# Patient Record
Sex: Female | Born: 1950 | Race: Black or African American | Hispanic: No | Marital: Single | Smoking: Former smoker
Health system: Southern US, Community
[De-identification: ages and names within clinical notes are randomized; demographics above are authoritative.]

## PROBLEM LIST (undated history)

## (undated) DIAGNOSIS — K219 Gastro-esophageal reflux disease without esophagitis: Secondary | ICD-10-CM

## (undated) DIAGNOSIS — H2511 Age-related nuclear cataract, right eye: Secondary | ICD-10-CM

## (undated) DIAGNOSIS — I10 Essential (primary) hypertension: Secondary | ICD-10-CM

## (undated) DIAGNOSIS — Z720 Tobacco use: Secondary | ICD-10-CM

## (undated) DIAGNOSIS — E7800 Pure hypercholesterolemia, unspecified: Secondary | ICD-10-CM

## (undated) DIAGNOSIS — G629 Polyneuropathy, unspecified: Secondary | ICD-10-CM

## (undated) DIAGNOSIS — N189 Chronic kidney disease, unspecified: Secondary | ICD-10-CM

## (undated) DIAGNOSIS — K5790 Diverticulosis of intestine, part unspecified, without perforation or abscess without bleeding: Secondary | ICD-10-CM

## (undated) DIAGNOSIS — I96 Gangrene, not elsewhere classified: Secondary | ICD-10-CM

## (undated) DIAGNOSIS — I82409 Acute embolism and thrombosis of unspecified deep veins of unspecified lower extremity: Secondary | ICD-10-CM

## (undated) DIAGNOSIS — Z959 Presence of cardiac and vascular implant and graft, unspecified: Secondary | ICD-10-CM

## (undated) DIAGNOSIS — C189 Malignant neoplasm of colon, unspecified: Secondary | ICD-10-CM

## (undated) DIAGNOSIS — C20 Malignant neoplasm of rectum: Secondary | ICD-10-CM

## (undated) DIAGNOSIS — I70229 Atherosclerosis of native arteries of extremities with rest pain, unspecified extremity: Secondary | ICD-10-CM

## (undated) DIAGNOSIS — D649 Anemia, unspecified: Secondary | ICD-10-CM

## (undated) DIAGNOSIS — E119 Type 2 diabetes mellitus without complications: Secondary | ICD-10-CM

## (undated) DIAGNOSIS — E039 Hypothyroidism, unspecified: Secondary | ICD-10-CM

## (undated) DIAGNOSIS — T7840XA Allergy, unspecified, initial encounter: Secondary | ICD-10-CM

## (undated) DIAGNOSIS — I739 Peripheral vascular disease, unspecified: Secondary | ICD-10-CM

## (undated) DIAGNOSIS — I998 Other disorder of circulatory system: Secondary | ICD-10-CM

## (undated) DIAGNOSIS — M1990 Unspecified osteoarthritis, unspecified site: Secondary | ICD-10-CM

## (undated) DIAGNOSIS — I509 Heart failure, unspecified: Secondary | ICD-10-CM

## (undated) DIAGNOSIS — D689 Coagulation defect, unspecified: Secondary | ICD-10-CM

## (undated) HISTORY — DX: Tobacco use: Z72.0

## (undated) HISTORY — DX: Allergy, unspecified, initial encounter: T78.40XA

## (undated) HISTORY — PX: HERNIA REPAIR: SHX51

## (undated) HISTORY — DX: Other disorder of circulatory system: I99.8

## (undated) HISTORY — DX: Atherosclerosis of native arteries of extremities with rest pain, unspecified extremity: I70.229

## (undated) HISTORY — PX: TOE SURGERY: SHX1073

## (undated) HISTORY — PX: COLONOSCOPY: SHX174

## (undated) HISTORY — DX: Malignant neoplasm of colon, unspecified: C18.9

## (undated) HISTORY — DX: Essential (primary) hypertension: I10

## (undated) HISTORY — PX: EYE SURGERY: SHX253

## (undated) HISTORY — DX: Coagulation defect, unspecified: D68.9

---

## 1997-04-30 ENCOUNTER — Other Ambulatory Visit: Admission: RE | Admit: 1997-04-30 | Discharge: 1997-04-30 | Payer: Self-pay | Admitting: Internal Medicine

## 1997-07-22 ENCOUNTER — Inpatient Hospital Stay (HOSPITAL_COMMUNITY): Admission: RE | Admit: 1997-07-22 | Discharge: 1997-07-24 | Payer: Self-pay

## 1998-01-10 HISTORY — PX: UMBILICAL HERNIA REPAIR: SHX196

## 1998-01-10 HISTORY — PX: ABDOMINAL HYSTERECTOMY: SHX81

## 1998-08-13 ENCOUNTER — Other Ambulatory Visit: Admission: RE | Admit: 1998-08-13 | Discharge: 1998-08-13 | Payer: Self-pay | Admitting: Obstetrics and Gynecology

## 1999-08-20 ENCOUNTER — Other Ambulatory Visit: Admission: RE | Admit: 1999-08-20 | Discharge: 1999-08-20 | Payer: Self-pay | Admitting: Obstetrics and Gynecology

## 2000-09-04 ENCOUNTER — Other Ambulatory Visit: Admission: RE | Admit: 2000-09-04 | Discharge: 2000-09-04 | Payer: Self-pay | Admitting: Obstetrics and Gynecology

## 2001-06-05 ENCOUNTER — Encounter: Admission: RE | Admit: 2001-06-05 | Discharge: 2001-09-03 | Payer: Self-pay | Admitting: Internal Medicine

## 2005-08-18 ENCOUNTER — Encounter: Admission: RE | Admit: 2005-08-18 | Discharge: 2005-08-18 | Payer: Self-pay | Admitting: Internal Medicine

## 2008-02-27 ENCOUNTER — Encounter: Admission: RE | Admit: 2008-02-27 | Discharge: 2008-02-27 | Payer: Self-pay | Admitting: Internal Medicine

## 2009-04-02 ENCOUNTER — Encounter: Admission: RE | Admit: 2009-04-02 | Discharge: 2009-04-02 | Payer: Self-pay | Admitting: Internal Medicine

## 2009-06-09 ENCOUNTER — Ambulatory Visit: Payer: Self-pay | Admitting: Vascular Surgery

## 2009-06-23 ENCOUNTER — Ambulatory Visit (HOSPITAL_COMMUNITY): Admission: RE | Admit: 2009-06-23 | Discharge: 2009-06-23 | Payer: Self-pay | Admitting: Surgery

## 2010-03-03 ENCOUNTER — Other Ambulatory Visit: Payer: Self-pay | Admitting: Internal Medicine

## 2010-03-03 DIAGNOSIS — Z1231 Encounter for screening mammogram for malignant neoplasm of breast: Secondary | ICD-10-CM

## 2010-03-29 LAB — POCT I-STAT, CHEM 8
BUN: 19 mg/dL (ref 6–23)
Calcium, Ion: 1.19 mmol/L (ref 1.12–1.32)
Chloride: 97 mEq/L (ref 96–112)
Creatinine, Ser: 0.8 mg/dL (ref 0.4–1.2)
Glucose, Bld: 507 mg/dL — ABNORMAL HIGH (ref 70–99)
HCT: 44 % (ref 36.0–46.0)
Hemoglobin: 15 g/dL (ref 12.0–15.0)
Potassium: 4.3 mEq/L (ref 3.5–5.1)
Sodium: 133 mEq/L — ABNORMAL LOW (ref 135–145)
TCO2: 30 mmol/L (ref 0–100)

## 2010-03-29 LAB — GLUCOSE, CAPILLARY: Glucose-Capillary: 401 mg/dL — ABNORMAL HIGH (ref 70–99)

## 2010-04-06 ENCOUNTER — Ambulatory Visit
Admission: RE | Admit: 2010-04-06 | Discharge: 2010-04-06 | Disposition: A | Discharge status: Home / Self Care | Payer: PRIVATE HEALTH INSURANCE | Source: Ambulatory Visit | Attending: Internal Medicine | Admitting: Internal Medicine

## 2010-04-06 DIAGNOSIS — Z1231 Encounter for screening mammogram for malignant neoplasm of breast: Secondary | ICD-10-CM

## 2010-05-25 NOTE — Consult Note (Signed)
NEW PATIENT CONSULTATION   Nicole, Sellers  DOB:  [birth year]                                       06/09/2009  [id number]   Nicole Sellers is a 60 year old female referred for lower extremity  occlusive disease.  She complains of bilateral calf claudication  symptoms after walking about one-quarter the patient one half block.  She also complains of discomfort in her feet which occurs frequently  have rest and is worse during the night.  She has no numbness in her  feet.  She describes her feet as an aching discomfort.  It seems to be  unassociated with claudication symptoms in her calf which limits her  ambulation.  She has no history of gangrene, infection or nonhealing  ulcers.   Chronic medical problems:  1. Type 2 diabetes mellitus.  2. Hypertension.  3. Hyperlipidemia.  4. Hypothyroidism.  5. Negative for coronary artery disease, COPD or stroke.   FAMILY HISTORY:  Positive for stroke in mother, bilateral lower  extremity amputations from lower extremity occlusive disease in mother,  diabetes in a brother and an aunt and coronary artery disease in her  father.   SOCIAL HISTORY:  She is single, has 1 child, works as a Quarry manager at [employer].  She has not smoked since 2006 but did smoke a pack a day for of  30+ years.  Does not use alcohol.   REVIEW OF SYSTEMS:  Positive for occasional dyspnea on exertion,  palpitations, chronic bronchitis, productive cough, occasional wheezing.  Does have dysphagia, chronic constipation and has had dizziness,  headaches but no as hemiparesis or TIAs.  Does have arthritis, joint  pain, sore throat and occasional change in her vision.  All other  systems in review of systems are negative.   PHYSICAL EXAMINATION:  Blood pressure 153/98, heart rate 93,  respirations 14.  Generally, this well-developed, well-nourished female in no apparent  distress, alert and oriented x3.  HEENT:  Exam is normal.  EOMs intact.  LUNGS:  Clear to auscultation.  No wheezing.  CARDIOVASCULAR:  Regular rhythm.  No murmurs.  Carotid pulses 3+ with no  audible bruits.  ABDOMEN:  Soft, nontender with no masses.  MUSCULOSKELETAL:  Exam reveals no major deformities.  NEUROLOGIC:  Exam is normal.  SKIN:  Free of rashes.  Lower extremity exam reveals 3+ femoral, 3+ dorsalis pedis pulse on the  right, 3+ femoral and 2+ dorsalis pedis pulse on the left.  She has some  varicosities along the greater saphenous system of the left leg and the  anterior thigh.  No hyperpigmentation or ulceration is noted.   Today I reviewed all of the medical records provided by Dr. Willey Blade from an evaluation performed in March of this year.  I also  reviewed a vascular lab study performed by [facility] April 12 of  this year.  This revealed ABI of 0.85 on the right, 0.65 on the left  secondary to what appears to be a superficial femoral stenosis  bilaterally.   I discussed with Nicole Sellers the fact that her foot pain and nocturnal  discomfort in the feet is not associated with her vascular disease.  Her  calf claudication is associated with her vascular disease and I think  would be amenable with PTA and stenting of her superficial femoral  arteries.  She is interested in pursuing this and we have scheduled her  for an angiogram with Dr. Trula Slade on June 14 for possible PTA and  stenting of both superficial femoral arteries in a staged fashion if  necessary.     Nicole Sellers, M.D.  Electronically Signed   JDL/MEDQ  D:  06/09/2009  T:  06/10/2009  Job:  [id number]

## 2011-04-05 ENCOUNTER — Other Ambulatory Visit: Payer: Self-pay | Admitting: Internal Medicine

## 2011-04-05 DIAGNOSIS — Z1231 Encounter for screening mammogram for malignant neoplasm of breast: Secondary | ICD-10-CM

## 2011-04-19 ENCOUNTER — Ambulatory Visit
Admission: RE | Admit: 2011-04-19 | Discharge: 2011-04-19 | Disposition: A | Discharge status: Home / Self Care | Payer: PRIVATE HEALTH INSURANCE | Source: Ambulatory Visit | Attending: Internal Medicine | Admitting: Internal Medicine

## 2011-04-19 DIAGNOSIS — Z1231 Encounter for screening mammogram for malignant neoplasm of breast: Secondary | ICD-10-CM

## 2012-10-09 ENCOUNTER — Other Ambulatory Visit: Payer: Self-pay | Admitting: Otolaryngology

## 2012-10-09 DIAGNOSIS — K219 Gastro-esophageal reflux disease without esophagitis: Secondary | ICD-10-CM

## 2012-10-09 DIAGNOSIS — R1310 Dysphagia, unspecified: Secondary | ICD-10-CM

## 2012-10-15 ENCOUNTER — Ambulatory Visit
Admission: RE | Admit: 2012-10-15 | Discharge: 2012-10-15 | Disposition: A | Discharge status: Home / Self Care | Payer: PRIVATE HEALTH INSURANCE | Source: Ambulatory Visit | Attending: Otolaryngology | Admitting: Otolaryngology

## 2012-10-15 ENCOUNTER — Ambulatory Visit
Admission: RE | Admit: 2012-10-15 | Discharge: 2012-10-15 | Disposition: A | Discharge status: Home / Self Care | Payer: PRIVATE HEALTH INSURANCE | Source: Ambulatory Visit | Attending: Internal Medicine | Admitting: Internal Medicine

## 2012-10-15 ENCOUNTER — Other Ambulatory Visit: Payer: Self-pay | Admitting: Internal Medicine

## 2012-10-15 DIAGNOSIS — R1310 Dysphagia, unspecified: Secondary | ICD-10-CM

## 2012-10-15 DIAGNOSIS — R05 Cough: Secondary | ICD-10-CM

## 2012-10-15 DIAGNOSIS — K219 Gastro-esophageal reflux disease without esophagitis: Secondary | ICD-10-CM

## 2012-10-15 DIAGNOSIS — R059 Cough, unspecified: Secondary | ICD-10-CM

## 2012-10-22 ENCOUNTER — Other Ambulatory Visit: Payer: Self-pay | Admitting: Internal Medicine

## 2012-10-22 ENCOUNTER — Other Ambulatory Visit: Payer: Self-pay

## 2012-10-22 DIAGNOSIS — Z1231 Encounter for screening mammogram for malignant neoplasm of breast: Secondary | ICD-10-CM

## 2012-11-09 ENCOUNTER — Encounter: Payer: Self-pay | Admitting: Gastroenterology

## 2012-11-12 ENCOUNTER — Ambulatory Visit
Admission: RE | Admit: 2012-11-12 | Discharge: 2012-11-12 | Disposition: A | Discharge status: Home / Self Care | Payer: PRIVATE HEALTH INSURANCE | Source: Ambulatory Visit | Attending: Internal Medicine | Admitting: Internal Medicine

## 2012-11-12 DIAGNOSIS — Z1231 Encounter for screening mammogram for malignant neoplasm of breast: Secondary | ICD-10-CM

## 2012-11-23 ENCOUNTER — Ambulatory Visit: Payer: PRIVATE HEALTH INSURANCE | Admitting: Gastroenterology

## 2012-12-13 ENCOUNTER — Encounter: Payer: Self-pay | Admitting: Cardiovascular Disease

## 2012-12-13 ENCOUNTER — Ambulatory Visit (INDEPENDENT_AMBULATORY_CARE_PROVIDER_SITE_OTHER): Payer: PRIVATE HEALTH INSURANCE | Admitting: Cardiovascular Disease

## 2012-12-13 ENCOUNTER — Ambulatory Visit (HOSPITAL_COMMUNITY)
Admission: RE | Admit: 2012-12-13 | Discharge: 2012-12-13 | Disposition: A | Discharge status: Home / Self Care | Payer: PRIVATE HEALTH INSURANCE | Source: Ambulatory Visit | Attending: Cardiovascular Disease | Admitting: Cardiovascular Disease

## 2012-12-13 VITALS — BP 161/88 | HR 89 | Ht 66.5 in | Wt 167.0 lb

## 2012-12-13 DIAGNOSIS — Z79899 Other long term (current) drug therapy: Secondary | ICD-10-CM

## 2012-12-13 DIAGNOSIS — Z01818 Encounter for other preprocedural examination: Secondary | ICD-10-CM

## 2012-12-13 DIAGNOSIS — E119 Type 2 diabetes mellitus without complications: Secondary | ICD-10-CM | POA: Insufficient documentation

## 2012-12-13 DIAGNOSIS — I739 Peripheral vascular disease, unspecified: Secondary | ICD-10-CM

## 2012-12-13 DIAGNOSIS — Z720 Tobacco use: Secondary | ICD-10-CM | POA: Insufficient documentation

## 2012-12-13 DIAGNOSIS — R5381 Other malaise: Secondary | ICD-10-CM

## 2012-12-13 DIAGNOSIS — R0989 Other specified symptoms and signs involving the circulatory and respiratory systems: Secondary | ICD-10-CM

## 2012-12-13 DIAGNOSIS — I998 Other disorder of circulatory system: Secondary | ICD-10-CM

## 2012-12-13 DIAGNOSIS — L97509 Non-pressure chronic ulcer of other part of unspecified foot with unspecified severity: Secondary | ICD-10-CM | POA: Insufficient documentation

## 2012-12-13 DIAGNOSIS — I70229 Atherosclerosis of native arteries of extremities with rest pain, unspecified extremity: Secondary | ICD-10-CM

## 2012-12-13 DIAGNOSIS — I70269 Atherosclerosis of native arteries of extremities with gangrene, unspecified extremity: Secondary | ICD-10-CM | POA: Insufficient documentation

## 2012-12-13 DIAGNOSIS — D689 Coagulation defect, unspecified: Secondary | ICD-10-CM

## 2012-12-13 DIAGNOSIS — I10 Essential (primary) hypertension: Secondary | ICD-10-CM

## 2012-12-13 DIAGNOSIS — R5383 Other fatigue: Secondary | ICD-10-CM

## 2012-12-13 DIAGNOSIS — I70219 Atherosclerosis of native arteries of extremities with intermittent claudication, unspecified extremity: Secondary | ICD-10-CM

## 2012-12-13 NOTE — Assessment & Plan Note (Signed)
Controlled on current medications 

## 2012-12-13 NOTE — Progress Notes (Signed)
Arteria Duplexl Lower Ext. Completed. Oda Cogan, BS, RDMS, RVT

## 2012-12-13 NOTE — Assessment & Plan Note (Signed)
Aspartate has had claudication for the last 2 or 3 years. Over the last 2-3 weeks she developed a gangrenous right second toe. She saw her podiatrist who referred her here for further evaluation. Lower extremity arterial Dopplers revealed a right ABI of 0.7 and a left of 0.46. She has high-grade right SFA disease with occluded right anterior tibial artery. Her left SFA is occluded. Based on this we'll proceed with angiography and potential endovascular therapy.

## 2012-12-13 NOTE — Progress Notes (Signed)
12/13/2012 Nicole Sellers   [birth year]  [id number]  Primary Physician Salena Saner., MD Primary Cardiologist: Nicole Harp MD Renae Gloss   HPI:  Nicole Sellers is a 62 year old mildly overweight towards African American female mother of one child and works as a Quarry manager. She was referred by Dr.Ajlouny from [facility] podiatry for evaluation of critical limb ischemia.her risk factors include a 25-50-pack-year history of tobacco abuse smoking one half pack per day. She has history of non-insulin-requiring diabetes untreated hypertension. She's never had a heart attack or stroke. She complains of dyspnea but denies chest pain. She's had claudication of less than 3 years which is lifestyle limiting and a gangrenous right second toe of the last 2-3 weeks. Dopplers her office suggested high-grade right SFA disease with an occluded anterior tibial as was the occluded left SFA. Pencil B. To proceed with angiography and potential intervention.   Current Outpatient Prescriptions  Medication Sig Dispense Refill  . aspirin 81 MG tablet Take 81 mg by mouth daily.      . cholecalciferol (VITAMIN D) 400 UNITS TABS tablet Take 400 Units by mouth daily.      . Glucosamine-Chondroitin (OSTEO BI-FLEX REGULAR STRENGTH PO) Take by mouth.      . levothyroxine (SYNTHROID, LEVOTHROID) 100 MCG tablet Take 100 mcg by mouth daily before breakfast. UNSURE of DOSE      . metFORMIN (GLUCOPHAGE) 500 MG tablet Take 1,000 mg by mouth 2 (two) times daily with a meal.      . Multiple Vitamin (MULTIVITAMIN) capsule Take 1 capsule by mouth daily.      Marland Kitchen triamterene-hydrochlorothiazide (DYAZIDE) 37.5-25 MG per capsule Take 1 capsule by mouth daily.       No current facility-administered medications for this visit.    No Known Allergies  History   Social History  . Marital Status: Single    Spouse Name: N/A    Number of Children: N/A  . Years of Education: N/A   Occupational History  . Not on file.     Social History Main Topics  . Smoking status: Current Every Day Smoker -- 0.50 packs/day  . Smokeless tobacco: Not on file  . Alcohol Use: Not on file  . Drug Use: Not on file  . Sexual Activity: Not on file   Other Topics Concern  . Not on file   Social History Narrative  . No narrative on file     Review of Systems: General: negative for chills, fever, night sweats or weight changes.  Cardiovascular: negative for chest pain, dyspnea on exertion, edema, orthopnea, palpitations, paroxysmal nocturnal dyspnea or shortness of breath Dermatological: negative for rash Respiratory: negative for cough or wheezing Urologic: negative for hematuria Abdominal: negative for nausea, vomiting, diarrhea, bright red blood per rectum, melena, or hematemesis Neurologic: negative for visual changes, syncope, or dizziness All other systems reviewed and are otherwise negative except as noted above.    Blood pressure 161/88, pulse 89, height 5' 6.5" (1.689 m), weight 167 lb (75.751 kg).  General appearance: alert and no distress Neck: no adenopathy, no JVD, supple, symmetrical, trachea midline, thyroid not enlarged, symmetric, no tenderness/mass/nodules and left carotid bruit Lungs: clear to auscultation bilaterally Heart: regular rate and rhythm, S1, S2 normal, no murmur, click, rub or gallop Extremities: extremities normal, atraumatic, no cyanosis or edema and 2+ femorals with bruits bilaterally. Absent pedal pulses Pulses: 2+ and symmetric absent pedal pulses  EKG not performed today  ASSESSMENT AND PLAN:   Critical lower  limb ischemia Aspartate has had claudication for the last 2 or 3 years. Over the last 2-3 weeks she developed a gangrenous right second toe. She saw her podiatrist who referred her here for further evaluation. Lower extremity arterial Dopplers revealed a right ABI of 0.7 and a left of 0.46. She has high-grade right SFA disease with occluded right anterior tibial artery. Her  left SFA is occluded. Based on this we'll proceed with angiography and potential endovascular therapy.  Essential hypertension Controlled on current medications      Nicole Harp MD [facility], [facility] 12/13/2012 4:36 PM

## 2012-12-13 NOTE — Patient Instructions (Signed)
Dr. Gwenlyn Found has ordered a peripheral angiogram to be done at [facility].  This procedure is going to look at the bloodflow in your lower extremities.  If Dr. Gwenlyn Found is able to open up the arteries, you will have to spend one night in the hospital.  If he is not able to open the arteries, you will be able to go home that same day.    After the procedure, you will not be allowed to drive for 3 days or push, pull, or lift anything greater than 10 lbs for one week.    You will be required to have bloodwork prior to your procedure.  Our scheduler will advise you on when these items need to be done.  Left Groin  Dr Gwenlyn Found has ordered carotid dopplers to be done.

## 2012-12-14 LAB — CBC
HCT: 36.3 % (ref 36.0–46.0)
Hemoglobin: 12.7 g/dL (ref 12.0–15.0)
MCH: 31 pg (ref 26.0–34.0)
MCHC: 35 g/dL (ref 30.0–36.0)
MCV: 88.5 fL (ref 78.0–100.0)
Platelets: 408 10*3/uL — ABNORMAL HIGH (ref 150–400)
RBC: 4.1 MIL/uL (ref 3.87–5.11)
RDW: 12.9 % (ref 11.5–15.5)
WBC: 5.5 10*3/uL (ref 4.0–10.5)

## 2012-12-14 LAB — BASIC METABOLIC PANEL
BUN: 14 mg/dL (ref 6–23)
CO2: 30 mEq/L (ref 19–32)
Calcium: 10.2 mg/dL (ref 8.4–10.5)
Chloride: 97 mEq/L (ref 96–112)
Creat: 1.08 mg/dL (ref 0.50–1.10)
Glucose, Bld: 151 mg/dL — ABNORMAL HIGH (ref 70–99)
Potassium: 4.3 mEq/L (ref 3.5–5.3)
Sodium: 137 mEq/L (ref 135–145)

## 2012-12-15 LAB — TSH: TSH: 1.018 u[IU]/mL (ref 0.350–4.500)

## 2012-12-15 LAB — APTT: aPTT: 31 seconds (ref 24–37)

## 2012-12-15 LAB — PROTIME-INR
INR: 0.89 (ref <1.50)
Prothrombin Time: 12.1 seconds (ref 11.6–15.2)

## 2012-12-17 ENCOUNTER — Encounter (HOSPITAL_COMMUNITY): Payer: Self-pay | Admitting: Pharmacy Technician

## 2012-12-19 ENCOUNTER — Ambulatory Visit (HOSPITAL_COMMUNITY)
Admission: RE | Admit: 2012-12-19 | Discharge: 2012-12-19 | Disposition: A | Discharge status: Home / Self Care | Payer: PRIVATE HEALTH INSURANCE | Source: Ambulatory Visit | Attending: Cardiovascular Disease | Admitting: Cardiovascular Disease

## 2012-12-19 ENCOUNTER — Encounter: Payer: Self-pay | Admitting: *Deleted

## 2012-12-19 DIAGNOSIS — I739 Peripheral vascular disease, unspecified: Secondary | ICD-10-CM

## 2012-12-19 DIAGNOSIS — R0989 Other specified symptoms and signs involving the circulatory and respiratory systems: Secondary | ICD-10-CM | POA: Insufficient documentation

## 2012-12-19 NOTE — Progress Notes (Signed)
Carotid Duplex Completed. °Brianna L Mazza,RVT °

## 2012-12-20 ENCOUNTER — Encounter (HOSPITAL_COMMUNITY)
Admission: RE | Disposition: A | Discharge status: Home / Self Care | Payer: Self-pay | Source: Ambulatory Visit | Attending: Cardiovascular Disease

## 2012-12-20 ENCOUNTER — Ambulatory Visit (HOSPITAL_COMMUNITY)
Admission: RE | Admit: 2012-12-20 | Discharge: 2012-12-20 | Disposition: A | Discharge status: Home / Self Care | Payer: PRIVATE HEALTH INSURANCE | Source: Ambulatory Visit | Attending: Cardiovascular Disease | Admitting: Cardiovascular Disease

## 2012-12-20 DIAGNOSIS — F17200 Nicotine dependence, unspecified, uncomplicated: Secondary | ICD-10-CM | POA: Insufficient documentation

## 2012-12-20 DIAGNOSIS — E119 Type 2 diabetes mellitus without complications: Secondary | ICD-10-CM | POA: Insufficient documentation

## 2012-12-20 DIAGNOSIS — I998 Other disorder of circulatory system: Secondary | ICD-10-CM

## 2012-12-20 DIAGNOSIS — I10 Essential (primary) hypertension: Secondary | ICD-10-CM | POA: Insufficient documentation

## 2012-12-20 DIAGNOSIS — I70219 Atherosclerosis of native arteries of extremities with intermittent claudication, unspecified extremity: Secondary | ICD-10-CM

## 2012-12-20 DIAGNOSIS — I70269 Atherosclerosis of native arteries of extremities with gangrene, unspecified extremity: Secondary | ICD-10-CM | POA: Insufficient documentation

## 2012-12-20 DIAGNOSIS — Z7982 Long term (current) use of aspirin: Secondary | ICD-10-CM | POA: Insufficient documentation

## 2012-12-20 DIAGNOSIS — Z79899 Other long term (current) drug therapy: Secondary | ICD-10-CM | POA: Insufficient documentation

## 2012-12-20 DIAGNOSIS — Z01818 Encounter for other preprocedural examination: Secondary | ICD-10-CM

## 2012-12-20 DIAGNOSIS — I70229 Atherosclerosis of native arteries of extremities with rest pain, unspecified extremity: Secondary | ICD-10-CM

## 2012-12-20 HISTORY — PX: LOWER EXTREMITY ANGIOGRAM: SHX5508

## 2012-12-20 HISTORY — PX: ABDOMINAL AORTAGRAM: SHX5454

## 2012-12-20 LAB — GLUCOSE, CAPILLARY
Glucose-Capillary: 153 mg/dL — ABNORMAL HIGH (ref 70–99)
Glucose-Capillary: 120 mg/dL — ABNORMAL HIGH (ref 70–99)

## 2012-12-20 SURGERY — ANGIOGRAM, LOWER EXTREMITY
Anesthesia: LOCAL

## 2012-12-20 MED ORDER — FENTANYL CITRATE 0.05 MG/ML IJ SOLN
INTRAMUSCULAR | Status: AC
  Filled 2012-12-20: qty 2

## 2012-12-20 MED ORDER — LIDOCAINE HCL (PF) 1 % IJ SOLN
INTRAMUSCULAR | Status: AC
  Filled 2012-12-20: qty 30

## 2012-12-20 MED ORDER — DIAZEPAM 5 MG PO TABS
5.0000 mg | ORAL_TABLET | ORAL | Status: AC
  Administered 2012-12-20: 5 mg via ORAL

## 2012-12-20 MED ORDER — DIAZEPAM 5 MG PO TABS
ORAL_TABLET | ORAL | Status: AC
  Filled 2012-12-20: qty 1

## 2012-12-20 MED ORDER — ACETAMINOPHEN 325 MG PO TABS: 650.0000 mg | ORAL_TABLET | ORAL | Status: DC | PRN

## 2012-12-20 MED ORDER — MIDAZOLAM HCL 2 MG/2ML IJ SOLN
INTRAMUSCULAR | Status: AC
  Filled 2012-12-20: qty 2

## 2012-12-20 MED ORDER — ONDANSETRON HCL 4 MG/2ML IJ SOLN: 4.0000 mg | Freq: Four times a day (QID) | INTRAMUSCULAR | Status: DC | PRN

## 2012-12-20 MED ORDER — SODIUM CHLORIDE 0.9 % IV SOLN
INTRAVENOUS | Status: DC
  Administered 2012-12-20: 1000 mL via INTRAVENOUS

## 2012-12-20 MED ORDER — SODIUM CHLORIDE 0.9 % IV SOLN: INTRAVENOUS | Status: DC

## 2012-12-20 MED ORDER — SODIUM CHLORIDE 0.9 % IJ SOLN: 3.0000 mL | INTRAMUSCULAR | Status: DC | PRN

## 2012-12-20 MED ORDER — MORPHINE SULFATE 2 MG/ML IJ SOLN: 1.0000 mg | INTRAMUSCULAR | Status: DC | PRN

## 2012-12-20 MED ORDER — ASPIRIN 81 MG PO CHEW
81.0000 mg | CHEWABLE_TABLET | ORAL | Status: AC
  Administered 2012-12-20: 81 mg via ORAL

## 2012-12-20 MED ORDER — HEPARIN (PORCINE) IN NACL 2-0.9 UNIT/ML-% IJ SOLN
INTRAMUSCULAR | Status: AC
Start: 2012-12-20 — End: 2012-12-20
  Filled 2012-12-20: qty 1000

## 2012-12-20 MED ORDER — HYDRALAZINE HCL 20 MG/ML IJ SOLN
INTRAMUSCULAR | Status: AC
  Administered 2012-12-20: 10 mg via INTRAVENOUS
  Filled 2012-12-20: qty 1

## 2012-12-20 MED ORDER — HYDRALAZINE HCL 20 MG/ML IJ SOLN
10.0000 mg | Freq: Once | INTRAMUSCULAR | Status: AC
  Administered 2012-12-20: 10 mg via INTRAVENOUS

## 2012-12-20 MED ORDER — ASPIRIN 81 MG PO CHEW
CHEWABLE_TABLET | ORAL | Status: AC
  Filled 2012-12-20: qty 1

## 2012-12-20 NOTE — H&P (View-Only) (Signed)
12/13/2012 Carrington Clamp   05/06/1950  [id number]  Primary Physician Salena Saner., MD Primary Cardiologist: Lorretta Harp MD Renae Gloss   HPI:  Ms. 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 She has history of non-insulin-requiring diabetes untreated hypertension. She's never had a heart attack or stroke. She complains of dyspnea but denies chest pain. She's had claudication of less than 3 years which is lifestyle limiting and a gangrenous right second toe of the last 2-3 weeks. Dopplers her office suggested high-grade right SFA disease with an occluded anterior tibial as was the occluded left SFA. Pencil B. To proceed with angiography and potential intervention.   Current Outpatient Prescriptions  Medication Sig Dispense Refill  . aspirin 81 MG tablet Take 81 mg by mouth daily.      . cholecalciferol (VITAMIN D) 400 UNITS TABS tablet Take 400 Units by mouth daily.      . Glucosamine-Chondroitin (OSTEO BI-FLEX REGULAR STRENGTH PO) Take by mouth.      . levothyroxine (SYNTHROID, LEVOTHROID) 100 MCG tablet Take 100 mcg by mouth daily before breakfast. UNSURE of DOSE      . metFORMIN (GLUCOPHAGE) 500 MG tablet Take 1,000 mg by mouth 2 (two) times daily with a meal.      . Multiple Vitamin (MULTIVITAMIN) capsule Take 1 capsule by mouth daily.      Marland Kitchen triamterene-hydrochlorothiazide (DYAZIDE) 37.5-25 MG per capsule Take 1 capsule by mouth daily.       No current facility-administered medications for this visit.    No Known Allergies  History   Social History  . Marital Status: Single    Spouse Name: N/A    Number of Children: N/A  . Years of Education: N/A   Occupational History  . Not on file.     Social History Main Topics  . Smoking status: Current Every Day Smoker -- 0.50 packs/day  . Smokeless tobacco: Not on file  . Alcohol Use: Not on file  . Drug Use: Not on file  . Sexual Activity: Not on file   Other Topics Concern  . Not on file   Social History Narrative  . No narrative on file     Review of Systems: General: negative for chills, fever, night sweats or weight changes.  Cardiovascular: negative for chest pain, dyspnea on exertion, edema, orthopnea, palpitations, paroxysmal nocturnal dyspnea or shortness of breath Dermatological: negative for rash Respiratory: negative for cough or wheezing Urologic: negative for hematuria Abdominal: negative for nausea, vomiting, diarrhea, bright red blood per rectum, melena, or hematemesis Neurologic: negative for visual changes, syncope, or dizziness All other systems reviewed and are otherwise negative except as noted above.    Blood pressure 161/88, pulse 89, height 5' 6.5" (1.689 m), weight 167 lb (75.751 kg).  General appearance: alert and no distress Neck: no adenopathy, no JVD, supple, symmetrical, trachea midline, thyroid not enlarged, symmetric, no tenderness/mass/nodules and left carotid bruit Lungs: clear to auscultation bilaterally Heart: regular rate and rhythm, S1, S2 normal, no murmur, click, rub or gallop Extremities: extremities normal, atraumatic, no cyanosis or edema and 2+ femorals with bruits bilaterally. Absent pedal pulses Pulses: 2+ and symmetric absent pedal pulses  EKG not performed today  ASSESSMENT AND PLAN:   Critical lower  limb ischemia Aspartate has had claudication for the last 2 or 3 years. Over the last 2-3 weeks she developed a gangrenous right second toe. She saw her podiatrist who referred her here for further evaluation. Lower extremity arterial Dopplers revealed a right ABI of 0.7 and a left of 0.46. She has high-grade right SFA disease with occluded right anterior tibial artery. Her  left SFA is occluded. Based on this we'll proceed with angiography and potential endovascular therapy.  Essential hypertension Controlled on current medications      Lorretta Harp MD [facility], [facility] 12/13/2012 4:36 PM

## 2012-12-20 NOTE — Op Note (Signed)
Nicole Sellers is a 62 y.o. female    [id number] LOCATION:  FACILITY: [facility]  PHYSICIAN: Quay Burow, M.D. [birth year]   DATE OF PROCEDURE:  12/20/2012  DATE OF DISCHARGE:     PV Angiogram/Intervention    History obtained from chart review.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 She has history of non-insulin-requiring diabetes untreated hypertension. She's never had a heart attack or stroke. She complains of dyspnea but denies chest pain. She's had claudication of less than 3 years which is lifestyle limiting and a gangrenous right second toe of the last 2-3 weeks. Dopplers her office suggested high-grade right SFA disease with an occluded anterior tibial as was the occluded left SFA. The plan was to proceed with angiography and potential intervention for  critical limb ischemia    PROCEDURE DESCRIPTION:   The patient was brought to the second floor [facility] cath lab in the postabsorptive state. She was premedicated with Valium 5 mg by mouth, IV Versed and fentanyl. Her left groinwas prepped and shaved in usual sterile fashion. Xylocaine 1% was used for local anesthesia. A 5 French sheath was inserted into the left common femoral artery using standard Seldinger technique.a 5 French pigtail catheter was used for distal abdominal aortography, bilateral iliac angiography, and bifemoral runoff. A 5 French cross over  catheter and endhole catheter were used to selectively angiogram the tibial bifurcation and the pedal vessels. Visipaque was used for the entirety of the case. Retrograde aortic pressure was monitored during the case. A total of 180 cc of contrast was administered to the patient.   HEMODYNAMICS:    AO SYSTOLIC/AO DIASTOLIC: [id number]   Angiographic Data:   1: Abdominal aortogram-fluoroscopically calcified without evidence of atherosclerosis or aneurysmal dilatation  2: Left lower extremity-occluded left SFA with reconstitution in the adductor canal by profunda femoris collaterals. There was two-vessel runoff with occluded anterior tibial, small peroneal with prominent posterior tibialis.  3: Right lower extremity-80% segmental proximal right SFA, 90% segmental calcified mid right SFA with essentially one-vessel runoff via the posterior tibial. The dorsal pedal arch crossfield by this vessel on anterior lateral foot films  IMPRESSION:critical limb ischemia with high-grade calcified segmental mid right SFA stenosis and one-vessel runoff. She did have 180 cc of contrast. The sheath was removed and pressure was held on the groin to achieve hemostasis in the lab. She left the lab in stable condition and will be hydrated for 4 hours, and discharged home. Plans will be to readmit her on Tuesday for staged diamondback atherectomy, PTA and stenting of the mid right SFA with distal protection because of her limited runoff.    Lorretta Harp MD, [facility] 12/20/2012 10:01 AM

## 2012-12-20 NOTE — Progress Notes (Signed)
Received pt from Laverle Hobby, RN. Assessment documented.

## 2012-12-20 NOTE — Interval H&P Note (Signed)
History and Physical Interval Note:  12/20/2012 9:05 AM  Carrington Clamp  has presented today for surgery, with the diagnosis of Ckaudication  The various methods of treatment have been discussed with the patient and family. After consideration of risks, benefits and other options for treatment, the patient has consented to  Procedure(s): LOWER EXTREMITY ANGIOGRAM (N/A) as a surgical intervention .  The patient's history has been reviewed, patient examined, no change in status, stable for surgery.  I have reviewed the patient's chart and labs.  Questions were answered to the patient's satisfaction.     Lorretta Harp

## 2012-12-24 ENCOUNTER — Encounter: Payer: Self-pay | Admitting: *Deleted

## 2012-12-24 ENCOUNTER — Other Ambulatory Visit: Payer: Self-pay | Admitting: *Deleted

## 2012-12-24 DIAGNOSIS — Z01818 Encounter for other preprocedural examination: Secondary | ICD-10-CM

## 2012-12-25 ENCOUNTER — Encounter (HOSPITAL_COMMUNITY): Payer: Self-pay | Admitting: General Practice

## 2012-12-25 ENCOUNTER — Encounter (HOSPITAL_COMMUNITY)
Admission: RE | Disposition: A | Discharge status: Home / Self Care | Payer: Self-pay | Source: Ambulatory Visit | Attending: Cardiovascular Disease

## 2012-12-25 ENCOUNTER — Ambulatory Visit (HOSPITAL_COMMUNITY)
Admission: RE | Admit: 2012-12-25 | Discharge: 2012-12-26 | Disposition: A | Discharge status: Home / Self Care | Payer: PRIVATE HEALTH INSURANCE | Source: Ambulatory Visit | Attending: Cardiovascular Disease | Admitting: Cardiovascular Disease

## 2012-12-25 DIAGNOSIS — I96 Gangrene, not elsewhere classified: Secondary | ICD-10-CM

## 2012-12-25 DIAGNOSIS — I70229 Atherosclerosis of native arteries of extremities with rest pain, unspecified extremity: Secondary | ICD-10-CM | POA: Diagnosis present

## 2012-12-25 DIAGNOSIS — I70269 Atherosclerosis of native arteries of extremities with gangrene, unspecified extremity: Secondary | ICD-10-CM | POA: Insufficient documentation

## 2012-12-25 DIAGNOSIS — Z720 Tobacco use: Secondary | ICD-10-CM | POA: Diagnosis present

## 2012-12-25 DIAGNOSIS — I998 Other disorder of circulatory system: Secondary | ICD-10-CM | POA: Diagnosis present

## 2012-12-25 DIAGNOSIS — Z87891 Personal history of nicotine dependence: Secondary | ICD-10-CM | POA: Insufficient documentation

## 2012-12-25 DIAGNOSIS — I739 Peripheral vascular disease, unspecified: Secondary | ICD-10-CM | POA: Insufficient documentation

## 2012-12-25 DIAGNOSIS — I10 Essential (primary) hypertension: Secondary | ICD-10-CM

## 2012-12-25 DIAGNOSIS — I70219 Atherosclerosis of native arteries of extremities with intermittent claudication, unspecified extremity: Secondary | ICD-10-CM

## 2012-12-25 DIAGNOSIS — Z959 Presence of cardiac and vascular implant and graft, unspecified: Secondary | ICD-10-CM

## 2012-12-25 DIAGNOSIS — Z01818 Encounter for other preprocedural examination: Secondary | ICD-10-CM

## 2012-12-25 DIAGNOSIS — E119 Type 2 diabetes mellitus without complications: Secondary | ICD-10-CM | POA: Insufficient documentation

## 2012-12-25 HISTORY — DX: Peripheral vascular disease, unspecified: I73.9

## 2012-12-25 HISTORY — DX: Type 2 diabetes mellitus without complications: E11.9

## 2012-12-25 HISTORY — DX: Unspecified osteoarthritis, unspecified site: M19.90

## 2012-12-25 HISTORY — PX: ATHERECTOMY: SHX5502

## 2012-12-25 HISTORY — DX: Presence of cardiac and vascular implant and graft, unspecified: Z95.9

## 2012-12-25 HISTORY — DX: Pure hypercholesterolemia, unspecified: E78.00

## 2012-12-25 HISTORY — DX: Gangrene, not elsewhere classified: I96

## 2012-12-25 HISTORY — PX: ANGIOPLASTY / STENTING FEMORAL: SUR30

## 2012-12-25 HISTORY — DX: Hypothyroidism, unspecified: E03.9

## 2012-12-25 HISTORY — DX: Gastro-esophageal reflux disease without esophagitis: K21.9

## 2012-12-25 HISTORY — PX: LOWER EXTREMITY ANGIOGRAM: SHX5508

## 2012-12-25 LAB — GLUCOSE, CAPILLARY
Glucose-Capillary: 234 mg/dL — ABNORMAL HIGH (ref 70–99)
Glucose-Capillary: 130 mg/dL — ABNORMAL HIGH (ref 70–99)
Glucose-Capillary: 185 mg/dL — ABNORMAL HIGH (ref 70–99)

## 2012-12-25 LAB — POCT ACTIVATED CLOTTING TIME
Activated Clotting Time: 221 seconds
Activated Clotting Time: 227 seconds
Activated Clotting Time: 260 seconds
Activated Clotting Time: 232 seconds
Activated Clotting Time: 238 seconds
Activated Clotting Time: 177 seconds

## 2012-12-25 LAB — BASIC METABOLIC PANEL
BUN: 13 mg/dL (ref 6–23)
CO2: 26 mEq/L (ref 19–32)
Calcium: 9.8 mg/dL (ref 8.4–10.5)
Chloride: 101 mEq/L (ref 96–112)
Creatinine, Ser: 0.78 mg/dL (ref 0.50–1.10)
GFR calc Af Amer: >90 mL/min (ref >90)
GFR calc non Af Amer: 87 mL/min — ABNORMAL LOW (ref >90)
Glucose, Bld: 229 mg/dL — ABNORMAL HIGH (ref 70–99)
Potassium: 4 mEq/L (ref 3.5–5.1)
Sodium: 137 mEq/L (ref 135–145)

## 2012-12-25 LAB — CBC
HCT: 34.4 % — ABNORMAL LOW (ref 36.0–46.0)
Hemoglobin: 12.1 g/dL (ref 12.0–15.0)
MCH: 30.9 pg (ref 26.0–34.0)
MCHC: 35.2 g/dL (ref 30.0–36.0)
MCV: 87.8 fL (ref 78.0–100.0)
Platelets: 458 10*3/uL — ABNORMAL HIGH (ref 150–400)
RBC: 3.92 MIL/uL (ref 3.87–5.11)
RDW: 12.5 % (ref 11.5–15.5)
WBC: 5.4 10*3/uL (ref 4.0–10.5)

## 2012-12-25 SURGERY — ANGIOGRAM, LOWER EXTREMITY
Anesthesia: LOCAL | Laterality: Right

## 2012-12-25 MED ORDER — CLOPIDOGREL BISULFATE 75 MG PO TABS
ORAL_TABLET | ORAL | Status: AC
  Filled 2012-12-25: qty 1

## 2012-12-25 MED ORDER — ASPIRIN 81 MG PO TABS: 81.0000 mg | ORAL_TABLET | Freq: Every day | ORAL | Status: DC

## 2012-12-25 MED ORDER — HEPARIN SODIUM (PORCINE) 1000 UNIT/ML IJ SOLN
INTRAMUSCULAR | Status: AC
  Filled 2012-12-25: qty 1

## 2012-12-25 MED ORDER — SODIUM CHLORIDE 0.9 % IV SOLN
INTRAVENOUS | Status: AC
  Administered 2012-12-25: 15:00:00 via INTRAVENOUS

## 2012-12-25 MED ORDER — LIDOCAINE HCL (PF) 1 % IJ SOLN
INTRAMUSCULAR | Status: AC
  Filled 2012-12-25: qty 30

## 2012-12-25 MED ORDER — ACETAMINOPHEN 325 MG PO TABS
650.0000 mg | ORAL_TABLET | ORAL | Status: DC | PRN
  Administered 2012-12-26: 07:00:00 650 mg via ORAL
  Filled 2012-12-25: qty 2

## 2012-12-25 MED ORDER — TRIAMTERENE-HCTZ 37.5-25 MG PO TABS
1.0000 | ORAL_TABLET | Freq: Every day | ORAL | Status: DC
  Administered 2012-12-25: 15:00:00 1 via ORAL
  Filled 2012-12-25 (×2): qty 1

## 2012-12-25 MED ORDER — SODIUM CHLORIDE 0.9 % IJ SOLN: 3.0000 mL | INTRAMUSCULAR | Status: DC | PRN

## 2012-12-25 MED ORDER — VERAPAMIL HCL 2.5 MG/ML IV SOLN
INTRAVENOUS | Status: AC
  Filled 2012-12-25: qty 2

## 2012-12-25 MED ORDER — HEPARIN (PORCINE) IN NACL 2-0.9 UNIT/ML-% IJ SOLN
INTRAMUSCULAR | Status: AC
  Filled 2012-12-25: qty 500

## 2012-12-25 MED ORDER — CLOPIDOGREL BISULFATE 300 MG PO TABS
ORAL_TABLET | ORAL | Status: AC
  Filled 2012-12-25: qty 1

## 2012-12-25 MED ORDER — ASPIRIN 81 MG PO CHEW
81.0000 mg | CHEWABLE_TABLET | ORAL | Status: AC
  Administered 2012-12-25: 81 mg via ORAL
  Filled 2012-12-25: qty 1

## 2012-12-25 MED ORDER — ASPIRIN EC 325 MG PO TBEC
325.0000 mg | DELAYED_RELEASE_TABLET | Freq: Every day | ORAL | Status: DC
  Administered 2012-12-26: 09:00:00 325 mg via ORAL
  Filled 2012-12-25 (×2): qty 1

## 2012-12-25 MED ORDER — LEVOTHYROXINE SODIUM 50 MCG PO TABS
50.0000 ug | ORAL_TABLET | Freq: Every day | ORAL | Status: DC
  Administered 2012-12-26: 07:00:00 50 ug via ORAL
  Filled 2012-12-25 (×2): qty 1

## 2012-12-25 MED ORDER — CLOPIDOGREL BISULFATE 75 MG PO TABS
75.0000 mg | ORAL_TABLET | Freq: Every day | ORAL | Status: DC
  Administered 2012-12-26: 75 mg via ORAL

## 2012-12-25 MED ORDER — HYDRALAZINE HCL 20 MG/ML IJ SOLN
10.0000 mg | INTRAMUSCULAR | Status: DC
  Administered 2012-12-25 (×2): 10 mg via INTRAVENOUS
  Filled 2012-12-25 (×3): qty 1

## 2012-12-25 MED ORDER — SULFAMETHOXAZOLE-TMP DS 800-160 MG PO TABS
1.0000 | ORAL_TABLET | Freq: Two times a day (BID) | ORAL | Status: DC
  Administered 2012-12-25 (×2): 1 via ORAL
  Filled 2012-12-25 (×4): qty 1

## 2012-12-25 MED ORDER — FAMOTIDINE IN NACL 20-0.9 MG/50ML-% IV SOLN
INTRAVENOUS | Status: AC
  Filled 2012-12-25: qty 50

## 2012-12-25 MED ORDER — MIDAZOLAM HCL 2 MG/2ML IJ SOLN
INTRAMUSCULAR | Status: AC
  Filled 2012-12-25: qty 2

## 2012-12-25 MED ORDER — DIAZEPAM 5 MG PO TABS
5.0000 mg | ORAL_TABLET | ORAL | Status: AC
  Administered 2012-12-25: 5 mg via ORAL
  Filled 2012-12-25: qty 1

## 2012-12-25 MED ORDER — HYDRALAZINE HCL 20 MG/ML IJ SOLN
INTRAMUSCULAR | Status: AC
  Filled 2012-12-25: qty 1

## 2012-12-25 MED ORDER — CLOPIDOGREL BISULFATE 75 MG PO TABS
ORAL_TABLET | ORAL | Status: AC
  Filled 2012-12-25: qty 4

## 2012-12-25 MED ORDER — HYDRALAZINE HCL 20 MG/ML IJ SOLN
10.0000 mg | Freq: Once | INTRAMUSCULAR | Status: AC
  Administered 2012-12-25: 16:00:00 10 mg via INTRAVENOUS

## 2012-12-25 MED ORDER — NITROGLYCERIN IN D5W 200-5 MCG/ML-% IV SOLN
INTRAVENOUS | Status: AC
  Filled 2012-12-25: qty 250

## 2012-12-25 MED ORDER — SODIUM CHLORIDE 0.9 % IV SOLN
INTRAVENOUS | Status: DC
  Administered 2012-12-25: 08:00:00 via INTRAVENOUS

## 2012-12-25 MED ORDER — ONDANSETRON HCL 4 MG/2ML IJ SOLN: 4.0000 mg | Freq: Four times a day (QID) | INTRAMUSCULAR | Status: DC | PRN

## 2012-12-25 MED ORDER — CLOPIDOGREL BISULFATE 75 MG PO TABS
300.0000 mg | ORAL_TABLET | Freq: Once | ORAL | Status: AC
  Administered 2012-12-25: 300 mg via ORAL

## 2012-12-25 MED ORDER — TRIAMTERENE-HCTZ 37.5-25 MG PO CAPS
1.0000 | ORAL_CAPSULE | Freq: Every day | ORAL | Status: DC
  Filled 2012-12-25: qty 1

## 2012-12-25 MED ORDER — FENTANYL CITRATE 0.05 MG/ML IJ SOLN
INTRAMUSCULAR | Status: AC
  Filled 2012-12-25: qty 2

## 2012-12-25 MED ORDER — HEPARIN (PORCINE) IN NACL 2-0.9 UNIT/ML-% IJ SOLN
INTRAMUSCULAR | Status: AC
  Filled 2012-12-25: qty 1000

## 2012-12-25 MED ORDER — MORPHINE SULFATE 2 MG/ML IJ SOLN
1.0000 mg | INTRAMUSCULAR | Status: DC | PRN
  Administered 2012-12-25: 1 mg via INTRAVENOUS
  Filled 2012-12-25: qty 1

## 2012-12-25 NOTE — Interval H&P Note (Signed)
History and Physical Interval Note:  12/25/2012 11:10 AM  Nicole Sellers  has presented today for surgery, with the diagnosis of claudication  The various methods of treatment have been discussed with the patient and family. After consideration of risks, benefits and other options for treatment, the patient has consented to  Procedure(s): LOWER EXTREMITY ANGIOGRAM (N/A) as a surgical intervention .  The patient's history has been reviewed, patient examined, no change in status, stable for surgery.  I have reviewed the patient's chart and labs.  Questions were answered to the patient's satisfaction.     Lorretta Harp

## 2012-12-25 NOTE — Progress Notes (Signed)
Site area: left groin  Site Prior to Removal:  Level 0  Pressure Applied For 45 MINUTES    Minutes Beginning at 1741  Manual:   yes  Patient Status During Pull:  AAO X 4  Post Pull Groin Site:  Level 0  Post Pull Instructions Given:  yes  Post Pull Pulses Present:  yes  Dressing Applied:  yes  Comments:  Tolerated procedure well,extended pressure hold for hemostasis

## 2012-12-25 NOTE — CV Procedure (Signed)
Nicole Sellers is a 62 y.o. female    [id number] LOCATION:  FACILITY: [facility]  PHYSICIAN: Quay Burow, M.D. 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 She has history of non-insulin-requiring diabetes untreated hypertension. She's never had a heart attack or stroke. She complains of dyspnea but denies chest pain. She's had claudication of less than 3 years which is lifestyle limiting and a gangrenous right second toe of the last 2-3 weeks. Dopplers her office suggested high-grade right SFA disease with an occluded anterior tibial as was the occluded left SFA.she underwent angiography several weeks ago revealing high-grade segmentally occluded and calcified proximal and mid right SFA stenosis with one vessel runoff via the peroneal. She presents now for staged diamondback orbital rotational atherectomy, PTA plus or minus stenting with IDEV stents.  PROCEDURE DESCRIPTION:   The patient was brought to the second floor [facility] cath lab in the postabsorptive state. She was premedicated with Valium 5 mg by mouth, IV Versed and fentanyl. Her left groinwas prepped and shaved in usual sterile fashion. Xylocaine 1% was used for local anesthesia. A 6 French sheath was inserted into the left common femoral artery using standard Seldinger technique. Contralateral access was obtained with a 5 [place] crossover catheter and 035 Versed core wire. Following this a 6 [place] by 55 cm long Ansell sheath was then advanced over the iliac medication placed in the right external iliac artery.the patient  received a total of 11,500 units of heparin with an ACT of 232. A total of 163 cc he contrast is administered the patient   HEMODYNAMICS:    AO SYSTOLIC/AO DIASTOLIC: [id number]   Angiographic Data:   The right SFA lesions were crossed with a 014/300 centimeter long Rigali wire through 018  Quick cross endhole catheter. The Rigali a wire was then exchanged for the O14 Viper wire. A 1.5 millimeter solid burr  Was used to atherectomized the proximal and mid right SFA calcified segments. A NAV 6 distal protection filter was placed prior to this in the above annealed knee popliteal artery. Following this angioplastywas performed with a 5 mm x 80 mm long chocolate balloon in both atherectomized segments resulting in significant linear/flow-limiting dissections. Following this the vessels were prepared with a 6 mm x 100 mm long balloon in both areas and I DEV stenting was performed using 5.5 mm x 100 mm long stent distally and 80 mm long proximally. A 6 x 80 absolute was used to stent the very proximal right SFA/ostium and the entire segment was postdilated with a 5 mm x 150 mm long noncompliant balloon. Completion angiography was then completed after the filter was which retrieved.  IMPRESSION:successful diamondback orbital rotational arthrectomy, PTA using chocolate  balloon and stenting using I DEV stent of long segment calcified high-grade proximal and mid right SFA stenosis with one-vessel runoff in the setting of critical limb ischemia. The sheath was then withdrawn across the bifurcation and exchanged for a short 6 [place] sheath. This was secured. The patient left the Cath Lab in stable condition. She was given 300 mg of by mouth Plavix prior to leaving  the lab. She'll be hydrated overnight. The sheath will be removed once the ACT falls below 170 and pressure will be held. She will be discharged home in the morning.    Lorretta Harp MD, [facility] 12/25/2012 1:39 PM

## 2012-12-25 NOTE — H&P (View-Only) (Signed)
12/13/2012 Carrington Clamp   [birth year]  [id number]  Primary Physician Salena Saner., MD Primary Cardiologist: Lorretta Harp MD Renae Gloss   HPI:  Ms. Osteen is a 62 year old mildly overweight towards African American female mother of one child and works as a [employer]ar[employer] manager. She was referred by Dr.Ajlouny from [facility] podiatry for evaluation of critical limb ischemia.her risk factors include a 25-50-pack-year history of tobacco abuse smoking one half pack per day. She has history of non-insulin-requiring diabetes untreated hypertension. She's never had a heart attack or stroke. She complains of dyspnea but denies chest pain. She's had claudication of less than 3 years which is lifestyle limiting and a gangrenous right second toe of the last 2-3 weeks. Dopplers her office suggested high-grade right SFA disease with an occluded anterior tibial as was the occluded left SFA. Pencil B. To proceed with angiography and potential intervention.   Current Outpatient Prescriptions  Medication Sig Dispense Refill  . aspirin 81 MG tablet Take 81 mg by mouth daily.      . cholecalciferol (VITAMIN D) 400 UNITS TABS tablet Take 400 Units by mouth daily.      . Glucosamine-Chondroitin (OSTEO BI-FLEX REGULAR STRENGTH PO) Take by mouth.      . levothyroxine (SYNTHROID, LEVOTHROID) 100 MCG tablet Take 100 mcg by mouth daily before breakfast. UNSURE of DOSE      . metFORMIN (GLUCOPHAGE) 500 MG tablet Take 1,000 mg by mouth 2 (two) times daily with a meal.      . Multiple Vitamin (MULTIVITAMIN) capsule Take 1 capsule by mouth daily.      Marland Kitchen triamterene-hydrochlorothiazide (DYAZIDE) 37.5-25 MG per capsule Take 1 capsule by mouth daily.       No current facility-administered medications for this visit.    No Known Allergies  History   Social History  . Marital Status: Single    Spouse Name: N/A    Number of Children: N/A  . Years of Education: N/A   Occupational History  . Not on file.     Social History Main Topics  . Smoking status: Current Every Day Smoker -- 0.50 packs/day  . Smokeless tobacco: Not on file  . Alcohol Use: Not on file  . Drug Use: Not on file  . Sexual Activity: Not on file   Other Topics Concern  . Not on file   Social History Narrative  . No narrative on file     Review of Systems: General: negative for chills, fever, night sweats or weight changes.  Cardiovascular: negative for chest pain, dyspnea on exertion, edema, orthopnea, palpitations, paroxysmal nocturnal dyspnea or shortness of breath Dermatological: negative for rash Respiratory: negative for cough or wheezing Urologic: negative for hematuria Abdominal: negative for nausea, vomiting, diarrhea, bright red blood per rectum, melena, or hematemesis Neurologic: negative for visual changes, syncope, or dizziness All other systems reviewed and are otherwise negative except as noted above.    Blood pressure 161/88, pulse 89, height 5' 6.5" (1.689 m), weight 167 lb (75.751 kg).  General appearance: alert and no distress Neck: no adenopathy, no JVD, supple, symmetrical, trachea midline, thyroid not enlarged, symmetric, no tenderness/mass/nodules and left carotid bruit Lungs: clear to auscultation bilaterally Heart: regular rate and rhythm, S1, S2 normal, no murmur, click, rub or gallop Extremities: extremities normal, atraumatic, no cyanosis or edema and 2+ femorals with bruits bilaterally. Absent pedal pulses Pulses: 2+ and symmetric absent pedal pulses  EKG not performed today  ASSESSMENT AND PLAN:   Critical lower  limb ischemia Aspartate has had claudication for the last 2 or 3 years. Over the last 2-3 weeks she developed a gangrenous right second toe. She saw her podiatrist who referred her here for further evaluation. Lower extremity arterial Dopplers revealed a right ABI of 0.7 and a left of 0.46. She has high-grade right SFA disease with occluded right anterior tibial artery. Her  left SFA is occluded. Based on this we'll proceed with angiography and potential endovascular therapy.  Essential hypertension Controlled on current medications      Lorretta Harp MD [facility], [facility] 12/13/2012 4:36 PM

## 2012-12-26 ENCOUNTER — Telehealth: Payer: Self-pay | Admitting: *Deleted

## 2012-12-26 ENCOUNTER — Other Ambulatory Visit (HOSPITAL_COMMUNITY): Payer: Self-pay | Admitting: *Deleted

## 2012-12-26 ENCOUNTER — Encounter (HOSPITAL_COMMUNITY): Payer: Self-pay | Admitting: *Deleted

## 2012-12-26 ENCOUNTER — Other Ambulatory Visit: Payer: Self-pay | Admitting: Cardiology

## 2012-12-26 DIAGNOSIS — I999 Unspecified disorder of circulatory system: Secondary | ICD-10-CM

## 2012-12-26 DIAGNOSIS — I739 Peripheral vascular disease, unspecified: Secondary | ICD-10-CM

## 2012-12-26 DIAGNOSIS — Z9889 Other specified postprocedural states: Secondary | ICD-10-CM

## 2012-12-26 DIAGNOSIS — I998 Other disorder of circulatory system: Secondary | ICD-10-CM

## 2012-12-26 DIAGNOSIS — I10 Essential (primary) hypertension: Secondary | ICD-10-CM

## 2012-12-26 DIAGNOSIS — Z01818 Encounter for other preprocedural examination: Secondary | ICD-10-CM

## 2012-12-26 DIAGNOSIS — I70229 Atherosclerosis of native arteries of extremities with rest pain, unspecified extremity: Secondary | ICD-10-CM

## 2012-12-26 DIAGNOSIS — F17200 Nicotine dependence, unspecified, uncomplicated: Secondary | ICD-10-CM

## 2012-12-26 DIAGNOSIS — I724 Aneurysm of artery of lower extremity: Secondary | ICD-10-CM

## 2012-12-26 DIAGNOSIS — I96 Gangrene, not elsewhere classified: Secondary | ICD-10-CM

## 2012-12-26 LAB — BASIC METABOLIC PANEL
BUN: 11 mg/dL (ref 6–23)
CO2: 25 mEq/L (ref 19–32)
Calcium: 9.3 mg/dL (ref 8.4–10.5)
Chloride: 101 mEq/L (ref 96–112)
Creatinine, Ser: 0.85 mg/dL (ref 0.50–1.10)
GFR calc Af Amer: 82 mL/min — ABNORMAL LOW (ref >90)
GFR calc non Af Amer: 71 mL/min — ABNORMAL LOW (ref >90)
Glucose, Bld: 190 mg/dL — ABNORMAL HIGH (ref 70–99)
Potassium: 3.9 mEq/L (ref 3.5–5.1)
Sodium: 136 mEq/L (ref 135–145)

## 2012-12-26 LAB — CBC
HCT: 30.5 % — ABNORMAL LOW (ref 36.0–46.0)
Hemoglobin: 10.4 g/dL — ABNORMAL LOW (ref 12.0–15.0)
MCH: 30.3 pg (ref 26.0–34.0)
MCHC: 34.1 g/dL (ref 30.0–36.0)
MCV: 88.9 fL (ref 78.0–100.0)
Platelets: 407 10*3/uL — ABNORMAL HIGH (ref 150–400)
RBC: 3.43 MIL/uL — ABNORMAL LOW (ref 3.87–5.11)
RDW: 12.6 % (ref 11.5–15.5)
WBC: 7.1 10*3/uL (ref 4.0–10.5)

## 2012-12-26 LAB — GLUCOSE, CAPILLARY: Glucose-Capillary: 237 mg/dL — ABNORMAL HIGH (ref 70–99)

## 2012-12-26 MED ORDER — CLOPIDOGREL BISULFATE 75 MG PO TABS: 75.0000 mg | ORAL_TABLET | Freq: Every day | ORAL | Status: DC

## 2012-12-26 NOTE — Discharge Summary (Signed)
Physician Discharge Summary  Patient ID: ADAM SCHEPPS MRN: [id number] DOB/AGE: 03/28/1950 62 y.o.  Admit date: 12/25/2012 Discharge date: 12/26/2012  Admission Diagnoses: Critical Lower Limb Ischemia  Discharge Diagnoses:  Principal Problem:   Critical lower limb ischemia Active Problems:   Essential hypertension   Diabetes   Tobacco abuse   PAD (peripheral artery disease) Rt ABI 0.7, Lt ABI 0.46   S/P arterial stent, 12/25/13, successful diamondback orbital rotational arthrectomy, PTA using chocolate  balloon and stenting using I DEV stent of long segment calcified high-grade proximal and mid r   Gangrene of toe, Rt second toe   Discharged Condition: stable  Hospital Course: The patient is a 62 y/o AAF, who was referred by Dr. Fritzi Mandes from [facility] for evaluation of critical limb ischemia. Her risk factors include 25-50 pack-year history of tobacco abuse and a history of non-insulin dependent DM and untreated HTN. She has had claudication for 3 years, which is lifestyle limiting. She has had a gangrenous right second toe for 2-3 weeks.  Dopplers in the office suggested high-grade right SFA disease with an occluded anterior tibial, as well as an occluded left SFA. She underwent angiography several weeks ago revealing high-grade segmentally occluded and calcified proximal and mid right SFA stenosis with one vessel runoff via the peroneal.  She presented back to [facility] on 12/25/12 to undergo staged diamondback orbital rotational atherectomy. The procedure was performed by Dr. Gwenlyn Found via the left femoral artery. She underwent successful diamondback orbital rotational arthrectomy, PTA using chocolate balloon and stenting using IDEV stent of long segment calcified high-grade proximal and mid right SFA stenosis with one-vessel runoff. She tolerated the procedure well. She was placed on Plavix and left the cath lab in stable condition. She was kept overnight for observation and  hydration. On day 1 post-cath, she was noted to have a moderate size hemartoma in the left groin with a bruit. She underwent a vascular ultrasound, which demonstrated no evidence of a left lower extremity pseudoaneurysm or AV fistula. She had no significant pain and was ambulating w/o difficulty. The patient was last seen and examined by Dr. Gwenlyn Found, who determined she was stable for discharge home. She will have f/u LEAs in the office and will f/u with Dr. Gwenlyn Found in 1 month. She was instructed to notify our office if she develops any significant left groin pain or swelling.   Consults: None  Significant Diagnostic Studies:   PV procedure 12/25/12 HEMODYNAMICS:  AO SYSTOLIC/AO DIASTOLIC: [id number]  Angiographic Data:  The right SFA lesions were crossed with a 014/300 centimeter long Rigali wire through 018 Quick cross endhole catheter. The Rigali a wire was then exchanged for the O14 Viper wire. A 1.5 millimeter solid burr Was used to atherectomized the proximal and mid right SFA calcified segments. A NAV 6 distal protection filter was placed prior to this in the above annealed knee popliteal artery. Following this angioplastywas performed with a 5 mm x 80 mm long chocolate balloon in both atherectomized segments resulting in significant linear/flow-limiting dissections. Following this the vessels were prepared with a 6 mm x 100 mm long balloon in both areas and I DEV stenting was performed using 5.5 mm x 100 mm long stent distally and 80 mm long proximally. A 6 x 80 absolute was used to stent the very proximal right SFA/ostium and the entire segment was postdilated with a 5 mm x 150 mm long noncompliant balloon. Completion angiography was then completed after the filter was which retrieved.  Treatments: See Hospital Course  Discharge Exam: Blood pressure 159/56, pulse 88, temperature 98.4 F (36.9 C), temperature source Oral, resp. rate 18, height 5' 6.5" (1.689 m), weight 164 lb 3.9 oz (74.5 kg), SpO2  96.00%.   Disposition: 01-Home or Self Care  Discharge Orders   Future Appointments Provider Department Dept Phone   01/25/2013 11:00 AM Lorretta Harp, MD [facility] [phone]   Future Orders Complete By Expires   Diet - low sodium heart healthy  As directed    Discharge instructions  As directed    Comments:     Wait until Tomorrow to restart Metformin Call our office if you develop any severe pain near the groin.   Driving Restrictions  As directed    Comments:     No driving for 3 days   Increase activity slowly  As directed    Lifting restrictions  As directed    Comments:     No lifting anything heavier than 1/2 gallon of milk for 3 day       Medication List         aspirin 81 MG tablet  Take 81 mg by mouth daily.     CALCIUM PO  Take 1 tablet by mouth daily.     cholecalciferol 400 UNITS Tabs tablet  Commonly known as:  VITAMIN D  Take 400 Units by mouth daily.     clopidogrel 75 MG tablet  Commonly known as:  PLAVIX  Take 1 tablet (75 mg total) by mouth daily with breakfast.     levothyroxine 50 MCG tablet  Commonly known as:  SYNTHROID, LEVOTHROID  Take 50 mcg by mouth daily before breakfast.     metFORMIN 500 MG tablet  Commonly known as:  GLUCOPHAGE  Take 1,000 mg by mouth 2 (two) times daily with a meal.     multivitamin capsule  Take 1 capsule by mouth daily.     OSTEO BI-FLEX REGULAR STRENGTH PO  Take 1 tablet by mouth 2 (two) times daily.     sulfamethoxazole-trimethoprim 800-160 MG per tablet  Commonly known as:  BACTRIM DS  Take 1 tablet by mouth 2 (two) times daily. Starting 12/13/12 for 10 days     triamterene-hydrochlorothiazide 37.5-25 MG per capsule  Commonly known as:  DYAZIDE  Take 1 capsule by mouth daily.     Vitamin B Complex Tabs  Take 1 tablet by mouth daily.           Follow-up Information   Follow up with Lorretta Harp, MD On 01/25/2013. (11:00 am)    Specialty:  Cardiology   Contact  information:   [place] [address] [phone]      TIME SPENT ON DISCHARGE, INCLUDING PHYSICIAN TIME: > 30 MINUTES  Signed: Consuelo Pandy, PA-C 12/26/2012, 10:50 AM

## 2012-12-26 NOTE — Progress Notes (Signed)
VASCULAR LAB PRELIMINARY  PRELIMINARY  PRELIMINARY  PRELIMINARY  Left lower extremity evaluation for a pseudoaneurysm completed.    Preliminary report:  No evidence of a left lower extremity pseudoaneurysm or A - V fistula.  Sharmane Dame, RVS 12/26/2012, 10:33 AM

## 2012-12-26 NOTE — Telephone Encounter (Signed)
2 separate forms filled out FMLA and insurance forms and sent to [employer] via fax

## 2012-12-26 NOTE — Progress Notes (Signed)
Subjective: No complaints  Objective: Vital signs in last 24 hours: Temp:  [97.7 F (36.5 C)-99.2 F (37.3 C)] 98.8 F (37.1 C) (12/17 0640) Pulse Rate:  [64-97] 97 (12/17 0640) Resp:  [16-18] 18 (12/17 0640) BP: (108-193)/(47-103) 167/71 mmHg (12/17 0640) SpO2:  [96 %-100 %] 96 % (12/17 0640) Weight:  [164 lb 3.9 oz (74.5 kg)] 164 lb 3.9 oz (74.5 kg) (12/17 0640) Last BM Date: 12/24/12  Intake/Output from previous day: 12/16 0701 - 12/17 0700 In: 73 [P.O.:480; I.V.:300] Out: 1400 [Urine:1400] Intake/Output this shift:    Medications Current Facility-Administered Medications  Medication Dose Route Frequency Provider Last Rate Last Dose  . acetaminophen (TYLENOL) tablet 650 mg  650 mg Oral Q4H PRN Lorretta Harp, MD   650 mg at 12/26/12 [phone]  . aspirin EC tablet 325 mg  325 mg Oral Daily Lorretta Harp, MD      . clopidogrel (PLAVIX) tablet 75 mg  75 mg Oral Q breakfast Lorretta Harp, MD      . hydrALAZINE (APRESOLINE) injection 10 mg  10 mg Intravenous UD Lorretta Harp, MD   10 mg at 12/25/12 2128  . levothyroxine (SYNTHROID, LEVOTHROID) tablet 50 mcg  50 mcg Oral QAC breakfast Lorretta Harp, MD   50 mcg at 12/26/12 [phone]  . morphine 2 MG/ML injection 1 mg  1 mg Intravenous Q1H PRN Lorretta Harp, MD   1 mg at 12/25/12 1728  . ondansetron (ZOFRAN) injection 4 mg  4 mg Intravenous Q6H PRN Lorretta Harp, MD      . sulfamethoxazole-trimethoprim (BACTRIM DS) 800-160 MG per tablet 1 tablet  1 tablet Oral BID Lorretta Harp, MD   1 tablet at 12/25/12 2128  . triamterene-hydrochlorothiazide (MAXZIDE-25) 37.5-25 MG per tablet 1 tablet  1 tablet Oral Daily Lorretta Harp, MD   1 tablet at 12/25/12 1529    PE: General appearance: alert, cooperative and no distress Lungs: clear to auscultation bilaterally Heart: regular rate and rhythm, S1, S2 normal, no murmur, click, rub or gallop Extremities: No LEE.   Pulses: 2+ radials.   0 DPs  Feet are warm. Skin:  Warmand dry.  Left groin:  Moderate hematoma and bruit.   Neurologic: Grossly normal  Lab Results:   Recent Labs  12/25/12 0800 12/26/12 0540  WBC 5.4 7.1  HGB 12.1 10.4*  HCT 34.4* 30.5*  PLT 458* 407*   BMET  Recent Labs  12/25/12 0800 12/26/12 0540  NA 137 136  K 4.0 3.9  CL 101 101  CO2 26 25  GLUCOSE 229* 190*  BUN 13 11  CREATININE 0.78 0.85  CALCIUM 9.8 9.3    Assessment/Plan   Principal Problem:   Critical lower limb ischemia Active Problems:   Essential hypertension   Diabetes   Tobacco abuse   PAD (peripheral artery disease) Rt ABI 0.7, Lt ABI 0.46   S/P arterial stent, 12/25/13, successful diamondback orbital rotational arthrectomy, PTA using chocolate  balloon and stenting using I DEV stent of long segment calcified high-grade proximal and mid r   Gangrene of toe, Rt second toe  Plan:  SP successful diamondback orbital rotational arthrectomy, PTA using chocolate balloon and stenting using I DEV stent of long segment calcified high-grade proximal and mid right SFA stenosis.  She has a moderate size hematoma in the left groin with a bruit.  Will have vascular check for a pseudoaneurysm.  If negative, DC home.  OP LEA dopplers.  LOS: 1 day    HAGER, BRYAN 12/26/2012 8:08 AM   Agree with note written by Luisa Dago PAC  S/P RSFA DB atherectomy, PTA and IDEV stenting for CLI. She has a 2+ Right PT pulse this AM. Her second toe appears necrotic and I suspect will auto amputate. I've touched base with Dr. Fritzi Mandes, her Podiatrist, who will arrange close OP F/U. She has a bruit in left groin so will get a duplex to R/O PA. If OK can D/C home on DAPT, OP LEA then ROV with me.    Lorretta Harp 12/26/2012 9:42 AM

## 2012-12-26 NOTE — Progress Notes (Signed)
Patient ambulating in hall with cane, tolerating well. States that walking is better after procedure, she notices a difference.No complaints with ambulation.

## 2013-01-16 ENCOUNTER — Ambulatory Visit (HOSPITAL_COMMUNITY)
Admission: RE | Admit: 2013-01-16 | Discharge: 2013-01-16 | Disposition: A | Discharge status: Home / Self Care | Payer: PRIVATE HEALTH INSURANCE | Source: Ambulatory Visit | Attending: Internal Medicine | Admitting: Internal Medicine

## 2013-01-16 DIAGNOSIS — I999 Unspecified disorder of circulatory system: Secondary | ICD-10-CM

## 2013-01-16 DIAGNOSIS — I70229 Atherosclerosis of native arteries of extremities with rest pain, unspecified extremity: Secondary | ICD-10-CM

## 2013-01-16 DIAGNOSIS — I739 Peripheral vascular disease, unspecified: Secondary | ICD-10-CM

## 2013-01-16 DIAGNOSIS — I70209 Unspecified atherosclerosis of native arteries of extremities, unspecified extremity: Secondary | ICD-10-CM | POA: Insufficient documentation

## 2013-01-16 DIAGNOSIS — I998 Other disorder of circulatory system: Secondary | ICD-10-CM

## 2013-01-16 DIAGNOSIS — I70219 Atherosclerosis of native arteries of extremities with intermittent claudication, unspecified extremity: Secondary | ICD-10-CM

## 2013-01-16 NOTE — Progress Notes (Signed)
Right Lower Ext. Arterial Duplex Completed. Greogory Cornette, BS, RDMS, RVT  

## 2013-01-25 ENCOUNTER — Ambulatory Visit (INDEPENDENT_AMBULATORY_CARE_PROVIDER_SITE_OTHER): Payer: PRIVATE HEALTH INSURANCE | Admitting: Cardiovascular Disease

## 2013-01-25 ENCOUNTER — Encounter: Payer: Self-pay | Admitting: Cardiovascular Disease

## 2013-01-25 VITALS — BP 171/89 | HR 86 | Ht 66.5 in | Wt 151.0 lb

## 2013-01-25 DIAGNOSIS — Z959 Presence of cardiac and vascular implant and graft, unspecified: Secondary | ICD-10-CM

## 2013-01-25 DIAGNOSIS — E785 Hyperlipidemia, unspecified: Secondary | ICD-10-CM

## 2013-01-25 DIAGNOSIS — Z79899 Other long term (current) drug therapy: Secondary | ICD-10-CM

## 2013-01-25 DIAGNOSIS — Z9889 Other specified postprocedural states: Secondary | ICD-10-CM

## 2013-01-25 DIAGNOSIS — I10 Essential (primary) hypertension: Secondary | ICD-10-CM

## 2013-01-25 MED ORDER — AMLODIPINE BESYLATE 5 MG PO TABS: 5.0000 mg | ORAL_TABLET | Freq: Every day | ORAL | Status: DC

## 2013-01-25 NOTE — Assessment & Plan Note (Signed)
Her blood pressure is elevated today. She is on a diuretic. I am going to add amlodipine 5 mg a day for hypertension.

## 2013-01-25 NOTE — Progress Notes (Signed)
01/25/2013 Carrington Clamp   1950-10-17  [id number]  Primary Physician Salena Saner., MD Primary Cardiologist: Lorretta Harp MD Renae Gloss   HPI:  [name] is a 63 year old mildly overweight towards African American female mother of one child and works as a [employer] manager. She was referred by Dr.Ajlouny from [facility] podiatry for evaluation of critical limb ischemia.her risk factors include a 25-50-pack-year history of tobacco abuse smoking one half pack per day. She has history of non-insulin-requiring diabetes untreated hypertension. She's never had a heart attack or stroke. She complains of dyspnea but denies chest pain. She's had claudication of less than 3 years which is lifestyle limiting and a gangrenous right second toe of the last 2-3 weeks. Dopplers her office suggested high-grade right SFA disease with an occluded anterior tibial as was the occluded left SFA.she underwent angiography several weeks ago revealing high-grade segmentally occluded and calcified proximal and mid right SFA stenosis with one vessel runoff via the peroneal. She underwent staged diamondback with rotational atherectomy, PTA and stenting of a long segment calcified subtotally occluded right SFA for critical limb ischemia (gangrenous right second toe). She will vessel runoff via the posterior tibial. Her Dopplers revealed increase in her ABIs from 0.77  To .93 post intervention. Resting pain has resolved. Her gangrenous toes beginning to demarcate I will probably auto amputate. We have discussed once again the importance of smoking cessation.  Current Outpatient Prescriptions  Medication Sig Dispense Refill  . aspirin 81 MG tablet Take 81 mg by mouth daily.      . B Complex Vitamins (VITAMIN B COMPLEX) TABS Take 1 tablet by mouth daily.      Marland Kitchen CALCIUM PO Take 1 tablet by mouth daily.      . cholecalciferol (VITAMIN D) 400 UNITS TABS tablet Take 400 Units by mouth daily.      . clopidogrel (PLAVIX)  75 MG tablet Take 1 tablet (75 mg total) by mouth daily with breakfast.  30 tablet  11  . Glucosamine-Chondroitin (OSTEO BI-FLEX REGULAR STRENGTH PO) Take 1 tablet by mouth 2 (two) times daily.       Marland Kitchen levothyroxine (SYNTHROID, LEVOTHROID) 50 MCG tablet Take 50 mcg by mouth daily before breakfast.      . metFORMIN (GLUCOPHAGE) 500 MG tablet Take 1,000 mg by mouth 2 (two) times daily with a meal.      . Multiple Vitamin (MULTIVITAMIN) capsule Take 1 capsule by mouth daily.      Marland Kitchen triamterene-hydrochlorothiazide (DYAZIDE) 37.5-25 MG per capsule Take 1 capsule by mouth daily.       No current facility-administered medications for this visit.    Allergies  Allergen Reactions  . Lisinopril     Bruising     History   Social History  . Marital Status: Single    Spouse Name: N/A    Number of Children: N/A  . Years of Education: N/A   Occupational History  . Not on file.   Social History Main Topics  . Smoking status: Current Every Day Smoker -- 0.50 packs/day for 48 years    Types: Cigarettes  . Smokeless tobacco: Never Used  . Alcohol Use: Yes     Comment: 12/25/2012 "drink a beer maybe once/month"  . Drug Use: No  . Sexual Activity: Not Currently   Other Topics Concern  . Not on file   Social History Narrative  . No narrative on file     Review of Systems: General: negative for chills, fever,  night sweats or weight changes.  Cardiovascular: negative for chest pain, dyspnea on exertion, edema, orthopnea, palpitations, paroxysmal nocturnal dyspnea or shortness of breath Dermatological: negative for rash Respiratory: negative for cough or wheezing Urologic: negative for hematuria Abdominal: negative for nausea, vomiting, diarrhea, bright red blood per rectum, melena, or hematemesis Neurologic: negative for visual changes, syncope, or dizziness All other systems reviewed and are otherwise negative except as noted above.    Blood pressure 171/89, pulse 86, height 5' 6.5"  (1.689 m), weight 151 lb (68.493 kg).  General appearance: alert and no distress Neck: no adenopathy, no JVD, supple, symmetrical, trachea midline, thyroid not enlarged, symmetric, no tenderness/mass/nodules and soft bilateral carotid bruits Lungs: clear to auscultation bilaterally Heart: regular rate and rhythm, S1, S2 normal, no murmur, click, rub or gallop Extremities: extremities normal, atraumatic, no cyanosis or edema and her right foot is warm. She is a 2+ right posterior tibial pulse. Her right second toe is gangrenous and will probably autoamputated  EKG not performed today  ASSESSMENT AND PLAN:   S/P arterial stent, 12/25/13, successful diamondback orbital rotational arthrectomy, PTA using chocolate  balloon and stenting using I DEV stent of long segment calcified high-grade proximal and mid r Ms. Park he underwent diamondback orbital rotational atherectomy, PTA and stenting using Supera IDEV self-expanding stents by myself 12/25/12 after a diagnostic intercampus and lipase performed revealed subtotally occluded calcified right SFA with one vessel runoff via the posterior tibial and an occluded left SFA. She did have a gangrenous right second toe which continues to demarcate. Her resting pain has resolved. Her Dopplers show marked improvement with an increase in her right ABI from .77 to .93. She has no symptoms on left side.  Essential hypertension Her blood pressure is elevated today. She is on a diuretic. I am going to add amlodipine 5 mg a day for hypertension.      Lorretta Harp MD FACP,FACC,FAHA, [facility] 01/25/2013 1:09 PM

## 2013-01-25 NOTE — Patient Instructions (Addendum)
Your physician wants you to follow-up in: 3 months with Dr Gwenlyn Found. You will receive a reminder letter in the mail two months in advance. If you don't receive a letter, please call our office to schedule the follow-up appointment.   We will do an ultrasound of your leg arteries in 6 months.  Dr Gwenlyn Found wants you to start a medication by the name of amlodipine 5 mg daily and to have blood work done to check your cholesterol.

## 2013-01-25 NOTE — Assessment & Plan Note (Signed)
Nicole Sellers he underwent diamondback orbital rotational atherectomy, PTA and stenting using Supera IDEV self-expanding stents by myself 12/25/12 after a diagnostic intercampus and lipase performed revealed subtotally occluded calcified right SFA with one vessel runoff via the posterior tibial and an occluded left SFA. She did have a gangrenous right second toe which continues to demarcate. Her resting pain has resolved. Her Dopplers show marked improvement with an increase in her right ABI from .77 to .93. She has no symptoms on left side.

## 2013-03-15 ENCOUNTER — Encounter (INDEPENDENT_AMBULATORY_CARE_PROVIDER_SITE_OTHER): Payer: PRIVATE HEALTH INSURANCE | Admitting: Ophthalmology

## 2013-03-15 DIAGNOSIS — E11311 Type 2 diabetes mellitus with unspecified diabetic retinopathy with macular edema: Secondary | ICD-10-CM

## 2013-03-15 DIAGNOSIS — E11359 Type 2 diabetes mellitus with proliferative diabetic retinopathy without macular edema: Secondary | ICD-10-CM

## 2013-03-15 DIAGNOSIS — E1139 Type 2 diabetes mellitus with other diabetic ophthalmic complication: Secondary | ICD-10-CM

## 2013-03-15 DIAGNOSIS — E11319 Type 2 diabetes mellitus with unspecified diabetic retinopathy without macular edema: Secondary | ICD-10-CM

## 2013-03-15 DIAGNOSIS — E1165 Type 2 diabetes mellitus with hyperglycemia: Secondary | ICD-10-CM

## 2013-03-15 DIAGNOSIS — I10 Essential (primary) hypertension: Secondary | ICD-10-CM

## 2013-03-15 DIAGNOSIS — H43819 Vitreous degeneration, unspecified eye: Secondary | ICD-10-CM

## 2013-03-15 DIAGNOSIS — H35039 Hypertensive retinopathy, unspecified eye: Secondary | ICD-10-CM

## 2013-04-03 ENCOUNTER — Ambulatory Visit (INDEPENDENT_AMBULATORY_CARE_PROVIDER_SITE_OTHER): Payer: PRIVATE HEALTH INSURANCE | Admitting: Ophthalmology

## 2013-04-11 ENCOUNTER — Ambulatory Visit (INDEPENDENT_AMBULATORY_CARE_PROVIDER_SITE_OTHER): Payer: PRIVATE HEALTH INSURANCE | Admitting: Ophthalmology

## 2013-04-11 DIAGNOSIS — H3581 Retinal edema: Secondary | ICD-10-CM

## 2013-04-11 DIAGNOSIS — E1139 Type 2 diabetes mellitus with other diabetic ophthalmic complication: Secondary | ICD-10-CM

## 2013-04-11 DIAGNOSIS — H2510 Age-related nuclear cataract, unspecified eye: Secondary | ICD-10-CM

## 2013-04-11 DIAGNOSIS — H35039 Hypertensive retinopathy, unspecified eye: Secondary | ICD-10-CM

## 2013-04-11 DIAGNOSIS — E1165 Type 2 diabetes mellitus with hyperglycemia: Secondary | ICD-10-CM

## 2013-04-11 DIAGNOSIS — H43819 Vitreous degeneration, unspecified eye: Secondary | ICD-10-CM

## 2013-04-11 DIAGNOSIS — E11359 Type 2 diabetes mellitus with proliferative diabetic retinopathy without macular edema: Secondary | ICD-10-CM

## 2013-04-11 DIAGNOSIS — I10 Essential (primary) hypertension: Secondary | ICD-10-CM

## 2013-05-07 ENCOUNTER — Other Ambulatory Visit (INDEPENDENT_AMBULATORY_CARE_PROVIDER_SITE_OTHER): Payer: PRIVATE HEALTH INSURANCE | Admitting: Ophthalmology

## 2013-07-18 ENCOUNTER — Telehealth (HOSPITAL_COMMUNITY): Payer: Self-pay | Admitting: *Deleted

## 2013-08-07 ENCOUNTER — Other Ambulatory Visit: Payer: Self-pay | Admitting: Family

## 2013-08-07 ENCOUNTER — Ambulatory Visit
Admission: RE | Admit: 2013-08-07 | Discharge: 2013-08-07 | Disposition: A | Discharge status: Home / Self Care | Payer: PRIVATE HEALTH INSURANCE | Source: Ambulatory Visit | Attending: Family | Admitting: Family

## 2013-08-07 DIAGNOSIS — R0989 Other specified symptoms and signs involving the circulatory and respiratory systems: Secondary | ICD-10-CM

## 2013-08-07 DIAGNOSIS — Z87891 Personal history of nicotine dependence: Secondary | ICD-10-CM

## 2013-08-07 DIAGNOSIS — R05 Cough: Secondary | ICD-10-CM

## 2013-08-07 DIAGNOSIS — R059 Cough, unspecified: Secondary | ICD-10-CM

## 2013-10-16 ENCOUNTER — Other Ambulatory Visit: Payer: Self-pay | Admitting: Cardiovascular Disease

## 2013-10-16 NOTE — Telephone Encounter (Signed)
Refill for amlodipine refused. Refilled #[id number] with 3 refills in Jan 2015 >> takes only once daily

## 2013-11-05 ENCOUNTER — Encounter (HOSPITAL_COMMUNITY): Payer: Self-pay | Admitting: Emergency Medicine

## 2013-11-05 ENCOUNTER — Emergency Department (HOSPITAL_COMMUNITY): Payer: PRIVATE HEALTH INSURANCE

## 2013-11-05 ENCOUNTER — Emergency Department (HOSPITAL_COMMUNITY)
Admission: EM | Admit: 2013-11-05 | Discharge: 2013-11-05 | Disposition: A | Discharge status: Home / Self Care | Payer: PRIVATE HEALTH INSURANCE | Attending: Emergency Medicine | Admitting: Emergency Medicine

## 2013-11-05 DIAGNOSIS — E039 Hypothyroidism, unspecified: Secondary | ICD-10-CM | POA: Insufficient documentation

## 2013-11-05 DIAGNOSIS — Z7982 Long term (current) use of aspirin: Secondary | ICD-10-CM | POA: Diagnosis not present

## 2013-11-05 DIAGNOSIS — S0003XA Contusion of scalp, initial encounter: Secondary | ICD-10-CM | POA: Diagnosis not present

## 2013-11-05 DIAGNOSIS — Z720 Tobacco use: Secondary | ICD-10-CM | POA: Insufficient documentation

## 2013-11-05 DIAGNOSIS — M1990 Unspecified osteoarthritis, unspecified site: Secondary | ICD-10-CM | POA: Diagnosis not present

## 2013-11-05 DIAGNOSIS — Z79899 Other long term (current) drug therapy: Secondary | ICD-10-CM | POA: Insufficient documentation

## 2013-11-05 DIAGNOSIS — E119 Type 2 diabetes mellitus without complications: Secondary | ICD-10-CM | POA: Insufficient documentation

## 2013-11-05 DIAGNOSIS — I10 Essential (primary) hypertension: Secondary | ICD-10-CM | POA: Diagnosis not present

## 2013-11-05 DIAGNOSIS — Z7902 Long term (current) use of antithrombotics/antiplatelets: Secondary | ICD-10-CM | POA: Diagnosis not present

## 2013-11-05 DIAGNOSIS — Z8719 Personal history of other diseases of the digestive system: Secondary | ICD-10-CM | POA: Insufficient documentation

## 2013-11-05 DIAGNOSIS — S0990XA Unspecified injury of head, initial encounter: Secondary | ICD-10-CM

## 2013-11-05 NOTE — ED Provider Notes (Signed)
CSN: [id number]     Arrival date & time 11/05/13  0751 History   First MD Initiated Contact with Patient 11/05/13 0813     Chief Complaint  Patient presents with  . Fall     (Consider location/radiation/quality/duration/timing/severity/associated sxs/prior Treatment) HPI Comments: Patient is a 63 year old female with history of hypertension, diabetes, and peripheral artery disease. She presents with complaints of head injury sustained during a fall. 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 She was working this morning to assist the client stand when she lost her balance, fell backward, and struck her head on the nightstand. She denies loss of consciousness but reports headache and significant swelling to her occiput. The swelling has improved somewhat with ice. She denies nausea and vomiting. She denies neck pain. She denies other injury with the fall.  Patient is a 63 y.o. female presenting with fall. The history is provided by the patient.  Fall This is a new problem. The current episode started 1 to 2 hours ago. The problem occurs constantly. The problem has not changed since onset.Associated symptoms include headaches. Nothing aggravates the symptoms. Nothing relieves the symptoms. She has tried nothing for the symptoms. The treatment provided no relief.    Past Medical History  Diagnosis Date  . Hypertension   . Tobacco abuse   . Critical lower limb ischemia   . PAD (peripheral artery disease)   . High cholesterol   . Hypothyroidism   . Type II diabetes mellitus   . GERD (gastroesophageal reflux disease)   . Arthritis     "joints sometimes" (12/25/2012)  . S/P arterial stent, 12/25/13, successful diamondback orbital rotational arthrectomy, PTA using chocolate  balloon and stenting using I DEV stent of long segment calcified high-grade proximal and mid r 12/25/2012  . Gangrene of toe, Rt second toe 12/25/2012   Past Surgical History  Procedure Laterality Date  . Angioplasty /  stenting femoral Right 12/25/2012  . Hernia repair    . Umbilical hernia repair  2000  . Abdominal hysterectomy  2000   No family history on file. History  Substance Use Topics  . Smoking status: Current Every Day Smoker -- 0.50 packs/day for 48 years    Types: Cigarettes  . Smokeless tobacco: Never Used  . Alcohol Use: Yes     Comment: 12/25/2012 "drink a beer maybe once/month"   OB History   Grav Para Term Preterm Abortions TAB SAB Ect Mult Living                 Review of Systems  Neurological: Positive for headaches.  All other systems reviewed and are negative.     Allergies  Lisinopril  Home Medications   Prior to Admission medications   Medication Sig Start Date End Date Taking? Authorizing Provider  amLODipine (NORVASC) 5 MG tablet Take 1 tablet (5 mg total) by mouth daily. 01/25/13   Lorretta Harp, MD  aspirin 81 MG tablet Take 81 mg by mouth daily.    Historical Provider, MD  B Complex Vitamins (VITAMIN B COMPLEX) TABS Take 1 tablet by mouth daily.    Historical Provider, MD  CALCIUM PO Take 1 tablet by mouth daily.    Historical Provider, MD  cholecalciferol (VITAMIN D) 400 UNITS TABS tablet Take 400 Units by mouth daily.    Historical Provider, MD  clopidogrel (PLAVIX) 75 MG tablet Take 1 tablet (75 mg total) by mouth daily with breakfast. 12/26/12   Brittainy Erie Noe, PA-C  Glucosamine-Chondroitin (OSTEO  BI-FLEX REGULAR STRENGTH PO) Take 1 tablet by mouth 2 (two) times daily.     Historical Provider, MD  levothyroxine (SYNTHROID, LEVOTHROID) 50 MCG tablet Take 50 mcg by mouth daily before breakfast.    Historical Provider, MD  metFORMIN (GLUCOPHAGE) 500 MG tablet Take 1,000 mg by mouth 2 (two) times daily with a meal.    Historical Provider, MD  Multiple Vitamin (MULTIVITAMIN) capsule Take 1 capsule by mouth daily.    Historical Provider, MD  triamterene-hydrochlorothiazide (DYAZIDE) 37.5-25 MG per capsule Take 1 capsule by mouth daily.    Historical  Provider, MD   BP 165/86  Pulse 81  Temp(Src) 98.2 F (36.8 C)  Resp 18  SpO2 100% Physical Exam  Nursing note and vitals reviewed. Constitutional: She is oriented to person, place, and time. She appears well-developed and well-nourished. No distress.  HENT:  Head: Normocephalic.  There is a hematoma palpable to the occipital scalp.  Eyes: EOM are normal. Pupils are equal, round, and reactive to light.  Neck: Normal range of motion. Neck supple.  Cardiovascular: Normal rate and regular rhythm.  Exam reveals no gallop and no friction rub.   No murmur heard. Pulmonary/Chest: Effort normal and breath sounds normal. No respiratory distress. She has no wheezes.  Abdominal: Soft. Bowel sounds are normal. She exhibits no distension. There is no tenderness.  Musculoskeletal: Normal range of motion.  Neurological: She is alert and oriented to person, place, and time. No cranial nerve deficit. She exhibits normal muscle tone. Coordination normal.  Skin: Skin is warm and dry. She is not diaphoretic.    ED Course  Procedures (including critical care time) Labs Review Labs Reviewed - No data to display  Imaging Review No results found.   EKG Interpretation None      MDM   Final diagnoses:  Closed head injury    Patient presents after a fall at work. She fell backward and struck her head on a patient's nightstand. She denies loss of consciousness but does complain of headache and a hematoma to the back of her head. Neurologic exam is nonfocal and head CT is negative for intracranial injury. I feel as though she is appropriate for discharge. To return as needed for any problems.    Veryl Speak, MD 11/05/13 [phone]

## 2013-11-05 NOTE — ED Notes (Signed)
Patient transported to CT 

## 2013-11-05 NOTE — ED Notes (Signed)
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 Pt hit her head against a table leg. Pt presents with occipital hematoma. Pt used ice-pack to reduce the hematoma. Pt denies loss of consciousness or taking anticoagulant.

## 2013-11-05 NOTE — Discharge Instructions (Signed)
Ibuprofen 600 mg every 6 hours as needed for pain.  Return to the emergency department for worsening headache, vomiting, or other new and concerning symptoms.   Head Injury You have received a head injury. It does not appear serious at this time. Headaches and vomiting are common following head injury. It should be easy to awaken from sleeping. Sometimes it is necessary for you to stay in the emergency department for a while for observation. Sometimes admission to the hospital may be needed. After injuries such as yours, most problems occur within the first 24 hours, but side effects may occur up to 7-10 days after the injury. It is important for you to carefully monitor your condition and contact your health care provider or seek immediate medical care if there is a change in your condition. WHAT ARE THE TYPES OF HEAD INJURIES? Head injuries can be as minor as a bump. Some head injuries can be more severe. More severe head injuries include:  A jarring injury to the brain (concussion).  A bruise of the brain (contusion). This mean there is bleeding in the brain that can cause swelling.  A cracked skull (skull fracture).  Bleeding in the brain that collects, clots, and forms a bump (hematoma). WHAT CAUSES A HEAD INJURY? A serious head injury is most likely to happen to someone who is in a car wreck and is not wearing a seat belt. Other causes of major head injuries include bicycle or motorcycle accidents, sports injuries, and falls. HOW ARE HEAD INJURIES DIAGNOSED? A complete history of the event leading to the injury and your current symptoms will be helpful in diagnosing head injuries. Many times, pictures of the brain, such as CT or MRI are needed to see the extent of the injury. Often, an overnight hospital stay is necessary for observation.  WHEN SHOULD I SEEK IMMEDIATE MEDICAL CARE?  You should get help right away if:  You have confusion or drowsiness.  You feel sick to your stomach  (nauseous) or have continued, forceful vomiting.  You have dizziness or unsteadiness that is getting worse.  You have severe, continued headaches not relieved by medicine. Only take over-the-counter or prescription medicines for pain, fever, or discomfort as directed by your health care provider.  You do not have normal function of the arms or legs or are unable to walk.  You notice changes in the black spots in the center of the colored part of your eye (pupil).  You have a clear or bloody fluid coming from your nose or ears.  You have a loss of vision. During the next 24 hours after the injury, you must stay with someone who can watch you for the warning signs. This person should contact local emergency services (911 in the [place] if you have seizures, you become unconscious, or you are unable to wake up. HOW CAN I PREVENT A HEAD INJURY IN THE FUTURE? The most important factor for preventing major head injuries is avoiding motor vehicle accidents. To minimize the potential for damage to your head, it is crucial to wear seat belts while riding in motor vehicles. Wearing helmets while bike riding and playing collision sports (like football) is also helpful. Also, avoiding dangerous activities around the house will further help reduce your risk of head injury.  WHEN CAN I RETURN TO NORMAL ACTIVITIES AND ATHLETICS? You should be reevaluated by your health care provider before returning to these activities. If you have any of the following symptoms, you should not return  to activities or contact sports until 1 week after the symptoms have stopped:  Persistent headache.  Dizziness or vertigo.  Poor attention and concentration.  Confusion.  Memory problems.  Nausea or vomiting.  Fatigue or tire easily.  Irritability.  Intolerant of bright lights or loud noises.  Anxiety or depression.  Disturbed sleep. MAKE SURE YOU:   Understand these instructions.  Will watch your  condition.  Will get help right away if you are not doing well or get worse. Document Released: 12/27/2004 Document Revised: 01/01/2013 Document Reviewed: 09/03/2012 [facility] Patient Information 2015 [place], [place]. This information is not intended to replace advice given to you by your health care provider. Make sure you discuss any questions you have with your health care provider.

## 2013-11-05 NOTE — ED Notes (Addendum)
Pt's skin is warm and dry, respirations even and unlabored. No signs of distress.

## 2013-12-19 ENCOUNTER — Encounter (HOSPITAL_COMMUNITY): Payer: Self-pay | Admitting: Cardiovascular Disease

## 2014-06-27 DIAGNOSIS — I10 Essential (primary) hypertension: Secondary | ICD-10-CM | POA: Diagnosis not present

## 2014-09-23 DIAGNOSIS — Z7982 Long term (current) use of aspirin: Secondary | ICD-10-CM | POA: Diagnosis not present

## 2014-09-23 DIAGNOSIS — I10 Essential (primary) hypertension: Secondary | ICD-10-CM | POA: Diagnosis not present

## 2014-09-23 DIAGNOSIS — E1165 Type 2 diabetes mellitus with hyperglycemia: Secondary | ICD-10-CM | POA: Diagnosis not present

## 2014-09-23 DIAGNOSIS — Z9119 Patient's noncompliance with other medical treatment and regimen: Secondary | ICD-10-CM | POA: Diagnosis not present

## 2014-11-27 ENCOUNTER — Ambulatory Visit (INDEPENDENT_AMBULATORY_CARE_PROVIDER_SITE_OTHER): Payer: PRIVATE HEALTH INSURANCE | Admitting: Family Medicine

## 2014-11-27 ENCOUNTER — Ambulatory Visit (INDEPENDENT_AMBULATORY_CARE_PROVIDER_SITE_OTHER): Payer: PRIVATE HEALTH INSURANCE

## 2014-11-27 VITALS — BP 162/80 | HR 89 | Temp 98.5°F | Resp 18 | Ht 68.0 in | Wt 167.0 lb

## 2014-11-27 DIAGNOSIS — R5383 Other fatigue: Secondary | ICD-10-CM | POA: Diagnosis not present

## 2014-11-27 DIAGNOSIS — N898 Other specified noninflammatory disorders of vagina: Secondary | ICD-10-CM | POA: Diagnosis not present

## 2014-11-27 DIAGNOSIS — J309 Allergic rhinitis, unspecified: Secondary | ICD-10-CM | POA: Diagnosis not present

## 2014-11-27 DIAGNOSIS — R81 Glycosuria: Secondary | ICD-10-CM

## 2014-11-27 DIAGNOSIS — K219 Gastro-esophageal reflux disease without esophagitis: Secondary | ICD-10-CM | POA: Diagnosis not present

## 2014-11-27 DIAGNOSIS — R05 Cough: Secondary | ICD-10-CM

## 2014-11-27 DIAGNOSIS — R0989 Other specified symptoms and signs involving the circulatory and respiratory systems: Secondary | ICD-10-CM | POA: Diagnosis not present

## 2014-11-27 DIAGNOSIS — R739 Hyperglycemia, unspecified: Secondary | ICD-10-CM

## 2014-11-27 DIAGNOSIS — R0602 Shortness of breath: Secondary | ICD-10-CM

## 2014-11-27 DIAGNOSIS — R300 Dysuria: Secondary | ICD-10-CM | POA: Diagnosis not present

## 2014-11-27 DIAGNOSIS — R002 Palpitations: Secondary | ICD-10-CM

## 2014-11-27 DIAGNOSIS — R059 Cough, unspecified: Secondary | ICD-10-CM

## 2014-11-27 DIAGNOSIS — Z87891 Personal history of nicotine dependence: Secondary | ICD-10-CM | POA: Diagnosis not present

## 2014-11-27 DIAGNOSIS — R600 Localized edema: Secondary | ICD-10-CM

## 2014-11-27 LAB — BASIC METABOLIC PANEL
BUN: 13 mg/dL (ref 7–25)
CO2: 33 mmol/L — ABNORMAL HIGH (ref 20–31)
Calcium: 9.7 mg/dL (ref 8.6–10.4)
Chloride: 98 mmol/L (ref 98–110)
Creat: 0.8 mg/dL (ref 0.50–0.99)
Glucose, Bld: 267 mg/dL — ABNORMAL HIGH (ref 65–99)
Potassium: 4.6 mmol/L (ref 3.5–5.3)
Sodium: 137 mmol/L (ref 135–146)

## 2014-11-27 LAB — POCT CBC
Granulocyte percent: 45.2 %G (ref 37–80)
HCT, POC: 35.6 % — AB (ref 37.7–47.9)
Hemoglobin: 12.2 g/dL (ref 12.2–16.2)
Lymph, poc: 2.6 (ref 0.6–3.4)
MCH, POC: 30.1 pg (ref 27–31.2)
MCHC: 34.4 g/dL (ref 31.8–35.4)
MCV: 87.6 fL (ref 80–97)
MID (cbc): 0.3 (ref 0–0.9)
MPV: 6.5 fL (ref 0–99.8)
POC Granulocyte: 2.4 (ref 2–6.9)
POC LYMPH PERCENT: 49.2 %L (ref 10–50)
POC MID %: 5.6 %M (ref 0–12)
Platelet Count, POC: 402 10*3/uL (ref 142–424)
RBC: 4.07 M/uL (ref 4.04–5.48)
RDW, POC: 12.7 %
WBC: 5.3 10*3/uL (ref 4.6–10.2)

## 2014-11-27 LAB — POCT URINALYSIS DIP (MANUAL ENTRY)
Bilirubin, UA: NEGATIVE
Glucose, UA: 250 — AB
Ketones, POC UA: NEGATIVE
Leukocytes, UA: NEGATIVE
Nitrite, UA: NEGATIVE
Spec Grav, UA: 1.01
Urobilinogen, UA: 0.2
pH, UA: 5.5

## 2014-11-27 LAB — POCT WET + KOH PREP
Trich by wet prep: ABSENT
Yeast by KOH: ABSENT
Yeast by wet prep: ABSENT

## 2014-11-27 LAB — TSH: TSH: 2.244 u[IU]/mL (ref 0.350–4.500)

## 2014-11-27 LAB — POC MICROSCOPIC URINALYSIS (UMFC): Mucus: ABSENT

## 2014-11-27 LAB — GLUCOSE, POCT (MANUAL RESULT ENTRY): POC Glucose: 251 mg/dl — AB (ref 70–99)

## 2014-11-27 LAB — BRAIN NATRIURETIC PEPTIDE: Brain Natriuretic Peptide: 14.6 pg/mL (ref 0.0–100.0)

## 2014-11-27 MED ORDER — OMEPRAZOLE 20 MG PO CPDR: 20.0000 mg | DELAYED_RELEASE_CAPSULE | Freq: Every day | ORAL | Status: DC

## 2014-11-27 NOTE — Progress Notes (Addendum)
Subjective:  This chart was scribed for Merri Ray, MD by Leandra Kern, Medical Scribe. This patient was seen in Room 10 and the patient's care was started at 10:20 AM.   Patient ID: Nicole Sellers, female    DOB: [birth year], 64 y.o.   MRN: [id number]  Chief Complaint  Patient presents with  . Cough    x1 mth now, not coughing up any mucous   . chest congestion  . Sore Throat  . Shortness of Breath  . Headache  . Fatigue    HPI HPI Comments: Nicole Sellers is a 64 y.o. female with a history of HTN, DM, Peripheral atrial disease, and smoking with a pack a day for 24 years; who presents to [facility] complaining of dry cough with associated chest congestion, onset one month ago.   Cough: Pt reports symptoms of rhinorrhea, sore throat, post nasal drip, fatigue, headache, shortness of breath, since onset of symptoms, with exacerbation such as walking. Pt was previously prescribed albuterol inhaler in 07/2013 for her shortness of breath by Barbaraann Share and she indicates finding minimal relief with it. She does have a previous history of allergies for which she takes Zyrtec one pill per day, and Nasacort every day. She denies fevers, hx of asthma, COPD, or any cardiac conditions.   Back pain:  Pt notes that she is experiencing back pain.   Heart burn: Pt also reports having symptoms of heart burn for which she takes zantec for.   Ankle swelling: Pt also reports swelling in the ankles, onset 2 weeks ago. She also notes that the area is painful to walk on. Pt indicates that she also presents with discomfort of her calves, however she states that it is not new and it is similar to her previous claudication. Pt denies prolonged car rides, recent travels, or a hx of blood clots.   Dysuria: Pt also notes symptoms of dysuria, onset 2 weeks ago. She reports associated symptoms of vaginal discharge, frequency, and vaginal itchiness. Pt is concerned of a possible  UTI or a yeast infection.  Palpitations: Pt reports having fluttering of her heart over the past month, however she denies chest pain. Pt has a family history of CHF (father).   Pt follows up with Loleta Chance at [facility], last visit in October, however she does not have an assigned PCP.    Pt works as a Quarry manager at a nursing home.   Patient Active Problem List   Diagnosis Date Noted  . PAD (peripheral artery disease) Rt ABI 0.7, Lt ABI 0.46 12/25/2012  . S/P arterial stent, 12/25/13, successful diamondback orbital rotational arthrectomy, PTA using chocolate  balloon and stenting using I DEV stent of long segment calcified high-grade proximal and mid r 12/25/2012  . Gangrene of toe, Rt second toe 12/25/2012  . Essential hypertension 12/13/2012  . Diabetes ([place]) 12/13/2012  . Critical lower limb ischemia 12/13/2012  . Tobacco abuse 12/13/2012   Past Medical History  Diagnosis Date  . Hypertension   . Tobacco abuse   . Critical lower limb ischemia   . PAD (peripheral artery disease) ([place])   . High cholesterol   . Hypothyroidism   . Type II diabetes mellitus ([place])   . GERD (gastroesophageal reflux disease)   . Arthritis     "joints sometimes" (12/25/2012)  . S/P arterial stent, 12/25/13, successful diamondback orbital rotational arthrectomy, PTA using chocolate  balloon and stenting using I DEV stent of  long segment calcified high-grade proximal and mid r 12/25/2012  . Gangrene of toe, Rt second toe 12/25/2012  . Allergy   . Clotting disorder [facility])    Past Surgical History  Procedure Laterality Date  . Angioplasty / stenting femoral Right 12/25/2012  . Hernia repair    . Umbilical hernia repair  2000  . Abdominal hysterectomy  2000  . Lower extremity angiogram N/A 12/20/2012    Procedure: LOWER EXTREMITY ANGIOGRAM;  Surgeon: Lorretta Harp, MD;  Location: [facility] CATH LAB;  Service: Cardiovascular;  Laterality: N/A;  . Abdominal aortagram  12/20/2012     Procedure: ABDOMINAL AORTAGRAM;  Surgeon: Lorretta Harp, MD;  Location: [facility] CATH LAB;  Service: Cardiovascular;;  . Lower extremity angiogram N/A 12/25/2012    Procedure: LOWER EXTREMITY ANGIOGRAM;  Surgeon: Lorretta Harp, MD;  Location: [facility] CATH LAB;  Service: Cardiovascular;  Laterality: N/A;  . Atherectomy Right 12/25/2012    Procedure: ATHERECTOMY;  Surgeon: Lorretta Harp, MD;  Location: [facility] CATH LAB;  Service: Cardiovascular;  Laterality: Right;  right SFA   Allergies  Allergen Reactions  . Lisinopril     Bruising   . Plavix [Clopidogrel Bisulfate]    Prior to Admission medications   Medication Sig Start Date End Date Taking? Authorizing Provider  albuterol (PROVENTIL HFA;VENTOLIN HFA) 108 (90 BASE) MCG/ACT inhaler Inhale 2 puffs into the lungs every 6 (six) hours as needed for wheezing or shortness of breath.   Yes Historical Provider, MD  aspirin 81 MG tablet Take 81 mg by mouth daily.   Yes Historical Provider, MD  B Complex Vitamins (VITAMIN B COMPLEX) TABS Take 1 tablet by mouth daily.   Yes Historical Provider, MD  CALCIUM PO Take 1 tablet by mouth daily.   Yes Historical Provider, MD  cetirizine (ZYRTEC) 10 MG tablet Take 10 mg by mouth daily.   Yes Historical Provider, MD  cholecalciferol (VITAMIN D) 400 UNITS TABS tablet Take 400 Units by mouth daily.   Yes Historical Provider, MD  gabapentin (NEURONTIN) 300 MG capsule Take 300 mg by mouth at bedtime.   Yes Historical Provider, MD  Glucosamine-Chondroitin (OSTEO BI-FLEX REGULAR STRENGTH PO) Take 1 tablet by mouth 2 (two) times daily.    Yes Historical Provider, MD  levothyroxine (SYNTHROID, LEVOTHROID) 50 MCG tablet Take 50 mcg by mouth daily before breakfast.   Yes Historical Provider, MD  metFORMIN (GLUCOPHAGE) 500 MG tablet Take 1,000 mg by mouth 2 (two) times daily with a meal.   Yes Historical Provider, MD  Multiple Vitamin (MULTIVITAMIN) capsule Take 1 capsule by mouth daily.   Yes Historical Provider, MD    Tetrahydrozoline HCl (VISINE OP) Place 2 drops into both eyes daily as needed (dry eyes).   Yes Historical Provider, MD  triamterene-hydrochlorothiazide (DYAZIDE) 37.5-25 MG per capsule Take 1 capsule by mouth daily.   Yes Historical Provider, MD   Social History   Social History  . Marital Status: Single    Spouse Name: N/A  . Number of Children: N/A  . Years of Education: N/A   Occupational History  . Not on file.   Social History Main Topics  . Smoking status: Former Smoker -- 0.50 packs/day for 48 years    Types: Cigarettes  . Smokeless tobacco: Never Used  . Alcohol Use: No     Comment: 12/25/2012 "drink a beer maybe once/month"  . Drug Use: No  . Sexual Activity: Not Currently   Other Topics Concern  . Not on file  Social History Narrative    Review of Systems  Constitutional: Positive for fatigue. Negative for fever.  HENT: Positive for congestion, postnasal drip, rhinorrhea and sore throat.   Respiratory: Positive for cough and shortness of breath.   Cardiovascular: Positive for palpitations. Negative for chest pain.  Genitourinary: Positive for dysuria, frequency and vaginal discharge.  Musculoskeletal: Positive for myalgias, back pain, joint swelling and arthralgias.  Allergic/Immunologic: Positive for environmental allergies.  Neurological: Positive for headaches.       Objective:   Physical Exam  Constitutional: She is oriented to person, place, and time. She appears well-developed and well-nourished. No distress.  HENT:  Head: Normocephalic and atraumatic.  Mouth/Throat: Mucous membranes are normal. No oropharyngeal exudate.  Eyes: EOM are normal. Pupils are equal, round, and reactive to light.  Neck: Neck supple.  Cardiovascular: Normal rate.   Pulmonary/Chest: Effort normal. No stridor. No respiratory distress. She has wheezes (Faint inspiratory wheeze ).  Musculoskeletal: She exhibits edema (sligth non pitting edema with the left greater than the  right ankle).  Calf circumference  is equal at 35 cm BL, measured at 15 cm below the patella. No calf swelling.  Neurological: She is alert and oriented to person, place, and time. No cranial nerve deficit.  Skin: Skin is warm and dry.  Psychiatric: She has a normal mood and affect. Her behavior is normal.  Nursing note and vitals reviewed.   Filed Vitals:   11/27/14 0951  BP: 162/80  Pulse: 89  Temp: 98.5 F (36.9 C)  TempSrc: Oral  Resp: 18  Height: 5\' 8"  (1.727 m)  Weight: 167 lb (75.751 kg)  SpO2: 98%   EKG reading: sinus rhythm, rate 78. No apparent acute findings.   [facility] (PRIMARY) x-ray report read by Dr. Merri Ray, MD: Chest- questionable increased markings vs. Scaring right lower lobe. Increased bronchitic markings right vs left. No apparent effusion.    Results for orders placed or performed in visit on 11/27/14  POCT CBC  Result Value Ref Range   WBC 5.3 4.6 - 10.2 K/uL   Lymph, poc 2.6 0.6 - 3.4   POC LYMPH PERCENT 49.2 10 - 50 %L   MID (cbc) 0.3 0 - 0.9   POC MID % 5.6 0 - 12 %M   POC Granulocyte 2.4 2 - 6.9   Granulocyte percent 45.2 37 - 80 %G   RBC 4.07 4.04 - 5.48 M/uL   Hemoglobin 12.2 12.2 - 16.2 g/dL   HCT, POC 35.6 (A) 37.7 - 47.9 %   MCV 87.6 80 - 97 fL   MCH, POC 30.1 27 - 31.2 pg   MCHC 34.4 31.8 - 35.4 g/dL   RDW, POC 12.7 %   Platelet Count, POC 402 142 - 424 K/uL   MPV 6.5 0 - 99.8 fL  POCT urinalysis dipstick  Result Value Ref Range   Color, UA yellow yellow   Clarity, UA clear clear   Glucose, UA =250 (A) negative   Bilirubin, UA negative negative   Ketones, POC UA negative negative   Spec Grav, UA 1.010    Blood, UA trace-lysed (A) negative   pH, UA 5.5    Protein Ur, POC trace (A) negative   Urobilinogen, UA 0.2    Nitrite, UA Negative Negative   Leukocytes, UA Negative Negative  POCT Microscopic Urinalysis ([facility])  Result Value Ref Range   WBC,UR,HPF,POC Few (A) None WBC/hpf   RBC,UR,HPF,POC None None RBC/hpf   Bacteria  None None, Too numerous to  count   Mucus Absent Absent   Epithelial Cells, UR Per Microscopy Few (A) None, Too numerous to count cells/hpf  POCT Wet + KOH Prep  Result Value Ref Range   Yeast by KOH Absent Present, Absent   Yeast by wet prep Absent Present, Absent   WBC by wet prep Few None, Few, Too numerous to count   Clue Cells Wet Prep HPF POC None None, Too numerous to count   Trich by wet prep Absent Present, Absent   Bacteria Wet Prep HPF POC None None, Few, Too numerous to count   Epithelial Cells By [employer] ([facility]) Few None, Few, Too numerous to count   RBC,UR,HPF,POC None None RBC/hpf  POCT glucose (manual entry)  Result Value Ref Range   POC Glucose 251 (A) 70 - 99 mg/dl      Assessment & Plan:   Nicole Sellers is a 64 y.o. female Cough, Shortness of breath, Chest congestion -  Plan: DG Chest 2 View, Brain natriuretic peptide, POCT CBC, Ambulatory referral to Cardiology, CANCELED: Ambulatory referral to Cardiology Plan: DG Chest 2 View  -  Possible viral illness, but with reported symptoms of dyspnea and lower extremity edema,  Chest x-ray obtained. No apparent congestive heart failure. We'll check a BNP with her dyspnea on exertion as above. Referred her to cardiology for this as well as her heart palpitations,  And history of PAD, but no concerning findings on EKG in office.   -plan on follow-up with her primary care provider  In 1 day to follow-up on this along with multiple concerns addressed in office today   History of tobacco abuse  - No apparent concerning findings on chest x-ray, O2 sat within normal limits.  Edema of lower extremity, unspecified laterality - Plan: Basic metabolic panel, TSH, Ambulatory referral to Cardiology  - TSH, BMP, refer to cardiology for further evaluation, follow up  With PCP to determine further evaluation.  Allergic rhinitis, unspecified allergic rhinitis type  - Continue Zyrtec and Nasacort. Allergen avoidance.  Dysuria - Plan:  POCT urinalysis dipstick, POCT Microscopic Urinalysis ([facility])  - no apparent UTI  On in office testing. Will check urine culture.  Vaginal discharge - Plan: POCT Wet + KOH Prep  -  No concerning findings on wet prep. May have a component of atrophic/dystrophic vaginitis with estrogen deficiency. Can discuss topical treatments with her  Primary care provider at upcoming appointment  Gastroesophageal reflux disease, esophagitis presence not specified - Plan: omeprazole (PRILOSEC) 20 MG capsule  - change to omeprazole once per day, but list of foods to avoid was provided.  Palpitations - Plan: EKG 12-Lead, TSH, Ambulatory referral to Cardiology, CANCELED: Ambulatory referral to Cardiology  - As above. No concerning findings on EKG in office, TSH pending refer to cardiology with underlying PAD.   Other fatigue Glycosuria  Hyperglycemia - Plan: CANCELED: POCT glycosylated hemoglobin (Hb A1C)  - Hyperglycemic in office, this may be part of her fatigue with decreased control of diabetes.  Advised to bring record of her home blood sugar readings to her upcoming appointment with primary care provider to help decide on change in medications if needed.   Meds ordered this encounter  Medications  . cetirizine (ZYRTEC) 10 MG tablet    Sig: Take 10 mg by mouth daily.  Marland Kitchen albuterol (PROVENTIL HFA;VENTOLIN HFA) 108 (90 BASE) MCG/ACT inhaler    Sig: Inhale 2 puffs into the lungs every 6 (six) hours as needed for wheezing or shortness of breath.  Marland Kitchen  omeprazole (PRILOSEC) 20 MG capsule    Sig: Take 1 capsule (20 mg total) by mouth daily.    Dispense:  30 capsule    Refill:  1   Patient Instructions  We have arranged an appointment for you tomorrow at 10:30 with your primary care practice to follow up on some of the concerns today.   For your cough, and your chest x-ray did not indicate any pneumonia or any acute process. You can use albuterol up to every 6 hours as needed, but if you're having to use this  medicine more than twice per day, follow back up here or the emergency room within the next 3-4 days. I also think allergies and heartburn may be contributing to this. Continue your allergy pills and nasal spray every day, additionally I sent in omeprazole 1 pill once per day for heartburn, but avoid the foods as listed below that are known to make heartburn worse.  If your shortness of breath is worsening, or otherwise feeling worse, return here or go to the emergency room.  For the heart palpitations, shortness of breath, fatigue, and swelling in ankles, I am checking some other blood tests but I would also like you to follow-up with the cardiologist. I did put a referral in for this, but if chest pain, heart palpitations, or any worsening of your symptoms call 911 or go to emergency room.   I do not see any obvious sign of urinary tract infection or genital infection causing the irritation. I will check a urine culture, but suspect this will be normal. Follow-up to discuss this with your primary provider tomorrow further. 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   Your blood sugar was elevated here today. Follow-up with your primary provider tomorrow, check your blood sugars at home 2-3 times prior to that visit tomorrow so they can see your home readings. No change in your medications otherwise for right now.  Return to the clinic or go to the nearest emergency room if any of your symptoms worsen or new symptoms occur.  Shortness of Breath Shortness of breath means you have trouble breathing. It could also mean that you have a medical problem. You should get immediate medical care for shortness of breath. CAUSES   Not enough oxygen in the air such as with high altitudes or a smoke-filled room.  Certain lung diseases, infections, or problems.  Heart disease or conditions, such as  angina or heart failure.  Low red blood cells (anemia).  Poor physical fitness, which can cause shortness of breath when you exercise.  Chest or back injuries or stiffness.  Being overweight.  Smoking.  Anxiety, which can make you feel like you are not getting enough air. DIAGNOSIS  Serious medical problems can often be found during your physical exam. Tests may also be done to determine why you are having shortness of breath. Tests may include:  Chest X-rays.  Lung function tests.  Blood tests.  An electrocardiogram (ECG).  An ambulatory electrocardiogram. An ambulatory ECG records your heartbeat patterns over a 24-hour period.  Exercise testing.  A transthoracic echocardiogram (TTE). During echocardiography, sound waves are used to evaluate how blood flows through your heart.  A transesophageal echocardiogram (TEE).  Imaging scans. Your health care provider may not be able to find a cause for your shortness of breath after your exam. In this case, it is  important to have a follow-up exam with your health care provider as directed.  TREATMENT  Treatment for shortness of breath depends on the cause of your symptoms and can vary greatly. HOME CARE INSTRUCTIONS   Do not smoke. Smoking is a common cause of shortness of breath. If you smoke, ask for help to quit.  Avoid being around chemicals or things that may bother your breathing, such as paint fumes and dust.  Rest as needed. Slowly resume your usual activities.  If medicines were prescribed, take them as directed for the full length of time directed. This includes oxygen and any inhaled medicines.  Keep all follow-up appointments as directed by your health care provider. SEEK MEDICAL CARE IF:   Your condition does not improve in the time expected.  You have a hard time doing your normal activities even with rest.  You have any new symptoms. SEEK IMMEDIATE MEDICAL CARE IF:   Your shortness of breath gets  worse.  You feel light-headed, faint, or develop a cough not controlled with medicines.  You start coughing up blood.  You have pain with breathing.  You have chest pain or pain in your arms, shoulders, or abdomen.  You have a fever.  You are unable to walk up stairs or exercise the way you normally do. MAKE SURE YOU:  Understand these instructions.  Will watch your condition.  Will get help right away if you are not doing well or get worse.   This information is not intended to replace advice given to you by your health care provider. Make sure you discuss any questions you have with your health care provider.   Document Released: 09/21/2000 Document Revised: 01/01/2013 Document Reviewed: 03/14/2011 Elsevier Interactive Patient Education 2016 [place] Fatigue is feeling tired all of the time, a lack of energy, or a lack of motivation. Occasional or mild fatigue is often a normal response to activity or life in general. However, long-lasting (chronic) or extreme fatigue may indicate an underlying medical condition. HOME CARE INSTRUCTIONS  Watch your fatigue for any changes. The following actions may help to lessen any discomfort you are feeling:  Talk to your health care provider about how much sleep you need each night. Try to get the required amount every night.  Take medicines only as directed by your health care provider.  Eat a healthy and nutritious diet. Ask your health care provider if you need help changing your diet.  Drink enough fluid to keep your urine clear or pale yellow.  Practice ways of relaxing, such as yoga, meditation, massage therapy, or acupuncture.  Exercise regularly.   Change situations that cause you stress. Try to keep your work and personal routine reasonable.  Do not abuse illegal drugs.  Limit alcohol intake to no more than 1 drink per day for nonpregnant women and 2 drinks per day for men. One drink equals 12 ounces of  beer, 5 ounces of wine, or 1 ounces of hard liquor.  Take a multivitamin, if directed by your health care provider. SEEK MEDICAL CARE IF:   Your fatigue does not get better.  You have a fever.   You have unintentional weight loss or gain.  You have headaches.   You have difficulty:   Falling asleep.  Sleeping throughout the night.  You feel angry, guilty, anxious, or sad.   You are unable to have a bowel movement (constipation).   You skin is dry.   Your legs or another part  of your body is swollen.  SEEK IMMEDIATE MEDICAL CARE IF:   You feel confused.   Your vision is blurry.  You feel faint or pass out.   You have a severe headache.   You have severe abdominal, pelvic, or back pain.   You have chest pain, shortness of breath, or an irregular or fast heartbeat.   You are unable to urinate or you urinate less than normal.   You develop abnormal bleeding, such as bleeding from the rectum, vagina, nose, lungs, or nipples.  You vomit blood.   You have thoughts about harming yourself or committing suicide.   You are worried that you might harm someone else.    This information is not intended to replace advice given to you by your health care provider. Make sure you discuss any questions you have with your health care provider.   Document Released: 10/24/2006 Document Revised: 01/17/2014 Document Reviewed: 04/30/2013 Elsevier Interactive Patient Education [employer].        By signing my name below, I, Rawaa Al Rifaie, attest that this documentation has been prepared under the direction and in the presence of Merri Ray, MD.  Royann Shivers Rifaie, Medical Scribe. 11/27/2014.  10:41 AM.  I personally performed the services described in this documentation, which was scribed in my presence. The recorded information has been reviewed and considered, and addended by me as needed.

## 2014-11-27 NOTE — Patient Instructions (Addendum)
We have arranged an appointment for you tomorrow at 10:30 with your primary care practice to follow up on some of the concerns today.   For your cough, and your chest x-ray did not indicate any pneumonia or any acute process. You can use albuterol up to every 6 hours as needed, but if you're having to use this medicine more than twice per day, follow back up here or the emergency room within the next 3-4 days. I also think allergies and heartburn may be contributing to this. Continue your allergy pills and nasal spray every day, additionally I sent in omeprazole 1 pill once per day for heartburn, but avoid the foods as listed below that are known to make heartburn worse.  If your shortness of breath is worsening, or otherwise feeling worse, return here or go to the emergency room.  For the heart palpitations, shortness of breath, fatigue, and swelling in ankles, I am checking some other blood tests but I would also like you to follow-up with the cardiologist. I did put a referral in for this, but if chest pain, heart palpitations, or any worsening of your symptoms call 911 or go to emergency room.   I do not see any obvious sign of urinary tract infection or genital infection causing the irritation. I will check a urine culture, but suspect this will be normal. Follow-up to discuss this with your primary provider tomorrow further. 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   Your blood sugar was elevated here today. Follow-up with your primary provider tomorrow, check your blood sugars at home 2-3 times prior to that visit tomorrow so they can see your home readings. No change in your medications otherwise for right now.  Return to the clinic or go to the nearest emergency room if any of your symptoms worsen or new symptoms occur.  Shortness of Breath Shortness of breath means you have  trouble breathing. It could also mean that you have a medical problem. You should get immediate medical care for shortness of breath. CAUSES   Not enough oxygen in the air such as with high altitudes or a smoke-filled room.  Certain lung diseases, infections, or problems.  Heart disease or conditions, such as angina or heart failure.  Low red blood cells (anemia).  Poor physical fitness, which can cause shortness of breath when you exercise.  Chest or back injuries or stiffness.  Being overweight.  Smoking.  Anxiety, which can make you feel like you are not getting enough air. DIAGNOSIS  Serious medical problems can often be found during your physical exam. Tests may also be done to determine why you are having shortness of breath. Tests may include:  Chest X-rays.  Lung function tests.  Blood tests.  An electrocardiogram (ECG).  An ambulatory electrocardiogram. An ambulatory ECG records your heartbeat patterns over a 24-hour period.  Exercise testing.  A transthoracic echocardiogram (TTE). During echocardiography, sound waves are used to evaluate how blood flows through your heart.  A transesophageal echocardiogram (TEE).  Imaging scans. Your health care provider may not be able to find a cause for your shortness of breath after your exam. In this case, it is important to have a follow-up exam with your health care provider as directed.  TREATMENT  Treatment for shortness of breath depends on the cause of your symptoms and can vary greatly. HOME CARE INSTRUCTIONS  Do not smoke. Smoking is a common cause of shortness of breath. If you smoke, ask for help to quit.  Avoid being around chemicals or things that may bother your breathing, such as paint fumes and dust.  Rest as needed. Slowly resume your usual activities.  If medicines were prescribed, take them as directed for the full length of time directed. This includes oxygen and any inhaled medicines.  Keep all  follow-up appointments as directed by your health care provider. SEEK MEDICAL CARE IF:   Your condition does not improve in the time expected.  You have a hard time doing your normal activities even with rest.  You have any new symptoms. SEEK IMMEDIATE MEDICAL CARE IF:   Your shortness of breath gets worse.  You feel light-headed, faint, or develop a cough not controlled with medicines.  You start coughing up blood.  You have pain with breathing.  You have chest pain or pain in your arms, shoulders, or abdomen.  You have a fever.  You are unable to walk up stairs or exercise the way you normally do. MAKE SURE YOU:  Understand these instructions.  Will watch your condition.  Will get help right away if you are not doing well or get worse.   This information is not intended to replace advice given to you by your health care provider. Make sure you discuss any questions you have with your health care provider.   Document Released: 09/21/2000 Document Revised: 01/01/2013 Document Reviewed: 03/14/2011 Elsevier Interactive Patient Education 2016 [place] Fatigue is feeling tired all of the time, a lack of energy, or a lack of motivation. Occasional or mild fatigue is often a normal response to activity or life in general. However, long-lasting (chronic) or extreme fatigue may indicate an underlying medical condition. HOME CARE INSTRUCTIONS  Watch your fatigue for any changes. The following actions may help to lessen any discomfort you are feeling:  Talk to your health care provider about how much sleep you need each night. Try to get the required amount every night.  Take medicines only as directed by your health care provider.  Eat a healthy and nutritious diet. Ask your health care provider if you need help changing your diet.  Drink enough fluid to keep your urine clear or pale yellow.  Practice ways of relaxing, such as yoga, meditation, massage therapy,  or acupuncture.  Exercise regularly.   Change situations that cause you stress. Try to keep your work and personal routine reasonable.  Do not abuse illegal drugs.  Limit alcohol intake to no more than 1 drink per day for nonpregnant women and 2 drinks per day for men. One drink equals 12 ounces of beer, 5 ounces of wine, or 1 ounces of hard liquor.  Take a multivitamin, if directed by your health care provider. SEEK MEDICAL CARE IF:   Your fatigue does not get better.  You have a fever.   You have unintentional weight loss or gain.  You have headaches.   You have difficulty:   Falling asleep.  Sleeping throughout the night.  You feel angry, guilty, anxious, or sad.   You are unable to have a bowel movement (constipation).   You skin is dry.   Your legs or another part of your body is swollen.  SEEK IMMEDIATE MEDICAL CARE IF:   You feel confused.   Your vision is blurry.  You feel faint or pass out.   You have a severe headache.  You have severe abdominal, pelvic, or back pain.   You have chest pain, shortness of breath, or an irregular or fast heartbeat.   You are unable to urinate or you urinate less than normal.   You develop abnormal bleeding, such as bleeding from the rectum, vagina, nose, lungs, or nipples.  You vomit blood.   You have thoughts about harming yourself or committing suicide.   You are worried that you might harm someone else.    This information is not intended to replace advice given to you by your health care provider. Make sure you discuss any questions you have with your health care provider.   Document Released: 10/24/2006 Document Revised: 01/17/2014 Document Reviewed: 04/30/2013 Elsevier Interactive Patient Education [employer].

## 2014-11-28 ENCOUNTER — Telehealth: Payer: Self-pay | Admitting: Interventional Cardiology

## 2014-11-28 NOTE — Telephone Encounter (Signed)
error 

## 2014-12-01 ENCOUNTER — Ambulatory Visit: Payer: PRIVATE HEALTH INSURANCE | Admitting: Cardiovascular Disease

## 2014-12-18 ENCOUNTER — Encounter: Payer: Self-pay | Admitting: Family Medicine

## 2015-02-03 DIAGNOSIS — I10 Essential (primary) hypertension: Secondary | ICD-10-CM | POA: Diagnosis not present

## 2015-02-03 DIAGNOSIS — E1165 Type 2 diabetes mellitus with hyperglycemia: Secondary | ICD-10-CM | POA: Diagnosis not present

## 2015-02-03 DIAGNOSIS — R1310 Dysphagia, unspecified: Secondary | ICD-10-CM | POA: Diagnosis not present

## 2015-02-04 DIAGNOSIS — Z89429 Acquired absence of other toe(s), unspecified side: Secondary | ICD-10-CM | POA: Diagnosis not present

## 2015-02-04 DIAGNOSIS — L02611 Cutaneous abscess of right foot: Secondary | ICD-10-CM | POA: Diagnosis not present

## 2015-02-04 DIAGNOSIS — L03031 Cellulitis of right toe: Secondary | ICD-10-CM | POA: Diagnosis not present

## 2015-02-04 DIAGNOSIS — E1321 Other specified diabetes mellitus with diabetic nephropathy: Secondary | ICD-10-CM | POA: Diagnosis not present

## 2015-02-04 DIAGNOSIS — M79674 Pain in right toe(s): Secondary | ICD-10-CM | POA: Diagnosis not present

## 2015-02-18 DIAGNOSIS — E1321 Other specified diabetes mellitus with diabetic nephropathy: Secondary | ICD-10-CM | POA: Diagnosis not present

## 2015-02-18 DIAGNOSIS — M216X1 Other acquired deformities of right foot: Secondary | ICD-10-CM | POA: Diagnosis not present

## 2015-02-18 DIAGNOSIS — L03031 Cellulitis of right toe: Secondary | ICD-10-CM | POA: Diagnosis not present

## 2015-02-18 DIAGNOSIS — M216X2 Other acquired deformities of left foot: Secondary | ICD-10-CM | POA: Diagnosis not present

## 2015-05-06 DIAGNOSIS — I10 Essential (primary) hypertension: Secondary | ICD-10-CM | POA: Diagnosis not present

## 2015-05-06 DIAGNOSIS — E1140 Type 2 diabetes mellitus with diabetic neuropathy, unspecified: Secondary | ICD-10-CM | POA: Diagnosis not present

## 2015-07-16 ENCOUNTER — Other Ambulatory Visit: Payer: Self-pay | Admitting: Family Medicine

## 2015-07-18 ENCOUNTER — Other Ambulatory Visit: Payer: Self-pay | Admitting: Family Medicine

## 2015-07-28 DIAGNOSIS — H524 Presbyopia: Secondary | ICD-10-CM | POA: Diagnosis not present

## 2015-07-28 DIAGNOSIS — E133413 Other specified diabetes mellitus with severe nonproliferative diabetic retinopathy with macular edema, bilateral: Secondary | ICD-10-CM | POA: Diagnosis not present

## 2015-08-06 DIAGNOSIS — E1140 Type 2 diabetes mellitus with diabetic neuropathy, unspecified: Secondary | ICD-10-CM | POA: Diagnosis not present

## 2015-08-06 DIAGNOSIS — E784 Other hyperlipidemia: Secondary | ICD-10-CM | POA: Diagnosis not present

## 2015-08-06 DIAGNOSIS — E039 Hypothyroidism, unspecified: Secondary | ICD-10-CM | POA: Diagnosis not present

## 2015-08-06 DIAGNOSIS — I10 Essential (primary) hypertension: Secondary | ICD-10-CM | POA: Diagnosis not present

## 2015-08-17 ENCOUNTER — Other Ambulatory Visit: Payer: Self-pay | Admitting: Family Medicine

## 2015-08-18 ENCOUNTER — Other Ambulatory Visit: Payer: Self-pay | Admitting: Family Medicine

## 2015-08-21 ENCOUNTER — Encounter (INDEPENDENT_AMBULATORY_CARE_PROVIDER_SITE_OTHER): Payer: Medicare Other | Admitting: Ophthalmology

## 2015-08-21 DIAGNOSIS — I10 Essential (primary) hypertension: Secondary | ICD-10-CM

## 2015-08-21 DIAGNOSIS — E11311 Type 2 diabetes mellitus with unspecified diabetic retinopathy with macular edema: Secondary | ICD-10-CM | POA: Diagnosis not present

## 2015-08-21 DIAGNOSIS — H35033 Hypertensive retinopathy, bilateral: Secondary | ICD-10-CM

## 2015-08-21 DIAGNOSIS — H43813 Vitreous degeneration, bilateral: Secondary | ICD-10-CM

## 2015-08-21 DIAGNOSIS — E113513 Type 2 diabetes mellitus with proliferative diabetic retinopathy with macular edema, bilateral: Secondary | ICD-10-CM

## 2015-09-09 ENCOUNTER — Other Ambulatory Visit: Payer: Self-pay | Admitting: Family Medicine

## 2015-09-17 ENCOUNTER — Encounter (INDEPENDENT_AMBULATORY_CARE_PROVIDER_SITE_OTHER): Payer: Medicare Other | Admitting: Ophthalmology

## 2015-09-17 DIAGNOSIS — I10 Essential (primary) hypertension: Secondary | ICD-10-CM

## 2015-09-17 DIAGNOSIS — H35033 Hypertensive retinopathy, bilateral: Secondary | ICD-10-CM | POA: Diagnosis not present

## 2015-09-17 DIAGNOSIS — E113513 Type 2 diabetes mellitus with proliferative diabetic retinopathy with macular edema, bilateral: Secondary | ICD-10-CM

## 2015-09-17 DIAGNOSIS — H2513 Age-related nuclear cataract, bilateral: Secondary | ICD-10-CM

## 2015-09-17 DIAGNOSIS — E11311 Type 2 diabetes mellitus with unspecified diabetic retinopathy with macular edema: Secondary | ICD-10-CM | POA: Diagnosis not present

## 2015-09-17 DIAGNOSIS — H43813 Vitreous degeneration, bilateral: Secondary | ICD-10-CM

## 2015-10-14 ENCOUNTER — Encounter (INDEPENDENT_AMBULATORY_CARE_PROVIDER_SITE_OTHER): Payer: Medicare Other | Admitting: Ophthalmology

## 2015-10-14 DIAGNOSIS — E11311 Type 2 diabetes mellitus with unspecified diabetic retinopathy with macular edema: Secondary | ICD-10-CM

## 2015-10-14 DIAGNOSIS — H35033 Hypertensive retinopathy, bilateral: Secondary | ICD-10-CM | POA: Diagnosis not present

## 2015-10-14 DIAGNOSIS — E113513 Type 2 diabetes mellitus with proliferative diabetic retinopathy with macular edema, bilateral: Secondary | ICD-10-CM

## 2015-10-14 DIAGNOSIS — I10 Essential (primary) hypertension: Secondary | ICD-10-CM | POA: Diagnosis not present

## 2015-10-14 DIAGNOSIS — H43813 Vitreous degeneration, bilateral: Secondary | ICD-10-CM

## 2015-11-11 ENCOUNTER — Encounter (INDEPENDENT_AMBULATORY_CARE_PROVIDER_SITE_OTHER): Payer: Medicare Other | Admitting: Ophthalmology

## 2015-11-11 DIAGNOSIS — H35033 Hypertensive retinopathy, bilateral: Secondary | ICD-10-CM | POA: Diagnosis not present

## 2015-11-11 DIAGNOSIS — H43813 Vitreous degeneration, bilateral: Secondary | ICD-10-CM

## 2015-11-11 DIAGNOSIS — E113513 Type 2 diabetes mellitus with proliferative diabetic retinopathy with macular edema, bilateral: Secondary | ICD-10-CM

## 2015-11-11 DIAGNOSIS — E11311 Type 2 diabetes mellitus with unspecified diabetic retinopathy with macular edema: Secondary | ICD-10-CM | POA: Diagnosis not present

## 2015-11-11 DIAGNOSIS — I10 Essential (primary) hypertension: Secondary | ICD-10-CM | POA: Diagnosis not present

## 2015-11-26 DIAGNOSIS — R1310 Dysphagia, unspecified: Secondary | ICD-10-CM | POA: Diagnosis not present

## 2015-11-26 DIAGNOSIS — Z0000 Encounter for general adult medical examination without abnormal findings: Secondary | ICD-10-CM | POA: Diagnosis not present

## 2015-11-26 DIAGNOSIS — E11319 Type 2 diabetes mellitus with unspecified diabetic retinopathy without macular edema: Secondary | ICD-10-CM | POA: Diagnosis not present

## 2015-11-26 DIAGNOSIS — E039 Hypothyroidism, unspecified: Secondary | ICD-10-CM | POA: Diagnosis not present

## 2015-11-26 DIAGNOSIS — Z1231 Encounter for screening mammogram for malignant neoplasm of breast: Secondary | ICD-10-CM | POA: Diagnosis not present

## 2015-11-26 DIAGNOSIS — Z1211 Encounter for screening for malignant neoplasm of colon: Secondary | ICD-10-CM | POA: Diagnosis not present

## 2015-11-30 ENCOUNTER — Other Ambulatory Visit (INDEPENDENT_AMBULATORY_CARE_PROVIDER_SITE_OTHER): Payer: Medicare Other | Admitting: Ophthalmology

## 2015-11-30 DIAGNOSIS — E113511 Type 2 diabetes mellitus with proliferative diabetic retinopathy with macular edema, right eye: Secondary | ICD-10-CM | POA: Diagnosis not present

## 2015-11-30 DIAGNOSIS — E11311 Type 2 diabetes mellitus with unspecified diabetic retinopathy with macular edema: Secondary | ICD-10-CM

## 2015-12-17 ENCOUNTER — Encounter (INDEPENDENT_AMBULATORY_CARE_PROVIDER_SITE_OTHER): Payer: Medicare Other | Admitting: Ophthalmology

## 2015-12-17 DIAGNOSIS — H43813 Vitreous degeneration, bilateral: Secondary | ICD-10-CM | POA: Diagnosis not present

## 2015-12-17 DIAGNOSIS — E113513 Type 2 diabetes mellitus with proliferative diabetic retinopathy with macular edema, bilateral: Secondary | ICD-10-CM | POA: Diagnosis not present

## 2015-12-17 DIAGNOSIS — E11311 Type 2 diabetes mellitus with unspecified diabetic retinopathy with macular edema: Secondary | ICD-10-CM | POA: Diagnosis not present

## 2015-12-17 DIAGNOSIS — I10 Essential (primary) hypertension: Secondary | ICD-10-CM | POA: Diagnosis not present

## 2015-12-17 DIAGNOSIS — H35033 Hypertensive retinopathy, bilateral: Secondary | ICD-10-CM | POA: Diagnosis not present

## 2016-01-21 ENCOUNTER — Encounter (INDEPENDENT_AMBULATORY_CARE_PROVIDER_SITE_OTHER): Payer: Medicare Other | Admitting: Ophthalmology

## 2016-02-19 ENCOUNTER — Encounter (INDEPENDENT_AMBULATORY_CARE_PROVIDER_SITE_OTHER): Payer: Medicare Other | Admitting: Ophthalmology

## 2016-03-08 DIAGNOSIS — R1310 Dysphagia, unspecified: Secondary | ICD-10-CM | POA: Diagnosis not present

## 2016-03-17 ENCOUNTER — Encounter (INDEPENDENT_AMBULATORY_CARE_PROVIDER_SITE_OTHER): Payer: Medicare Other | Admitting: Ophthalmology

## 2016-03-17 DIAGNOSIS — I10 Essential (primary) hypertension: Secondary | ICD-10-CM | POA: Diagnosis not present

## 2016-03-17 DIAGNOSIS — E113513 Type 2 diabetes mellitus with proliferative diabetic retinopathy with macular edema, bilateral: Secondary | ICD-10-CM | POA: Diagnosis not present

## 2016-03-17 DIAGNOSIS — H43813 Vitreous degeneration, bilateral: Secondary | ICD-10-CM

## 2016-03-17 DIAGNOSIS — E11311 Type 2 diabetes mellitus with unspecified diabetic retinopathy with macular edema: Secondary | ICD-10-CM | POA: Diagnosis not present

## 2016-03-17 DIAGNOSIS — H35033 Hypertensive retinopathy, bilateral: Secondary | ICD-10-CM

## 2016-03-18 DIAGNOSIS — R5383 Other fatigue: Secondary | ICD-10-CM | POA: Diagnosis not present

## 2016-03-18 DIAGNOSIS — I10 Essential (primary) hypertension: Secondary | ICD-10-CM | POA: Diagnosis not present

## 2016-03-18 DIAGNOSIS — E1140 Type 2 diabetes mellitus with diabetic neuropathy, unspecified: Secondary | ICD-10-CM | POA: Diagnosis not present

## 2016-03-18 DIAGNOSIS — E039 Hypothyroidism, unspecified: Secondary | ICD-10-CM | POA: Diagnosis not present

## 2016-03-18 DIAGNOSIS — Z1159 Encounter for screening for other viral diseases: Secondary | ICD-10-CM | POA: Diagnosis not present

## 2016-03-31 ENCOUNTER — Encounter (INDEPENDENT_AMBULATORY_CARE_PROVIDER_SITE_OTHER): Payer: Medicare Other | Admitting: Ophthalmology

## 2016-03-31 DIAGNOSIS — I10 Essential (primary) hypertension: Secondary | ICD-10-CM | POA: Diagnosis not present

## 2016-03-31 DIAGNOSIS — E113513 Type 2 diabetes mellitus with proliferative diabetic retinopathy with macular edema, bilateral: Secondary | ICD-10-CM | POA: Diagnosis not present

## 2016-03-31 DIAGNOSIS — E11311 Type 2 diabetes mellitus with unspecified diabetic retinopathy with macular edema: Secondary | ICD-10-CM

## 2016-03-31 DIAGNOSIS — H35033 Hypertensive retinopathy, bilateral: Secondary | ICD-10-CM | POA: Diagnosis not present

## 2016-03-31 DIAGNOSIS — H43813 Vitreous degeneration, bilateral: Secondary | ICD-10-CM

## 2016-05-02 ENCOUNTER — Other Ambulatory Visit (INDEPENDENT_AMBULATORY_CARE_PROVIDER_SITE_OTHER): Payer: Medicare Other | Admitting: Ophthalmology

## 2016-05-02 DIAGNOSIS — E11311 Type 2 diabetes mellitus with unspecified diabetic retinopathy with macular edema: Secondary | ICD-10-CM | POA: Diagnosis not present

## 2016-05-02 DIAGNOSIS — E113511 Type 2 diabetes mellitus with proliferative diabetic retinopathy with macular edema, right eye: Secondary | ICD-10-CM | POA: Diagnosis not present

## 2016-05-11 ENCOUNTER — Other Ambulatory Visit (INDEPENDENT_AMBULATORY_CARE_PROVIDER_SITE_OTHER): Payer: Medicare Other | Admitting: Ophthalmology

## 2016-05-11 DIAGNOSIS — E11311 Type 2 diabetes mellitus with unspecified diabetic retinopathy with macular edema: Secondary | ICD-10-CM

## 2016-05-11 DIAGNOSIS — E113512 Type 2 diabetes mellitus with proliferative diabetic retinopathy with macular edema, left eye: Secondary | ICD-10-CM | POA: Diagnosis not present

## 2016-05-31 ENCOUNTER — Other Ambulatory Visit: Payer: Self-pay | Admitting: Nurse Practitioner

## 2016-05-31 DIAGNOSIS — Z1231 Encounter for screening mammogram for malignant neoplasm of breast: Secondary | ICD-10-CM

## 2016-06-15 ENCOUNTER — Ambulatory Visit
Admission: RE | Admit: 2016-06-15 | Discharge: 2016-06-15 | Disposition: A | Discharge status: Home / Self Care | Payer: Medicare Other | Source: Ambulatory Visit | Attending: Nurse Practitioner | Admitting: Nurse Practitioner

## 2016-06-15 DIAGNOSIS — Z1231 Encounter for screening mammogram for malignant neoplasm of breast: Secondary | ICD-10-CM | POA: Diagnosis not present

## 2016-07-06 ENCOUNTER — Encounter (INDEPENDENT_AMBULATORY_CARE_PROVIDER_SITE_OTHER): Payer: Medicare Other | Admitting: Ophthalmology

## 2016-07-06 DIAGNOSIS — I10 Essential (primary) hypertension: Secondary | ICD-10-CM

## 2016-07-06 DIAGNOSIS — E113513 Type 2 diabetes mellitus with proliferative diabetic retinopathy with macular edema, bilateral: Secondary | ICD-10-CM

## 2016-07-06 DIAGNOSIS — H35033 Hypertensive retinopathy, bilateral: Secondary | ICD-10-CM

## 2016-07-06 DIAGNOSIS — H43813 Vitreous degeneration, bilateral: Secondary | ICD-10-CM | POA: Diagnosis not present

## 2016-07-06 DIAGNOSIS — E11311 Type 2 diabetes mellitus with unspecified diabetic retinopathy with macular edema: Secondary | ICD-10-CM | POA: Diagnosis not present

## 2016-07-28 DIAGNOSIS — E113513 Type 2 diabetes mellitus with proliferative diabetic retinopathy with macular edema, bilateral: Secondary | ICD-10-CM | POA: Diagnosis not present

## 2016-07-28 DIAGNOSIS — H25813 Combined forms of age-related cataract, bilateral: Secondary | ICD-10-CM | POA: Diagnosis not present

## 2016-08-10 DIAGNOSIS — E1140 Type 2 diabetes mellitus with diabetic neuropathy, unspecified: Secondary | ICD-10-CM | POA: Diagnosis not present

## 2016-08-10 DIAGNOSIS — E039 Hypothyroidism, unspecified: Secondary | ICD-10-CM | POA: Diagnosis not present

## 2016-08-10 DIAGNOSIS — I10 Essential (primary) hypertension: Secondary | ICD-10-CM | POA: Diagnosis not present

## 2016-08-10 DIAGNOSIS — E784 Other hyperlipidemia: Secondary | ICD-10-CM | POA: Diagnosis not present

## 2016-08-25 ENCOUNTER — Encounter (INDEPENDENT_AMBULATORY_CARE_PROVIDER_SITE_OTHER): Payer: Medicare Other | Admitting: Ophthalmology

## 2016-08-25 DIAGNOSIS — E113513 Type 2 diabetes mellitus with proliferative diabetic retinopathy with macular edema, bilateral: Secondary | ICD-10-CM | POA: Diagnosis not present

## 2016-08-25 DIAGNOSIS — I10 Essential (primary) hypertension: Secondary | ICD-10-CM

## 2016-08-25 DIAGNOSIS — H35033 Hypertensive retinopathy, bilateral: Secondary | ICD-10-CM | POA: Diagnosis not present

## 2016-08-25 DIAGNOSIS — H2513 Age-related nuclear cataract, bilateral: Secondary | ICD-10-CM

## 2016-08-25 DIAGNOSIS — E11311 Type 2 diabetes mellitus with unspecified diabetic retinopathy with macular edema: Secondary | ICD-10-CM

## 2016-08-25 DIAGNOSIS — H43813 Vitreous degeneration, bilateral: Secondary | ICD-10-CM | POA: Diagnosis not present

## 2016-08-29 DIAGNOSIS — M79671 Pain in right foot: Secondary | ICD-10-CM | POA: Diagnosis not present

## 2016-08-29 DIAGNOSIS — M2041 Other hammer toe(s) (acquired), right foot: Secondary | ICD-10-CM | POA: Diagnosis not present

## 2016-08-29 DIAGNOSIS — I739 Peripheral vascular disease, unspecified: Secondary | ICD-10-CM | POA: Diagnosis not present

## 2016-08-29 DIAGNOSIS — M2042 Other hammer toe(s) (acquired), left foot: Secondary | ICD-10-CM | POA: Diagnosis not present

## 2016-09-29 ENCOUNTER — Encounter (INDEPENDENT_AMBULATORY_CARE_PROVIDER_SITE_OTHER): Payer: Medicare Other | Admitting: Ophthalmology

## 2016-09-29 DIAGNOSIS — H43813 Vitreous degeneration, bilateral: Secondary | ICD-10-CM

## 2016-09-29 DIAGNOSIS — I10 Essential (primary) hypertension: Secondary | ICD-10-CM

## 2016-09-29 DIAGNOSIS — E113513 Type 2 diabetes mellitus with proliferative diabetic retinopathy with macular edema, bilateral: Secondary | ICD-10-CM | POA: Diagnosis not present

## 2016-09-29 DIAGNOSIS — H35033 Hypertensive retinopathy, bilateral: Secondary | ICD-10-CM | POA: Diagnosis not present

## 2016-09-29 DIAGNOSIS — E11311 Type 2 diabetes mellitus with unspecified diabetic retinopathy with macular edema: Secondary | ICD-10-CM

## 2016-10-04 DIAGNOSIS — M2041 Other hammer toe(s) (acquired), right foot: Secondary | ICD-10-CM | POA: Diagnosis not present

## 2016-10-04 DIAGNOSIS — I739 Peripheral vascular disease, unspecified: Secondary | ICD-10-CM | POA: Diagnosis not present

## 2016-10-24 ENCOUNTER — Encounter (INDEPENDENT_AMBULATORY_CARE_PROVIDER_SITE_OTHER): Payer: Medicare Other | Admitting: Ophthalmology

## 2017-02-15 DIAGNOSIS — I739 Peripheral vascular disease, unspecified: Secondary | ICD-10-CM | POA: Diagnosis not present

## 2017-02-15 DIAGNOSIS — M2041 Other hammer toe(s) (acquired), right foot: Secondary | ICD-10-CM | POA: Diagnosis not present

## 2017-02-22 ENCOUNTER — Encounter: Payer: Self-pay | Admitting: *Deleted

## 2017-02-22 ENCOUNTER — Telehealth: Payer: Self-pay | Admitting: *Deleted

## 2017-02-22 NOTE — Telephone Encounter (Signed)
Left message x 2 02/21/17 and called again on 02/22/17 regarding doppler appointment on 03/02/17 and appointment 03/10/17 with Dr. Gwenlyn Found.  Informed patient I would mail information to her but did request a return confirmation call.

## 2017-02-22 NOTE — Telephone Encounter (Signed)
Left message regarding appointment dates and times---will mail information to patient kf

## 2017-02-27 ENCOUNTER — Other Ambulatory Visit (HOSPITAL_COMMUNITY): Payer: Self-pay | Admitting: Podiatry

## 2017-02-27 DIAGNOSIS — I739 Peripheral vascular disease, unspecified: Secondary | ICD-10-CM

## 2017-03-02 ENCOUNTER — Ambulatory Visit (HOSPITAL_COMMUNITY): Admission: RE | Admit: 2017-03-02 | Payer: Medicare Other | Source: Ambulatory Visit

## 2017-03-10 ENCOUNTER — Encounter: Payer: Self-pay | Admitting: Cardiovascular Disease

## 2017-03-10 ENCOUNTER — Ambulatory Visit (INDEPENDENT_AMBULATORY_CARE_PROVIDER_SITE_OTHER): Payer: Medicare Other | Admitting: Cardiovascular Disease

## 2017-03-10 VITALS — BP 171/85 | HR 81 | Ht 66.0 in | Wt 167.0 lb

## 2017-03-10 DIAGNOSIS — Z720 Tobacco use: Secondary | ICD-10-CM

## 2017-03-10 DIAGNOSIS — I739 Peripheral vascular disease, unspecified: Secondary | ICD-10-CM | POA: Diagnosis not present

## 2017-03-10 DIAGNOSIS — R0989 Other specified symptoms and signs involving the circulatory and respiratory systems: Secondary | ICD-10-CM

## 2017-03-10 DIAGNOSIS — I10 Essential (primary) hypertension: Secondary | ICD-10-CM | POA: Diagnosis not present

## 2017-03-10 DIAGNOSIS — M79606 Pain in leg, unspecified: Secondary | ICD-10-CM

## 2017-03-10 NOTE — Patient Instructions (Signed)
Medication Instructions: Your physician recommends that you continue on your current medications as directed. Please refer to the Current Medication list given to you today.   Testing/Procedures: Your physician has requested that you have a lower extremity arterial duplex. During this test, ultrasound is used to evaluate arterial blood flow in the legs. Allow one hour for this exam. There are no restrictions or special instructions.  Your physician has requested that you have an ankle brachial index (ABI). During this test an ultrasound and blood pressure cuff are used to evaluate the arteries that supply the arms and legs with blood. Allow thirty minutes for this exam. There are no restrictions or special instructions.  Your physician has requested that you have a carotid duplex. This test is an ultrasound of the carotid arteries in your neck. It looks at blood flow through these arteries that supply the brain with blood. Allow one hour for this exam. There are no restrictions or special instructions.   Follow-Up: Your physician wants you to follow-up in: 1 year with Dr. Gwenlyn Found. You will receive a reminder letter in the mail two months in advance. If you don't receive a letter, please call our office to schedule the follow-up appointment.  If you need a refill on your cardiac medications before your next appointment, please call your pharmacy.

## 2017-03-10 NOTE — Assessment & Plan Note (Signed)
History of critical limb ischemia with a gangrenous right second toe which has since been amputated status post diamondback orbital rotational atherectomy PTA and stenting of a highly calcified long segmentally occluded right SFA 12/25/13. Her right ABI increased from 0.77 up to 0.93 and her symptoms resolved. The stenting was performed 12/25/12 with a Supera stent. She developed some left buttock and hip pain remained down the lateral aspect of her leg with a rest and with ambulation which sounds more like sciatica. I can palpate pedal pulses on the right. We will get lower extremity arterial Doppler studies.

## 2017-03-10 NOTE — Progress Notes (Signed)
03/10/2017 [place]   May 14, 1950  [id number]  Primary Physician Minette Brine Primary Cardiologist: Lorretta Harp MD Lupe Carney, [place]  HPI:  Nicole Sellers is a 67 y.o.  mildly overweight towards [place] American female mother of one child and works as a [employer] manager. She was referred by Dr.Ajlouny from [facility] podiatry for evaluation of critical limb ischemia.I last saw her in the office 01/25/13. Her risk factors include a 25-50-pack-year history of tobacco abuse having quit in 2016. She has history of non-insulin-requiring diabetes untreated hypertension. She's never had a heart attack or stroke. She complains of dyspnea but denies chest pain. She's had claudication of less than 3 years which is lifestyle limiting and a gangrenous right second toe of the last 2-3 weeks. Dopplers her office suggested high-grade right SFA disease with an occluded anterior tibial as was the occluded left SFA.she underwent angiography several weeks ago revealing high-grade segmentally occluded and calcified proximal and mid right SFA stenosis with one vessel runoff via the peroneal. She underwent staged diamondback with rotational atherectomy, PTA and stenting of a long segment calcified subtotally occluded right SFA for critical limb ischemia (gangrenous right second toe). She will vessel runoff via the posterior tibial. Her Dopplers revealed increase in her ABIs from 0.77  To .93 post intervention. Resting pain has resolved. Her gangrenous toe was amputated by Dr. Fritzi Mandes.  Since I saw her 3 years ago she started well until recently when she developed some right hip and buttock pain remained down the lateral aspect of her leg which sounds more like sciatica.  Current Meds  Medication Sig  . albuterol (PROVENTIL HFA;VENTOLIN HFA) 108 (90 BASE) MCG/ACT inhaler Inhale 2 puffs into the lungs every 6 (six) hours as needed for wheezing or shortness of breath.  Marland Kitchen aspirin 81 MG tablet Take 81 mg by mouth  daily.  . B Complex Vitamins (VITAMIN B COMPLEX) TABS Take 1 tablet by mouth daily.  Marland Kitchen CALCIUM PO Take 1 tablet by mouth daily.  . cetirizine (ZYRTEC) 10 MG tablet Take 10 mg by mouth daily.  . cholecalciferol (VITAMIN D) 400 UNITS TABS tablet Take 400 Units by mouth daily.  Marland Kitchen gabapentin (NEURONTIN) 300 MG capsule Take 300 mg by mouth at bedtime.  . Glucosamine-Chondroitin (OSTEO BI-FLEX REGULAR STRENGTH PO) Take 1 tablet by mouth 2 (two) times daily.   Marland Kitchen levothyroxine (SYNTHROID, LEVOTHROID) 50 MCG tablet Take 50 mcg by mouth daily before breakfast.  . metFORMIN (GLUCOPHAGE) 500 MG tablet Take 1,000 mg by mouth 2 (two) times daily with a meal.  . Multiple Vitamin (MULTIVITAMIN) capsule Take 1 capsule by mouth daily.  Marland Kitchen omeprazole (PRILOSEC) 20 MG capsule TAKE ONE CAPSULE BY MOUTH EVERY DAY  . Tetrahydrozoline HCl (VISINE OP) Place 2 drops into both eyes daily as needed (dry eyes).  . triamterene-hydrochlorothiazide (DYAZIDE) 37.5-25 MG per capsule Take 1 capsule by mouth daily.     Allergies  Allergen Reactions  . Lisinopril     Bruising   . Plavix [Clopidogrel Bisulfate]     Social History   Socioeconomic History  . Marital status: Single    Spouse name: Not on file  . Number of children: Not on file  . Years of education: Not on file  . Highest education level: Not on file  Social Needs  . Financial resource strain: Not on file  . Food insecurity - worry: Not on file  . Food insecurity - inability: Not on file  . Transportation  needs - medical: Not on file  . Transportation needs - non-medical: Not on file  Occupational History  . Not on file  Tobacco Use  . Smoking status: Former Smoker    Packs/day: 0.50    Years: 48.00    Pack years: 24.00    Types: Cigarettes  . Smokeless tobacco: Never Used  Substance and Sexual Activity  . Alcohol use: No    Alcohol/week: 0.0 oz    Comment: 12/25/2012 "drink a beer maybe once/month"  . Drug use: No  . Sexual activity: Not  Currently  Other Topics Concern  . Not on file  Social History Narrative  . Not on file     Review of Systems: General: negative for chills, fever, night sweats or weight changes.  Cardiovascular: negative for chest pain, dyspnea on exertion, edema, orthopnea, palpitations, paroxysmal nocturnal dyspnea or shortness of breath Dermatological: negative for rash Respiratory: negative for cough or wheezing Urologic: negative for hematuria Abdominal: negative for nausea, vomiting, diarrhea, bright red blood per rectum, melena, or hematemesis Neurologic: negative for visual changes, syncope, or dizziness All other systems reviewed and are otherwise negative except as noted above.    Blood pressure (!) 171/85, pulse 81, height 5\' 6"  (1.676 m), weight 167 lb (75.8 kg).  General appearance: alert and no distress Neck: no adenopathy, no JVD, supple, symmetrical, trachea midline, thyroid not enlarged, symmetric, no tenderness/mass/nodules and Soft left carotid bruit Lungs: clear to auscultation bilaterally Heart: regular rate and rhythm, S1, S2 normal, no murmur, click, rub or gallop Extremities: extremities normal, atraumatic, no cyanosis or edema Pulses: 2+ and symmetric Skin: Skin color, texture, turgor normal. No rashes or lesions Neurologic: Alert and oriented X 3, normal strength and tone. Normal symmetric reflexes. Normal coordination and gait  EKG sinus rhythm at 81 without ST or T-wave changes.I  Personally reviewed this EKG.  ASSESSMENT AND PLAN:   PAD (peripheral artery disease) Rt ABI 0.7, Lt ABI 0.46 History of critical limb ischemia with a gangrenous right second toe which has since been amputated status post diamondback orbital rotational atherectomy PTA and stenting of a highly calcified long segmentally occluded right SFA 12/25/13. Her right ABI increased from 0.77 up to 0.93 and her symptoms resolved. The stenting was performed 12/25/12 with a Supera stent. She developed some  left buttock and hip pain remained down the lateral aspect of her leg with a rest and with ambulation which sounds more like sciatica. I can palpate pedal pulses on the right. We will get lower extremity arterial Doppler studies.  Essential hypertension History of essential hypertension blood pressure measured at 171/85. She is on Dyazide. Continue current meds at current dosing.  Tobacco abuse History of discontinued tobacco use in 2016.      Lorretta Harp MD FACP,FACC,FAHA, [facility] 03/10/2017 10:20 AM

## 2017-03-10 NOTE — Assessment & Plan Note (Signed)
History of essential hypertension blood pressure measured at 171/85. She is on Dyazide. Continue current meds at current dosing.

## 2017-03-10 NOTE — Assessment & Plan Note (Signed)
History of discontinued tobacco use in 2016.

## 2017-03-28 ENCOUNTER — Ambulatory Visit (HOSPITAL_COMMUNITY)
Admission: RE | Admit: 2017-03-28 | Discharge: 2017-03-28 | Disposition: A | Discharge status: Home / Self Care | Payer: Medicare Other | Source: Ambulatory Visit | Attending: Cardiovascular Disease | Admitting: Cardiovascular Disease

## 2017-03-28 DIAGNOSIS — I70201 Unspecified atherosclerosis of native arteries of extremities, right leg: Secondary | ICD-10-CM | POA: Insufficient documentation

## 2017-03-28 DIAGNOSIS — M79606 Pain in leg, unspecified: Secondary | ICD-10-CM | POA: Diagnosis not present

## 2017-03-28 DIAGNOSIS — R0989 Other specified symptoms and signs involving the circulatory and respiratory systems: Secondary | ICD-10-CM | POA: Diagnosis not present

## 2017-03-28 DIAGNOSIS — I6523 Occlusion and stenosis of bilateral carotid arteries: Secondary | ICD-10-CM | POA: Diagnosis not present

## 2017-03-28 DIAGNOSIS — I739 Peripheral vascular disease, unspecified: Secondary | ICD-10-CM | POA: Diagnosis not present

## 2017-03-30 DIAGNOSIS — E119 Type 2 diabetes mellitus without complications: Secondary | ICD-10-CM | POA: Diagnosis not present

## 2017-03-31 ENCOUNTER — Telehealth: Payer: Self-pay | Admitting: Cardiovascular Disease

## 2017-03-31 NOTE — Telephone Encounter (Signed)
Nicole Sellers is returning a call .  Thanks

## 2017-03-31 NOTE — Telephone Encounter (Signed)
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 Repeat 12 months   Result Notes for VAS [place] LOWER EXTREMITY ARTERIAL DUPLEX   Notes recorded by Waylan Rocher, LPN on 4/44/5848 at 3:50 PM EDT Patient called. Left message for patient to call back, needs 1st avail appt. ------  Notes recorded by Lorretta Harp, MD on 03/30/2017 at 1:24 PM EDT Return office visit with me to discuss results at next available   * patient called w/results - scheduled for MD OV on 04/12/17

## 2017-04-12 ENCOUNTER — Ambulatory Visit: Payer: Medicare Other | Admitting: Cardiovascular Disease

## 2017-04-14 ENCOUNTER — Ambulatory Visit (INDEPENDENT_AMBULATORY_CARE_PROVIDER_SITE_OTHER): Payer: Medicare Other | Admitting: Cardiovascular Disease

## 2017-04-14 ENCOUNTER — Encounter: Payer: Self-pay | Admitting: Cardiovascular Disease

## 2017-04-14 VITALS — BP 84/62 | HR 78 | Ht 66.5 in | Wt 165.8 lb

## 2017-04-14 DIAGNOSIS — I6522 Occlusion and stenosis of left carotid artery: Secondary | ICD-10-CM | POA: Diagnosis not present

## 2017-04-14 DIAGNOSIS — I779 Disorder of arteries and arterioles, unspecified: Secondary | ICD-10-CM | POA: Insufficient documentation

## 2017-04-14 DIAGNOSIS — I739 Peripheral vascular disease, unspecified: Secondary | ICD-10-CM

## 2017-04-14 NOTE — Patient Instructions (Addendum)
   [facility] [address] [place] [address] Dept: [phone] Loc: [place]  04/14/2017  You are scheduled for a Peripheral Angiogram on Monday, April 29 with Dr. Quay Burow.  1. Please arrive at the [facility] (Main Entrance [facility] at [facility]: [address] [place], [place] [address] at 5:30 AM (two hours before your procedure to ensure your preparation). Free valet parking service is available.   Special note: Every effort is made to have your procedure done on time. Please understand that emergencies sometimes delay scheduled procedures.  2. Diet: Do not eat or drink anything after midnight prior to your procedure except sips of water to take medications.  3. Labs: Please come back next week for blood work.  4. Medication instructions in preparation for your procedure:  HOLD Metformin for 24 hours prior to procedure.  On the morning of your procedure, take your Aspirin and any morning medicines NOT listed above.  You may use sips of water.  5. Plan for one night stay--bring personal belongings. 6. Bring a current list of your medications and current insurance cards. 7. You MUST have a responsible person to drive you home. 8. Someone MUST be with you the first 24 hours after you arrive home or your discharge will be delayed. 9. Please wear clothes that are easy to get on and off and wear slip-on shoes.  Thank you for allowing [place] to care for you!   -- [facility]  ------------------------------------------------------------------------------------------------------------------------ Post-procedure follow-up:  1 week after 05/08/17: Your physician has requested that you have a lower extremity arterial duplex. During this test, ultrasound is used to evaluate arterial blood flow in the legs. Allow one hour for this exam.  There are no restrictions or special instructions.  Your physician has requested that you have an ankle brachial index (ABI). During this test an ultrasound and blood pressure cuff are used to evaluate the arteries that supply the arms and legs with blood. Allow thirty minutes for this exam. There are no restrictions or special instructions.  Your physician recommends that you schedule a follow-up appointment in: 2 weeks after 05/08/17 with Dr. Gwenlyn Found.

## 2017-04-14 NOTE — Assessment & Plan Note (Signed)
History of PAD and critical limb ischemia status post right SFA intervention by myself using rotational atherectomy and stenting 12/25/12. I placed 3 stents (2 IDEV, , 1 absolute Pro). She's had recurrent claudication with a decline in her right ABI .93 - .62  With high frequency signals in her right iliac and mid right SFA. Based on this and her symptoms would decide to proceed with angiography and potential endovascular therapy for lifestyle limiting claudication.

## 2017-04-14 NOTE — Progress Notes (Signed)
04/14/2017 Nicole Sellers   05-Jul-1950  [id number]  Primary Physician Minette Brine Primary Cardiologist: Lorretta Harp MD Lupe Carney, [place]  HPI:  Nicole Sellers is a 67 y.o.  mildly overweight towards [place] American female mother of one child and works as a [employer]ar[employer] manager. She was referred by Dr.Ajlouny from [facility] podiatry for evaluation of critical limb ischemia.I last saw her in the office 03/10/17. Her risk factors include a 25-50-pack-year history of tobacco abuse having quit in 2016. She has history of non-insulin-requiring diabetes untreated hypertension. She's never had a heart attack or stroke. She complains of dyspnea but denies chest pain. She's had claudication of less than 3 years which is lifestyle limiting and a gangrenous right second toe of the last 2-3 weeks. Dopplers her office suggested high-grade right SFA disease with an occluded anterior tibial as was the occluded left SFA.she underwent angiography several weeks ago revealing high-grade segmentally occluded and calcified proximal and mid right SFA stenosis with one vessel runoff via the peroneal. She underwent staged diamondback with rotational atherectomy, PTA and stenting of a long segment calcified subtotally occluded right SFA for critical limb ischemia (gangrenous right second toe). She will vessel runoff via the posterior tibial. Her Dopplers revealed increase in her ABIs from 0.77 To .93 post intervention. Resting pain has resolved. Her gangrenous toe was amputated by Dr. Fritzi Mandes.  Since I saw her 3 years ago she started well until recently when she developed some right hip and buttock pain remained down the lateral aspect of her leg which sounds more like sciatica. Recent lower actually arterial Doppler studies reveal a decline in her right ABI from near normal down to 0.62 the high-frequency signal in her right iliac as well as mid right SFA within her stented segment.     Current Meds  Medication Sig  .  albuterol (PROVENTIL HFA;VENTOLIN HFA) 108 (90 BASE) MCG/ACT inhaler Inhale 2 puffs into the lungs every 6 (six) hours as needed for wheezing or shortness of breath.  Marland Kitchen aspirin 81 MG tablet Take 81 mg by mouth daily.  . B Complex Vitamins (VITAMIN B COMPLEX) TABS Take 1 tablet by mouth daily.  Marland Kitchen CALCIUM PO Take 1 tablet by mouth daily.  . cetirizine (ZYRTEC) 10 MG tablet Take 10 mg by mouth daily.  . cholecalciferol (VITAMIN D) 400 UNITS TABS tablet Take 400 Units by mouth daily.  Marland Kitchen gabapentin (NEURONTIN) 300 MG capsule Take 300 mg by mouth at bedtime.  . Glucosamine-Chondroitin (OSTEO BI-FLEX REGULAR STRENGTH PO) Take 1 tablet by mouth 2 (two) times daily.   Marland Kitchen levothyroxine (SYNTHROID, LEVOTHROID) 50 MCG tablet Take 50 mcg by mouth daily before breakfast.  . metFORMIN (GLUCOPHAGE) 500 MG tablet Take 1,000 mg by mouth 2 (two) times daily with a meal.  . Multiple Vitamin (MULTIVITAMIN) capsule Take 1 capsule by mouth daily.  Marland Kitchen omeprazole (PRILOSEC) 20 MG capsule TAKE ONE CAPSULE BY MOUTH EVERY DAY  . Tetrahydrozoline HCl (VISINE OP) Place 2 drops into both eyes daily as needed (dry eyes).  . triamterene-hydrochlorothiazide (DYAZIDE) 37.5-25 MG per capsule Take 1 capsule by mouth daily.     Allergies  Allergen Reactions  . Lisinopril     Bruising   . Plavix [Clopidogrel Bisulfate]     Social History   Socioeconomic History  . Marital status: Single    Spouse name: Not on file  . Number of children: Not on file  . Years of education: Not on file  .  Highest education level: Not on file  Occupational History  . Not on file  Social Needs  . Financial resource strain: Not on file  . Food insecurity:    Worry: Not on file    Inability: Not on file  . Transportation needs:    Medical: Not on file    Non-medical: Not on file  Tobacco Use  . Smoking status: Former Smoker    Packs/day: 0.50    Years: 48.00    Pack years: 24.00    Types: Cigarettes  . Smokeless tobacco: Never Used    Substance and Sexual Activity  . Alcohol use: No    Alcohol/week: 0.0 oz    Comment: 12/25/2012 "drink a beer maybe once/month"  . Drug use: No  . Sexual activity: Not Currently  Lifestyle  . Physical activity:    Days per week: Not on file    Minutes per session: Not on file  . Stress: Not on file  Relationships  . Social connections:    Talks on phone: Not on file    Gets together: Not on file    Attends religious service: Not on file    Active member of club or organization: Not on file    Attends meetings of clubs or organizations: Not on file    Relationship status: Not on file  . Intimate partner violence:    Fear of current or ex partner: Not on file    Emotionally abused: Not on file    Physically abused: Not on file    Forced sexual activity: Not on file  Other Topics Concern  . Not on file  Social History Narrative  . Not on file     Review of Systems: General: negative for chills, fever, night sweats or weight changes.  Cardiovascular: negative for chest pain, dyspnea on exertion, edema, orthopnea, palpitations, paroxysmal nocturnal dyspnea or shortness of breath Dermatological: negative for rash Respiratory: negative for cough or wheezing Urologic: negative for hematuria Abdominal: negative for nausea, vomiting, diarrhea, bright red blood per rectum, melena, or hematemesis Neurologic: negative for visual changes, syncope, or dizziness All other systems reviewed and are otherwise negative except as noted above.    Blood pressure (!) 84/62, pulse 78, height 5' 6.5" (1.689 m), weight 165 lb 12.8 oz (75.2 kg), SpO2 98 %.  General appearance: alert and no distress Neck: no adenopathy, no JVD, supple, symmetrical, trachea midline, thyroid not enlarged, symmetric, no tenderness/mass/nodules and soft left carotid bruit Lungs: clear to auscultation bilaterally Heart: regular rate and rhythm, S1, S2 normal, no murmur, click, rub or gallop Extremities: extremities  normal, atraumatic, no cyanosis or edema Pulses: diminished pedal pulses Skin: Skin color, texture, turgor normal. No rashes or lesions Neurologic: Alert and oriented X 3, normal strength and tone. Normal symmetric reflexes. Normal coordination and gait  EKG not performed today  ASSESSMENT AND PLAN:   PAD (peripheral artery disease) Rt ABI 0.7, Lt ABI 0.46 History of PAD and critical limb ischemia status post right SFA intervention by myself using rotational atherectomy and stenting 12/25/12. I placed 3 stents (2 IDEV, , 1 absolute Pro). She's had recurrent claudication with a decline in her right ABI .93 - .62  With high frequency signals in her right iliac and mid right SFA. Based on this and her symptoms would decide to proceed with angiography and potential endovascular therapy for lifestyle limiting claudication.  Carotid artery disease (HCC) Left carotid bruit with recent carotid Dopplers performed that revealed moderate left ICA  stenosis. With we will repeat this on annual basis.      Lorretta Harp MD FACP,FACC,FAHA, [facility] 04/14/2017 11:52 AM

## 2017-04-14 NOTE — Assessment & Plan Note (Signed)
Left carotid bruit with recent carotid Dopplers performed that revealed moderate left ICA stenosis. With we will repeat this on annual basis.

## 2017-04-19 DIAGNOSIS — I10 Essential (primary) hypertension: Secondary | ICD-10-CM | POA: Diagnosis not present

## 2017-04-19 DIAGNOSIS — E1140 Type 2 diabetes mellitus with diabetic neuropathy, unspecified: Secondary | ICD-10-CM | POA: Diagnosis not present

## 2017-04-19 DIAGNOSIS — E559 Vitamin D deficiency, unspecified: Secondary | ICD-10-CM | POA: Diagnosis not present

## 2017-04-19 DIAGNOSIS — E039 Hypothyroidism, unspecified: Secondary | ICD-10-CM | POA: Diagnosis not present

## 2017-04-27 DIAGNOSIS — Z01818 Encounter for other preprocedural examination: Secondary | ICD-10-CM | POA: Diagnosis not present

## 2017-04-27 DIAGNOSIS — I739 Peripheral vascular disease, unspecified: Secondary | ICD-10-CM | POA: Diagnosis not present

## 2017-04-28 LAB — CBC WITH DIFFERENTIAL/PLATELET
Basophils Absolute: 0 10*3/uL (ref 0.0–0.2)
Basos: 1 %
EOS (ABSOLUTE): 0.2 10*3/uL (ref 0.0–0.4)
Eos: 4 %
Hematocrit: 34 % (ref 34.0–46.6)
Hemoglobin: 11.3 g/dL (ref 11.1–15.9)
Immature Grans (Abs): 0 10*3/uL (ref 0.0–0.1)
Immature Granulocytes: 0 %
Lymphocytes Absolute: 2.2 10*3/uL (ref 0.7–3.1)
Lymphs: 41 %
MCH: 29.9 pg (ref 26.6–33.0)
MCHC: 33.2 g/dL (ref 31.5–35.7)
MCV: 90 fL (ref 79–97)
Monocytes Absolute: 0.2 10*3/uL (ref 0.1–0.9)
Monocytes: 4 %
Neutrophils Absolute: 2.6 10*3/uL (ref 1.4–7.0)
Neutrophils: 50 %
Platelets: 418 10*3/uL — ABNORMAL HIGH (ref 150–379)
RBC: 3.78 x10E6/uL (ref 3.77–5.28)
RDW: 13.3 % (ref 12.3–15.4)
WBC: 5.3 10*3/uL (ref 3.4–10.8)

## 2017-04-28 LAB — BASIC METABOLIC PANEL
BUN/Creatinine Ratio: 17 (ref 12–28)
BUN: 19 mg/dL (ref 8–27)
CO2: 26 mmol/L (ref 20–29)
Calcium: 10.4 mg/dL — ABNORMAL HIGH (ref 8.7–10.3)
Chloride: 99 mmol/L (ref 96–106)
Creatinine, Ser: 1.15 mg/dL — ABNORMAL HIGH (ref 0.57–1.00)
GFR calc Af Amer: 56 mL/min/{1.73_m2} — ABNORMAL LOW (ref >59)
GFR calc non Af Amer: 49 mL/min/{1.73_m2} — ABNORMAL LOW (ref >59)
Glucose: 140 mg/dL — ABNORMAL HIGH (ref 65–99)
Potassium: 5.2 mmol/L (ref 3.5–5.2)
Sodium: 139 mmol/L (ref 134–144)

## 2017-04-28 LAB — APTT: aPTT: 26 s (ref 24–33)

## 2017-04-28 LAB — PROTIME-INR
INR: 1 (ref 0.8–1.2)
Prothrombin Time: 9.9 s (ref 9.1–12.0)

## 2017-05-02 ENCOUNTER — Ambulatory Visit
Admission: RE | Admit: 2017-05-02 | Discharge: 2017-05-02 | Disposition: A | Discharge status: Home / Self Care | Payer: Medicare Other | Source: Ambulatory Visit | Attending: Cardiovascular Disease | Admitting: Cardiovascular Disease

## 2017-05-02 DIAGNOSIS — R05 Cough: Secondary | ICD-10-CM | POA: Diagnosis not present

## 2017-05-02 DIAGNOSIS — I739 Peripheral vascular disease, unspecified: Secondary | ICD-10-CM

## 2017-05-04 ENCOUNTER — Other Ambulatory Visit: Payer: Self-pay | Admitting: Cardiovascular Disease

## 2017-05-04 DIAGNOSIS — I739 Peripheral vascular disease, unspecified: Secondary | ICD-10-CM

## 2017-05-08 ENCOUNTER — Ambulatory Visit (HOSPITAL_COMMUNITY)
Admission: RE | Disposition: A | Discharge status: Home / Self Care | Payer: Self-pay | Source: Ambulatory Visit | Attending: Cardiovascular Disease

## 2017-05-08 ENCOUNTER — Other Ambulatory Visit: Payer: Self-pay

## 2017-05-08 ENCOUNTER — Encounter (HOSPITAL_COMMUNITY): Payer: Self-pay | Admitting: General Practice

## 2017-05-08 ENCOUNTER — Ambulatory Visit (HOSPITAL_COMMUNITY)
Admission: RE | Admit: 2017-05-08 | Discharge: 2017-05-09 | Disposition: A | Discharge status: Home / Self Care | Payer: Medicare Other | Source: Ambulatory Visit | Attending: Cardiovascular Disease | Admitting: Cardiovascular Disease

## 2017-05-08 DIAGNOSIS — Z888 Allergy status to other drugs, medicaments and biological substances status: Secondary | ICD-10-CM | POA: Insufficient documentation

## 2017-05-08 DIAGNOSIS — Z79890 Hormone replacement therapy (postmenopausal): Secondary | ICD-10-CM | POA: Diagnosis not present

## 2017-05-08 DIAGNOSIS — I10 Essential (primary) hypertension: Secondary | ICD-10-CM | POA: Diagnosis not present

## 2017-05-08 DIAGNOSIS — Z7984 Long term (current) use of oral hypoglycemic drugs: Secondary | ICD-10-CM | POA: Diagnosis not present

## 2017-05-08 DIAGNOSIS — Z7982 Long term (current) use of aspirin: Secondary | ICD-10-CM | POA: Insufficient documentation

## 2017-05-08 DIAGNOSIS — Z95828 Presence of other vascular implants and grafts: Secondary | ICD-10-CM | POA: Insufficient documentation

## 2017-05-08 DIAGNOSIS — I739 Peripheral vascular disease, unspecified: Secondary | ICD-10-CM | POA: Diagnosis present

## 2017-05-08 DIAGNOSIS — Z89421 Acquired absence of other right toe(s): Secondary | ICD-10-CM | POA: Insufficient documentation

## 2017-05-08 DIAGNOSIS — I70211 Atherosclerosis of native arteries of extremities with intermittent claudication, right leg: Secondary | ICD-10-CM | POA: Insufficient documentation

## 2017-05-08 DIAGNOSIS — Z9889 Other specified postprocedural states: Secondary | ICD-10-CM | POA: Diagnosis not present

## 2017-05-08 DIAGNOSIS — Z87891 Personal history of nicotine dependence: Secondary | ICD-10-CM | POA: Insufficient documentation

## 2017-05-08 DIAGNOSIS — Z79899 Other long term (current) drug therapy: Secondary | ICD-10-CM | POA: Insufficient documentation

## 2017-05-08 DIAGNOSIS — Z91018 Allergy to other foods: Secondary | ICD-10-CM | POA: Diagnosis not present

## 2017-05-08 DIAGNOSIS — Z959 Presence of cardiac and vascular implant and graft, unspecified: Secondary | ICD-10-CM

## 2017-05-08 DIAGNOSIS — E119 Type 2 diabetes mellitus without complications: Secondary | ICD-10-CM | POA: Insufficient documentation

## 2017-05-08 HISTORY — PX: PERIPHERAL VASCULAR BALLOON ANGIOPLASTY: CATH118281

## 2017-05-08 HISTORY — PX: PERIPHERAL VASCULAR INTERVENTION: CATH118257

## 2017-05-08 HISTORY — PX: LOWER EXTREMITY INTERVENTION: CATH118252

## 2017-05-08 LAB — GLUCOSE, CAPILLARY
Glucose-Capillary: 112 mg/dL — ABNORMAL HIGH (ref 65–99)
Glucose-Capillary: 136 mg/dL — ABNORMAL HIGH (ref 65–99)
Glucose-Capillary: 142 mg/dL — ABNORMAL HIGH (ref 65–99)
Glucose-Capillary: 142 mg/dL — ABNORMAL HIGH (ref 65–99)

## 2017-05-08 LAB — POCT ACTIVATED CLOTTING TIME
Activated Clotting Time: 219 seconds
Activated Clotting Time: 235 seconds
Activated Clotting Time: 268 seconds
Activated Clotting Time: 175 seconds

## 2017-05-08 SURGERY — LOWER EXTREMITY INTERVENTION
Anesthesia: LOCAL | Laterality: Right

## 2017-05-08 MED ORDER — ASPIRIN EC 81 MG PO TBEC: 81.0000 mg | DELAYED_RELEASE_TABLET | Freq: Every day | ORAL | Status: DC

## 2017-05-08 MED ORDER — HEPARIN (PORCINE) IN NACL 2-0.9 UNITS/ML
INTRAMUSCULAR | Status: AC | PRN
  Administered 2017-05-08 (×2): 500 mL

## 2017-05-08 MED ORDER — LIDOCAINE HCL (PF) 1 % IJ SOLN
INTRAMUSCULAR | Status: AC
  Filled 2017-05-08: qty 30

## 2017-05-08 MED ORDER — CLOPIDOGREL BISULFATE 300 MG PO TABS
ORAL_TABLET | ORAL | Status: AC
  Filled 2017-05-08: qty 1

## 2017-05-08 MED ORDER — CLOPIDOGREL BISULFATE 300 MG PO TABS
ORAL_TABLET | ORAL | Status: DC | PRN
  Administered 2017-05-08: 300 mg via ORAL

## 2017-05-08 MED ORDER — GLIMEPIRIDE 1 MG PO TABS
1.0000 mg | ORAL_TABLET | Freq: Two times a day (BID) | ORAL | Status: DC
  Administered 2017-05-08 – 2017-05-09 (×2): 1 mg via ORAL
  Filled 2017-05-08 (×2): qty 1

## 2017-05-08 MED ORDER — ATORVASTATIN CALCIUM 80 MG PO TABS: 80.0000 mg | ORAL_TABLET | Freq: Every day | ORAL | Status: DC

## 2017-05-08 MED ORDER — PANTOPRAZOLE SODIUM 40 MG PO TBEC
40.0000 mg | DELAYED_RELEASE_TABLET | Freq: Every day | ORAL | Status: DC
  Administered 2017-05-08 – 2017-05-09 (×2): 40 mg via ORAL
  Filled 2017-05-08 (×2): qty 1

## 2017-05-08 MED ORDER — HEPARIN SODIUM (PORCINE) 1000 UNIT/ML IJ SOLN
INTRAMUSCULAR | Status: DC | PRN
  Administered 2017-05-08: 3000 [IU] via INTRAVENOUS
  Administered 2017-05-08: 8000 [IU] via INTRAVENOUS
  Administered 2017-05-08: 2500 [IU] via INTRAVENOUS

## 2017-05-08 MED ORDER — SODIUM CHLORIDE 0.9 % WEIGHT BASED INFUSION: 1.0000 mL/kg/h | INTRAVENOUS | Status: DC

## 2017-05-08 MED ORDER — SODIUM CHLORIDE 0.9 % IV SOLN
INTRAVENOUS | Status: AC
  Administered 2017-05-08: 17:00:00 via INTRAVENOUS

## 2017-05-08 MED ORDER — ALBUTEROL SULFATE (2.5 MG/3ML) 0.083% IN NEBU: 2.5000 mg | INHALATION_SOLUTION | Freq: Four times a day (QID) | RESPIRATORY_TRACT | Status: DC | PRN

## 2017-05-08 MED ORDER — MORPHINE SULFATE (PF) 2 MG/ML IV SOLN: 2.0000 mg | INTRAVENOUS | Status: DC | PRN

## 2017-05-08 MED ORDER — ASPIRIN EC 81 MG PO TBEC
81.0000 mg | DELAYED_RELEASE_TABLET | Freq: Every day | ORAL | Status: DC
  Administered 2017-05-09: 81 mg via ORAL
  Filled 2017-05-08: qty 1

## 2017-05-08 MED ORDER — ONDANSETRON HCL 4 MG/2ML IJ SOLN: 4.0000 mg | Freq: Four times a day (QID) | INTRAMUSCULAR | Status: DC | PRN

## 2017-05-08 MED ORDER — LIDOCAINE HCL (PF) 1 % IJ SOLN
INTRAMUSCULAR | Status: DC | PRN
  Administered 2017-05-08: 20 mL

## 2017-05-08 MED ORDER — LEVOTHYROXINE SODIUM 50 MCG PO TABS
50.0000 ug | ORAL_TABLET | Freq: Every day | ORAL | Status: DC
  Administered 2017-05-09: 06:00:00 50 ug via ORAL
  Filled 2017-05-08: qty 1

## 2017-05-08 MED ORDER — IOPAMIDOL (ISOVUE-370) INJECTION 76%
INTRAVENOUS | Status: AC
  Filled 2017-05-08: qty 100

## 2017-05-08 MED ORDER — FENTANYL CITRATE (PF) 100 MCG/2ML IJ SOLN
INTRAMUSCULAR | Status: AC
  Filled 2017-05-08: qty 2

## 2017-05-08 MED ORDER — ASPIRIN 81 MG PO CHEW
CHEWABLE_TABLET | ORAL | Status: AC
  Administered 2017-05-08: 81 mg via ORAL
  Filled 2017-05-08: qty 1

## 2017-05-08 MED ORDER — HYDRALAZINE HCL 20 MG/ML IJ SOLN
INTRAMUSCULAR | Status: AC
  Filled 2017-05-08: qty 1

## 2017-05-08 MED ORDER — HYDRALAZINE HCL 20 MG/ML IJ SOLN: 5.0000 mg | INTRAMUSCULAR | Status: DC | PRN

## 2017-05-08 MED ORDER — SODIUM CHLORIDE 0.9% FLUSH: 3.0000 mL | INTRAVENOUS | Status: DC | PRN

## 2017-05-08 MED ORDER — IODIXANOL 320 MG/ML IV SOLN
INTRAVENOUS | Status: DC | PRN
  Administered 2017-05-08: 200 mL via INTRAVENOUS

## 2017-05-08 MED ORDER — LABETALOL HCL 5 MG/ML IV SOLN
10.0000 mg | INTRAVENOUS | Status: DC | PRN
  Administered 2017-05-08: 13:00:00 10 mg via INTRAVENOUS
  Filled 2017-05-08: qty 4

## 2017-05-08 MED ORDER — SODIUM CHLORIDE 0.9% FLUSH
3.0000 mL | Freq: Two times a day (BID) | INTRAVENOUS | Status: DC
  Administered 2017-05-08 – 2017-05-09 (×2): 3 mL via INTRAVENOUS

## 2017-05-08 MED ORDER — MIDAZOLAM HCL 2 MG/2ML IJ SOLN
INTRAMUSCULAR | Status: AC
  Filled 2017-05-08: qty 2

## 2017-05-08 MED ORDER — ATORVASTATIN CALCIUM 10 MG PO TABS: 10.0000 mg | ORAL_TABLET | Freq: Every day | ORAL | Status: DC

## 2017-05-08 MED ORDER — TRIAMTERENE-HCTZ 75-50 MG PO TABS
1.0000 | ORAL_TABLET | Freq: Every day | ORAL | Status: DC
  Administered 2017-05-09: 1 via ORAL
  Filled 2017-05-08: qty 1

## 2017-05-08 MED ORDER — HEPARIN SODIUM (PORCINE) 1000 UNIT/ML IJ SOLN
INTRAMUSCULAR | Status: AC
  Filled 2017-05-08: qty 1

## 2017-05-08 MED ORDER — SODIUM CHLORIDE 0.9 % WEIGHT BASED INFUSION
3.0000 mL/kg/h | INTRAVENOUS | Status: DC
  Administered 2017-05-08: 3 mL/kg/h via INTRAVENOUS

## 2017-05-08 MED ORDER — ACETAMINOPHEN 325 MG PO TABS: 650.0000 mg | ORAL_TABLET | ORAL | Status: DC | PRN

## 2017-05-08 MED ORDER — HYDRALAZINE HCL 20 MG/ML IJ SOLN
INTRAMUSCULAR | Status: DC | PRN
  Administered 2017-05-08: 10 mg via INTRAVENOUS

## 2017-05-08 MED ORDER — ACETAMINOPHEN 500 MG PO TABS: 500.0000 mg | ORAL_TABLET | Freq: Every day | ORAL | Status: DC | PRN

## 2017-05-08 MED ORDER — HEPARIN (PORCINE) IN NACL 1000-0.9 UT/500ML-% IV SOLN
INTRAVENOUS | Status: AC
  Filled 2017-05-08: qty 1000

## 2017-05-08 MED ORDER — ATORVASTATIN CALCIUM 10 MG PO TABS
10.0000 mg | ORAL_TABLET | Freq: Every day | ORAL | Status: DC
  Administered 2017-05-09: 09:00:00 10 mg via ORAL
  Filled 2017-05-08: qty 1

## 2017-05-08 MED ORDER — FENTANYL CITRATE (PF) 100 MCG/2ML IJ SOLN
INTRAMUSCULAR | Status: DC | PRN
  Administered 2017-05-08: 25 ug via INTRAVENOUS

## 2017-05-08 MED ORDER — SODIUM CHLORIDE 0.9 % IV SOLN: 250.0000 mL | INTRAVENOUS | Status: DC | PRN

## 2017-05-08 MED ORDER — MIDAZOLAM HCL 2 MG/2ML IJ SOLN
INTRAMUSCULAR | Status: DC | PRN
  Administered 2017-05-08: 1 mg via INTRAVENOUS

## 2017-05-08 MED ORDER — ASPIRIN 81 MG PO CHEW
81.0000 mg | CHEWABLE_TABLET | ORAL | Status: AC
  Administered 2017-05-08: 81 mg via ORAL

## 2017-05-08 MED ORDER — CLOPIDOGREL BISULFATE 75 MG PO TABS
75.0000 mg | ORAL_TABLET | Freq: Every day | ORAL | Status: DC
  Administered 2017-05-09: 09:00:00 75 mg via ORAL
  Filled 2017-05-08: qty 1

## 2017-05-08 SURGICAL SUPPLY — 29 items
BALLN ADMIRAL INPACT 5X200 (BALLOONS) ×3
BALLN CHOCOLATE 5.0X120X120 (BALLOONS) ×3
BALLN IN.PACT DCB 5X120 (BALLOONS) ×3
BALLN MUSTANG 5.0X40 75 (BALLOONS) ×3
BALLN MUSTANG 6.0X40 75 (BALLOONS) ×3
BALLOON ADMIRAL INPACT 5X200 (BALLOONS) IMPLANT
BALLOON CHOCOLATE 5.0X120X120 (BALLOONS) IMPLANT
BALLOON MUSTANG 5.0X40 75 (BALLOONS) IMPLANT
BALLOON MUSTANG 6.0X40 75 (BALLOONS) IMPLANT
CATH ANGIO 5F PIGTAIL 65CM (CATHETERS) ×1 IMPLANT
CATH TEMPO 5F RIM 65CM (CATHETERS) ×1 IMPLANT
DCB IN.PACT 5X120 (BALLOONS) IMPLANT
DEVICE CONTINUOUS FLUSH (MISCELLANEOUS) ×1 IMPLANT
GLIDEWIRE ANGLED SS 035X260CM (WIRE) ×1 IMPLANT
KIT ENCORE 26 ADVANTAGE (KITS) ×1 IMPLANT
KIT PV (KITS) ×3 IMPLANT
SHEATH HIGHFLEX ANSEL 7FR 55CM (SHEATH) ×1 IMPLANT
SHEATH PINNACLE 7F 10CM (SHEATH) ×1 IMPLANT
STENT ABSOLUTE PRO 7X60X135 (Permanent Stent) ×1 IMPLANT
STOPCOCK MORSE 400PSI 3WAY (MISCELLANEOUS) ×2 IMPLANT
SYRINGE MEDRAD AVANTA MACH 7 (SYRINGE) ×1 IMPLANT
TAPE VIPERTRACK RADIOPAQ (MISCELLANEOUS) IMPLANT
TAPE VIPERTRACK RADIOPAQUE (MISCELLANEOUS) ×6
TRANSDUCER W/STOPCOCK (MISCELLANEOUS) ×3 IMPLANT
TRAY PV CATH (CUSTOM PROCEDURE TRAY) ×3 IMPLANT
TUBING CIL FLEX 10 FLL-RA (TUBING) ×1 IMPLANT
WIRE HITORQ VERSACORE ST 145CM (WIRE) ×1 IMPLANT
WIRE ROSEN-J .035X260CM (WIRE) ×1 IMPLANT
WIRE SPARTACORE .014X300CM (WIRE) ×1 IMPLANT

## 2017-05-08 NOTE — Progress Notes (Signed)
Called by RN significant B/P discrepancy in B/P- Rt arm is much higher than left suggesting LSCA stenosis. I told them to treat the higher B/P.   Kerin Ransom PA-C 05/08/2017 12:19 PM

## 2017-05-08 NOTE — Progress Notes (Signed)
Site area: left groin  Site Prior to Removal:  Level 0  Pressure Applied For 20 MINUTES    Minutes Beginning at 1435  Manual:   Yes.    Patient Status During Pull:  WNL   Post Pull Groin Site:  Level 0  Post Pull Instructions Given:  Yes.    Post Pull Pulses Present:  Yes.    Dressing Applied:  Yes.    Comments:  Tolerated procedure well

## 2017-05-09 DIAGNOSIS — Z959 Presence of cardiac and vascular implant and graft, unspecified: Secondary | ICD-10-CM | POA: Diagnosis not present

## 2017-05-09 DIAGNOSIS — Z7984 Long term (current) use of oral hypoglycemic drugs: Secondary | ICD-10-CM | POA: Diagnosis not present

## 2017-05-09 DIAGNOSIS — Z7982 Long term (current) use of aspirin: Secondary | ICD-10-CM | POA: Diagnosis not present

## 2017-05-09 DIAGNOSIS — E119 Type 2 diabetes mellitus without complications: Secondary | ICD-10-CM | POA: Diagnosis not present

## 2017-05-09 DIAGNOSIS — Z87891 Personal history of nicotine dependence: Secondary | ICD-10-CM | POA: Diagnosis not present

## 2017-05-09 DIAGNOSIS — Z9889 Other specified postprocedural states: Secondary | ICD-10-CM | POA: Diagnosis not present

## 2017-05-09 DIAGNOSIS — I70211 Atherosclerosis of native arteries of extremities with intermittent claudication, right leg: Secondary | ICD-10-CM | POA: Diagnosis not present

## 2017-05-09 DIAGNOSIS — Z91018 Allergy to other foods: Secondary | ICD-10-CM | POA: Diagnosis not present

## 2017-05-09 DIAGNOSIS — Z79899 Other long term (current) drug therapy: Secondary | ICD-10-CM | POA: Diagnosis not present

## 2017-05-09 DIAGNOSIS — Z79890 Hormone replacement therapy (postmenopausal): Secondary | ICD-10-CM | POA: Diagnosis not present

## 2017-05-09 DIAGNOSIS — I10 Essential (primary) hypertension: Secondary | ICD-10-CM | POA: Diagnosis not present

## 2017-05-09 DIAGNOSIS — I739 Peripheral vascular disease, unspecified: Secondary | ICD-10-CM | POA: Diagnosis not present

## 2017-05-09 DIAGNOSIS — Z888 Allergy status to other drugs, medicaments and biological substances status: Secondary | ICD-10-CM | POA: Diagnosis not present

## 2017-05-09 DIAGNOSIS — Z89421 Acquired absence of other right toe(s): Secondary | ICD-10-CM | POA: Diagnosis not present

## 2017-05-09 DIAGNOSIS — Z95828 Presence of other vascular implants and grafts: Secondary | ICD-10-CM | POA: Diagnosis not present

## 2017-05-09 LAB — CBC
HCT: 30.1 % — ABNORMAL LOW (ref 36.0–46.0)
Hemoglobin: 9.9 g/dL — ABNORMAL LOW (ref 12.0–15.0)
MCH: 29.2 pg (ref 26.0–34.0)
MCHC: 32.9 g/dL (ref 30.0–36.0)
MCV: 88.8 fL (ref 78.0–100.0)
Platelets: 388 10*3/uL (ref 150–400)
RBC: 3.39 MIL/uL — ABNORMAL LOW (ref 3.87–5.11)
RDW: 12.9 % (ref 11.5–15.5)
WBC: 6.4 10*3/uL (ref 4.0–10.5)

## 2017-05-09 LAB — BASIC METABOLIC PANEL
Anion gap: 9 (ref 5–15)
BUN: 22 mg/dL — ABNORMAL HIGH (ref 6–20)
CO2: 24 mmol/L (ref 22–32)
Calcium: 9.4 mg/dL (ref 8.9–10.3)
Chloride: 104 mmol/L (ref 101–111)
Creatinine, Ser: 0.86 mg/dL (ref 0.44–1.00)
GFR calc Af Amer: >60 mL/min (ref >60)
GFR calc non Af Amer: >60 mL/min (ref >60)
Glucose, Bld: 197 mg/dL — ABNORMAL HIGH (ref 65–99)
Potassium: 4.1 mmol/L (ref 3.5–5.1)
Sodium: 137 mmol/L (ref 135–145)

## 2017-05-09 LAB — GLUCOSE, CAPILLARY
Glucose-Capillary: 141 mg/dL — ABNORMAL HIGH (ref 65–99)
Glucose-Capillary: 216 mg/dL — ABNORMAL HIGH (ref 65–99)

## 2017-05-09 MED ORDER — ANGIOPLASTY BOOK
Freq: Once | Status: AC
  Administered 2017-05-09: 1
  Filled 2017-05-09: qty 1

## 2017-05-09 MED ORDER — CLOPIDOGREL BISULFATE 75 MG PO TABS: 75.0000 mg | ORAL_TABLET | Freq: Every day | ORAL | 1 refills | Status: DC

## 2017-05-09 MED ORDER — AMLODIPINE BESYLATE 5 MG PO TABS: 5.0000 mg | ORAL_TABLET | Freq: Every day | ORAL | 1 refills | Status: DC

## 2017-05-09 MED ORDER — AMLODIPINE BESYLATE 5 MG PO TABS
5.0000 mg | ORAL_TABLET | Freq: Every day | ORAL | Status: DC
  Administered 2017-05-09: 09:00:00 5 mg via ORAL
  Filled 2017-05-09: qty 1

## 2017-05-09 MED FILL — Heparin Sod (Porcine)-NaCl IV Soln 1000 Unit/500ML-0.9%: INTRAVENOUS | Qty: 1000 | Status: AC

## 2017-05-09 NOTE — Discharge Summary (Signed)
Discharge Summary    Patient ID: Nicole Sellers,  MRN: [id number], DOB/AGE: 69-13-1952 69 y.o.  Admit date: 05/08/2017 Discharge date: 05/09/2017  Primary Care Provider: Minette Brine Primary Cardiologist: Dr. Gwenlyn Found   Discharge Diagnoses    Active Problems:   S/P arterial stent, 12/25/13, successful diamondback orbital rotational arthrectomy, PTA using chocolate  balloon and stenting using I DEV stent of long segment calcified high-grade proximal and mid r   Claudication in peripheral vascular disease (HCC)  Allergies Allergies  Allergen Reactions  . Kiwi Extract Itching    Throat itching   . Lisinopril     Bruising   . Plavix [Clopidogrel Bisulfate]     Unknown reaction     Diagnostic Studies/Procedures    PV angiogram: 05/08/17  Final Impression: Successful chocolate balloon angioplasty of the entire right SFA stented segment for diffuse high-grade "in-stent restenosis followed by drug-eluting balloon angioplasty with self-expanding stenting of the right external iliac artery all performed for lifestyle in an claudication. The sheath will be removed once the ACT falls below 170 and pressure held. The patient will be hydrated overnight and discharged on the morning on dual antiplatelet therapy. She'll receive lower extremity arterial Doppler studies in our  [facility]  office next week and I will see her back in 2-3 weeks thereafter. She left the lab in stable condition.  Quay Burow. MD, [facility] _____________   History of Present Illness     67 y.o. AAF who was referred by Dr.Ajlouny from [facility] podiatry for evaluation of critical limb ischemia.Her risk factors include a 25-50-pack-year history of tobacco abusehaving quit in 2016.She has history of non-insulin-requiring diabetes untreated hypertension. She's never had a heart attack or stroke. She's had claudication of less than 3 years which is lifestyle limiting and a gangrenous right second toe of the last 2-3  weeks. Dopplers her office suggested high-grade right SFA disease with an occluded anterior tibial as was the occluded left SFA.she underwent angiography several weeks ago revealing high-grade segmentally occluded and calcified proximal and mid right SFA stenosis with one vessel runoff via the peroneal. She underwent staged diamondback with rotational atherectomy, PTA and stenting of a long segment calcified subtotally occluded right SFA for critical limb ischemia (gangrenous right second toe). Her Dopplers revealed increase in her ABIs from 0.77 to .93 post intervention. Her gangrenous toewas amputated by Dr. Fritzi Mandes. She recently developed some right hip and buttock pain remained down the lateral aspect of her leg which sounds more like sciatica. Recent lower actually arterial Doppler studies reveal a decline in her right ABI from near normal down to 0.62 the high-frequency signal in her right iliac as well as mid right SFA within her stented segment. Given this finding, she was referred back for outpatient PV angiogram.   Hospital Course     Underwent cath noted above with Dr. Gwenlyn Found with successful chocolate balloon angioplasty of the entire right SFA stented segment for diffuse high-grade "in-stent restenosis followed by drug-eluting balloon angioplasty with self-expanding stenting of the right external iliac artery. Plan for DAPT with ASA/plavix. No complications noted post cath. Felt well the following morning. Morning labs were stable. Ambulated in the hallway without difficulty. Blood pressures were noted to be elevated, therefore added amlodipine this admission.     General: Well developed, well nourished, female appearing in no acute distress. Head: Normocephalic, atraumatic.  Neck: Supple without bruits, JVD. Lungs:  Resp regular and unlabored, CTA. Heart: RRR, S1, S2, no S3, S4, or  murmur; no rub. Abdomen: Soft, non-tender, non-distended with normoactive bowel sounds. No hepatomegaly. No  rebound/guarding. No obvious abdominal masses. Extremities: No clubbing, cyanosis, edema. Distal pedal pulses are 2+ bilaterally. L femoral cath site stable without bruising or hematoma Neuro: Alert and oriented X 3. Moves all extremities spontaneously. Psych: Normal affect.  Nicole Sellers was seen by Dr. Angelena Form and determined stable for discharge home. Follow up in the office has been arranged. Medications are listed below.   _____________  Discharge Vitals Blood pressure (!) 176/62, pulse 87, temperature 98.5 F (36.9 C), temperature source Oral, resp. rate 20, height 5' 6.5" (1.689 m), weight 165 lb 5.5 oz (75 kg), SpO2 100 %.  Filed Weights   05/08/17 0606 05/09/17 0208  Weight: 167 lb (75.8 kg) 165 lb 5.5 oz (75 kg)    Labs & Radiologic Studies    CBC Recent Labs    05/09/17 0205  WBC 6.4  HGB 9.9*  HCT 30.1*  MCV 88.8  PLT 324   Basic Metabolic Panel Recent Labs    05/09/17 0205  NA 137  K 4.1  CL 104  CO2 24  GLUCOSE 197*  BUN 22*  CREATININE 0.86  CALCIUM 9.4   Liver Function Tests No results for input(s): AST, ALT, ALKPHOS, BILITOT, PROT, ALBUMIN in the last 72 hours. No results for input(s): LIPASE, AMYLASE in the last 72 hours. Cardiac Enzymes No results for input(s): CKTOTAL, CKMB, CKMBINDEX, TROPONINI in the last 72 hours. BNP Invalid input(s): POCBNP D-Dimer No results for input(s): DDIMER in the last 72 hours. Hemoglobin A1C No results for input(s): HGBA1C in the last 72 hours. Fasting Lipid Panel No results for input(s): CHOL, HDL, LDLCALC, TRIG, CHOLHDL, LDLDIRECT in the last 72 hours. Thyroid Function Tests No results for input(s): TSH, T4TOTAL, T3FREE, THYROIDAB in the last 72 hours.  Invalid input(s): FREET3 _____________  Dg Chest 2 View  Result Date: 05/03/2017 CLINICAL DATA:  Peripheral artery disease and claudication. Pre-op respiratory exam. Productive cough for approximately 1 year. EXAM: CHEST - 2 VIEW COMPARISON:   08/07/2013 FINDINGS: The heart size and mediastinal contours are within normal limits. Aortic atherosclerosis. Both lungs are clear. Lower thoracic spine degenerative disc disease again noted. IMPRESSION: No active cardiopulmonary disease. Electronically Signed   By: Earle Gell M.D.   On: 05/03/2017 08:14   Disposition   Pt is being discharged home today in good condition.  Follow-up Plans & Appointments    Follow-up Information    [facility] Follow up on 05/16/2017.   Specialty:  Cardiology Why:  at [place] for your follow up dopplers.  Contact information: [address] [place]       Lorretta Harp, MD Follow up on 05/23/2017.   Specialties:  Cardiology, Radiology Why:  at 10:30am for your follow up appt.  Contact information: [address] [place] [address] [phone]          Discharge Instructions    Diet - low sodium heart healthy   Complete by:  As directed    Discharge instructions   Complete by:  As directed    Groin Site Care Refer to this sheet in the next few weeks. These instructions provide you with information on caring for yourself after your procedure. Your caregiver may also give you more specific instructions. Your treatment has been planned according to current medical practices, but problems sometimes occur. Call your caregiver if you have any problems or  questions after your procedure. HOME CARE INSTRUCTIONS You may shower 24 hours after the procedure. Remove the bandage (dressing) and gently wash the site with plain soap and water. Gently pat the site dry.  Do not apply powder or lotion to the site.  Do not sit in a bathtub, swimming pool, or whirlpool for 5 to 7 days.  No bending, squatting, or lifting anything over 10 pounds (4.5 kg) as directed by your caregiver.  Inspect the site at least twice daily.  Do not drive home if you are discharged the same day of the  procedure. Have someone else drive you.  You may drive 24 hours after the procedure unless otherwise instructed by your caregiver.  What to expect: Any bruising will usually fade within 1 to 2 weeks.  Blood that collects in the tissue (hematoma) may be painful to the touch. It should usually decrease in size and tenderness within 1 to 2 weeks.  SEEK IMMEDIATE MEDICAL CARE IF: You have unusual pain at the groin site or down the affected leg.  You have redness, warmth, swelling, or pain at the groin site.  You have drainage (other than a small amount of blood on the dressing).  You have chills.  You have a fever or persistent symptoms for more than 72 hours.  You have a fever and your symptoms suddenly get worse.  Your leg becomes pale, cool, tingly, or numb.  You have heavy bleeding from the site. Hold pressure on the site. Marland Kitchen  PLEASE DO NOT MISS ANY DOSES OF [employer]!!!!! Also keep a log of you blood pressures and bring back to your follow up appt. Please call the office with any questions.   Patients taking blood thinners should generally stay away from medicines like ibuprofen, Advil, Motrin, naproxen, and Aleve due to risk of stomach bleeding. You may take Tylenol as directed or talk to your primary doctor about alternatives.  Some studies suggest Prilosec/Omeprazole interacts with Plavix. We changed your Prilosec/Omeprazole to the equivalent dose of Protonix for less chance of interaction.   Increase activity slowly   Complete by:  As directed       Discharge Medications     Medication List    STOP taking these medications   omeprazole 20 MG capsule Commonly known as:  PRILOSEC     TAKE these medications   acetaminophen 500 MG tablet Commonly known as:  TYLENOL Take 500 mg by mouth daily as needed for moderate pain or headache.   albuterol 108 (90 Base) MCG/ACT inhaler Commonly known as:  PROVENTIL HFA;VENTOLIN HFA Inhale 2 puffs into the lungs every 6 (six) hours as  needed for wheezing or shortness of breath.   amLODipine 5 MG tablet Commonly known as:  NORVASC Take 1 tablet (5 mg total) by mouth daily.   ASPERCREME EX Apply 1 application topically daily as needed (joint pain).   aspirin 81 MG tablet Take 81 mg by mouth daily.   atorvastatin 10 MG tablet Commonly known as:  LIPITOR Take 10 mg by mouth daily.   CALCIUM 600+D PO Take 1 tablet by mouth daily.   cetirizine 10 MG tablet Commonly known as:  ZYRTEC Take 10 mg by mouth daily.   cholecalciferol 1000 units tablet Commonly known as:  VITAMIN D Take 2,000 Units by mouth daily.   clopidogrel 75 MG tablet Commonly known as:  PLAVIX Take 1 tablet (75 mg total) by mouth daily with breakfast.   gabapentin 300 MG capsule Commonly known  as:  NEURONTIN Take 300 mg by mouth daily.   glimepiride 1 MG tablet Commonly known as:  AMARYL Take 1 mg by mouth 2 (two) times daily.   levothyroxine 50 MCG tablet Commonly known as:  SYNTHROID, LEVOTHROID Take 50 mcg by mouth daily before breakfast.   metFORMIN 500 MG 24 hr tablet Commonly known as:  GLUCOPHAGE-XR Take 1,000 mg by mouth 2 (two) times daily.   multivitamin capsule Take 1 capsule by mouth daily.   OSTEO BI-FLEX ADV TRIPLE ST Tabs Take 1 tablet by mouth 2 (two) times daily.   ranitidine 75 MG tablet Commonly known as:  ZANTAC Take 75 mg by mouth daily.   tetrahydrozoline-zinc 0.05-0.25 % ophthalmic solution Commonly known as:  VISINE-AC Place 1 drop into both eyes 2 (two) times daily as needed (dry eyes).   triamterene-hydrochlorothiazide 75-50 MG tablet Commonly known as:  MAXZIDE Take 1 tablet by mouth daily.   vitamin B-12 500 MCG tablet Commonly known as:  CYANOCOBALAMIN Take 500 mcg by mouth daily.        Outstanding Labs/Studies   Follow up dopplers.   Duration of Discharge Encounter   Greater than 30 minutes including physician time.  Signed, Reino Bellis NP-C 05/09/2017, 10:09 AM   I  have personally seen and examined this patient. I agree with the assessment and plan as outlined above. She is doing well this am post intervention of the right SFA and right iliac. Will continue ASA and Plavix. Discharge home today. Follow up as planned with Dr. Gwenlyn Found with non-invasive testing.   Lauree Chandler 05/09/2017 10:09 AM

## 2017-05-16 ENCOUNTER — Ambulatory Visit (HOSPITAL_COMMUNITY)
Admission: RE | Admit: 2017-05-16 | Payer: Medicare Other | Source: Ambulatory Visit | Attending: Cardiovascular Disease | Admitting: Cardiovascular Disease

## 2017-05-16 ENCOUNTER — Encounter (HOSPITAL_COMMUNITY): Payer: Medicare Other

## 2017-05-23 ENCOUNTER — Ambulatory Visit: Payer: Medicare Other | Admitting: Cardiovascular Disease

## 2017-05-24 ENCOUNTER — Ambulatory Visit (HOSPITAL_COMMUNITY)
Admission: RE | Admit: 2017-05-24 | Discharge: 2017-05-24 | Disposition: A | Discharge status: Home / Self Care | Payer: Medicare Other | Source: Ambulatory Visit | Attending: Internal Medicine | Admitting: Internal Medicine

## 2017-05-24 DIAGNOSIS — Z87891 Personal history of nicotine dependence: Secondary | ICD-10-CM | POA: Insufficient documentation

## 2017-05-24 DIAGNOSIS — I70201 Unspecified atherosclerosis of native arteries of extremities, right leg: Secondary | ICD-10-CM | POA: Diagnosis not present

## 2017-05-24 DIAGNOSIS — E1151 Type 2 diabetes mellitus with diabetic peripheral angiopathy without gangrene: Secondary | ICD-10-CM | POA: Insufficient documentation

## 2017-05-24 DIAGNOSIS — E669 Obesity, unspecified: Secondary | ICD-10-CM | POA: Diagnosis not present

## 2017-05-24 DIAGNOSIS — I739 Peripheral vascular disease, unspecified: Secondary | ICD-10-CM | POA: Diagnosis not present

## 2017-05-24 DIAGNOSIS — I10 Essential (primary) hypertension: Secondary | ICD-10-CM | POA: Diagnosis not present

## 2017-05-24 DIAGNOSIS — Z794 Long term (current) use of insulin: Secondary | ICD-10-CM | POA: Insufficient documentation

## 2017-05-24 DIAGNOSIS — R9389 Abnormal findings on diagnostic imaging of other specified body structures: Secondary | ICD-10-CM | POA: Insufficient documentation

## 2017-05-24 DIAGNOSIS — Z9889 Other specified postprocedural states: Secondary | ICD-10-CM | POA: Diagnosis not present

## 2017-06-09 ENCOUNTER — Encounter: Payer: Self-pay | Admitting: Cardiovascular Disease

## 2017-06-09 ENCOUNTER — Ambulatory Visit (INDEPENDENT_AMBULATORY_CARE_PROVIDER_SITE_OTHER): Payer: Medicare Other | Admitting: Cardiovascular Disease

## 2017-06-09 VITALS — BP 138/59 | HR 75 | Ht 66.5 in | Wt 164.8 lb

## 2017-06-09 DIAGNOSIS — I10 Essential (primary) hypertension: Secondary | ICD-10-CM

## 2017-06-09 DIAGNOSIS — I739 Peripheral vascular disease, unspecified: Secondary | ICD-10-CM

## 2017-06-09 NOTE — Assessment & Plan Note (Signed)
History of peripheral arterial disease intervention by myself as ago for critical limb ischemia using diamondback orbital rotational atherectomy and stenting.  Her ABI did increase from 0.77 up to 0.93 post intervention.  Because of recurrent hip and buttock pain with worsening Doppler studies I re-angiogram to her 05/08/2017 and stented her right external iliac artery.  Her right SFA stent was subtotally occluded and I re-re-dilated this with drug-eluting balloons.  Her follow-up Dopplers improved as did her symptoms.  We will continue to follow these on a semiannual basis.

## 2017-06-09 NOTE — Assessment & Plan Note (Signed)
History of essential hypertension her blood pressure measured at 138/59.  She is on amlodipine and Maxide .  Continue current meds at current dosing.Marland Kitchen

## 2017-06-09 NOTE — Progress Notes (Signed)
06/09/2017 Nicole Sellers   26-Sep-1950  [id number]  Primary Physician Minette Brine Primary Cardiologist: Lorretta Harp MD Lupe Carney, [place]  HPI:  Nicole Sellers is a 67 y.o.  mildly overweight towards [place] American female mother of one child and works as a [employer]ar[employer] manager. She was referred by Dr.Ajlouny from [facility] podiatry for evaluation of critical limb ischemia.I last saw her in the office  04/14/2017.Her risk factors include a 25-50-pack-year history of tobacco abusehaving quit in 2016.She has history of non-insulin-requiring diabetes untreated hypertension. She's never had a heart attack or stroke. She complains of dyspnea but denies chest pain. She's had claudication of less than 3 years which is lifestyle limiting and a gangrenous right second toe of the last 2-3 weeks. Dopplers her office suggested high-grade right SFA disease with an occluded anterior tibial as was the occluded left SFA.she underwent angiography several weeks ago revealing high-grade segmentally occluded and calcified proximal and mid right SFA stenosis with one vessel runoff via the peroneal. She underwent staged diamondback with rotational atherectomy, PTA and stenting of a long segment calcified subtotally occluded right SFA for critical limb ischemia (gangrenous right second toe). She will vessel runoff via the posterior tibial. Her Dopplers revealed increase in her ABIs from 0.77 To .93 post intervention. Resting pain has resolved. Her gangrenous toewas amputated by Dr. Fritzi Mandes. Since I saw her 3 years ago she started well until recently when she developed some right hip and buttock pain remained down the lateral aspect of her leg which sounds more like sciatica. Recent lower actually arterial Doppler studies reveal a decline in her right ABI from near normal down to 0.62 the high-frequency signal in her right iliac as well as mid right SFA within her stented segment. I performed peripheral angiography on  her 05/08/2017 revealing a 60% right external iliac artery stenosis which I stented as well as a subtotally occluded occluded long stented segment in the right SFA which I intervened on with drug-eluting balloons.  Her follow-up Dopplers improved and her symptoms did as well.  He did in addition have an occluded left SFA although she is asymptomatic on the side.      Current Meds  Medication Sig  . acetaminophen (TYLENOL) 500 MG tablet Take 500 mg by mouth daily as needed for moderate pain or headache.  . albuterol (PROVENTIL HFA;VENTOLIN HFA) 108 (90 BASE) MCG/ACT inhaler Inhale 2 puffs into the lungs every 6 (six) hours as needed for wheezing or shortness of breath.  Marland Kitchen amLODipine (NORVASC) 5 MG tablet Take 1 tablet (5 mg total) by mouth daily.  Marland Kitchen aspirin 81 MG tablet Take 81 mg by mouth daily.  Marland Kitchen atorvastatin (LIPITOR) 10 MG tablet Take 10 mg by mouth daily.  . Calcium Carbonate-Vitamin D (CALCIUM 600+D PO) Take 1 tablet by mouth daily.  . cetirizine (ZYRTEC) 10 MG tablet Take 10 mg by mouth daily.  . cholecalciferol (VITAMIN D) 1000 units tablet Take 2,000 Units by mouth daily.  . clopidogrel (PLAVIX) 75 MG tablet Take 1 tablet (75 mg total) by mouth daily with breakfast.  . gabapentin (NEURONTIN) 300 MG capsule Take 300 mg by mouth daily.   Marland Kitchen glimepiride (AMARYL) 1 MG tablet Take 1 mg by mouth 2 (two) times daily.  Marland Kitchen levothyroxine (SYNTHROID, LEVOTHROID) 50 MCG tablet Take 50 mcg by mouth daily before breakfast.  . metFORMIN (GLUCOPHAGE-XR) 500 MG 24 hr tablet Take 1,000 mg by mouth 2 (two) times daily.  . Misc Natural  Products (OSTEO BI-FLEX ADV TRIPLE ST) TABS Take 1 tablet by mouth 2 (two) times daily.  . Multiple Vitamin (MULTIVITAMIN) capsule Take 1 capsule by mouth daily.  . ranitidine (ZANTAC) 75 MG tablet Take 75 mg by mouth daily.  Marland Kitchen tetrahydrozoline-zinc (VISINE-AC) 0.05-0.25 % ophthalmic solution Place 1 drop into both eyes 2 (two) times daily as needed (dry eyes).  .  triamterene-hydrochlorothiazide (MAXZIDE) 75-50 MG tablet Take 1 tablet by mouth daily.  Loura Pardon Salicylate (ASPERCREME EX) Apply 1 application topically daily as needed (joint pain).  . vitamin B-12 (CYANOCOBALAMIN) 500 MCG tablet Take 500 mcg by mouth daily.     Allergies  Allergen Reactions  . Kiwi Extract Itching    Throat itching   . Lisinopril     Bruising   . Plavix [Clopidogrel Bisulfate]     Unknown reaction     Social History   Socioeconomic History  . Marital status: Single    Spouse name: Not on file  . Number of children: Not on file  . Years of education: Not on file  . Highest education level: Not on file  Occupational History  . Not on file  Social Needs  . Financial resource strain: Not on file  . Food insecurity:    Worry: Not on file    Inability: Not on file  . Transportation needs:    Medical: Not on file    Non-medical: Not on file  Tobacco Use  . Smoking status: Former Smoker    Packs/day: 0.50    Years: 48.00    Pack years: 24.00    Types: Cigarettes  . Smokeless tobacco: Never Used  . Tobacco comment: quit in 2016  Substance and Sexual Activity  . Alcohol use: No    Alcohol/week: 0.0 oz    Comment: 12/25/2012 "drink a beer maybe once/month"  . Drug use: No  . Sexual activity: Not Currently  Lifestyle  . Physical activity:    Days per week: Not on file    Minutes per session: Not on file  . Stress: Not on file  Relationships  . Social connections:    Talks on phone: Not on file    Gets together: Not on file    Attends religious service: Not on file    Active member of club or organization: Not on file    Attends meetings of clubs or organizations: Not on file    Relationship status: Not on file  . Intimate partner violence:    Fear of current or ex partner: Not on file    Emotionally abused: Not on file    Physically abused: Not on file    Forced sexual activity: Not on file  Other Topics Concern  . Not on file  Social  History Narrative  . Not on file     Review of Systems: General: negative for chills, fever, night sweats or weight changes.  Cardiovascular: negative for chest pain, dyspnea on exertion, edema, orthopnea, palpitations, paroxysmal nocturnal dyspnea or shortness of breath Dermatological: negative for rash Respiratory: negative for cough or wheezing Urologic: negative for hematuria Abdominal: negative for nausea, vomiting, diarrhea, bright red blood per rectum, melena, or hematemesis Neurologic: negative for visual changes, syncope, or dizziness All other systems reviewed and are otherwise negative except as noted above.    Blood pressure (!) 138/59, pulse 75, height 5' 6.5" (1.689 m), weight 164 lb 12.8 oz (74.8 kg).  General appearance: alert and no distress Neck: no adenopathy, no carotid bruit,  no JVD, supple, symmetrical, trachea midline and thyroid not enlarged, symmetric, no tenderness/mass/nodules Lungs: clear to auscultation bilaterally Heart: regular rate and rhythm, S1, S2 normal, no murmur, click, rub or gallop Extremities: extremities normal, atraumatic, no cyanosis or edema Pulses: 2+ and symmetric Skin: Skin color, texture, turgor normal. No rashes or lesions Neurologic: Alert and oriented X 3, normal strength and tone. Normal symmetric reflexes. Normal coordination and gait  EKG not performed today  ASSESSMENT AND PLAN:   PAD (peripheral artery disease) Rt ABI 0.7, Lt ABI 0.46 History of peripheral arterial disease intervention by myself as ago for critical limb ischemia using diamondback orbital rotational atherectomy and stenting.  Her ABI did increase from 0.77 up to 0.93 post intervention.  Because of recurrent hip and buttock pain with worsening Doppler studies I re-angiogram to her 05/08/2017 and stented her right external iliac artery.  Her right SFA stent was subtotally occluded and I re-re-dilated this with drug-eluting balloons.  Her follow-up Dopplers improved  as did her symptoms.  We will continue to follow these on a semiannual basis.  Essential hypertension History of essential hypertension her blood pressure measured at 138/59.  She is on amlodipine and Maxide .  Continue current meds at current dosing.Lorretta Harp MD FACP,FACC,FAHA, [facility] 06/09/2017 11:52 AM

## 2017-06-09 NOTE — Patient Instructions (Signed)
Medication Instructions: Your physician recommends that you continue on your current medications as directed. Please refer to the Current Medication list given to you today.   Testing/Procedures:  Every 6 months: Your physician has requested that you have a lower extremity arterial duplex. During this test, ultrasound is used to evaluate arterial blood flow in the legs. Allow one hour for this exam. There are no restrictions or special instructions.  Your physician has requested that you have an ankle brachial index (ABI). During this test an ultrasound and blood pressure cuff are used to evaluate the arteries that supply the arms and legs with blood. Allow thirty minutes for this exam. There are no restrictions or special instructions.  Follow-Up: We request that you follow-up in: 2 months with an extender and in 12 months with Dr Andria Rhein will receive a reminder letter in the mail two months in advance. If you don't receive a letter, please call our office to schedule the follow-up appointment.  If you need a refill on your cardiac medications before your next appointment, please call your pharmacy.

## 2017-07-11 ENCOUNTER — Other Ambulatory Visit: Payer: Self-pay | Admitting: Cardiology

## 2017-08-01 LAB — COLOGUARD

## 2017-08-10 ENCOUNTER — Ambulatory Visit (INDEPENDENT_AMBULATORY_CARE_PROVIDER_SITE_OTHER): Payer: Medicare Other | Admitting: Physician Assistant

## 2017-08-10 ENCOUNTER — Encounter: Payer: Self-pay | Admitting: Physician Assistant

## 2017-08-10 VITALS — BP 138/62 | HR 72 | Ht 66.5 in | Wt 167.8 lb

## 2017-08-10 DIAGNOSIS — I10 Essential (primary) hypertension: Secondary | ICD-10-CM

## 2017-08-10 DIAGNOSIS — K922 Gastrointestinal hemorrhage, unspecified: Secondary | ICD-10-CM

## 2017-08-10 DIAGNOSIS — I739 Peripheral vascular disease, unspecified: Secondary | ICD-10-CM

## 2017-08-10 NOTE — Patient Instructions (Addendum)
Medication Instructions:  Your physician recommends that you continue on your current medications as directed. Please refer to the Current Medication list given to you today.   Labwork: TODAY: CBC, CMET, LIPIDS, TSH  Testing/Procedures: Your physician has requested that you have an ankle brachial index (ABI) in November. During this test an ultrasound and blood pressure cuff are used to evaluate the arteries that supply the arms and legs with blood. Allow thirty minutes for this exam. There are no restrictions or special instructions.  Follow-Up: Your physician recommends that you follow-up with your Primary Care Doctor  Your physician recommends that you follow-up with Dr. Collene Mares for your GI symptoms  Your physician wants you to follow-up in: May with Dr. Gwenlyn Found. You will receive a reminder letter in the mail two months in advance. If you don't receive a letter, please call our office to schedule the follow-up appointment.     Any Other Special Instructions Will Be Listed Below (If Applicable).     If you need a refill on your cardiac medications before your next appointment, please call your pharmacy.

## 2017-08-10 NOTE — Progress Notes (Signed)
Cardiology Office Note   Date:  08/10/2017   ID:  ROSCHELLE CALANDRA, DOB May 15, 1950, MRN [id number]  PCP:  Minette Brine  Cardiologist:  Dr Gwenlyn Found, 06/09/2017  Nicole Ferries, PA-C    History of Present Illness: Nicole Sellers is a 67 y.o. female with a history of high tob use (quit 2016), DM, HTN, HLD, PAD s/p HSRA chocolate balloon I DEV stent to R-SFA 2014 2nd R great toe gangrene, R ABI 0.63 04/2017 s/p PV cath w/ R-SFA stent occluded s/p drug eluting balloon angioplasty and R-Ext iliac stent, L-SFA 100% but asymptomatic, hypothyroid  05/31 office visit, pt stable post PTA, no med changes  Nicole Sellers presents for cardiology follow up.  She has quit taking the Plavix, had blood in her stools. Says larger amounts, more that just a drop or 2. Has never been told she has hemorrhoids, never had a colonoscopy. Has done Cologuard, did that recently, but has not heard anything about the results. Her gut was generally upset and she had some abdominal pain as well. All sx resolved off the Plavix. Sees Dr Collene Mares for GI but has not seen her recently, for this.  Her legs are doing well. She is not walking much. Has been thinking about increasing it. Has Silver Sneakers so can get to a gym.   Feels tired, not much energy lately. Does not feel it is the heat.   She is having claudication symptoms. She gets a burning pain in the back of her L leg after she walks a block. Is not having pain in her R leg. No LE edema, no orthopnea or PND.   Past Medical History:  Diagnosis Date  . Allergy   . Arthritis    "joints sometimes" (12/25/2012)  . Clotting disorder ([place])   . Critical lower limb ischemia   . Gangrene of toe, Rt second toe 12/25/2012  . GERD (gastroesophageal reflux disease)   . High cholesterol   . Hypertension   . Hypothyroidism   . PAD (peripheral artery disease) ([place])   . S/P arterial stent, 12/25/13, successful diamondback orbital rotational arthrectomy, PTA using  chocolate  balloon and stenting using I DEV stent of long segment calcified high-grade proximal and mid r 12/25/2012  . Tobacco abuse   . Type II diabetes mellitus ([place])     Past Surgical History:  Procedure Laterality Date  . ABDOMINAL AORTAGRAM  12/20/2012   Procedure: ABDOMINAL AORTAGRAM;  Surgeon: Lorretta Harp, MD;  Location: [facility] CATH LAB;  Service: Cardiovascular;;  . ABDOMINAL HYSTERECTOMY  2000  . ANGIOPLASTY / STENTING FEMORAL Right 12/25/2012  . ATHERECTOMY Right 12/25/2012   Procedure: ATHERECTOMY;  Surgeon: Lorretta Harp, MD;  Location: [facility] CATH LAB;  Service: Cardiovascular;  Laterality: Right;  right SFA  . HERNIA REPAIR    . LOWER EXTREMITY ANGIOGRAM N/A 12/20/2012   Procedure: LOWER EXTREMITY ANGIOGRAM;  Surgeon: Lorretta Harp, MD;  Location: [facility] CATH LAB;  Service: Cardiovascular;  Laterality: N/A;  . LOWER EXTREMITY ANGIOGRAM N/A 12/25/2012   Procedure: LOWER EXTREMITY ANGIOGRAM;  Surgeon: Lorretta Harp, MD;  Location: [facility] CATH LAB;  Service: Cardiovascular;  Laterality: N/A;  . LOWER EXTREMITY INTERVENTION  05/08/2017  . LOWER EXTREMITY INTERVENTION Bilateral 05/08/2017   Procedure: LOWER EXTREMITY INTERVENTION;  Surgeon: Lorretta Harp, MD;  Location: [place] CV LAB;  Service: Cardiovascular;  Laterality: Bilateral;  . PERIPHERAL VASCULAR BALLOON ANGIOPLASTY Right 05/08/2017   Procedure: PERIPHERAL VASCULAR BALLOON ANGIOPLASTY;  Surgeon: Gwenlyn Found,  Pearletha Forge, MD;  Location: [place] CV LAB;  Service: Cardiovascular;  Laterality: Right;  sfa  . PERIPHERAL VASCULAR INTERVENTION Right 05/08/2017   Procedure: PERIPHERAL VASCULAR INTERVENTION;  Surgeon: Lorretta Harp, MD;  Location: [place] CV LAB;  Service: Cardiovascular;  Laterality: Right;  ext iliac  . TOE SURGERY Right    2n toe   . UMBILICAL HERNIA REPAIR  2000    Current Outpatient Medications  Medication Sig Dispense Refill  . acetaminophen (TYLENOL) 500 MG tablet Take 500 mg by mouth daily  as needed for moderate pain or headache.    . albuterol (PROVENTIL HFA;VENTOLIN HFA) 108 (90 BASE) MCG/ACT inhaler Inhale 2 puffs into the lungs every 6 (six) hours as needed for wheezing or shortness of breath.    Marland Kitchen amLODipine (NORVASC) 5 MG tablet Take 1 tablet (5 mg total) by mouth daily. 90 tablet 2  . aspirin 81 MG tablet Take 81 mg by mouth daily.    Marland Kitchen atorvastatin (LIPITOR) 10 MG tablet Take 10 mg by mouth daily.    . Calcium Carbonate-Vitamin D (CALCIUM 600+D PO) Take 1 tablet by mouth daily.    . cetirizine (ZYRTEC) 10 MG tablet Take 10 mg by mouth daily.    . cholecalciferol (VITAMIN D) 1000 units tablet Take 2,000 Units by mouth daily.    Marland Kitchen gabapentin (NEURONTIN) 300 MG capsule Take 300 mg by mouth daily.     Marland Kitchen glimepiride (AMARYL) 1 MG tablet Take 1 mg by mouth 2 (two) times daily.    Marland Kitchen levothyroxine (SYNTHROID, LEVOTHROID) 50 MCG tablet Take 50 mcg by mouth daily before breakfast.    . metFORMIN (GLUCOPHAGE-XR) 500 MG 24 hr tablet Take 1,000 mg by mouth 2 (two) times daily.    . Misc Natural Products (OSTEO BI-FLEX ADV TRIPLE ST) TABS Take 1 tablet by mouth 2 (two) times daily.    . Multiple Vitamin (MULTIVITAMIN) capsule Take 1 capsule by mouth daily.    . ranitidine (ZANTAC) 75 MG tablet Take 75 mg by mouth daily.    Marland Kitchen tetrahydrozoline-zinc (VISINE-AC) 0.05-0.25 % ophthalmic solution Place 1 drop into both eyes 2 (two) times daily as needed (dry eyes).    . triamterene-hydrochlorothiazide (MAXZIDE) 75-50 MG tablet Take 1 tablet by mouth daily.    Loura Pardon Salicylate (ASPERCREME EX) Apply 1 application topically daily as needed (joint pain).    . vitamin B-12 (CYANOCOBALAMIN) 500 MCG tablet Take 500 mcg by mouth daily.    . clopidogrel (PLAVIX) 75 MG tablet Take 1 tablet (75 mg total) by mouth daily with breakfast. (Patient not taking: Reported on 08/10/2017) 30 tablet 1   No current facility-administered medications for this visit.     Allergies:   Kiwi extract; Lisinopril;  and Plavix [clopidogrel bisulfate]    Social History:  The patient  reports that she has quit smoking. Her smoking use included cigarettes. She has a 24.00 pack-year smoking history. She has never used smokeless tobacco. She reports that she does not drink alcohol or use drugs.   Family History:  The patient's family history includes Cancer in her father; Diabetes in her brother and brother; Hypertension in her brother, brother, and mother; Mental illness in her sister; Stroke in her mother.    ROS:  Please see the history of present illness. All other systems are reviewed and negative.    PHYSICAL EXAM: VS:  BP 138/62   Pulse 72   Ht 5' 6.5" (1.689 m)   Wt 167 lb 12.8  oz (76.1 kg)   BMI 26.68 kg/m  , BMI Body mass index is 26.68 kg/m. GEN: Well nourished, well developed, female in no acute distress  HEENT: normal for age  Neck: no JVD, bilateral carotid bruits, no masses Cardiac: RRR; 2/6 murmur, no rubs, or gallops Respiratory:  clear to auscultation bilaterally, normal work of breathing GI: soft, nontender, nondistended, + BS MS: no deformity or atrophy; no edema; distal pulses are 2+ in upper extremities, decreased in lower extremities but palpable Skin: warm and dry, no rash Neuro:  Strength and sensation are intact Psych: euthymic mood, full affect   EKG:  EKG is not ordered today.   Recent Labs: 05/09/2017: BUN 22; Creatinine, Ser 0.86; Hemoglobin 9.9; Platelets 388; Potassium 4.1; Sodium 137    Lipid Panel No results found for: CHOL, TRIG, HDL, CHOLHDL, VLDL, LDLCALC, LDLDIRECT   Wt Readings from Last 3 Encounters:  08/10/17 167 lb 12.8 oz (76.1 kg)  06/09/17 164 lb 12.8 oz (74.8 kg)  05/09/17 165 lb 5.5 oz (75 kg)     Other studies Reviewed: Additional studies/ records that were reviewed today include: Office notes, hospital records and testing.  ASSESSMENT AND PLAN:  1.  PAD: She is having some claudication symptoms in her left leg.  I explained that she  has a no blockage in that leg.  She is encouraged to walk as much she can and understands that when she gets claudication symptoms, she should stop, let them resolve, and then start walking again. - She is currently off the Plavix because of lower GI bleeding, Dr. Gwenlyn Found to address if he wants to try to restart it.  2.  GI bleeding: She describes bright red blood per rectum and amounts possibly consistent with hematochezia.  She has never had a colonoscopy.  Cologuard is pending.  She states her symptoms resolved off the Plavix.  She has seen Dr. Collene Mares in the past, she is requested to call her. -Check a CBC to make sure that her blood counts are not too low.  3.  Fatigue: This could be related to anemia, electrolyte imbalances, or thyroid issues.  She has an appointment with her primary care, but it is a new physician she is not seen before.  We will go ahead and check a TSH.  4.  Unknown lipid status: We have no lipid profile in our records and she has not had one done recently.  Check today.  She is not currently on a statin.  Current medicines are reviewed at length with the patient today.  The patient has concerns regarding medicines.  The following changes have been made:  no change  Labs/ tests ordered today include:   Orders Placed This Encounter  Procedures  . Comprehensive metabolic panel  . CBC  . Lipid panel  . TSH     Disposition:   FU with Dr Gwenlyn Found in May.  Jonetta Speak, PA-C  08/10/2017 4:29 PM    [place] Phone: [phone]; Fax: [phone]  This note was written with the assistance of speech recognition software. Please excuse any transcriptional errors.

## 2017-08-11 LAB — CBC
Hematocrit: 26.6 % — ABNORMAL LOW (ref 34.0–46.6)
Hemoglobin: 8.7 g/dL — ABNORMAL LOW (ref 11.1–15.9)
MCH: 25.4 pg — ABNORMAL LOW (ref 26.6–33.0)
MCHC: 32.7 g/dL (ref 31.5–35.7)
MCV: 78 fL — ABNORMAL LOW (ref 79–97)
Platelets: 537 10*3/uL — ABNORMAL HIGH (ref 150–450)
RBC: 3.43 x10E6/uL — ABNORMAL LOW (ref 3.77–5.28)
RDW: 15.2 % (ref 12.3–15.4)
WBC: 5.6 10*3/uL (ref 3.4–10.8)

## 2017-08-11 LAB — COMPREHENSIVE METABOLIC PANEL
ALT: 12 IU/L (ref 0–32)
AST: 16 IU/L (ref 0–40)
Albumin/Globulin Ratio: 1.4 (ref 1.2–2.2)
Albumin: 4.2 g/dL (ref 3.6–4.8)
Alkaline Phosphatase: 97 IU/L (ref 39–117)
BUN/Creatinine Ratio: 13 (ref 12–28)
BUN: 13 mg/dL (ref 8–27)
Bilirubin Total: <0.2 mg/dL (ref 0.0–1.2)
CO2: 25 mmol/L (ref 20–29)
Calcium: 10.7 mg/dL — ABNORMAL HIGH (ref 8.7–10.3)
Chloride: 95 mmol/L — ABNORMAL LOW (ref 96–106)
Creatinine, Ser: 1.03 mg/dL — ABNORMAL HIGH (ref 0.57–1.00)
GFR calc Af Amer: 64 mL/min/{1.73_m2} (ref >59)
GFR calc non Af Amer: 56 mL/min/{1.73_m2} — ABNORMAL LOW (ref >59)
Globulin, Total: 2.9 g/dL (ref 1.5–4.5)
Glucose: 157 mg/dL — ABNORMAL HIGH (ref 65–99)
Potassium: 5.3 mmol/L — ABNORMAL HIGH (ref 3.5–5.2)
Sodium: 136 mmol/L (ref 134–144)
Total Protein: 7.1 g/dL (ref 6.0–8.5)

## 2017-08-11 LAB — LIPID PANEL
Chol/HDL Ratio: 2.8 ratio (ref 0.0–4.4)
Cholesterol, Total: 164 mg/dL (ref 100–199)
HDL: 59 mg/dL (ref >39)
LDL Calculated: 80 mg/dL (ref 0–99)
Triglycerides: 123 mg/dL (ref 0–149)
VLDL Cholesterol Cal: 25 mg/dL (ref 5–40)

## 2017-08-11 LAB — TSH: TSH: 2.2 u[IU]/mL (ref 0.450–4.500)

## 2017-08-15 ENCOUNTER — Telehealth: Payer: Self-pay | Admitting: Cardiovascular Disease

## 2017-08-15 NOTE — Telephone Encounter (Signed)
Follow Up: ° ° ° °Returning your call, concerning her lab results. °

## 2017-08-16 ENCOUNTER — Other Ambulatory Visit: Payer: Self-pay | Admitting: Cardiovascular Disease

## 2017-08-16 DIAGNOSIS — I739 Peripheral vascular disease, unspecified: Secondary | ICD-10-CM

## 2017-08-16 LAB — HGB A1C W/O EAG: Hgb A1c MFr Bld: 6.6 % — ABNORMAL HIGH (ref 4.8–5.6)

## 2017-08-16 LAB — SPECIMEN STATUS REPORT

## 2017-08-16 NOTE — Telephone Encounter (Signed)
Left message for patient to contact office to discuss labs  

## 2017-08-21 NOTE — Telephone Encounter (Signed)
Patient notified directly.

## 2017-08-29 ENCOUNTER — Telehealth: Payer: Self-pay | Admitting: *Deleted

## 2017-08-29 NOTE — Telephone Encounter (Signed)
    [facility] Pre-operative Risk Assessment    Request for surgical clearance:  1. What type of surgery is being performed? COLONOSCOPY   2. When is this surgery scheduled? TBD   3. What type of clearance is required (medical clearance vs. Pharmacy clearance to hold med vs. Both)? BOTH  4. Are there any medications that need to be held prior to surgery and how long?PLAVIX   5. Practice name and name of physician performing surgery? Juanita Craver MD   6. What is your office phone number [phone]   7.   What is your office fax number [phone]  8.   Anesthesia type (None, local, MAC, general) ? NOT LISTED   Nicole Sellers 08/29/2017, 7:49 AM  _________________________________________________________________   (provider comments below)

## 2017-08-29 NOTE — Telephone Encounter (Signed)
   Primary Cardiologist: Quay Burow, MD  Chart reviewed as part of pre-operative protocol coverage. Given past medical history and time since last visit, based on ACC/AHA guidelines, [place] M Mee would be at acceptable risk for the planned procedure without further cardiovascular testing.   Noted that the patient is currently off of Plavix and is not taking - this was verified by phone conversation today.   I will route this recommendation to the requesting party via Epic fax function and remove from pre-op pool.  Please call with questions.  Truitt Merle, NP 08/29/2017, 3:38 PM

## 2017-10-09 ENCOUNTER — Other Ambulatory Visit: Payer: Self-pay | Admitting: Gastroenterology

## 2017-10-09 DIAGNOSIS — R933 Abnormal findings on diagnostic imaging of other parts of digestive tract: Secondary | ICD-10-CM

## 2017-10-13 ENCOUNTER — Other Ambulatory Visit: Payer: Self-pay | Admitting: Cardiology

## 2017-10-13 NOTE — Telephone Encounter (Signed)
This is Dr. Berry's pt 

## 2017-10-20 ENCOUNTER — Other Ambulatory Visit: Payer: Self-pay | Admitting: Cardiovascular Disease

## 2017-10-20 DIAGNOSIS — I739 Peripheral vascular disease, unspecified: Secondary | ICD-10-CM

## 2017-10-20 DIAGNOSIS — Z95820 Peripheral vascular angioplasty status with implants and grafts: Secondary | ICD-10-CM

## 2017-10-25 ENCOUNTER — Ambulatory Visit (HOSPITAL_COMMUNITY)
Admission: RE | Admit: 2017-10-25 | Discharge: 2017-10-25 | Disposition: A | Discharge status: Home / Self Care | Payer: Medicare Other | Source: Ambulatory Visit | Attending: Gastroenterology | Admitting: Gastroenterology

## 2017-10-25 DIAGNOSIS — R933 Abnormal findings on diagnostic imaging of other parts of digestive tract: Secondary | ICD-10-CM | POA: Insufficient documentation

## 2017-10-25 DIAGNOSIS — R918 Other nonspecific abnormal finding of lung field: Secondary | ICD-10-CM | POA: Insufficient documentation

## 2017-10-25 LAB — POCT I-STAT CREATININE: Creatinine, Ser: 0.9 mg/dL (ref 0.44–1.00)

## 2017-10-25 MED ORDER — IOHEXOL 300 MG/ML  SOLN
100.0000 mL | Freq: Once | INTRAMUSCULAR | Status: AC | PRN
  Administered 2017-10-25: 100 mL via INTRAVENOUS

## 2017-10-25 MED ORDER — SODIUM CHLORIDE 0.9 % IJ SOLN
INTRAMUSCULAR | Status: AC
  Filled 2017-10-25: qty 50

## 2017-10-31 ENCOUNTER — Encounter: Payer: Self-pay | Admitting: Nurse Practitioner

## 2017-10-31 ENCOUNTER — Telehealth: Payer: Self-pay | Admitting: Nurse Practitioner

## 2017-10-31 NOTE — Telephone Encounter (Signed)
New referral received from Dr. Collene Mares for metastatic colon cancer. Pt has been cld and scheduled to see Wilnette Kales on 10/28 at 130pm. Pt aware to arrive 30 minutes early. Letter mailed.

## 2017-11-05 NOTE — Progress Notes (Addendum)
[place]  Telephone:([phone]) [phone] Fax:([phone]) [place] Note   Patient Care Team: Reynold Bowen, MD as PCP - General (Endocrinology) Lorretta Harp, MD as PCP - Cardiology (Cardiology) 11/06/2017  CHIEF COMPLAINTS/PURPOSE OF CONSULTATION:  Colon cancer; consult requested by Dr. Collene Mares     Adenocarcinoma of colon [facility])   10/04/2017 Procedure    Colonoscopy per Dr. Collene Mares 10/04/17 shows a large, non-obstructing horse-shoe shaped mass was found in the recto-sigmoid colon at 10 cm; the mass was partially circumferential involving one-half of the lumen circumference. The mass measured seven cm in length    10/04/2017 Initial Biopsy    Rectum, at 10-17 cm, mass biopsy -invasive adenocarcinoma, moderately differentiated  ICH stains for MLH1, MSH2, MSH6, and PMS2 are intact (normal)    10/25/2017 Imaging    CT CAP IMPRESSION: 1. Irregular eccentric mass within the sigmoid colon. There are at least 3 abnormal appearing lymph node adjacent to the sigmoid colon at the level of the mass concerning for the possibility of local nodal metastatic disease. 2. Multiple pulmonary nodules as described above. These are indeterminate in etiology. Recommend attention on follow-up.    11/06/2017 Initial Diagnosis    Adenocarcinoma of colon (HCC)     HISTORY OF PRESENTING ILLNESS:  Nicole Sellers 67 y.o. female is here because of recto-sigmoid mass. She presented to GI Dr. Collene Mares on 07/18/17 reporting 2 year h/o diarrhea, abdominal pain, gas, and bloating. Cologuard testing on 08/01/17 was positive. She reported large amount of rectal bleeding and fatigue to her cardiologist in August. She underwent screening colonoscopy on 10/04/17 which showed a large 7-8 cm horse-shoe shaped tumor in the recto-sigmoid colon. Biopsy of the rectum at 10-17 cm confirmed invasive adenocarcinoma, moderately differentiated, MMR normal by IHC. Staging CT CAP on 10/26/17 showed an irregular  eccentric mass within the sigmoid colon and at least 3 abnormal lymph nodes at the level of the mass concerning for nodal metastasis, as well as multiple small indeterminate pulmonary lung nodules.   PMH is significant for HTN, HL, DM, neuropathy, depression, CAD, s/p vascular stent x2 right leg 2014, 2019, hypothyroidism, arthritis, and umbilical hernia s/p repair. She is followed by Dr. Gwenlyn Found, cardiology. She recently stopped taking plavix with rectal bleeding. She never had previous colonoscopy. Mammogram is up to date. She is a former tobacco smoker of 50 years. Reports history of moderate alcohol use, and denies illicit drug use. Family history is positive for prostate cancer in father and brother. A second brother had "tumors on his neck and body" that requires chemotherapy. Denies colon, breast, or GYN cancer in the family. Socially, she lives alone. She is independent of ADLs but does not drive due to poor vision. Ambulates with a cane. Son lives in town with his family.   Today, she presents by herself. She has increased fatigue and decreased appetite lately, but no weight loss. She has diarrhea anywhere from 0-5 episodes per day, takes imodium occasionally but prefers to "let it run its course." She denies recent blood in stool. Has left side abdominal "soreness" but does not require pain medication. Has right back/buttock pain that radiates down her right leg, uses a cane. Neuropathy in fer feet is controlled with gabapentin. She has longstanding depression and occasionally feels hopeless, denies SI/HI. Has had a dry cough for many years, no recent fever, chills, chest pain, or dyspnea.   MEDICAL HISTORY:  Past Medical History:  Diagnosis Date  . Allergy   .  Arthritis    "joints sometimes" (12/25/2012)  . Clotting disorder ([place])   . Colon cancer ([place])   . Critical lower limb ischemia   . Gangrene of toe, Rt second toe 12/25/2012  . GERD (gastroesophageal reflux disease)   . High  cholesterol   . Hypertension   . Hypothyroidism   . PAD (peripheral artery disease) ([place])   . S/P arterial stent, 12/25/13, successful diamondback orbital rotational arthrectomy, PTA using chocolate  balloon and stenting using I DEV stent of long segment calcified high-grade proximal and mid r 12/25/2012  . Tobacco abuse   . Type II diabetes mellitus ([place])     SURGICAL HISTORY: Past Surgical History:  Procedure Laterality Date  . ABDOMINAL AORTAGRAM  12/20/2012   Procedure: ABDOMINAL AORTAGRAM;  Surgeon: Lorretta Harp, MD;  Location: [facility] CATH LAB;  Service: Cardiovascular;;  . ABDOMINAL HYSTERECTOMY  2000  . ANGIOPLASTY / STENTING FEMORAL Right 12/25/2012  . ATHERECTOMY Right 12/25/2012   Procedure: ATHERECTOMY;  Surgeon: Lorretta Harp, MD;  Location: [facility] CATH LAB;  Service: Cardiovascular;  Laterality: Right;  right SFA  . HERNIA REPAIR    . LOWER EXTREMITY ANGIOGRAM N/A 12/20/2012   Procedure: LOWER EXTREMITY ANGIOGRAM;  Surgeon: Lorretta Harp, MD;  Location: [facility] CATH LAB;  Service: Cardiovascular;  Laterality: N/A;  . LOWER EXTREMITY ANGIOGRAM N/A 12/25/2012   Procedure: LOWER EXTREMITY ANGIOGRAM;  Surgeon: Lorretta Harp, MD;  Location: [facility] CATH LAB;  Service: Cardiovascular;  Laterality: N/A;  . LOWER EXTREMITY INTERVENTION  05/08/2017  . LOWER EXTREMITY INTERVENTION Bilateral 05/08/2017   Procedure: LOWER EXTREMITY INTERVENTION;  Surgeon: Lorretta Harp, MD;  Location: [place] CV LAB;  Service: Cardiovascular;  Laterality: Bilateral;  . PERIPHERAL VASCULAR BALLOON ANGIOPLASTY Right 05/08/2017   Procedure: PERIPHERAL VASCULAR BALLOON ANGIOPLASTY;  Surgeon: Lorretta Harp, MD;  Location: [place] CV LAB;  Service: Cardiovascular;  Laterality: Right;  sfa  . PERIPHERAL VASCULAR INTERVENTION Right 05/08/2017   Procedure: PERIPHERAL VASCULAR INTERVENTION;  Surgeon: Lorretta Harp, MD;  Location: [place] CV LAB;  Service: Cardiovascular;  Laterality: Right;  ext  iliac  . TOE SURGERY Right    2n toe   . UMBILICAL HERNIA REPAIR  2000    SOCIAL HISTORY: Social History   Socioeconomic History  . Marital status: Single    Spouse name: Not on file  . Number of children: 1  . Years of education: Not on file  . Highest education level: Not on file  Occupational History  . Occupation: CNA    Comment: Retired   Scientific laboratory technician  . Financial resource strain: Not on file  . Food insecurity:    Worry: Not on file    Inability: Not on file  . Transportation needs:    Medical: Not on file    Non-medical: Not on file  Tobacco Use  . Smoking status: Former Smoker    Packs/day: 0.50    Years: 48.00    Pack years: 24.00    Types: Cigarettes  . Smokeless tobacco: Never Used  . Tobacco comment: quit in 2016  Substance and Sexual Activity  . Alcohol use: No    Alcohol/week: 0.0 standard drinks    Comment: 12/25/2012 "drink a beer maybe once/month"  . Drug use: No  . Sexual activity: Not Currently  Lifestyle  . Physical activity:    Days per week: Not on file    Minutes per session: Not on file  . Stress: Not on file  Relationships  .  Social connections:    Talks on phone: Not on file    Gets together: Not on file    Attends religious service: Not on file    Active member of club or organization: Not on file    Attends meetings of clubs or organizations: Not on file    Relationship status: Not on file  . Intimate partner violence:    Fear of current or ex partner: Not on file    Emotionally abused: Not on file    Physically abused: Not on file    Forced sexual activity: Not on file  Other Topics Concern  . Not on file  Social History Narrative  . Not on file    FAMILY HISTORY: Family History  Problem Relation Age of Onset  . Hypertension Mother   . Stroke Mother   . Cancer Father        prostate  . Mental illness Sister   . Diabetes Brother   . Hypertension Brother   . Cancer Brother 58       prostate  . Diabetes Brother   .  Hypertension Brother   . Cancer Brother        "tumors on neck and body"     ALLERGIES:  is allergic to kiwi extract; lisinopril; and plavix [clopidogrel bisulfate].  MEDICATIONS:  Current Outpatient Medications  Medication Sig Dispense Refill  . acetaminophen (TYLENOL) 500 MG tablet Take 500 mg by mouth daily as needed for moderate pain or headache.    . albuterol (PROVENTIL HFA;VENTOLIN HFA) 108 (90 BASE) MCG/ACT inhaler Inhale 2 puffs into the lungs every 6 (six) hours as needed for wheezing or shortness of breath.    Marland Kitchen amLODipine (NORVASC) 5 MG tablet Take 1 tablet (5 mg total) by mouth daily. 90 tablet 2  . aspirin 81 MG tablet Take 81 mg by mouth daily.    Marland Kitchen atorvastatin (LIPITOR) 10 MG tablet Take 10 mg by mouth daily.    . Calcium Carbonate-Vitamin D (CALCIUM 600+D PO) Take 1 tablet by mouth daily.    . cetirizine (ZYRTEC) 10 MG tablet Take 10 mg by mouth daily.    . cholecalciferol (VITAMIN D) 1000 units tablet Take 2,000 Units by mouth daily.    Marland Kitchen gabapentin (NEURONTIN) 300 MG capsule Take 300 mg by mouth daily.     Marland Kitchen glimepiride (AMARYL) 1 MG tablet Take 1 mg by mouth 2 (two) times daily.    Marland Kitchen levothyroxine (SYNTHROID, LEVOTHROID) 50 MCG tablet Take 50 mcg by mouth daily before breakfast.    . metFORMIN (GLUCOPHAGE-XR) 500 MG 24 hr tablet Take 1,000 mg by mouth 2 (two) times daily.    . Misc Natural Products (OSTEO BI-FLEX ADV TRIPLE ST) TABS Take 1 tablet by mouth 2 (two) times daily.    . Multiple Vitamin (MULTIVITAMIN) capsule Take 1 capsule by mouth daily.    . ranitidine (ZANTAC) 75 MG tablet Take 75 mg by mouth daily.    Marland Kitchen tetrahydrozoline-zinc (VISINE-AC) 0.05-0.25 % ophthalmic solution Place 1 drop into both eyes 2 (two) times daily as needed (dry eyes).    . triamterene-hydrochlorothiazide (MAXZIDE) 75-50 MG tablet Take 1 tablet by mouth daily.    Loura Pardon Salicylate (ASPERCREME EX) Apply 1 application topically daily as needed (joint pain).    . vitamin B-12  (CYANOCOBALAMIN) 500 MCG tablet Take 500 mcg by mouth daily.    . clopidogrel (PLAVIX) 75 MG tablet Take 1 tablet (75 mg total) by mouth daily with breakfast. (Patient  not taking: Reported on 11/06/2017) 90 tablet 3   No current facility-administered medications for this visit.     REVIEW OF SYSTEMS:   Constitutional: Denies fevers, chills or abnormal night sweats (+) fatigue (+) decreased appetite  Eyes: Denies blurriness of vision, double vision or watery eyes Ears, nose, mouth, throat, and face: Denies mucositis or sore throat Respiratory: Denies cough, dyspnea or wheezes Cardiovascular: Denies palpitation, chest discomfort (+) lower extremity swelling  Gastrointestinal:  Denies nausea, heartburn or change in bowel habits (+) chronic diarrhea, worsening lately (+) left abdomen "soreness (+) intermittent rectal bleeding  Skin: Denies abnormal skin rashes Lymphatics: Denies new lymphadenopathy or easy bruising Neurological:Denies new weaknesses (+) peripheral neuropathy  MSK: (+) right back/buttock pain radiates down right leg (+) cane  Behavioral/Psych: (+) depression (+) hopelessness (+) Denies SI/HI All other systems were reviewed with the patient and are negative.  PHYSICAL EXAMINATION: ECOG PERFORMANCE STATUS: 1 - Symptomatic but completely ambulatory  Vitals:   11/06/17 1340  BP: (!) 157/66  Pulse: 84  Resp: 18  Temp: 98.5 F (36.9 C)  SpO2: 100%   Filed Weights   11/06/17 1340  Weight: 169 lb 12.8 oz (77 kg)    GENERAL:alert, no distress and comfortable SKIN: no rashes or significant lesions EYES: sclera clear OROPHARYNX:no thrush or ulcers  LYMPH:  no palpable cervical, supraclavicular, axillary, or inguinal lymphadenopathy  LUNGS: clear to auscultation with normal breathing effort HEART: regular rate & rhythm, mild bilateral pitting lower extremity edema ABDOMEN:abdomen soft, non-tender and normal bowel sounds Musculoskeletal:no cyanosis of digits and no  clubbing  PSYCH: alert & oriented x 3 with fluent speech  LABORATORY DATA:  I have reviewed the data as listed CBC Latest Ref Rng & Units 11/06/2017 08/10/2017 05/09/2017  WBC 4.0 - 10.5 K/uL 5.2 5.6 6.4  Hemoglobin 12.0 - 15.0 g/dL 8.3(L) 8.7(L) 9.9(L)  Hematocrit 36.0 - 46.0 % 27.8(L) 26.6(L) 30.1(L)  Platelets 150 - 400 K/uL 485(H) 537(H) 388   CMP Latest Ref Rng & Units 11/06/2017 10/25/2017 08/10/2017  Glucose 70 - 99 mg/dL 151(H) - 157(H)  BUN 8 - 23 mg/dL 11 - 13  Creatinine 0.44 - 1.00 mg/dL 0.95 0.90 1.03(H)  Sodium 135 - 145 mmol/L 134(L) - 136  Potassium 3.5 - 5.1 mmol/L 4.1 - 5.3(H)  Chloride 98 - 111 mmol/L 97(L) - 95(L)  CO2 22 - 32 mmol/L 27 - 25  Calcium 8.9 - 10.3 mg/dL 9.8 - 10.7(H)  Total Protein 6.5 - 8.1 g/dL 7.5 - 7.1  Total Bilirubin 0.3 - 1.2 mg/dL 0.2(L) - <0.2  Alkaline Phos 38 - 126 U/L 81 - 97  AST 15 - 41 U/L 20 - 16  ALT 0 - 44 U/L 10 - 12    PATHOLOGY  11/06/17:  Rectum, at 10-17 cm, mass biopsy -invasive adenocarcinoma, moderately differentiated  ICH stains for MLH1, MSH2, MSH6, and PMS2 are intact (normal)   PROCEDURE Colonoscopy per Dr. Collene Mares 10/04/17  shows a large, non-obstructing horse-shoe shaped mass was found in the recto-sigmoid colon at 10 cm; the mass was partially circumferential involving one-half of the lumen circumference. The mass measured seven cm in length  RADIOGRAPHIC STUDIES: I have personally reviewed the radiological images as listed and agreed with the findings in the report. Ct Chest W Contrast  Result Date: 10/26/2017 CLINICAL DATA:  Patient with colonic mass found on colonoscopy 3 months prior. Abdominal distension. EXAM: CT CHEST, ABDOMEN, AND PELVIS WITH CONTRAST TECHNIQUE: Multidetector CT imaging of the chest, abdomen and  pelvis was performed following the standard protocol during bolus administration of intravenous contrast. CONTRAST:  129m OMNIPAQUE IOHEXOL 300 MG/ML  SOLN COMPARISON:  None. FINDINGS: CT CHEST  FINDINGS Cardiovascular: Normal heart size. Trace fluid superior pericardial recess. Thoracic aortic vascular calcifications. Coronary arterial vascular calcifications. Mediastinum/Nodes: No enlarged axillary, mediastinal or hilar lymphadenopathy. Normal appearance of the esophagus. Lungs/Pleura: Central airways are patent. 4 mm subpleural left lower lobe nodule (image 89; series 7). 5 mm subpleural left lower lobe nodule (image 83; series 7). 3 mm subpleural left lower lobe nodule (image 84; series 7). 5 mm subpleural left lower lobe nodule (image 35; series 7). No large area pulmonary consolidation. No pleural effusion or pneumothorax. Musculoskeletal: Thoracic spine degenerative changes. No aggressive or acute appearing osseous lesions. CT ABDOMEN PELVIS FINDINGS Hepatobiliary: Liver is normal in size and contour. No focal hepatic lesions identified. Gallbladder is unremarkable. No intrahepatic or extrahepatic biliary ductal dilatation. Pancreas: Unremarkable Spleen: Unremarkable Adrenals/Urinary Tract: Normal adrenal glands. Kidneys enhance symmetrically with contrast. Subcentimeter too small to characterize low-attenuation renal lesions. No hydronephrosis. Urinary bladder is unremarkable. Stomach/Bowel: There is a peripheral 3.8 cm mass involving the left lateral aspect of the sigmoid colon (image 96; series 2) (image 113; series 5). No evidence for upstream bowel obstruction. Descending and sigmoid colonic diverticulosis without CT evidence for acute diverticulitis. Normal appendix. No evidence for bowel obstruction. No free fluid or free intraperitoneal air. Normal morphology of the stomach. Vascular/Lymphatic: Normal caliber abdominal aorta. Peripheral calcified atherosclerotic plaque. There is a 1.0 cm left pericolonic lymph node adjacent to the sigmoid colon (image 95; series 2). Additional 0.5 cm left pericolonic lymph node (image 99; series 2). Additional 0.9 cm left pericolonic lymph node (image 96;  series 5). Reproductive: Status post hysterectomy. Other: None. Musculoskeletal: Lumbar spine degenerative changes. No aggressive or acute appearing osseous lesions. IMPRESSION: 1. Irregular eccentric mass within the sigmoid colon. There are at least 3 abnormal appearing lymph node adjacent to the sigmoid colon at the level of the mass concerning for the possibility of local nodal metastatic disease. 2. Multiple pulmonary nodules as described above. These are indeterminate in etiology. Recommend attention on follow-up. Electronically Signed   By: DLovey NewcomerM.D.   On: 10/26/2017 08:53   Ct Abdomen Pelvis W Contrast  Result Date: 10/26/2017 CLINICAL DATA:  Patient with colonic mass found on colonoscopy 3 months prior. Abdominal distension. EXAM: CT CHEST, ABDOMEN, AND PELVIS WITH CONTRAST TECHNIQUE: Multidetector CT imaging of the chest, abdomen and pelvis was performed following the standard protocol during bolus administration of intravenous contrast. CONTRAST:  1015mOMNIPAQUE IOHEXOL 300 MG/ML  SOLN COMPARISON:  None. FINDINGS: CT CHEST FINDINGS Cardiovascular: Normal heart size. Trace fluid superior pericardial recess. Thoracic aortic vascular calcifications. Coronary arterial vascular calcifications. Mediastinum/Nodes: No enlarged axillary, mediastinal or hilar lymphadenopathy. Normal appearance of the esophagus. Lungs/Pleura: Central airways are patent. 4 mm subpleural left lower lobe nodule (image 89; series 7). 5 mm subpleural left lower lobe nodule (image 83; series 7). 3 mm subpleural left lower lobe nodule (image 84; series 7). 5 mm subpleural left lower lobe nodule (image 35; series 7). No large area pulmonary consolidation. No pleural effusion or pneumothorax. Musculoskeletal: Thoracic spine degenerative changes. No aggressive or acute appearing osseous lesions. CT ABDOMEN PELVIS FINDINGS Hepatobiliary: Liver is normal in size and contour. No focal hepatic lesions identified. Gallbladder is  unremarkable. No intrahepatic or extrahepatic biliary ductal dilatation. Pancreas: Unremarkable Spleen: Unremarkable Adrenals/Urinary Tract: Normal adrenal glands. Kidneys enhance symmetrically with contrast. Subcentimeter too  small to characterize low-attenuation renal lesions. No hydronephrosis. Urinary bladder is unremarkable. Stomach/Bowel: There is a peripheral 3.8 cm mass involving the left lateral aspect of the sigmoid colon (image 96; series 2) (image 113; series 5). No evidence for upstream bowel obstruction. Descending and sigmoid colonic diverticulosis without CT evidence for acute diverticulitis. Normal appendix. No evidence for bowel obstruction. No free fluid or free intraperitoneal air. Normal morphology of the stomach. Vascular/Lymphatic: Normal caliber abdominal aorta. Peripheral calcified atherosclerotic plaque. There is a 1.0 cm left pericolonic lymph node adjacent to the sigmoid colon (image 95; series 2). Additional 0.5 cm left pericolonic lymph node (image 99; series 2). Additional 0.9 cm left pericolonic lymph node (image 96; series 5). Reproductive: Status post hysterectomy. Other: None. Musculoskeletal: Lumbar spine degenerative changes. No aggressive or acute appearing osseous lesions. IMPRESSION: 1. Irregular eccentric mass within the sigmoid colon. There are at least 3 abnormal appearing lymph node adjacent to the sigmoid colon at the level of the mass concerning for the possibility of local nodal metastatic disease. 2. Multiple pulmonary nodules as described above. These are indeterminate in etiology. Recommend attention on follow-up. Electronically Signed   By: Lovey Newcomer M.D.   On: 10/26/2017 08:53    ASSESSMENT & PLAN: 67 year old female with h/o CAD, HTN, HL, DM, and depression with positive cologuard test and rectosigmoid colon mass    1. Adenocarcinoma of the rectosigmoid colon, moderately differentiated, MMR normal  -we reviewed her medical record with the patient in  detail. She has what appears to be localized sigmoid colon cancer -Her CT scan shows a mass in the sigmoid colon, with few abnormal local lymph nodes concerning for the possibility of local nodal metastasis, and a few small indeterminate lung nodules. Dr. Burr Medico reviewed she has low suspicion the lung nodules represent metastatic disease and to monitor in the future .  -She has not seen colorectal surgeon yet, we referred her today -We will discuss her case in GI tumor board this week or next -if her tumor is felt to be in the rectum, she will require staging MRI and likely neoadjuvant chemoRT -Likely she will proceed to surgery; will see her back afterwards to review final pathology and to determine whether she will require adjuvant chemotherapy. Patient agrees with the plan.  -Labs today show persistent anemia, Hgb 8.3; CMP unremarkable; iron studies and CEA are pending -lab, f/u in 2 months, tentatively    2. Iron deficiency anemia, secondary to #1  -Hgb 8.3 today; she is symptomatic with fatigue -she reportedly was only on oral iron for 1 month then discontinued because she did not feel benefit -she will likely require IV iron; we explained benefit and potential risk including infusion reaction and anaphylaxis. She agrees to IV Feraheme if needed.  -iron studies are pending; will arrange weekly IV Feraheme x2 if low   3. Depression -she reports long history of depression, she feels hopeless at times. Denies SI/HI.  -she has a son in town and his family, otherwise she lives alone -she does not have mental health provider, not on medication -she agrees to SW referral to talk to someone and learn about community resources, I referred her today   PLAN: -Medical record reviewed -Lab today, IV feraheme weekly x2 if iron studies are low  -Referral to colorectal surgery and social work  -Discuss in tumor board  -Tentative f/u in 2 months    Orders Placed This Encounter  Procedures  . CBC  with Differential (Cancer  Center Only)    Standing Status:   Standing    Number of Occurrences:   20    Standing Expiration Date:   11/07/2018  . CMP ([place] only)    Standing Status:   Standing    Number of Occurrences:   20    Standing Expiration Date:   11/07/2018  . Iron and TIBC    Standing Status:   Standing    Number of Occurrences:   20    Standing Expiration Date:   11/07/2018  . Ferritin    Standing Status:   Standing    Number of Occurrences:   20    Standing Expiration Date:   11/07/2018  . CEA (IN HOUSE-[facility])    Standing Status:   Standing    Number of Occurrences:   20    Standing Expiration Date:   11/07/2018  . Ambulatory referral to Colorectal Surgery    Referral Priority:   Urgent    Referral Type:   Surgical    Referral Reason:   Specialty Services Required    Requested Specialty:   General Surgery    Number of Visits Requested:   1  . Ambulatory referral to Social Work    Referral Priority:   Routine    Referral Type:   Consultation    Referral Reason:   Specialty Services Required    Number of Visits Requested:   1    All questions were answered. The patient knows to call the clinic with any problems, questions or concerns.     Alla Feeling, NP 11/06/2017   Addendum  I have seen the patient, examined her. I agree with the assessment and and plan and have edited the notes.   Nicole Sellers is a 67 yo female with PMH of DM, HTN, PAD, presented with diarrhea and abdominal pain for 2 years.  I reviewed her colonoscopy findings, biopsy results, and her CT image by myself.  She has a large mass in the rectosigmoid colon, with probable regional metastatic lymph nodes.  She has multiple small lung nodules, indeterminate, probably not related to her colon cancer. Will monitor closely.  I will review her case in GI tumor board this week, and refer her to see surgeon for hemicolectomy.  We discussed the role of adjuvant chemotherapy for stage III colon cancer.  We also discussed the role of neoadjuvant chemoRT if her tumor is truly in proximal rectum, which is probably not based on her colonoscopy findings, will review in tumor board this week. I will see her back after surgery.   Truitt Merle  11/07/2017

## 2017-11-06 ENCOUNTER — Inpatient Hospital Stay: Payer: Medicare Other | Attending: Nurse Practitioner | Admitting: Nurse Practitioner

## 2017-11-06 ENCOUNTER — Inpatient Hospital Stay: Payer: Medicare Other

## 2017-11-06 ENCOUNTER — Encounter: Payer: Self-pay | Admitting: Nurse Practitioner

## 2017-11-06 ENCOUNTER — Telehealth: Payer: Self-pay | Admitting: Hematology

## 2017-11-06 VITALS — BP 157/66 | HR 84 | Temp 98.5°F | Resp 18 | Ht 66.5 in | Wt 169.8 lb

## 2017-11-06 DIAGNOSIS — I10 Essential (primary) hypertension: Secondary | ICD-10-CM | POA: Insufficient documentation

## 2017-11-06 DIAGNOSIS — E039 Hypothyroidism, unspecified: Secondary | ICD-10-CM | POA: Diagnosis not present

## 2017-11-06 DIAGNOSIS — Z8042 Family history of malignant neoplasm of prostate: Secondary | ICD-10-CM | POA: Insufficient documentation

## 2017-11-06 DIAGNOSIS — Z809 Family history of malignant neoplasm, unspecified: Secondary | ICD-10-CM | POA: Diagnosis not present

## 2017-11-06 DIAGNOSIS — C20 Malignant neoplasm of rectum: Secondary | ICD-10-CM | POA: Insufficient documentation

## 2017-11-06 DIAGNOSIS — K219 Gastro-esophageal reflux disease without esophagitis: Secondary | ICD-10-CM | POA: Diagnosis not present

## 2017-11-06 DIAGNOSIS — D509 Iron deficiency anemia, unspecified: Secondary | ICD-10-CM | POA: Diagnosis not present

## 2017-11-06 DIAGNOSIS — E1151 Type 2 diabetes mellitus with diabetic peripheral angiopathy without gangrene: Secondary | ICD-10-CM | POA: Diagnosis not present

## 2017-11-06 DIAGNOSIS — Z79899 Other long term (current) drug therapy: Secondary | ICD-10-CM | POA: Insufficient documentation

## 2017-11-06 DIAGNOSIS — Z7982 Long term (current) use of aspirin: Secondary | ICD-10-CM | POA: Insufficient documentation

## 2017-11-06 DIAGNOSIS — E7800 Pure hypercholesterolemia, unspecified: Secondary | ICD-10-CM | POA: Diagnosis not present

## 2017-11-06 DIAGNOSIS — Z888 Allergy status to other drugs, medicaments and biological substances status: Secondary | ICD-10-CM | POA: Insufficient documentation

## 2017-11-06 DIAGNOSIS — I2510 Atherosclerotic heart disease of native coronary artery without angina pectoris: Secondary | ICD-10-CM | POA: Insufficient documentation

## 2017-11-06 DIAGNOSIS — C189 Malignant neoplasm of colon, unspecified: Secondary | ICD-10-CM

## 2017-11-06 DIAGNOSIS — K5730 Diverticulosis of large intestine without perforation or abscess without bleeding: Secondary | ICD-10-CM | POA: Diagnosis not present

## 2017-11-06 DIAGNOSIS — K429 Umbilical hernia without obstruction or gangrene: Secondary | ICD-10-CM | POA: Insufficient documentation

## 2017-11-06 DIAGNOSIS — C19 Malignant neoplasm of rectosigmoid junction: Secondary | ICD-10-CM | POA: Insufficient documentation

## 2017-11-06 DIAGNOSIS — E1140 Type 2 diabetes mellitus with diabetic neuropathy, unspecified: Secondary | ICD-10-CM

## 2017-11-06 DIAGNOSIS — F329 Major depressive disorder, single episode, unspecified: Secondary | ICD-10-CM | POA: Diagnosis not present

## 2017-11-06 DIAGNOSIS — Z7984 Long term (current) use of oral hypoglycemic drugs: Secondary | ICD-10-CM | POA: Insufficient documentation

## 2017-11-06 DIAGNOSIS — R918 Other nonspecific abnormal finding of lung field: Secondary | ICD-10-CM | POA: Diagnosis not present

## 2017-11-06 LAB — CMP (CANCER CENTER ONLY)
ALT: 10 U/L (ref 0–44)
AST: 20 U/L (ref 15–41)
Albumin: 3.6 g/dL (ref 3.5–5.0)
Alkaline Phosphatase: 81 U/L (ref 38–126)
Anion gap: 10 (ref 5–15)
BUN: 11 mg/dL (ref 8–23)
CO2: 27 mmol/L (ref 22–32)
Calcium: 9.8 mg/dL (ref 8.9–10.3)
Chloride: 97 mmol/L — ABNORMAL LOW (ref 98–111)
Creatinine: 0.95 mg/dL (ref 0.44–1.00)
GFR, Est AFR Am: >60 mL/min (ref >60)
GFR, Estimated: 60 mL/min — ABNORMAL LOW (ref >60)
Glucose, Bld: 151 mg/dL — ABNORMAL HIGH (ref 70–99)
Potassium: 4.1 mmol/L (ref 3.5–5.1)
Sodium: 134 mmol/L — ABNORMAL LOW (ref 135–145)
Total Bilirubin: 0.2 mg/dL — ABNORMAL LOW (ref 0.3–1.2)
Total Protein: 7.5 g/dL (ref 6.5–8.1)

## 2017-11-06 LAB — CBC WITH DIFFERENTIAL (CANCER CENTER ONLY)
Abs Immature Granulocytes: 0.01 10*3/uL (ref 0.00–0.07)
Basophils Absolute: 0 10*3/uL (ref 0.0–0.1)
Basophils Relative: 1 %
Eosinophils Absolute: 0.2 10*3/uL (ref 0.0–0.5)
Eosinophils Relative: 5 %
HCT: 27.8 % — ABNORMAL LOW (ref 36.0–46.0)
Hemoglobin: 8.3 g/dL — ABNORMAL LOW (ref 12.0–15.0)
Immature Granulocytes: 0 %
Lymphocytes Relative: 35 %
Lymphs Abs: 1.8 10*3/uL (ref 0.7–4.0)
MCH: 23.5 pg — ABNORMAL LOW (ref 26.0–34.0)
MCHC: 29.9 g/dL — ABNORMAL LOW (ref 30.0–36.0)
MCV: 78.8 fL — ABNORMAL LOW (ref 80.0–100.0)
Monocytes Absolute: 0.4 10*3/uL (ref 0.1–1.0)
Monocytes Relative: 8 %
Neutro Abs: 2.7 10*3/uL (ref 1.7–7.7)
Neutrophils Relative %: 51 %
Platelet Count: 485 10*3/uL — ABNORMAL HIGH (ref 150–400)
RBC: 3.53 MIL/uL — ABNORMAL LOW (ref 3.87–5.11)
RDW: 15.7 % — ABNORMAL HIGH (ref 11.5–15.5)
WBC Count: 5.2 10*3/uL (ref 4.0–10.5)
nRBC: 0 % (ref 0.0–0.2)

## 2017-11-06 NOTE — Telephone Encounter (Signed)
Gave pt avs and calendar  °

## 2017-11-06 NOTE — Progress Notes (Signed)
  Oncology Nurse Navigator Documentation  Navigator Location: [facility] (11/06/17 1409) Referral date to RadOnc/MedOnc: 10/31/17 (11/06/17 1409) )Navigator Encounter Type: Initial MedOnc (11/06/17 1409)   Met with Nicole Sellers. Explained role of nurse navigator. Educational information provided on colorectal cancer  Referral made to [facility] and to social work regarding need for transportation.  Contact names and phone numbers were provided for entire [facility] team.  Teach back method was used.  Patient does not have transportation.  Will continue to follow as needed Abnormal Finding Date: 10/04/17 (11/06/17 1409) Confirmed Diagnosis Date: 10/06/17 (11/06/17 1409)               Patient Visit Type: MedOnc;Initial (11/06/17 1409) Treatment Phase: Pre-Tx/Tx Discussion (11/06/17 1409) Barriers/Navigation Needs: Transportation (11/06/17 1409)   Interventions: Referrals;Education(Referral to Social Work) (11/06/17 1409) Referrals: Social Work;Other(CCS Surgeon) (11/06/17 1409)   Education Method: Verbal;Written (11/06/17 1409)  Support Groups/Services: Friends and Family (11/06/17 1409)   Acuity: Level 3 (11/06/17 1409)     Acuity Level 3: Transportation issues;Ongoing guidance and education provided throughout treatment (11/06/17 1409)   Time Spent with Patient: 15 (11/06/17 1409)

## 2017-11-07 LAB — FERRITIN: Ferritin: 7 ng/mL — ABNORMAL LOW (ref 11–307)

## 2017-11-07 LAB — IRON AND TIBC
Iron: 20 ug/dL — ABNORMAL LOW (ref 41–142)
Saturation Ratios: 5 % — ABNORMAL LOW (ref 21–57)
TIBC: 435 ug/dL (ref 236–444)
UIBC: 415 ug/dL

## 2017-11-07 LAB — CEA (IN HOUSE-CHCC): CEA (CHCC-In House): 55.85 ng/mL — ABNORMAL HIGH (ref 0.00–5.00)

## 2017-11-08 ENCOUNTER — Other Ambulatory Visit: Payer: Self-pay | Admitting: Nurse Practitioner

## 2017-11-10 ENCOUNTER — Telehealth: Payer: Self-pay | Admitting: Emergency Medicine

## 2017-11-10 NOTE — Telephone Encounter (Addendum)
Vm left for patient to call back regarding this note.    ----- Message ----- From: Alla Feeling, NP Sent: 11/07/2017   1:38 PM EDT To: [facility]  Please let her know labs. CEA is elevated, will serve as a baseline for [place] to monitor going forward. Iron is very low, we discussed IV Feraheme and she agreed. I will send schedule message for feraheme weekly x2 starting next week, please also encourage her to restart oral iron 1-2 tabs daily if she can tolerate it. She has an appt with colorectal surgeon Dr. Marcello Moores 11/4 at 9:40, she should hear from that office to confirm the apt. Let us know if she has other questions.  Thanks, Regan Rakers

## 2017-11-14 ENCOUNTER — Ambulatory Visit (HOSPITAL_BASED_OUTPATIENT_CLINIC_OR_DEPARTMENT_OTHER)
Admission: RE | Admit: 2017-11-14 | Discharge: 2017-11-14 | Disposition: A | Discharge status: Home / Self Care | Payer: Medicare Other | Source: Ambulatory Visit | Attending: Internal Medicine | Admitting: Internal Medicine

## 2017-11-14 ENCOUNTER — Ambulatory Visit (HOSPITAL_COMMUNITY)
Admission: RE | Admit: 2017-11-14 | Discharge: 2017-11-14 | Disposition: A | Discharge status: Home / Self Care | Payer: Medicare Other | Source: Ambulatory Visit | Attending: Internal Medicine | Admitting: Internal Medicine

## 2017-11-14 ENCOUNTER — Telehealth: Payer: Self-pay | Admitting: Hematology

## 2017-11-14 ENCOUNTER — Other Ambulatory Visit: Payer: Self-pay | Admitting: General Surgery

## 2017-11-14 DIAGNOSIS — I739 Peripheral vascular disease, unspecified: Secondary | ICD-10-CM | POA: Diagnosis not present

## 2017-11-14 DIAGNOSIS — Z95820 Peripheral vascular angioplasty status with implants and grafts: Secondary | ICD-10-CM | POA: Diagnosis not present

## 2017-11-14 DIAGNOSIS — C20 Malignant neoplasm of rectum: Secondary | ICD-10-CM

## 2017-11-14 NOTE — Telephone Encounter (Signed)
Scheduled appt per 11/5 sch message - pt Is aware of appt date and time

## 2017-11-16 ENCOUNTER — Encounter: Payer: Self-pay | Admitting: Radiation Oncology

## 2017-11-16 ENCOUNTER — Telehealth: Payer: Self-pay | Admitting: Radiation Oncology

## 2017-11-16 ENCOUNTER — Ambulatory Visit
Admission: RE | Admit: 2017-11-16 | Discharge: 2017-11-16 | Disposition: A | Discharge status: Home / Self Care | Payer: Medicare Other | Source: Ambulatory Visit | Attending: General Surgery | Admitting: General Surgery

## 2017-11-16 DIAGNOSIS — C20 Malignant neoplasm of rectum: Secondary | ICD-10-CM

## 2017-11-16 MED ORDER — GADOBENATE DIMEGLUMINE 529 MG/ML IV SOLN
15.0000 mL | Freq: Once | INTRAVENOUS | Status: AC | PRN
  Administered 2017-11-16: 15 mL via INTRAVENOUS

## 2017-11-16 NOTE — Telephone Encounter (Signed)
error 

## 2017-11-17 ENCOUNTER — Inpatient Hospital Stay (HOSPITAL_BASED_OUTPATIENT_CLINIC_OR_DEPARTMENT_OTHER): Payer: Medicare Other | Admitting: Hematology

## 2017-11-17 ENCOUNTER — Inpatient Hospital Stay: Payer: Medicare Other | Attending: Nurse Practitioner

## 2017-11-17 ENCOUNTER — Encounter: Payer: Self-pay | Admitting: *Deleted

## 2017-11-17 ENCOUNTER — Telehealth: Payer: Self-pay

## 2017-11-17 VITALS — BP 112/80 | HR 74 | Temp 98.1°F | Resp 18

## 2017-11-17 DIAGNOSIS — I10 Essential (primary) hypertension: Secondary | ICD-10-CM

## 2017-11-17 DIAGNOSIS — M129 Arthropathy, unspecified: Secondary | ICD-10-CM | POA: Insufficient documentation

## 2017-11-17 DIAGNOSIS — Z79899 Other long term (current) drug therapy: Secondary | ICD-10-CM | POA: Diagnosis not present

## 2017-11-17 DIAGNOSIS — C20 Malignant neoplasm of rectum: Secondary | ICD-10-CM

## 2017-11-17 DIAGNOSIS — D630 Anemia in neoplastic disease: Secondary | ICD-10-CM | POA: Insufficient documentation

## 2017-11-17 DIAGNOSIS — F329 Major depressive disorder, single episode, unspecified: Secondary | ICD-10-CM | POA: Insufficient documentation

## 2017-11-17 DIAGNOSIS — I2510 Atherosclerotic heart disease of native coronary artery without angina pectoris: Secondary | ICD-10-CM

## 2017-11-17 DIAGNOSIS — C19 Malignant neoplasm of rectosigmoid junction: Secondary | ICD-10-CM

## 2017-11-17 DIAGNOSIS — E785 Hyperlipidemia, unspecified: Secondary | ICD-10-CM

## 2017-11-17 DIAGNOSIS — G629 Polyneuropathy, unspecified: Secondary | ICD-10-CM

## 2017-11-17 DIAGNOSIS — R599 Enlarged lymph nodes, unspecified: Secondary | ICD-10-CM | POA: Insufficient documentation

## 2017-11-17 DIAGNOSIS — K219 Gastro-esophageal reflux disease without esophagitis: Secondary | ICD-10-CM

## 2017-11-17 DIAGNOSIS — Z7984 Long term (current) use of oral hypoglycemic drugs: Secondary | ICD-10-CM | POA: Insufficient documentation

## 2017-11-17 DIAGNOSIS — D689 Coagulation defect, unspecified: Secondary | ICD-10-CM | POA: Insufficient documentation

## 2017-11-17 DIAGNOSIS — Z5111 Encounter for antineoplastic chemotherapy: Secondary | ICD-10-CM | POA: Diagnosis not present

## 2017-11-17 DIAGNOSIS — R634 Abnormal weight loss: Secondary | ICD-10-CM | POA: Insufficient documentation

## 2017-11-17 DIAGNOSIS — R630 Anorexia: Secondary | ICD-10-CM | POA: Diagnosis not present

## 2017-11-17 DIAGNOSIS — R918 Other nonspecific abnormal finding of lung field: Secondary | ICD-10-CM

## 2017-11-17 DIAGNOSIS — Z7982 Long term (current) use of aspirin: Secondary | ICD-10-CM

## 2017-11-17 DIAGNOSIS — E1140 Type 2 diabetes mellitus with diabetic neuropathy, unspecified: Secondary | ICD-10-CM | POA: Insufficient documentation

## 2017-11-17 DIAGNOSIS — Z8042 Family history of malignant neoplasm of prostate: Secondary | ICD-10-CM

## 2017-11-17 DIAGNOSIS — K5900 Constipation, unspecified: Secondary | ICD-10-CM | POA: Insufficient documentation

## 2017-11-17 DIAGNOSIS — R05 Cough: Secondary | ICD-10-CM | POA: Insufficient documentation

## 2017-11-17 DIAGNOSIS — K1230 Oral mucositis (ulcerative), unspecified: Secondary | ICD-10-CM | POA: Diagnosis not present

## 2017-11-17 DIAGNOSIS — Z87891 Personal history of nicotine dependence: Secondary | ICD-10-CM | POA: Diagnosis not present

## 2017-11-17 DIAGNOSIS — E039 Hypothyroidism, unspecified: Secondary | ICD-10-CM

## 2017-11-17 MED ORDER — SODIUM CHLORIDE 0.9 % IV SOLN
510.0000 mg | Freq: Once | INTRAVENOUS | Status: AC
  Administered 2017-11-17: 510 mg via INTRAVENOUS
  Filled 2017-11-17: qty 17

## 2017-11-17 MED ORDER — SODIUM CHLORIDE 0.9 % IV SOLN
Freq: Once | INTRAVENOUS | Status: AC
  Administered 2017-11-17: 15:00:00 via INTRAVENOUS
  Filled 2017-11-17: qty 250

## 2017-11-17 NOTE — Research (Signed)
[employer] 201801 BLOOD SAMPLE COLLECTION TO EVALUATE BIOMARKERS IN SUBJECTSWITH UNTREATED SOLID TUMORS.  Dr. Burr Medico referred patient for the above study. Met with patient in infusion room during IV iron infusion. Introduced myself and informed her about this study. Informed patient that this study involves aone time collection of bloodand demographic/medical history information to beused for research. Informed patient that participation is completely voluntary.Blood must be collected prior to starting any cancer treatment or surgery. Patients will be given a [employer] gift card for their participation. Patient stated she was interested in learning more about this study. I providedher with the hippa embedded consent form for protocol version2.0, IRB approved 02/22/17. Informed her consent has to be signed prior to drawing blood for the study. Patient denied any questions at this time and states it is ok for research nurse to call her on Wednesday next week before her next appointment. Informed patient she would be asked to come in one hour early to sign consent and have lab appointment before her Radiation Oncology appointment. Gave patient my business card and a clinical researchinformational brochure. Encouraged her to call research nurse if any questions before our next contact. Sheverbalized understanding. Thanked patient very much for her time today and willingness to consider this study.  Foye Spurling, BSN, RN Clinical Research Nurse 11/17/2017 4:01 PM

## 2017-11-17 NOTE — Progress Notes (Signed)
Nicole Sellers  Clinic Follow up Note   Patient Care Team: Reynold Bowen, MD as PCP - General (Endocrinology) Lorretta Harp, MD as PCP - Cardiology (Cardiology)   Date of Service:  11/17/2017   CHIEF COMPLAINTS:  F/u for rectal cancer  SUMMARY OF ONCOLOGIC HISTORY: Oncology History   Cancer Staging Rectal cancer [facility]) Staging form: Colon and Rectum, AJCC 8th Edition - Clinical stage from 10/04/2017: Stage IIIB (cT3, cN1, cM0) - Signed by Truitt Merle, MD on 11/18/2017       Rectal cancer ([place])   10/04/2017 Procedure    Colonoscopy per Dr. Collene Mares 10/04/17 shows a large, non-obstructing horse-shoe shaped mass was found in the recto-sigmoid colon at 10 cm; the mass was partially circumferential involving one-half of the lumen circumference. The mass measured seven cm in length    10/04/2017 Initial Biopsy    Rectum, at 10-17 cm, mass biopsy -invasive adenocarcinoma, moderately differentiated  ICH stains for MLH1, MSH2, MSH6, and PMS2 are intact (normal)    10/04/2017 Cancer Staging    Staging form: Colon and Rectum, AJCC 8th Edition - Clinical stage from 10/04/2017: Stage IIIB (cT3, cN1, cM0) - Signed by Truitt Merle, MD on 11/18/2017    10/25/2017 Imaging    CT CAP IMPRESSION: 1. Irregular eccentric mass within the sigmoid colon. There are at least 3 abnormal appearing lymph node adjacent to the sigmoid colon at the level of the mass concerning for the possibility of local nodal metastatic disease. 2. Multiple pulmonary nodules as described above. These are indeterminate in etiology. Recommend attention on follow-up.    11/06/2017 Initial Diagnosis    Adenocarcinoma of colon ([place])    11/16/2017 Imaging    Pelvic MRI 11/16/17  IMPRESSION: Rectal adenocarcinoma T stage:   T3 Rectal adenocarcinoma N stage:  N1 Distance from tumor to the anal sphincter is 6.9 cm.     HISTORY OF PRESENTING ILLNESS: 11/06/17 Nicole Sellers  67 y.o. female is here because of recto-sigmoid mass. She presented to GI Dr. Collene Mares on 07/18/17 reporting 2 year h/o diarrhea, abdominal pain, gas, and bloating. Cologuard testing on 08/01/17 was positive. She reported large amount of rectal bleeding and fatigue to her cardiologist in August. She underwent screening colonoscopy on 10/04/17 which showed a large 7-8 cm horse-shoe shaped tumor in the recto-sigmoid colon. Biopsy of the rectum at 10-17 cm confirmed invasive adenocarcinoma, moderately differentiated, MMR normal by IHC. Staging CT CAP on 10/26/17 showed an irregular eccentric mass within the sigmoid colon and at least 3 abnormal lymph nodes at the level of the mass concerning for nodal metastasis, as well as multiple small indeterminate pulmonary lung nodules.   PMH is significant for HTN, HL, DM, neuropathy, depression, CAD, s/p vascular stent x2 right leg 2014, 2019, hypothyroidism, arthritis, and umbilical hernia s/p repair. She is followed by Dr. Gwenlyn Found, cardiology. She recently stopped taking plavix with rectal bleeding. She never had previous colonoscopy. Mammogram is up to date. She is a former tobacco smoker of 50 years. Reports history of moderate alcohol use, and denies illicit drug use. Family history is positive for prostate cancer in father and brother. A second brother had "tumors on his neck and body" that requires chemotherapy. Denies colon, breast, or GYN cancer in the family. Socially, she lives alone. She is independent of ADLs but does not drive due to poor vision. Ambulates with a cane. Son lives in town with his family.   Today, she presents by  herself. She has increased fatigue and decreased appetite lately, but no weight loss. She has diarrhea anywhere from 0-5 episodes per day, takes imodium occasionally but prefers to "let it run its course." She denies recent blood in stool. Has left side abdominal "soreness" but does not require pain medication. Has right back/buttock pain that  radiates down her right leg, uses a cane. Neuropathy in fer feet is controlled with gabapentin. She has longstanding depression and occasionally feels hopeless, denies SI/HI. Has had a dry cough for many years, no recent fever, chills, chest pain, or dyspnea.   CURRENT THERAPY: pending neoadjuvant chemo and radiation   INTERVAL HISTORY:  Nicole Sellers is here for follow up of her rectal cancer cancer. She presents in the infusion room for iv iron and I saw her there. She notes she is doing well. She was seen by Dr. Marcello Moores last week.    She notes she lives alone but she has a son and 2 granddaughters who are around who can help her. She traveled here with a ride provided through her insurance. She notes she would need more transportation as they only provided a certain number a rides a year. She is able to be independent at home.   She notes her bowel movements are non-bloody and loose, 1-2 times a day, she may have occasional constipation or have hard stools.  She denies significant rectal pain or other pain, she has some mild to moderate fatigue, able to function well at home. Review of 10 systems otherwise negative.  REVIEW OF SYSTEMS:   Constitutional: Denies fevers, chills or abnormal weight loss Eyes: Denies blurriness of vision Ears, nose, mouth, throat, and face: Denies mucositis or sore throat Respiratory: Denies cough, dyspnea or wheezes Cardiovascular: Denies palpitation, chest discomfort or lower extremity swelling Gastrointestinal:  Denies nausea, heartburn or change in bowel habits Skin: Denies abnormal skin rashes Lymphatics: Denies new lymphadenopathy or easy bruising Neurological:Denies numbness, tingling or new weaknesses Behavioral/Psych: Mood is stable, no new changes  All other systems were reviewed with the patient and are negative.  MEDICAL HISTORY:  Past Medical History:  Diagnosis Date  . Allergy   . Arthritis    "joints sometimes" (12/25/2012)  . Clotting  disorder ([place])   . Colon cancer ([place])   . Critical lower limb ischemia   . Gangrene of toe, Rt second toe 12/25/2012  . GERD (gastroesophageal reflux disease)   . High cholesterol   . Hypertension   . Hypothyroidism   . PAD (peripheral artery disease) ([place])   . S/P arterial stent, 12/25/13, successful diamondback orbital rotational arthrectomy, PTA using chocolate  balloon and stenting using I DEV stent of long segment calcified high-grade proximal and mid r 12/25/2012  . Tobacco abuse   . Type II diabetes mellitus ([place])     SURGICAL HISTORY: Past Surgical History:  Procedure Laterality Date  . ABDOMINAL AORTAGRAM  12/20/2012   Procedure: ABDOMINAL AORTAGRAM;  Surgeon: Lorretta Harp, MD;  Location: [facility] CATH LAB;  Service: Cardiovascular;;  . ABDOMINAL HYSTERECTOMY  2000  . ANGIOPLASTY / STENTING FEMORAL Right 12/25/2012  . ATHERECTOMY Right 12/25/2012   Procedure: ATHERECTOMY;  Surgeon: Lorretta Harp, MD;  Location: [facility] CATH LAB;  Service: Cardiovascular;  Laterality: Right;  right SFA  . HERNIA REPAIR    . LOWER EXTREMITY ANGIOGRAM N/A 12/20/2012   Procedure: LOWER EXTREMITY ANGIOGRAM;  Surgeon: Lorretta Harp, MD;  Location: [facility] CATH LAB;  Service: Cardiovascular;  Laterality: N/A;  . LOWER  EXTREMITY ANGIOGRAM N/A 12/25/2012   Procedure: LOWER EXTREMITY ANGIOGRAM;  Surgeon: Lorretta Harp, MD;  Location: [facility] CATH LAB;  Service: Cardiovascular;  Laterality: N/A;  . LOWER EXTREMITY INTERVENTION  05/08/2017  . LOWER EXTREMITY INTERVENTION Bilateral 05/08/2017   Procedure: LOWER EXTREMITY INTERVENTION;  Surgeon: Lorretta Harp, MD;  Location: [place] CV LAB;  Service: Cardiovascular;  Laterality: Bilateral;  . PERIPHERAL VASCULAR BALLOON ANGIOPLASTY Right 05/08/2017   Procedure: PERIPHERAL VASCULAR BALLOON ANGIOPLASTY;  Surgeon: Lorretta Harp, MD;  Location: [place] CV LAB;  Service: Cardiovascular;  Laterality: Right;  sfa  . PERIPHERAL VASCULAR INTERVENTION Right  05/08/2017   Procedure: PERIPHERAL VASCULAR INTERVENTION;  Surgeon: Lorretta Harp, MD;  Location: [place] CV LAB;  Service: Cardiovascular;  Laterality: Right;  ext iliac  . TOE SURGERY Right    2n toe   . UMBILICAL HERNIA REPAIR  2000    I have reviewed the social history and family history with the patient and they are unchanged from previous note.  ALLERGIES:  is allergic to kiwi extract; lisinopril; and plavix [clopidogrel bisulfate].  MEDICATIONS:  Current Outpatient Medications  Medication Sig Dispense Refill  . acetaminophen (TYLENOL) 500 MG tablet Take 500 mg by mouth daily as needed for moderate pain or headache.    . albuterol (PROVENTIL HFA;VENTOLIN HFA) 108 (90 BASE) MCG/ACT inhaler Inhale 2 puffs into the lungs every 6 (six) hours as needed for wheezing or shortness of breath.    Marland Kitchen amLODipine (NORVASC) 5 MG tablet Take 1 tablet (5 mg total) by mouth daily. 90 tablet 2  . aspirin 81 MG tablet Take 81 mg by mouth daily.    Marland Kitchen atorvastatin (LIPITOR) 10 MG tablet TAKE 1 TABLET BY MOUTH  EVERY DAY 90 tablet 0  . Calcium Carbonate-Vitamin D (CALCIUM 600+D PO) Take 1 tablet by mouth daily.    . cetirizine (ZYRTEC) 10 MG tablet Take 10 mg by mouth daily.    . cholecalciferol (VITAMIN D) 1000 units tablet Take 2,000 Units by mouth daily.    . clopidogrel (PLAVIX) 75 MG tablet Take 1 tablet (75 mg total) by mouth daily with breakfast. (Patient not taking: Reported on 11/06/2017) 90 tablet 3  . gabapentin (NEURONTIN) 300 MG capsule Take 300 mg by mouth daily.     Marland Kitchen glimepiride (AMARYL) 1 MG tablet Take 1 mg by mouth 2 (two) times daily.    Marland Kitchen levothyroxine (SYNTHROID, LEVOTHROID) 50 MCG tablet TAKE 1 TABLET BY MOUTH  EVERY DAY 90 tablet 0  . metFORMIN (GLUCOPHAGE-XR) 500 MG 24 hr tablet Take 1,000 mg by mouth 2 (two) times daily.    . Misc Natural Products (OSTEO BI-FLEX ADV TRIPLE ST) TABS Take 1 tablet by mouth 2 (two) times daily.    . Multiple Vitamin (MULTIVITAMIN) capsule  Take 1 capsule by mouth daily.    . ranitidine (ZANTAC) 75 MG tablet Take 75 mg by mouth daily.    Marland Kitchen tetrahydrozoline-zinc (VISINE-AC) 0.05-0.25 % ophthalmic solution Place 1 drop into both eyes 2 (two) times daily as needed (dry eyes).    . triamterene-hydrochlorothiazide (MAXZIDE) 75-50 MG tablet Take 1 tablet by mouth daily.    Loura Pardon Salicylate (ASPERCREME EX) Apply 1 application topically daily as needed (joint pain).    . vitamin B-12 (CYANOCOBALAMIN) 500 MCG tablet Take 500 mcg by mouth daily.     No current facility-administered medications for this visit.    Facility-Administered Medications Ordered in Other Visits  Medication Dose Route Frequency Provider Last Rate  Last Dose  . ferumoxytol (FERAHEME) 510 mg in sodium chloride 0.9 % 100 mL IVPB  510 mg Intravenous Once Alla Feeling, NP        PHYSICAL EXAMINATION: ECOG PERFORMANCE STATUS: 1 - Symptomatic but completely ambulatory Blood pressure 102/66, heart rate 83, respiratory rate 18, temperature 36.8, pulse ox 100% on room air GENERAL:alert, no distress and comfortable SKIN: skin color, texture, turgor are normal, no rashes or significant lesions EYES: normal, Conjunctiva are pink and non-injected, sclera clear OROPHARYNX:no exudate, no erythema and lips, buccal mucosa, and tongue normal  NECK: supple, thyroid normal size, non-tender, without nodularity LYMPH:  no palpable lymphadenopathy in the cervical, axillary or inguinal LUNGS: clear to auscultation and percussion with normal breathing effort HEART: regular rate & rhythm and no murmurs and no lower extremity edema ABDOMEN:abdomen soft, non-tender and normal bowel sounds.  No organomegaly.  Rectal exam was deferred today. Musculoskeletal:no cyanosis of digits and no clubbing  NEURO: alert & oriented x 3 with fluent speech, no focal motor/sensory deficits  LABORATORY DATA:  I have reviewed the data as listed CBC Latest Ref Rng & Units 11/06/2017 08/10/2017  05/09/2017  WBC 4.0 - 10.5 K/uL 5.2 5.6 6.4  Hemoglobin 12.0 - 15.0 g/dL 8.3(L) 8.7(L) 9.9(L)  Hematocrit 36.0 - 46.0 % 27.8(L) 26.6(L) 30.1(L)  Platelets 150 - 400 K/uL 485(H) 537(H) 388     CMP Latest Ref Rng & Units 11/06/2017 10/25/2017 08/10/2017  Glucose 70 - 99 mg/dL 151(H) - 157(H)  BUN 8 - 23 mg/dL 11 - 13  Creatinine 0.44 - 1.00 mg/dL 0.95 0.90 1.03(H)  Sodium 135 - 145 mmol/L 134(L) - 136  Potassium 3.5 - 5.1 mmol/L 4.1 - 5.3(H)  Chloride 98 - 111 mmol/L 97(L) - 95(L)  CO2 22 - 32 mmol/L 27 - 25  Calcium 8.9 - 10.3 mg/dL 9.8 - 10.7(H)  Total Protein 6.5 - 8.1 g/dL 7.5 - 7.1  Total Bilirubin 0.3 - 1.2 mg/dL 0.2(L) - <0.2  Alkaline Phos 38 - 126 U/L 81 - 97  AST 15 - 41 U/L 20 - 16  ALT 0 - 44 U/L 10 - 12      RADIOGRAPHIC STUDIES: I have personally reviewed the radiological images as listed and agreed with the findings in the report. Mr Pelvis W Wo Contrast  Result Date: 11/17/2017 CLINICAL DATA:  New diagnosis of rectal cancer. Diarrhea and bloody stool for 2 months. EXAM: MRI PELVIS WITHOUT AND WITH CONTRAST TECHNIQUE: Multiplanar multisequence MR imaging of the pelvis was performed both before and after administration of intravenous contrast. Small amount of [place] gel was administered per rectum to optimize tumor evaluation. CONTRAST:  20m MULTIHANCE GADOBENATE DIMEGLUMINE 529 MG/ML IV SOLN COMPARISON:  CT 10/25/2017.  Colonoscopy report of 10/04/2017. FINDINGS: TUMOR LOCATION Location from Anal Verge: High, 10.7 cm from the anal verge on image 12/2. Shortest Distance from Tumor to Anal Sphincter: 6.9 cm, including on image 13/6. TUMOR DESCRIPTION Circumferential Extent: Centered at the 3 o'clock position, on the order of 3.6 x 2.8 cm, including on image 9/9 and image 8/5. Craniocaudal Extent:  4.7 cm on image 11/2. T - CATEGORY Extension through Muscularis Propria:  Yes = T3 Maximum extension beyond Muscularis Propria: On the order of 8-9 mm, including on image 10/9. ( advanced  T3 = >564m Extramural vascular invasion/tumor thrombus:  No  Shortest distance of any tumor/node from Mesorectal Fascia:  11 mm Invasion of Anterior Peritoneal Reflection:  No Involvement of Adjacent Organs or Pelvic Sidewall Structures:  No N - CATEGORY Mesorectal Lymph Nodes >=81m: Present. Example at 7 mm on image 11/5 Extra-mesorectal Lymphadenopathy:  Absent Other: Scattered colonic diverticula. No significant free fluid. Hysterectomy. Pelvic floor laxity. IMPRESSION: Rectal adenocarcinoma T stage:   T3 Rectal adenocarcinoma N stage:  N1 Distance from tumor to the anal sphincter is 6.9 cm. Electronically Signed   By: KAbigail MiyamotoM.D.   On: 11/17/2017 12:17     Pelvic MRI 11/16/17  IMPRESSION: Rectal adenocarcinoma T stage:   T3 Rectal adenocarcinoma N stage:  N1 Distance from tumor to the anal sphincter is 6.9 cm.  ASSESSMENT & PLAN:  67y.o. female with h/o CAD, HTN, HL, DM, and depression with positive cologuard test and rectosigmoid colon mass     1. Adenocarcinoma of the rectosigmoid colon, moderately differentiated, cT3N1M0, stage IIIB, MMR normal  -We previously reviewed her medical record with the patient in detail. She has what appears to be localized rectosigmoid colon cancer, with main tumor in proximal rectum -Her CT scan showed a few small indeterminate lung nodules. I reviewed she has low suspicion the lung nodules represent metastatic disease and will monitor in the future .  -She was seen by Dr. TMarcello Mooresand she had a Pelvic MRI from 11/16/17 which showed her cancer is mostly in the top of rectum and may involve surrounding lymph nodes but not spread elsewhere. This is stage III cancer.  -I discussed standard of care for stage III rectal cancer involves surgery, chemo and radiation.  I discussed the option of neoadjuvant concurrent chemoradiation, followed by surgery then adjuvant chemo, versus total neoadjuvant with chemotherapy FOLFOX every 2 weeks for 4 months, followed by  concurrent chemoradiation and surgery.  Given her node positive disease, I recommend total neoadjuvant and she agrees.   --Chemotherapy consent: Side effects including but does not limited to, fatigue, nausea, vomiting, diarrhea, hair loss, cold sensitivity, neuropathy, fluid retention, renal and kidney dysfunction, neutropenic fever, needed for blood transfusion, bleeding, coronary artery spasm and heart attack, were discussed with patient in great detail. She agreed to proceed. -Before starting chemo, she will take chemo education class and PAC placement by Dr. TMarcello Moores   -F/u on 11/20 for first cycle chemo -she is scheduled to see radiation oncologist Dr. MLisbeth Renshawnext week.  2. Iron deficiency anemia, secondary to #1  -she is symptomatic with fatigue -she reportedly was only on oral iron for 1 month then discontinued because she did not feel benefit -she will likely require IV iron; we explained benefit and potential risk including infusion reaction and anaphylaxis. She agrees to IV Feraheme if needed.  -She is receiving IV iron today (11/17/17) and next week.   3. Depression -she reports long history of depression, she feels hopeless at times. Denies SI/HI.  -she has a son in town and his family, otherwise she lives alone -she does not have mental health provider, not on medication -she agrees to SW referral to talk to someone and learn about community resources, she was previously referred  4. Social Support  -She lives alone in a apartment and takes care of herself.  -She can get help from her son and grandchildren if needed.  -She would need help with more transportation, I will set up SW consult  5. Constipation, hard stool -I recommend she start using stool softeners and laxatives such as Miralax to make it easier for her.    PLAN: -Staging pelvic MRI findings reviewed with patient -I recommended total neoadjuvant therapy, she will start  chemotherapy FOLFOX on 11/20 -port  placement by Dr. Marcello Moores before chemo, I sent a message to Dr. Marcello Moores -chemo class next week  -SW consult for transportation and social support -f/u on 11/20 before first cycle chemo  -She will see Dr. Lisbeth Renshaw next week    No problem-specific Assessment & Plan notes found for this encounter.   No orders of the defined types were placed in this encounter.  All questions were answered. The patient knows to call the clinic with any problems, questions or concerns. No barriers to learning was detected. I spent 30 minutes counseling the patient face to face. The total time spent in the appointment was 40 minutes and more than 50% was on counseling and review of test results     Truitt Merle, MD 11/17/2017 3:33 PM   I, Joslyn Devon, am acting as scribe for Truitt Merle, MD.   I have reviewed the above documentation for accuracy and completeness, and I agree with the above.

## 2017-11-17 NOTE — Telephone Encounter (Signed)
Spoke with [facility] requested stat read on MRI pelvis from yesterday as Dr. Burr Medico is seeing the patient this afternoon.

## 2017-11-17 NOTE — Patient Instructions (Signed)

## 2017-11-17 NOTE — Progress Notes (Signed)
Patient received first time Ferumoxytol (Feraheme) and tolerated this well. Observed for 30 minutes post infusion. Food and drinks provided. Vitals obtained pre and post infusion within normal parameters. AVS provided to patient before discharge.

## 2017-11-18 ENCOUNTER — Encounter: Payer: Self-pay | Admitting: Hematology

## 2017-11-18 MED ORDER — ONDANSETRON HCL 8 MG PO TABS: 8.0000 mg | ORAL_TABLET | Freq: Two times a day (BID) | ORAL | 1 refills | Status: DC | PRN

## 2017-11-18 MED ORDER — LIDOCAINE-PRILOCAINE 2.5-2.5 % EX CREA: TOPICAL_CREAM | CUTANEOUS | 3 refills | Status: DC

## 2017-11-18 MED ORDER — PROCHLORPERAZINE MALEATE 10 MG PO TABS: 10.0000 mg | ORAL_TABLET | Freq: Four times a day (QID) | ORAL | 1 refills | Status: DC | PRN

## 2017-11-18 NOTE — Progress Notes (Signed)
START ON PATHWAY REGIMEN - Colorectal     A cycle is every 14 days:     Oxaliplatin      Leucovorin      5-Fluorouracil      5-Fluorouracil   **Always confirm dose/schedule in your pharmacy ordering system**  Patient Characteristics: Preoperative or Nonsurgical Candidate (Clinical Staging), Rectal, cT3 - cT4, cN0 or Any cT, cN+ Therapeutic Status: Preoperative or Nonsurgical Candidate (Clinical Staging) Tumor Location: Rectal AJCC T Category: cT3 AJCC N Category: cN1 AJCC M Category: cM0 AJCC 8 Stage Grouping: IIIB Intent of Therapy: Curative Intent, Discussed with Patient

## 2017-11-19 ENCOUNTER — Ambulatory Visit: Payer: Self-pay | Admitting: General Surgery

## 2017-11-21 ENCOUNTER — Encounter (HOSPITAL_COMMUNITY): Payer: Self-pay | Admitting: *Deleted

## 2017-11-21 ENCOUNTER — Encounter: Payer: Self-pay | Admitting: General Practice

## 2017-11-21 ENCOUNTER — Other Ambulatory Visit: Payer: Self-pay

## 2017-11-21 NOTE — Progress Notes (Signed)
[facility] CSW Progress Note  Request received from MD to assess for transportation and other needs.  [employer] programmer notified, CSW will see patient during infusion on 11.20.  Edwyna Shell, LCSW Clinical Social Worker Phone:  [phone]

## 2017-11-21 NOTE — Progress Notes (Signed)
Patient was informed at time of preop call to have responsible adult to ride with her home after surgery.  Patient states she is using transportation at [phone].

## 2017-11-22 ENCOUNTER — Other Ambulatory Visit: Payer: Self-pay

## 2017-11-22 ENCOUNTER — Ambulatory Visit (HOSPITAL_COMMUNITY): Payer: Medicare Other | Admitting: Anesthesiology

## 2017-11-22 ENCOUNTER — Ambulatory Visit (HOSPITAL_COMMUNITY): Payer: Medicare Other

## 2017-11-22 ENCOUNTER — Encounter (HOSPITAL_COMMUNITY): Payer: Self-pay

## 2017-11-22 ENCOUNTER — Other Ambulatory Visit: Payer: Self-pay | Admitting: Hematology

## 2017-11-22 ENCOUNTER — Ambulatory Visit (HOSPITAL_COMMUNITY)
Admission: RE | Admit: 2017-11-22 | Discharge: 2017-11-22 | Disposition: A | Discharge status: Home / Self Care | Payer: Medicare Other | Source: Ambulatory Visit | Attending: General Surgery | Admitting: General Surgery

## 2017-11-22 ENCOUNTER — Encounter (HOSPITAL_COMMUNITY)
Admission: RE | Disposition: A | Discharge status: Home / Self Care | Payer: Self-pay | Source: Ambulatory Visit | Attending: General Surgery

## 2017-11-22 DIAGNOSIS — D630 Anemia in neoplastic disease: Secondary | ICD-10-CM | POA: Diagnosis not present

## 2017-11-22 DIAGNOSIS — Z87891 Personal history of nicotine dependence: Secondary | ICD-10-CM | POA: Diagnosis not present

## 2017-11-22 DIAGNOSIS — Z95828 Presence of other vascular implants and grafts: Secondary | ICD-10-CM

## 2017-11-22 DIAGNOSIS — E7800 Pure hypercholesterolemia, unspecified: Secondary | ICD-10-CM | POA: Insufficient documentation

## 2017-11-22 DIAGNOSIS — Z7984 Long term (current) use of oral hypoglycemic drugs: Secondary | ICD-10-CM | POA: Insufficient documentation

## 2017-11-22 DIAGNOSIS — E1151 Type 2 diabetes mellitus with diabetic peripheral angiopathy without gangrene: Secondary | ICD-10-CM | POA: Diagnosis not present

## 2017-11-22 DIAGNOSIS — C20 Malignant neoplasm of rectum: Secondary | ICD-10-CM | POA: Insufficient documentation

## 2017-11-22 DIAGNOSIS — Z79890 Hormone replacement therapy (postmenopausal): Secondary | ICD-10-CM | POA: Diagnosis not present

## 2017-11-22 DIAGNOSIS — Z79899 Other long term (current) drug therapy: Secondary | ICD-10-CM | POA: Diagnosis not present

## 2017-11-22 DIAGNOSIS — E039 Hypothyroidism, unspecified: Secondary | ICD-10-CM | POA: Insufficient documentation

## 2017-11-22 DIAGNOSIS — I10 Essential (primary) hypertension: Secondary | ICD-10-CM | POA: Insufficient documentation

## 2017-11-22 DIAGNOSIS — I2510 Atherosclerotic heart disease of native coronary artery without angina pectoris: Secondary | ICD-10-CM | POA: Insufficient documentation

## 2017-11-22 DIAGNOSIS — E119 Type 2 diabetes mellitus without complications: Secondary | ICD-10-CM | POA: Diagnosis not present

## 2017-11-22 HISTORY — PX: PORTACATH PLACEMENT: SHX2246

## 2017-11-22 HISTORY — DX: Anemia, unspecified: D64.9

## 2017-11-22 LAB — CBC
HCT: 26.8 % — ABNORMAL LOW (ref 36.0–46.0)
Hemoglobin: 7.9 g/dL — ABNORMAL LOW (ref 12.0–15.0)
MCH: 24.2 pg — ABNORMAL LOW (ref 26.0–34.0)
MCHC: 29.5 g/dL — ABNORMAL LOW (ref 30.0–36.0)
MCV: 82 fL (ref 80.0–100.0)
Platelets: 497 10*3/uL — ABNORMAL HIGH (ref 150–400)
RBC: 3.27 MIL/uL — ABNORMAL LOW (ref 3.87–5.11)
RDW: 18 % — ABNORMAL HIGH (ref 11.5–15.5)
WBC: 6.2 10*3/uL (ref 4.0–10.5)
nRBC: 0.3 % — ABNORMAL HIGH (ref 0.0–0.2)

## 2017-11-22 LAB — HEMOGLOBIN A1C
Hgb A1c MFr Bld: 6.3 % — ABNORMAL HIGH (ref 4.8–5.6)
Mean Plasma Glucose: 134.11 mg/dL

## 2017-11-22 LAB — GLUCOSE, CAPILLARY
Glucose-Capillary: 172 mg/dL — ABNORMAL HIGH (ref 70–99)
Glucose-Capillary: 145 mg/dL — ABNORMAL HIGH (ref 70–99)

## 2017-11-22 SURGERY — INSERTION, TUNNELED CENTRAL VENOUS DEVICE, WITH PORT
Anesthesia: General | Site: Chest | Laterality: Right

## 2017-11-22 MED ORDER — LIDOCAINE 2% (20 MG/ML) 5 ML SYRINGE
INTRAMUSCULAR | Status: AC
  Filled 2017-11-22: qty 5

## 2017-11-22 MED ORDER — PROPOFOL 10 MG/ML IV BOLUS
INTRAVENOUS | Status: AC
  Filled 2017-11-22: qty 20

## 2017-11-22 MED ORDER — OXYCODONE HCL 5 MG PO TABS: 5.0000 mg | ORAL_TABLET | ORAL | Status: DC | PRN

## 2017-11-22 MED ORDER — LIDOCAINE 2% (20 MG/ML) 5 ML SYRINGE
INTRAMUSCULAR | Status: DC | PRN
  Administered 2017-11-22: 60 mg via INTRAVENOUS

## 2017-11-22 MED ORDER — SODIUM CHLORIDE 0.9 % IV SOLN
INTRAVENOUS | Status: DC | PRN
  Administered 2017-11-22: 50 ug/min via INTRAVENOUS

## 2017-11-22 MED ORDER — FENTANYL CITRATE (PF) 100 MCG/2ML IJ SOLN
INTRAMUSCULAR | Status: DC | PRN
  Administered 2017-11-22 (×2): 50 ug via INTRAVENOUS

## 2017-11-22 MED ORDER — SODIUM CHLORIDE 0.9 % IV SOLN
Freq: Once | INTRAVENOUS | Status: AC
  Administered 2017-11-22: 500 mL
  Filled 2017-11-22: qty 1.2

## 2017-11-22 MED ORDER — PROPOFOL 10 MG/ML IV BOLUS
INTRAVENOUS | Status: DC | PRN
  Administered 2017-11-22: 150 mg via INTRAVENOUS

## 2017-11-22 MED ORDER — ACETAMINOPHEN 650 MG RE SUPP
650.0000 mg | RECTAL | Status: DC | PRN
  Filled 2017-11-22: qty 1

## 2017-11-22 MED ORDER — PROMETHAZINE HCL 25 MG/ML IJ SOLN: 6.2500 mg | INTRAMUSCULAR | Status: DC | PRN

## 2017-11-22 MED ORDER — SODIUM CHLORIDE 0.9% FLUSH: 3.0000 mL | INTRAVENOUS | Status: DC | PRN

## 2017-11-22 MED ORDER — 0.9 % SODIUM CHLORIDE (POUR BTL) OPTIME
TOPICAL | Status: DC | PRN
  Administered 2017-11-22: 1000 mL

## 2017-11-22 MED ORDER — FENTANYL CITRATE (PF) 100 MCG/2ML IJ SOLN
INTRAMUSCULAR | Status: AC
  Filled 2017-11-22: qty 2

## 2017-11-22 MED ORDER — HEPARIN SOD (PORK) LOCK FLUSH 100 UNIT/ML IV SOLN
INTRAVENOUS | Status: DC | PRN
  Administered 2017-11-22: 500 [IU] via INTRAVENOUS

## 2017-11-22 MED ORDER — SODIUM CHLORIDE 0.9 % IV SOLN: 250.0000 mL | INTRAVENOUS | Status: DC | PRN

## 2017-11-22 MED ORDER — ACETAMINOPHEN 500 MG PO TABS
1000.0000 mg | ORAL_TABLET | ORAL | Status: AC
  Administered 2017-11-22: 1000 mg via ORAL
  Filled 2017-11-22: qty 2

## 2017-11-22 MED ORDER — CEFAZOLIN SODIUM-DEXTROSE 2-4 GM/100ML-% IV SOLN
2.0000 g | INTRAVENOUS | Status: AC
  Administered 2017-11-22: 2 g via INTRAVENOUS
  Filled 2017-11-22: qty 100

## 2017-11-22 MED ORDER — ONDANSETRON HCL 4 MG/2ML IJ SOLN
INTRAMUSCULAR | Status: AC
  Filled 2017-11-22: qty 2

## 2017-11-22 MED ORDER — DEXAMETHASONE SODIUM PHOSPHATE 10 MG/ML IJ SOLN
INTRAMUSCULAR | Status: DC | PRN
  Administered 2017-11-22: 4 mg via INTRAVENOUS

## 2017-11-22 MED ORDER — BUPIVACAINE-EPINEPHRINE 0.25% -1:200000 IJ SOLN
INTRAMUSCULAR | Status: DC | PRN
  Administered 2017-11-22: 20 mL

## 2017-11-22 MED ORDER — ONDANSETRON HCL 4 MG/2ML IJ SOLN
INTRAMUSCULAR | Status: DC | PRN
  Administered 2017-11-22: 4 mg via INTRAVENOUS

## 2017-11-22 MED ORDER — ACETAMINOPHEN 325 MG PO TABS: 650.0000 mg | ORAL_TABLET | ORAL | Status: DC | PRN

## 2017-11-22 MED ORDER — CEFAZOLIN SODIUM-DEXTROSE 2-3 GM-%(50ML) IV SOLR
INTRAVENOUS | Status: DC | PRN
  Administered 2017-11-22: 2 g via INTRAVENOUS

## 2017-11-22 MED ORDER — LACTATED RINGERS IV SOLN
INTRAVENOUS | Status: DC
  Administered 2017-11-22 (×2): via INTRAVENOUS

## 2017-11-22 MED ORDER — HEPARIN SOD (PORK) LOCK FLUSH 100 UNIT/ML IV SOLN
INTRAVENOUS | Status: AC
  Filled 2017-11-22: qty 5

## 2017-11-22 MED ORDER — FENTANYL CITRATE (PF) 100 MCG/2ML IJ SOLN: 25.0000 ug | INTRAMUSCULAR | Status: DC | PRN

## 2017-11-22 MED ORDER — BUPIVACAINE-EPINEPHRINE (PF) 0.25% -1:200000 IJ SOLN
INTRAMUSCULAR | Status: AC
  Filled 2017-11-22: qty 30

## 2017-11-22 MED ORDER — DEXAMETHASONE SODIUM PHOSPHATE 10 MG/ML IJ SOLN
INTRAMUSCULAR | Status: AC
  Filled 2017-11-22: qty 1

## 2017-11-22 MED ORDER — SODIUM CHLORIDE 0.9% FLUSH: 3.0000 mL | Freq: Two times a day (BID) | INTRAVENOUS | Status: DC

## 2017-11-22 SURGICAL SUPPLY — 33 items
ADH SKN CLS APL DERMABOND .7 (GAUZE/BANDAGES/DRESSINGS) ×1
BAG DECANTER FOR FLEXI CONT (MISCELLANEOUS) ×2 IMPLANT
BLADE SURG SZ11 CARB STEEL (BLADE) ×2 IMPLANT
CHLORAPREP W/TINT 26ML (MISCELLANEOUS) ×2 IMPLANT
COVER WAND RF STERILE (DRAPES) ×1 IMPLANT
DECANTER SPIKE VIAL GLASS SM (MISCELLANEOUS) ×2 IMPLANT
DERMABOND ADVANCED (GAUZE/BANDAGES/DRESSINGS) ×1
DERMABOND ADVANCED .7 DNX12 (GAUZE/BANDAGES/DRESSINGS) ×1 IMPLANT
DRAPE C-ARM 42X120 X-RAY (DRAPES) ×2 IMPLANT
DRAPE LAPAROTOMY TRNSV 102X78 (DRAPE) ×2 IMPLANT
ELECT CAUTERY BLADE 6.4 (BLADE) ×1 IMPLANT
ELECT PENCIL ROCKER SW 15FT (MISCELLANEOUS) ×2 IMPLANT
ELECT REM PT RETURN 15FT ADLT (MISCELLANEOUS) ×2 IMPLANT
GAUZE 4X4 16PLY RFD (DISPOSABLE) ×2 IMPLANT
GLOVE BIO SURGEON STRL SZ 6.5 (GLOVE) ×2 IMPLANT
GLOVE BIOGEL PI IND STRL 7.0 (GLOVE) ×1 IMPLANT
GLOVE BIOGEL PI INDICATOR 7.0 (GLOVE) ×1
GOWN STRL REUS W/TWL 2XL LVL3 (GOWN DISPOSABLE) ×2 IMPLANT
GOWN STRL REUS W/TWL XL LVL3 (GOWN DISPOSABLE) ×2 IMPLANT
KIT BASIN OR (CUSTOM PROCEDURE TRAY) ×2 IMPLANT
KIT PORT POWER 8FR ISP CVUE (Port) ×1 IMPLANT
NDL HYPO 25X1 1.5 SAFETY (NEEDLE) ×1 IMPLANT
NEEDLE HYPO 25X1 1.5 SAFETY (NEEDLE) ×2 IMPLANT
PACK BASIC VI WITH GOWN DISP (CUSTOM PROCEDURE TRAY) ×2 IMPLANT
SUT PROLENE 2 0 SH DA (SUTURE) ×2 IMPLANT
SUT VIC AB 3-0 SH 27 (SUTURE) ×2
SUT VIC AB 3-0 SH 27XBRD (SUTURE) ×1 IMPLANT
SUT VIC AB 4-0 PS2 18 (SUTURE) ×2 IMPLANT
SYR 10ML LL (SYRINGE) ×2 IMPLANT
SYR BULB IRRIGATION 50ML (SYRINGE) ×1 IMPLANT
SYR CONTROL 10ML LL (SYRINGE) ×2 IMPLANT
TOWEL OR 17X26 10 PK STRL BLUE (TOWEL DISPOSABLE) ×2 IMPLANT
TOWEL OR NON WOVEN STRL DISP B (DISPOSABLE) ×2 IMPLANT

## 2017-11-22 NOTE — Anesthesia Preprocedure Evaluation (Signed)
Anesthesia Evaluation  Patient identified by MRN, date of birth, ID band Patient awake    Reviewed: Allergy & Precautions, NPO status , Patient's Chart, lab work & pertinent test results  Airway Mallampati: II  TM Distance: >3 FB Neck ROM: Full    Dental  (+) Dental Advisory Given   Pulmonary former smoker,    breath sounds clear to auscultation       Cardiovascular hypertension, Pt. on medications + Peripheral Vascular Disease   Rhythm:Regular Rate:Normal     Neuro/Psych negative neurological ROS     GI/Hepatic Neg liver ROS, GERD  ,Rectal CA   Endo/Other  diabetes, Type 2, Oral Hypoglycemic AgentsHypothyroidism   Renal/GU negative Renal ROS     Musculoskeletal   Abdominal   Peds  Hematology  (+) anemia ,   Anesthesia Other Findings   Reproductive/Obstetrics                             Lab Results  Component Value Date   WBC 5.2 11/06/2017   HGB 8.3 (L) 11/06/2017   HCT 27.8 (L) 11/06/2017   MCV 78.8 (L) 11/06/2017   PLT 485 (H) 11/06/2017   Lab Results  Component Value Date   CREATININE 0.95 11/06/2017   BUN 11 11/06/2017   NA 134 (L) 11/06/2017   K 4.1 11/06/2017   CL 97 (L) 11/06/2017   CO2 27 11/06/2017    Anesthesia Physical Anesthesia Plan  ASA: III  Anesthesia Plan: General   Post-op Pain Management:    Induction: Intravenous  PONV Risk Score and Plan: 3 and Dexamethasone, Ondansetron, Treatment may vary due to age or medical condition and Scopolamine patch - Pre-op  Airway Management Planned: LMA  Additional Equipment:   Intra-op Plan:   Post-operative Plan: Extubation in OR  Informed Consent: I have reviewed the patients History and Physical, chart, labs and discussed the procedure including the risks, benefits and alternatives for the proposed anesthesia with the patient or authorized representative who has indicated his/her understanding and  acceptance.   Dental advisory given  Plan Discussed with: CRNA  Anesthesia Plan Comments:         Anesthesia Quick Evaluation

## 2017-11-22 NOTE — Progress Notes (Signed)
11/15/17 Tumor Board: Plan: Neoadjuvant chemotherapy

## 2017-11-22 NOTE — H&P (Signed)
The patient is a 67 year old female who presents with colorectal cancer. 67 year old female with rectal bleeding who presents to the office with a newly diagnosed rectosigmoid cancer. She underwent a cold guard test in July 2019 which was positive. She underwent a colonoscopy with Dr. Collene Mares in September 2019 which showed a rectosigmoid mass. It does not appear that it was tattooed. This showed a moderately differentiated adenocarcinoma. CT scans of the chest abdomen and pelvis were performed. These showed some small lung nodules and a mass that appears to be in her proximal rectum. There is some pericolonic lymph nodes noted as well. CEA level was elevated at 55. She reports no further rectal bleeding. She is having some diarrhea. She denies any weight loss. Surgical history is significant for hysterectomy and umbilical hernia repair. Patient has coronary artery disease but has stopped her Plavix. Her cardiologist is Dr. Alvester Chou. She is a former smoker and has a peripheral artery stent in her leg.   Past Surgical History (Tanisha A. Owens Shark, [place]; 11/13/2017 9:46 AM) Hysterectomy (not due to cancer) - Complete   Diagnostic Studies History (Tanisha A. Owens Shark, [place]; 11/13/2017 9:46 AM) Colonoscopy  within last year Pap Smear  1-5 years ago  Allergies (Tanisha A. Owens Shark, [place]; 11/13/2017 9:47 AM) Lisinopril *ANTIHYPERTENSIVES*  Allergies Reconciled   Medication History (Tanisha A. Owens Shark, [place]; 11/13/2017 9:49 AM) Glimepiride (1MG  Tablet, Oral) Active. Levothyroxine Sodium (50MCG Tablet, Oral) Active. metFORMIN HCl (500MG  Tablet, Oral) Active. Triamterene-HCTZ (75-50MG  Tablet, Oral) Active. amLODIPine Besylate (5MG  Tablet, Oral) Active. Atorvastatin Calcium (10MG  Tablet, Oral) Active. Clopidogrel Bisulfate (75MG  Tablet, Oral) Active. Gabapentin (300MG  Capsule, Oral) Active. Medications Reconciled  Social History (Tanisha A. Owens Shark, [place]; 11/13/2017 9:46 AM) Caffeine use   Coffee. No drug use  Tobacco use  Former smoker.  Family History (Tanisha A. Owens Shark, RMA; 11/13/2017 9:46 AM) Cancer  Father. Hypertension  Brother, Mother.  Pregnancy / Birth History (Tanisha A. Owens Shark, RMA; 11/13/2017 9:46 AM) Age at menarche  89 years. Age of menopause  96-50 Length (months) of breastfeeding  3-6 Maternal age  76-20 Para  1  Other Problems (Tanisha A. Owens Shark, [place]; 11/13/2017 9:46 AM) Arthritis  Back Pain  Depression  Diabetes Mellitus  Gastroesophageal Reflux Disease  High blood pressure  Hypercholesterolemia  Oophorectomy  Bilateral. Rectal Cancer  Thyroid Disease  Vascular Disease     Review of Systems (Tanisha A. Brown RMA; 11/13/2017 9:46 AM) General Present- Appetite Loss and Fatigue. Not Present- Chills, Fever, Night Sweats, Weight Gain and Weight Loss. HEENT Present- Visual Disturbances and Wears glasses/contact lenses. Not Present- Earache, Hearing Loss, Hoarseness, Nose Bleed, Oral Ulcers, Ringing in the Ears, Seasonal Allergies, Sinus Pain, Sore Throat and Yellow Eyes. Respiratory Present- Chronic Cough. Not Present- Bloody sputum, Difficulty Breathing, Snoring and Wheezing. Cardiovascular Present- Leg Cramps and Swelling of Extremities. Not Present- Chest Pain, Difficulty Breathing Lying Down, Palpitations, Rapid Heart Rate and Shortness of Breath. Gastrointestinal Present- Bloating, Chronic diarrhea, Excessive gas and Gets full quickly at meals. Not Present- Abdominal Pain, Bloody Stool, Change in Bowel Habits, Constipation, Difficulty Swallowing, Hemorrhoids, Indigestion, Nausea, Rectal Pain and Vomiting. Musculoskeletal Present- Joint Pain. Not Present- Back Pain, Joint Stiffness, Muscle Pain, Muscle Weakness and Swelling of Extremities. Neurological Present- Decreased Memory, Numbness, Trouble walking and Weakness. Not Present- Fainting, Headaches, Seizures, Tingling and Tremor. Psychiatric Present- Depression. Not Present-  Anxiety, Bipolar, Change in Sleep Pattern, Fearful and Frequent crying. Hematology Present- Blood Thinners and Gland problems. Not Present- Easy Bruising, Excessive bleeding, HIV and Persistent Infections.  Vitals (Tanisha A. Brown RMA; 11/13/2017 9:47 AM) 11/13/2017 9:46 AM Weight: 170.2 lb Height: 67in Body Surface Area: 1.89 m Body Mass Index: 26.66 kg/m  Temp.: 98.48F  Pulse: 87 (Regular)        Physical Exam Leighton Ruff MD; 54/06/5033 10:02 AM) General Mental Status-Alert. General Appearance-Not in acute distress. Build & Nutrition-Well nourished. Posture-Normal posture. Gait-Normal.  Head and Neck Head-normocephalic, atraumatic with no lesions or palpable masses. Trachea-midline.  Chest and Lung Exam Chest and lung exam reveals -on auscultation, normal breath sounds, no adventitious sounds and normal vocal resonance.  Cardiovascular Cardiovascular examination reveals -normal heart sounds, regular rate and rhythm with no murmurs and no digital clubbing, cyanosis, edema, increased warmth or tenderness.  Abdomen Inspection Inspection of the abdomen reveals - No Hernias. Palpation/Percussion Palpation and Percussion of the abdomen reveal - Soft, Non Tender, No Rigidity (guarding), No hepatosplenomegaly and No Palpable abdominal masses.  Rectal Anorectal Exam External - normal sphincter . Internal - Note: Tumor palpated approximately 8 cm from anal verge.  Neurologic Neurologic evaluation reveals -alert and oriented x 3 with no impairment of recent or remote memory, normal attention span and ability to concentrate, normal sensation and normal coordination.  Musculoskeletal Normal Exam - Bilateral-Upper Extremity Strength Normal and Lower Extremity Strength Normal.    Assessment & Plan Leighton Ruff MD; 46/05/6810 9:58 AM) RECTAL CANCER (C20) Impression: 67 year old female who presents to the office for evaluation of a newly  diagnosed rectal cancer. On physical exam this can be palpated at approximately 8-10 cm from anal verge. I have recommended that she undergo chemotherapy and radiation neoadjuvant lead and we will perform proctectomy with probable diverting loop ileostomy after that.  Pelvis MRI shows T3N1 rectal cancer.  She is set to start chemo on 11/20.  She is here today for port placement.  Risks include bleeding, infection, damage to adjacent structures, device malfunction, need for additional procedures and ptx.  I believe she understands this and wished to proceed.

## 2017-11-22 NOTE — Transfer of Care (Signed)
Immediate Anesthesia Transfer of Care Note  Patient: Nicole Sellers  Procedure(s) Performed: INSERTION PORT-A-CATH (Right Chest)  Patient Location: PACU  Anesthesia Type:General  Level of Consciousness: awake, alert , oriented and patient cooperative  Airway & Oxygen Therapy: Patient Spontanous Breathing and Patient connected to face mask oxygen  Post-op Assessment: Report given to RN, Post -op Vital signs reviewed and stable and Patient moving all extremities X 4  Post vital signs: Reviewed and stable  Last Vitals:  Vitals Value Taken Time  BP 175/66 11/22/2017  4:45 PM  Temp    Pulse 80 11/22/2017  4:46 PM  Resp 17 11/22/2017  4:46 PM  SpO2 100 % 11/22/2017  4:46 PM  Vitals shown include unvalidated device data.  Last Pain:  Vitals:   11/22/17 0924  TempSrc:   PainSc: 0-No pain         Complications: No apparent anesthesia complications

## 2017-11-22 NOTE — Op Note (Signed)
11/22/2017  4:39 PM  PATIENT:  Nicole Sellers  67 y.o. female  Patient Care Team: Reynold Bowen, MD as PCP - General (Endocrinology) Lorretta Harp, MD as PCP - Cardiology (Cardiology)  PRE-OPERATIVE DIAGNOSIS:  Rectal cancer  POST-OPERATIVE DIAGNOSIS:  Rectal cancer  PROCEDURE: INSERTION PORT-A-CATH  SURGEON:  Surgeon(s): Leighton Ruff, MD  ANESTHESIA:   local and general  EBL: 33ml Total I/O In: 1000 [I.V.:1000] Out: 5 [Blood:5]  DISPOSITION OF SPECIMEN:  N/A  COUNTS:  YES  PLAN OF CARE: Discharge to home after PACU  PATIENT DISPOSITION:  PACU - hemodynamically stable.  INDICATION: Patient with need for IV chemotherapy. Port-A-Cath placement was requested.   Use of a central venous catheter for intravenous therapy was discussed. Technique of catheter placement using ultrasound and fluoroscopy guidance was discussed. Risks such as bleeding, infection, pneumothorax, catheter occlusion, reoperation, and other risks were discussed. I noted a good likelihood this will help address the problem. Questions were answered. The patient expressed understanding & wishes to proceed.   Findings: Normal-appearing anatomy.   8 [place] power port. It goes through the right internal jugular vein   Procedure: Informed consent was confirmed. Patient was brought the operating room and positioned supine. Arms were tucked. The patient underwent deep sedation. Neck and chest were clipped and prepped and draped in a sterile fashion. A surgical timeout confirmed our plan. I placed a field block of local anesthesia on the chest.  I entered into the right internal jugular vein on the second venipuncture using US guidance. Non-pulsatile blood was returned. Wire was easily passed into the inferior vena cava and confirmed by fluoroscopy. I confirmed placement of the wire in the right side of the chest.  I made an incision in the lateral infraclavicular area and made a subcutaneous pocket. I used a  dilator on the wire using Seldinger technique to dilate the tract under fluoroscopy. I placed the catheter into the sheath. I then peeled away the dilator sheath. I tunneled the power port from the puncture site to the chest pocket. I cut the catheter to appropriate length and attached it to the port using the plastic connector. The port was placed into the pocket and secured to the left anterior chest wall using 2-0 Prolene interrupted stitches x2. Catheter flushed well.  Fluoroscopy confirmed the tip in the distal SVC. Catheter aspirated and flushed well. On final fluoro reevaluation the tip seen to be in good position in the distal SVC.  I closed the wounds using 3-0 Vicryl interrupted sutures for the pocket and 4-0 Vicryl stitch was used to close the skin. Dermabond was used on the 2 incisions. CXR will be performed in PACU. Patient should go home later today. Catheter is okay to use.

## 2017-11-22 NOTE — Progress Notes (Signed)
GI Location of Tumor / Histology: Rectal Cancer  Nicole Sellers presented to the GI Dr. Collene Mares 07/2017 with complaints of 2 year history of diarrhea, abdominal pain, gas, and bloating.  She noted large amounts of rectal bleeding and fatigue in August.  CT CAP 10/25/2017: irregular eccentric mass within the sigmoid colon.  There are at least 3 abnormal appearing lymph node adjacent to the sigmoid colon at the level of the mass concerning for the possibility of local nodal metastatic disease.  Multiple pulmonary nodules.  Colonoscopy 10/04/2017: large, non-obstructing horse shoe shaped mass was found in the recto-sigmoid colon at 10 cm; the mass was partially circumferential involving on half of the lumen circumference, the mass measured 7 cm in length.  Cologuard Test 08/01/2017: positive  Biopsies of  Rectum 10/04/2017   Past/Anticipated interventions by surgeon, if any:  Dr. Marcello Moores 11/22/2017 -On physical exam this can be palpated at approximately 8-10 cm from anal verge. -I have recommended that she undergo chemotherapy and radiation neoadjuvant lead and we will perform proctectomy with probable diverting loop ileostomy after that.   -She is here today for port placement.  Past/Anticipated interventions by medical oncology, if any:  Dr. Burr Medico 11/17/2017 -Getting IV iron -She notes her bowel movements are non-bloody and loose, 1-2 times a day -She appears to have localized rectosigmoid colon cancer with the main tumor in the proximal rectum. -Her CT scan showed a few small indeterminate lung nodules.  I reviewed, she has low suspicion the lung nodules represent metastatic disease and will monitor in the future. -I discussed standard of care for stage III, chemo and radiation.   -I discussed the option of neoadjuvant concurrent chemoradiation, followed by surgery then adjuvant chemo, versus total neoadjuvant with chemotherapy FOLFOX every 2 weeks for 4 months followed by concurrent chemoradiation  and surgery.  Given her nodal positive disease. -She is scheduled to see radiation oncologist Dr. Lisbeth Renshaw next week. PLAN -Staging pelvic MRI -I recommended total neoadjuvant therapy, she will start chemotherapy FOLFOX on 11/20. -port placement by Dr. Marcello Moores before chemo.   Weight changes, if any: No  Bowel/Bladder complaints, if any: Diarrhea a couple months ago.  Nausea / Vomiting, if any: No  Pain issues, if any:  No  Any blood per rectum: No  BP 107/84 (BP Location: Left Arm, Patient Position: Sitting)   Pulse 91   Temp 98.3 F (36.8 C) (Oral)   Resp 18   Ht 5' 6.5" (1.689 m)   Wt 168 lb (76.2 kg)   SpO2 100%   BMI 26.71 kg/m    Wt Readings from Last 3 Encounters:  11/23/17 168 lb (76.2 kg)  11/22/17 167 lb 2 oz (75.8 kg)  11/06/17 169 lb 12.8 oz (77 kg)   SAFETY ISSUES:  Prior radiation? No  Pacemaker/ICD? No  Possible current pregnancy? Hysterectomy  Is the patient on methotrexate? No  Current Complaints/Details:

## 2017-11-22 NOTE — Discharge Instructions (Addendum)
    PORT-A-CATH: POST OP INSTRUCTIONS  Always review your discharge instruction sheet given to you by the facility where your surgery was performed.   1. A prescription for pain medication may be given to you upon discharge. Take your pain medication as prescribed, if needed. If narcotic pain medicine is not needed, then you make take acetaminophen (Tylenol) or ibuprofen (Advil) as needed.  2. Take your usually prescribed medications unless otherwise directed. 3. If you need a refill on your pain medication, please contact our office. All narcotic pain medicine now requires a paper prescription.  Phoned in and fax refills are no longer allowed by law.  Prescriptions will not be filled after 5 pm or on weekends.  4. You should follow a light diet for the remainder of the day after your procedure. 5. Most patients will experience some mild swelling and/or bruising in the area of the incision. It may take several days to resolve. 6. It is common to experience some constipation if taking pain medication after surgery. Increasing fluid intake and taking a stool softener (such as Colace) will usually help or prevent this problem from occurring. A mild laxative (Milk of Magnesia or Miralax) should be taken according to package directions if there are no bowel movements after 48 hours.  7. Unless discharge instructions indicate otherwise, you may remove your bandages 48 hours after surgery, and you may shower at that time. You may have steri-strips (small white skin tapes) in place directly over the incision.  These strips should be left on the skin for 7-10 days.  If your surgeon used Dermabond (skin glue) on the incision, you may shower in 24 hours.  The glue will flake off over the next 2-3 weeks.  8. If your port is left accessed at the end of surgery (needle left in port), the dressing cannot get wet and should only by changed by a healthcare professional. When the port is no longer accessed (when the  needle has been removed), follow step 7.   9. ACTIVITIES:  Limit activity involving your arms for the next 72 hours. Do no strenuous exercise or activity for 1 week. You may drive when you are no longer taking prescription pain medication, you can comfortably wear a seatbelt, and you can maneuver your car. 10.You may need to see your doctor in the office for a follow-up appointment.  Please       check with your doctor.  11.When you receive a new Port-a-Cath, you will get a product guide and        ID card.  Please keep them in case you need them.  WHEN TO CALL YOUR DOCTOR ([phone]): 1. Fever over 101.0 2. Chills 3. Continued bleeding from incision 4. Increased redness and tenderness at the site 5. Shortness of breath, difficulty breathing   The clinic staff is available to answer your questions during regular business hours. Please don't hesitate to call and ask to speak to one of the nurses or medical assistants for clinical concerns. If you have a medical emergency, go to the nearest emergency room or call 911.  A surgeon from [facility] is always on call at the hospital.     For further information, please visit [email or web address]      

## 2017-11-22 NOTE — Anesthesia Procedure Notes (Signed)
Procedure Name: LMA Insertion Date/Time: 11/22/2017 3:54 PM Performed by: Mitzie Na, CRNA Pre-anesthesia Checklist: Patient identified, Emergency Drugs available, Suction available, Patient being monitored and Timeout performed Patient Re-evaluated:Patient Re-evaluated prior to induction Oxygen Delivery Method: Circle system utilized Preoxygenation: Pre-oxygenation with 100% oxygen Induction Type: IV induction LMA: LMA inserted LMA Size: 3.0 Number of attempts: 1 Placement Confirmation: positive ETCO2 and breath sounds checked- equal and bilateral Tube secured with: Tape Dental Injury: Teeth and Oropharynx as per pre-operative assessment

## 2017-11-22 NOTE — Anesthesia Postprocedure Evaluation (Signed)
Anesthesia Post Note  Patient: Nicole Sellers  Procedure(s) Performed: INSERTION PORT-A-CATH (Right Chest)     Patient location during evaluation: PACU Anesthesia Type: General Level of consciousness: awake and alert Pain management: pain level controlled Vital Signs Assessment: post-procedure vital signs reviewed and stable Respiratory status: spontaneous breathing, nonlabored ventilation, respiratory function stable and patient connected to nasal cannula oxygen Cardiovascular status: blood pressure returned to baseline and stable Postop Assessment: no apparent nausea or vomiting Anesthetic complications: no    Last Vitals:  Vitals:   11/22/17 1730 11/22/17 1743  BP: (!) 151/64 (!) 177/73  Pulse: 76 79  Resp: 15 16  Temp: 36.9 C 36.6 C  SpO2: 95% 97%    Last Pain:  Vitals:   11/22/17 1743  TempSrc: Oral  PainSc: 0-No pain                 Barnet Glasgow

## 2017-11-23 ENCOUNTER — Telehealth: Payer: Self-pay | Admitting: Hematology

## 2017-11-23 ENCOUNTER — Ambulatory Visit
Admission: RE | Admit: 2017-11-23 | Discharge: 2017-11-23 | Disposition: A | Discharge status: Home / Self Care | Payer: Medicare Other | Source: Ambulatory Visit | Attending: Radiation Oncology | Admitting: Radiation Oncology

## 2017-11-23 ENCOUNTER — Ambulatory Visit: Payer: Medicare Other

## 2017-11-23 ENCOUNTER — Encounter (HOSPITAL_COMMUNITY): Payer: Self-pay | Admitting: General Surgery

## 2017-11-23 ENCOUNTER — Other Ambulatory Visit: Payer: Medicare Other

## 2017-11-23 ENCOUNTER — Other Ambulatory Visit: Payer: Self-pay

## 2017-11-23 ENCOUNTER — Telehealth: Payer: Self-pay | Admitting: *Deleted

## 2017-11-23 VITALS — BP 107/84 | HR 91 | Temp 98.3°F | Resp 18 | Ht 66.5 in | Wt 168.0 lb

## 2017-11-23 DIAGNOSIS — Z7984 Long term (current) use of oral hypoglycemic drugs: Secondary | ICD-10-CM | POA: Insufficient documentation

## 2017-11-23 DIAGNOSIS — C20 Malignant neoplasm of rectum: Secondary | ICD-10-CM | POA: Insufficient documentation

## 2017-11-23 DIAGNOSIS — Z7982 Long term (current) use of aspirin: Secondary | ICD-10-CM | POA: Insufficient documentation

## 2017-11-23 DIAGNOSIS — Z85038 Personal history of other malignant neoplasm of large intestine: Secondary | ICD-10-CM | POA: Diagnosis not present

## 2017-11-23 DIAGNOSIS — Z79899 Other long term (current) drug therapy: Secondary | ICD-10-CM | POA: Diagnosis not present

## 2017-11-23 DIAGNOSIS — Z87891 Personal history of nicotine dependence: Secondary | ICD-10-CM | POA: Diagnosis not present

## 2017-11-23 DIAGNOSIS — D500 Iron deficiency anemia secondary to blood loss (chronic): Secondary | ICD-10-CM | POA: Insufficient documentation

## 2017-11-23 NOTE — Progress Notes (Signed)
Radiation Oncology         [phone] ________________________________  Name: Nicole Sellers        MRN: [id number]  Date of Service: 11/23/2017 DOB: [birth year]  KX:FGHWE, Nicole Main, MD  Truitt Merle, MD     REFERRING PHYSICIAN: Truitt Merle, MD   DIAGNOSIS: The encounter diagnosis was Rectal cancer [facility]).   HISTORY OF PRESENT ILLNESS: Nicole Sellers is a 67 y.o. female seen at the request of Dr. Burr Medico for a newly diagnosed rectal cancer. The patient had been experiencing rectal bleeding and diarrhea. She had a cologuard test taht was positive and a colonoscopy on 10/04/17 revealed a horse-shoe shaped tumor about 10 cm from the anal verge. She had a biopsy that revealed an invasive, moderately differentiated adenocarcinoma. A CT C/A/P on 10/26/17 revealed an eccentric mass in the sigmoid colon and some adenopathy. She also had small pulmonary nodules that were indeterminate. Her CEA was 55.85 on 11/06/17. She has also undergone MRI of the pelvis on 11/16/17 that revealed T3N1 disease. She is planning to begin total neoadjuvant chemotherapy next Wednesday, followed by chemoRT, and surgical resection. She comes today to discuss options of treatment for her cancer.   PREVIOUS RADIATION THERAPY: No   PAST MEDICAL HISTORY:  Past Medical History:  Diagnosis Date  . Allergy   . Anemia   . Arthritis    "joints sometimes" (12/25/2012)  . Clotting disorder ([place])   . Colon cancer ([place])   . Critical lower limb ischemia   . Gangrene of toe, Rt second toe 12/25/2012  . GERD (gastroesophageal reflux disease)   . High cholesterol   . Hypertension   . Hypothyroidism   . PAD (peripheral artery disease) ([place])   . S/P arterial stent, 12/25/13, successful diamondback orbital rotational arthrectomy, PTA using chocolate  balloon and stenting using I DEV stent of long segment calcified high-grade proximal and mid r 12/25/2012  . Tobacco abuse   . Type II diabetes mellitus ([place])        PAST SURGICAL  HISTORY: Past Surgical History:  Procedure Laterality Date  . ABDOMINAL AORTAGRAM  12/20/2012   Procedure: ABDOMINAL AORTAGRAM;  Surgeon: Lorretta Harp, MD;  Location: [facility] C[facility] LAB;  Service: Cardiovascular;;  . ABDOMINAL HYSTERECTOMY  2000  . ANGIOPLASTY / STENTING FEMORAL Right 12/25/2012  . ATHERECTOMY Right 12/25/2012   Procedure: ATHERECTOMY;  Surgeon: Lorretta Harp, MD;  Location: [facility] CATH LAB;  Service: Cardiovascular;  Laterality: Right;  right SFA  . HERNIA REPAIR    . LOWER EXTREMITY ANGIOGRAM N/A 12/20/2012   Procedure: LOWER EXTREMITY ANGIOGRAM;  Surgeon: Lorretta Harp, MD;  Location: [facility] CATH LAB;  Service: Cardiovascular;  Laterality: N/A;  . LOWER EXTREMITY ANGIOGRAM N/A 12/25/2012   Procedure: LOWER EXTREMITY ANGIOGRAM;  Surgeon: Lorretta Harp, MD;  Location: [facility] CATH LAB;  Service: Cardiovascular;  Laterality: N/A;  . LOWER EXTREMITY INTERVENTION  05/08/2017  . LOWER EXTREMITY INTERVENTION Bilateral 05/08/2017   Procedure: LOWER EXTREMITY INTERVENTION;  Surgeon: Lorretta Harp, MD;  Location: [place] CV LAB;  Service: Cardiovascular;  Laterality: Bilateral;  . PERIPHERAL VASCULAR BALLOON ANGIOPLASTY Right 05/08/2017   Procedure: PERIPHERAL VASCULAR BALLOON ANGIOPLASTY;  Surgeon: Lorretta Harp, MD;  Location: [place] CV LAB;  Service: Cardiovascular;  Laterality: Right;  sfa  . PERIPHERAL VASCULAR INTERVENTION Right 05/08/2017   Procedure: PERIPHERAL VASCULAR INTERVENTION;  Surgeon: Lorretta Harp, MD;  Location: [place] CV LAB;  Service: Cardiovascular;  Laterality: Right;  ext iliac  .  PORTACATH PLACEMENT Right 11/22/2017   Procedure: INSERTION PORT-A-CATH;  Surgeon: Leighton Ruff, MD;  Location: [facility] ORS;  Service: General;  Laterality: Right;  . TOE SURGERY Right    2n toe   . UMBILICAL HERNIA REPAIR  2000     FAMILY HISTORY:  Family History  Problem Relation Age of Onset  . Hypertension Mother   . Stroke Mother   . Cancer Father         prostate  . Mental illness Sister   . Diabetes Brother   . Hypertension Brother   . Cancer Brother 52       prostate  . Diabetes Brother   . Hypertension Brother   . Cancer Brother        "tumors on neck and body"      SOCIAL HISTORY:  reports that she has quit smoking. Her smoking use included cigarettes. She has a 24.00 pack-year smoking history. She has never used smokeless tobacco. She reports that she does not drink alcohol or use drugs.   ALLERGIES: Kiwi extract; Lisinopril; and Plavix [clopidogrel bisulfate]   MEDICATIONS:  Current Outpatient Medications  Medication Sig Dispense Refill  . acetaminophen (TYLENOL) 500 MG tablet Take 500 mg by mouth 2 (two) times daily as needed for moderate pain or headache.     . albuterol (PROVENTIL HFA;VENTOLIN HFA) 108 (90 BASE) MCG/ACT inhaler Inhale 2 puffs into the lungs every 6 (six) hours as needed for wheezing or shortness of breath.    Marland Kitchen amLODipine (NORVASC) 5 MG tablet Take 1 tablet (5 mg total) by mouth daily. 90 tablet 2  . aspirin 81 MG tablet Take 81 mg by mouth daily.    Marland Kitchen atorvastatin (LIPITOR) 10 MG tablet TAKE 1 TABLET BY MOUTH  EVERY DAY 90 tablet 0  . Calcium Carbonate-Vitamin D (CALCIUM 600+D PO) Take 1 tablet by mouth daily.    . cetirizine (ZYRTEC) 10 MG tablet Take 10 mg by mouth daily.    . cholecalciferol (VITAMIN D) 1000 units tablet Take 4,000 Units by mouth daily.     . clopidogrel (PLAVIX) 75 MG tablet Take 1 tablet (75 mg total) by mouth daily with breakfast. (Patient not taking: Reported on 11/06/2017) 90 tablet 3  . FERREX 150 150 MG capsule Take 150 mg by mouth 2 (two) times daily.  11  . gabapentin (NEURONTIN) 300 MG capsule Take 300 mg by mouth daily.     Marland Kitchen glimepiride (AMARYL) 1 MG tablet Take 1 mg by mouth 2 (two) times daily.    Marland Kitchen levothyroxine (SYNTHROID, LEVOTHROID) 50 MCG tablet TAKE 1 TABLET BY MOUTH  EVERY DAY 90 tablet 0  . lidocaine-prilocaine (EMLA) cream Apply to affected area once 30 g 3  .  metFORMIN (GLUCOPHAGE-XR) 500 MG 24 hr tablet Take 1,000 mg by mouth 2 (two) times daily.    . Misc Natural Products (OSTEO BI-FLEX ADV TRIPLE ST) TABS Take 1 tablet by mouth 2 (two) times daily.    . Multiple Vitamin (MULTIVITAMIN) capsule Take 1 capsule by mouth daily.    . ondansetron (ZOFRAN) 8 MG tablet Take 1 tablet (8 mg total) by mouth 2 (two) times daily as needed for refractory nausea / vomiting. Start on day 3 after chemotherapy. 30 tablet 1  . prochlorperazine (COMPAZINE) 10 MG tablet Take 1 tablet (10 mg total) by mouth every 6 (six) hours as needed (Nausea or vomiting). 30 tablet 1  . tetrahydrozoline-zinc (VISINE-AC) 0.05-0.25 % ophthalmic solution Place 1 drop into both eyes 2 (  two) times daily as needed (dry eyes).    . Triamcinolone Acetonide (NASACORT ALLERGY 24HR NA) Place 1 spray into the nose daily as needed (allergies).    . triamterene-hydrochlorothiazide (MAXZIDE) 75-50 MG tablet Take 1 tablet by mouth daily.    Loura Pardon Salicylate (ASPERCREME EX) Apply 1 application topically daily as needed (joint pain).    . vitamin B-12 (CYANOCOBALAMIN) 500 MCG tablet Take 500 mcg by mouth daily.     No current facility-administered medications for this encounter.      REVIEW OF SYSTEMS: On review of systems, the patient reports that she is doing well overall. She does continue to have some loose stools and rectal bleeding at time. She has declined a blood transfusion and comes back tomorrow for chemo education and iron infusion. She denies any chest pain, shortness of breath, cough, fevers, chills, night sweats, unintended weight changes. She denies any bladder disturbances, and denies abdominal pain, nausea or vomiting. She denies any new musculoskeletal or joint aches or pains. A complete review of systems is obtained and is otherwise negative.     PHYSICAL EXAM:  Wt Readings from Last 3 Encounters:  11/22/17 167 lb 2 oz (75.8 kg)  11/06/17 169 lb 12.8 oz (77 kg)  08/10/17  167 lb 12.8 oz (76.1 kg)   Temp Readings from Last 3 Encounters:  11/22/17 98.1 F (36.7 C)  11/17/17 98.1 F (36.7 C) (Oral)  11/06/17 98.5 F (36.9 C) (Oral)   BP Readings from Last 3 Encounters:  11/22/17 (!) 160/79  11/17/17 112/80  11/06/17 (!) 157/66   Pulse Readings from Last 3 Encounters:  11/22/17 86  11/17/17 74  11/06/17 84      In general this is a well appearing African American female in no acute distress. She is alert and oriented x4 and appropriate throughout the examination. HEENT reveals that the patient is normocephalic, atraumatic. EOMs are intact. Cardiopulmonary assessment is negative for acute distress and she exhibits normal effort.     ECOG = 1  0 - Asymptomatic (Fully active, able to carry on all predisease activities without restriction)  1 - Symptomatic but completely ambulatory (Restricted in physically strenuous activity but ambulatory and able to carry out work of a light or sedentary nature. For example, light housework, office work)  2 - Symptomatic, <50% in bed during the day (Ambulatory and capable of all self care but unable to carry out any work activities. Up and about more than 50% of waking hours)  3 - Symptomatic, >50% in bed, but not bedbound (Capable of only limited self-care, confined to bed or chair 50% or more of waking hours)  4 - Bedbound (Completely disabled. Cannot carry on any self-care. Totally confined to bed or chair)  5 - Death   Eustace Pen MM, Creech RH, Tormey DC, et al. [phone]). "Toxicity and response criteria of the [facility]". [place] Oncol. 5 (6): 649-55    LABORATORY DATA:  Lab Results  Component Value Date   WBC 6.2 11/22/2017   HGB 7.9 (L) 11/22/2017   HCT 26.8 (L) 11/22/2017   MCV 82.0 11/22/2017   PLT 497 (H) 11/22/2017   Lab Results  Component Value Date   NA 134 (L) 11/06/2017   K 4.1 11/06/2017   CL 97 (L) 11/06/2017   CO2 27 11/06/2017   Lab Results  Component  Value Date   ALT 10 11/06/2017   AST 20 11/06/2017   ALKPHOS 81 11/06/2017   BILITOT  0.2 (L) 11/06/2017      RADIOGRAPHY: Ct Chest W Contrast  Result Date: 10/26/2017 CLINICAL DATA:  Patient with colonic mass found on colonoscopy 3 months prior. Abdominal distension. EXAM: CT CHEST, ABDOMEN, AND PELVIS WITH CONTRAST TECHNIQUE: Multidetector CT imaging of the chest, abdomen and pelvis was performed following the standard protocol during bolus administration of intravenous contrast. CONTRAST:  119mL OMNIPAQUE IOHEXOL 300 MG/ML  SOLN COMPARISON:  None. FINDINGS: CT CHEST FINDINGS Cardiovascular: Normal heart size. Trace fluid superior pericardial recess. Thoracic aortic vascular calcifications. Coronary arterial vascular calcifications. Mediastinum/Nodes: No enlarged axillary, mediastinal or hilar lymphadenopathy. Normal appearance of the esophagus. Lungs/Pleura: Central airways are patent. 4 mm subpleural left lower lobe nodule (image 89; series 7). 5 mm subpleural left lower lobe nodule (image 83; series 7). 3 mm subpleural left lower lobe nodule (image 84; series 7). 5 mm subpleural left lower lobe nodule (image 35; series 7). No large area pulmonary consolidation. No pleural effusion or pneumothorax. Musculoskeletal: Thoracic spine degenerative changes. No aggressive or acute appearing osseous lesions. CT ABDOMEN PELVIS FINDINGS Hepatobiliary: Liver is normal in size and contour. No focal hepatic lesions identified. Gallbladder is unremarkable. No intrahepatic or extrahepatic biliary ductal dilatation. Pancreas: Unremarkable Spleen: Unremarkable Adrenals/Urinary Tract: Normal adrenal glands. Kidneys enhance symmetrically with contrast. Subcentimeter too small to characterize low-attenuation renal lesions. No hydronephrosis. Urinary bladder is unremarkable. Stomach/Bowel: There is a peripheral 3.8 cm mass involving the left lateral aspect of the sigmoid colon (image 96; series 2) (image 113; series 5). No  evidence for upstream bowel obstruction. Descending and sigmoid colonic diverticulosis without CT evidence for acute diverticulitis. Normal appendix. No evidence for bowel obstruction. No free fluid or free intraperitoneal air. Normal morphology of the stomach. Vascular/Lymphatic: Normal caliber abdominal aorta. Peripheral calcified atherosclerotic plaque. There is a 1.0 cm left pericolonic lymph node adjacent to the sigmoid colon (image 95; series 2). Additional 0.5 cm left pericolonic lymph node (image 99; series 2). Additional 0.9 cm left pericolonic lymph node (image 96; series 5). Reproductive: Status post hysterectomy. Other: None. Musculoskeletal: Lumbar spine degenerative changes. No aggressive or acute appearing osseous lesions. IMPRESSION: 1. Irregular eccentric mass within the sigmoid colon. There are at least 3 abnormal appearing lymph node adjacent to the sigmoid colon at the level of the mass concerning for the possibility of local nodal metastatic disease. 2. Multiple pulmonary nodules as described above. These are indeterminate in etiology. Recommend attention on follow-up. Electronically Signed   By: Lovey Newcomer M.D.   On: 10/26/2017 08:53   Mr Pelvis W SA Contrast  Result Date: 11/17/2017 CLINICAL DATA:  New diagnosis of rectal cancer. Diarrhea and bloody stool for 2 months. EXAM: MRI PELVIS WITHOUT AND WITH CONTRAST TECHNIQUE: Multiplanar multisequence MR imaging of the pelvis was performed both before and after administration of intravenous contrast. Small amount of [place] gel was administered per rectum to optimize tumor evaluation. CONTRAST:  47mL MULTIHANCE GADOBENATE DIMEGLUMINE 529 MG/ML IV SOLN COMPARISON:  CT 10/25/2017.  Colonoscopy report of 10/04/2017. FINDINGS: TUMOR LOCATION Location from Anal Verge: High, 10.7 cm from the anal verge on image 12/2. Shortest Distance from Tumor to Anal Sphincter: 6.9 cm, including on image 13/6. TUMOR DESCRIPTION Circumferential Extent: Centered at the  3 o'clock position, on the order of 3.6 x 2.8 cm, including on image 9/9 and image 8/5. Craniocaudal Extent:  4.7 cm on image 11/2. T - CATEGORY Extension through [employer]:  Yes = T3 Maximum extension beyond [employer]: On the order of 8-9 mm,  including on image 10/9. ( advanced T3 = >1mm) Extramural vascular invasion/tumor thrombus:  No  Shortest distance of any tumor/node from Mesorectal Fascia:  11 mm Invasion of Anterior Peritoneal Reflection:  No Involvement of Adjacent Organs or Pelvic Sidewall Structures:  No N - CATEGORY Mesorectal Lymph Nodes >=74mm: Present. Example at 7 mm on image 11/5 Extra-mesorectal Lymphadenopathy:  Absent Other: Scattered colonic diverticula. No significant free fluid. Hysterectomy. Pelvic floor laxity. IMPRESSION: Rectal adenocarcinoma T stage:   T3 Rectal adenocarcinoma N stage:  N1 Distance from tumor to the anal sphincter is 6.9 cm. Electronically Signed   By: Abigail Miyamoto M.D.   On: 11/17/2017 12:17   Ct Abdomen Pelvis W Contrast  Result Date: 10/26/2017 CLINICAL DATA:  Patient with colonic mass found on colonoscopy 3 months prior. Abdominal distension. EXAM: CT CHEST, ABDOMEN, AND PELVIS WITH CONTRAST TECHNIQUE: Multidetector CT imaging of the chest, abdomen and pelvis was performed following the standard protocol during bolus administration of intravenous contrast. CONTRAST:  151mL OMNIPAQUE IOHEXOL 300 MG/ML  SOLN COMPARISON:  None. FINDINGS: CT CHEST FINDINGS Cardiovascular: Normal heart size. Trace fluid superior pericardial recess. Thoracic aortic vascular calcifications. Coronary arterial vascular calcifications. Mediastinum/Nodes: No enlarged axillary, mediastinal or hilar lymphadenopathy. Normal appearance of the esophagus. Lungs/Pleura: Central airways are patent. 4 mm subpleural left lower lobe nodule (image 89; series 7). 5 mm subpleural left lower lobe nodule (image 83; series 7). 3 mm subpleural left lower lobe nodule (image 84; series 7). 5  mm subpleural left lower lobe nodule (image 35; series 7). No large area pulmonary consolidation. No pleural effusion or pneumothorax. Musculoskeletal: Thoracic spine degenerative changes. No aggressive or acute appearing osseous lesions. CT ABDOMEN PELVIS FINDINGS Hepatobiliary: Liver is normal in size and contour. No focal hepatic lesions identified. Gallbladder is unremarkable. No intrahepatic or extrahepatic biliary ductal dilatation. Pancreas: Unremarkable Spleen: Unremarkable Adrenals/Urinary Tract: Normal adrenal glands. Kidneys enhance symmetrically with contrast. Subcentimeter too small to characterize low-attenuation renal lesions. No hydronephrosis. Urinary bladder is unremarkable. Stomach/Bowel: There is a peripheral 3.8 cm mass involving the left lateral aspect of the sigmoid colon (image 96; series 2) (image 113; series 5). No evidence for upstream bowel obstruction. Descending and sigmoid colonic diverticulosis without CT evidence for acute diverticulitis. Normal appendix. No evidence for bowel obstruction. No free fluid or free intraperitoneal air. Normal morphology of the stomach. Vascular/Lymphatic: Normal caliber abdominal aorta. Peripheral calcified atherosclerotic plaque. There is a 1.0 cm left pericolonic lymph node adjacent to the sigmoid colon (image 95; series 2). Additional 0.5 cm left pericolonic lymph node (image 99; series 2). Additional 0.9 cm left pericolonic lymph node (image 96; series 5). Reproductive: Status post hysterectomy. Other: None. Musculoskeletal: Lumbar spine degenerative changes. No aggressive or acute appearing osseous lesions. IMPRESSION: 1. Irregular eccentric mass within the sigmoid colon. There are at least 3 abnormal appearing lymph node adjacent to the sigmoid colon at the level of the mass concerning for the possibility of local nodal metastatic disease. 2. Multiple pulmonary nodules as described above. These are indeterminate in etiology. Recommend attention on  follow-up. Electronically Signed   By: Lovey Newcomer M.D.   On: 10/26/2017 08:53   Dg Chest Port 1 View  Result Date: 11/22/2017 CLINICAL DATA:  Port-A-Cath placement. EXAM: PORTABLE CHEST 1 VIEW COMPARISON:  Chest x-ray dated 05/02/2017 FINDINGS: Power port has been inserted. The tip is in the superior vena cava above the cavoatrial junction 2 cm below the carina in excellent position. No pneumothorax. Heart size and vascularity are normal.  Lungs are clear. Aortic atherosclerosis. No significant bone abnormality. IMPRESSION: Port-A-Cath appears in good position.  No pneumothorax. Aortic Atherosclerosis (ICD10-I70.0). Electronically Signed   By: Lorriane Shire M.D.   On: 11/22/2017 17:06   Dg C-arm 1-60 Min-no Report  Result Date: 11/22/2017 Fluoroscopy was utilized by the requesting physician.  No radiographic interpretation.   Vas [place] Burnard Bunting With/wo Tbi  Result Date: 11/16/2017 LOWER EXTREMITY DOPPLER STUDY Indications: Peripheral artery disease, and s/p stents. High Risk         Hypertension, hyperlipidemia, Diabetes, past history of Factors:          smoking.  Vascular Interventions: History of right superficial femoral artery stenting of                         the entire vessel in 12/17. S/p chocolate cutting                         balloon angioplasty and drug-eluting balloon angioplasty                         of the right superficial femoral artery, PTA/stent in                         the right external iliac artery 05/08/17. Comparison Study: In 5/19, right lower extremity artery duplex showed >50%                   stenosis in the right external iliac artery. 75-99% stenosis                   in the distal superficial femoral artery to proximal popliteal                   artery distal to the stent. Patient complains of bilateral                   lower extremity pain with walking. Performing Technologist: Guinevere Ferrari RVT  Examination Guidelines: A complete evaluation includes at minimum,  Doppler waveform signals and systolic blood pressure reading at the level of bilateral brachial, anterior tibial, and posterior tibial arteries, when vessel segments are accessible. Bilateral testing is considered an integral part of a complete examination. Photoelectric Plethysmograph (PPG) waveforms and toe systolic pressure readings are included as required and additional duplex testing as needed. Limited examinations for reoccurring indications may be performed as noted.  ABI Findings: +---------+------------------+-----+---------+--------+ Right    Rt Pressure (mmHg)IndexWaveform Comment  +---------+------------------+-----+---------+--------+ Brachial 157                                      +---------+------------------+-----+---------+--------+ ATA      137               0.87 biphasic          +---------+------------------+-----+---------+--------+ PTA      145               0.92 triphasic         +---------+------------------+-----+---------+--------+ PERO     117               0.75 biphasic          +---------+------------------+-----+---------+--------+ Luana Shu  0.66 Normal            +---------+------------------+-----+---------+--------+ +---------+------------------+-----+----------+-------+ Left     Lt Pressure (mmHg)IndexWaveform  Comment +---------+------------------+-----+----------+-------+ Brachial 103                                      +---------+------------------+-----+----------+-------+ ATA      80                0.51 monophasic        +---------+------------------+-----+----------+-------+ PTA      99                0.63 monophasic        +---------+------------------+-----+----------+-------+ PERO     89                0.57 monophasic        +---------+------------------+-----+----------+-------+ Great Toe60                0.38 Abnormal  damped   +---------+------------------+-----+----------+-------+ +-------+-----------+-----------+------------+------------+ ABI/TBIToday's ABIToday's TBIPrevious ABIPrevious TBI +-------+-----------+-----------+------------+------------+ Right  0.92       0.66       0.79        0.54         +-------+-----------+-----------+------------+------------+ Left   0.63       0.38       0.57        0.36         +-------+-----------+-----------+------------+------------+ Right ABIs appear increased compared to prior study on 5/19. Left ABIs appear essentially unchanged compared to prior study on 5/19. See Visceral and Arterial Duplex studies.  Summary: Right: Resting right ankle-brachial index indicates mild right lower extremity arterial disease. The right toe-brachial index is abnormal. Left: Resting left ankle-brachial index indicates moderate left lower extremity arterial disease. The left toe-brachial index is abnormal. PPG tracings appear dampened.  *See table(s) above for measurements and observations.  Suggest follow up study in 12 months. Electronically signed by Carlyle Dolly MD on 11/16/2017 at 1:26:32 PM.    Final    Vas [place] Lower Extremity Arterial Duplex  Result Date: 11/16/2017 LOWER EXTREMITY ARTERIAL DUPLEX STUDY Indications: Peripheral artery disease, and s/p stents. High Risk Factors: Hypertension, hyperlipidemia, Diabetes, past history of                    smoking.  Vascular Interventions: History of right superficial femoral artery stenting of                         the entire vessel in 12/17. S/p chocolate cutting                         balloon angioplasty and drug-eluting balloon angioplasty                         of the right superficial femoral artery, PTA/stent in                         the right external iliac artery 05/08/17. Current ABI:            0.92 on the right and 0.63 on the left. Comparison Study: In 5/19, right lower extremity artery duplex showed >50%  stenosis in the right external iliac artery. 75-99% stenosis                   in the distal superficial femoral artery to proximal popliteal                   artery distal to the stent. Patient complains of bilateral                   lower extremity pain with walking. Performing Technologist: Guinevere Ferrari RVT  Examination Guidelines: A complete evaluation includes B-mode imaging, spectral Doppler, color Doppler, and power Doppler as needed of all accessible portions of each vessel. Bilateral testing is considered an integral part of a complete examination. Limited examinations for reoccurring indications may be performed as noted.  Right Duplex Findings: +----------+--------+-----+---------------+----------+--------------------+           PSV cm/sRatioStenosis       Waveform  Comments             +----------+--------+-----+---------------+----------+--------------------+ CFA Prox  257          50-74% stenosisbiphasic  heterogeneous plaque +----------+--------+-----+---------------+----------+--------------------+ CFA Distal140                         biphasic  heterogeneous plaque +----------+--------+-----+---------------+----------+--------------------+ DFA       114                         biphasic                       +----------+--------+-----+---------------+----------+--------------------+ SFA Prox  146                         biphasic                       +----------+--------+-----+---------------+----------+--------------------+ POP Prox  90                          monophasicheterogeneous plaque +----------+--------+-----+---------------+----------+--------------------+ POP Distal62                                                         +----------+--------+-----+---------------+----------+--------------------+ A focal velocity elevation of was obtained at CFA. Findings are characteristic of 50-74% stenosis.  Right Stent(s):  +---------------+--------+--------+--------+--------------------+ SFA            PSV cm/sStenosisWaveformComments             +---------------+--------+--------+--------+--------------------+ Prox to Stent  157             biphasicheterogeneous plaque +---------------+--------+--------+--------+--------------------+ Proximal Stent 105             biphasicheterogeneous plaque +---------------+--------+--------+--------+--------------------+ Mid Stent      128             biphasicheterogeneous plaque +---------------+--------+--------+--------+--------------------+ Distal Stent   109             biphasic                     +---------------+--------+--------+--------+--------------------+ Distal to [id number]             biphasic                     +---------------+--------+--------+--------+--------------------+  Aorta: +--------+-------+----------+----------+--------+--------+-----+         AP (cm)Trans (cm)PSV (cm/s)WaveformThrombusShape +--------+-------+----------+----------+--------+--------+-----+ Proximal                 130                             +--------+-------+----------+----------+--------+--------+-----+ Mid                      129                             +--------+-------+----------+----------+--------+--------+-----+ Distal                   120                             +--------+-------+----------+----------+--------+--------+-----+  Summary: Right: 50-74% stenosis noted in the common femoral artery. Marked improvement is noted compared to previous study in the distal SFA/proximal popliteal artery. Patent right SFA stent without evidence of restenosis. See Visceral Duplex and Arterial Doppler studies.  See table(s) above for measurements and observations. Suggest follow up study in 12 months. Vascular consult recommended. Electronically signed by Carlyle Dolly MD on 11/16/2017 at 1:26:14 PM.    Final    Vas US  Aorta/ivc/iliacs  Result Date: 11/16/2017 ABDOMINAL AORTA STUDY Indications: PAD, s/p stents Risk Factors: Hypertension, hyperlipidemia, Diabetes, past history of smoking. Vascular Interventions: History of right superficial femoral artery stenting of                         the entire vessel in 12/17. S/p chocolate cutting                         balloon angioplasty and drug-eluting balloon angioplasty                         of the right superficial femoral artery, PTA/stent in                         the right external iliac artery 05/08/17. Limitations: Obesity.  Comparison Study: In 5/19, ABI's were 0.79 on the right and 0.57 on the left.                   Right lower extremity artery duplex showed >50% stenosis in                   the right external iliac artery. 75-99% stenosis in the distal                   superficial femoral artery to proximal popliteal artery distal                   to the stent. Patient complains of bilateral lower extremity                   pain with walking. Today's ABI's were 0.92 on the right and                   0.63 on the left. Performing Technologist: Guinevere Ferrari RVT  Examination Guidelines: A complete evaluation includes B-mode imaging, spectral Doppler, color Doppler, and power Doppler as needed of all accessible  portions of each vessel. Bilateral testing is considered an integral part of a complete examination. Limited examinations for reoccurring indications may be performed as noted.  Abdominal Aorta Findings: +-------------+-------+----------+----------+----------+---------+--------+ Location     AP (cm)Trans (cm)PSV (cm/s)Waveform  Thrombus Comments +-------------+-------+----------+----------+----------+---------+--------+ Proximal                      130                                   +-------------+-------+----------+----------+----------+---------+--------+ Mid                           129       biphasic  calcified          +-------------+-------+----------+----------+----------+---------+--------+ Distal                        120       monophasiccalcified         +-------------+-------+----------+----------+----------+---------+--------+ RT CIA Prox                   151       monophasiccalcified         +-------------+-------+----------+----------+----------+---------+--------+ RT CIA Mid                    265       biphasic  calcified         +-------------+-------+----------+----------+----------+---------+--------+ RT CIA Distal                 186       biphasic                    +-------------+-------+----------+----------+----------+---------+--------+ RT EIA Prox                   223       biphasic                    +-------------+-------+----------+----------+----------+---------+--------+ RT EIA Mid                    141       biphasic                    +-------------+-------+----------+----------+----------+---------+--------+ RT EIA Distal                 456       biphasic  calcified         +-------------+-------+----------+----------+----------+---------+--------+ LT CIA Prox                   271       triphasic calcified         +-------------+-------+----------+----------+----------+---------+--------+ LT CIA Mid                    228       triphasic calcified         +-------------+-------+----------+----------+----------+---------+--------+ LT CIA Distal                 171       monophasic                  +-------------+-------+----------+----------+----------+---------+--------+ LT EIA Prox                   503  monophasic                  +-------------+-------+----------+----------+----------+---------+--------+ LT EIA Distal                 218       monophasiccalcified         +-------------+-------+----------+----------+----------+---------+--------+  Visualization of the Left CIA Distal artery and  Left EIA Proximal artery was limited. Stent struts not visualized in the right external iliac artery. IVC/Iliac Findings: +--------+------+--------+--------+   IVC   PatentThrombusComments +--------+------+--------+--------+ IVC Proxpatent                 +--------+------+--------+--------+  Summary: Stenosis: +--------------------+-------------+---------------+ Location            Stenosis     Stent           +--------------------+-------------+---------------+ Right Common Iliac  >50% stenosis                +--------------------+-------------+---------------+ Left Common Iliac   >50% stenosis                +--------------------+-------------+---------------+ Right External Iliac             50-99% stenosis +--------------------+-------------+---------------+ Left External Iliac >50% stenosis                +--------------------+-------------+---------------+ See Arterial Doppler and Duplex studies.  *See table(s) above for measurements and observations. Suggest follow up study in 12 months. Vascular consult recommended.  Electronically signed by Carlyle Dolly MD on 11/16/2017 at 1:25:14 PM.    Final        IMPRESSION/PLAN: 1. Stage IIIB, [id number] adenocarcinoma of the rectum. Dr. Lisbeth Renshaw discusses the pathology findings and reviews the nature of rectal cancer. We discussed the risks, benefits, short, and long term effects of radiotherapy, and the patient is interested in proceeding. Dr. Lisbeth Renshaw discusses the delivery and logistics of radiotherapy and anticipates a course of 5 1/2 weeks of radiotherapy. We discussed meeting back for follow up in a few months following completion of her total neoadjuvant chemotherapy. She is in agreement and we will review recommendations for treatment at that time.  2. Iron deficiency and chronic blood loss anemia. The patient's hgb was 7.9 and she's been offered transfusion but declined. She's set up for infusion time tomorrow which I believe is  an iron infusion. We will follow this expectantly along with her course  In a visit lasting 45 minutes, greater than 50% of the time was spent face to face discussing her case, and coordinating the patient's care.  The above documentation reflects my direct findings during this shared patient visit. Please see the separate note by Dr. Lisbeth Renshaw on this date for the remainder of the patient's plan of care.    Carola Rhine, PAC

## 2017-11-23 NOTE — Telephone Encounter (Signed)
[employer] 201801 BLOOD SAMPLE COLLECTION TO EVALUATE BIOMARKERS IN SUBJECTSWITH UNTREATED SOLID TUMORS. Called patient to follow up on her interest in the above blood draw study.  Asked patient if she has had time to consider the study and read the consent form. Patient states she has read it and is still undecided.  She agreed to meet with research nurse again tomorrow after her chemotherapy education class to discuss further and to possibly sign consent form if she does decide to participate.  Informed patient we can draw her blood prior to chemotherapy next week if she does want to enroll.  Patient states concerned about giving blood since she is anemic.  Informed her that Dr. Burr Medico ordered lab appointment today to type and cross her for possible blood transfusion tomorrow.  Patient states she does not want a blood transfusion and does not know what it involves.  Explained to patient reason for the transfusion and how it is given. Explained to patient that the IV iron she is getting may take a while to help her anemia and that the chemotherapy can also cause anemia.  The transfusion will improve her anemia a little quicker and place in a better position to get chemotherapy next week. Patient verbalized understanding and would like to speak with Dr. Burr Medico about the transfusion before she agrees to getting one.  Informed patient this research nurse will let Dr. Burr Medico know she wants to talk with her and I look forward to seeing her tomorrow.  Thanked patient for her time today.   Informed Dr. Burr Medico of above and she agreed to try to call patient regarding the transfusion.  Foye Spurling, BSN, RN Clinical Research Nurse 11/23/2017 9:30 AM

## 2017-11-23 NOTE — Telephone Encounter (Signed)
Called regarding 11/14

## 2017-11-24 ENCOUNTER — Encounter: Payer: Self-pay | Admitting: *Deleted

## 2017-11-24 ENCOUNTER — Telehealth: Payer: Self-pay

## 2017-11-24 ENCOUNTER — Inpatient Hospital Stay: Payer: Medicare Other

## 2017-11-24 DIAGNOSIS — C20 Malignant neoplasm of rectum: Secondary | ICD-10-CM

## 2017-11-24 DIAGNOSIS — C19 Malignant neoplasm of rectosigmoid junction: Secondary | ICD-10-CM | POA: Diagnosis not present

## 2017-11-24 MED ORDER — SODIUM CHLORIDE 0.9 % IV SOLN
Freq: Once | INTRAVENOUS | Status: AC
  Administered 2017-11-24: 15:00:00 via INTRAVENOUS
  Filled 2017-11-24: qty 250

## 2017-11-24 MED ORDER — SODIUM CHLORIDE 0.9 % IV SOLN
510.0000 mg | Freq: Once | INTRAVENOUS | Status: AC
  Administered 2017-11-24: 510 mg via INTRAVENOUS
  Filled 2017-11-24: qty 17

## 2017-11-24 NOTE — Telephone Encounter (Signed)
Voicemail left for patient to return call to [facility] regarding scheduled appointment. Patient "arrived"; however, patient not able to be located in cancer center.

## 2017-11-24 NOTE — Progress Notes (Signed)
Spoke with patient briefly for 5 minutes today after her chemotherapy education class.  Discussed this study and the blood draw involved.  Patient did not accept the blood transfusion as offered by Dr. Burr Medico yesterday. Patient is aware she is anemic and she feels it is best to not donate blood for the study.  Agreed with patient this is understandable and thanked her very much for her time and willingness to consider this study.  Dr. Burr Medico notified of patient's decision. Foye Spurling, BSN, RN Clinical Research Nurse 11/24/2017 2:30 PM

## 2017-11-24 NOTE — Patient Instructions (Signed)

## 2017-11-27 ENCOUNTER — Encounter: Payer: Self-pay | Admitting: Pharmacist

## 2017-11-27 ENCOUNTER — Other Ambulatory Visit: Payer: Self-pay | Admitting: Hematology

## 2017-11-27 NOTE — Progress Notes (Signed)
[facility]  Telephone:(336) [phone] Fax:(336) [phone]  Clinic Follow up Note   Patient Care Team: Reynold Bowen, MD as PCP - General (Endocrinology) Lorretta Harp, MD as PCP - Cardiology (Cardiology) 11/28/2017  SUMMARY OF ONCOLOGIC HISTORY: Oncology History   Cancer Staging Rectal cancer [facility]) Staging form: Colon and Rectum, AJCC 8th Edition - Clinical stage from 10/04/2017: Stage IIIB (cT3, cN1, cM0) - Signed by Truitt Merle, MD on 11/18/2017       Rectal cancer ([place])   10/04/2017 Procedure    Colonoscopy per Dr. Collene Mares 10/04/17 shows a large, non-obstructing horse-shoe shaped mass was found in the recto-sigmoid colon at 10 cm; the mass was partially circumferential involving one-half of the lumen circumference. The mass measured seven cm in length    10/04/2017 Initial Biopsy    Rectum, at 10-17 cm, mass biopsy -invasive adenocarcinoma, moderately differentiated  ICH stains for MLH1, MSH2, MSH6, and PMS2 are intact (normal)    10/04/2017 Cancer Staging    Staging form: Colon and Rectum, AJCC 8th Edition - Clinical stage from 10/04/2017: Stage IIIB (cT3, cN1, cM0) - Signed by Truitt Merle, MD on 11/18/2017    10/25/2017 Imaging    CT CAP IMPRESSION: 1. Irregular eccentric mass within the sigmoid colon. There are at least 3 abnormal appearing lymph node adjacent to the sigmoid colon at the level of the mass concerning for the possibility of local nodal metastatic disease. 2. Multiple pulmonary nodules as described above. These are indeterminate in etiology. Recommend attention on follow-up.    11/06/2017 Initial Diagnosis    Adenocarcinoma of colon ([place])    11/16/2017 Imaging    Pelvic MRI 11/16/17  IMPRESSION: Rectal adenocarcinoma T stage:   T3 Rectal adenocarcinoma N stage:  N1 Distance from tumor to the anal sphincter is 6.9 cm.    11/28/2017 -  Chemotherapy    The patient had palonosetron (ALOXI) injection 0.25 mg, 0.25 mg, Intravenous,  Once, 0 of 8  cycles leucovorin 760 mg in dextrose 5 % 250 mL infusion, 400 mg/m2 = 760 mg, Intravenous,  Once, 0 of 8 cycles oxaliplatin (ELOXATIN) 160 mg in dextrose 5 % 500 mL chemo infusion, 85 mg/m2 = 160 mg, Intravenous,  Once, 0 of 8 cycles fluorouracil (ADRUCIL) chemo injection 750 mg, 400 mg/m2 = 750 mg, Intravenous,  Once, 0 of 8 cycles fluorouracil (ADRUCIL) 4,550 mg in sodium chloride 0.9 % 59 mL chemo infusion, 2,400 mg/m2 = 4,550 mg, Intravenous, 1 Day/Dose, 0 of 8 cycles  for chemotherapy treatment.    CURRENT THERAPY: PENDING total neoadjuvant chemo FOLFOX q2 weeks x4 months, starting 11/20, followed by concurrent chemoRT   INTERVAL HISTORY: Ms. Krider returns for follow up as scheduled. She was last seen by Dr. Burr Medico on 11/8. Since last visit she underwent PAC placement per Dr. Marcello Moores on 11/13 and met with Rad onc. She attended chemo education class and picked up meds from pharmacy. She received 2 doses IV Feraheme, which she tolerated well. She continues to report intermittent diarrhea. She has not had BM today but had 4 episodes yesterday. She is not maximizing imodium. Appetite is fair, she is drinking less water lately. Denies n/v. Denies rectal pain or bleeding. She has pre-existing diabetic neuropathy in her feet, with burning sensation at night; no numbness or tingling. Does not affect gait or balance. Takes gabapentin. No neuropathy in hands. She has a chronic dry cough, denies chest pain or dyspnea.    MEDICAL HISTORY:  Past Medical History:  Diagnosis  Date  . Allergy   . Anemia   . Arthritis    "joints sometimes" (12/25/2012)  . Clotting disorder ([place])   . Colon cancer ([place])   . Critical lower limb ischemia   . Gangrene of toe, Rt second toe 12/25/2012  . GERD (gastroesophageal reflux disease)   . High cholesterol   . Hypertension   . Hypothyroidism   . PAD (peripheral artery disease) ([place])   . S/P arterial stent, 12/25/13, successful diamondback orbital rotational  arthrectomy, PTA using chocolate  balloon and stenting using I DEV stent of long segment calcified high-grade proximal and mid r 12/25/2012  . Tobacco abuse   . Type II diabetes mellitus ([place])     SURGICAL HISTORY: Past Surgical History:  Procedure Laterality Date  . ABDOMINAL AORTAGRAM  12/20/2012   Procedure: ABDOMINAL AORTAGRAM;  Surgeon: Lorretta Harp, MD;  Location: [facility] CATH LAB;  Service: Cardiovascular;;  . ABDOMINAL HYSTERECTOMY  2000  . ANGIOPLASTY / STENTING FEMORAL Right 12/25/2012  . ATHERECTOMY Right 12/25/2012   Procedure: ATHERECTOMY;  Surgeon: Lorretta Harp, MD;  Location: [facility] CATH LAB;  Service: Cardiovascular;  Laterality: Right;  right SFA  . HERNIA REPAIR    . LOWER EXTREMITY ANGIOGRAM N/A 12/20/2012   Procedure: LOWER EXTREMITY ANGIOGRAM;  Surgeon: Lorretta Harp, MD;  Location: [facility] CATH LAB;  Service: Cardiovascular;  Laterality: N/A;  . LOWER EXTREMITY ANGIOGRAM N/A 12/25/2012   Procedure: LOWER EXTREMITY ANGIOGRAM;  Surgeon: Lorretta Harp, MD;  Location: [facility] CATH LAB;  Service: Cardiovascular;  Laterality: N/A;  . LOWER EXTREMITY INTERVENTION  05/08/2017  . LOWER EXTREMITY INTERVENTION Bilateral 05/08/2017   Procedure: LOWER EXTREMITY INTERVENTION;  Surgeon: Lorretta Harp, MD;  Location: [place] CV LAB;  Service: Cardiovascular;  Laterality: Bilateral;  . PERIPHERAL VASCULAR BALLOON ANGIOPLASTY Right 05/08/2017   Procedure: PERIPHERAL VASCULAR BALLOON ANGIOPLASTY;  Surgeon: Lorretta Harp, MD;  Location: [place] CV LAB;  Service: Cardiovascular;  Laterality: Right;  sfa  . PERIPHERAL VASCULAR INTERVENTION Right 05/08/2017   Procedure: PERIPHERAL VASCULAR INTERVENTION;  Surgeon: Lorretta Harp, MD;  Location: [place] CV LAB;  Service: Cardiovascular;  Laterality: Right;  ext iliac  . PORTACATH PLACEMENT Right 11/22/2017   Procedure: INSERTION PORT-A-CATH;  Surgeon: Leighton Ruff, MD;  Location: [facility] ORS;  Service: General;  Laterality: Right;   . TOE SURGERY Right    2n toe   . UMBILICAL HERNIA REPAIR  2000    I have reviewed the social history and family history with the patient and they are unchanged from previous note.  ALLERGIES:  is allergic to kiwi extract; lisinopril; and plavix [clopidogrel bisulfate].  MEDICATIONS:  Current Outpatient Medications  Medication Sig Dispense Refill  . acetaminophen (TYLENOL) 500 MG tablet Take 500 mg by mouth 2 (two) times daily as needed for moderate pain or headache.     . albuterol (PROVENTIL HFA;VENTOLIN HFA) 108 (90 BASE) MCG/ACT inhaler Inhale 2 puffs into the lungs every 6 (six) hours as needed for wheezing or shortness of breath.    Marland Kitchen amLODipine (NORVASC) 5 MG tablet Take 1 tablet (5 mg total) by mouth daily. 90 tablet 2  . aspirin 81 MG tablet Take 81 mg by mouth daily.    Marland Kitchen atorvastatin (LIPITOR) 10 MG tablet TAKE 1 TABLET BY MOUTH  EVERY DAY 90 tablet 0  . Calcium Carbonate-Vitamin D (CALCIUM 600+D PO) Take 1 tablet by mouth daily.    . cetirizine (ZYRTEC) 10 MG tablet Take 10 mg by mouth  daily.    . cholecalciferol (VITAMIN D) 1000 units tablet Take 4,000 Units by mouth daily.     . clopidogrel (PLAVIX) 75 MG tablet Take 1 tablet (75 mg total) by mouth daily with breakfast. 90 tablet 3  . FERREX 150 150 MG capsule Take 150 mg by mouth 2 (two) times daily.  11  . gabapentin (NEURONTIN) 300 MG capsule Take 300 mg by mouth daily.     Marland Kitchen glimepiride (AMARYL) 1 MG tablet Take 1 mg by mouth 2 (two) times daily.    Marland Kitchen levothyroxine (SYNTHROID, LEVOTHROID) 50 MCG tablet TAKE 1 TABLET BY MOUTH  EVERY DAY 90 tablet 0  . lidocaine-prilocaine (EMLA) cream Apply to affected area once 30 g 3  . metFORMIN (GLUCOPHAGE-XR) 500 MG 24 hr tablet Take 1,000 mg by mouth 2 (two) times daily.    . Misc Natural Products (OSTEO BI-FLEX ADV TRIPLE ST) TABS Take 1 tablet by mouth 2 (two) times daily.    . Multiple Vitamin (MULTIVITAMIN) capsule Take 1 capsule by mouth daily.    . ondansetron (ZOFRAN) 8 MG  tablet Take 1 tablet (8 mg total) by mouth 2 (two) times daily as needed for refractory nausea / vomiting. Start on day 3 after chemotherapy. 30 tablet 1  . prochlorperazine (COMPAZINE) 10 MG tablet Take 1 tablet (10 mg total) by mouth every 6 (six) hours as needed (Nausea or vomiting). 30 tablet 1  . tetrahydrozoline-zinc (VISINE-AC) 0.05-0.25 % ophthalmic solution Place 1 drop into both eyes 2 (two) times daily as needed (dry eyes).    . Triamcinolone Acetonide (NASACORT ALLERGY 24HR NA) Place 1 spray into the nose daily as needed (allergies).    . triamterene-hydrochlorothiazide (MAXZIDE) 75-50 MG tablet Take 1 tablet by mouth daily.    Loura Pardon Salicylate (ASPERCREME EX) Apply 1 application topically daily as needed (joint pain).    . vitamin B-12 (CYANOCOBALAMIN) 500 MCG tablet Take 500 mcg by mouth daily.     No current facility-administered medications for this visit.     PHYSICAL EXAMINATION: ECOG PERFORMANCE STATUS: 1 - Symptomatic but completely ambulatory  Vitals:   11/28/17 0941  BP: 96/71  Pulse: 82  Resp: 19  Temp: 97.8 F (36.6 C)  SpO2: 100%   Filed Weights   11/28/17 0941  Weight: 165 lb 8 oz (75.1 kg)    GENERAL:alert, no distress and comfortable SKIN: no rashes or significant lesions EYES: sclera clear OROPHARYNX:no thrush or ulcers LYMPH:  no palpable cervical lymphadenopathy  LUNGS: distant breath sounds with normal breathing effort HEART: regular rate & rhythm, no lower extremity edema ABDOMEN:abdomen soft, non-tender and normal bowel sounds Musculoskeletal:no cyanosis of digits and no clubbing  NEURO: alert & oriented x 3 with fluent speech, no focal motor/sensory deficits PAC healing well, no erythema or drainage    LABORATORY DATA:  I have reviewed the data as listed CBC Latest Ref Rng & Units 11/28/2017 11/22/2017 11/06/2017  WBC 4.0 - 10.5 K/uL 4.2 6.2 5.2  Hemoglobin 12.0 - 15.0 g/dL 8.3(L) 7.9(L) 8.3(L)  Hematocrit 36.0 - 46.0 % 27.2(L)  26.8(L) 27.8(L)  Platelets 150 - 400 K/uL 369 497(H) 485(H)     CMP Latest Ref Rng & Units 11/28/2017 11/06/2017 10/25/2017  Glucose 70 - 99 mg/dL 179(H) 151(H) -  BUN 8 - 23 mg/dL 10 11 -  Creatinine 0.44 - 1.00 mg/dL 1.04(H) 0.95 0.90  Sodium 135 - 145 mmol/L 142 134(L) -  Potassium 3.5 - 5.1 mmol/L 3.8 4.1 -  Chloride 98 -  111 mmol/L 106 97(L) -  CO2 22 - 32 mmol/L 24 27 -  Calcium 8.9 - 10.3 mg/dL 9.7 9.8 -  Total Protein 6.5 - 8.1 g/dL 6.7 7.5 -  Total Bilirubin 0.3 - 1.2 mg/dL 0.4 0.2(L) -  Alkaline Phos 38 - 126 U/L 84 81 -  AST 15 - 41 U/L 19 20 -  ALT 0 - 44 U/L 13 10 -      RADIOGRAPHIC STUDIES: I have personally reviewed the radiological images as listed and agreed with the findings in the report. No results found.   ASSESSMENT & PLAN: 67 y.o. female with h/o CAD, HTN, HL, DM, and depression with positive cologuard test and rectosigmoid colon mass   1. Adenocarcinoma of the rectosigmoid colon, moderately differentiated, cT3N1M0, stage IIIB, MMR normal  -Ms. Mcquinn appears stable. She is doing well, other than intermittent diarrhea. We reviewed symptom management, she will maximize imodium, she knows to call if that is not enough and will prescribe lomotil.  -She underwent PAC placement, attended chemo edu class, and picked up anti-emetics. We reviewed dosing instructions.  -we again reviewed potential chemotherapy-related toxicities, she is ready to proceed. Will begin cycle 1 neoadjuvant FOLFOX on 11/20. Her BP is mildly low today, she is asymptomatic. Hgb 8.3. Will monitor closely. Labs otherwise adequate to proceed with treatment. I recommend to add 1 liter NS over 2 hours with pump d/c on day 3. I plan to see her back in 1 week for toxicity check.  -neoadjuvant cycle 1 FOLFOX start 11/20, IVF with pump d/c -f/u in 1 week for toxicity check then with each cycle.  2. Iron deficiency anemia, secondary to #1 -s/p IV Feraheme x2 on 11/8 and 11/15; she continues  oral iron once daily   3. Depression  4. Social support -she has family and friend who can help her when needed. She lives alone, independent at home  5. Constipation -she initially had constipation and was using laxative PRN, now she has diarrhea -I reviewed imodium use, and to increase hydration in general, especially with diarrhea. I urged her to call back if imodium is not enough   PLAN: -Labs reviewed, proceed with cycle 1 neoadjuvant FOLFOX on 11/20 -IVF with pump d/c on day 3 -Lab, f/u NP in 1 week  -F/u in 2 weeks with cycle 2 -maximize imodium PRN   All questions were answered. The patient knows to call the clinic with any problems, questions or concerns. No barriers to learning was detected. I spent 20 minutes counseling the patient face to face. The total time spent in the appointment was 25 minutes and more than 50% was on counseling and review of test results     Alla Feeling, NP 11/28/17

## 2017-11-28 ENCOUNTER — Inpatient Hospital Stay (HOSPITAL_BASED_OUTPATIENT_CLINIC_OR_DEPARTMENT_OTHER): Payer: Medicare Other | Admitting: Nurse Practitioner

## 2017-11-28 ENCOUNTER — Encounter: Payer: Self-pay | Admitting: Hematology

## 2017-11-28 ENCOUNTER — Inpatient Hospital Stay: Payer: Medicare Other

## 2017-11-28 ENCOUNTER — Other Ambulatory Visit: Payer: Self-pay | Admitting: Nurse Practitioner

## 2017-11-28 ENCOUNTER — Encounter: Payer: Self-pay | Admitting: Nurse Practitioner

## 2017-11-28 ENCOUNTER — Telehealth: Payer: Self-pay | Admitting: Nurse Practitioner

## 2017-11-28 VITALS — BP 96/71 | HR 82 | Temp 97.8°F | Resp 19 | Ht 66.5 in | Wt 165.5 lb

## 2017-11-28 DIAGNOSIS — E039 Hypothyroidism, unspecified: Secondary | ICD-10-CM

## 2017-11-28 DIAGNOSIS — K219 Gastro-esophageal reflux disease without esophagitis: Secondary | ICD-10-CM

## 2017-11-28 DIAGNOSIS — E785 Hyperlipidemia, unspecified: Secondary | ICD-10-CM

## 2017-11-28 DIAGNOSIS — Z79899 Other long term (current) drug therapy: Secondary | ICD-10-CM | POA: Diagnosis not present

## 2017-11-28 DIAGNOSIS — F329 Major depressive disorder, single episode, unspecified: Secondary | ICD-10-CM

## 2017-11-28 DIAGNOSIS — C20 Malignant neoplasm of rectum: Secondary | ICD-10-CM

## 2017-11-28 DIAGNOSIS — K5900 Constipation, unspecified: Secondary | ICD-10-CM

## 2017-11-28 DIAGNOSIS — Z7982 Long term (current) use of aspirin: Secondary | ICD-10-CM

## 2017-11-28 DIAGNOSIS — D630 Anemia in neoplastic disease: Secondary | ICD-10-CM | POA: Diagnosis not present

## 2017-11-28 DIAGNOSIS — Z5111 Encounter for antineoplastic chemotherapy: Secondary | ICD-10-CM | POA: Diagnosis not present

## 2017-11-28 DIAGNOSIS — I2510 Atherosclerotic heart disease of native coronary artery without angina pectoris: Secondary | ICD-10-CM

## 2017-11-28 DIAGNOSIS — C19 Malignant neoplasm of rectosigmoid junction: Secondary | ICD-10-CM | POA: Diagnosis not present

## 2017-11-28 DIAGNOSIS — M129 Arthropathy, unspecified: Secondary | ICD-10-CM

## 2017-11-28 DIAGNOSIS — Z87891 Personal history of nicotine dependence: Secondary | ICD-10-CM

## 2017-11-28 DIAGNOSIS — G629 Polyneuropathy, unspecified: Secondary | ICD-10-CM

## 2017-11-28 DIAGNOSIS — D689 Coagulation defect, unspecified: Secondary | ICD-10-CM

## 2017-11-28 DIAGNOSIS — E1140 Type 2 diabetes mellitus with diabetic neuropathy, unspecified: Secondary | ICD-10-CM

## 2017-11-28 DIAGNOSIS — I10 Essential (primary) hypertension: Secondary | ICD-10-CM

## 2017-11-28 DIAGNOSIS — R918 Other nonspecific abnormal finding of lung field: Secondary | ICD-10-CM

## 2017-11-28 DIAGNOSIS — Z8042 Family history of malignant neoplasm of prostate: Secondary | ICD-10-CM

## 2017-11-28 DIAGNOSIS — R05 Cough: Secondary | ICD-10-CM

## 2017-11-28 DIAGNOSIS — R599 Enlarged lymph nodes, unspecified: Secondary | ICD-10-CM

## 2017-11-28 DIAGNOSIS — Z7984 Long term (current) use of oral hypoglycemic drugs: Secondary | ICD-10-CM

## 2017-11-28 LAB — CMP (CANCER CENTER ONLY)
ALT: 13 U/L (ref 0–44)
AST: 19 U/L (ref 15–41)
Albumin: 3.4 g/dL — ABNORMAL LOW (ref 3.5–5.0)
Alkaline Phosphatase: 84 U/L (ref 38–126)
Anion gap: 12 (ref 5–15)
BUN: 10 mg/dL (ref 8–23)
CO2: 24 mmol/L (ref 22–32)
Calcium: 9.7 mg/dL (ref 8.9–10.3)
Chloride: 106 mmol/L (ref 98–111)
Creatinine: 1.04 mg/dL — ABNORMAL HIGH (ref 0.44–1.00)
GFR, Est AFR Am: >60 mL/min (ref >60)
GFR, Estimated: 54 mL/min — ABNORMAL LOW (ref >60)
Glucose, Bld: 179 mg/dL — ABNORMAL HIGH (ref 70–99)
Potassium: 3.8 mmol/L (ref 3.5–5.1)
Sodium: 142 mmol/L (ref 135–145)
Total Bilirubin: 0.4 mg/dL (ref 0.3–1.2)
Total Protein: 6.7 g/dL (ref 6.5–8.1)

## 2017-11-28 LAB — CBC WITH DIFFERENTIAL (CANCER CENTER ONLY)
Abs Immature Granulocytes: 0 10*3/uL (ref 0.00–0.07)
Basophils Absolute: 0 10*3/uL (ref 0.0–0.1)
Basophils Relative: 1 %
Eosinophils Absolute: 0.3 10*3/uL (ref 0.0–0.5)
Eosinophils Relative: 6 %
HCT: 27.2 % — ABNORMAL LOW (ref 36.0–46.0)
Hemoglobin: 8.3 g/dL — ABNORMAL LOW (ref 12.0–15.0)
Immature Granulocytes: 0 %
Lymphocytes Relative: 36 %
Lymphs Abs: 1.5 10*3/uL (ref 0.7–4.0)
MCH: 25.3 pg — ABNORMAL LOW (ref 26.0–34.0)
MCHC: 30.5 g/dL (ref 30.0–36.0)
MCV: 82.9 fL (ref 80.0–100.0)
Monocytes Absolute: 0.3 10*3/uL (ref 0.1–1.0)
Monocytes Relative: 8 %
Neutro Abs: 2 10*3/uL (ref 1.7–7.7)
Neutrophils Relative %: 49 %
Platelet Count: 369 10*3/uL (ref 150–400)
RBC: 3.28 MIL/uL — ABNORMAL LOW (ref 3.87–5.11)
RDW: 22.3 % — ABNORMAL HIGH (ref 11.5–15.5)
WBC Count: 4.2 10*3/uL (ref 4.0–10.5)
nRBC: 0 % (ref 0.0–0.2)

## 2017-11-28 MED ORDER — SODIUM CHLORIDE 0.9% FLUSH
10.0000 mL | Freq: Once | INTRAVENOUS | Status: AC | PRN
  Administered 2017-11-28: 10 mL
  Filled 2017-11-28: qty 10

## 2017-11-28 MED ORDER — HEPARIN SOD (PORK) LOCK FLUSH 100 UNIT/ML IV SOLN
500.0000 [IU] | Freq: Once | INTRAVENOUS | Status: AC | PRN
  Administered 2017-11-28: 500 [IU]
  Filled 2017-11-28: qty 5

## 2017-11-28 NOTE — Progress Notes (Signed)
Met w/ pt to introduce myself as her Arboriculturist.  Unfortunately there aren't any foundations offering copay assistance for her Dx and the type of ins she has.  I offered the [place], went over what it covers, gave her an expense sheet and the income requirement.  She would like to apply so she will bring her proof of income on 11/29/17.  I also gave her my card for any questions or concerns she may have in the future.

## 2017-11-28 NOTE — Telephone Encounter (Signed)
Printed calendar and avs. °

## 2017-11-29 ENCOUNTER — Inpatient Hospital Stay: Payer: Medicare Other

## 2017-11-29 ENCOUNTER — Encounter: Payer: Self-pay | Admitting: General Practice

## 2017-11-29 ENCOUNTER — Other Ambulatory Visit: Payer: Self-pay | Admitting: *Deleted

## 2017-11-29 VITALS — BP 80/50 | HR 85 | Temp 98.4°F | Resp 18

## 2017-11-29 DIAGNOSIS — C19 Malignant neoplasm of rectosigmoid junction: Secondary | ICD-10-CM | POA: Diagnosis not present

## 2017-11-29 DIAGNOSIS — I739 Peripheral vascular disease, unspecified: Secondary | ICD-10-CM

## 2017-11-29 DIAGNOSIS — C20 Malignant neoplasm of rectum: Secondary | ICD-10-CM

## 2017-11-29 MED ORDER — DEXTROSE 5 % IV SOLN
Freq: Once | INTRAVENOUS | Status: AC
  Administered 2017-11-29: 09:00:00 via INTRAVENOUS
  Filled 2017-11-29: qty 250

## 2017-11-29 MED ORDER — SODIUM CHLORIDE 0.9 % IV SOLN
2400.0000 mg/m2 | INTRAVENOUS | Status: DC
  Administered 2017-11-29: 4550 mg via INTRAVENOUS
  Filled 2017-11-29: qty 91

## 2017-11-29 MED ORDER — PALONOSETRON HCL INJECTION 0.25 MG/5ML
INTRAVENOUS | Status: AC
  Filled 2017-11-29: qty 5

## 2017-11-29 MED ORDER — FLUOROURACIL CHEMO INJECTION 2.5 GM/50ML
400.0000 mg/m2 | Freq: Once | INTRAVENOUS | Status: AC
  Administered 2017-11-29: 750 mg via INTRAVENOUS
  Filled 2017-11-29: qty 15

## 2017-11-29 MED ORDER — DEXAMETHASONE SODIUM PHOSPHATE 10 MG/ML IJ SOLN
10.0000 mg | Freq: Once | INTRAMUSCULAR | Status: AC
  Administered 2017-11-29: 10 mg via INTRAVENOUS

## 2017-11-29 MED ORDER — LEUCOVORIN CALCIUM INJECTION 350 MG
400.0000 mg/m2 | Freq: Once | INTRAVENOUS | Status: AC
  Administered 2017-11-29: 760 mg via INTRAVENOUS
  Filled 2017-11-29: qty 38

## 2017-11-29 MED ORDER — PALONOSETRON HCL INJECTION 0.25 MG/5ML
0.2500 mg | Freq: Once | INTRAVENOUS | Status: AC
  Administered 2017-11-29: 0.25 mg via INTRAVENOUS

## 2017-11-29 MED ORDER — DEXAMETHASONE SODIUM PHOSPHATE 10 MG/ML IJ SOLN
INTRAMUSCULAR | Status: AC
  Filled 2017-11-29: qty 1

## 2017-11-29 MED ORDER — OXALIPLATIN CHEMO INJECTION 100 MG/20ML
85.0000 mg/m2 | Freq: Once | INTRAVENOUS | Status: AC
  Administered 2017-11-29: 160 mg via INTRAVENOUS
  Filled 2017-11-29: qty 32

## 2017-11-29 NOTE — Patient Instructions (Signed)
[facility] Discharge Instructions for Patients Receiving Chemotherapy  Today you received the following chemotherapy agents Oxaliplatin, Leucovorin, 5FU.    To help prevent nausea and vomiting after your treatment, we encourage you to take your nausea medication as prescribed.   If you develop nausea and vomiting that is not controlled by your nausea medication, call the clinic.   BELOW ARE SYMPTOMS THAT SHOULD BE REPORTED IMMEDIATELY:  *FEVER GREATER THAN 100.5 F  *CHILLS WITH OR WITHOUT FEVER  NAUSEA AND VOMITING THAT IS NOT CONTROLLED WITH YOUR NAUSEA MEDICATION  *UNUSUAL SHORTNESS OF BREATH  *UNUSUAL BRUISING OR BLEEDING  TENDERNESS IN MOUTH AND THROAT WITH OR WITHOUT PRESENCE OF ULCERS  *URINARY PROBLEMS  *BOWEL PROBLEMS  UNUSUAL RASH Items with * indicate a potential emergency and should be followed up as soon as possible.  Feel free to call the clinic should you have any questions or concerns. The clinic phone number is [phone].  Please show the CHEMO ALERT CARD at check-in to the Emergency Department and triage nurse.   

## 2017-11-29 NOTE — Progress Notes (Signed)
[place] Psychosocial Distress Screening Clinical Social Work  Clinical Social Work was referred by distress screening protocol.  The patient scored a 7 on the Psychosocial Distress Thermometer which indicates moderate distress. Clinical Social Worker contacted patient by phone to assess for distress and other psychosocial needs. Per patient , she is doing well during treatments, fatigue is the most common symptom.  Granddaughter and son check on patient frequently, she feels their support.  [place] Transportation program is working well.  Patient meeting w Financial Advocate to apply for [place].  No other needs at this time, patient has my contact information and was encouraged to call as needed for support/resources.    ONCBCN DISTRESS SCREENING 11/23/2017  Screening Type Initial Screening  Distress experienced in past week (1-10) 7  Practical problem type Transportation  Family Problem type Other (comment)  Emotional problem type Nervousness/Anxiety;Adjusting to illness  Spiritual/Religous concerns type Facing my mortality  Information Concerns Type Lack of info about diagnosis;Lack of info about treatment  Physical Problem type Sleep/insomnia;Loss of appetitie;Tingling hands/feet;Swollen arms/legs  Other [phone]    Clinical Social Worker follow up needed: No.  If yes, follow up plan:  Beverely Pace, [place], LCSW Clinical Social Worker Phone:  [phone]

## 2017-11-29 NOTE — Progress Notes (Signed)
vas 

## 2017-11-30 ENCOUNTER — Telehealth: Payer: Self-pay | Admitting: Hematology

## 2017-11-30 NOTE — Telephone Encounter (Signed)
Called and verified appointment with patient for Nov 22 @ 10am and 3 pm

## 2017-12-01 ENCOUNTER — Inpatient Hospital Stay: Payer: Medicare Other

## 2017-12-01 VITALS — BP 140/62 | HR 81 | Temp 98.3°F | Resp 16

## 2017-12-01 VITALS — BP 173/72 | HR 76 | Temp 98.0°F | Resp 18

## 2017-12-01 DIAGNOSIS — C20 Malignant neoplasm of rectum: Secondary | ICD-10-CM

## 2017-12-01 DIAGNOSIS — C19 Malignant neoplasm of rectosigmoid junction: Secondary | ICD-10-CM | POA: Diagnosis not present

## 2017-12-01 MED ORDER — SODIUM CHLORIDE 0.9 % IV SOLN
Freq: Once | INTRAVENOUS | Status: AC
  Administered 2017-12-01: 11:00:00 via INTRAVENOUS
  Filled 2017-12-01: qty 250

## 2017-12-01 MED ORDER — HEPARIN SOD (PORK) LOCK FLUSH 100 UNIT/ML IV SOLN
500.0000 [IU] | Freq: Once | INTRAVENOUS | Status: AC | PRN
  Administered 2017-12-01: 500 [IU]
  Filled 2017-12-01: qty 5

## 2017-12-01 MED ORDER — SODIUM CHLORIDE 0.9% FLUSH
10.0000 mL | INTRAVENOUS | Status: DC | PRN
  Administered 2017-12-01: 10 mL
  Filled 2017-12-01: qty 10

## 2017-12-01 MED ORDER — SODIUM CHLORIDE 0.9% FLUSH
10.0000 mL | Freq: Once | INTRAVENOUS | Status: AC | PRN
  Administered 2017-12-01: 10 mL
  Filled 2017-12-01: qty 10

## 2017-12-01 NOTE — Progress Notes (Signed)
Met with Nicole Sellers during pump D/C. Treatment initiated on 11/29/17. Patient states no nausea "so far". Use of anti-emetics reviewed. Patient has my contact information for questions or concerns.

## 2017-12-01 NOTE — Progress Notes (Signed)
Dr. Burr Medico notified of elevated BP 173/72. Pt took BP medication this morning. Pt ok to be d/c. Pt denies HA, SOB, dizziness, or chest pressure. VS otherwise WNL upon d/c.

## 2017-12-01 NOTE — Patient Instructions (Signed)
Dehydration, Adult Dehydration is a condition in which there is not enough fluid or water in the body. This happens when you lose more fluids than you take in. Important organs, such as the kidneys, brain, and heart, cannot function without a proper amount of fluids. Any loss of fluids from the body can lead to dehydration. Dehydration can range from mild to severe. This condition should be treated right away to prevent it from becoming severe. What are the causes? This condition may be caused by:  Vomiting.  Diarrhea.  Excessive sweating, such as from heat exposure or exercise.  Not drinking enough fluid, especially: ? When ill. ? While doing activity that requires a lot of energy.  Excessive urination.  Fever.  Infection.  Certain medicines, such as medicines that cause the body to lose excess fluid (diuretics).  Inability to access safe drinking water.  Reduced physical ability to get adequate water and food.  What increases the risk? This condition is more likely to develop in people:  Who have a poorly controlled long-term (chronic) illness, such as diabetes, heart disease, or kidney disease.  Who are age 65 or older.  Who are disabled.  Who live in a place with high altitude.  Who play endurance sports.  What are the signs or symptoms? Symptoms of mild dehydration may include:  Thirst.  Dry lips.  Slightly dry mouth.  Dry, warm skin.  Dizziness. Symptoms of moderate dehydration may include:  Very dry mouth.  Muscle cramps.  Dark urine. Urine may be the color of tea.  Decreased urine production.  Decreased tear production.  Heartbeat that is irregular or faster than normal (palpitations).  Headache.  Light-headedness, especially when you stand up from a sitting position.  Fainting (syncope). Symptoms of severe dehydration may include:  Changes in skin, such as: ? Cold and clammy skin. ? Blotchy (mottled) or pale skin. ? Skin that does  not quickly return to normal after being lightly pinched and released (poor skin turgor).  Changes in body fluids, such as: ? Extreme thirst. ? No tear production. ? Inability to sweat when body temperature is high, such as in hot weather. ? Very little urine production.  Changes in vital signs, such as: ? Weak pulse. ? Pulse that is more than 100 beats a minute when sitting still. ? Rapid breathing. ? Low blood pressure.  Other changes, such as: ? Sunken eyes. ? Cold hands and feet. ? Confusion. ? Lack of energy (lethargy). ? Difficulty waking up from sleep. ? Short-term weight loss. ? Unconsciousness. How is this diagnosed? This condition is diagnosed based on your symptoms and a physical exam. Blood and urine tests may be done to help confirm the diagnosis. How is this treated? Treatment for this condition depends on the severity. Mild or moderate dehydration can often be treated at home. Treatment should be started right away. Do not wait until dehydration becomes severe. Severe dehydration is an emergency and it needs to be treated in a hospital. Treatment for mild dehydration may include:  Drinking more fluids.  Replacing salts and minerals in your blood (electrolytes) that you may have lost. Treatment for moderate dehydration may include:  Drinking an oral rehydration solution (ORS). This is a drink that helps you replace fluids and electrolytes (rehydrate). It can be found at pharmacies and retail stores. Treatment for severe dehydration may include:  Receiving fluids through an IV tube.  Receiving an electrolyte solution through a feeding tube that is passed through your nose   and into your stomach (nasogastric tube, or NG tube).  Correcting any abnormalities in electrolytes.  Treating the underlying cause of dehydration. Follow these instructions at home:  If directed by your health care provider, drink an ORS: ? Make an ORS by following instructions on the  package. ? Start by drinking small amounts, about  cup (120 mL) every 5-10 minutes. ? Slowly increase how much you drink until you have taken the amount recommended by your health care provider.  Drink enough clear fluid to keep your urine clear or pale yellow. If you were told to drink an ORS, finish the ORS first, then start slowly drinking other clear fluids. Drink fluids such as: ? Water. Do not drink only water. Doing that can lead to having too little salt (sodium) in the body (hyponatremia). ? Ice chips. ? Fruit juice that you have added water to (diluted fruit juice). ? Low-calorie sports drinks.  Avoid: ? Alcohol. ? Drinks that contain a lot of sugar. These include high-calorie sports drinks, fruit juice that is not diluted, and soda. ? Caffeine. ? Foods that are greasy or contain a lot of fat or sugar.  Take over-the-counter and prescription medicines only as told by your health care provider.  Do not take sodium tablets. This can lead to having too much sodium in the body (hypernatremia).  Eat foods that contain a healthy balance of electrolytes, such as bananas, oranges, potatoes, tomatoes, and spinach.  Keep all follow-up visits as told by your health care provider. This is important. Contact a health care provider if:  You have abdominal pain that: ? Gets worse. ? Stays in one area (localizes).  You have a rash.  You have a stiff neck.  You are more irritable than usual.  You are sleepier or more difficult to wake up than usual.  You feel weak or dizzy.  You feel very thirsty.  You have urinated only a small amount of very dark urine over 6-8 hours. Get help right away if:  You have symptoms of severe dehydration.  You cannot drink fluids without vomiting.  Your symptoms get worse with treatment.  You have a fever.  You have a severe headache.  You have vomiting or diarrhea that: ? Gets worse. ? Does not go away.  You have blood or green matter  (bile) in your vomit.  You have blood in your stool. This may cause stool to look black and tarry.  You have not urinated in 6-8 hours.  You faint.  Your heart rate while sitting still is over 100 beats a minute.  You have trouble breathing. This information is not intended to replace advice given to you by your health care provider. Make sure you discuss any questions you have with your health care provider. Document Released: 12/27/2004 Document Revised: 07/24/2015 Document Reviewed: 02/20/2015 Elsevier Interactive Patient Education  2018 [employer]  

## 2017-12-04 NOTE — Progress Notes (Signed)
[place]  [phone] [phone]  Clinic Follow up Note   Patient Care Team: Reynold Bowen, MD as PCP - General (Endocrinology) Lorretta Harp, MD as PCP - Cardiology (Cardiology) 12/05/2017  SUMMARY OF ONCOLOGIC HISTORY: Oncology History   Cancer Staging Rectal cancer [facility]) Staging form: Colon and Rectum, AJCC 8th Edition - Clinical stage from 10/04/2017: Stage IIIB (cT3, cN1, cM0) - Signed by Truitt Merle, MD on 11/18/2017       Rectal cancer ([facility])   10/04/2017 Procedure    Colonoscopy per Dr. Collene Mares 10/04/17 shows a large, non-obstructing horse-shoe shaped mass was found in the recto-sigmoid colon at 10 cm; the mass was partially circumferential involving one-half of the lumen circumference. The mass measured seven cm in length    10/04/2017 Initial Biopsy    Rectum, at 10-17 cm, mass biopsy -invasive adenocarcinoma, moderately differentiated  ICH stains for MLH1, MSH2, MSH6, and PMS2 are intact (normal)    10/04/2017 Cancer Staging    Staging form: Colon and Rectum, AJCC 8th Edition - Clinical stage from 10/04/2017: Stage IIIB (cT3, cN1, cM0) - Signed by Truitt Merle, MD on 11/18/2017    10/25/2017 Imaging    CT CAP IMPRESSION: 1. Irregular eccentric mass within the sigmoid colon. There are at least 3 abnormal appearing lymph node adjacent to the sigmoid colon at the level of the mass concerning for the possibility of local nodal metastatic disease. 2. Multiple pulmonary nodules as described above. These are indeterminate in etiology. Recommend attention on follow-up.    11/06/2017 Initial Diagnosis    Adenocarcinoma of colon ([place])    11/16/2017 Imaging    Pelvic MRI 11/16/17  IMPRESSION: Rectal adenocarcinoma T stage:   T3 Rectal adenocarcinoma N stage:  N1 Distance from tumor to the anal sphincter is 6.9 cm.    11/28/2017 -  Chemotherapy    The patient had palonosetron (ALOXI) injection 0.25 mg, 0.25 mg, Intravenous,  Once, 1 of 8  cycles Administration: 0.25 mg (11/29/2017) leucovorin 760 mg in dextrose 5 % 250 mL infusion, 400 mg/m2 = 760 mg, Intravenous,  Once, 1 of 8 cycles Administration: 760 mg (11/29/2017) oxaliplatin (ELOXATIN) 160 mg in dextrose 5 % 500 mL chemo infusion, 85 mg/m2 = 160 mg, Intravenous,  Once, 1 of 8 cycles Administration: 160 mg (11/29/2017) fluorouracil (ADRUCIL) chemo injection 750 mg, 400 mg/m2 = 750 mg, Intravenous,  Once, 1 of 8 cycles Administration: 750 mg (11/29/2017) fluorouracil (ADRUCIL) 4,550 mg in sodium chloride 0.9 % 59 mL chemo infusion, 2,400 mg/m2 = 4,550 mg, Intravenous, 1 Day/Dose, 1 of 8 cycles Administration: 4,550 mg (11/29/2017)  for chemotherapy treatment.     CURRENT THERAPY:  total neoadjuvant chemo FOLFOX q2 weeks x4 months, starting 11/20, followed by concurrent chemoRT   INTERVAL HISTORY: Ms. Winther returns for toxicity check as scheduled. She began neoadjuvant chemo cycle 1 FOLFOX on 11/20. She received IVF on day 3 with pump d/c. She could not tell much difference after IVF. She has not felt well all week. She has increased fatigue from baseline, only able to tolerate short periods of activity then requires rest. She has situational depression. She continues to have diarrhea, 3-4 episodes per day. She takes liquid imodium which helps slow it down. She had nausea one night that went away without medication. Denies constipation or emesis. She developed a sore mouth and throat on day 4 that has limited solid and liquid intake. She is not drinking well. Pain is 10/10 with swallowing. She has  not tried anything for pain. Appetite is low, she has lost a few more pounds. She continues to avoid cold exposures, denies neuropathy. Chronic productive cough is at baseline, white phlegm. Denies fever, chills, chest pain, dyspnea, leg edema, skin rash, bleeding, or rectal pain.    MEDICAL HISTORY:  Past Medical History:  Diagnosis Date  . Allergy   . Anemia   . Arthritis     "joints sometimes" (12/25/2012)  . Clotting disorder ([place])   . Colon cancer ([place])   . Critical lower limb ischemia   . Gangrene of toe, Rt second toe 12/25/2012  . GERD (gastroesophageal reflux disease)   . High cholesterol   . Hypertension   . Hypothyroidism   . PAD (peripheral artery disease) ([place])   . S/P arterial stent, 12/25/13, successful diamondback orbital rotational arthrectomy, PTA using chocolate  balloon and stenting using I DEV stent of long segment calcified high-grade proximal and mid r 12/25/2012  . Tobacco abuse   . Type II diabetes mellitus ([place])     SURGICAL HISTORY: Past Surgical History:  Procedure Laterality Date  . ABDOMINAL AORTAGRAM  12/20/2012   Procedure: ABDOMINAL AORTAGRAM;  Surgeon: Lorretta Harp, MD;  Location: [facility] CATH LAB;  Service: Cardiovascular;;  . ABDOMINAL HYSTERECTOMY  2000  . ANGIOPLASTY / STENTING FEMORAL Right 12/25/2012  . ATHERECTOMY Right 12/25/2012   Procedure: ATHERECTOMY;  Surgeon: Lorretta Harp, MD;  Location: [facility] CATH LAB;  Service: Cardiovascular;  Laterality: Right;  right SFA  . HERNIA REPAIR    . LOWER EXTREMITY ANGIOGRAM N/A 12/20/2012   Procedure: LOWER EXTREMITY ANGIOGRAM;  Surgeon: Lorretta Harp, MD;  Location: [facility] CATH LAB;  Service: Cardiovascular;  Laterality: N/A;  . LOWER EXTREMITY ANGIOGRAM N/A 12/25/2012   Procedure: LOWER EXTREMITY ANGIOGRAM;  Surgeon: Lorretta Harp, MD;  Location: [facility] CATH LAB;  Service: Cardiovascular;  Laterality: N/A;  . LOWER EXTREMITY INTERVENTION  05/08/2017  . LOWER EXTREMITY INTERVENTION Bilateral 05/08/2017   Procedure: LOWER EXTREMITY INTERVENTION;  Surgeon: Lorretta Harp, MD;  Location: [place] CV LAB;  Service: Cardiovascular;  Laterality: Bilateral;  . PERIPHERAL VASCULAR BALLOON ANGIOPLASTY Right 05/08/2017   Procedure: PERIPHERAL VASCULAR BALLOON ANGIOPLASTY;  Surgeon: Lorretta Harp, MD;  Location: [place] CV LAB;  Service: Cardiovascular;  Laterality: Right;  sfa   . PERIPHERAL VASCULAR INTERVENTION Right 05/08/2017   Procedure: PERIPHERAL VASCULAR INTERVENTION;  Surgeon: Lorretta Harp, MD;  Location: [place] CV LAB;  Service: Cardiovascular;  Laterality: Right;  ext iliac  . PORTACATH PLACEMENT Right 11/22/2017   Procedure: INSERTION PORT-A-CATH;  Surgeon: Leighton Ruff, MD;  Location: [facility] ORS;  Service: General;  Laterality: Right;  . TOE SURGERY Right    2n toe   . UMBILICAL HERNIA REPAIR  2000    I have reviewed the social history and family history with the patient and they are unchanged from previous note.  ALLERGIES:  is allergic to kiwi extract; lisinopril; and plavix [clopidogrel bisulfate].  MEDICATIONS:  Current Outpatient Medications  Medication Sig Dispense Refill  . acetaminophen (TYLENOL) 500 MG tablet Take 500 mg by mouth 2 (two) times daily as needed for moderate pain or headache.     . albuterol (PROVENTIL HFA;VENTOLIN HFA) 108 (90 BASE) MCG/ACT inhaler Inhale 2 puffs into the lungs every 6 (six) hours as needed for wheezing or shortness of breath.    Marland Kitchen amLODipine (NORVASC) 5 MG tablet Take 1 tablet (5 mg total) by mouth daily. 90 tablet 2  . aspirin 81  MG tablet Take 81 mg by mouth daily.    Marland Kitchen atorvastatin (LIPITOR) 10 MG tablet TAKE 1 TABLET BY MOUTH  EVERY DAY 90 tablet 0  . Calcium Carbonate-Vitamin D (CALCIUM 600+D PO) Take 1 tablet by mouth daily.    . cetirizine (ZYRTEC) 10 MG tablet Take 10 mg by mouth daily.    . cholecalciferol (VITAMIN D) 1000 units tablet Take 4,000 Units by mouth daily.     . clopidogrel (PLAVIX) 75 MG tablet Take 1 tablet (75 mg total) by mouth daily with breakfast. 90 tablet 3  . FERREX 150 150 MG capsule Take 150 mg by mouth 2 (two) times daily.  11  . gabapentin (NEURONTIN) 300 MG capsule Take 300 mg by mouth daily.     Marland Kitchen glimepiride (AMARYL) 1 MG tablet Take 1 mg by mouth 2 (two) times daily.    Marland Kitchen levothyroxine (SYNTHROID, LEVOTHROID) 50 MCG tablet TAKE 1 TABLET BY MOUTH  EVERY DAY 90  tablet 0  . lidocaine-prilocaine (EMLA) cream Apply to affected area once 30 g 3  . metFORMIN (GLUCOPHAGE-XR) 500 MG 24 hr tablet Take 1,000 mg by mouth 2 (two) times daily.    . Misc Natural Products (OSTEO BI-FLEX ADV TRIPLE ST) TABS Take 1 tablet by mouth 2 (two) times daily.    . Multiple Vitamin (MULTIVITAMIN) capsule Take 1 capsule by mouth daily.    . ondansetron (ZOFRAN) 8 MG tablet Take 1 tablet (8 mg total) by mouth 2 (two) times daily as needed for refractory nausea / vomiting. Start on day 3 after chemotherapy. 30 tablet 1  . prochlorperazine (COMPAZINE) 10 MG tablet Take 1 tablet (10 mg total) by mouth every 6 (six) hours as needed (Nausea or vomiting). 30 tablet 1  . tetrahydrozoline-zinc (VISINE-AC) 0.05-0.25 % ophthalmic solution Place 1 drop into both eyes 2 (two) times daily as needed (dry eyes).    . Triamcinolone Acetonide (NASACORT ALLERGY 24HR NA) Place 1 spray into the nose daily as needed (allergies).    . triamterene-hydrochlorothiazide (MAXZIDE) 75-50 MG tablet Take 1 tablet by mouth daily.    Loura Pardon Salicylate (ASPERCREME EX) Apply 1 application topically daily as needed (joint pain).    . vitamin B-12 (CYANOCOBALAMIN) 500 MCG tablet Take 500 mcg by mouth daily.    . diphenoxylate-atropine (LOMOTIL) 2.5-0.025 MG tablet Take 1-2 tablets by mouth 4 (four) times daily as needed for diarrhea or loose stools. 40 tablet 2  . mirtazapine (REMERON) 7.5 MG tablet Take 1 tablet (7.5 mg total) by mouth at bedtime. If tolerating after 1 week, can increase to 2 tabs at night 60 tablet 1   Current Facility-Administered Medications  Medication Dose Route Frequency Provider Last Rate Last Dose  . 0.9 %  sodium chloride infusion   Intravenous Once Alla Feeling, NP 500 mL/hr at 12/05/17 1058      PHYSICAL EXAMINATION: ECOG PERFORMANCE STATUS: 2 - Symptomatic, <50% confined to bed  Vitals:   12/05/17 0945  BP: 135/69  Pulse: 88  Resp: 16  Temp: 98.6 F (37 C)  SpO2: 99%     Filed Weights   12/05/17 0945  Weight: 161 lb 9.6 oz (73.3 kg)    GENERAL:alert, no distress and comfortable SKIN: no rashes or significant lesions EYES: sclera clear OROPHARYNX: mild pharyngeal erythema, small ulceration to left lateral tongue  LYMPH:  no palpable cervical lymphadenopathy  LUNGS: distant breath sounds with normal breathing effort HEART: regular rate & rhythm, no lower extremity edema ABDOMEN:abdomen soft, non-tender  and normal bowel sounds NEURO: alert & oriented x 3 with fluent speech, no focal motor/sensory deficits PAC without erythema    LABORATORY DATA:  I have reviewed the data as listed CBC Latest Ref Rng & Units 12/05/2017 11/28/2017 11/22/2017  WBC 4.0 - 10.5 K/uL 3.8(L) 4.2 6.2  Hemoglobin 12.0 - 15.0 g/dL 9.0(L) 8.3(L) 7.9(L)  Hematocrit 36.0 - 46.0 % 28.2(L) 27.2(L) 26.8(L)  Platelets 150 - 400 K/uL 351 369 497(H)     CMP Latest Ref Rng & Units 12/05/2017 11/28/2017 11/06/2017  Glucose 70 - 99 mg/dL 229(H) 179(H) 151(H)  BUN 8 - 23 mg/dL '13 10 11  '$ Creatinine 0.44 - 1.00 mg/dL 1.15(H) 1.04(H) 0.95  Sodium 135 - 145 mmol/L 137 142 134(L)  Potassium 3.5 - 5.1 mmol/L 3.8 3.8 4.1  Chloride 98 - 111 mmol/L 102 106 97(L)  CO2 22 - 32 mmol/L '25 24 27  '$ Calcium 8.9 - 10.3 mg/dL 9.2 9.7 9.8  Total Protein 6.5 - 8.1 g/dL 6.4(L) 6.7 7.5  Total Bilirubin 0.3 - 1.2 mg/dL 0.3 0.4 0.2(L)  Alkaline Phos 38 - 126 U/L 76 84 81  AST 15 - 41 U/L 13(L) 19 20  ALT 0 - 44 U/L '8 13 10      '$ RADIOGRAPHIC STUDIES: I have personally reviewed the radiological images as listed and agreed with the findings in the report. No results found.   ASSESSMENT & PLAN: 67 y.o.female with h/o CAD, HTN, HL, DM, and depression with positive cologuard test and rectosigmoid colon mass   1. Adenocarcinoma of the rectosigmoid colon, moderately differentiated,cT3N1M0, stage IIIB,MMR normal  -Ms. Bogusz appears stable. She completed cycle 1 neoadjuvant FOLFOX. After 1 week  she remains moderately fatigued and developed grade 2 mucositis, limiting po intake. She continues to have diarrhea with increased creatinine today. Will support with IVF 1 liter over 2 hours. Will give lomotil in clinic and prescription sent to pharmacy. I prescribed magic mouthwash and encouraged her to take liquid tylenol for oral pain, to promote improved po intake.  -for her persistently low appetite and further weight loss, I recommend for her to begin appetite stimulant with mirtazapine, she agrees. Rx sent to pharmacy.  -CBC is stable.  -f/u in 1 week before cycle 2; if she does not recover well, may consider dose reduction   2. Iron deficiency anemia, secondary to #1 -s/p IV Feraheme x2 on 11/8 and 11/15; she continues oral iron once daily  -has not taken it for last few days due to throat pain -iron studies have improved after IV Feraheme, continue monitoring   3. Depression -she has situational depression about her diagnosis and treatment  -begin mirtazapine 11/26   4. Social support -she has family and friend who can help her when needed. She lives alone, independent at home  5. Constipation, now diarrhea  -she initially had constipation and was using laxative PRN, now she has diarrhea -I reviewed imodium use, and to increase hydration in general, especially with diarrhea. I urged her to call back if imodium is not enough   6. Decreased appetite, weight loss  -I recommend for her to begin appetite stimulant with mirtazapine 7.5 mg po qHS, if she tolerates well after 1 week can increase to 2 tabs at night.  -I encouraged her to try nutrition supplement such as boost/ensure   7. Mucositis  -She developed grade 2 mucositis, with pain limiting po intake. I prescribed magic mouthwash and recommend liquid tylenol for pain. If tylenol is not enough  she knows to call back.  -I encouraged her to eat better and remain hydrated this week, to prepare for next cycle   PLAN: -labs  reviewed -NS 1 liter over 2 hours today, for elevated Cr and decreased po intake secondary to mucositis  -Rx: mirtazapine, lomotil (x1 dose in clinic), and magic mouthwash  -liquid tylenol for oral pain  -f/u in 1 week with cycle 2  All questions were answered. The patient knows to call the clinic with any problems, questions or concerns. No barriers to learning was detected. I spent 20 minutes counseling the patient face to face. The total time spent in the appointment was 25 minutes and more than 50% was on counseling and review of test results     Alla Feeling, NP 12/05/17

## 2017-12-05 ENCOUNTER — Inpatient Hospital Stay: Payer: Medicare Other

## 2017-12-05 ENCOUNTER — Encounter: Payer: Self-pay | Admitting: Hematology

## 2017-12-05 ENCOUNTER — Inpatient Hospital Stay (HOSPITAL_BASED_OUTPATIENT_CLINIC_OR_DEPARTMENT_OTHER): Payer: Medicare Other | Admitting: Nurse Practitioner

## 2017-12-05 ENCOUNTER — Encounter: Payer: Self-pay | Admitting: Nurse Practitioner

## 2017-12-05 VITALS — BP 135/69 | HR 88 | Temp 98.6°F | Resp 16 | Ht 66.0 in | Wt 161.6 lb

## 2017-12-05 DIAGNOSIS — R599 Enlarged lymph nodes, unspecified: Secondary | ICD-10-CM

## 2017-12-05 DIAGNOSIS — E785 Hyperlipidemia, unspecified: Secondary | ICD-10-CM

## 2017-12-05 DIAGNOSIS — F329 Major depressive disorder, single episode, unspecified: Secondary | ICD-10-CM

## 2017-12-05 DIAGNOSIS — C19 Malignant neoplasm of rectosigmoid junction: Secondary | ICD-10-CM | POA: Diagnosis not present

## 2017-12-05 DIAGNOSIS — C20 Malignant neoplasm of rectum: Secondary | ICD-10-CM

## 2017-12-05 DIAGNOSIS — Z7982 Long term (current) use of aspirin: Secondary | ICD-10-CM

## 2017-12-05 DIAGNOSIS — M129 Arthropathy, unspecified: Secondary | ICD-10-CM

## 2017-12-05 DIAGNOSIS — Z79899 Other long term (current) drug therapy: Secondary | ICD-10-CM

## 2017-12-05 DIAGNOSIS — E1140 Type 2 diabetes mellitus with diabetic neuropathy, unspecified: Secondary | ICD-10-CM

## 2017-12-05 DIAGNOSIS — G629 Polyneuropathy, unspecified: Secondary | ICD-10-CM

## 2017-12-05 DIAGNOSIS — D689 Coagulation defect, unspecified: Secondary | ICD-10-CM

## 2017-12-05 DIAGNOSIS — K5900 Constipation, unspecified: Secondary | ICD-10-CM

## 2017-12-05 DIAGNOSIS — Z8042 Family history of malignant neoplasm of prostate: Secondary | ICD-10-CM

## 2017-12-05 DIAGNOSIS — Z7984 Long term (current) use of oral hypoglycemic drugs: Secondary | ICD-10-CM

## 2017-12-05 DIAGNOSIS — K1230 Oral mucositis (ulcerative), unspecified: Secondary | ICD-10-CM

## 2017-12-05 DIAGNOSIS — C189 Malignant neoplasm of colon, unspecified: Secondary | ICD-10-CM

## 2017-12-05 DIAGNOSIS — K219 Gastro-esophageal reflux disease without esophagitis: Secondary | ICD-10-CM

## 2017-12-05 DIAGNOSIS — Z87891 Personal history of nicotine dependence: Secondary | ICD-10-CM

## 2017-12-05 DIAGNOSIS — Z5111 Encounter for antineoplastic chemotherapy: Secondary | ICD-10-CM | POA: Diagnosis not present

## 2017-12-05 DIAGNOSIS — I2510 Atherosclerotic heart disease of native coronary artery without angina pectoris: Secondary | ICD-10-CM

## 2017-12-05 DIAGNOSIS — I10 Essential (primary) hypertension: Secondary | ICD-10-CM

## 2017-12-05 DIAGNOSIS — E039 Hypothyroidism, unspecified: Secondary | ICD-10-CM

## 2017-12-05 DIAGNOSIS — D630 Anemia in neoplastic disease: Secondary | ICD-10-CM

## 2017-12-05 DIAGNOSIS — R918 Other nonspecific abnormal finding of lung field: Secondary | ICD-10-CM

## 2017-12-05 DIAGNOSIS — R05 Cough: Secondary | ICD-10-CM

## 2017-12-05 DIAGNOSIS — R630 Anorexia: Secondary | ICD-10-CM

## 2017-12-05 DIAGNOSIS — R634 Abnormal weight loss: Secondary | ICD-10-CM

## 2017-12-05 LAB — CMP (CANCER CENTER ONLY)
ALT: 8 U/L (ref 0–44)
AST: 13 U/L — ABNORMAL LOW (ref 15–41)
Albumin: 3.4 g/dL — ABNORMAL LOW (ref 3.5–5.0)
Alkaline Phosphatase: 76 U/L (ref 38–126)
Anion gap: 10 (ref 5–15)
BUN: 13 mg/dL (ref 8–23)
CO2: 25 mmol/L (ref 22–32)
Calcium: 9.2 mg/dL (ref 8.9–10.3)
Chloride: 102 mmol/L (ref 98–111)
Creatinine: 1.15 mg/dL — ABNORMAL HIGH (ref 0.44–1.00)
GFR, Est AFR Am: 56 mL/min — ABNORMAL LOW (ref >60)
GFR, Estimated: 49 mL/min — ABNORMAL LOW (ref >60)
Glucose, Bld: 229 mg/dL — ABNORMAL HIGH (ref 70–99)
Potassium: 3.8 mmol/L (ref 3.5–5.1)
Sodium: 137 mmol/L (ref 135–145)
Total Bilirubin: 0.3 mg/dL (ref 0.3–1.2)
Total Protein: 6.4 g/dL — ABNORMAL LOW (ref 6.5–8.1)

## 2017-12-05 LAB — CBC WITH DIFFERENTIAL (CANCER CENTER ONLY)
Abs Immature Granulocytes: 0 10*3/uL (ref 0.00–0.07)
Basophils Absolute: 0 10*3/uL (ref 0.0–0.1)
Basophils Relative: 0 %
Eosinophils Absolute: 0.1 10*3/uL (ref 0.0–0.5)
Eosinophils Relative: 3 %
HCT: 28.2 % — ABNORMAL LOW (ref 36.0–46.0)
Hemoglobin: 9 g/dL — ABNORMAL LOW (ref 12.0–15.0)
Immature Granulocytes: 0 %
Lymphocytes Relative: 37 %
Lymphs Abs: 1.4 10*3/uL (ref 0.7–4.0)
MCH: 26.5 pg (ref 26.0–34.0)
MCHC: 31.9 g/dL (ref 30.0–36.0)
MCV: 82.9 fL (ref 80.0–100.0)
Monocytes Absolute: 0.2 10*3/uL (ref 0.1–1.0)
Monocytes Relative: 4 %
Neutro Abs: 2.1 10*3/uL (ref 1.7–7.7)
Neutrophils Relative %: 56 %
Platelet Count: 351 10*3/uL (ref 150–400)
RBC: 3.4 MIL/uL — ABNORMAL LOW (ref 3.87–5.11)
RDW: 20.9 % — ABNORMAL HIGH (ref 11.5–15.5)
WBC Count: 3.8 10*3/uL — ABNORMAL LOW (ref 4.0–10.5)
nRBC: 0 % (ref 0.0–0.2)

## 2017-12-05 LAB — IRON AND TIBC
Iron: 49 ug/dL (ref 41–142)
Saturation Ratios: 19 % — ABNORMAL LOW (ref 21–57)
TIBC: 260 ug/dL (ref 236–444)
UIBC: 211 ug/dL (ref 120–384)

## 2017-12-05 LAB — FERRITIN: Ferritin: 698 ng/mL — ABNORMAL HIGH (ref 11–307)

## 2017-12-05 LAB — CEA (IN HOUSE-CHCC): CEA (CHCC-In House): 59.88 ng/mL — ABNORMAL HIGH (ref 0.00–5.00)

## 2017-12-05 MED ORDER — HEPARIN SOD (PORK) LOCK FLUSH 100 UNIT/ML IV SOLN
500.0000 [IU] | Freq: Once | INTRAVENOUS | Status: AC | PRN
  Administered 2017-12-05: 500 [IU]
  Filled 2017-12-05: qty 5

## 2017-12-05 MED ORDER — DIPHENOXYLATE-ATROPINE 2.5-0.025 MG PO TABS
2.0000 | ORAL_TABLET | Freq: Once | ORAL | Status: AC
  Administered 2017-12-05: 2 via ORAL

## 2017-12-05 MED ORDER — ONDANSETRON HCL 4 MG/2ML IJ SOLN: 8.0000 mg | Freq: Once | INTRAMUSCULAR | Status: DC

## 2017-12-05 MED ORDER — DIPHENOXYLATE-ATROPINE 2.5-0.025 MG PO TABS
ORAL_TABLET | ORAL | Status: AC
  Filled 2017-12-05: qty 2

## 2017-12-05 MED ORDER — DIPHENOXYLATE-ATROPINE 2.5-0.025 MG PO TABS: 1.0000 | ORAL_TABLET | Freq: Four times a day (QID) | ORAL | 2 refills | Status: DC | PRN

## 2017-12-05 MED ORDER — MIRTAZAPINE 7.5 MG PO TABS: 7.5000 mg | ORAL_TABLET | Freq: Every day | ORAL | 1 refills | Status: DC

## 2017-12-05 MED ORDER — MAGIC MOUTHWASH W/LIDOCAINE: 5.0000 mL | Freq: Three times a day (TID) | ORAL | 0 refills | Status: DC

## 2017-12-05 MED ORDER — SODIUM CHLORIDE 0.9 % IV SOLN
Freq: Once | INTRAVENOUS | Status: AC
  Administered 2017-12-05: 11:00:00 via INTRAVENOUS
  Filled 2017-12-05: qty 250

## 2017-12-05 MED ORDER — HEPARIN SOD (PORK) LOCK FLUSH 100 UNIT/ML IV SOLN
250.0000 [IU] | Freq: Once | INTRAVENOUS | Status: DC | PRN
  Filled 2017-12-05: qty 5

## 2017-12-05 MED ORDER — SODIUM CHLORIDE 0.9 % IV SOLN: 8.0000 mg | Freq: Once | INTRAVENOUS | Status: DC

## 2017-12-05 MED ORDER — SODIUM CHLORIDE 0.9% FLUSH
10.0000 mL | Freq: Once | INTRAVENOUS | Status: AC | PRN
  Administered 2017-12-05: 10 mL
  Filled 2017-12-05: qty 10

## 2017-12-05 NOTE — Addendum Note (Signed)
Addended by: Lenox Ponds E on: 12/05/2017 01:22 PM   Modules accepted: Orders

## 2017-12-05 NOTE — Addendum Note (Signed)
Addended by: Lenox Ponds E on: 12/05/2017 02:00 PM   Modules accepted: Orders

## 2017-12-05 NOTE — Addendum Note (Signed)
Addended by: Alla Feeling on: 12/05/2017 01:49 PM   Modules accepted: Orders

## 2017-12-05 NOTE — Progress Notes (Signed)
Pt is approved for the $700 [facility] grant.  °

## 2017-12-08 ENCOUNTER — Telehealth: Payer: Self-pay | Admitting: Nurse Practitioner

## 2017-12-08 NOTE — Telephone Encounter (Signed)
Called patient and verified her appointments.  Patient stated she will get a calendar of her appointments at her next appointment.

## 2017-12-10 NOTE — Progress Notes (Signed)
[place]  [phone] [phone]  Clinic Follow up Note   Patient Care Team: Reynold Bowen, MD as PCP - General (Endocrinology) Lorretta Harp, MD as PCP - Cardiology (Cardiology) 12/12/2017  SUMMARY OF ONCOLOGIC HISTORY: Oncology History   Cancer Staging Rectal cancer [facility]) Staging form: Colon and Rectum, AJCC 8th Edition - Clinical stage from 10/04/2017: Stage IIIB (cT3, cN1, cM0) - Signed by Truitt Merle, MD on 11/18/2017       Rectal cancer ([place])   10/04/2017 Procedure    Colonoscopy per Dr. Collene Mares 10/04/17 shows a large, non-obstructing horse-shoe shaped mass was found in the recto-sigmoid colon at 10 cm; the mass was partially circumferential involving one-half of the lumen circumference. The mass measured seven cm in length    10/04/2017 Initial Biopsy    Rectum, at 10-17 cm, mass biopsy -invasive adenocarcinoma, moderately differentiated  ICH stains for MLH1, MSH2, MSH6, and PMS2 are intact (normal)    10/04/2017 Cancer Staging    Staging form: Colon and Rectum, AJCC 8th Edition - Clinical stage from 10/04/2017: Stage IIIB (cT3, cN1, cM0) - Signed by Truitt Merle, MD on 11/18/2017    10/25/2017 Imaging    CT CAP IMPRESSION: 1. Irregular eccentric mass within the sigmoid colon. There are at least 3 abnormal appearing lymph node adjacent to the sigmoid colon at the level of the mass concerning for the possibility of local nodal metastatic disease. 2. Multiple pulmonary nodules as described above. These are indeterminate in etiology. Recommend attention on follow-up.    11/06/2017 Initial Diagnosis    Adenocarcinoma of colon ([place])    11/16/2017 Imaging    Pelvic MRI 11/16/17  IMPRESSION: Rectal adenocarcinoma T stage:   T3 Rectal adenocarcinoma N stage:  N1 Distance from tumor to the anal sphincter is 6.9 cm.    11/28/2017 -  Chemotherapy    The patient had palonosetron (ALOXI) injection 0.25 mg, 0.25 mg, Intravenous,  Once, 1 of 8  cycles Administration: 0.25 mg (11/29/2017) pegfilgrastim-cbqv (UDENYCA) injection 6 mg, 6 mg, Subcutaneous, Once, 0 of 7 cycles leucovorin 760 mg in dextrose 5 % 250 mL infusion, 400 mg/m2 = 760 mg, Intravenous,  Once, 1 of 8 cycles Administration: 760 mg (11/29/2017) oxaliplatin (ELOXATIN) 160 mg in dextrose 5 % 500 mL chemo infusion, 85 mg/m2 = 160 mg, Intravenous,  Once, 1 of 8 cycles Administration: 160 mg (11/29/2017) fluorouracil (ADRUCIL) chemo injection 750 mg, 400 mg/m2 = 750 mg, Intravenous,  Once, 1 of 8 cycles Administration: 750 mg (11/29/2017) fluorouracil (ADRUCIL) 4,550 mg in sodium chloride 0.9 % 59 mL chemo infusion, 2,400 mg/m2 = 4,550 mg, Intravenous, 1 Day/Dose, 1 of 8 cycles Administration: 4,550 mg (11/29/2017)  for chemotherapy treatment.    CURRENT THERAPY: totalneoadjuvant chemo FOLFOXq2 weeks x4 months, starting 11/20, followed by concurrent chemoRT  INTERVAL HISTORY: Ms. Gerhart returns for follow up and cycle 2 neoadjuvant FOLFOX as scheduled. She completed cycle 1 on 11/20. She was seen 1 week later for toxicity check. She had developed mucositis with decreased po intake and mild dehydration. She received IVF support and I prescribed mirtazapine and magic mouthwash. She did not pick up magic mouth but made her own home remedy which improved her symptoms. She began mirtazapine. She has eaten better this past week, appetite is improved. Her fatigue improved as well, she has felt better this week. She has less diarrhea, usually once every 2 days. Takes lomotil preventatively. No blood in stool. Otherwise she denies fever, chills, cough, chest pain,  dyspnea, leg swelling, neuropathy, or pain.    MEDICAL HISTORY:  Past Medical History:  Diagnosis Date  . Allergy   . Anemia   . Arthritis    "joints sometimes" (12/25/2012)  . Clotting disorder ([place])   . Colon cancer ([place])   . Critical lower limb ischemia   . Gangrene of toe, Rt second toe 12/25/2012  . GERD  (gastroesophageal reflux disease)   . High cholesterol   . Hypertension   . Hypothyroidism   . PAD (peripheral artery disease) ([place])   . S/P arterial stent, 12/25/13, successful diamondback orbital rotational arthrectomy, PTA using chocolate  balloon and stenting using I DEV stent of long segment calcified high-grade proximal and mid r 12/25/2012  . Tobacco abuse   . Type II diabetes mellitus ([place])     SURGICAL HISTORY: Past Surgical History:  Procedure Laterality Date  . ABDOMINAL AORTAGRAM  12/20/2012   Procedure: ABDOMINAL AORTAGRAM;  Surgeon: Lorretta Harp, MD;  Location: [facility] CATH LAB;  Service: Cardiovascular;;  . ABDOMINAL HYSTERECTOMY  2000  . ANGIOPLASTY / STENTING FEMORAL Right 12/25/2012  . ATHERECTOMY Right 12/25/2012   Procedure: ATHERECTOMY;  Surgeon: Lorretta Harp, MD;  Location: [facility] CATH LAB;  Service: Cardiovascular;  Laterality: Right;  right SFA  . HERNIA REPAIR    . LOWER EXTREMITY ANGIOGRAM N/A 12/20/2012   Procedure: LOWER EXTREMITY ANGIOGRAM;  Surgeon: Lorretta Harp, MD;  Location: [facility] CATH LAB;  Service: Cardiovascular;  Laterality: N/A;  . LOWER EXTREMITY ANGIOGRAM N/A 12/25/2012   Procedure: LOWER EXTREMITY ANGIOGRAM;  Surgeon: Lorretta Harp, MD;  Location: [facility] CATH LAB;  Service: Cardiovascular;  Laterality: N/A;  . LOWER EXTREMITY INTERVENTION  05/08/2017  . LOWER EXTREMITY INTERVENTION Bilateral 05/08/2017   Procedure: LOWER EXTREMITY INTERVENTION;  Surgeon: Lorretta Harp, MD;  Location: [place] CV LAB;  Service: Cardiovascular;  Laterality: Bilateral;  . PERIPHERAL VASCULAR BALLOON ANGIOPLASTY Right 05/08/2017   Procedure: PERIPHERAL VASCULAR BALLOON ANGIOPLASTY;  Surgeon: Lorretta Harp, MD;  Location: [place] CV LAB;  Service: Cardiovascular;  Laterality: Right;  sfa  . PERIPHERAL VASCULAR INTERVENTION Right 05/08/2017   Procedure: PERIPHERAL VASCULAR INTERVENTION;  Surgeon: Lorretta Harp, MD;  Location: [place] CV LAB;   Service: Cardiovascular;  Laterality: Right;  ext iliac  . PORTACATH PLACEMENT Right 11/22/2017   Procedure: INSERTION PORT-A-CATH;  Surgeon: Leighton Ruff, MD;  Location: [facility] ORS;  Service: General;  Laterality: Right;  . TOE SURGERY Right    2n toe   . UMBILICAL HERNIA REPAIR  2000    I have reviewed the social history and family history with the patient and they are unchanged from previous note.  ALLERGIES:  is allergic to kiwi extract; lisinopril; and plavix [clopidogrel bisulfate].  MEDICATIONS:  Current Outpatient Medications  Medication Sig Dispense Refill  . acetaminophen (TYLENOL) 500 MG tablet Take 500 mg by mouth 2 (two) times daily as needed for moderate pain or headache.     . albuterol (PROVENTIL HFA;VENTOLIN HFA) 108 (90 BASE) MCG/ACT inhaler Inhale 2 puffs into the lungs every 6 (six) hours as needed for wheezing or shortness of breath.    Marland Kitchen amLODipine (NORVASC) 5 MG tablet Take 1 tablet (5 mg total) by mouth daily. 90 tablet 2  . aspirin 81 MG tablet Take 81 mg by mouth daily.    Marland Kitchen atorvastatin (LIPITOR) 10 MG tablet TAKE 1 TABLET BY MOUTH  EVERY DAY 90 tablet 0  . Calcium Carbonate-Vitamin D (CALCIUM 600+D PO) Take 1 tablet  by mouth daily.    . cetirizine (ZYRTEC) 10 MG tablet Take 10 mg by mouth daily.    . cholecalciferol (VITAMIN D) 1000 units tablet Take 4,000 Units by mouth daily.     . clopidogrel (PLAVIX) 75 MG tablet Take 1 tablet (75 mg total) by mouth daily with breakfast. 90 tablet 3  . diphenoxylate-atropine (LOMOTIL) 2.5-0.025 MG tablet Take 1-2 tablets by mouth 4 (four) times daily as needed for diarrhea or loose stools. 40 tablet 2  . FERREX 150 150 MG capsule Take 150 mg by mouth 2 (two) times daily.  11  . gabapentin (NEURONTIN) 300 MG capsule Take 300 mg by mouth daily.     Marland Kitchen glimepiride (AMARYL) 1 MG tablet Take 1 mg by mouth 2 (two) times daily.    Marland Kitchen levothyroxine (SYNTHROID, LEVOTHROID) 50 MCG tablet TAKE 1 TABLET BY MOUTH  EVERY DAY 90 tablet 0  .  lidocaine-prilocaine (EMLA) cream Apply to affected area once 30 g 3  . magic mouthwash w/lidocaine SOLN Take 5 mLs by mouth 3 (three) times daily. Swish and Spit 240 mL 0  . metFORMIN (GLUCOPHAGE-XR) 500 MG 24 hr tablet Take 1,000 mg by mouth 2 (two) times daily.    . mirtazapine (REMERON) 7.5 MG tablet Take 1 tablet (7.5 mg total) by mouth at bedtime. If tolerating after 1 week, can increase to 2 tabs at night 60 tablet 1  . Misc Natural Products (OSTEO BI-FLEX ADV TRIPLE ST) TABS Take 1 tablet by mouth 2 (two) times daily.    . Multiple Vitamin (MULTIVITAMIN) capsule Take 1 capsule by mouth daily.    . ondansetron (ZOFRAN) 8 MG tablet Take 1 tablet (8 mg total) by mouth 2 (two) times daily as needed for refractory nausea / vomiting. Start on day 3 after chemotherapy. 30 tablet 1  . prochlorperazine (COMPAZINE) 10 MG tablet Take 1 tablet (10 mg total) by mouth every 6 (six) hours as needed (Nausea or vomiting). 30 tablet 1  . tetrahydrozoline-zinc (VISINE-AC) 0.05-0.25 % ophthalmic solution Place 1 drop into both eyes 2 (two) times daily as needed (dry eyes).    . Triamcinolone Acetonide (NASACORT ALLERGY 24HR NA) Place 1 spray into the nose daily as needed (allergies).    . triamterene-hydrochlorothiazide (MAXZIDE) 75-50 MG tablet Take 1 tablet by mouth daily.    Loura Pardon Salicylate (ASPERCREME EX) Apply 1 application topically daily as needed (joint pain).    . vitamin B-12 (CYANOCOBALAMIN) 500 MCG tablet Take 500 mcg by mouth daily.     No current facility-administered medications for this visit.     PHYSICAL EXAMINATION: ECOG PERFORMANCE STATUS: 1 - Symptomatic but completely ambulatory  Vitals:   12/12/17 0956  BP: (!) 146/66  Pulse: 80  Resp: 18  Temp: 98.5 F (36.9 C)  SpO2: 100%   Filed Weights   12/12/17 0956  Weight: 161 lb 1.6 oz (73.1 kg)    GENERAL:alert, no distress and comfortable SKIN:  no rashes or significant lesions EYES:  sclera clear OROPHARYNX:no thrush  or ulcers LYMPH:  no palpable cervical or supraclavicular lymphadenopathy  LUNGS: clear to auscultation with normal breathing effort HEART: regular rate & rhythm, no lower extremity edema ABDOMEN:abdomen soft, non-tender and normal bowel sounds NEURO: alert & oriented x 3 with fluent speech, no focal motor/sensory deficits PAC without erythema    LABORATORY DATA:  I have reviewed the data as listed CBC Latest Ref Rng & Units 12/12/2017 12/05/2017 11/28/2017  WBC 4.0 - 10.5 K/uL 3.4(L) 3.8(L)  4.2  Hemoglobin 12.0 - 15.0 g/dL 9.3(L) 9.0(L) 8.3(L)  Hematocrit 36.0 - 46.0 % 29.5(L) 28.2(L) 27.2(L)  Platelets 150 - 400 K/uL 302 351 369     CMP Latest Ref Rng & Units 12/12/2017 12/05/2017 11/28/2017  Glucose 70 - 99 mg/dL 146(H) 229(H) 179(H)  BUN 8 - 23 mg/dL _0 Creatinine 0.44 - 1.00 mg/dL 1.16(H) 1.15(H) 1.04(H)  Sodium 135 - 145 mmol/L 140 137 142  Potassium 3.5 - 5.1 mmol/L 3.8 3.8 3.8  Chloride 98 - 111 mmol/L 102 102 106  CO2 22 - 32 mmol/L _1 Calcium 8.9 - 10.3 mg/dL 10.0 9.2 9.7  Total Protein 6.5 - 8.1 g/dL 6.4(L) 6.4(L) 6.7  Total Bilirubin 0.3 - 1.2 mg/dL 0.3 0.3 0.4  Alkaline Phos 38 - 126 U/L 71 76 84  AST 15 - 41 U/L 23 13(L) 19  ALT 0 - 44 U/L _2 RADIOGRAPHIC STUDIES: I have personally reviewed the radiological images as listed and agreed with the findings in the report. No results found.   ASSESSMENT & PLAN: 67 y.o.female with h/o CAD, HTN, HL, DM, and depression with positive cologuard test and rectosigmoid colon mass   1. Adenocarcinoma of the rectosigmoid colon, moderately differentiated,cT3N1M0, stage IIIB,MMR normal -Ms. Newlun appears stable. She completed cycle 1 neoadjuvant FOLFOX. At 1 week she had developed moderate fatigue, grade 2 mucositis limiting po intake, and mild dehydration due to decreased po intake and diarrhea. She was given IVF, lomotil, mirtazapine, and began home remedy mouthwash. I recommend she call  pharmacy to prepare magic mouthwash to have on hand.  -she has recovered very well today. Mucositis resolved. Appetite, fatigue, and diarrhea improved.  -labs reviewed, she developed neutropenia with ANC 1.2. We reviewed infection precautions. I recommend to add Udenyca on day 3 with pump d/c. I reviewed possible side effects. I recommend she begin claritin on day 2 and continue 5-7 days after injection for possible bone pain. She agrees. -Cr is stable, I encouraged her to remain adequately hydrated and eat well. Labs adequate for treatment. -she will return on 12/4 for cycle 2 neoadjuvant FOLFOX, then 12/6 for pump d/c and udenyca -f/u in 2 weeks with cycle 3  2. Iron deficiency anemia, secondary to #1 -s/p IV Feraheme x2on 11/8 and 11/15; she continues oral iron once daily -has not taken it for last few days due to throat pain -iron studies have improved after IV Feraheme, continue monitoring   3. Depression -she has situational depression about her diagnosis and treatment  -begin mirtazapine 11/26   4. Social support -she has family and friend who can help her when needed. She lives alone, independent at home  5. Constipation, now diarrhea  -she initially had constipation and was using laxative PRN, now she has diarrhea -I reviewed imodium use, and to increase hydration in general, especially with diarrhea. I urged her to call back if imodium is not enough   6. Decreased appetite, weight loss  -I recommend for her to begin appetite stimulant with mirtazapine 7.5 mg po qHS, if she tolerates well after 1 week can increase to 2 tabs at night.  -I encouraged her to try nutrition supplement such as boost/ensure   7. Mucositis  -She developed grade 2 mucositis, with pain limiting po intake. I prescribed magic mouthwash and recommend liquid tylenol for pain. If tylenol is not enough she knows to call back.  -I encouraged her to eat better  and remain hydrated this week, to prepare for  next cycle   PLAN: -Labs reviewed, proceed with cycle 2 neoadjuvant FOLFOX on 12/4 -Added udenyca on day 3 with pump d/c for neutropenia ANC 1.2. OK to treat -f/u in 2 weeks with cycle 3  All questions were answered. The patient knows to call the clinic with any problems, questions or concerns. No barriers to learning was detected.     Alla Feeling, NP 12/12/17

## 2017-12-12 ENCOUNTER — Telehealth: Payer: Self-pay | Admitting: Hematology

## 2017-12-12 ENCOUNTER — Inpatient Hospital Stay: Payer: Medicare Other | Attending: Nurse Practitioner

## 2017-12-12 ENCOUNTER — Encounter: Payer: Self-pay | Admitting: Nurse Practitioner

## 2017-12-12 ENCOUNTER — Inpatient Hospital Stay (HOSPITAL_BASED_OUTPATIENT_CLINIC_OR_DEPARTMENT_OTHER): Payer: Medicare Other | Admitting: Nurse Practitioner

## 2017-12-12 ENCOUNTER — Inpatient Hospital Stay: Payer: Medicare Other

## 2017-12-12 VITALS — BP 146/66 | HR 80 | Temp 98.5°F | Resp 18 | Ht 66.0 in | Wt 161.1 lb

## 2017-12-12 DIAGNOSIS — C19 Malignant neoplasm of rectosigmoid junction: Secondary | ICD-10-CM | POA: Insufficient documentation

## 2017-12-12 DIAGNOSIS — Z5111 Encounter for antineoplastic chemotherapy: Secondary | ICD-10-CM | POA: Insufficient documentation

## 2017-12-12 DIAGNOSIS — Z79899 Other long term (current) drug therapy: Secondary | ICD-10-CM

## 2017-12-12 DIAGNOSIS — I10 Essential (primary) hypertension: Secondary | ICD-10-CM | POA: Diagnosis not present

## 2017-12-12 DIAGNOSIS — M129 Arthropathy, unspecified: Secondary | ICD-10-CM

## 2017-12-12 DIAGNOSIS — R197 Diarrhea, unspecified: Secondary | ICD-10-CM | POA: Insufficient documentation

## 2017-12-12 DIAGNOSIS — Z7982 Long term (current) use of aspirin: Secondary | ICD-10-CM | POA: Diagnosis not present

## 2017-12-12 DIAGNOSIS — I739 Peripheral vascular disease, unspecified: Secondary | ICD-10-CM

## 2017-12-12 DIAGNOSIS — Z7984 Long term (current) use of oral hypoglycemic drugs: Secondary | ICD-10-CM | POA: Insufficient documentation

## 2017-12-12 DIAGNOSIS — E785 Hyperlipidemia, unspecified: Secondary | ICD-10-CM | POA: Insufficient documentation

## 2017-12-12 DIAGNOSIS — I2510 Atherosclerotic heart disease of native coronary artery without angina pectoris: Secondary | ICD-10-CM

## 2017-12-12 DIAGNOSIS — K219 Gastro-esophageal reflux disease without esophagitis: Secondary | ICD-10-CM

## 2017-12-12 DIAGNOSIS — E860 Dehydration: Secondary | ICD-10-CM | POA: Diagnosis not present

## 2017-12-12 DIAGNOSIS — Z7689 Persons encountering health services in other specified circumstances: Secondary | ICD-10-CM | POA: Diagnosis not present

## 2017-12-12 DIAGNOSIS — F329 Major depressive disorder, single episode, unspecified: Secondary | ICD-10-CM

## 2017-12-12 DIAGNOSIS — K1230 Oral mucositis (ulcerative), unspecified: Secondary | ICD-10-CM | POA: Diagnosis not present

## 2017-12-12 DIAGNOSIS — R918 Other nonspecific abnormal finding of lung field: Secondary | ICD-10-CM | POA: Insufficient documentation

## 2017-12-12 DIAGNOSIS — E119 Type 2 diabetes mellitus without complications: Secondary | ICD-10-CM | POA: Insufficient documentation

## 2017-12-12 DIAGNOSIS — F17210 Nicotine dependence, cigarettes, uncomplicated: Secondary | ICD-10-CM | POA: Insufficient documentation

## 2017-12-12 DIAGNOSIS — E7800 Pure hypercholesterolemia, unspecified: Secondary | ICD-10-CM | POA: Diagnosis not present

## 2017-12-12 DIAGNOSIS — C20 Malignant neoplasm of rectum: Secondary | ICD-10-CM

## 2017-12-12 DIAGNOSIS — R630 Anorexia: Secondary | ICD-10-CM | POA: Diagnosis not present

## 2017-12-12 DIAGNOSIS — D630 Anemia in neoplastic disease: Secondary | ICD-10-CM | POA: Diagnosis not present

## 2017-12-12 DIAGNOSIS — E039 Hypothyroidism, unspecified: Secondary | ICD-10-CM | POA: Diagnosis not present

## 2017-12-12 DIAGNOSIS — R634 Abnormal weight loss: Secondary | ICD-10-CM

## 2017-12-12 DIAGNOSIS — R200 Anesthesia of skin: Secondary | ICD-10-CM | POA: Insufficient documentation

## 2017-12-12 LAB — CMP (CANCER CENTER ONLY)
ALT: 13 U/L (ref 0–44)
AST: 23 U/L (ref 15–41)
Albumin: 3.3 g/dL — ABNORMAL LOW (ref 3.5–5.0)
Alkaline Phosphatase: 71 U/L (ref 38–126)
Anion gap: 10 (ref 5–15)
BUN: 11 mg/dL (ref 8–23)
CO2: 28 mmol/L (ref 22–32)
Calcium: 10 mg/dL (ref 8.9–10.3)
Chloride: 102 mmol/L (ref 98–111)
Creatinine: 1.16 mg/dL — ABNORMAL HIGH (ref 0.44–1.00)
GFR, Est AFR Am: 56 mL/min — ABNORMAL LOW (ref >60)
GFR, Estimated: 48 mL/min — ABNORMAL LOW (ref >60)
Glucose, Bld: 146 mg/dL — ABNORMAL HIGH (ref 70–99)
Potassium: 3.8 mmol/L (ref 3.5–5.1)
Sodium: 140 mmol/L (ref 135–145)
Total Bilirubin: 0.3 mg/dL (ref 0.3–1.2)
Total Protein: 6.4 g/dL — ABNORMAL LOW (ref 6.5–8.1)

## 2017-12-12 LAB — CBC WITH DIFFERENTIAL (CANCER CENTER ONLY)
Abs Immature Granulocytes: 0.01 10*3/uL (ref 0.00–0.07)
Basophils Absolute: 0.1 10*3/uL (ref 0.0–0.1)
Basophils Relative: 2 %
Eosinophils Absolute: 0.3 10*3/uL (ref 0.0–0.5)
Eosinophils Relative: 8 %
HCT: 29.5 % — ABNORMAL LOW (ref 36.0–46.0)
Hemoglobin: 9.3 g/dL — ABNORMAL LOW (ref 12.0–15.0)
Immature Granulocytes: 0 %
Lymphocytes Relative: 44 %
Lymphs Abs: 1.5 10*3/uL (ref 0.7–4.0)
MCH: 26.7 pg (ref 26.0–34.0)
MCHC: 31.5 g/dL (ref 30.0–36.0)
MCV: 84.8 fL (ref 80.0–100.0)
Monocytes Absolute: 0.3 10*3/uL (ref 0.1–1.0)
Monocytes Relative: 9 %
Neutro Abs: 1.2 10*3/uL — ABNORMAL LOW (ref 1.7–7.7)
Neutrophils Relative %: 37 %
Platelet Count: 302 10*3/uL (ref 150–400)
RBC: 3.48 MIL/uL — ABNORMAL LOW (ref 3.87–5.11)
RDW: 22.5 % — ABNORMAL HIGH (ref 11.5–15.5)
WBC Count: 3.4 10*3/uL — ABNORMAL LOW (ref 4.0–10.5)
nRBC: 0 % (ref 0.0–0.2)

## 2017-12-12 MED ORDER — HEPARIN SOD (PORK) LOCK FLUSH 100 UNIT/ML IV SOLN
500.0000 [IU] | Freq: Once | INTRAVENOUS | Status: AC | PRN
  Administered 2017-12-12: 500 [IU]
  Filled 2017-12-12: qty 5

## 2017-12-12 MED ORDER — SODIUM CHLORIDE 0.9% FLUSH
10.0000 mL | Freq: Once | INTRAVENOUS | Status: AC | PRN
  Administered 2017-12-12: 10 mL
  Filled 2017-12-12: qty 10

## 2017-12-12 NOTE — Telephone Encounter (Signed)
Added comment to appointment notes 12/6 for patient to have injection with pump d/c appointment - no physical injection appointment needed. No other orders per 12/3 los.

## 2017-12-13 ENCOUNTER — Inpatient Hospital Stay: Payer: Medicare Other

## 2017-12-13 ENCOUNTER — Telehealth: Payer: Self-pay

## 2017-12-13 VITALS — BP 123/60 | HR 83 | Temp 98.2°F | Resp 18

## 2017-12-13 DIAGNOSIS — C20 Malignant neoplasm of rectum: Secondary | ICD-10-CM

## 2017-12-13 DIAGNOSIS — C19 Malignant neoplasm of rectosigmoid junction: Secondary | ICD-10-CM | POA: Diagnosis not present

## 2017-12-13 MED ORDER — SODIUM CHLORIDE 0.9 % IV SOLN
2400.0000 mg/m2 | INTRAVENOUS | Status: DC
  Administered 2017-12-13: 4550 mg via INTRAVENOUS
  Filled 2017-12-13: qty 91

## 2017-12-13 MED ORDER — OXALIPLATIN CHEMO INJECTION 100 MG/20ML
85.0000 mg/m2 | Freq: Once | INTRAVENOUS | Status: AC
  Administered 2017-12-13: 160 mg via INTRAVENOUS
  Filled 2017-12-13: qty 32

## 2017-12-13 MED ORDER — DEXTROSE 5 % IV SOLN
Freq: Once | INTRAVENOUS | Status: AC
  Administered 2017-12-13: 09:00:00 via INTRAVENOUS
  Filled 2017-12-13: qty 250

## 2017-12-13 MED ORDER — DEXAMETHASONE SODIUM PHOSPHATE 10 MG/ML IJ SOLN
10.0000 mg | Freq: Once | INTRAMUSCULAR | Status: AC
  Administered 2017-12-13: 10 mg via INTRAVENOUS

## 2017-12-13 MED ORDER — LEUCOVORIN CALCIUM INJECTION 350 MG
400.0000 mg/m2 | Freq: Once | INTRAVENOUS | Status: AC
  Administered 2017-12-13: 760 mg via INTRAVENOUS
  Filled 2017-12-13: qty 38

## 2017-12-13 MED ORDER — PALONOSETRON HCL INJECTION 0.25 MG/5ML
INTRAVENOUS | Status: AC
  Filled 2017-12-13: qty 5

## 2017-12-13 MED ORDER — PALONOSETRON HCL INJECTION 0.25 MG/5ML
0.2500 mg | Freq: Once | INTRAVENOUS | Status: AC
  Administered 2017-12-13: 0.25 mg via INTRAVENOUS

## 2017-12-13 MED ORDER — DEXTROSE 5 % IV SOLN
Freq: Once | INTRAVENOUS | Status: AC
  Administered 2017-12-13: 08:00:00 via INTRAVENOUS
  Filled 2017-12-13: qty 250

## 2017-12-13 MED ORDER — DEXAMETHASONE SODIUM PHOSPHATE 10 MG/ML IJ SOLN
INTRAMUSCULAR | Status: AC
  Filled 2017-12-13: qty 1

## 2017-12-13 MED ORDER — FLUOROURACIL CHEMO INJECTION 2.5 GM/50ML
400.0000 mg/m2 | Freq: Once | INTRAVENOUS | Status: AC
  Administered 2017-12-13: 750 mg via INTRAVENOUS
  Filled 2017-12-13: qty 15

## 2017-12-13 NOTE — Telephone Encounter (Signed)
Patient had a question regarding the shot she gets when her pump is discontinued.  Explained to the patient it is called Udenyca and it is to boost her white cell count, to reduce chances of infection.  Patient recalls Cira Rue NP discusses this with her.  Her questions were answered and the patient appreciated the phone call.

## 2017-12-13 NOTE — Patient Instructions (Signed)
[place] Discharge Instructions for Patients Receiving Chemotherapy  Today you received the following chemotherapy agents Oxaliplatin, Leucovorin, Fluorouracil.   To help prevent nausea and vomiting after your treatment, we encourage you to take your nausea medication as directed.  If you develop nausea and vomiting that is not controlled by your nausea medication, call the clinic.   BELOW ARE SYMPTOMS THAT SHOULD BE REPORTED IMMEDIATELY:  *FEVER GREATER THAN 100.5 F  *CHILLS WITH OR WITHOUT FEVER  NAUSEA AND VOMITING THAT IS NOT CONTROLLED WITH YOUR NAUSEA MEDICATION  *UNUSUAL SHORTNESS OF BREATH  *UNUSUAL BRUISING OR BLEEDING  TENDERNESS IN MOUTH AND THROAT WITH OR WITHOUT PRESENCE OF ULCERS  *URINARY PROBLEMS  *BOWEL PROBLEMS  UNUSUAL RASH Items with * indicate a potential emergency and should be followed up as soon as possible.  Feel free to call the clinic should you have any questions or concerns. The clinic phone number is [phone].  Please show the [place] at check-in to the Emergency Department and triage nurse.

## 2017-12-15 ENCOUNTER — Inpatient Hospital Stay: Payer: Medicare Other

## 2017-12-15 VITALS — BP 162/80 | HR 84 | Temp 98.9°F | Resp 17

## 2017-12-15 DIAGNOSIS — C19 Malignant neoplasm of rectosigmoid junction: Secondary | ICD-10-CM | POA: Diagnosis not present

## 2017-12-15 DIAGNOSIS — C20 Malignant neoplasm of rectum: Secondary | ICD-10-CM

## 2017-12-15 MED ORDER — PEGFILGRASTIM-CBQV 6 MG/0.6ML ~~LOC~~ SOSY
6.0000 mg | PREFILLED_SYRINGE | Freq: Once | SUBCUTANEOUS | Status: AC
  Administered 2017-12-15: 6 mg via SUBCUTANEOUS

## 2017-12-15 MED ORDER — SODIUM CHLORIDE 0.9% FLUSH
10.0000 mL | INTRAVENOUS | Status: DC | PRN
  Administered 2017-12-15: 10 mL
  Filled 2017-12-15: qty 10

## 2017-12-15 MED ORDER — HEPARIN SOD (PORK) LOCK FLUSH 100 UNIT/ML IV SOLN
500.0000 [IU] | Freq: Once | INTRAVENOUS | Status: AC | PRN
  Administered 2017-12-15: 500 [IU]
  Filled 2017-12-15: qty 5

## 2017-12-15 MED ORDER — PEGFILGRASTIM-CBQV 6 MG/0.6ML ~~LOC~~ SOSY
PREFILLED_SYRINGE | SUBCUTANEOUS | Status: AC
  Filled 2017-12-15: qty 0.6

## 2017-12-15 NOTE — Patient Instructions (Signed)
Pegfilgrastim injection What is this medicine? PEGFILGRASTIM (PEG fil gra stim) is a long-acting granulocyte colony-stimulating factor that stimulates the growth of neutrophils, a type of white blood cell important in the body's fight against infection. It is used to reduce the incidence of fever and infection in patients with certain types of cancer who are receiving chemotherapy that affects the bone marrow, and to increase survival after being exposed to high doses of radiation. This medicine may be used for other purposes; ask your health care provider or pharmacist if you have questions. COMMON BRAND NAME(S): Neulasta, Udencya What should I tell my health care provider before I take this medicine? They need to know if you have any of these conditions: -kidney disease -latex allergy -ongoing radiation therapy -sickle cell disease -skin reactions to acrylic adhesives (On-Body Injector only) -an unusual or allergic reaction to pegfilgrastim, filgrastim, other medicines, foods, dyes, or preservatives -pregnant or trying to get pregnant -breast-feeding How should I use this medicine? This medicine is for injection under the skin. If you get this medicine at home, you will be taught how to prepare and give the pre-filled syringe or how to use the On-body Injector. Refer to the patient Instructions for Use for detailed instructions. Use exactly as directed. Tell your healthcare provider immediately if you suspect that the On-body Injector may not have performed as intended or if you suspect the use of the On-body Injector resulted in a missed or partial dose. It is important that you put your used needles and syringes in a special sharps container. Do not put them in a trash can. If you do not have a sharps container, call your pharmacist or healthcare provider to get one. Talk to your pediatrician regarding the use of this medicine in children. While this drug may be prescribed for selected  conditions, precautions do apply. Overdosage: If you think you have taken too much of this medicine contact a poison control center or emergency room at once. NOTE: This medicine is only for you. Do not share this medicine with others. What if I miss a dose? It is important not to miss your dose. Call your doctor or health care professional if you miss your dose. If you miss a dose due to an On-body Injector failure or leakage, a new dose should be administered as soon as possible using a single prefilled syringe for manual use. What may interact with this medicine? Interactions have not been studied. Give your health care provider a list of all the medicines, herbs, non-prescription drugs, or dietary supplements you use. Also tell them if you smoke, drink alcohol, or use illegal drugs. Some items may interact with your medicine. This list may not describe all possible interactions. Give your health care provider a list of all the medicines, herbs, non-prescription drugs, or dietary supplements you use. Also tell them if you smoke, drink alcohol, or use illegal drugs. Some items may interact with your medicine. What should I watch for while using this medicine? You may need blood work done while you are taking this medicine. If you are going to need a MRI, CT scan, or other procedure, tell your doctor that you are using this medicine (On-Body Injector only). What side effects may I notice from receiving this medicine? Side effects that you should report to your doctor or health care professional as soon as possible: -allergic reactions like skin rash, itching or hives, swelling of the face, lips, or tongue -dizziness -fever -pain, redness, or irritation at  site where injected -pinpoint red spots on the skin -red or dark-brown urine -shortness of breath or breathing problems -stomach or side pain, or pain at the shoulder -swelling -tiredness -trouble passing urine or change in the amount of  urine Side effects that usually do not require medical attention (report to your doctor or health care professional if they continue or are bothersome): -bone pain -muscle pain This list may not describe all possible side effects. Call your doctor for medical advice about side effects. You may report side effects to FDA at 1-800-FDA-1088. Where should I keep my medicine? Keep out of the reach of children. Store pre-filled syringes in a refrigerator between 2 and 8 degrees C (36 and 46 degrees F). Do not freeze. Keep in carton to protect from light. Throw away this medicine if it is left out of the refrigerator for more than 48 hours. Throw away any unused medicine after the expiration date. NOTE: This sheet is a summary. It may not cover all possible information. If you have questions about this medicine, talk to your doctor, pharmacist, or health care provider.  2018 Elsevier/Gold Standard (2015-12-24 12:58:03)  

## 2017-12-24 NOTE — Progress Notes (Signed)
[place]   [phone] [phone]   Clinic Follow up Note   Patient Care Team: Reynold Bowen, MD as PCP - General (Endocrinology) Lorretta Harp, MD as PCP - Cardiology (Cardiology) 12/25/2017  CHIEF COMPLAINT: F/u on rectal cancer   SUMMARY OF ONCOLOGIC HISTORY: Oncology History   Cancer Staging Rectal cancer [facility]) Staging form: Colon and Rectum, AJCC 8th Edition - Clinical stage from 10/04/2017: Stage IIIB (cT3, cN1, cM0) - Signed by Truitt Merle, MD on 11/18/2017       Rectal cancer ([place])   10/04/2017 Procedure    Colonoscopy per Dr. Collene Mares 10/04/17 shows a large, non-obstructing horse-shoe shaped mass was found in the recto-sigmoid colon at 10 cm; the mass was partially circumferential involving one-half of the lumen circumference. The mass measured seven cm in length    10/04/2017 Initial Biopsy    Rectum, at 10-17 cm, mass biopsy -invasive adenocarcinoma, moderately differentiated  ICH stains for MLH1, MSH2, MSH6, and PMS2 are intact (normal)    10/04/2017 Cancer Staging    Staging form: Colon and Rectum, AJCC 8th Edition - Clinical stage from 10/04/2017: Stage IIIB (cT3, cN1, cM0) - Signed by Truitt Merle, MD on 11/18/2017    10/25/2017 Imaging    CT CAP IMPRESSION: 1. Irregular eccentric mass within the sigmoid colon. There are at least 3 abnormal appearing lymph node adjacent to the sigmoid colon at the level of the mass concerning for the possibility of local nodal metastatic disease. 2. Multiple pulmonary nodules as described above. These are indeterminate in etiology. Recommend attention on follow-up.    11/06/2017 Initial Diagnosis    Adenocarcinoma of colon ([place])    11/16/2017 Imaging    Pelvic MRI 11/16/17  IMPRESSION: Rectal adenocarcinoma T stage:   T3 Rectal adenocarcinoma N stage:  N1 Distance from tumor to the anal sphincter is 6.9 cm.    11/28/2017 -  Chemotherapy    totalneoadjuvant chemo FOLFOXq2 weeks x4 months,  starting 11/20, followed by concurrent chemoRT     CURRENT THERAPY totalneoadjuvant chemo FOLFOXq2 weeks x4 months, starting 11/20, followed by concurrent chemoRT   INTERVAL HISTORY: Nicole Sellers is a 67 y.o. female who is here for follow-up. Today, she is here alone. She is doing well and she is tolerating chemo with fatigue. She tries to drink boost supplements to stay active. She also complains of mucositis that makes it difficult to eat and drink. She tries to maintain a soft diet and use magic mouth wash. She experienced diarrhea with chemo, but it is controlled with medications. She denies blood in stool.   Pertinent positives and negatives of review of systems are listed and detailed within the above HPI.  REVIEW OF SYSTEMS:   Constitutional: Denies fevers, chills or abnormal weight loss (+) fatigue  Eyes: Denies blurriness of vision Ears, nose, mouth, throat, and face: (+) mucositis  Respiratory: Denies cough, dyspnea or wheezes Cardiovascular: Denies palpitation, chest discomfort or lower extremity swelling Gastrointestinal:  Denies nausea, heartburn (+) diarrhea, controlled Skin: Denies abnormal skin rashes Lymphatics: Denies new lymphadenopathy or easy bruising Neurological:Denies numbness, tingling or new weaknesses Behavioral/Psych: Mood is stable, no new changes  All other systems were reviewed with the patient and are negative.  MEDICAL HISTORY:  Past Medical History:  Diagnosis Date  . Allergy   . Anemia   . Arthritis    "joints sometimes" (12/25/2012)  . Clotting disorder ([place])   . Colon cancer ([place])   . Critical lower limb ischemia   .  Gangrene of toe, Rt second toe 12/25/2012  . GERD (gastroesophageal reflux disease)   . High cholesterol   . Hypertension   . Hypothyroidism   . PAD (peripheral artery disease) ([place])   . S/P arterial stent, 12/25/13, successful diamondback orbital rotational arthrectomy, PTA using chocolate  balloon and stenting using  I DEV stent of long segment calcified high-grade proximal and mid r 12/25/2012  . Tobacco abuse   . Type II diabetes mellitus ([place])     SURGICAL HISTORY: Past Surgical History:  Procedure Laterality Date  . ABDOMINAL AORTAGRAM  12/20/2012   Procedure: ABDOMINAL AORTAGRAM;  Surgeon: Lorretta Harp, MD;  Location: [facility] CATH LAB;  Service: Cardiovascular;;  . ABDOMINAL HYSTERECTOMY  2000  . ANGIOPLASTY / STENTING FEMORAL Right 12/25/2012  . ATHERECTOMY Right 12/25/2012   Procedure: ATHERECTOMY;  Surgeon: Lorretta Harp, MD;  Location: [facility] CATH LAB;  Service: Cardiovascular;  Laterality: Right;  right SFA  . HERNIA REPAIR    . LOWER EXTREMITY ANGIOGRAM N/A 12/20/2012   Procedure: LOWER EXTREMITY ANGIOGRAM;  Surgeon: Lorretta Harp, MD;  Location: [facility] CATH LAB;  Service: Cardiovascular;  Laterality: N/A;  . LOWER EXTREMITY ANGIOGRAM N/A 12/25/2012   Procedure: LOWER EXTREMITY ANGIOGRAM;  Surgeon: Lorretta Harp, MD;  Location: [facility] CATH LAB;  Service: Cardiovascular;  Laterality: N/A;  . LOWER EXTREMITY INTERVENTION  05/08/2017  . LOWER EXTREMITY INTERVENTION Bilateral 05/08/2017   Procedure: LOWER EXTREMITY INTERVENTION;  Surgeon: Lorretta Harp, MD;  Location: [place] CV LAB;  Service: Cardiovascular;  Laterality: Bilateral;  . PERIPHERAL VASCULAR BALLOON ANGIOPLASTY Right 05/08/2017   Procedure: PERIPHERAL VASCULAR BALLOON ANGIOPLASTY;  Surgeon: Lorretta Harp, MD;  Location: [place] CV LAB;  Service: Cardiovascular;  Laterality: Right;  sfa  . PERIPHERAL VASCULAR INTERVENTION Right 05/08/2017   Procedure: PERIPHERAL VASCULAR INTERVENTION;  Surgeon: Lorretta Harp, MD;  Location: [place] CV LAB;  Service: Cardiovascular;  Laterality: Right;  ext iliac  . PORTACATH PLACEMENT Right 11/22/2017   Procedure: INSERTION PORT-A-CATH;  Surgeon: Leighton Ruff, MD;  Location: [facility] ORS;  Service: General;  Laterality: Right;  . TOE SURGERY Right    2n toe   . UMBILICAL HERNIA  REPAIR  2000    I have reviewed the social history and family history with the patient and they are unchanged from previous note.  ALLERGIES:  is allergic to kiwi extract; lisinopril; and plavix [clopidogrel bisulfate].  MEDICATIONS:  Current Outpatient Medications  Medication Sig Dispense Refill  . acetaminophen (TYLENOL) 500 MG tablet Take 500 mg by mouth 2 (two) times daily as needed for moderate pain or headache.     . albuterol (PROVENTIL HFA;VENTOLIN HFA) 108 (90 BASE) MCG/ACT inhaler Inhale 2 puffs into the lungs every 6 (six) hours as needed for wheezing or shortness of breath.    Marland Kitchen amLODipine (NORVASC) 5 MG tablet Take 1 tablet (5 mg total) by mouth daily. 90 tablet 2  . aspirin 81 MG tablet Take 81 mg by mouth daily.    Marland Kitchen atorvastatin (LIPITOR) 10 MG tablet TAKE 1 TABLET BY MOUTH  EVERY DAY 90 tablet 0  . Calcium Carbonate-Vitamin D (CALCIUM 600+D PO) Take 1 tablet by mouth daily.    . cetirizine (ZYRTEC) 10 MG tablet Take 10 mg by mouth daily.    . cholecalciferol (VITAMIN D) 1000 units tablet Take 4,000 Units by mouth daily.     . clopidogrel (PLAVIX) 75 MG tablet Take 1 tablet (75 mg total) by mouth daily with breakfast. 90  tablet 3  . diphenoxylate-atropine (LOMOTIL) 2.5-0.025 MG tablet Take 1-2 tablets by mouth 4 (four) times daily as needed for diarrhea or loose stools. 40 tablet 2  . FERREX 150 150 MG capsule Take 150 mg by mouth 2 (two) times daily.  11  . gabapentin (NEURONTIN) 300 MG capsule Take 300 mg by mouth daily.     Marland Kitchen glimepiride (AMARYL) 1 MG tablet Take 1 mg by mouth 2 (two) times daily.    Marland Kitchen levothyroxine (SYNTHROID, LEVOTHROID) 50 MCG tablet TAKE 1 TABLET BY MOUTH  EVERY DAY 90 tablet 0  . lidocaine-prilocaine (EMLA) cream Apply to affected area once 30 g 3  . magic mouthwash w/lidocaine SOLN Take 5 mLs by mouth 3 (three) times daily. Swish and Spit 240 mL 0  . metFORMIN (GLUCOPHAGE-XR) 500 MG 24 hr tablet Take 1,000 mg by mouth 2 (two) times daily.    .  mirtazapine (REMERON) 7.5 MG tablet Take 1 tablet (7.5 mg total) by mouth at bedtime. If tolerating after 1 week, can increase to 2 tabs at night 60 tablet 1  . Misc Natural Products (OSTEO BI-FLEX ADV TRIPLE ST) TABS Take 1 tablet by mouth 2 (two) times daily.    . Multiple Vitamin (MULTIVITAMIN) capsule Take 1 capsule by mouth daily.    . ondansetron (ZOFRAN) 8 MG tablet Take 1 tablet (8 mg total) by mouth 2 (two) times daily as needed for refractory nausea / vomiting. Start on day 3 after chemotherapy. 30 tablet 1  . prochlorperazine (COMPAZINE) 10 MG tablet Take 1 tablet (10 mg total) by mouth every 6 (six) hours as needed (Nausea or vomiting). 30 tablet 1  . tetrahydrozoline-zinc (VISINE-AC) 0.05-0.25 % ophthalmic solution Place 1 drop into both eyes 2 (two) times daily as needed (dry eyes).    . Triamcinolone Acetonide (NASACORT ALLERGY 24HR NA) Place 1 spray into the nose daily as needed (allergies).    . triamterene-hydrochlorothiazide (MAXZIDE) 75-50 MG tablet Take 1 tablet by mouth daily.    Loura Pardon Salicylate (ASPERCREME EX) Apply 1 application topically daily as needed (joint pain).    . vitamin B-12 (CYANOCOBALAMIN) 500 MCG tablet Take 500 mcg by mouth daily.     No current facility-administered medications for this visit.     PHYSICAL EXAMINATION: ECOG PERFORMANCE STATUS: 1 - Symptomatic but completely ambulatory  Vitals:   12/25/17 1339  BP: 133/66  Pulse: 87  Resp: 18  Temp: 98.4 F (36.9 C)  SpO2: 99%   Filed Weights   12/25/17 1339  Weight: 162 lb 3.2 oz (73.6 kg)    GENERAL:alert, no distress and comfortable (+) walks with cane SKIN: skin color, texture, turgor are normal, no rashes or significant lesions EYES: normal, Conjunctiva are pink and non-injected, sclera clear OROPHARYNX:no exudate, no erythema and lips, buccal mucosa, and tongue normal  NECK: supple, thyroid normal size, non-tender, without nodularity LYMPH:  no palpable lymphadenopathy in the  cervical, axillary or inguinal LUNGS: clear to auscultation and percussion with normal breathing effort HEART: regular rate & rhythm and no murmurs and no lower extremity edema ABDOMEN:abdomen soft, non-tender and normal bowel sounds Rectum: (+) palpable stool, no palpable masses Musculoskeletal:no cyanosis of digits and no clubbing  NEURO: alert & oriented x 3 with fluent speech, no focal motor/sensory deficits  LABORATORY DATA:  I have reviewed the data as listed CBC Latest Ref Rng & Units 12/25/2017 12/12/2017 12/05/2017  WBC 4.0 - 10.5 K/uL 9.2 3.4(L) 3.8(L)  Hemoglobin 12.0 - 15.0 g/dL 9.7(L) 9.3(L)  9.0(L)  Hematocrit 36.0 - 46.0 % 30.3(L) 29.5(L) 28.2(L)  Platelets 150 - 400 K/uL 187 302 351     CMP Latest Ref Rng & Units 12/25/2017 12/12/2017 12/05/2017  Glucose 70 - 99 mg/dL 184(H) 146(H) 229(H)  BUN 8 - 23 mg/dL 7(L) 11 13  Creatinine 0.44 - 1.00 mg/dL 1.04(H) 1.16(H) 1.15(H)  Sodium 135 - 145 mmol/L 144 140 137  Potassium 3.5 - 5.1 mmol/L 4.4 3.8 3.8  Chloride 98 - 111 mmol/L 104 102 102  CO2 22 - 32 mmol/L '28 28 25  '$ Calcium 8.9 - 10.3 mg/dL 9.1 10.0 9.2  Total Protein 6.5 - 8.1 g/dL 6.1(L) 6.4(L) 6.4(L)  Total Bilirubin 0.3 - 1.2 mg/dL 0.3 0.3 0.3  Alkaline Phos 38 - 126 U/L 99 71 76  AST 15 - 41 U/L 27 23 13(L)  ALT 0 - 44 U/L '16 13 8      '$ RADIOGRAPHIC STUDIES: I have personally reviewed the radiological images as listed and agreed with the findings in the report. No results found.   ASSESSMENT & PLAN:  Nicole Sellers is a 67 y.o. female with history of  1. Adenocarcinoma of the rectosigmoid colon, moderately differentiated,cT3N1M0, stage IIIB,MMR normal -Diagnosed in 09/2017. She is on neoadjuvant chemo FOLFOX with udenyca. -she is tolerating well overall with mild fatigue, diarrhea, mucositis, and dehydration after chemo, recovered well. -She still complains of mild mouth and gum pain, no mucositis on exam. She uses magic mouth wash, which helps  -Labs  reviewed, CBC showed Hg 9.7. BG 184 Cr 1.04, adequate for treatment, will proceed cycle 3 FOLFOX today -No mouth ulcers on exam today. No palpable masses in rectum.  -I will repeat CT AP in 3-4 weeks to assess response to chemo. -f/u in 2 weeks for cycle 4   2. Iron deficiency anemia, secondary to #1 -She is currently on 2 oral iron pills a day. Continue. -She previously declined IV iron. -Labs reviewed, CBC showed Hg 9.7, which has improved   3. Depression -Due to her diagnosis and treatment. -Currently on mirtazapine.  4. Diarrhea -She initially complained of constipation that has changed to diarrhea after chemo. -She is currently on imodium as needed and diarrhea is controlled   5. Decreased appetite, weight loss -Currently on Mirtazapine. I also previously encouraged her to try Ensure supplements.  Plan  -Lab reviewed, adequate for chemo, will proceed to cycle 3 FOLFOX today  -f/u in 2 weeks with labs and chemo  -Will repeat CT scan w/ contrast in 3-4 weeks to assess response to chemo  No problem-specific Assessment & Plan notes found for this encounter.   Orders Placed This Encounter  Procedures  . CT Abdomen Pelvis W Contrast    Re-evaluate after neoadjuvant chemo    Standing Status:   Future    Standing Expiration Date:   12/25/2018    Order Specific Question:   If indicated for the ordered procedure, I authorize the administration of contrast media per Radiology protocol    Answer:   Yes    Order Specific Question:   Preferred imaging location?    Answer:   [facility]    Order Specific Question:   Is Oral Contrast requested for this exam?    Answer:   Yes, Per Radiology protocol    Order Specific Question:   Radiology Contrast Protocol - do NOT remove file path    Answer:   [email or web address]   All questions were answered. The patient knows to call  the clinic with any problems, questions or concerns. No barriers to learning was  detected. I spent 20 minutes counseling the patient face to face. The total time spent in the appointment was 25 minutes and more than 50% was on counseling and review of test results  I, Noor Dweik am acting as scribe for Dr. Truitt Merle.  I have reviewed the above documentation for accuracy and completeness, and I agree with the above.     Truitt Merle, MD 12/25/2017

## 2017-12-25 ENCOUNTER — Inpatient Hospital Stay (HOSPITAL_BASED_OUTPATIENT_CLINIC_OR_DEPARTMENT_OTHER): Payer: Medicare Other | Admitting: Hematology

## 2017-12-25 ENCOUNTER — Telehealth: Payer: Self-pay

## 2017-12-25 ENCOUNTER — Inpatient Hospital Stay: Payer: Medicare Other

## 2017-12-25 ENCOUNTER — Encounter: Payer: Self-pay | Admitting: Hematology

## 2017-12-25 VITALS — BP 133/66 | HR 87 | Temp 98.4°F | Resp 18 | Ht 66.0 in | Wt 162.2 lb

## 2017-12-25 DIAGNOSIS — R634 Abnormal weight loss: Secondary | ICD-10-CM

## 2017-12-25 DIAGNOSIS — I2510 Atherosclerotic heart disease of native coronary artery without angina pectoris: Secondary | ICD-10-CM

## 2017-12-25 DIAGNOSIS — Z5111 Encounter for antineoplastic chemotherapy: Secondary | ICD-10-CM | POA: Diagnosis not present

## 2017-12-25 DIAGNOSIS — Z79899 Other long term (current) drug therapy: Secondary | ICD-10-CM

## 2017-12-25 DIAGNOSIS — Z7689 Persons encountering health services in other specified circumstances: Secondary | ICD-10-CM

## 2017-12-25 DIAGNOSIS — K1230 Oral mucositis (ulcerative), unspecified: Secondary | ICD-10-CM

## 2017-12-25 DIAGNOSIS — I10 Essential (primary) hypertension: Secondary | ICD-10-CM

## 2017-12-25 DIAGNOSIS — I739 Peripheral vascular disease, unspecified: Secondary | ICD-10-CM

## 2017-12-25 DIAGNOSIS — E7800 Pure hypercholesterolemia, unspecified: Secondary | ICD-10-CM

## 2017-12-25 DIAGNOSIS — C20 Malignant neoplasm of rectum: Secondary | ICD-10-CM

## 2017-12-25 DIAGNOSIS — E785 Hyperlipidemia, unspecified: Secondary | ICD-10-CM

## 2017-12-25 DIAGNOSIS — F329 Major depressive disorder, single episode, unspecified: Secondary | ICD-10-CM

## 2017-12-25 DIAGNOSIS — D630 Anemia in neoplastic disease: Secondary | ICD-10-CM | POA: Diagnosis not present

## 2017-12-25 DIAGNOSIS — R197 Diarrhea, unspecified: Secondary | ICD-10-CM

## 2017-12-25 DIAGNOSIS — C19 Malignant neoplasm of rectosigmoid junction: Secondary | ICD-10-CM | POA: Diagnosis not present

## 2017-12-25 DIAGNOSIS — Z7982 Long term (current) use of aspirin: Secondary | ICD-10-CM

## 2017-12-25 DIAGNOSIS — M129 Arthropathy, unspecified: Secondary | ICD-10-CM

## 2017-12-25 DIAGNOSIS — R918 Other nonspecific abnormal finding of lung field: Secondary | ICD-10-CM

## 2017-12-25 DIAGNOSIS — E039 Hypothyroidism, unspecified: Secondary | ICD-10-CM

## 2017-12-25 DIAGNOSIS — Z7984 Long term (current) use of oral hypoglycemic drugs: Secondary | ICD-10-CM

## 2017-12-25 DIAGNOSIS — E119 Type 2 diabetes mellitus without complications: Secondary | ICD-10-CM

## 2017-12-25 DIAGNOSIS — R630 Anorexia: Secondary | ICD-10-CM

## 2017-12-25 DIAGNOSIS — E860 Dehydration: Secondary | ICD-10-CM

## 2017-12-25 DIAGNOSIS — K219 Gastro-esophageal reflux disease without esophagitis: Secondary | ICD-10-CM

## 2017-12-25 DIAGNOSIS — F17210 Nicotine dependence, cigarettes, uncomplicated: Secondary | ICD-10-CM

## 2017-12-25 LAB — CBC WITH DIFFERENTIAL (CANCER CENTER ONLY)
Abs Immature Granulocytes: 0.37 10*3/uL — ABNORMAL HIGH (ref 0.00–0.07)
Basophils Absolute: 0.1 10*3/uL (ref 0.0–0.1)
Basophils Relative: 1 %
Eosinophils Absolute: 0.2 10*3/uL (ref 0.0–0.5)
Eosinophils Relative: 2 %
HCT: 30.3 % — ABNORMAL LOW (ref 36.0–46.0)
Hemoglobin: 9.7 g/dL — ABNORMAL LOW (ref 12.0–15.0)
Immature Granulocytes: 4 %
Lymphocytes Relative: 25 %
Lymphs Abs: 2.3 10*3/uL (ref 0.7–4.0)
MCH: 28 pg (ref 26.0–34.0)
MCHC: 32 g/dL (ref 30.0–36.0)
MCV: 87.3 fL (ref 80.0–100.0)
Monocytes Absolute: 0.6 10*3/uL (ref 0.1–1.0)
Monocytes Relative: 6 %
Neutro Abs: 5.7 10*3/uL (ref 1.7–7.7)
Neutrophils Relative %: 62 %
Platelet Count: 187 10*3/uL (ref 150–400)
RBC: 3.47 MIL/uL — ABNORMAL LOW (ref 3.87–5.11)
RDW: 22.8 % — ABNORMAL HIGH (ref 11.5–15.5)
WBC Count: 9.2 10*3/uL (ref 4.0–10.5)
nRBC: 0.3 % — ABNORMAL HIGH (ref 0.0–0.2)

## 2017-12-25 LAB — CMP (CANCER CENTER ONLY)
ALT: 16 U/L (ref 0–44)
AST: 27 U/L (ref 15–41)
Albumin: 3.3 g/dL — ABNORMAL LOW (ref 3.5–5.0)
Alkaline Phosphatase: 99 U/L (ref 38–126)
Anion gap: 12 (ref 5–15)
BUN: 7 mg/dL — ABNORMAL LOW (ref 8–23)
CO2: 28 mmol/L (ref 22–32)
Calcium: 9.1 mg/dL (ref 8.9–10.3)
Chloride: 104 mmol/L (ref 98–111)
Creatinine: 1.04 mg/dL — ABNORMAL HIGH (ref 0.44–1.00)
GFR, Est AFR Am: >60 mL/min (ref >60)
GFR, Estimated: 55 mL/min — ABNORMAL LOW (ref >60)
Glucose, Bld: 184 mg/dL — ABNORMAL HIGH (ref 70–99)
Potassium: 4.4 mmol/L (ref 3.5–5.1)
Sodium: 144 mmol/L (ref 135–145)
Total Bilirubin: 0.3 mg/dL (ref 0.3–1.2)
Total Protein: 6.1 g/dL — ABNORMAL LOW (ref 6.5–8.1)

## 2017-12-25 NOTE — Telephone Encounter (Signed)
Printed avs and calender of upcoming appointment. Per 12/16 los 

## 2017-12-25 NOTE — Telephone Encounter (Signed)
none

## 2017-12-27 ENCOUNTER — Ambulatory Visit: Payer: Medicare Other | Admitting: Nurse Practitioner

## 2017-12-27 ENCOUNTER — Other Ambulatory Visit: Payer: Medicare Other

## 2017-12-27 ENCOUNTER — Inpatient Hospital Stay: Payer: Medicare Other

## 2017-12-27 ENCOUNTER — Other Ambulatory Visit: Payer: Self-pay | Admitting: Nurse Practitioner

## 2017-12-27 VITALS — BP 158/65 | HR 89 | Temp 98.5°F | Resp 18

## 2017-12-27 DIAGNOSIS — C19 Malignant neoplasm of rectosigmoid junction: Secondary | ICD-10-CM | POA: Diagnosis not present

## 2017-12-27 DIAGNOSIS — C20 Malignant neoplasm of rectum: Secondary | ICD-10-CM

## 2017-12-27 MED ORDER — SODIUM CHLORIDE 0.9 % IV SOLN
2400.0000 mg/m2 | INTRAVENOUS | Status: DC
  Administered 2017-12-27: 4550 mg via INTRAVENOUS
  Filled 2017-12-27: qty 91

## 2017-12-27 MED ORDER — DEXTROSE 5 % IV SOLN
Freq: Once | INTRAVENOUS | Status: AC
  Administered 2017-12-27: 11:00:00 via INTRAVENOUS
  Filled 2017-12-27: qty 250

## 2017-12-27 MED ORDER — PALONOSETRON HCL INJECTION 0.25 MG/5ML
INTRAVENOUS | Status: AC
  Filled 2017-12-27: qty 5

## 2017-12-27 MED ORDER — OXALIPLATIN CHEMO INJECTION 100 MG/20ML
85.0000 mg/m2 | Freq: Once | INTRAVENOUS | Status: AC
  Administered 2017-12-27: 160 mg via INTRAVENOUS
  Filled 2017-12-27: qty 32

## 2017-12-27 MED ORDER — LEUCOVORIN CALCIUM INJECTION 350 MG
400.0000 mg/m2 | Freq: Once | INTRAVENOUS | Status: AC
  Administered 2017-12-27: 760 mg via INTRAVENOUS
  Filled 2017-12-27: qty 38

## 2017-12-27 MED ORDER — DEXAMETHASONE SODIUM PHOSPHATE 10 MG/ML IJ SOLN
10.0000 mg | Freq: Once | INTRAMUSCULAR | Status: AC
  Administered 2017-12-27: 10 mg via INTRAVENOUS

## 2017-12-27 MED ORDER — DEXAMETHASONE SODIUM PHOSPHATE 10 MG/ML IJ SOLN
INTRAMUSCULAR | Status: AC
  Filled 2017-12-27: qty 1

## 2017-12-27 MED ORDER — PALONOSETRON HCL INJECTION 0.25 MG/5ML
0.2500 mg | Freq: Once | INTRAVENOUS | Status: AC
  Administered 2017-12-27: 0.25 mg via INTRAVENOUS

## 2017-12-27 NOTE — Patient Instructions (Signed)
[facility] Discharge Instructions for Patients Receiving Chemotherapy  Today you received the following chemotherapy agents Oxaliplatin, Leucovorin, 5FU  To help prevent nausea and vomiting after your treatment, we encourage you to take your nausea medication as directed   If you develop nausea and vomiting that is not controlled by your nausea medication, call the clinic.   BELOW ARE SYMPTOMS THAT SHOULD BE REPORTED IMMEDIATELY:  *FEVER GREATER THAN 100.5 F  *CHILLS WITH OR WITHOUT FEVER  NAUSEA AND VOMITING THAT IS NOT CONTROLLED WITH YOUR NAUSEA MEDICATION  *UNUSUAL SHORTNESS OF BREATH  *UNUSUAL BRUISING OR BLEEDING  TENDERNESS IN MOUTH AND THROAT WITH OR WITHOUT PRESENCE OF ULCERS  *URINARY PROBLEMS  *BOWEL PROBLEMS  UNUSUAL RASH Items with * indicate a potential emergency and should be followed up as soon as possible.  Feel free to call the clinic should you have any questions or concerns. The clinic phone number is [phone].  Please show the CHEMO ALERT CARD at check-in to the Emergency Department and triage nurse.   

## 2017-12-28 ENCOUNTER — Other Ambulatory Visit: Payer: Self-pay | Admitting: Nurse Practitioner

## 2017-12-29 ENCOUNTER — Inpatient Hospital Stay: Payer: Medicare Other

## 2017-12-29 VITALS — BP 140/59 | HR 87 | Temp 98.7°F | Resp 18

## 2017-12-29 DIAGNOSIS — C20 Malignant neoplasm of rectum: Secondary | ICD-10-CM

## 2017-12-29 DIAGNOSIS — C19 Malignant neoplasm of rectosigmoid junction: Secondary | ICD-10-CM | POA: Diagnosis not present

## 2017-12-29 MED ORDER — PEGFILGRASTIM-CBQV 6 MG/0.6ML ~~LOC~~ SOSY
6.0000 mg | PREFILLED_SYRINGE | Freq: Once | SUBCUTANEOUS | Status: AC
  Administered 2017-12-29: 6 mg via SUBCUTANEOUS

## 2017-12-29 MED ORDER — PEGFILGRASTIM-CBQV 6 MG/0.6ML ~~LOC~~ SOSY
PREFILLED_SYRINGE | SUBCUTANEOUS | Status: AC
  Filled 2017-12-29: qty 0.6

## 2017-12-29 MED ORDER — HEPARIN SOD (PORK) LOCK FLUSH 100 UNIT/ML IV SOLN
500.0000 [IU] | Freq: Once | INTRAVENOUS | Status: AC | PRN
  Administered 2017-12-29: 500 [IU]
  Filled 2017-12-29: qty 5

## 2017-12-29 MED ORDER — SODIUM CHLORIDE 0.9% FLUSH
10.0000 mL | INTRAVENOUS | Status: DC | PRN
  Administered 2017-12-29: 10 mL
  Filled 2017-12-29: qty 10

## 2018-01-01 ENCOUNTER — Other Ambulatory Visit: Payer: Self-pay | Admitting: Nurse Practitioner

## 2018-01-01 DIAGNOSIS — E119 Type 2 diabetes mellitus without complications: Secondary | ICD-10-CM

## 2018-01-01 DIAGNOSIS — E039 Hypothyroidism, unspecified: Secondary | ICD-10-CM

## 2018-01-01 MED ORDER — LEVOTHYROXINE SODIUM 50 MCG PO TABS: 50.0000 ug | ORAL_TABLET | Freq: Every day | ORAL | 1 refills | Status: DC

## 2018-01-01 MED ORDER — ATORVASTATIN CALCIUM 10 MG PO TABS: 10.0000 mg | ORAL_TABLET | Freq: Every day | ORAL | 1 refills | Status: DC

## 2018-01-02 ENCOUNTER — Telehealth: Payer: Self-pay

## 2018-01-02 NOTE — Telephone Encounter (Signed)
Left pt a v/m to call the office so we can schedule her an appointment at the beginning of the year due to her not being seen in a while and requesting refills. YRL,RMA

## 2018-01-05 ENCOUNTER — Encounter: Payer: Self-pay | Admitting: Cardiovascular Disease

## 2018-01-05 ENCOUNTER — Ambulatory Visit (INDEPENDENT_AMBULATORY_CARE_PROVIDER_SITE_OTHER): Payer: Medicare Other | Admitting: Cardiovascular Disease

## 2018-01-05 VITALS — BP 122/68 | HR 87 | Ht 66.5 in | Wt 169.0 lb

## 2018-01-05 DIAGNOSIS — I739 Peripheral vascular disease, unspecified: Secondary | ICD-10-CM

## 2018-01-05 DIAGNOSIS — I6522 Occlusion and stenosis of left carotid artery: Secondary | ICD-10-CM | POA: Diagnosis not present

## 2018-01-05 DIAGNOSIS — Z720 Tobacco use: Secondary | ICD-10-CM | POA: Diagnosis not present

## 2018-01-05 DIAGNOSIS — I10 Essential (primary) hypertension: Secondary | ICD-10-CM

## 2018-01-05 NOTE — Assessment & Plan Note (Signed)
History of carotid artery disease with Doppler performed 03/28/2017 revealing moderate left ICA stenosis.  This will be followed on an annual basis by duplex ultrasound.

## 2018-01-05 NOTE — Assessment & Plan Note (Signed)
History of discontinue tobacco abuse in 2016.

## 2018-01-05 NOTE — Progress Notes (Signed)
01/05/2018 Nicole Sellers   [birth year]  [id number]  Primary Physician Reynold Bowen, MD Primary Cardiologist: Lorretta Harp MD Lupe Carney, [place]  HPI:  Nicole Sellers is a 67 y.o.  mildly overweight towards [place] American female mother of one child and works as a [employer] manager. She was referred by Dr.Ajlouny from [facility] podiatry for evaluation of critical limb ischemia.I last saw her in the office  06/09/2017.Her risk factors include a 25-50-pack-year history of tobacco abusehaving quit in 2016.She has history of non-insulin-requiring diabetes untreated hypertension. She's never had a heart attack or stroke. She complains of dyspnea but denies chest pain. She's had claudication of less than 3 years which is lifestyle limiting and a gangrenous right second toe of the last 2-3 weeks. Dopplers her office suggested high-grade right SFA disease with an occluded anterior tibial as was the occluded left SFA.she underwent angiography several weeks ago revealing high-grade segmentally occluded and calcified proximal and mid right SFA stenosis with one vessel runoff via the peroneal. She underwent staged diamondback with rotational atherectomy, PTA and stenting of a long segment calcified subtotally occluded right SFA for critical limb ischemia (gangrenous right second toe). She will vessel runoff via the posterior tibial. Her Dopplers revealed increase in her ABIs from 0.77 To .93 post intervention. Resting pain has resolved. Her gangrenous toewas amputated by Dr. Fritzi Mandes. Since I saw her 3 years ago she started well until recently when she developed some right hip and buttock pain remained down the lateral aspect of her leg which sounds more like sciatica. Recent lower actually arterial Doppler studies reveal a decline in her right ABI from near normal down to 0.62 the high-frequency signal in her right iliac as well as mid right SFA within her stented segment. I performed peripheral  angiography on her 05/08/2017 revealing a 60% right external iliac artery stenosis which I stented as well as a subtotally occluded occluded long stented segment in the right SFA which I intervened on with drug-eluting balloons.  Her follow-up Dopplers improved and her symptoms did as well.  He did in addition have an occluded left SFA although she is asymptomatic on the side.  Since I saw her 6 months ago she is remained stable.  Her most recent lower extremity Dopplers performed 11/14/2017 revealed a right ABI 0.19 with a patent right iliac stent and SFA stent.  She denies claudication on that side.  She is otherwise stable and asymptomatic except for some swelling in her left ankle.   Current Meds  Medication Sig  . acetaminophen (TYLENOL) 500 MG tablet Take 500 mg by mouth 2 (two) times daily as needed for moderate pain or headache.   . albuterol (PROVENTIL HFA;VENTOLIN HFA) 108 (90 BASE) MCG/ACT inhaler Inhale 2 puffs into the lungs every 6 (six) hours as needed for wheezing or shortness of breath.  Marland Kitchen amLODipine (NORVASC) 5 MG tablet Take 1 tablet (5 mg total) by mouth daily.  Marland Kitchen aspirin 81 MG tablet Take 81 mg by mouth daily.  Marland Kitchen atorvastatin (LIPITOR) 10 MG tablet Take 1 tablet (10 mg total) by mouth daily.  . Calcium Carbonate-Vitamin D (CALCIUM 600+D PO) Take 1 tablet by mouth daily.  . cetirizine (ZYRTEC) 10 MG tablet Take 10 mg by mouth daily.  . cholecalciferol (VITAMIN D) 1000 units tablet Take 4,000 Units by mouth daily.   . clopidogrel (PLAVIX) 75 MG tablet Take 1 tablet (75 mg total) by mouth daily with breakfast.  . diphenoxylate-atropine (LOMOTIL) 2.5-0.025  MG tablet Take 1-2 tablets by mouth 4 (four) times daily as needed for diarrhea or loose stools.  Marland Kitchen FERREX 150 150 MG capsule Take 150 mg by mouth 2 (two) times daily.  Marland Kitchen gabapentin (NEURONTIN) 300 MG capsule Take 300 mg by mouth daily.   Marland Kitchen glimepiride (AMARYL) 1 MG tablet Take 1 mg by mouth 2 (two) times daily.  Marland Kitchen levothyroxine  (SYNTHROID, LEVOTHROID) 50 MCG tablet Take 1 tablet (50 mcg total) by mouth daily.  Marland Kitchen lidocaine-prilocaine (EMLA) cream Apply to affected area once  . magic mouthwash w/lidocaine SOLN Take 5 mLs by mouth 3 (three) times daily. Swish and Spit  . metFORMIN (GLUCOPHAGE-XR) 500 MG 24 hr tablet TAKE 2 TABLETS BY MOUTH  TWICE A DAY  . mirtazapine (REMERON) 7.5 MG tablet Take 1 tablet (7.5 mg total) by mouth at bedtime. If tolerating after 1 week, can increase to 2 tabs at night  . Misc Natural Products (OSTEO BI-FLEX ADV TRIPLE ST) TABS Take 1 tablet by mouth 2 (two) times daily.  . Multiple Vitamin (MULTIVITAMIN) capsule Take 1 capsule by mouth daily.  . ondansetron (ZOFRAN) 8 MG tablet Take 1 tablet (8 mg total) by mouth 2 (two) times daily as needed for refractory nausea / vomiting. Start on day 3 after chemotherapy.  . prochlorperazine (COMPAZINE) 10 MG tablet Take 1 tablet (10 mg total) by mouth every 6 (six) hours as needed (Nausea or vomiting).  Marland Kitchen tetrahydrozoline-zinc (VISINE-AC) 0.05-0.25 % ophthalmic solution Place 1 drop into both eyes 2 (two) times daily as needed (dry eyes).  . Triamcinolone Acetonide (NASACORT ALLERGY 24HR NA) Place 1 spray into the nose daily as needed (allergies).  . triamterene-hydrochlorothiazide (MAXZIDE) 75-50 MG tablet Take 1 tablet by mouth daily.  Loura Pardon Salicylate (ASPERCREME EX) Apply 1 application topically daily as needed (joint pain).  . vitamin B-12 (CYANOCOBALAMIN) 500 MCG tablet Take 500 mcg by mouth daily.     Allergies  Allergen Reactions  . Kiwi Extract Itching    Throat itching   . Lisinopril     Bruising   . Plavix [Clopidogrel Bisulfate]     Upset stomach, bloody stool, bruising     Social History   Socioeconomic History  . Marital status: Single    Spouse name: Not on file  . Number of children: 1  . Years of education: Not on file  . Highest education level: Not on file  Occupational History  . Occupation: CNA    Comment:  Retired   Scientific laboratory technician  . Financial resource strain: Not on file  . Food insecurity:    Worry: Not on file    Inability: Not on file  . Transportation needs:    Medical: Yes    Non-medical: Yes  Tobacco Use  . Smoking status: Former Smoker    Packs/day: 0.50    Years: 48.00    Pack years: 24.00    Types: Cigarettes  . Smokeless tobacco: Never Used  . Tobacco comment: quit in 2016  Substance and Sexual Activity  . Alcohol use: No    Alcohol/week: 0.0 standard drinks  . Drug use: No  . Sexual activity: Not Currently  Lifestyle  . Physical activity:    Days per week: Not on file    Minutes per session: Not on file  . Stress: Not on file  Relationships  . Social connections:    Talks on phone: Not on file    Gets together: Not on file    Attends religious  service: Not on file    Active member of club or organization: Not on file    Attends meetings of clubs or organizations: Not on file    Relationship status: Not on file  . Intimate partner violence:    Fear of current or ex partner: Not on file    Emotionally abused: Not on file    Physically abused: Not on file    Forced sexual activity: Not on file  Other Topics Concern  . Not on file  Social History Narrative  . Not on file     Review of Systems: General: negative for chills, fever, night sweats or weight changes.  Cardiovascular: negative for chest pain, dyspnea on exertion, edema, orthopnea, palpitations, paroxysmal nocturnal dyspnea or shortness of breath Dermatological: negative for rash Respiratory: negative for cough or wheezing Urologic: negative for hematuria Abdominal: negative for nausea, vomiting, diarrhea, bright red blood per rectum, melena, or hematemesis Neurologic: negative for visual changes, syncope, or dizziness All other systems reviewed and are otherwise negative except as noted above.    Blood pressure 122/68, pulse 87, height 5' 6.5" (1.689 m), weight 169 lb (76.7 kg).  General  appearance: alert and no distress Neck: no adenopathy, no JVD, supple, symmetrical, trachea midline, thyroid not enlarged, symmetric, no tenderness/mass/nodules and Soft left carotid bruit Lungs: clear to auscultation bilaterally Heart: regular rate and rhythm, S1, S2 normal, no murmur, click, rub or gallop Extremities: extremities normal, atraumatic, no cyanosis or edema Pulses: Diminished pedal pulses bilaterally Skin: Skin color, texture, turgor normal. No rashes or lesions Neurologic: Alert and oriented X 3, normal strength and tone. Normal symmetric reflexes. Normal coordination and gait  EKG sinus rhythm 87 with poor R wave progression and nonspecific ST and T wave changes.  I personally reviewed this EKG.  ASSESSMENT AND PLAN:   PAD (peripheral artery disease) Rt ABI 0.7, Lt ABI 0.46 History of peripheral arterial disease status post diamondback orbital rotational atherectomy, PTA and stenting of a long highly calcified subtotally occluded right SFA in the setting of critical limb ischemia with one-vessel runoff via the posterior tibial artery.  Her pain resolved and her gangrenous toe was successfully amputated by Dr. Fritzi Mandes.  Recurrent right hip pain and abnormal Doppler study I re-angiogram to her 05/08/2017 revealing a 60% right external iliac artery stenosis which I stented as well as diffuse in-stent restenosis with the previously placed right SFA stent which I re-intervened on as well with excellent result.  Her Dopplers and ABIs improved.  Her symptoms resolved and these has remained durable with Dopplers performed 11/16/2017 revealing a right ABI 0.92 with a patent iliac stent and SFA stent as well.  Essential hypertension History of essential hypertension her blood pressure measured today 122/68.  She is on Norvasc Maxide.  Continue current meds at current dosing.  Tobacco abuse History of discontinue tobacco abuse in 2016.  Carotid artery disease (HCC) History of carotid  artery disease with Doppler performed 03/28/2017 revealing moderate left ICA stenosis.  This will be followed on an annual basis by duplex ultrasound.      Lorretta Harp MD FACP,FACC,FAHA, [facility] 01/05/2018 10:41 AM

## 2018-01-05 NOTE — Assessment & Plan Note (Signed)
History of essential hypertension her blood pressure measured today 122/68.  She is on Norvasc Maxide.  Continue current meds at current dosing.

## 2018-01-05 NOTE — Patient Instructions (Signed)
Medication Instructions:  Your physician recommends that you continue on your current medications as directed. Please refer to the Current Medication list given to you today.  If you need a refill on your cardiac medications before your next appointment, please call your pharmacy.   Lab work: none If you have labs (blood work) drawn today and your tests are completely normal, you will receive your results only by: Marland Kitchen MyChart Message (if you have MyChart) OR . A paper copy in the mail If you have any lab test that is abnormal or we need to change your treatment, we will call you to review the results.  Testing/Procedures: Your physician has requested that you have a carotid duplex. This test is an ultrasound of the carotid arteries in your neck. It looks at blood flow through these arteries that supply the brain with blood. Allow one hour for this exam. There are no restrictions or special instructions.  Schedule for March 2020  Your physician has requested that you have a lower or upper extremity arterial duplex. This test is an ultrasound of the arteries in the legs or arms. It looks at arterial blood flow in the legs and arms. Allow one hour for Lower and Upper Arterial scans. There are no restrictions or special instructions  SCHEDULE November 2020 Your physician has requested that you have an ankle brachial index (ABI). During this test an ultrasound and blood pressure cuff are used to evaluate the arteries that supply the arms and legs with blood. Allow thirty minutes for this exam. There are no restrictions or special instructions.  SCHEDULE November 2020   Follow-Up: At [facility], you and your health needs are our priority.  As part of our continuing mission to provide you with exceptional heart care, we have created designated Provider Care Teams.  These Care Teams include your primary Cardiologist (physician) and Advanced Practice Providers (APPs -  Physician Assistants and  Nurse Practitioners) who all work together to provide you with the care you need, when you need it. You will need a follow up appointment in 12 months.  Please call our office 2 months in advance to schedule this appointment.  You may see Quay Burow, MD or one of the following Advanced Practice Providers on your designated Care Team:   Kerin Ransom, PA-C Roby Lofts, [place] . Sande Rives, PA-C

## 2018-01-05 NOTE — Assessment & Plan Note (Signed)
History of peripheral arterial disease status post diamondback orbital rotational atherectomy, PTA and stenting of a long highly calcified subtotally occluded right SFA in the setting of critical limb ischemia with one-vessel runoff via the posterior tibial artery.  Her pain resolved and her gangrenous toe was successfully amputated by Dr. Fritzi Mandes.  Recurrent right hip pain and abnormal Doppler study I re-angiogram to her 05/08/2017 revealing a 60% right external iliac artery stenosis which I stented as well as diffuse in-stent restenosis with the previously placed right SFA stent which I re-intervened on as well with excellent result.  Her Dopplers and ABIs improved.  Her symptoms resolved and these has remained durable with Dopplers performed 11/16/2017 revealing a right ABI 0.92 with a patent iliac stent and SFA stent as well.

## 2018-01-05 NOTE — Progress Notes (Signed)
[place]   Telephone:([phone]) [phone] Fax:(336) [phone]   Clinic Follow up Note   Patient Care Team: Reynold Bowen, MD as PCP - General (Endocrinology) Lorretta Harp, MD as PCP - Cardiology (Cardiology)  Date of Service:  01/08/2018  CHIEF COMPLAINT: F/u of Rectal cancer  SUMMARY OF ONCOLOGIC HISTORY: Oncology History   Cancer Staging Rectal cancer [facility]) Staging form: Colon and Rectum, AJCC 8th Edition - Clinical stage from 10/04/2017: Stage IIIB (cT3, cN1, cM0) - Signed by Truitt Merle, MD on 11/18/2017       Rectal cancer ([place])   10/04/2017 Procedure    Colonoscopy per Dr. Collene Mares 10/04/17 shows a large, non-obstructing horse-shoe shaped mass was found in the recto-sigmoid colon at 10 cm; the mass was partially circumferential involving one-half of the lumen circumference. The mass measured seven cm in length    10/04/2017 Initial Biopsy    Rectum, at 10-17 cm, mass biopsy -invasive adenocarcinoma, moderately differentiated  ICH stains for MLH1, MSH2, MSH6, and PMS2 are intact (normal)    10/04/2017 Cancer Staging    Staging form: Colon and Rectum, AJCC 8th Edition - Clinical stage from 10/04/2017: Stage IIIB (cT3, cN1, cM0) - Signed by Truitt Merle, MD on 11/18/2017    10/25/2017 Imaging    CT CAP IMPRESSION: 1. Irregular eccentric mass within the sigmoid colon. There are at least 3 abnormal appearing lymph node adjacent to the sigmoid colon at the level of the mass concerning for the possibility of local nodal metastatic disease. 2. Multiple pulmonary nodules as described above. These are indeterminate in etiology. Recommend attention on follow-up.    11/06/2017 Initial Diagnosis    Adenocarcinoma of colon ([place])    11/16/2017 Imaging    Pelvic MRI 11/16/17  IMPRESSION: Rectal adenocarcinoma T stage:   T3 Rectal adenocarcinoma N stage:  N1 Distance from tumor to the anal sphincter is 6.9 cm.    11/28/2017 -  Chemotherapy    totalneoadjuvant chemo FOLFOXq2  weeks x4 months, starting 11/20, followed by concurrent chemoRT      CURRENT THERAPY:  neoadjuvant chemo FOLFOXq2 weeks x4 months, starting 11/20, followed by concurrent chemoRT  INTERVAL HISTORY:  Nicole Sellers is here for a follow up and treatment. She presents to the clinic today by herself. She notes last cycle chemo was not as bad. She had cold sensitivity that would last for at least 7 days and resolves mostly but has mild numbness of her fingers.    REVIEW OF SYSTEMS:   Constitutional: Denies fevers, chills or abnormal weight loss Eyes: Denies blurriness of vision Ears, nose, mouth, throat, and face: Denies mucositis or sore throat Respiratory: Denies cough, dyspnea or wheezes Cardiovascular: Denies palpitation, chest discomfort or lower extremity swelling Gastrointestinal:  Denies nausea, heartburn or change in bowel habits Skin: Denies abnormal skin rashes Lymphatics: Denies new lymphadenopathy or easy bruising Neurological:Denies tingling or new weaknesses (+) mild numbness in fingers  Behavioral/Psych: Mood is stable, no new changes  All other systems were reviewed with the patient and are negative.  MEDICAL HISTORY:  Past Medical History:  Diagnosis Date  . Allergy   . Anemia   . Arthritis    "joints sometimes" (12/25/2012)  . Clotting disorder ([place])   . Colon cancer ([place])   . Critical lower limb ischemia   . Gangrene of toe, Rt second toe 12/25/2012  . GERD (gastroesophageal reflux disease)   . High cholesterol   . Hypertension   . Hypothyroidism   . PAD (peripheral  artery disease) ([place])   . S/P arterial stent, 12/25/13, successful diamondback orbital rotational arthrectomy, PTA using chocolate  balloon and stenting using I DEV stent of long segment calcified high-grade proximal and mid r 12/25/2012  . Tobacco abuse   . Type II diabetes mellitus ([place])     SURGICAL HISTORY: Past Surgical History:  Procedure Laterality Date  . ABDOMINAL AORTAGRAM   12/20/2012   Procedure: ABDOMINAL AORTAGRAM;  Surgeon: Lorretta Harp, MD;  Location[facility] CATH LAB;  Service: Cardiovascular;;  . ABDOMINAL HYSTERECTOMY  2000  . ANGIOPLASTY / STENTING FEMORAL Right 12/25/2012  . ATHERECTOMY Right 12/25/2012   Procedure: ATHERECTOMY;  Surgeon: Lorretta Harp, MD;  Location: [facility] CATH LAB;  Service: Cardiovascular;  Laterality: Right;  right SFA  . HERNIA REPAIR    . LOWER EXTREMITY ANGIOGRAM N/A 12/20/2012   Procedure: LOWER EXTREMITY ANGIOGRAM;  Surgeon: Lorretta Harp, MD;  Location: [facility] CATH LAB;  Service: Cardiovascular;  Laterality: N/A;  . LOWER EXTREMITY ANGIOGRAM N/A 12/25/2012   Procedure: LOWER EXTREMITY ANGIOGRAM;  Surgeon: Lorretta Harp, MD;  Location: [facility] CATH LAB;  Service: Cardiovascular;  Laterality: N/A;  . LOWER EXTREMITY INTERVENTION  05/08/2017  . LOWER EXTREMITY INTERVENTION Bilateral 05/08/2017   Procedure: LOWER EXTREMITY INTERVENTION;  Surgeon: Lorretta Harp, MD;  Location: [place] CV LAB;  Service: Cardiovascular;  Laterality: Bilateral;  . PERIPHERAL VASCULAR BALLOON ANGIOPLASTY Right 05/08/2017   Procedure: PERIPHERAL VASCULAR BALLOON ANGIOPLASTY;  Surgeon: Lorretta Harp, MD;  Location: [place] CV LAB;  Service: Cardiovascular;  Laterality: Right;  sfa  . PERIPHERAL VASCULAR INTERVENTION Right 05/08/2017   Procedure: PERIPHERAL VASCULAR INTERVENTION;  Surgeon: Lorretta Harp, MD;  Location: [place] CV LAB;  Service: Cardiovascular;  Laterality: Right;  ext iliac  . PORTACATH PLACEMENT Right 11/22/2017   Procedure: INSERTION PORT-A-CATH;  Surgeon: Leighton Ruff, MD;  Location: [facility] ORS;  Service: General;  Laterality: Right;  . TOE SURGERY Right    2n toe   . UMBILICAL HERNIA REPAIR  2000    I have reviewed the social history and family history with the patient and they are unchanged from previous note.  ALLERGIES:  is allergic to kiwi extract; lisinopril; and plavix [clopidogrel bisulfate].  MEDICATIONS:    Current Outpatient Medications  Medication Sig Dispense Refill  . acetaminophen (TYLENOL) 500 MG tablet Take 500 mg by mouth 2 (two) times daily as needed for moderate pain or headache.     . albuterol (PROVENTIL HFA;VENTOLIN HFA) 108 (90 BASE) MCG/ACT inhaler Inhale 2 puffs into the lungs every 6 (six) hours as needed for wheezing or shortness of breath.    Marland Kitchen amLODipine (NORVASC) 5 MG tablet Take 1 tablet (5 mg total) by mouth daily. 90 tablet 2  . aspirin 81 MG tablet Take 81 mg by mouth daily.    Marland Kitchen atorvastatin (LIPITOR) 10 MG tablet Take 1 tablet (10 mg total) by mouth daily. 90 tablet 1  . Calcium Carbonate-Vitamin D (CALCIUM 600+D PO) Take 1 tablet by mouth daily.    . cetirizine (ZYRTEC) 10 MG tablet Take 10 mg by mouth daily.    . cholecalciferol (VITAMIN D) 1000 units tablet Take 4,000 Units by mouth daily.     . clopidogrel (PLAVIX) 75 MG tablet Take 1 tablet (75 mg total) by mouth daily with breakfast. 90 tablet 3  . diphenoxylate-atropine (LOMOTIL) 2.5-0.025 MG tablet Take 1-2 tablets by mouth 4 (four) times daily as needed for diarrhea or loose stools. 40 tablet 2  .  FERREX 150 150 MG capsule Take 150 mg by mouth 2 (two) times daily.  11  . gabapentin (NEURONTIN) 300 MG capsule Take 300 mg by mouth daily.     Marland Kitchen glimepiride (AMARYL) 1 MG tablet Take 1 mg by mouth 2 (two) times daily.    Marland Kitchen levothyroxine (SYNTHROID, LEVOTHROID) 50 MCG tablet Take 1 tablet (50 mcg total) by mouth daily. 90 tablet 1  . lidocaine-prilocaine (EMLA) cream Apply to affected area once 30 g 3  . magic mouthwash w/lidocaine SOLN Take 5 mLs by mouth 3 (three) times daily. Swish and Spit 240 mL 0  . metFORMIN (GLUCOPHAGE-XR) 500 MG 24 hr tablet TAKE 2 TABLETS BY MOUTH  TWICE A DAY 60 tablet 0  . mirtazapine (REMERON) 7.5 MG tablet Take 1 tablet (7.5 mg total) by mouth at bedtime. If tolerating after 1 week, can increase to 2 tabs at night 60 tablet 1  . Misc Natural Products (OSTEO BI-FLEX ADV TRIPLE ST) TABS  Take 1 tablet by mouth 2 (two) times daily.    . Multiple Vitamin (MULTIVITAMIN) capsule Take 1 capsule by mouth daily.    . ondansetron (ZOFRAN) 8 MG tablet Take 1 tablet (8 mg total) by mouth 2 (two) times daily as needed for refractory nausea / vomiting. Start on day 3 after chemotherapy. 30 tablet 1  . prochlorperazine (COMPAZINE) 10 MG tablet Take 1 tablet (10 mg total) by mouth every 6 (six) hours as needed (Nausea or vomiting). 30 tablet 1  . tetrahydrozoline-zinc (VISINE-AC) 0.05-0.25 % ophthalmic solution Place 1 drop into both eyes 2 (two) times daily as needed (dry eyes).    . Triamcinolone Acetonide (NASACORT ALLERGY 24HR NA) Place 1 spray into the nose daily as needed (allergies).    . triamterene-hydrochlorothiazide (MAXZIDE) 75-50 MG tablet Take 1 tablet by mouth daily.    Loura Pardon Salicylate (ASPERCREME EX) Apply 1 application topically daily as needed (joint pain).    . vitamin B-12 (CYANOCOBALAMIN) 500 MCG tablet Take 500 mcg by mouth daily.     No current facility-administered medications for this visit.    Facility-Administered Medications Ordered in Other Visits  Medication Dose Route Frequency Provider Last Rate Last Dose  . heparin lock flush 100 unit/mL  500 Units Intracatheter Once PRN Cira Rue K, NP      . sodium chloride flush (NS) 0.9 % injection 10 mL  10 mL Intracatheter Once PRN Alla Feeling, NP        PHYSICAL EXAMINATION: ECOG PERFORMANCE STATUS: 1 - Symptomatic but completely ambulatory  Vitals:   01/08/18 1509  BP: (!) 151/77  Pulse: 85  Resp: 16  Temp: 98.3 F (36.8 C)  SpO2: 100%   Filed Weights   01/08/18 1509  Weight: 165 lb 8 oz (75.1 kg)    GENERAL:alert, no distress and comfortable SKIN: skin color, texture, turgor are normal, no rashes or significant lesions EYES: normal, Conjunctiva are pink and non-injected, sclera clear OROPHARYNX:no exudate, no erythema and lips, buccal mucosa, and tongue normal  NECK: supple, thyroid  normal size, non-tender, without nodularity LYMPH:  no palpable lymphadenopathy in the cervical, axillary or inguinal LUNGS: clear to auscultation and percussion with normal breathing effort HEART: regular rate & rhythm and no murmurs (+) Mild lower extremity edema, bilateral ABDOMEN:abdomen soft, non-tender and normal bowel sounds Musculoskeletal:no cyanosis of digits and no clubbing  NEURO: alert & oriented x 3 with fluent speech, no focal motor/sensory deficits  LABORATORY DATA:  I have reviewed the data as  listed CBC Latest Ref Rng & Units 01/08/2018 12/25/2017 12/12/2017  WBC 4.0 - 10.5 K/uL 10.4 9.2 3.4(L)  Hemoglobin 12.0 - 15.0 g/dL 9.6(L) 9.7(L) 9.3(L)  Hematocrit 36.0 - 46.0 % 29.6(L) 30.3(L) 29.5(L)  Platelets 150 - 400 K/uL 197 187 302     CMP Latest Ref Rng & Units 12/25/2017 12/12/2017 12/05/2017  Glucose 70 - 99 mg/dL 184(H) 146(H) 229(H)  BUN 8 - 23 mg/dL 7(L) 11 13  Creatinine 0.44 - 1.00 mg/dL 1.04(H) 1.16(H) 1.15(H)  Sodium 135 - 145 mmol/L 144 140 137  Potassium 3.5 - 5.1 mmol/L 4.4 3.8 3.8  Chloride 98 - 111 mmol/L 104 102 102  CO2 22 - 32 mmol/L _0 Calcium 8.9 - 10.3 mg/dL 9.1 10.0 9.2  Total Protein 6.5 - 8.1 g/dL 6.1(L) 6.4(L) 6.4(L)  Total Bilirubin 0.3 - 1.2 mg/dL 0.3 0.3 0.3  Alkaline Phos 38 - 126 U/L 99 71 76  AST 15 - 41 U/L 27 23 13(L)  ALT 0 - 44 U/L _1 RADIOGRAPHIC STUDIES: I have personally reviewed the radiological images as listed and agreed with the findings in the report. No results found.   ASSESSMENT & PLAN:  GIRL SCHISSLER is a 67 y.o. female with   1. Adenocarcinoma of the rectosigmoid colon, moderately differentiated,cT3N1M0, stage IIIB,MMR normal -Diagnosed in 09/2017. She is on neoadjuvant chemo FOLFOX with udenyca, plan for 4 months followed by chemoRT and surgery  -she is tolerating well overall with mild fatigue, controlled diarrhea, low appetite and mild numbness in fingers from cold sensitivity. Recovered  well. -Labs reviewed, hg at 9.6, otherwise CBC WNL. CMP, CEA and iron panel are still pending.Will proceed with treatment tomorrow.  -I encouraged her to elevate her feet for b/l LE mild edema.  -Plan for CT AP on 01/17/17  -f/u in 2 weeks  2. Iron deficiency anemia, secondary to #1 -She is currently on 2 oral iron pills a day. Continue. -She previously declined IV iron. -Labs reviewed, CBC showed Hg stable at 9.7 (01/08/18)  3. Depression -Due to her diagnosis and treatment. -Currently on mirtazapine. -Stable   4. Diarrhea -She initially complained of constipation that has changed to diarrhea after chemo. -She is currently on imodium as needed and diarrhea is controlled   5. Decreased appetite, weight loss -Currently on Mirtazapine. I again encouraged her to try Ensure supplements. -Weight currently stable but appetite remains low   Plan  -Lab reviewed, adequate for chemo, will proceed with cycle 4 FOLFOX tomorrow  -CT CAP on 1/16 to evaluate response to chemo  -Lab, flush, f/u and FOLFOX in 2 weeks     No problem-specific Assessment & Plan notes found for this encounter.   No orders of the defined types were placed in this encounter.  All questions were answered. The patient knows to call the clinic with any problems, questions or concerns. No barriers to learning was detected. I spent 20 minutes counseling the patient face to face. The total time spent in the appointment was 25 minutes and more than 50% was on counseling and review of test results     Truitt Merle, MD 01/08/2018   I, Joslyn Devon, am acting as scribe for Truitt Merle, MD.   I have reviewed the above documentation for accuracy and completeness, and I agree with the above.

## 2018-01-08 ENCOUNTER — Inpatient Hospital Stay (HOSPITAL_BASED_OUTPATIENT_CLINIC_OR_DEPARTMENT_OTHER): Payer: Medicare Other | Admitting: Hematology

## 2018-01-08 ENCOUNTER — Encounter: Payer: Self-pay | Admitting: Hematology

## 2018-01-08 ENCOUNTER — Inpatient Hospital Stay: Payer: Medicare Other

## 2018-01-08 VITALS — BP 151/77 | HR 85 | Temp 98.3°F | Resp 16 | Ht 66.0 in | Wt 165.5 lb

## 2018-01-08 DIAGNOSIS — I2510 Atherosclerotic heart disease of native coronary artery without angina pectoris: Secondary | ICD-10-CM

## 2018-01-08 DIAGNOSIS — D630 Anemia in neoplastic disease: Secondary | ICD-10-CM | POA: Diagnosis not present

## 2018-01-08 DIAGNOSIS — Z7689 Persons encountering health services in other specified circumstances: Secondary | ICD-10-CM | POA: Diagnosis not present

## 2018-01-08 DIAGNOSIS — E860 Dehydration: Secondary | ICD-10-CM

## 2018-01-08 DIAGNOSIS — R200 Anesthesia of skin: Secondary | ICD-10-CM

## 2018-01-08 DIAGNOSIS — F329 Major depressive disorder, single episode, unspecified: Secondary | ICD-10-CM

## 2018-01-08 DIAGNOSIS — E119 Type 2 diabetes mellitus without complications: Secondary | ICD-10-CM

## 2018-01-08 DIAGNOSIS — F17210 Nicotine dependence, cigarettes, uncomplicated: Secondary | ICD-10-CM

## 2018-01-08 DIAGNOSIS — Z7984 Long term (current) use of oral hypoglycemic drugs: Secondary | ICD-10-CM

## 2018-01-08 DIAGNOSIS — K1230 Oral mucositis (ulcerative), unspecified: Secondary | ICD-10-CM

## 2018-01-08 DIAGNOSIS — M129 Arthropathy, unspecified: Secondary | ICD-10-CM

## 2018-01-08 DIAGNOSIS — I739 Peripheral vascular disease, unspecified: Secondary | ICD-10-CM

## 2018-01-08 DIAGNOSIS — C20 Malignant neoplasm of rectum: Secondary | ICD-10-CM | POA: Diagnosis not present

## 2018-01-08 DIAGNOSIS — Z7982 Long term (current) use of aspirin: Secondary | ICD-10-CM

## 2018-01-08 DIAGNOSIS — Z79899 Other long term (current) drug therapy: Secondary | ICD-10-CM

## 2018-01-08 DIAGNOSIS — C189 Malignant neoplasm of colon, unspecified: Secondary | ICD-10-CM

## 2018-01-08 DIAGNOSIS — R197 Diarrhea, unspecified: Secondary | ICD-10-CM

## 2018-01-08 DIAGNOSIS — Z5111 Encounter for antineoplastic chemotherapy: Secondary | ICD-10-CM | POA: Diagnosis not present

## 2018-01-08 DIAGNOSIS — R918 Other nonspecific abnormal finding of lung field: Secondary | ICD-10-CM

## 2018-01-08 DIAGNOSIS — I10 Essential (primary) hypertension: Secondary | ICD-10-CM

## 2018-01-08 DIAGNOSIS — C19 Malignant neoplasm of rectosigmoid junction: Secondary | ICD-10-CM | POA: Diagnosis not present

## 2018-01-08 DIAGNOSIS — K219 Gastro-esophageal reflux disease without esophagitis: Secondary | ICD-10-CM

## 2018-01-08 DIAGNOSIS — R634 Abnormal weight loss: Secondary | ICD-10-CM

## 2018-01-08 DIAGNOSIS — E7800 Pure hypercholesterolemia, unspecified: Secondary | ICD-10-CM

## 2018-01-08 DIAGNOSIS — E785 Hyperlipidemia, unspecified: Secondary | ICD-10-CM

## 2018-01-08 DIAGNOSIS — E039 Hypothyroidism, unspecified: Secondary | ICD-10-CM

## 2018-01-08 DIAGNOSIS — R630 Anorexia: Secondary | ICD-10-CM

## 2018-01-08 LAB — CBC WITH DIFFERENTIAL (CANCER CENTER ONLY)
Abs Immature Granulocytes: 0.13 10*3/uL — ABNORMAL HIGH (ref 0.00–0.07)
Basophils Absolute: 0.1 10*3/uL (ref 0.0–0.1)
Basophils Relative: 1 %
Eosinophils Absolute: 0.2 10*3/uL (ref 0.0–0.5)
Eosinophils Relative: 1 %
HCT: 29.6 % — ABNORMAL LOW (ref 36.0–46.0)
Hemoglobin: 9.6 g/dL — ABNORMAL LOW (ref 12.0–15.0)
Immature Granulocytes: 1 %
Lymphocytes Relative: 19 %
Lymphs Abs: 2 10*3/uL (ref 0.7–4.0)
MCH: 28.9 pg (ref 26.0–34.0)
MCHC: 32.4 g/dL (ref 30.0–36.0)
MCV: 89.2 fL (ref 80.0–100.0)
Monocytes Absolute: 0.8 10*3/uL (ref 0.1–1.0)
Monocytes Relative: 7 %
Neutro Abs: 7.3 10*3/uL (ref 1.7–7.7)
Neutrophils Relative %: 71 %
Platelet Count: 197 10*3/uL (ref 150–400)
RBC: 3.32 MIL/uL — ABNORMAL LOW (ref 3.87–5.11)
RDW: 22 % — ABNORMAL HIGH (ref 11.5–15.5)
WBC Count: 10.4 10*3/uL (ref 4.0–10.5)
nRBC: 0 % (ref 0.0–0.2)

## 2018-01-08 LAB — CMP (CANCER CENTER ONLY)
ALT: 17 U/L (ref 0–44)
AST: 23 U/L (ref 15–41)
Albumin: 3 g/dL — ABNORMAL LOW (ref 3.5–5.0)
Alkaline Phosphatase: 130 U/L — ABNORMAL HIGH (ref 38–126)
Anion gap: 10 (ref 5–15)
BUN: 8 mg/dL (ref 8–23)
CO2: 28 mmol/L (ref 22–32)
Calcium: 9 mg/dL (ref 8.9–10.3)
Chloride: 102 mmol/L (ref 98–111)
Creatinine: 0.98 mg/dL (ref 0.44–1.00)
GFR, Est AFR Am: >60 mL/min (ref >60)
GFR, Estimated: 59 mL/min — ABNORMAL LOW (ref >60)
Glucose, Bld: 175 mg/dL — ABNORMAL HIGH (ref 70–99)
Potassium: 3.9 mmol/L (ref 3.5–5.1)
Sodium: 140 mmol/L (ref 135–145)
Total Bilirubin: 0.3 mg/dL (ref 0.3–1.2)
Total Protein: 6.3 g/dL — ABNORMAL LOW (ref 6.5–8.1)

## 2018-01-08 MED ORDER — SODIUM CHLORIDE 0.9% FLUSH
10.0000 mL | Freq: Once | INTRAVENOUS | Status: AC | PRN
  Filled 2018-01-08: qty 10

## 2018-01-08 MED ORDER — HEPARIN SOD (PORK) LOCK FLUSH 100 UNIT/ML IV SOLN
500.0000 [IU] | Freq: Once | INTRAVENOUS | Status: AC | PRN
  Filled 2018-01-08: qty 5

## 2018-01-09 ENCOUNTER — Inpatient Hospital Stay: Payer: Medicare Other

## 2018-01-09 ENCOUNTER — Encounter: Payer: Self-pay | Admitting: Hematology

## 2018-01-09 ENCOUNTER — Telehealth: Payer: Self-pay | Admitting: Hematology

## 2018-01-09 VITALS — BP 159/64 | HR 86 | Temp 98.7°F | Resp 18 | Wt 165.5 lb

## 2018-01-09 DIAGNOSIS — C20 Malignant neoplasm of rectum: Secondary | ICD-10-CM

## 2018-01-09 DIAGNOSIS — C19 Malignant neoplasm of rectosigmoid junction: Secondary | ICD-10-CM | POA: Diagnosis not present

## 2018-01-09 LAB — FERRITIN: Ferritin: 425 ng/mL — ABNORMAL HIGH (ref 11–307)

## 2018-01-09 LAB — IRON AND TIBC
Iron: 82 ug/dL (ref 41–142)
Saturation Ratios: 35 % (ref 21–57)
TIBC: 238 ug/dL (ref 236–444)
UIBC: 155 ug/dL (ref 120–384)

## 2018-01-09 LAB — CEA (IN HOUSE-CHCC): CEA (CHCC-In House): 19 ng/mL — ABNORMAL HIGH (ref 0.00–5.00)

## 2018-01-09 MED ORDER — SODIUM CHLORIDE 0.9 % IV SOLN
2400.0000 mg/m2 | INTRAVENOUS | Status: DC
  Administered 2018-01-09: 4550 mg via INTRAVENOUS
  Filled 2018-01-09: qty 91

## 2018-01-09 MED ORDER — SODIUM CHLORIDE 0.9% FLUSH
10.0000 mL | INTRAVENOUS | Status: DC | PRN
  Filled 2018-01-09: qty 10

## 2018-01-09 MED ORDER — HEPARIN SOD (PORK) LOCK FLUSH 100 UNIT/ML IV SOLN
500.0000 [IU] | Freq: Once | INTRAVENOUS | Status: DC | PRN
  Filled 2018-01-09: qty 5

## 2018-01-09 MED ORDER — PALONOSETRON HCL INJECTION 0.25 MG/5ML
0.2500 mg | Freq: Once | INTRAVENOUS | Status: AC
  Administered 2018-01-09: 0.25 mg via INTRAVENOUS

## 2018-01-09 MED ORDER — DEXTROSE 5 % IV SOLN
Freq: Once | INTRAVENOUS | Status: AC
  Administered 2018-01-09: 08:00:00 via INTRAVENOUS
  Filled 2018-01-09: qty 250

## 2018-01-09 MED ORDER — DEXAMETHASONE SODIUM PHOSPHATE 10 MG/ML IJ SOLN
10.0000 mg | Freq: Once | INTRAMUSCULAR | Status: AC
  Administered 2018-01-09: 10 mg via INTRAVENOUS

## 2018-01-09 MED ORDER — DEXAMETHASONE SODIUM PHOSPHATE 10 MG/ML IJ SOLN
INTRAMUSCULAR | Status: AC
  Filled 2018-01-09: qty 1

## 2018-01-09 MED ORDER — PALONOSETRON HCL INJECTION 0.25 MG/5ML
INTRAVENOUS | Status: AC
  Filled 2018-01-09: qty 5

## 2018-01-09 MED ORDER — OXALIPLATIN CHEMO INJECTION 100 MG/20ML
85.0000 mg/m2 | Freq: Once | INTRAVENOUS | Status: AC
  Administered 2018-01-09: 160 mg via INTRAVENOUS
  Filled 2018-01-09: qty 32

## 2018-01-09 MED ORDER — LEUCOVORIN CALCIUM INJECTION 350 MG
400.0000 mg/m2 | Freq: Once | INTRAVENOUS | Status: AC
  Administered 2018-01-09: 760 mg via INTRAVENOUS
  Filled 2018-01-09: qty 38

## 2018-01-09 NOTE — Patient Instructions (Signed)
[facility] Discharge Instructions for Patients Receiving Chemotherapy  Today you received the following chemotherapy agents Oxaliplatin (ELOXATIN), Leucovorin & Flourouracil (ADRUCIL).  To help prevent nausea and vomiting after your treatment, we encourage you to take your nausea medication as prescribed.   If you develop nausea and vomiting that is not controlled by your nausea medication, call the clinic.   BELOW ARE SYMPTOMS THAT SHOULD BE REPORTED IMMEDIATELY:  *FEVER GREATER THAN 100.5 F  *CHILLS WITH OR WITHOUT FEVER  NAUSEA AND VOMITING THAT IS NOT CONTROLLED WITH YOUR NAUSEA MEDICATION  *UNUSUAL SHORTNESS OF BREATH  *UNUSUAL BRUISING OR BLEEDING  TENDERNESS IN MOUTH AND THROAT WITH OR WITHOUT PRESENCE OF ULCERS  *URINARY PROBLEMS  *BOWEL PROBLEMS  UNUSUAL RASH Items with * indicate a potential emergency and should be followed up as soon as possible.  Feel free to call the clinic should you have any questions or concerns. The clinic phone number is [phone].  Please show the CHEMO ALERT CARD at check-in to the Emergency Department and triage nurse.   

## 2018-01-09 NOTE — Telephone Encounter (Signed)
No los per 12/30. °

## 2018-01-11 ENCOUNTER — Inpatient Hospital Stay: Payer: Medicare Other | Attending: Nurse Practitioner

## 2018-01-11 VITALS — BP 148/68 | HR 82 | Temp 98.7°F | Resp 18

## 2018-01-11 DIAGNOSIS — K5900 Constipation, unspecified: Secondary | ICD-10-CM | POA: Insufficient documentation

## 2018-01-11 DIAGNOSIS — I10 Essential (primary) hypertension: Secondary | ICD-10-CM | POA: Insufficient documentation

## 2018-01-11 DIAGNOSIS — Z5111 Encounter for antineoplastic chemotherapy: Secondary | ICD-10-CM | POA: Insufficient documentation

## 2018-01-11 DIAGNOSIS — Z7689 Persons encountering health services in other specified circumstances: Secondary | ICD-10-CM | POA: Insufficient documentation

## 2018-01-11 DIAGNOSIS — R200 Anesthesia of skin: Secondary | ICD-10-CM | POA: Insufficient documentation

## 2018-01-11 DIAGNOSIS — R634 Abnormal weight loss: Secondary | ICD-10-CM | POA: Insufficient documentation

## 2018-01-11 DIAGNOSIS — R630 Anorexia: Secondary | ICD-10-CM | POA: Diagnosis not present

## 2018-01-11 DIAGNOSIS — K5730 Diverticulosis of large intestine without perforation or abscess without bleeding: Secondary | ICD-10-CM | POA: Diagnosis not present

## 2018-01-11 DIAGNOSIS — F329 Major depressive disorder, single episode, unspecified: Secondary | ICD-10-CM | POA: Diagnosis not present

## 2018-01-11 DIAGNOSIS — D689 Coagulation defect, unspecified: Secondary | ICD-10-CM | POA: Diagnosis not present

## 2018-01-11 DIAGNOSIS — Z79899 Other long term (current) drug therapy: Secondary | ICD-10-CM | POA: Diagnosis not present

## 2018-01-11 DIAGNOSIS — R918 Other nonspecific abnormal finding of lung field: Secondary | ICD-10-CM | POA: Insufficient documentation

## 2018-01-11 DIAGNOSIS — Z7984 Long term (current) use of oral hypoglycemic drugs: Secondary | ICD-10-CM | POA: Diagnosis not present

## 2018-01-11 DIAGNOSIS — D509 Iron deficiency anemia, unspecified: Secondary | ICD-10-CM | POA: Insufficient documentation

## 2018-01-11 DIAGNOSIS — E039 Hypothyroidism, unspecified: Secondary | ICD-10-CM | POA: Insufficient documentation

## 2018-01-11 DIAGNOSIS — R197 Diarrhea, unspecified: Secondary | ICD-10-CM | POA: Diagnosis not present

## 2018-01-11 DIAGNOSIS — C19 Malignant neoplasm of rectosigmoid junction: Secondary | ICD-10-CM | POA: Insufficient documentation

## 2018-01-11 DIAGNOSIS — E1151 Type 2 diabetes mellitus with diabetic peripheral angiopathy without gangrene: Secondary | ICD-10-CM | POA: Insufficient documentation

## 2018-01-11 DIAGNOSIS — R110 Nausea: Secondary | ICD-10-CM | POA: Insufficient documentation

## 2018-01-11 DIAGNOSIS — E7800 Pure hypercholesterolemia, unspecified: Secondary | ICD-10-CM | POA: Diagnosis not present

## 2018-01-11 DIAGNOSIS — Z7982 Long term (current) use of aspirin: Secondary | ICD-10-CM | POA: Diagnosis not present

## 2018-01-11 DIAGNOSIS — M129 Arthropathy, unspecified: Secondary | ICD-10-CM | POA: Insufficient documentation

## 2018-01-11 DIAGNOSIS — C20 Malignant neoplasm of rectum: Secondary | ICD-10-CM

## 2018-01-11 DIAGNOSIS — R5383 Other fatigue: Secondary | ICD-10-CM | POA: Insufficient documentation

## 2018-01-11 DIAGNOSIS — K219 Gastro-esophageal reflux disease without esophagitis: Secondary | ICD-10-CM | POA: Diagnosis not present

## 2018-01-11 MED ORDER — HEPARIN SOD (PORK) LOCK FLUSH 100 UNIT/ML IV SOLN
500.0000 [IU] | Freq: Once | INTRAVENOUS | Status: AC | PRN
  Administered 2018-01-11: 500 [IU]
  Filled 2018-01-11: qty 5

## 2018-01-11 MED ORDER — SODIUM CHLORIDE 0.9% FLUSH
10.0000 mL | INTRAVENOUS | Status: DC | PRN
  Administered 2018-01-11: 10 mL
  Filled 2018-01-11: qty 10

## 2018-01-11 MED ORDER — PEGFILGRASTIM-CBQV 6 MG/0.6ML ~~LOC~~ SOSY
6.0000 mg | PREFILLED_SYRINGE | Freq: Once | SUBCUTANEOUS | Status: AC
  Administered 2018-01-11: 6 mg via SUBCUTANEOUS

## 2018-01-11 MED ORDER — PEGFILGRASTIM-CBQV 6 MG/0.6ML ~~LOC~~ SOSY
PREFILLED_SYRINGE | SUBCUTANEOUS | Status: AC
  Filled 2018-01-11: qty 0.6

## 2018-01-11 NOTE — Patient Instructions (Signed)
Pegfilgrastim injection What is this medicine? PEGFILGRASTIM (PEG fil gra stim) is a long-acting granulocyte colony-stimulating factor that stimulates the growth of neutrophils, a type of white blood cell important in the body's fight against infection. It is used to reduce the incidence of fever and infection in patients with certain types of cancer who are receiving chemotherapy that affects the bone marrow, and to increase survival after being exposed to high doses of radiation. This medicine may be used for other purposes; ask your health care provider or pharmacist if you have questions. COMMON BRAND NAME(S): Neulasta, Udencya What should I tell my health care provider before I take this medicine? They need to know if you have any of these conditions: -kidney disease -latex allergy -ongoing radiation therapy -sickle cell disease -skin reactions to acrylic adhesives (On-Body Injector only) -an unusual or allergic reaction to pegfilgrastim, filgrastim, other medicines, foods, dyes, or preservatives -pregnant or trying to get pregnant -breast-feeding How should I use this medicine? This medicine is for injection under the skin. If you get this medicine at home, you will be taught how to prepare and give the pre-filled syringe or how to use the On-body Injector. Refer to the patient Instructions for Use for detailed instructions. Use exactly as directed. Tell your healthcare provider immediately if you suspect that the On-body Injector may not have performed as intended or if you suspect the use of the On-body Injector resulted in a missed or partial dose. It is important that you put your used needles and syringes in a special sharps container. Do not put them in a trash can. If you do not have a sharps container, call your pharmacist or healthcare provider to get one. Talk to your pediatrician regarding the use of this medicine in children. While this drug may be prescribed for selected  conditions, precautions do apply. Overdosage: If you think you have taken too much of this medicine contact a poison control center or emergency room at once. NOTE: This medicine is only for you. Do not share this medicine with others. What if I miss a dose? It is important not to miss your dose. Call your doctor or health care professional if you miss your dose. If you miss a dose due to an On-body Injector failure or leakage, a new dose should be administered as soon as possible using a single prefilled syringe for manual use. What may interact with this medicine? Interactions have not been studied. Give your health care provider a list of all the medicines, herbs, non-prescription drugs, or dietary supplements you use. Also tell them if you smoke, drink alcohol, or use illegal drugs. Some items may interact with your medicine. This list may not describe all possible interactions. Give your health care provider a list of all the medicines, herbs, non-prescription drugs, or dietary supplements you use. Also tell them if you smoke, drink alcohol, or use illegal drugs. Some items may interact with your medicine. What should I watch for while using this medicine? You may need blood work done while you are taking this medicine. If you are going to need a MRI, CT scan, or other procedure, tell your doctor that you are using this medicine (On-Body Injector only). What side effects may I notice from receiving this medicine? Side effects that you should report to your doctor or health care professional as soon as possible: -allergic reactions like skin rash, itching or hives, swelling of the face, lips, or tongue -dizziness -fever -pain, redness, or irritation at  site where injected -pinpoint red spots on the skin -red or dark-brown urine -shortness of breath or breathing problems -stomach or side pain, or pain at the shoulder -swelling -tiredness -trouble passing urine or change in the amount of  urine Side effects that usually do not require medical attention (report to your doctor or health care professional if they continue or are bothersome): -bone pain -muscle pain This list may not describe all possible side effects. Call your doctor for medical advice about side effects. You may report side effects to FDA at 1-800-FDA-1088. Where should I keep my medicine? Keep out of the reach of children. Store pre-filled syringes in a refrigerator between 2 and 8 degrees C (36 and 46 degrees F). Do not freeze. Keep in carton to protect from light. Throw away this medicine if it is left out of the refrigerator for more than 48 hours. Throw away any unused medicine after the expiration date. NOTE: This sheet is a summary. It may not cover all possible information. If you have questions about this medicine, talk to your doctor, pharmacist, or health care provider.  2018 Elsevier/Gold Standard (2015-12-24 12:58:03)  

## 2018-01-17 ENCOUNTER — Ambulatory Visit (HOSPITAL_COMMUNITY)
Admission: RE | Admit: 2018-01-17 | Discharge: 2018-01-17 | Disposition: A | Discharge status: Home / Self Care | Payer: Medicare Other | Source: Ambulatory Visit | Attending: Hematology | Admitting: Hematology

## 2018-01-17 DIAGNOSIS — C20 Malignant neoplasm of rectum: Secondary | ICD-10-CM | POA: Insufficient documentation

## 2018-01-17 MED ORDER — SODIUM CHLORIDE (PF) 0.9 % IJ SOLN
INTRAMUSCULAR | Status: AC
  Filled 2018-01-17: qty 50

## 2018-01-17 MED ORDER — IOHEXOL 300 MG/ML  SOLN
100.0000 mL | Freq: Once | INTRAMUSCULAR | Status: AC | PRN
  Administered 2018-01-17: 100 mL via INTRAVENOUS

## 2018-01-22 NOTE — Progress Notes (Signed)
[place]   Telephone:([phone]) [phone] Fax:([phone]) [phone]   Clinic Follow up Note   Patient Care Team: Reynold Bowen, MD as PCP - General (Endocrinology) Lorretta Harp, MD as PCP - Cardiology (Cardiology)  Date of Service:  01/24/2018  CHIEF COMPLAINT: F/u of Rectal cancer  SUMMARY OF ONCOLOGIC HISTORY: Oncology History   Cancer Staging Rectal cancer [facility]) Staging form: Colon and Rectum, AJCC 8th Edition - Clinical stage from 10/04/2017: Stage IIIB (cT3, cN1, cM0) - Signed by Truitt Merle, MD on 11/18/2017       Rectal cancer ([place])   10/04/2017 Procedure    Colonoscopy per Dr. Collene Mares 10/04/17 shows a large, non-obstructing horse-shoe shaped mass was found in the recto-sigmoid colon at 10 cm; the mass was partially circumferential involving one-half of the lumen circumference. The mass measured seven cm in length    10/04/2017 Initial Biopsy    Rectum, at 10-17 cm, mass biopsy -invasive adenocarcinoma, moderately differentiated  ICH stains for MLH1, MSH2, MSH6, and PMS2 are intact (normal)    10/04/2017 Cancer Staging    Staging form: Colon and Rectum, AJCC 8th Edition - Clinical stage from 10/04/2017: Stage IIIB (cT3, cN1, cM0) - Signed by Truitt Merle, MD on 11/18/2017    10/25/2017 Imaging    CT CAP IMPRESSION: 1. Irregular eccentric mass within the sigmoid colon. There are at least 3 abnormal appearing lymph node adjacent to the sigmoid colon at the level of the mass concerning for the possibility of local nodal metastatic disease. 2. Multiple pulmonary nodules as described above. These are indeterminate in etiology. Recommend attention on follow-up.    11/06/2017 Initial Diagnosis    Adenocarcinoma of colon ([place])    11/16/2017 Imaging    Pelvic MRI 11/16/17  IMPRESSION: Rectal adenocarcinoma T stage:   T3 Rectal adenocarcinoma N stage:  N1 Distance from tumor to the anal sphincter is 6.9 cm.    11/28/2017 -  Chemotherapy    totalneoadjuvant chemo FOLFOXq2  weeks x4 months, starting 11/20, followed by concurrent chemoRT    01/17/2018 Imaging    CT AP W Contrast 01/17/18 IMPRESSION: 1. Previously seen soft tissue mass involving the left lateral wall the upper rectum is no longer visualized. 2. Decreased size of left perirectal lymph nodes since prior exam. 3. No other sites of metastatic disease within the abdomen or pelvis. 4. Colonic diverticulosis, without radiographic evidence of diverticulitis. 5. Stable small adjacent paraumbilical hernias containing only fat.      CURRENT THERAPY:  neoadjuvant chemo FOLFOXq2 weeks x4 months, starting 11/20, followed by concurrent chemoRT  INTERVAL HISTORY:  Nicole Sellers is here for a follow up and treatment. She presents to the clinic today by herself. She notes after last cycle chemo she felt tired but still able to function and do activities. She notes she is up most of the time. She lost 8 pounds since last month and she notes having lower appetite. She has not been drinking any supplements.       REVIEW OF SYSTEMS:   Constitutional: Denies fevers, chills (+) Low appetite, weight loss (+) fatigue  Eyes: Denies blurriness of vision Ears, nose, mouth, throat, and face: Denies mucositis or sore throat Respiratory: Denies cough, dyspnea or wheezes Cardiovascular: Denies palpitation, chest discomfort or lower extremity swelling Gastrointestinal:  Denies nausea, heartburn or change in bowel habits Skin: Denies abnormal skin rashes Lymphatics: Denies new lymphadenopathy or easy bruising Neurological:Denies numbness, tingling or new weaknesses Behavioral/Psych: Mood is stable, no new changes  All  other systems were reviewed with the patient and are negative.  MEDICAL HISTORY:  Past Medical History:  Diagnosis Date  . Allergy   . Anemia   . Arthritis    "joints sometimes" (12/25/2012)  . Clotting disorder ([place])   . Colon cancer ([place])   . Critical lower limb ischemia   . Gangrene of  toe, Rt second toe 12/25/2012  . GERD (gastroesophageal reflux disease)   . High cholesterol   . Hypertension   . Hypothyroidism   . PAD (peripheral artery disease) ([place])   . S/P arterial stent, 12/25/13, successful diamondback orbital rotational arthrectomy, PTA using chocolate  balloon and stenting using I DEV stent of long segment calcified high-grade proximal and mid r 12/25/2012  . Tobacco abuse   . Type II diabetes mellitus ([place])     SURGICAL HISTORY: Past Surgical History:  Procedure Laterality Date  . ABDOMINAL AORTAGRAM  12/20/2012   Procedure: ABDOMINAL AORTAGRAM;  Surgeon: Lorretta Harp, MD;  Location: [facility] CATH LAB;  Service: Cardiovascular;;  . ABDOMINAL HYSTERECTOMY  2000  . ANGIOPLASTY / STENTING FEMORAL Right 12/25/2012  . ATHERECTOMY Right 12/25/2012   Procedure: ATHERECTOMY;  Surgeon: Lorretta Harp, MD;  Location: [facility] CATH LAB;  Service: Cardiovascular;  Laterality: Right;  right SFA  . HERNIA REPAIR    . LOWER EXTREMITY ANGIOGRAM N/A 12/20/2012   Procedure: LOWER EXTREMITY ANGIOGRAM;  Surgeon: Lorretta Harp, MD;  Location: [facility] CATH LAB;  Service: Cardiovascular;  Laterality: N/A;  . LOWER EXTREMITY ANGIOGRAM N/A 12/25/2012   Procedure: LOWER EXTREMITY ANGIOGRAM;  Surgeon: Lorretta Harp, MD;  Location: [facility] CATH LAB;  Service: Cardiovascular;  Laterality: N/A;  . LOWER EXTREMITY INTERVENTION  05/08/2017  . LOWER EXTREMITY INTERVENTION Bilateral 05/08/2017   Procedure: LOWER EXTREMITY INTERVENTION;  Surgeon: Lorretta Harp, MD;  Location: [place] CV LAB;  Service: Cardiovascular;  Laterality: Bilateral;  . PERIPHERAL VASCULAR BALLOON ANGIOPLASTY Right 05/08/2017   Procedure: PERIPHERAL VASCULAR BALLOON ANGIOPLASTY;  Surgeon: Lorretta Harp, MD;  Location: [place] CV LAB;  Service: Cardiovascular;  Laterality: Right;  sfa  . PERIPHERAL VASCULAR INTERVENTION Right 05/08/2017   Procedure: PERIPHERAL VASCULAR INTERVENTION;  Surgeon: Lorretta Harp, MD;   Location: [place] CV LAB;  Service: Cardiovascular;  Laterality: Right;  ext iliac  . PORTACATH PLACEMENT Right 11/22/2017   Procedure: INSERTION PORT-A-CATH;  Surgeon: Leighton Ruff, MD;  Location: [facility] ORS;  Service: General;  Laterality: Right;  . TOE SURGERY Right    2n toe   . UMBILICAL HERNIA REPAIR  2000    I have reviewed the social history and family history with the patient and they are unchanged from previous note.  ALLERGIES:  is allergic to kiwi extract; lisinopril; and plavix [clopidogrel bisulfate].  MEDICATIONS:  Current Outpatient Medications  Medication Sig Dispense Refill  . acetaminophen (TYLENOL) 500 MG tablet Take 500 mg by mouth 2 (two) times daily as needed for moderate pain or headache.     . albuterol (PROVENTIL HFA;VENTOLIN HFA) 108 (90 BASE) MCG/ACT inhaler Inhale 2 puffs into the lungs every 6 (six) hours as needed for wheezing or shortness of breath.    Marland Kitchen amLODipine (NORVASC) 5 MG tablet Take 1 tablet (5 mg total) by mouth daily. 90 tablet 2  . aspirin 81 MG tablet Take 81 mg by mouth daily.    Marland Kitchen atorvastatin (LIPITOR) 10 MG tablet Take 1 tablet (10 mg total) by mouth daily. 90 tablet 1  . Calcium Carbonate-Vitamin D (CALCIUM 600+D PO)  Take 1 tablet by mouth daily.    . cetirizine (ZYRTEC) 10 MG tablet Take 10 mg by mouth daily.    . cholecalciferol (VITAMIN D) 1000 units tablet Take 4,000 Units by mouth daily.     . clopidogrel (PLAVIX) 75 MG tablet Take 1 tablet (75 mg total) by mouth daily with breakfast. 90 tablet 3  . diphenoxylate-atropine (LOMOTIL) 2.5-0.025 MG tablet Take 1-2 tablets by mouth 4 (four) times daily as needed for diarrhea or loose stools. 40 tablet 2  . FERREX 150 150 MG capsule Take 150 mg by mouth 2 (two) times daily.  11  . gabapentin (NEURONTIN) 300 MG capsule Take 300 mg by mouth daily.     Marland Kitchen glimepiride (AMARYL) 1 MG tablet Take 1 mg by mouth 2 (two) times daily.    Marland Kitchen levothyroxine (SYNTHROID, LEVOTHROID) 50 MCG tablet Take 1  tablet (50 mcg total) by mouth daily. 90 tablet 1  . lidocaine-prilocaine (EMLA) cream Apply to affected area once 30 g 3  . magic mouthwash w/lidocaine SOLN Take 5 mLs by mouth 3 (three) times daily. Swish and Spit 240 mL 0  . metFORMIN (GLUCOPHAGE-XR) 500 MG 24 hr tablet TAKE 2 TABLETS BY MOUTH  TWICE A DAY 60 tablet 0  . mirtazapine (REMERON) 7.5 MG tablet Take 1 tablet (7.5 mg total) by mouth at bedtime. If tolerating after 1 week, can increase to 2 tabs at night 60 tablet 1  . Misc Natural Products (OSTEO BI-FLEX ADV TRIPLE ST) TABS Take 1 tablet by mouth 2 (two) times daily.    . Multiple Vitamin (MULTIVITAMIN) capsule Take 1 capsule by mouth daily.    . ondansetron (ZOFRAN) 8 MG tablet Take 1 tablet (8 mg total) by mouth 2 (two) times daily as needed for refractory nausea / vomiting. Start on day 3 after chemotherapy. 30 tablet 1  . prochlorperazine (COMPAZINE) 10 MG tablet Take 1 tablet (10 mg total) by mouth every 6 (six) hours as needed (Nausea or vomiting). 30 tablet 1  . tetrahydrozoline-zinc (VISINE-AC) 0.05-0.25 % ophthalmic solution Place 1 drop into both eyes 2 (two) times daily as needed (dry eyes).    . Triamcinolone Acetonide (NASACORT ALLERGY 24HR NA) Place 1 spray into the nose daily as needed (allergies).    . triamterene-hydrochlorothiazide (MAXZIDE) 75-50 MG tablet Take 1 tablet by mouth daily.    Loura Pardon Salicylate (ASPERCREME EX) Apply 1 application topically daily as needed (joint pain).    . vitamin B-12 (CYANOCOBALAMIN) 500 MCG tablet Take 500 mcg by mouth daily.     No current facility-administered medications for this visit.    Facility-Administered Medications Ordered in Other Visits  Medication Dose Route Frequency Provider Last Rate Last Dose  . heparin lock flush 100 unit/mL  500 Units Intracatheter Once PRN Cira Rue K, NP      . sodium chloride flush (NS) 0.9 % injection 10 mL  10 mL Intracatheter Once PRN Alla Feeling, NP        PHYSICAL  EXAMINATION: ECOG PERFORMANCE STATUS: 1 - Symptomatic but completely ambulatory  Vitals:   01/24/18 0845  BP: (!) 147/71  Pulse: 88  Resp: 18  Temp: 98.1 F (36.7 C)  SpO2: 100%   Filed Weights   01/24/18 0845  Weight: 161 lb 6.4 oz (73.2 kg)    GENERAL:alert, no distress and comfortable SKIN: skin color, texture, turgor are normal, no rashes or significant lesions EYES: normal, Conjunctiva are pink and non-injected, sclera clear OROPHARYNX:no exudate, no erythema  and lips, buccal mucosa, and tongue normal  NECK: supple, thyroid normal size, non-tender, without nodularity LYMPH:  no palpable lymphadenopathy in the cervical, axillary or inguinal LUNGS: clear to auscultation and percussion with normal breathing effort HEART: regular rate & rhythm and no murmurs and no lower extremity edema ABDOMEN:abdomen soft, non-tender and normal bowel sounds Musculoskeletal:no cyanosis of digits and no clubbing  NEURO: alert & oriented x 3 with fluent speech, no focal motor/sensory deficits  LABORATORY DATA:  I have reviewed the data as listed CBC Latest Ref Rng & Units 01/24/2018 01/08/2018 12/25/2017  WBC 4.0 - 10.5 K/uL 10.2 10.4 9.2  Hemoglobin 12.0 - 15.0 g/dL 10.3(L) 9.6(L) 9.7(L)  Hematocrit 36.0 - 46.0 % 31.0(L) 29.6(L) 30.3(L)  Platelets 150 - 400 K/uL 268 197 187     CMP Latest Ref Rng & Units 01/24/2018 01/08/2018 12/25/2017  Glucose 70 - 99 mg/dL 174(H) 175(H) 184(H)  BUN 8 - 23 mg/dL 12 8 7(L)  Creatinine 0.44 - 1.00 mg/dL 1.18(H) 0.98 1.04(H)  Sodium 135 - 145 mmol/L 141 140 144  Potassium 3.5 - 5.1 mmol/L 3.5 3.9 4.4  Chloride 98 - 111 mmol/L 102 102 104  CO2 22 - 32 mmol/L '27 28 28  '$ Calcium 8.9 - 10.3 mg/dL 9.3 9.0 9.1  Total Protein 6.5 - 8.1 g/dL 6.5 6.3(L) 6.1(L)  Total Bilirubin 0.3 - 1.2 mg/dL 0.3 0.3 0.3  Alkaline Phos 38 - 126 U/L 141(H) 130(H) 99  AST 15 - 41 U/L '26 23 27  '$ ALT 0 - 44 U/L '17 17 16      '$ RADIOGRAPHIC STUDIES: I have personally reviewed  the radiological images as listed and agreed with the findings in the report. No results found.   ASSESSMENT & PLAN:  Nicole Sellers is a 68 y.o. female with    1. Adenocarcinoma of the rectosigmoid colon, moderately differentiated,cT3N1M0, stage IIIB,MMR normal -Diagnosed in 09/2017.She is on neoadjuvantchemo FOLFOXwithudenyca, plan for 4 months followed by chemoRT and surgery  -she is toleratingwell overallwith mildfatigue, controlled diarrhea, low appetite and mild numbness in fingers from cold sensitivity. Recovered well. -We discussed her restaging CT AP on 01/17/18 which shows tumor in rectal area has reduced, so do the regional lymph nodes. Overall good response to treatment.  No other new lesions.  I have reviewed the image personally, and discussed with patient.  She was quite pleased. Will continue with treatment plan.  -Labs reviewed, Hg improved to 10.3, CBC otherwise stable. CMP, iron panel and CEA are still pending. Her CEA has been trending down. Overall adequate to proceed with FOLFOX today -I encouraged her to work on maintaining her weight and staying active.  -f/u in 2 weeks  2. Iron deficiency anemia, secondary to #1 -She is currently on2oral ironpills a day. Continue. -She previously declined IV iron. -Labs reviewed,CBC showed Hg improved to 10.3 (01/24/2018)   3. Depression -Due to her diagnosis and treatment. -Currently on mirtazapine. -Stable   4. Diarrhea -She initially complained of constipation that has changed to diarrheaafter chemo. -She is currently on imodium as neededand diarrhea is controlled  5. Decreased appetite, weight loss -Currently on Mirtazapine. -She has lost 8 pounds since last month due to lower appetite.  -I again encouraged her to try Ensure supplements in addition to eat more.  -I will refer her to dietician   Plan -CT scan reviewed, she has had a good response to neoadjuvant chemo  -lab reviewed, adequate cycle 5  FOLFOX today and continue every 2 weeks -  Dietician referral due to her weight loss -Follow-up in 2 weeks    No problem-specific Assessment & Plan notes found for this encounter.   Orders Placed This Encounter  Procedures  . Amb Referral to Nutrition and Diabetic E    Referral Priority:   Routine    Referral Type:   Consultation    Referral Reason:   Specialty Services Required    Number of Visits Requested:   1   All questions were answered. The patient knows to call the clinic with any problems, questions or concerns. No barriers to learning was detected. I spent 25 minutes counseling the patient face to face. The total time spent in the appointment was 30 minutes and more than 50% was on counseling and review of test results     Truitt Merle, MD 01/24/2018   I, Joslyn Devon, am acting as scribe for Truitt Merle, MD.   I have reviewed the above documentation for accuracy and completeness, and I agree with the above.

## 2018-01-24 ENCOUNTER — Inpatient Hospital Stay: Payer: Medicare Other

## 2018-01-24 ENCOUNTER — Telehealth: Payer: Self-pay | Admitting: Hematology

## 2018-01-24 ENCOUNTER — Inpatient Hospital Stay (HOSPITAL_BASED_OUTPATIENT_CLINIC_OR_DEPARTMENT_OTHER): Payer: Medicare Other | Admitting: Hematology

## 2018-01-24 ENCOUNTER — Encounter: Payer: Self-pay | Admitting: Hematology

## 2018-01-24 VITALS — BP 147/71 | HR 88 | Temp 98.1°F | Resp 18 | Ht 66.0 in | Wt 161.4 lb

## 2018-01-24 DIAGNOSIS — K219 Gastro-esophageal reflux disease without esophagitis: Secondary | ICD-10-CM

## 2018-01-24 DIAGNOSIS — R5383 Other fatigue: Secondary | ICD-10-CM

## 2018-01-24 DIAGNOSIS — K5730 Diverticulosis of large intestine without perforation or abscess without bleeding: Secondary | ICD-10-CM

## 2018-01-24 DIAGNOSIS — D689 Coagulation defect, unspecified: Secondary | ICD-10-CM

## 2018-01-24 DIAGNOSIS — Z7984 Long term (current) use of oral hypoglycemic drugs: Secondary | ICD-10-CM

## 2018-01-24 DIAGNOSIS — R918 Other nonspecific abnormal finding of lung field: Secondary | ICD-10-CM | POA: Diagnosis not present

## 2018-01-24 DIAGNOSIS — Z5111 Encounter for antineoplastic chemotherapy: Secondary | ICD-10-CM

## 2018-01-24 DIAGNOSIS — D509 Iron deficiency anemia, unspecified: Secondary | ICD-10-CM

## 2018-01-24 DIAGNOSIS — Z79899 Other long term (current) drug therapy: Secondary | ICD-10-CM

## 2018-01-24 DIAGNOSIS — Z7689 Persons encountering health services in other specified circumstances: Secondary | ICD-10-CM

## 2018-01-24 DIAGNOSIS — E039 Hypothyroidism, unspecified: Secondary | ICD-10-CM

## 2018-01-24 DIAGNOSIS — E1151 Type 2 diabetes mellitus with diabetic peripheral angiopathy without gangrene: Secondary | ICD-10-CM

## 2018-01-24 DIAGNOSIS — R200 Anesthesia of skin: Secondary | ICD-10-CM

## 2018-01-24 DIAGNOSIS — C19 Malignant neoplasm of rectosigmoid junction: Secondary | ICD-10-CM | POA: Diagnosis not present

## 2018-01-24 DIAGNOSIS — M129 Arthropathy, unspecified: Secondary | ICD-10-CM

## 2018-01-24 DIAGNOSIS — Z7982 Long term (current) use of aspirin: Secondary | ICD-10-CM

## 2018-01-24 DIAGNOSIS — C20 Malignant neoplasm of rectum: Secondary | ICD-10-CM

## 2018-01-24 DIAGNOSIS — I739 Peripheral vascular disease, unspecified: Secondary | ICD-10-CM

## 2018-01-24 DIAGNOSIS — R634 Abnormal weight loss: Secondary | ICD-10-CM

## 2018-01-24 DIAGNOSIS — K5900 Constipation, unspecified: Secondary | ICD-10-CM

## 2018-01-24 DIAGNOSIS — C189 Malignant neoplasm of colon, unspecified: Secondary | ICD-10-CM

## 2018-01-24 DIAGNOSIS — R630 Anorexia: Secondary | ICD-10-CM

## 2018-01-24 DIAGNOSIS — F329 Major depressive disorder, single episode, unspecified: Secondary | ICD-10-CM

## 2018-01-24 DIAGNOSIS — E119 Type 2 diabetes mellitus without complications: Secondary | ICD-10-CM

## 2018-01-24 DIAGNOSIS — I10 Essential (primary) hypertension: Secondary | ICD-10-CM

## 2018-01-24 DIAGNOSIS — E7800 Pure hypercholesterolemia, unspecified: Secondary | ICD-10-CM

## 2018-01-24 LAB — CMP (CANCER CENTER ONLY)
ALT: 17 U/L (ref 0–44)
AST: 26 U/L (ref 15–41)
Albumin: 3.2 g/dL — ABNORMAL LOW (ref 3.5–5.0)
Alkaline Phosphatase: 141 U/L — ABNORMAL HIGH (ref 38–126)
Anion gap: 12 (ref 5–15)
BUN: 12 mg/dL (ref 8–23)
CO2: 27 mmol/L (ref 22–32)
Calcium: 9.3 mg/dL (ref 8.9–10.3)
Chloride: 102 mmol/L (ref 98–111)
Creatinine: 1.18 mg/dL — ABNORMAL HIGH (ref 0.44–1.00)
GFR, Est AFR Am: 54 mL/min — ABNORMAL LOW (ref >60)
GFR, Estimated: 47 mL/min — ABNORMAL LOW (ref >60)
Glucose, Bld: 174 mg/dL — ABNORMAL HIGH (ref 70–99)
Potassium: 3.5 mmol/L (ref 3.5–5.1)
Sodium: 141 mmol/L (ref 135–145)
Total Bilirubin: 0.3 mg/dL (ref 0.3–1.2)
Total Protein: 6.5 g/dL (ref 6.5–8.1)

## 2018-01-24 LAB — CBC WITH DIFFERENTIAL (CANCER CENTER ONLY)
Abs Immature Granulocytes: 0.05 10*3/uL (ref 0.00–0.07)
Basophils Absolute: 0.1 10*3/uL (ref 0.0–0.1)
Basophils Relative: 1 %
Eosinophils Absolute: 0.4 10*3/uL (ref 0.0–0.5)
Eosinophils Relative: 3 %
HCT: 31 % — ABNORMAL LOW (ref 36.0–46.0)
Hemoglobin: 10.3 g/dL — ABNORMAL LOW (ref 12.0–15.0)
Immature Granulocytes: 1 %
Lymphocytes Relative: 20 %
Lymphs Abs: 2.1 10*3/uL (ref 0.7–4.0)
MCH: 30 pg (ref 26.0–34.0)
MCHC: 33.2 g/dL (ref 30.0–36.0)
MCV: 90.4 fL (ref 80.0–100.0)
Monocytes Absolute: 0.8 10*3/uL (ref 0.1–1.0)
Monocytes Relative: 7 %
Neutro Abs: 7 10*3/uL (ref 1.7–7.7)
Neutrophils Relative %: 68 %
Platelet Count: 268 10*3/uL (ref 150–400)
RBC: 3.43 MIL/uL — ABNORMAL LOW (ref 3.87–5.11)
RDW: 19.5 % — ABNORMAL HIGH (ref 11.5–15.5)
WBC Count: 10.2 10*3/uL (ref 4.0–10.5)
nRBC: 0 % (ref 0.0–0.2)

## 2018-01-24 LAB — IRON AND TIBC
Iron: 75 ug/dL (ref 41–142)
Saturation Ratios: 31 % (ref 21–57)
TIBC: 239 ug/dL (ref 236–444)
UIBC: 164 ug/dL (ref 120–384)

## 2018-01-24 LAB — FERRITIN: Ferritin: 591 ng/mL — ABNORMAL HIGH (ref 11–307)

## 2018-01-24 LAB — CEA (IN HOUSE-CHCC): CEA (CHCC-In House): 12.49 ng/mL — ABNORMAL HIGH (ref 0.00–5.00)

## 2018-01-24 MED ORDER — SODIUM CHLORIDE 0.9 % IV SOLN
2400.0000 mg/m2 | INTRAVENOUS | Status: DC
  Administered 2018-01-24: 4550 mg via INTRAVENOUS
  Filled 2018-01-24: qty 91

## 2018-01-24 MED ORDER — LEUCOVORIN CALCIUM INJECTION 350 MG
400.0000 mg/m2 | Freq: Once | INTRAVENOUS | Status: AC
  Administered 2018-01-24: 760 mg via INTRAVENOUS
  Filled 2018-01-24: qty 38

## 2018-01-24 MED ORDER — DEXAMETHASONE SODIUM PHOSPHATE 10 MG/ML IJ SOLN
10.0000 mg | Freq: Once | INTRAMUSCULAR | Status: AC
  Administered 2018-01-24: 10 mg via INTRAVENOUS

## 2018-01-24 MED ORDER — OXALIPLATIN CHEMO INJECTION 100 MG/20ML
85.0000 mg/m2 | Freq: Once | INTRAVENOUS | Status: AC
  Administered 2018-01-24: 160 mg via INTRAVENOUS
  Filled 2018-01-24: qty 32

## 2018-01-24 MED ORDER — PALONOSETRON HCL INJECTION 0.25 MG/5ML
INTRAVENOUS | Status: AC
  Filled 2018-01-24: qty 5

## 2018-01-24 MED ORDER — DEXTROSE 5 % IV SOLN
Freq: Once | INTRAVENOUS | Status: AC
  Administered 2018-01-24: 10:00:00 via INTRAVENOUS
  Filled 2018-01-24: qty 250

## 2018-01-24 MED ORDER — SODIUM CHLORIDE 0.9% FLUSH
10.0000 mL | INTRAVENOUS | Status: DC | PRN
  Filled 2018-01-24: qty 10

## 2018-01-24 MED ORDER — DEXAMETHASONE SODIUM PHOSPHATE 10 MG/ML IJ SOLN
INTRAMUSCULAR | Status: AC
  Filled 2018-01-24: qty 1

## 2018-01-24 MED ORDER — PALONOSETRON HCL INJECTION 0.25 MG/5ML
0.2500 mg | Freq: Once | INTRAVENOUS | Status: AC
  Administered 2018-01-24: 0.25 mg via INTRAVENOUS

## 2018-01-24 MED ORDER — HEPARIN SOD (PORK) LOCK FLUSH 100 UNIT/ML IV SOLN
500.0000 [IU] | Freq: Once | INTRAVENOUS | Status: DC | PRN
  Filled 2018-01-24: qty 5

## 2018-01-24 NOTE — Telephone Encounter (Signed)
Printed calendar and avs.  Scheduled appt with nutrition per 1/15 los.

## 2018-01-24 NOTE — Patient Instructions (Signed)
[facility] Discharge Instructions for Patients Receiving Chemotherapy  Today you received the following chemotherapy agents: Oxaliplatin, leucovorin, 5FU   To help prevent nausea and vomiting after your treatment, we encourage you to take your nausea medication as directed.    If you develop nausea and vomiting that is not controlled by your nausea medication, call the clinic.   BELOW ARE SYMPTOMS THAT SHOULD BE REPORTED IMMEDIATELY:  *FEVER GREATER THAN 100.5 F  *CHILLS WITH OR WITHOUT FEVER  NAUSEA AND VOMITING THAT IS NOT CONTROLLED WITH YOUR NAUSEA MEDICATION  *UNUSUAL SHORTNESS OF BREATH  *UNUSUAL BRUISING OR BLEEDING  TENDERNESS IN MOUTH AND THROAT WITH OR WITHOUT PRESENCE OF ULCERS  *URINARY PROBLEMS  *BOWEL PROBLEMS  UNUSUAL RASH Items with * indicate a potential emergency and should be followed up as soon as possible.  Feel free to call the clinic should you have any questions or concerns. The clinic phone number is [phone].  Please show the CHEMO ALERT CARD at check-in to the Emergency Department and triage nurse.   

## 2018-01-26 ENCOUNTER — Inpatient Hospital Stay: Payer: Medicare Other | Admitting: Nutrition

## 2018-01-26 ENCOUNTER — Inpatient Hospital Stay: Payer: Medicare Other

## 2018-01-26 VITALS — BP 142/72 | HR 88 | Temp 98.2°F | Resp 18

## 2018-01-26 DIAGNOSIS — C20 Malignant neoplasm of rectum: Secondary | ICD-10-CM

## 2018-01-26 DIAGNOSIS — C19 Malignant neoplasm of rectosigmoid junction: Secondary | ICD-10-CM | POA: Diagnosis not present

## 2018-01-26 MED ORDER — HEPARIN SOD (PORK) LOCK FLUSH 100 UNIT/ML IV SOLN
500.0000 [IU] | Freq: Once | INTRAVENOUS | Status: AC | PRN
  Administered 2018-01-26: 500 [IU]
  Filled 2018-01-26: qty 5

## 2018-01-26 MED ORDER — SODIUM CHLORIDE 0.9% FLUSH
10.0000 mL | INTRAVENOUS | Status: DC | PRN
  Administered 2018-01-26: 10 mL
  Filled 2018-01-26: qty 10

## 2018-01-26 MED ORDER — PEGFILGRASTIM-CBQV 6 MG/0.6ML ~~LOC~~ SOSY
6.0000 mg | PREFILLED_SYRINGE | Freq: Once | SUBCUTANEOUS | Status: AC
  Administered 2018-01-26: 6 mg via SUBCUTANEOUS

## 2018-01-26 MED ORDER — PEGFILGRASTIM-CBQV 6 MG/0.6ML ~~LOC~~ SOSY
PREFILLED_SYRINGE | SUBCUTANEOUS | Status: AC
  Filled 2018-01-26: qty 0.6

## 2018-01-26 NOTE — Progress Notes (Signed)
68 year old female diagnosed with rectal cancer.  She is a patient of Dr. Burr Medico.  Past medical history includes diabetes type 2, tobacco, PAD, hypothyroidism, hypertension, hypercholesterolemia, GERD, and gangrene toe.  Medications include Lomotil, Amaryl, Synthroid, Magic mouthwash, Glucophage XR, Remeron, multivitamin, Zofran, Compazine, iron, and vitamin B12.  Labs include glucose 174, creatinine 1.18, and albumin 3.2.  Height: 66 inches. Weight: 161.4 pounds. Usual body weight: 165 pounds.  Patient weighed 169 pounds January 05, 2018 but weighed 161 pounds December 05, 2017. BMI: 26.05.  Patient reports she has a poor appetite and no taste for food. Reports she does not feel like she has lost weight. She has occasionally tried oral nutrition supplements, specifically boost.  She reports she does not like these. Reports she does have diarrhea which is improved when taking Lomotil.  Nutrition diagnosis:  Food and nutrition related knowledge deficit related to rectal cancer and associated treatments as evidenced by no prior need for nutrition related information.  Intervention: Educated patient on strategies for improving appetite and provided fact sheet. Recommended patient consume small, frequent meals and snacks consisting of high-calorie high-protein foods for weight maintenance.  Provided fact sheet. Educated patient on strategies for improving diarrhea.  Provided fact sheet. Provided samples of Ensure Enlive and coupons. Questions were answered. Teach back method used.  Contact information given.  Monitoring, evaluation, goals: Patient will tolerate adequate calories and protein to promote weight stabilization.  Food and nutrition related knowledge deficit resolved.  No follow-up needed.  **Disclaimer: This note was dictated with voice recognition software. Similar sounding words can inadvertently be transcribed and this note may contain transcription errors which may not have  been corrected upon publication of note.**

## 2018-02-05 NOTE — Progress Notes (Signed)
[place]   [phone] [phone]   Clinic Follow up Note   Patient Care Team: Reynold Bowen, MD as PCP - General (Endocrinology) Lorretta Harp, MD as PCP - Cardiology (Cardiology)  Date of Service:  02/07/2018  CHIEF COMPLAINT: F/u of Rectal cancer  SUMMARY OF ONCOLOGIC HISTORY: Oncology History   Cancer Staging Rectal cancer [facility]) Staging form: Colon and Rectum, AJCC 8th Edition - Clinical stage from 10/04/2017: Stage IIIB (cT3, cN1, cM0) - Signed by Truitt Merle, MD on 11/18/2017       Rectal cancer ([place])   10/04/2017 Procedure    Colonoscopy per Dr. Collene Mares 10/04/17 shows a large, non-obstructing horse-shoe shaped mass was found in the recto-sigmoid colon at 10 cm; the mass was partially circumferential involving one-half of the lumen circumference. The mass measured seven cm in length    10/04/2017 Initial Biopsy    Rectum, at 10-17 cm, mass biopsy -invasive adenocarcinoma, moderately differentiated  ICH stains for MLH1, MSH2, MSH6, and PMS2 are intact (normal)    10/04/2017 Cancer Staging    Staging form: Colon and Rectum, AJCC 8th Edition - Clinical stage from 10/04/2017: Stage IIIB (cT3, cN1, cM0) - Signed by Truitt Merle, MD on 11/18/2017    10/25/2017 Imaging    CT CAP IMPRESSION: 1. Irregular eccentric mass within the sigmoid colon. There are at least 3 abnormal appearing lymph node adjacent to the sigmoid colon at the level of the mass concerning for the possibility of local nodal metastatic disease. 2. Multiple pulmonary nodules as described above. These are indeterminate in etiology. Recommend attention on follow-up.    11/06/2017 Initial Diagnosis    Adenocarcinoma of colon ([place])    11/16/2017 Imaging    Pelvic MRI 11/16/17  IMPRESSION: Rectal adenocarcinoma T stage:   T3 Rectal adenocarcinoma N stage:  N1 Distance from tumor to the anal sphincter is 6.9 cm.    11/29/2017 -  Chemotherapy    totalneoadjuvant chemo FOLFOXq2  weeks x4 months, starting 11/20, followed by concurrent chemoRT    01/17/2018 Imaging    CT AP W Contrast 01/17/18 IMPRESSION: 1. Previously seen soft tissue mass involving the left lateral wall the upper rectum is no longer visualized. 2. Decreased size of left perirectal lymph nodes since prior exam. 3. No other sites of metastatic disease within the abdomen or pelvis. 4. Colonic diverticulosis, without radiographic evidence of diverticulitis. 5. Stable small adjacent paraumbilical hernias containing only fat.      CURRENT THERAPY:  neoadjuvant chemo FOLFOXq2 weeks x4 months, starting 11/20, followed by concurrent chemoRT  INTERVAL HISTORY:  Nicole Sellers is here for a follow up of treatment. She presents to the clinic today by herself. She notes she been doing moderately well. She has experienced nausea with last cycle. She has lost 8 pounds over the last month. She notes some days she does not have an appetite. She drinks ensure when she starts feeling weak but does not want to drink them otherwise.  She notes skin darkened from chemo and hopes this recovers. She denies cough, abdominal issues or fever. She notes her bowel movements have improved with chemo.      REVIEW OF SYSTEMS:   Constitutional: Denies fevers, chills (+) 8 pound weight loss (+) fatigue  Eyes: Denies blurriness of vision Ears, nose, mouth, throat, and face: Denies mucositis or sore throat Respiratory: Denies cough, dyspnea or wheezes Cardiovascular: Denies palpitation, chest discomfort or lower extremity swelling Gastrointestinal:  Denies nausea, heartburn or change in  bowel habits Skin: Denies abnormal skin rashes (+) skin darkening  Lymphatics: Denies new lymphadenopathy or easy bruising Neurological:Denies numbness, tingling or new weaknesses Behavioral/Psych: Mood is stable, no new changes  All other systems were reviewed with the patient and are negative.  MEDICAL HISTORY:  Past Medical History:   Diagnosis Date  . Allergy   . Anemia   . Arthritis    "joints sometimes" (12/25/2012)  . Clotting disorder ([place])   . Colon cancer ([place])   . Critical lower limb ischemia   . Gangrene of toe, Rt second toe 12/25/2012  . GERD (gastroesophageal reflux disease)   . High cholesterol   . Hypertension   . Hypothyroidism   . PAD (peripheral artery disease) ([place])   . S/P arterial stent, 12/25/13, successful diamondback orbital rotational arthrectomy, PTA using chocolate  balloon and stenting using I DEV stent of long segment calcified high-grade proximal and mid r 12/25/2012  . Tobacco abuse   . Type II diabetes mellitus ([place])     SURGICAL HISTORY: Past Surgical History:  Procedure Laterality Date  . ABDOMINAL AORTAGRAM  12/20/2012   Procedure: ABDOMINAL AORTAGRAM;  Surgeon: Lorretta Harp, MD;  Location: [facility] CATH LAB;  Service: Cardiovascular;;  . ABDOMINAL HYSTERECTOMY  2000  . ANGIOPLASTY / STENTING FEMORAL Right 12/25/2012  . ATHERECTOMY Right 12/25/2012   Procedure: ATHERECTOMY;  Surgeon: Lorretta Harp, MD;  Location: [facility] CATH LAB;  Service: Cardiovascular;  Laterality: Right;  right SFA  . HERNIA REPAIR    . LOWER EXTREMITY ANGIOGRAM N/A 12/20/2012   Procedure: LOWER EXTREMITY ANGIOGRAM;  Surgeon: Lorretta Harp, MD;  Location: [facility] CATH LAB;  Service: Cardiovascular;  Laterality: N/A;  . LOWER EXTREMITY ANGIOGRAM N/A 12/25/2012   Procedure: LOWER EXTREMITY ANGIOGRAM;  Surgeon: Lorretta Harp, MD;  Location: [facility] CATH LAB;  Service: Cardiovascular;  Laterality: N/A;  . LOWER EXTREMITY INTERVENTION  05/08/2017  . LOWER EXTREMITY INTERVENTION Bilateral 05/08/2017   Procedure: LOWER EXTREMITY INTERVENTION;  Surgeon: Lorretta Harp, MD;  Location: [place] CV LAB;  Service: Cardiovascular;  Laterality: Bilateral;  . PERIPHERAL VASCULAR BALLOON ANGIOPLASTY Right 05/08/2017   Procedure: PERIPHERAL VASCULAR BALLOON ANGIOPLASTY;  Surgeon: Lorretta Harp, MD;  Location: [place] CV LAB;  Service: Cardiovascular;  Laterality: Right;  sfa  . PERIPHERAL VASCULAR INTERVENTION Right 05/08/2017   Procedure: PERIPHERAL VASCULAR INTERVENTION;  Surgeon: Lorretta Harp, MD;  Location: [place] CV LAB;  Service: Cardiovascular;  Laterality: Right;  ext iliac  . PORTACATH PLACEMENT Right 11/22/2017   Procedure: INSERTION PORT-A-CATH;  Surgeon: Leighton Ruff, MD;  Location: [facility] ORS;  Service: General;  Laterality: Right;  . TOE SURGERY Right    2n toe   . UMBILICAL HERNIA REPAIR  2000    I have reviewed the social history and family history with the patient and they are unchanged from previous note.  ALLERGIES:  is allergic to kiwi extract; lisinopril; and plavix [clopidogrel bisulfate].  MEDICATIONS:  Current Outpatient Medications  Medication Sig Dispense Refill  . acetaminophen (TYLENOL) 500 MG tablet Take 500 mg by mouth 2 (two) times daily as needed for moderate pain or headache.     . albuterol (PROVENTIL HFA;VENTOLIN HFA) 108 (90 BASE) MCG/ACT inhaler Inhale 2 puffs into the lungs every 6 (six) hours as needed for wheezing or shortness of breath.    Marland Kitchen amLODipine (NORVASC) 5 MG tablet Take 1 tablet (5 mg total) by mouth daily. 90 tablet 2  . aspirin 81 MG tablet Take 81  mg by mouth daily.    Marland Kitchen atorvastatin (LIPITOR) 10 MG tablet Take 1 tablet (10 mg total) by mouth daily. 90 tablet 1  . Calcium Carbonate-Vitamin D (CALCIUM 600+D PO) Take 1 tablet by mouth daily.    . cetirizine (ZYRTEC) 10 MG tablet Take 10 mg by mouth daily.    . cholecalciferol (VITAMIN D) 1000 units tablet Take 4,000 Units by mouth daily.     . clopidogrel (PLAVIX) 75 MG tablet Take 1 tablet (75 mg total) by mouth daily with breakfast. 90 tablet 3  . diphenoxylate-atropine (LOMOTIL) 2.5-0.025 MG tablet Take 1-2 tablets by mouth 4 (four) times daily as needed for diarrhea or loose stools. 40 tablet 2  . FERREX 150 150 MG capsule Take 150 mg by mouth 2 (two) times daily.  11  . gabapentin  (NEURONTIN) 300 MG capsule Take 300 mg by mouth daily.     Marland Kitchen glimepiride (AMARYL) 1 MG tablet Take 1 mg by mouth 2 (two) times daily.    Marland Kitchen levothyroxine (SYNTHROID, LEVOTHROID) 50 MCG tablet Take 1 tablet (50 mcg total) by mouth daily. 90 tablet 1  . lidocaine-prilocaine (EMLA) cream Apply to affected area once 30 g 3  . magic mouthwash w/lidocaine SOLN Take 5 mLs by mouth 3 (three) times daily. Swish and Spit 240 mL 0  . metFORMIN (GLUCOPHAGE-XR) 500 MG 24 hr tablet TAKE 2 TABLETS BY MOUTH  TWICE A DAY 60 tablet 0  . mirtazapine (REMERON) 7.5 MG tablet Take 1 tablet (7.5 mg total) by mouth at bedtime. If tolerating after 1 week, can increase to 2 tabs at night 60 tablet 1  . Misc Natural Products (OSTEO BI-FLEX ADV TRIPLE ST) TABS Take 1 tablet by mouth 2 (two) times daily.    . Multiple Vitamin (MULTIVITAMIN) capsule Take 1 capsule by mouth daily.    . ondansetron (ZOFRAN) 8 MG tablet Take 1 tablet (8 mg total) by mouth 2 (two) times daily as needed for refractory nausea / vomiting. Start on day 3 after chemotherapy. 30 tablet 1  . prochlorperazine (COMPAZINE) 10 MG tablet Take 1 tablet (10 mg total) by mouth every 6 (six) hours as needed (Nausea or vomiting). 30 tablet 1  . tetrahydrozoline-zinc (VISINE-AC) 0.05-0.25 % ophthalmic solution Place 1 drop into both eyes 2 (two) times daily as needed (dry eyes).    . Triamcinolone Acetonide (NASACORT ALLERGY 24HR NA) Place 1 spray into the nose daily as needed (allergies).    . triamterene-hydrochlorothiazide (MAXZIDE) 75-50 MG tablet Take 1 tablet by mouth daily.    Loura Pardon Salicylate (ASPERCREME EX) Apply 1 application topically daily as needed (joint pain).    . vitamin B-12 (CYANOCOBALAMIN) 500 MCG tablet Take 500 mcg by mouth daily.     No current facility-administered medications for this visit.    Facility-Administered Medications Ordered in Other Visits  Medication Dose Route Frequency Provider Last Rate Last Dose  . fluorouracil  (ADRUCIL) 4,550 mg in sodium chloride 0.9 % 59 mL chemo infusion  2,400 mg/m2 (Treatment Plan Recorded) Intravenous 1 day or 1 dose Truitt Merle, MD      . heparin lock flush 100 unit/mL  500 Units Intracatheter Once PRN Alla Feeling, NP      . leucovorin 760 mg in dextrose 5 % 250 mL infusion  400 mg/m2 (Treatment Plan Recorded) Intravenous Once Truitt Merle, MD      . oxaliplatin (ELOXATIN) 160 mg in dextrose 5 % 500 mL chemo infusion  85 mg/m2 (Treatment Plan  Recorded) Intravenous Once Truitt Merle, MD      . sodium chloride flush (NS) 0.9 % injection 10 mL  10 mL Intracatheter Once PRN Alla Feeling, NP        PHYSICAL EXAMINATION: ECOG PERFORMANCE STATUS: 1 - Symptomatic but completely ambulatory  Vitals:   02/07/18 0835  BP: (!) 146/72  Pulse: 84  Resp: 18  Temp: 98.4 F (36.9 C)  SpO2: 100%   Filed Weights   02/07/18 0835  Weight: 157 lb 12.8 oz (71.6 kg)    GENERAL:alert, no distress and comfortable SKIN: skin color, texture, turgor are normal, no rashes or significant lesions EYES: normal, Conjunctiva are pink and non-injected, sclera clear OROPHARYNX:no exudate, no erythema and lips, buccal mucosa, and tongue normal  NECK: supple, thyroid normal size, non-tender, without nodularity LYMPH:  no palpable lymphadenopathy in the cervical, axillary or inguinal LUNGS: clear to auscultation and percussion with normal breathing effort HEART: regular rate & rhythm and no murmurs and no lower extremity edema ABDOMEN:abdomen soft, non-tender and normal bowel sounds Musculoskeletal:no cyanosis of digits and no clubbing  NEURO: alert & oriented x 3 with fluent speech, no focal motor/sensory deficits  LABORATORY DATA:  I have reviewed the data as listed CBC Latest Ref Rng & Units 02/07/2018 01/24/2018 01/08/2018  WBC 4.0 - 10.5 K/uL 13.2(H) 10.2 10.4  Hemoglobin 12.0 - 15.0 g/dL 9.6(L) 10.3(L) 9.6(L)  Hematocrit 36.0 - 46.0 % 28.7(L) 31.0(L) 29.6(L)  Platelets 150 - 400 K/uL 219 268  197     CMP Latest Ref Rng & Units 02/07/2018 01/24/2018 01/08/2018  Glucose 70 - 99 mg/dL 160(H) 174(H) 175(H)  BUN 8 - 23 mg/dL _0 Creatinine 0.44 - 1.00 mg/dL 1.19(H) 1.18(H) 0.98  Sodium 135 - 145 mmol/L 140 141 140  Potassium 3.5 - 5.1 mmol/L 3.6 3.5 3.9  Chloride 98 - 111 mmol/L 104 102 102  CO2 22 - 32 mmol/L _1 Calcium 8.9 - 10.3 mg/dL 8.7(L) 9.3 9.0  Total Protein 6.5 - 8.1 g/dL 6.3(L) 6.5 6.3(L)  Total Bilirubin 0.3 - 1.2 mg/dL 0.3 0.3 0.3  Alkaline Phos 38 - 126 U/L 138(H) 141(H) 130(H)  AST 15 - 41 U/L 32 26 23  ALT 0 - 44 U/L _2 RADIOGRAPHIC STUDIES: I have personally reviewed the radiological images as listed and agreed with the findings in the report. No results found.   ASSESSMENT & PLAN:  Nicole Sellers is a 68 y.o. female with   1. Adenocarcinoma of the rectosigmoid colon, moderately differentiated,cT3N1M0, stage IIIB,MMR normal -Diagnosed in 09/2017.She is on neoadjuvantchemo FOLFOXwithudenyca, plan for 4 months followed by chemoRT and surgery -she is toleratingwell overallwith mildfatigue,controlleddiarrhea,low appetite and mild numbness in fingers from cold sensitivity. Recovers well. -Labs reviewed, WBC at 13.2, Hg at 9.6, ANC at 9.3, CMP is still pending. Overall adequate to proceed with FOLFOX today. -Will proceed with radiation after the next 2 cycles chemo  -F/u in 2 weeks.   2. Iron deficiency anemia, secondary to #1 -She is currently on2oral ironpills a day. Will continue. -She previously declined IV iron. -Labs reviewed,CBC showed Hgimproved to 9.6 today (02/07/2018), overall stable   3. Depression -Due to her diagnosis and treatment. -Currently on mirtazapine. -Her mood has been stable.   4. Diarrhea -She initially complained of constipation that has changed to diarrheaafter chemo. -She is currently on imodium as neededand diarrhea is controlled -Her bowel movements have improved with  treatment and  now and stable.   5. Decreased appetite, weight loss -Currently on Mirtazapine. -She has lost 8 pounds since last month due to lower appetite.  -Continue to follow up with dietician Pamala Hurry -Iagainencouraged her to drink more Glucerna supplements in addition to eat more.    Plan -lab reviewed, adequate cycle 6 FOLFOXtoday  -lab, flush, f/u and treatment in 2 weeks     No problem-specific Assessment & Plan notes found for this encounter.   No orders of the defined types were placed in this encounter.  All questions were answered. The patient knows to call the clinic with any problems, questions or concerns. No barriers to learning was detected. I spent 20 minutes counseling the patient face to face. The total time spent in the appointment was 25 minutes and more than 50% was on counseling and review of test results     Truitt Merle, MD 02/07/2018   I, Joslyn Devon, am acting as scribe for Truitt Merle, MD.   I have reviewed the above documentation for accuracy and completeness, and I agree with the above.

## 2018-02-07 ENCOUNTER — Inpatient Hospital Stay: Payer: Medicare Other

## 2018-02-07 ENCOUNTER — Inpatient Hospital Stay (HOSPITAL_BASED_OUTPATIENT_CLINIC_OR_DEPARTMENT_OTHER): Payer: Medicare Other | Admitting: Hematology

## 2018-02-07 ENCOUNTER — Encounter: Payer: Self-pay | Admitting: Hematology

## 2018-02-07 VITALS — BP 146/72 | HR 84 | Temp 98.4°F | Resp 18 | Ht 66.0 in | Wt 157.8 lb

## 2018-02-07 DIAGNOSIS — D509 Iron deficiency anemia, unspecified: Secondary | ICD-10-CM

## 2018-02-07 DIAGNOSIS — K219 Gastro-esophageal reflux disease without esophagitis: Secondary | ICD-10-CM

## 2018-02-07 DIAGNOSIS — R634 Abnormal weight loss: Secondary | ICD-10-CM

## 2018-02-07 DIAGNOSIS — C20 Malignant neoplasm of rectum: Secondary | ICD-10-CM

## 2018-02-07 DIAGNOSIS — M129 Arthropathy, unspecified: Secondary | ICD-10-CM

## 2018-02-07 DIAGNOSIS — Z79899 Other long term (current) drug therapy: Secondary | ICD-10-CM

## 2018-02-07 DIAGNOSIS — Z7984 Long term (current) use of oral hypoglycemic drugs: Secondary | ICD-10-CM

## 2018-02-07 DIAGNOSIS — Z5111 Encounter for antineoplastic chemotherapy: Secondary | ICD-10-CM | POA: Diagnosis not present

## 2018-02-07 DIAGNOSIS — R5383 Other fatigue: Secondary | ICD-10-CM

## 2018-02-07 DIAGNOSIS — Z7689 Persons encountering health services in other specified circumstances: Secondary | ICD-10-CM

## 2018-02-07 DIAGNOSIS — C19 Malignant neoplasm of rectosigmoid junction: Secondary | ICD-10-CM

## 2018-02-07 DIAGNOSIS — R630 Anorexia: Secondary | ICD-10-CM

## 2018-02-07 DIAGNOSIS — D689 Coagulation defect, unspecified: Secondary | ICD-10-CM

## 2018-02-07 DIAGNOSIS — Z7982 Long term (current) use of aspirin: Secondary | ICD-10-CM

## 2018-02-07 DIAGNOSIS — E1151 Type 2 diabetes mellitus with diabetic peripheral angiopathy without gangrene: Secondary | ICD-10-CM

## 2018-02-07 DIAGNOSIS — Z95828 Presence of other vascular implants and grafts: Secondary | ICD-10-CM

## 2018-02-07 DIAGNOSIS — E7800 Pure hypercholesterolemia, unspecified: Secondary | ICD-10-CM

## 2018-02-07 DIAGNOSIS — R197 Diarrhea, unspecified: Secondary | ICD-10-CM

## 2018-02-07 DIAGNOSIS — E119 Type 2 diabetes mellitus without complications: Secondary | ICD-10-CM

## 2018-02-07 DIAGNOSIS — R200 Anesthesia of skin: Secondary | ICD-10-CM

## 2018-02-07 DIAGNOSIS — K5730 Diverticulosis of large intestine without perforation or abscess without bleeding: Secondary | ICD-10-CM

## 2018-02-07 DIAGNOSIS — K5900 Constipation, unspecified: Secondary | ICD-10-CM

## 2018-02-07 DIAGNOSIS — E039 Hypothyroidism, unspecified: Secondary | ICD-10-CM

## 2018-02-07 DIAGNOSIS — R918 Other nonspecific abnormal finding of lung field: Secondary | ICD-10-CM

## 2018-02-07 DIAGNOSIS — I10 Essential (primary) hypertension: Secondary | ICD-10-CM

## 2018-02-07 DIAGNOSIS — R110 Nausea: Secondary | ICD-10-CM

## 2018-02-07 DIAGNOSIS — F329 Major depressive disorder, single episode, unspecified: Secondary | ICD-10-CM

## 2018-02-07 LAB — CBC WITH DIFFERENTIAL (CANCER CENTER ONLY)
Abs Immature Granulocytes: 0.17 10*3/uL — ABNORMAL HIGH (ref 0.00–0.07)
Basophils Absolute: 0.1 10*3/uL (ref 0.0–0.1)
Basophils Relative: 1 %
Eosinophils Absolute: 0.3 10*3/uL (ref 0.0–0.5)
Eosinophils Relative: 2 %
HCT: 28.7 % — ABNORMAL LOW (ref 36.0–46.0)
Hemoglobin: 9.6 g/dL — ABNORMAL LOW (ref 12.0–15.0)
Immature Granulocytes: 1 %
Lymphocytes Relative: 19 %
Lymphs Abs: 2.5 10*3/uL (ref 0.7–4.0)
MCH: 31 pg (ref 26.0–34.0)
MCHC: 33.4 g/dL (ref 30.0–36.0)
MCV: 92.6 fL (ref 80.0–100.0)
Monocytes Absolute: 0.9 10*3/uL (ref 0.1–1.0)
Monocytes Relative: 7 %
Neutro Abs: 9.3 10*3/uL — ABNORMAL HIGH (ref 1.7–7.7)
Neutrophils Relative %: 70 %
Platelet Count: 219 10*3/uL (ref 150–400)
RBC: 3.1 MIL/uL — ABNORMAL LOW (ref 3.87–5.11)
RDW: 18 % — ABNORMAL HIGH (ref 11.5–15.5)
WBC Count: 13.2 10*3/uL — ABNORMAL HIGH (ref 4.0–10.5)
nRBC: 0 % (ref 0.0–0.2)

## 2018-02-07 LAB — CMP (CANCER CENTER ONLY)
ALT: 20 U/L (ref 0–44)
AST: 32 U/L (ref 15–41)
Albumin: 3.2 g/dL — ABNORMAL LOW (ref 3.5–5.0)
Alkaline Phosphatase: 138 U/L — ABNORMAL HIGH (ref 38–126)
Anion gap: 11 (ref 5–15)
BUN: 11 mg/dL (ref 8–23)
CO2: 25 mmol/L (ref 22–32)
Calcium: 8.7 mg/dL — ABNORMAL LOW (ref 8.9–10.3)
Chloride: 104 mmol/L (ref 98–111)
Creatinine: 1.19 mg/dL — ABNORMAL HIGH (ref 0.44–1.00)
GFR, Est AFR Am: 54 mL/min — ABNORMAL LOW (ref >60)
GFR, Estimated: 47 mL/min — ABNORMAL LOW (ref >60)
Glucose, Bld: 160 mg/dL — ABNORMAL HIGH (ref 70–99)
Potassium: 3.6 mmol/L (ref 3.5–5.1)
Sodium: 140 mmol/L (ref 135–145)
Total Bilirubin: 0.3 mg/dL (ref 0.3–1.2)
Total Protein: 6.3 g/dL — ABNORMAL LOW (ref 6.5–8.1)

## 2018-02-07 MED ORDER — DEXTROSE 5 % IV SOLN
Freq: Once | INTRAVENOUS | Status: AC
  Administered 2018-02-07: 10:00:00 via INTRAVENOUS
  Filled 2018-02-07: qty 250

## 2018-02-07 MED ORDER — DEXAMETHASONE SODIUM PHOSPHATE 10 MG/ML IJ SOLN
INTRAMUSCULAR | Status: AC
  Filled 2018-02-07: qty 1

## 2018-02-07 MED ORDER — PALONOSETRON HCL INJECTION 0.25 MG/5ML
0.2500 mg | Freq: Once | INTRAVENOUS | Status: AC
  Administered 2018-02-07: 0.25 mg via INTRAVENOUS

## 2018-02-07 MED ORDER — PALONOSETRON HCL INJECTION 0.25 MG/5ML
INTRAVENOUS | Status: AC
  Filled 2018-02-07: qty 5

## 2018-02-07 MED ORDER — LEUCOVORIN CALCIUM INJECTION 350 MG
400.0000 mg/m2 | Freq: Once | INTRAVENOUS | Status: AC
  Administered 2018-02-07: 760 mg via INTRAVENOUS
  Filled 2018-02-07: qty 38

## 2018-02-07 MED ORDER — SODIUM CHLORIDE 0.9 % IV SOLN
2400.0000 mg/m2 | INTRAVENOUS | Status: DC
  Administered 2018-02-07: 4550 mg via INTRAVENOUS
  Filled 2018-02-07: qty 91

## 2018-02-07 MED ORDER — OXALIPLATIN CHEMO INJECTION 100 MG/20ML
85.0000 mg/m2 | Freq: Once | INTRAVENOUS | Status: AC
  Administered 2018-02-07: 160 mg via INTRAVENOUS
  Filled 2018-02-07: qty 32

## 2018-02-07 MED ORDER — DEXAMETHASONE SODIUM PHOSPHATE 10 MG/ML IJ SOLN
10.0000 mg | Freq: Once | INTRAMUSCULAR | Status: AC
  Administered 2018-02-07: 10 mg via INTRAVENOUS

## 2018-02-07 MED ORDER — SODIUM CHLORIDE 0.9% FLUSH
10.0000 mL | INTRAVENOUS | Status: DC | PRN
  Administered 2018-02-07: 10 mL via INTRAVENOUS
  Filled 2018-02-07: qty 10

## 2018-02-07 NOTE — Patient Instructions (Signed)
[facility] Discharge Instructions for Patients Receiving Chemotherapy  Today you received the following chemotherapy agents Oxaliplatin, Leucovorin, 5FU  To help prevent nausea and vomiting after your treatment, we encourage you to take your nausea medication as directed   If you develop nausea and vomiting that is not controlled by your nausea medication, call the clinic.   BELOW ARE SYMPTOMS THAT SHOULD BE REPORTED IMMEDIATELY:  *FEVER GREATER THAN 100.5 F  *CHILLS WITH OR WITHOUT FEVER  NAUSEA AND VOMITING THAT IS NOT CONTROLLED WITH YOUR NAUSEA MEDICATION  *UNUSUAL SHORTNESS OF BREATH  *UNUSUAL BRUISING OR BLEEDING  TENDERNESS IN MOUTH AND THROAT WITH OR WITHOUT PRESENCE OF ULCERS  *URINARY PROBLEMS  *BOWEL PROBLEMS  UNUSUAL RASH Items with * indicate a potential emergency and should be followed up as soon as possible.  Feel free to call the clinic should you have any questions or concerns. The clinic phone number is [phone].  Please show the CHEMO ALERT CARD at check-in to the Emergency Department and triage nurse.   

## 2018-02-08 ENCOUNTER — Telehealth: Payer: Self-pay | Admitting: Hematology

## 2018-02-08 NOTE — Telephone Encounter (Signed)
No los per 01/29. °

## 2018-02-09 ENCOUNTER — Inpatient Hospital Stay: Payer: Medicare Other

## 2018-02-09 VITALS — BP 158/62 | HR 88 | Temp 98.9°F | Resp 16

## 2018-02-09 DIAGNOSIS — C20 Malignant neoplasm of rectum: Secondary | ICD-10-CM

## 2018-02-09 DIAGNOSIS — C19 Malignant neoplasm of rectosigmoid junction: Secondary | ICD-10-CM | POA: Diagnosis not present

## 2018-02-09 MED ORDER — PEGFILGRASTIM-CBQV 6 MG/0.6ML ~~LOC~~ SOSY
PREFILLED_SYRINGE | SUBCUTANEOUS | Status: AC
  Filled 2018-02-09: qty 0.6

## 2018-02-09 MED ORDER — HEPARIN SOD (PORK) LOCK FLUSH 100 UNIT/ML IV SOLN
500.0000 [IU] | Freq: Once | INTRAVENOUS | Status: AC | PRN
  Administered 2018-02-09: 500 [IU]
  Filled 2018-02-09: qty 5

## 2018-02-09 MED ORDER — SODIUM CHLORIDE 0.9% FLUSH
10.0000 mL | INTRAVENOUS | Status: DC | PRN
  Administered 2018-02-09: 10 mL
  Filled 2018-02-09: qty 10

## 2018-02-09 MED ORDER — PEGFILGRASTIM-CBQV 6 MG/0.6ML ~~LOC~~ SOSY
6.0000 mg | PREFILLED_SYRINGE | Freq: Once | SUBCUTANEOUS | Status: AC
  Administered 2018-02-09: 6 mg via SUBCUTANEOUS

## 2018-02-09 NOTE — Patient Instructions (Signed)
Pegfilgrastim injection  What is this medicine?  PEGFILGRASTIM (PEG fil gra stim) is a long-acting granulocyte colony-stimulating factor that stimulates the growth of neutrophils, a type of white blood cell important in the body's fight against infection. It is used to reduce the incidence of fever and infection in patients with certain types of cancer who are receiving chemotherapy that affects the bone marrow, and to increase survival after being exposed to high doses of radiation.  This medicine may be used for other purposes; ask your health care provider or pharmacist if you have questions.  COMMON BRAND NAME(S): Fulphila, Neulasta, UDENYCA  What should I tell my health care provider before I take this medicine?  They need to know if you have any of these conditions:  -kidney disease  -latex allergy  -ongoing radiation therapy  -sickle cell disease  -skin reactions to acrylic adhesives (On-Body Injector only)  -an unusual or allergic reaction to pegfilgrastim, filgrastim, other medicines, foods, dyes, or preservatives  -pregnant or trying to get pregnant  -breast-feeding  How should I use this medicine?  This medicine is for injection under the skin. If you get this medicine at home, you will be taught how to prepare and give the pre-filled syringe or how to use the On-body Injector. Refer to the patient Instructions for Use for detailed instructions. Use exactly as directed. Tell your healthcare provider immediately if you suspect that the On-body Injector may not have performed as intended or if you suspect the use of the On-body Injector resulted in a missed or partial dose.  It is important that you put your used needles and syringes in a special sharps container. Do not put them in a trash can. If you do not have a sharps container, call your pharmacist or healthcare provider to get one.  Talk to your pediatrician regarding the use of this medicine in children. While this drug may be prescribed for  selected conditions, precautions do apply.  Overdosage: If you think you have taken too much of this medicine contact a poison control center or emergency room at once.  NOTE: This medicine is only for you. Do not share this medicine with others.  What if I miss a dose?  It is important not to miss your dose. Call your doctor or health care professional if you miss your dose. If you miss a dose due to an On-body Injector failure or leakage, a new dose should be administered as soon as possible using a single prefilled syringe for manual use.  What may interact with this medicine?  Interactions have not been studied.  Give your health care provider a list of all the medicines, herbs, non-prescription drugs, or dietary supplements you use. Also tell them if you smoke, drink alcohol, or use illegal drugs. Some items may interact with your medicine.  This list may not describe all possible interactions. Give your health care provider a list of all the medicines, herbs, non-prescription drugs, or dietary supplements you use. Also tell them if you smoke, drink alcohol, or use illegal drugs. Some items may interact with your medicine.  What should I watch for while using this medicine?  You may need blood work done while you are taking this medicine.  If you are going to need a MRI, CT scan, or other procedure, tell your doctor that you are using this medicine (On-Body Injector only).  What side effects may I notice from receiving this medicine?  Side effects that you should report to   your doctor or health care professional as soon as possible:  -allergic reactions like skin rash, itching or hives, swelling of the face, lips, or tongue  -back pain  -dizziness  -fever  -pain, redness, or irritation at site where injected  -pinpoint red spots on the skin  -red or dark-brown urine  -shortness of breath or breathing problems  -stomach or side pain, or pain at the shoulder  -swelling  -tiredness  -trouble passing urine or  change in the amount of urine  Side effects that usually do not require medical attention (report to your doctor or health care professional if they continue or are bothersome):  -bone pain  -muscle pain  This list may not describe all possible side effects. Call your doctor for medical advice about side effects. You may report side effects to FDA at 1-800-FDA-1088.  Where should I keep my medicine?  Keep out of the reach of children.  If you are using this medicine at home, you will be instructed on how to store it. Throw away any unused medicine after the expiration date on the label.  NOTE: This sheet is a summary. It may not cover all possible information. If you have questions about this medicine, talk to your doctor, pharmacist, or health care provider.   2019 Elsevier/Gold Standard (2017-04-03 16:57:08)

## 2018-02-11 ENCOUNTER — Other Ambulatory Visit: Payer: Self-pay | Admitting: Nurse Practitioner

## 2018-02-11 DIAGNOSIS — C20 Malignant neoplasm of rectum: Secondary | ICD-10-CM

## 2018-02-12 ENCOUNTER — Other Ambulatory Visit: Payer: Self-pay | Admitting: Hematology

## 2018-02-12 DIAGNOSIS — C20 Malignant neoplasm of rectum: Secondary | ICD-10-CM

## 2018-02-12 MED ORDER — MIRTAZAPINE 7.5 MG PO TABS: 7.5000 mg | ORAL_TABLET | Freq: Every day | ORAL | 0 refills | Status: DC

## 2018-02-19 NOTE — Progress Notes (Signed)
[place]   [phone] [phone]) [phone]   Clinic Follow up Note   Patient Care Team: Reynold Bowen, MD as PCP - General (Endocrinology) Lorretta Harp, MD as PCP - Cardiology (Cardiology)  Date of Service:  02/21/2018  CHIEF COMPLAINT:  F/u of Rectal cancer  SUMMARY OF ONCOLOGIC HISTORY: Oncology History   Cancer Staging Rectal cancer [facility]) Staging form: Colon and Rectum, AJCC 8th Edition - Clinical stage from 10/04/2017: Stage IIIB (cT3, cN1, cM0) - Signed by Truitt Merle, MD on 11/18/2017       Rectal cancer ([place])   10/04/2017 Procedure    Colonoscopy per Dr. Collene Mares 10/04/17 shows a large, non-obstructing horse-shoe shaped mass was found in the recto-sigmoid colon at 10 cm; the mass was partially circumferential involving one-half of the lumen circumference. The mass measured seven cm in length    10/04/2017 Initial Biopsy    Rectum, at 10-17 cm, mass biopsy -invasive adenocarcinoma, moderately differentiated  ICH stains for MLH1, MSH2, MSH6, and PMS2 are intact (normal)    10/04/2017 Cancer Staging    Staging form: Colon and Rectum, AJCC 8th Edition - Clinical stage from 10/04/2017: Stage IIIB (cT3, cN1, cM0) - Signed by Truitt Merle, MD on 11/18/2017    10/25/2017 Imaging    CT CAP IMPRESSION: 1. Irregular eccentric mass within the sigmoid colon. There are at least 3 abnormal appearing lymph node adjacent to the sigmoid colon at the level of the mass concerning for the possibility of local nodal metastatic disease. 2. Multiple pulmonary nodules as described above. These are indeterminate in etiology. Recommend attention on follow-up.    11/06/2017 Initial Diagnosis    Adenocarcinoma of colon ([place])    11/16/2017 Imaging    Pelvic MRI 11/16/17  IMPRESSION: Rectal adenocarcinoma T stage:   T3 Rectal adenocarcinoma N stage:  N1 Distance from tumor to the anal sphincter is 6.9 cm.    11/29/2017 -  Chemotherapy    totalneoadjuvant chemo FOLFOXq2  weeks x4 months, starting 11/20, followed by concurrent chemoRT    01/17/2018 Imaging    CT AP W Contrast 01/17/18 IMPRESSION: 1. Previously seen soft tissue mass involving the left lateral wall the upper rectum is no longer visualized. 2. Decreased size of left perirectal lymph nodes since prior exam. 3. No other sites of metastatic disease within the abdomen or pelvis. 4. Colonic diverticulosis, without radiographic evidence of diverticulitis. 5. Stable small adjacent paraumbilical hernias containing only fat.      CURRENT THERAPY:  neoadjuvant chemo FOLFOXq2 weeks x4 months, starting 11/20, followed by concurrent chemoRT   INTERVAL HISTORY:  Nicole Sellers is here for a follow up of treatment. She presents to the clinic today by herself. She notes her last infusion caused her to feel worse than previous infusions. She notes her throat has felt sore it hurts to swallow at a 10/10. This has improved over the past 5 days and then started to eat and drink. She was not eating and drinking for about 1 week. She did not try to call the clinic about this. She did try 2 ensure that whole week. She was able to keep some water down. She tried to use magic mouthwash and Listerine and warm salt water and cough drops and none of this gave her relief. She has been able to maintain weight. She is not interested in pain medication.  She notes having cold sensitivity and numbness on tips of fingers, no other neuropathy.     REVIEW OF  SYSTEMS:   Constitutional: Denies fevers, chills or abnormal weight loss Eyes: Denies blurriness of vision Ears, nose, mouth, throat, and face: Denies mucositis or sore throat Respiratory: Denies cough, dyspnea or wheezes Cardiovascular: Denies palpitation, chest discomfort or lower extremity swelling Gastrointestinal:  Denies nausea, heartburn or change in bowel habits Skin: Denies abnormal skin rashes Lymphatics: Denies new lymphadenopathy or easy  bruising Neurological:Denies new weaknesses (+) cold sensitivity (+0 numbness on tips of fingers  Behavioral/Psych: Mood is stable, no new changes  All other systems were reviewed with the patient and are negative.  MEDICAL HISTORY:  Past Medical History:  Diagnosis Date  . Allergy   . Anemia   . Arthritis    "joints sometimes" (12/25/2012)  . Clotting disorder ([place])   . Colon cancer ([place])   . Critical lower limb ischemia   . Gangrene of toe, Rt second toe 12/25/2012  . GERD (gastroesophageal reflux disease)   . High cholesterol   . Hypertension   . Hypothyroidism   . PAD (peripheral artery disease) ([place])   . S/P arterial stent, 12/25/13, successful diamondback orbital rotational arthrectomy, PTA using chocolate  balloon and stenting using I DEV stent of long segment calcified high-grade proximal and mid r 12/25/2012  . Tobacco abuse   . Type II diabetes mellitus ([place])     SURGICAL HISTORY: Past Surgical History:  Procedure Laterality Date  . ABDOMINAL AORTAGRAM  12/20/2012   Procedure: ABDOMINAL AORTAGRAM;  Surgeon: Lorretta Harp, MD;  Location: [facility] CATH LAB;  Service: Cardiovascular;;  . ABDOMINAL HYSTERECTOMY  2000  . ANGIOPLASTY / STENTING FEMORAL Right 12/25/2012  . ATHERECTOMY Right 12/25/2012   Procedure: ATHERECTOMY;  Surgeon: Lorretta Harp, MD;  Location: [facility] CATH LAB;  Service: Cardiovascular;  Laterality: Right;  right SFA  . HERNIA REPAIR    . LOWER EXTREMITY ANGIOGRAM N/A 12/20/2012   Procedure: LOWER EXTREMITY ANGIOGRAM;  Surgeon: Lorretta Harp, MD;  Location: [facility] CATH LAB;  Service: Cardiovascular;  Laterality: N/A;  . LOWER EXTREMITY ANGIOGRAM N/A 12/25/2012   Procedure: LOWER EXTREMITY ANGIOGRAM;  Surgeon: Lorretta Harp, MD;  Location: [facility] CATH LAB;  Service: Cardiovascular;  Laterality: N/A;  . LOWER EXTREMITY INTERVENTION  05/08/2017  . LOWER EXTREMITY INTERVENTION Bilateral 05/08/2017   Procedure: LOWER EXTREMITY INTERVENTION;  Surgeon: Lorretta Harp, MD;  Location: [facility] CV LAB;  Service: Cardiovascular;  Laterality: Bilateral;  . PERIPHERAL VASCULAR BALLOON ANGIOPLASTY Right 05/08/2017   Procedure: PERIPHERAL VASCULAR BALLOON ANGIOPLASTY;  Surgeon: Lorretta Harp, MD;  Location: [place] CV LAB;  Service: Cardiovascular;  Laterality: Right;  sfa  . PERIPHERAL VASCULAR INTERVENTION Right 05/08/2017   Procedure: PERIPHERAL VASCULAR INTERVENTION;  Surgeon: Lorretta Harp, MD;  Location: [place] CV LAB;  Service: Cardiovascular;  Laterality: Right;  ext iliac  . PORTACATH PLACEMENT Right 11/22/2017   Procedure: INSERTION PORT-A-CATH;  Surgeon: Leighton Ruff, MD;  Location: [facility] ORS;  Service: General;  Laterality: Right;  . TOE SURGERY Right    2n toe   . UMBILICAL HERNIA REPAIR  2000    I have reviewed the social history and family history with the patient and they are unchanged from previous note.  ALLERGIES:  is allergic to kiwi extract; lisinopril; and plavix [clopidogrel bisulfate].  MEDICATIONS:  Current Outpatient Medications  Medication Sig Dispense Refill  . acetaminophen (TYLENOL) 500 MG tablet Take 500 mg by mouth 2 (two) times daily as needed for moderate pain or headache.     . albuterol (PROVENTIL HFA;VENTOLIN  HFA) 108 (90 BASE) MCG/ACT inhaler Inhale 2 puffs into the lungs every 6 (six) hours as needed for wheezing or shortness of breath.    Marland Kitchen amLODipine (NORVASC) 5 MG tablet Take 1 tablet (5 mg total) by mouth daily. 90 tablet 2  . aspirin 81 MG tablet Take 81 mg by mouth daily.    Marland Kitchen atorvastatin (LIPITOR) 10 MG tablet Take 1 tablet (10 mg total) by mouth daily. 90 tablet 1  . Calcium Carbonate-Vitamin D (CALCIUM 600+D PO) Take 1 tablet by mouth daily.    . cetirizine (ZYRTEC) 10 MG tablet Take 10 mg by mouth daily.    . cholecalciferol (VITAMIN D) 1000 units tablet Take 4,000 Units by mouth daily.     . clopidogrel (PLAVIX) 75 MG tablet Take 1 tablet (75 mg total) by mouth daily with breakfast.  90 tablet 3  . diphenoxylate-atropine (LOMOTIL) 2.5-0.025 MG tablet Take 1-2 tablets by mouth 4 (four) times daily as needed for diarrhea or loose stools. 40 tablet 2  . FERREX 150 150 MG capsule Take 150 mg by mouth 2 (two) times daily.  11  . gabapentin (NEURONTIN) 300 MG capsule Take 300 mg by mouth daily.     Marland Kitchen glimepiride (AMARYL) 1 MG tablet Take 1 mg by mouth 2 (two) times daily.    Marland Kitchen levothyroxine (SYNTHROID, LEVOTHROID) 50 MCG tablet Take 1 tablet (50 mcg total) by mouth daily. 90 tablet 1  . lidocaine-prilocaine (EMLA) cream Apply to affected area once 30 g 3  . magic mouthwash w/lidocaine SOLN Take 5 mLs by mouth 3 (three) times daily. Swish and Spit 240 mL 0  . metFORMIN (GLUCOPHAGE-XR) 500 MG 24 hr tablet TAKE 2 TABLETS BY MOUTH  TWICE A DAY 60 tablet 0  . mirtazapine (REMERON) 7.5 MG tablet Take 1 tablet (7.5 mg total) by mouth at bedtime. If tolerating after 1 week, can increase to 2 tabs at night 30 tablet 0  . Misc Natural Products (OSTEO BI-FLEX ADV TRIPLE ST) TABS Take 1 tablet by mouth 2 (two) times daily.    . Multiple Vitamin (MULTIVITAMIN) capsule Take 1 capsule by mouth daily.    . ondansetron (ZOFRAN) 8 MG tablet Take 1 tablet (8 mg total) by mouth 2 (two) times daily as needed for refractory nausea / vomiting. Start on day 3 after chemotherapy. 30 tablet 1  . prochlorperazine (COMPAZINE) 10 MG tablet Take 1 tablet (10 mg total) by mouth every 6 (six) hours as needed (Nausea or vomiting). 30 tablet 1  . tetrahydrozoline-zinc (VISINE-AC) 0.05-0.25 % ophthalmic solution Place 1 drop into both eyes 2 (two) times daily as needed (dry eyes).    . Triamcinolone Acetonide (NASACORT ALLERGY 24HR NA) Place 1 spray into the nose daily as needed (allergies).    . triamterene-hydrochlorothiazide (MAXZIDE) 75-50 MG tablet Take 1 tablet by mouth daily.    Loura Pardon Salicylate (ASPERCREME EX) Apply 1 application topically daily as needed (joint pain).    . vitamin B-12 (CYANOCOBALAMIN)  500 MCG tablet Take 500 mcg by mouth daily.     No current facility-administered medications for this visit.    Facility-Administered Medications Ordered in Other Visits  Medication Dose Route Frequency Provider Last Rate Last Dose  . fluorouracil (ADRUCIL) 4,200 mg in sodium chloride 0.9 % 66 mL chemo infusion  2,200 mg/m2 (Treatment Plan Recorded) Intravenous 1 day or 1 dose Truitt Merle, MD   4,200 mg at 02/21/18 1359  . heparin lock flush 100 unit/mL  500 Units Intracatheter  Once PRN Alla Feeling, NP      . heparin lock flush 100 unit/mL  500 Units Intracatheter Once PRN Truitt Merle, MD      . sodium chloride flush (NS) 0.9 % injection 10 mL  10 mL Intracatheter Once PRN Alla Feeling, NP      . sodium chloride flush (NS) 0.9 % injection 10 mL  10 mL Intracatheter PRN Truitt Merle, MD        PHYSICAL EXAMINATION: ECOG PERFORMANCE STATUS: 2 - Symptomatic, <50% confined to bed  Vitals:   02/21/18 0902  BP: (!) 156/65  Pulse: 87  Resp: 18  Temp: 97.9 F (36.6 C)  SpO2: 100%   Filed Weights   02/21/18 0902  Weight: 159 lb 9.6 oz (72.4 kg)    GENERAL:alert, no distress and comfortable SKIN: skin color, texture, turgor are normal, no rashes or significant lesions EYES: normal, Conjunctiva are pink and non-injected, sclera clear OROPHARYNX:no exudate, no erythema and lips, buccal mucosa, and tongue normal  NECK: supple, thyroid normal size, non-tender, without nodularity LYMPH:  no palpable lymphadenopathy in the cervical, axillary or inguinal LUNGS: clear to auscultation and percussion with normal breathing effort HEART: regular rate & rhythm and no murmurs and no lower extremity edema ABDOMEN:abdomen soft, non-tender and normal bowel sounds Musculoskeletal:no cyanosis of digits and no clubbing  NEURO: alert & oriented x 3 with fluent speech, no focal motor/sensory deficits  LABORATORY DATA:  I have reviewed the data as listed CBC Latest Ref Rng & Units 02/21/2018 02/07/2018  01/24/2018  WBC 4.0 - 10.5 K/uL 12.4(H) 13.2(H) 10.2  Hemoglobin 12.0 - 15.0 g/dL 9.5(L) 9.6(L) 10.3(L)  Hematocrit 36.0 - 46.0 % 29.6(L) 28.7(L) 31.0(L)  Platelets 150 - 400 K/uL 177 219 268     CMP Latest Ref Rng & Units 02/21/2018 02/07/2018 01/24/2018  Glucose 70 - 99 mg/dL 117(H) 160(H) 174(H)  BUN 8 - 23 mg/dL _0 Creatinine 0.44 - 1.00 mg/dL 1.20(H) 1.19(H) 1.18(H)  Sodium 135 - 145 mmol/L 141 140 141  Potassium 3.5 - 5.1 mmol/L 3.7 3.6 3.5  Chloride 98 - 111 mmol/L 105 104 102  CO2 22 - 32 mmol/L _1 Calcium 8.9 - 10.3 mg/dL 9.4 8.7(L) 9.3  Total Protein 6.5 - 8.1 g/dL 6.4(L) 6.3(L) 6.5  Total Bilirubin 0.3 - 1.2 mg/dL 0.2(L) 0.3 0.3  Alkaline Phos 38 - 126 U/L 148(H) 138(H) 141(H)  AST 15 - 41 U/L 36 32 26  ALT 0 - 44 U/L _2 RADIOGRAPHIC STUDIES: I have personally reviewed the radiological images as listed and agreed with the findings in the report. No results found.   ASSESSMENT & PLAN:  Nicole Sellers is a 68 y.o. female with   1. Adenocarcinoma of the rectosigmoid colon, moderately differentiated,cT3N1M0, stage IIIB,MMR normal -Diagnosed in 09/2017.She is on neoadjuvantchemo FOLFOXwithudenyca, plan for 4 months followed by chemoRT and surgery -She have been tolerating chemo well until last cycle, with mildfatigue,controlleddiarrhea,low appetite and mild numbness in fingers from cold sensitivity, numbness at tips of fingers.  She unfortunately developed a severe sore throat after last cycle chemotherapy, likely from mucositis.  She did not respond to Magic mouthwash. it has resolved now.  -I will reduce chemo dose due to her progressing neuropathy and sore throat. -Given she has not been eating or drinking as much I will hold Maxzide for now. If she again developed severe mucositis after this cycle chemo, I strongly  encouraged her to contact clinic for IV Fluids and for pain medication. She understands.  -Labs reviewed, WBC at 12.4, Hg  at 9.5, ANC at 8.2. CMP showed mild stable elevated creatinine, CEA and iron panel are still pending. Will reduce dose due to mucositis and mild neuropathy.  -Will proceed with radiation after the next 2 cycles chemo  -F/u in 2 weeks.   2. Iron deficiency anemia, secondary to #1 -She is currently on2oral ironpills a day. Will continue. -She previously declined IV iron. -Hg at 9.5 today (02/21/18), overall stable.  3. Depression -Due to her diagnosis and treatment. -Currently on mirtazapine. -Her mood has been stable.   4. Diarrhea -She initially complained of constipation that has changed to diarrheaafter chemo. -She is currently on imodium as neededand diarrhea is controlled -Her bowel movements have improved with treatment and now and stable.   5. Decreased appetite, weight loss, Mucositis -Currently on Mirtazapine. -Continue to follow up with dietician Pamala Hurry -Iagainencouraged her to drink more Glucerna supplementsin addition to eat more.  -With recent sore throat secondary to treatment, she did not eat or drink for a week, she has been able to start eating over the past 5 days. Weight has been maintained.  -I will reduce chemo dose due to her severe mucositis.  I encouraged her to continue use Magic mouthwash after chemo -I offered pain medication and she declined. She has Tylenol she will continue to use.    Plan -lab reviewed, adequate cycle7 FOLFOXtoday, will reduce chemo dose due to severe mucositis  -lab, flush, f/u and treatment in 2 weeks for last cycle FOLFOX    No problem-specific Assessment & Plan notes found for this encounter.   No orders of the defined types were placed in this encounter.  All questions were answered. The patient knows to call the clinic with any problems, questions or concerns. No barriers to learning was detected. I spent 20 minutes counseling the patient face to face. The total time spent in the appointment was 25 minutes  and more than 50% was on counseling and review of test results     Truitt Merle, MD 02/21/2018   I, Joslyn Devon, am acting as scribe for Truitt Merle, MD.   I have reviewed the above documentation for accuracy and completeness, and I agree with the above.

## 2018-02-21 ENCOUNTER — Inpatient Hospital Stay (HOSPITAL_BASED_OUTPATIENT_CLINIC_OR_DEPARTMENT_OTHER): Payer: Medicare Other | Admitting: Hematology

## 2018-02-21 ENCOUNTER — Inpatient Hospital Stay: Payer: Medicare Other

## 2018-02-21 ENCOUNTER — Inpatient Hospital Stay: Payer: Medicare Other | Attending: Nurse Practitioner

## 2018-02-21 ENCOUNTER — Encounter: Payer: Self-pay | Admitting: Hematology

## 2018-02-21 ENCOUNTER — Telehealth: Payer: Self-pay | Admitting: Hematology

## 2018-02-21 VITALS — BP 156/65 | HR 87 | Temp 97.9°F | Resp 18 | Ht 66.0 in | Wt 159.6 lb

## 2018-02-21 DIAGNOSIS — K219 Gastro-esophageal reflux disease without esophagitis: Secondary | ICD-10-CM | POA: Insufficient documentation

## 2018-02-21 DIAGNOSIS — Z5111 Encounter for antineoplastic chemotherapy: Secondary | ICD-10-CM

## 2018-02-21 DIAGNOSIS — C20 Malignant neoplasm of rectum: Secondary | ICD-10-CM

## 2018-02-21 DIAGNOSIS — D509 Iron deficiency anemia, unspecified: Secondary | ICD-10-CM | POA: Insufficient documentation

## 2018-02-21 DIAGNOSIS — C189 Malignant neoplasm of colon, unspecified: Secondary | ICD-10-CM

## 2018-02-21 DIAGNOSIS — Z7689 Persons encountering health services in other specified circumstances: Secondary | ICD-10-CM

## 2018-02-21 DIAGNOSIS — Z79899 Other long term (current) drug therapy: Secondary | ICD-10-CM | POA: Insufficient documentation

## 2018-02-21 DIAGNOSIS — F17210 Nicotine dependence, cigarettes, uncomplicated: Secondary | ICD-10-CM

## 2018-02-21 DIAGNOSIS — R634 Abnormal weight loss: Secondary | ICD-10-CM | POA: Insufficient documentation

## 2018-02-21 DIAGNOSIS — I10 Essential (primary) hypertension: Secondary | ICD-10-CM

## 2018-02-21 DIAGNOSIS — I739 Peripheral vascular disease, unspecified: Secondary | ICD-10-CM | POA: Insufficient documentation

## 2018-02-21 DIAGNOSIS — K1230 Oral mucositis (ulcerative), unspecified: Secondary | ICD-10-CM | POA: Insufficient documentation

## 2018-02-21 DIAGNOSIS — R200 Anesthesia of skin: Secondary | ICD-10-CM | POA: Insufficient documentation

## 2018-02-21 DIAGNOSIS — Z7984 Long term (current) use of oral hypoglycemic drugs: Secondary | ICD-10-CM | POA: Diagnosis not present

## 2018-02-21 DIAGNOSIS — R197 Diarrhea, unspecified: Secondary | ICD-10-CM | POA: Insufficient documentation

## 2018-02-21 DIAGNOSIS — E7800 Pure hypercholesterolemia, unspecified: Secondary | ICD-10-CM | POA: Insufficient documentation

## 2018-02-21 DIAGNOSIS — F329 Major depressive disorder, single episode, unspecified: Secondary | ICD-10-CM | POA: Diagnosis not present

## 2018-02-21 DIAGNOSIS — E039 Hypothyroidism, unspecified: Secondary | ICD-10-CM

## 2018-02-21 DIAGNOSIS — E119 Type 2 diabetes mellitus without complications: Secondary | ICD-10-CM | POA: Diagnosis not present

## 2018-02-21 DIAGNOSIS — Z7982 Long term (current) use of aspirin: Secondary | ICD-10-CM

## 2018-02-21 DIAGNOSIS — C19 Malignant neoplasm of rectosigmoid junction: Secondary | ICD-10-CM | POA: Insufficient documentation

## 2018-02-21 DIAGNOSIS — R630 Anorexia: Secondary | ICD-10-CM | POA: Insufficient documentation

## 2018-02-21 LAB — IRON AND TIBC
Iron: 84 ug/dL (ref 41–142)
Saturation Ratios: 32 % (ref 21–57)
TIBC: 260 ug/dL (ref 236–444)
UIBC: 176 ug/dL (ref 120–384)

## 2018-02-21 LAB — CBC WITH DIFFERENTIAL (CANCER CENTER ONLY)
Abs Immature Granulocytes: 0.2 10*3/uL — ABNORMAL HIGH (ref 0.00–0.07)
Basophils Absolute: 0.1 10*3/uL (ref 0.0–0.1)
Basophils Relative: 1 %
Eosinophils Absolute: 0.4 10*3/uL (ref 0.0–0.5)
Eosinophils Relative: 3 %
HCT: 29.6 % — ABNORMAL LOW (ref 36.0–46.0)
Hemoglobin: 9.5 g/dL — ABNORMAL LOW (ref 12.0–15.0)
Immature Granulocytes: 2 %
Lymphocytes Relative: 22 %
Lymphs Abs: 2.7 10*3/uL (ref 0.7–4.0)
MCH: 31.1 pg (ref 26.0–34.0)
MCHC: 32.1 g/dL (ref 30.0–36.0)
MCV: 97 fL (ref 80.0–100.0)
Monocytes Absolute: 0.9 10*3/uL (ref 0.1–1.0)
Monocytes Relative: 7 %
Neutro Abs: 8.2 10*3/uL — ABNORMAL HIGH (ref 1.7–7.7)
Neutrophils Relative %: 65 %
Platelet Count: 177 10*3/uL (ref 150–400)
RBC: 3.05 MIL/uL — ABNORMAL LOW (ref 3.87–5.11)
RDW: 16.6 % — ABNORMAL HIGH (ref 11.5–15.5)
WBC Count: 12.4 10*3/uL — ABNORMAL HIGH (ref 4.0–10.5)
nRBC: 0.2 % (ref 0.0–0.2)

## 2018-02-21 LAB — CMP (CANCER CENTER ONLY)
ALT: 19 U/L (ref 0–44)
AST: 36 U/L (ref 15–41)
Albumin: 3.4 g/dL — ABNORMAL LOW (ref 3.5–5.0)
Alkaline Phosphatase: 148 U/L — ABNORMAL HIGH (ref 38–126)
Anion gap: 10 (ref 5–15)
BUN: 13 mg/dL (ref 8–23)
CO2: 26 mmol/L (ref 22–32)
Calcium: 9.4 mg/dL (ref 8.9–10.3)
Chloride: 105 mmol/L (ref 98–111)
Creatinine: 1.2 mg/dL — ABNORMAL HIGH (ref 0.44–1.00)
GFR, Est AFR Am: 53 mL/min — ABNORMAL LOW (ref >60)
GFR, Estimated: 46 mL/min — ABNORMAL LOW (ref >60)
Glucose, Bld: 117 mg/dL — ABNORMAL HIGH (ref 70–99)
Potassium: 3.7 mmol/L (ref 3.5–5.1)
Sodium: 141 mmol/L (ref 135–145)
Total Bilirubin: 0.2 mg/dL — ABNORMAL LOW (ref 0.3–1.2)
Total Protein: 6.4 g/dL — ABNORMAL LOW (ref 6.5–8.1)

## 2018-02-21 LAB — FERRITIN: Ferritin: 622 ng/mL — ABNORMAL HIGH (ref 11–307)

## 2018-02-21 LAB — CEA (IN HOUSE-CHCC): CEA (CHCC-In House): 8.46 ng/mL — ABNORMAL HIGH (ref 0.00–5.00)

## 2018-02-21 MED ORDER — DEXTROSE 5 % IV SOLN
Freq: Once | INTRAVENOUS | Status: AC
  Administered 2018-02-21: 10:00:00 via INTRAVENOUS
  Filled 2018-02-21: qty 250

## 2018-02-21 MED ORDER — SODIUM CHLORIDE 0.9% FLUSH
10.0000 mL | INTRAVENOUS | Status: DC | PRN
  Filled 2018-02-21: qty 10

## 2018-02-21 MED ORDER — OXALIPLATIN CHEMO INJECTION 100 MG/20ML
75.0000 mg/m2 | Freq: Once | INTRAVENOUS | Status: AC
  Administered 2018-02-21: 145 mg via INTRAVENOUS
  Filled 2018-02-21: qty 20

## 2018-02-21 MED ORDER — DEXAMETHASONE SODIUM PHOSPHATE 10 MG/ML IJ SOLN
INTRAMUSCULAR | Status: AC
  Filled 2018-02-21: qty 1

## 2018-02-21 MED ORDER — SODIUM CHLORIDE 0.9% FLUSH
10.0000 mL | Freq: Once | INTRAVENOUS | Status: AC | PRN
  Administered 2018-02-21: 10 mL
  Filled 2018-02-21: qty 10

## 2018-02-21 MED ORDER — DEXAMETHASONE SODIUM PHOSPHATE 10 MG/ML IJ SOLN
10.0000 mg | Freq: Once | INTRAMUSCULAR | Status: AC
  Administered 2018-02-21: 10 mg via INTRAVENOUS

## 2018-02-21 MED ORDER — SODIUM CHLORIDE 0.9% FLUSH
3.0000 mL | Freq: Once | INTRAVENOUS | Status: DC | PRN
  Filled 2018-02-21: qty 10

## 2018-02-21 MED ORDER — PALONOSETRON HCL INJECTION 0.25 MG/5ML
0.2500 mg | Freq: Once | INTRAVENOUS | Status: AC
  Administered 2018-02-21: 0.25 mg via INTRAVENOUS

## 2018-02-21 MED ORDER — PALONOSETRON HCL INJECTION 0.25 MG/5ML
INTRAVENOUS | Status: AC
  Filled 2018-02-21: qty 5

## 2018-02-21 MED ORDER — LEUCOVORIN CALCIUM INJECTION 350 MG
400.0000 mg/m2 | Freq: Once | INTRAVENOUS | Status: AC
  Administered 2018-02-21: 760 mg via INTRAVENOUS
  Filled 2018-02-21: qty 38

## 2018-02-21 MED ORDER — SODIUM CHLORIDE 0.9 % IV SOLN
2200.0000 mg/m2 | INTRAVENOUS | Status: DC
  Administered 2018-02-21: 4200 mg via INTRAVENOUS
  Filled 2018-02-21: qty 84

## 2018-02-21 MED ORDER — HEPARIN SOD (PORK) LOCK FLUSH 100 UNIT/ML IV SOLN
500.0000 [IU] | Freq: Once | INTRAVENOUS | Status: DC | PRN
  Filled 2018-02-21: qty 5

## 2018-02-21 NOTE — Patient Instructions (Signed)
[facility] Discharge Instructions for Patients Receiving Chemotherapy  Today you received the following chemotherapy agents Oxaliplatin, Leucovorin, 5FU  To help prevent nausea and vomiting after your treatment, we encourage you to take your nausea medication as directed   If you develop nausea and vomiting that is not controlled by your nausea medication, call the clinic.   BELOW ARE SYMPTOMS THAT SHOULD BE REPORTED IMMEDIATELY:  *FEVER GREATER THAN 100.5 F  *CHILLS WITH OR WITHOUT FEVER  NAUSEA AND VOMITING THAT IS NOT CONTROLLED WITH YOUR NAUSEA MEDICATION  *UNUSUAL SHORTNESS OF BREATH  *UNUSUAL BRUISING OR BLEEDING  TENDERNESS IN MOUTH AND THROAT WITH OR WITHOUT PRESENCE OF ULCERS  *URINARY PROBLEMS  *BOWEL PROBLEMS  UNUSUAL RASH Items with * indicate a potential emergency and should be followed up as soon as possible.  Feel free to call the clinic should you have any questions or concerns. The clinic phone number is [phone].  Please show the CHEMO ALERT CARD at check-in to the Emergency Department and triage nurse.   

## 2018-02-21 NOTE — Telephone Encounter (Signed)
No los per 2/12 .  Patient declined calendar and avs.

## 2018-02-23 ENCOUNTER — Inpatient Hospital Stay: Payer: Medicare Other

## 2018-02-23 VITALS — BP 128/62 | HR 80 | Temp 98.1°F | Resp 17

## 2018-02-23 DIAGNOSIS — C19 Malignant neoplasm of rectosigmoid junction: Secondary | ICD-10-CM | POA: Diagnosis not present

## 2018-02-23 DIAGNOSIS — C20 Malignant neoplasm of rectum: Secondary | ICD-10-CM

## 2018-02-23 MED ORDER — HEPARIN SOD (PORK) LOCK FLUSH 100 UNIT/ML IV SOLN
500.0000 [IU] | Freq: Once | INTRAVENOUS | Status: AC | PRN
  Administered 2018-02-23: 500 [IU]
  Filled 2018-02-23: qty 5

## 2018-02-23 MED ORDER — PEGFILGRASTIM-CBQV 6 MG/0.6ML ~~LOC~~ SOSY
PREFILLED_SYRINGE | SUBCUTANEOUS | Status: AC
  Filled 2018-02-23: qty 0.6

## 2018-02-23 MED ORDER — PEGFILGRASTIM-CBQV 6 MG/0.6ML ~~LOC~~ SOSY
6.0000 mg | PREFILLED_SYRINGE | Freq: Once | SUBCUTANEOUS | Status: AC
  Administered 2018-02-23: 6 mg via SUBCUTANEOUS

## 2018-02-23 MED ORDER — SODIUM CHLORIDE 0.9% FLUSH
10.0000 mL | INTRAVENOUS | Status: DC | PRN
  Administered 2018-02-23: 10 mL
  Filled 2018-02-23: qty 10

## 2018-02-23 NOTE — Patient Instructions (Signed)
Pegfilgrastim injection What is this medicine? PEGFILGRASTIM (PEG fil gra stim) is a long-acting granulocyte colony-stimulating factor that stimulates the growth of neutrophils, a type of white blood cell important in the body's fight against infection. It is used to reduce the incidence of fever and infection in patients with certain types of cancer who are receiving chemotherapy that affects the bone marrow, and to increase survival after being exposed to high doses of radiation. This medicine may be used for other purposes; ask your health care provider or pharmacist if you have questions. COMMON BRAND NAME(S): Neulasta, Udencya What should I tell my health care provider before I take this medicine? They need to know if you have any of these conditions: -kidney disease -latex allergy -ongoing radiation therapy -sickle cell disease -skin reactions to acrylic adhesives (On-Body Injector only) -an unusual or allergic reaction to pegfilgrastim, filgrastim, other medicines, foods, dyes, or preservatives -pregnant or trying to get pregnant -breast-feeding How should I use this medicine? This medicine is for injection under the skin. If you get this medicine at home, you will be taught how to prepare and give the pre-filled syringe or how to use the On-body Injector. Refer to the patient Instructions for Use for detailed instructions. Use exactly as directed. Tell your healthcare provider immediately if you suspect that the On-body Injector may not have performed as intended or if you suspect the use of the On-body Injector resulted in a missed or partial dose. It is important that you put your used needles and syringes in a special sharps container. Do not put them in a trash can. If you do not have a sharps container, call your pharmacist or healthcare provider to get one. Talk to your pediatrician regarding the use of this medicine in children. While this drug may be prescribed for selected  conditions, precautions do apply. Overdosage: If you think you have taken too much of this medicine contact a poison control center or emergency room at once. NOTE: This medicine is only for you. Do not share this medicine with others. What if I miss a dose? It is important not to miss your dose. Call your doctor or health care professional if you miss your dose. If you miss a dose due to an On-body Injector failure or leakage, a new dose should be administered as soon as possible using a single prefilled syringe for manual use. What may interact with this medicine? Interactions have not been studied. Give your health care provider a list of all the medicines, herbs, non-prescription drugs, or dietary supplements you use. Also tell them if you smoke, drink alcohol, or use illegal drugs. Some items may interact with your medicine. This list may not describe all possible interactions. Give your health care provider a list of all the medicines, herbs, non-prescription drugs, or dietary supplements you use. Also tell them if you smoke, drink alcohol, or use illegal drugs. Some items may interact with your medicine. What should I watch for while using this medicine? You may need blood work done while you are taking this medicine. If you are going to need a MRI, CT scan, or other procedure, tell your doctor that you are using this medicine (On-Body Injector only). What side effects may I notice from receiving this medicine? Side effects that you should report to your doctor or health care professional as soon as possible: -allergic reactions like skin rash, itching or hives, swelling of the face, lips, or tongue -dizziness -fever -pain, redness, or irritation at  site where injected -pinpoint red spots on the skin -red or dark-brown urine -shortness of breath or breathing problems -stomach or side pain, or pain at the shoulder -swelling -tiredness -trouble passing urine or change in the amount of  urine Side effects that usually do not require medical attention (report to your doctor or health care professional if they continue or are bothersome): -bone pain -muscle pain This list may not describe all possible side effects. Call your doctor for medical advice about side effects. You may report side effects to FDA at 1-800-FDA-1088. Where should I keep my medicine? Keep out of the reach of children. Store pre-filled syringes in a refrigerator between 2 and 8 degrees C (36 and 46 degrees F). Do not freeze. Keep in carton to protect from light. Throw away this medicine if it is left out of the refrigerator for more than 48 hours. Throw away any unused medicine after the expiration date. NOTE: This sheet is a summary. It may not cover all possible information. If you have questions about this medicine, talk to your doctor, pharmacist, or health care provider.  2018 Elsevier/Gold Standard (2015-12-24 12:58:03)  

## 2018-02-25 ENCOUNTER — Other Ambulatory Visit: Payer: Self-pay | Admitting: Hematology

## 2018-02-25 DIAGNOSIS — C20 Malignant neoplasm of rectum: Secondary | ICD-10-CM

## 2018-03-05 NOTE — Progress Notes (Signed)
[place]   [phone]) [phone] Fax:(336) [phone]   Clinic Follow up Note   Patient Care Team: Reynold Bowen, MD as PCP - General (Endocrinology) Lorretta Harp, MD as PCP - Cardiology (Cardiology)  Date of Service:  03/07/2018  CHIEF COMPLAINT: F/u of Rectal cancer  SUMMARY OF ONCOLOGIC HISTORY: Oncology History   Cancer Staging Rectal cancer [facility]) Staging form: Colon and Rectum, AJCC 8th Edition - Clinical stage from 10/04/2017: Stage IIIB (cT3, cN1, cM0) - Signed by Truitt Merle, MD on 11/18/2017       Rectal cancer ([place])   10/04/2017 Procedure    Colonoscopy per Dr. Collene Mares 10/04/17 shows a large, non-obstructing horse-shoe shaped mass was found in the recto-sigmoid colon at 10 cm; the mass was partially circumferential involving one-half of the lumen circumference. The mass measured seven cm in length    10/04/2017 Initial Biopsy    Rectum, at 10-17 cm, mass biopsy -invasive adenocarcinoma, moderately differentiated  ICH stains for MLH1, MSH2, MSH6, and PMS2 are intact (normal)    10/04/2017 Cancer Staging    Staging form: Colon and Rectum, AJCC 8th Edition - Clinical stage from 10/04/2017: Stage IIIB (cT3, cN1, cM0) - Signed by Truitt Merle, MD on 11/18/2017    10/25/2017 Imaging    CT CAP IMPRESSION: 1. Irregular eccentric mass within the sigmoid colon. There are at least 3 abnormal appearing lymph node adjacent to the sigmoid colon at the level of the mass concerning for the possibility of local nodal metastatic disease. 2. Multiple pulmonary nodules as described above. These are indeterminate in etiology. Recommend attention on follow-up.    11/06/2017 Initial Diagnosis    Adenocarcinoma of colon ([place])    11/16/2017 Imaging    Pelvic MRI 11/16/17  IMPRESSION: Rectal adenocarcinoma T stage:   T3 Rectal adenocarcinoma N stage:  N1 Distance from tumor to the anal sphincter is 6.9 cm.    11/29/2017 - 02/15/2018 Chemotherapy    totalneoadjuvant chemo  FOLFOXq2 weeks x4 months, starting 11/20, followed by concurrent chemoRT. Completed 8 cycles of FOLFOX on 03/07/18    01/17/2018 Imaging    CT AP W Contrast 01/17/18 IMPRESSION: 1. Previously seen soft tissue mass involving the left lateral wall the upper rectum is no longer visualized. 2. Decreased size of left perirectal lymph nodes since prior exam. 3. No other sites of metastatic disease within the abdomen or pelvis. 4. Colonic diverticulosis, without radiographic evidence of diverticulitis. 5. Stable small adjacent paraumbilical hernias containing only fat.      CURRENT THERAPY:  neoadjuvant chemo FOLFOXq2 weeks x4 months, starting 11/20, followed by concurrent chemoRTwith Xeloda starting in about 3 weeks.   INTERVAL HISTORY:  Nicole Sellers is here for a follow up of treatment. She presents to the clinic today by herself. She notes her appetite was slightly lower with mild nausea and loose stool, but this was manageable. She notes her tongue was sore but with mouth wash this was resolved.    REVIEW OF SYSTEMS:   Constitutional: Denies fevers, chills or abnormal weight loss Eyes: Denies blurriness of vision Ears, nose, mouth, throat, and face: Denies mucositis or sore throat Respiratory: Denies cough, dyspnea or wheezes Cardiovascular: Denies palpitation, chest discomfort or lower extremity swelling Gastrointestinal:  Denies nausea, heartburn or change in bowel habits Skin: Denies abnormal skin rashes Lymphatics: Denies new lymphadenopathy or easy bruising Neurological:Denies numbness, tingling or new weaknesses Behavioral/Psych: Mood is stable, no new changes  All other systems were reviewed with the patient and are  negative.  MEDICAL HISTORY:  Past Medical History:  Diagnosis Date  . Allergy   . Anemia   . Arthritis    "joints sometimes" (12/25/2012)  . Clotting disorder ([place])   . Colon cancer ([place])   . Critical lower limb ischemia   . Gangrene of toe, Rt second  toe 12/25/2012  . GERD (gastroesophageal reflux disease)   . High cholesterol   . Hypertension   . Hypothyroidism   . PAD (peripheral artery disease) ([place])   . S/P arterial stent, 12/25/13, successful diamondback orbital rotational arthrectomy, PTA using chocolate  balloon and stenting using I DEV stent of long segment calcified high-grade proximal and mid r 12/25/2012  . Tobacco abuse   . Type II diabetes mellitus ([place])     SURGICAL HISTORY: Past Surgical History:  Procedure Laterality Date  . ABDOMINAL AORTAGRAM  12/20/2012   Procedure: ABDOMINAL AORTAGRAM;  Surgeon: Lorretta Harp, MD;  Location: [facility] CATH LAB;  Service: Cardiovascular;;  . ABDOMINAL HYSTERECTOMY  2000  . ANGIOPLASTY / STENTING FEMORAL Right 12/25/2012  . ATHERECTOMY Right 12/25/2012   Procedure: ATHERECTOMY;  Surgeon: Lorretta Harp, MD;  Location: [facility] CATH LAB;  Service: Cardiovascular;  Laterality: Right;  right SFA  . HERNIA REPAIR    . LOWER EXTREMITY ANGIOGRAM N/A 12/20/2012   Procedure: LOWER EXTREMITY ANGIOGRAM;  Surgeon: Lorretta Harp, MD;  Location: [facility] CATH LAB;  Service: Cardiovascular;  Laterality: N/A;  . LOWER EXTREMITY ANGIOGRAM N/A 12/25/2012   Procedure: LOWER EXTREMITY ANGIOGRAM;  Surgeon: Lorretta Harp, MD;  Location: [facility] CATH LAB;  Service: Cardiovascular;  Laterality: N/A;  . LOWER EXTREMITY INTERVENTION  05/08/2017  . LOWER EXTREMITY INTERVENTION Bilateral 05/08/2017   Procedure: LOWER EXTREMITY INTERVENTION;  Surgeon: Lorretta Harp, MD;  Location: [place] CV LAB;  Service: Cardiovascular;  Laterality: Bilateral;  . PERIPHERAL VASCULAR BALLOON ANGIOPLASTY Right 05/08/2017   Procedure: PERIPHERAL VASCULAR BALLOON ANGIOPLASTY;  Surgeon: Lorretta Harp, MD;  Location: [place] CV LAB;  Service: Cardiovascular;  Laterality: Right;  sfa  . PERIPHERAL VASCULAR INTERVENTION Right 05/08/2017   Procedure: PERIPHERAL VASCULAR INTERVENTION;  Surgeon: Lorretta Harp, MD;  Location: [place] CV LAB;  Service: Cardiovascular;  Laterality: Right;  ext iliac  . PORTACATH PLACEMENT Right 11/22/2017   Procedure: INSERTION PORT-A-CATH;  Surgeon: Leighton Ruff, MD;  Location: [facility] ORS;  Service: General;  Laterality: Right;  . TOE SURGERY Right    2n toe   . UMBILICAL HERNIA REPAIR  2000    I have reviewed the social history and family history with the patient and they are unchanged from previous note.  ALLERGIES:  is allergic to kiwi extract; lisinopril; and plavix [clopidogrel bisulfate].  MEDICATIONS:  Current Outpatient Medications  Medication Sig Dispense Refill  . acetaminophen (TYLENOL) 500 MG tablet Take 500 mg by mouth 2 (two) times daily as needed for moderate pain or headache.     . albuterol (PROVENTIL HFA;VENTOLIN HFA) 108 (90 BASE) MCG/ACT inhaler Inhale 2 puffs into the lungs every 6 (six) hours as needed for wheezing or shortness of breath.    Marland Kitchen amLODipine (NORVASC) 5 MG tablet Take 1 tablet (5 mg total) by mouth daily. 90 tablet 2  . aspirin 81 MG tablet Take 81 mg by mouth daily.    Marland Kitchen atorvastatin (LIPITOR) 10 MG tablet Take 1 tablet (10 mg total) by mouth daily. 90 tablet 1  . Calcium Carbonate-Vitamin D (CALCIUM 600+D PO) Take 1 tablet by mouth daily.    Marland Kitchen  cetirizine (ZYRTEC) 10 MG tablet Take 10 mg by mouth daily.    . cholecalciferol (VITAMIN D) 1000 units tablet Take 4,000 Units by mouth daily.     . clopidogrel (PLAVIX) 75 MG tablet Take 1 tablet (75 mg total) by mouth daily with breakfast. 90 tablet 3  . diphenoxylate-atropine (LOMOTIL) 2.5-0.025 MG tablet Take 1-2 tablets by mouth 4 (four) times daily as needed for diarrhea or loose stools. 40 tablet 2  . FERREX 150 150 MG capsule Take 150 mg by mouth 2 (two) times daily.  11  . gabapentin (NEURONTIN) 300 MG capsule Take 300 mg by mouth daily.     Marland Kitchen glimepiride (AMARYL) 1 MG tablet Take 1 mg by mouth 2 (two) times daily.    Marland Kitchen levothyroxine (SYNTHROID, LEVOTHROID) 50 MCG tablet Take 1 tablet (50 mcg  total) by mouth daily. 90 tablet 1  . lidocaine-prilocaine (EMLA) cream Apply to affected area once 30 g 3  . magic mouthwash w/lidocaine SOLN Take 5 mLs by mouth 3 (three) times daily. Swish and Spit 240 mL 0  . metFORMIN (GLUCOPHAGE-XR) 500 MG 24 hr tablet TAKE 2 TABLETS BY MOUTH  TWICE A DAY 60 tablet 0  . mirtazapine (REMERON) 7.5 MG tablet Take 2 tablets (15 mg total) by mouth at bedtime. 60 tablet 2  . Misc Natural Products (OSTEO BI-FLEX ADV TRIPLE ST) TABS Take 1 tablet by mouth 2 (two) times daily.    . Multiple Vitamin (MULTIVITAMIN) capsule Take 1 capsule by mouth daily.    . ondansetron (ZOFRAN) 8 MG tablet Take 1 tablet (8 mg total) by mouth 2 (two) times daily as needed for refractory nausea / vomiting. Start on day 3 after chemotherapy. 30 tablet 1  . prochlorperazine (COMPAZINE) 10 MG tablet Take 1 tablet (10 mg total) by mouth every 6 (six) hours as needed (Nausea or vomiting). 30 tablet 1  . tetrahydrozoline-zinc (VISINE-AC) 0.05-0.25 % ophthalmic solution Place 1 drop into both eyes 2 (two) times daily as needed (dry eyes).    . Triamcinolone Acetonide (NASACORT ALLERGY 24HR NA) Place 1 spray into the nose daily as needed (allergies).    . triamterene-hydrochlorothiazide (MAXZIDE) 75-50 MG tablet Take 1 tablet by mouth daily.    Loura Pardon Salicylate (ASPERCREME EX) Apply 1 application topically daily as needed (joint pain).    . vitamin B-12 (CYANOCOBALAMIN) 500 MCG tablet Take 500 mcg by mouth daily.    . capecitabine (XELODA) 500 MG tablet Take 3 tablets (1,500 mg total) by mouth 2 (two) times daily after a meal. Take on the days of radiation, Monday through Friday 90 tablet 1   No current facility-administered medications for this visit.    Facility-Administered Medications Ordered in Other Visits  Medication Dose Route Frequency Provider Last Rate Last Dose  . fluorouracil (ADRUCIL) 4,200 mg in sodium chloride 0.9 % 66 mL chemo infusion  2,200 mg/m2 (Treatment Plan  Recorded) Intravenous 1 day or 1 dose Truitt Merle, MD      . heparin lock flush 100 unit/mL  500 Units Intracatheter Once PRN Cira Rue K, NP      . sodium chloride flush (NS) 0.9 % injection 10 mL  10 mL Intracatheter Once PRN Alla Feeling, NP        PHYSICAL EXAMINATION: ECOG PERFORMANCE STATUS: 1 - Symptomatic but completely ambulatory  Vitals:   03/07/18 0917  BP: (!) 153/71  Pulse: 85  Resp: 18  Temp: 97.7 F (36.5 C)  SpO2: 100%   Filed  Weights   03/07/18 0917  Weight: 157 lb 9.6 oz (71.5 kg)    GENERAL:alert, no distress and comfortable SKIN: skin color, texture, turgor are normal, no rashes or significant lesions EYES: normal, Conjunctiva are pink and non-injected, sclera clear OROPHARYNX:no exudate, no erythema and lips, buccal mucosa, and tongue normal  NECK: supple, thyroid normal size, non-tender, without nodularity LYMPH:  no palpable lymphadenopathy in the cervical, axillary or inguinal LUNGS: clear to auscultation and percussion with normal breathing effort HEART: regular rate & rhythm and no murmurs and no lower extremity edema ABDOMEN:abdomen soft, non-tender and normal bowel sounds Musculoskeletal:no cyanosis of digits and no clubbing  NEURO: alert & oriented x 3 with fluent speech, no focal motor/sensory deficits  LABORATORY DATA:  I have reviewed the data as listed CBC Latest Ref Rng & Units 03/07/2018 02/21/2018 02/07/2018  WBC 4.0 - 10.5 K/uL 13.7(H) 12.4(H) 13.2(H)  Hemoglobin 12.0 - 15.0 g/dL 9.4(L) 9.5(L) 9.6(L)  Hematocrit 36.0 - 46.0 % 28.7(L) 29.6(L) 28.7(L)  Platelets 150 - 400 K/uL 185 177 219     CMP Latest Ref Rng & Units 03/07/2018 02/21/2018 02/07/2018  Glucose 70 - 99 mg/dL 119(H) 117(H) 160(H)  BUN 8 - 23 mg/dL '10 13 11  '$ Creatinine 0.44 - 1.00 mg/dL 1.05(H) 1.20(H) 1.19(H)  Sodium 135 - 145 mmol/L 139 141 140  Potassium 3.5 - 5.1 mmol/L 3.7 3.7 3.6  Chloride 98 - 111 mmol/L 105 105 104  CO2 22 - 32 mmol/L '26 26 25  '$ Calcium 8.9 -  10.3 mg/dL 8.9 9.4 8.7(L)  Total Protein 6.5 - 8.1 g/dL 6.4(L) 6.4(L) 6.3(L)  Total Bilirubin 0.3 - 1.2 mg/dL 0.2(L) 0.2(L) 0.3  Alkaline Phos 38 - 126 U/L 157(H) 148(H) 138(H)  AST 15 - 41 U/L 29 36 32  ALT 0 - 44 U/L '16 19 20      '$ RADIOGRAPHIC STUDIES: I have personally reviewed the radiological images as listed and agreed with the findings in the report. No results found.   ASSESSMENT & PLAN:  Nicole Sellers is a 68 y.o. female with   1. Adenocarcinoma of the rectosigmoid colon, moderately differentiated,cT3N1M0, stage IIIB,MMR normal -Diagnosed in 09/2017.She is on neoadjuvantchemo FOLFOXwithudenyca, plan for 4 months followed by chemoRT and surgery -Labs reveiwed, WBC at 13.7, Hg at 9.4, ANC at 9.6, BG at 119, Cr at 1.05. Alk Phos at 157. I encouraged her to drink more water.  -Overall adequate to proceed with last cycle FOLFOX today  -Plan to start concurrent chemoRT with Xeloda in about 3 weeks. I will refer her back to rad/onc Dr. Lisbeth Renshaw  -I discussed the benefit and potential side effect of Xeloda with concurrent radiation, she agrees to proceed. -F/u in 2 weeks, to prepare starting chemoRT with Xeloda.   2. Iron deficiency anemia, secondary to #1 -She is currently on2oral ironpills a day.Will continue. -She previously declined IV iron. -Hg stable at 9.4 today (03/07/18)  3. Depression -Due to her diagnosis and treatment. -Currently on mirtazapine. -Her mood has been stable.  4. Diarrhea -She initially complained of constipation that has changed to diarrheaafter chemo. -She is currently on imodium as neededand diarrhea is controlled -Her bowel movements have improved with treatment and now stable.  5. Decreased appetite, weight loss, Mucositis -Currently on Mirtazapine. -Continue to follow up with dietician Pamala Hurry -Iagainencouraged herto drink more Glucernasupplementsin addition to eat more.  -overall her side effect from chemo has  improved, she is tolerating pretty well lately.  Her weight has been stable.  Plan -lab reviewed, adequate for last cycle FOLFOXtoday -Will send message to Dr. Lisbeth Renshaw for her radiation, will start about 3 weeks  -Lab, flush, f/u in 2 weeks  -I wrote a prescription of Xeloda 1500 mg twice daily with concurrent radiation, our oral pharmacist Evelena Peat will help to fill the medicine for her.  No problem-specific Assessment & Plan notes found for this encounter.   No orders of the defined types were placed in this encounter.  All questions were answered. The patient knows to call the clinic with any problems, questions or concerns. No barriers to learning was detected. I spent 20 minutes counseling the patient face to face. The total time spent in the appointment was 25 minutes and more than 50% was on counseling and review of test results     Truitt Merle, MD 03/07/2018   I, Joslyn Devon, am acting as scribe for Truitt Merle, MD.   I have reviewed the above documentation for accuracy and completeness, and I agree with the above.

## 2018-03-07 ENCOUNTER — Inpatient Hospital Stay: Payer: Medicare Other

## 2018-03-07 ENCOUNTER — Telehealth: Payer: Self-pay | Admitting: Hematology

## 2018-03-07 ENCOUNTER — Inpatient Hospital Stay (HOSPITAL_BASED_OUTPATIENT_CLINIC_OR_DEPARTMENT_OTHER): Payer: Medicare Other | Admitting: Hematology

## 2018-03-07 ENCOUNTER — Encounter: Payer: Self-pay | Admitting: Hematology

## 2018-03-07 VITALS — BP 153/71 | HR 85 | Temp 97.7°F | Resp 18 | Ht 66.0 in | Wt 157.6 lb

## 2018-03-07 DIAGNOSIS — F329 Major depressive disorder, single episode, unspecified: Secondary | ICD-10-CM

## 2018-03-07 DIAGNOSIS — Z5111 Encounter for antineoplastic chemotherapy: Secondary | ICD-10-CM

## 2018-03-07 DIAGNOSIS — Z7984 Long term (current) use of oral hypoglycemic drugs: Secondary | ICD-10-CM

## 2018-03-07 DIAGNOSIS — R630 Anorexia: Secondary | ICD-10-CM

## 2018-03-07 DIAGNOSIS — Z7689 Persons encountering health services in other specified circumstances: Secondary | ICD-10-CM

## 2018-03-07 DIAGNOSIS — I10 Essential (primary) hypertension: Secondary | ICD-10-CM

## 2018-03-07 DIAGNOSIS — R200 Anesthesia of skin: Secondary | ICD-10-CM

## 2018-03-07 DIAGNOSIS — Z7982 Long term (current) use of aspirin: Secondary | ICD-10-CM

## 2018-03-07 DIAGNOSIS — C20 Malignant neoplasm of rectum: Secondary | ICD-10-CM

## 2018-03-07 DIAGNOSIS — I6522 Occlusion and stenosis of left carotid artery: Secondary | ICD-10-CM

## 2018-03-07 DIAGNOSIS — I739 Peripheral vascular disease, unspecified: Secondary | ICD-10-CM

## 2018-03-07 DIAGNOSIS — E7800 Pure hypercholesterolemia, unspecified: Secondary | ICD-10-CM

## 2018-03-07 DIAGNOSIS — Z79899 Other long term (current) drug therapy: Secondary | ICD-10-CM

## 2018-03-07 DIAGNOSIS — R634 Abnormal weight loss: Secondary | ICD-10-CM

## 2018-03-07 DIAGNOSIS — R197 Diarrhea, unspecified: Secondary | ICD-10-CM

## 2018-03-07 DIAGNOSIS — C19 Malignant neoplasm of rectosigmoid junction: Secondary | ICD-10-CM | POA: Diagnosis not present

## 2018-03-07 DIAGNOSIS — E119 Type 2 diabetes mellitus without complications: Secondary | ICD-10-CM

## 2018-03-07 DIAGNOSIS — K1230 Oral mucositis (ulcerative), unspecified: Secondary | ICD-10-CM

## 2018-03-07 DIAGNOSIS — E039 Hypothyroidism, unspecified: Secondary | ICD-10-CM

## 2018-03-07 DIAGNOSIS — D509 Iron deficiency anemia, unspecified: Secondary | ICD-10-CM

## 2018-03-07 DIAGNOSIS — K219 Gastro-esophageal reflux disease without esophagitis: Secondary | ICD-10-CM

## 2018-03-07 DIAGNOSIS — F17210 Nicotine dependence, cigarettes, uncomplicated: Secondary | ICD-10-CM

## 2018-03-07 LAB — CBC WITH DIFFERENTIAL (CANCER CENTER ONLY)
Abs Immature Granulocytes: 0.21 10*3/uL — ABNORMAL HIGH (ref 0.00–0.07)
Basophils Absolute: 0.1 10*3/uL (ref 0.0–0.1)
Basophils Relative: 1 %
Eosinophils Absolute: 0.3 10*3/uL (ref 0.0–0.5)
Eosinophils Relative: 2 %
HCT: 28.7 % — ABNORMAL LOW (ref 36.0–46.0)
Hemoglobin: 9.4 g/dL — ABNORMAL LOW (ref 12.0–15.0)
Immature Granulocytes: 2 %
Lymphocytes Relative: 18 %
Lymphs Abs: 2.5 10*3/uL (ref 0.7–4.0)
MCH: 32.1 pg (ref 26.0–34.0)
MCHC: 32.8 g/dL (ref 30.0–36.0)
MCV: 98 fL (ref 80.0–100.0)
Monocytes Absolute: 1 10*3/uL (ref 0.1–1.0)
Monocytes Relative: 7 %
Neutro Abs: 9.6 10*3/uL — ABNORMAL HIGH (ref 1.7–7.7)
Neutrophils Relative %: 70 %
Platelet Count: 185 10*3/uL (ref 150–400)
RBC: 2.93 MIL/uL — ABNORMAL LOW (ref 3.87–5.11)
RDW: 15.5 % (ref 11.5–15.5)
WBC Count: 13.7 10*3/uL — ABNORMAL HIGH (ref 4.0–10.5)
nRBC: 0 % (ref 0.0–0.2)

## 2018-03-07 LAB — CMP (CANCER CENTER ONLY)
ALT: 16 U/L (ref 0–44)
AST: 29 U/L (ref 15–41)
Albumin: 3.3 g/dL — ABNORMAL LOW (ref 3.5–5.0)
Alkaline Phosphatase: 157 U/L — ABNORMAL HIGH (ref 38–126)
Anion gap: 8 (ref 5–15)
BUN: 10 mg/dL (ref 8–23)
CO2: 26 mmol/L (ref 22–32)
Calcium: 8.9 mg/dL (ref 8.9–10.3)
Chloride: 105 mmol/L (ref 98–111)
Creatinine: 1.05 mg/dL — ABNORMAL HIGH (ref 0.44–1.00)
GFR, Est AFR Am: >60 mL/min (ref >60)
GFR, Estimated: 54 mL/min — ABNORMAL LOW (ref >60)
Glucose, Bld: 119 mg/dL — ABNORMAL HIGH (ref 70–99)
Potassium: 3.7 mmol/L (ref 3.5–5.1)
Sodium: 139 mmol/L (ref 135–145)
Total Bilirubin: 0.2 mg/dL — ABNORMAL LOW (ref 0.3–1.2)
Total Protein: 6.4 g/dL — ABNORMAL LOW (ref 6.5–8.1)

## 2018-03-07 MED ORDER — PALONOSETRON HCL INJECTION 0.25 MG/5ML
INTRAVENOUS | Status: AC
  Filled 2018-03-07: qty 5

## 2018-03-07 MED ORDER — DEXAMETHASONE SODIUM PHOSPHATE 10 MG/ML IJ SOLN
10.0000 mg | Freq: Once | INTRAMUSCULAR | Status: AC
  Administered 2018-03-07: 10 mg via INTRAVENOUS

## 2018-03-07 MED ORDER — DEXTROSE 5 % IV SOLN
Freq: Once | INTRAVENOUS | Status: AC
  Administered 2018-03-07: 10:00:00 via INTRAVENOUS
  Filled 2018-03-07: qty 250

## 2018-03-07 MED ORDER — SODIUM CHLORIDE 0.9 % IV SOLN
2200.0000 mg/m2 | INTRAVENOUS | Status: DC
  Administered 2018-03-07: 4200 mg via INTRAVENOUS
  Filled 2018-03-07: qty 84

## 2018-03-07 MED ORDER — OXALIPLATIN CHEMO INJECTION 100 MG/20ML
75.0000 mg/m2 | Freq: Once | INTRAVENOUS | Status: AC
  Administered 2018-03-07: 145 mg via INTRAVENOUS
  Filled 2018-03-07: qty 20

## 2018-03-07 MED ORDER — PALONOSETRON HCL INJECTION 0.25 MG/5ML
0.2500 mg | Freq: Once | INTRAVENOUS | Status: AC
  Administered 2018-03-07: 0.25 mg via INTRAVENOUS

## 2018-03-07 MED ORDER — SODIUM CHLORIDE 0.9% FLUSH
10.0000 mL | Freq: Once | INTRAVENOUS | Status: AC | PRN
  Administered 2018-03-07: 10 mL
  Filled 2018-03-07: qty 10

## 2018-03-07 MED ORDER — LEUCOVORIN CALCIUM INJECTION 350 MG
400.0000 mg/m2 | Freq: Once | INTRAVENOUS | Status: AC
  Administered 2018-03-07: 760 mg via INTRAVENOUS
  Filled 2018-03-07: qty 38

## 2018-03-07 MED ORDER — CAPECITABINE 500 MG PO TABS: 825.0000 mg/m2 | ORAL_TABLET | Freq: Two times a day (BID) | ORAL | 1 refills | Status: DC

## 2018-03-07 MED ORDER — DEXAMETHASONE SODIUM PHOSPHATE 10 MG/ML IJ SOLN
INTRAMUSCULAR | Status: AC
  Filled 2018-03-07: qty 1

## 2018-03-07 NOTE — Patient Instructions (Signed)
[facility] Discharge Instructions for Patients Receiving Chemotherapy  Today you received the following chemotherapy agents Oxaliplatin, Leucovorin, 5FU  To help prevent nausea and vomiting after your treatment, we encourage you to take your nausea medication as directed   If you develop nausea and vomiting that is not controlled by your nausea medication, call the clinic.   BELOW ARE SYMPTOMS THAT SHOULD BE REPORTED IMMEDIATELY:  *FEVER GREATER THAN 100.5 F  *CHILLS WITH OR WITHOUT FEVER  NAUSEA AND VOMITING THAT IS NOT CONTROLLED WITH YOUR NAUSEA MEDICATION  *UNUSUAL SHORTNESS OF BREATH  *UNUSUAL BRUISING OR BLEEDING  TENDERNESS IN MOUTH AND THROAT WITH OR WITHOUT PRESENCE OF ULCERS  *URINARY PROBLEMS  *BOWEL PROBLEMS  UNUSUAL RASH Items with * indicate a potential emergency and should be followed up as soon as possible.  Feel free to call the clinic should you have any questions or concerns. The clinic phone number is [phone].  Please show the CHEMO ALERT CARD at check-in to the Emergency Department and triage nurse.   

## 2018-03-07 NOTE — Telephone Encounter (Signed)
Gave avs and calendar ° °

## 2018-03-08 ENCOUNTER — Telehealth: Payer: Self-pay | Admitting: Pharmacist

## 2018-03-08 DIAGNOSIS — C20 Malignant neoplasm of rectum: Secondary | ICD-10-CM

## 2018-03-08 MED ORDER — CAPECITABINE 500 MG PO TABS: 825.0000 mg/m2 | ORAL_TABLET | Freq: Two times a day (BID) | ORAL | 1 refills | Status: DC

## 2018-03-08 NOTE — Telephone Encounter (Signed)
Oral Oncology Pharmacist Encounter  Received new prescription for Xeloda (capecitabine) for the continued neoadjuvant treatment of stage IIIB rectal cancer in conjunction with radiation, planned duration 5 1/2 weeks (28 treatment days).  Original diagnosis in oct 2019 Patient received neoadjuvant FOLFOX x 8 cycles Nov 2019-Feb 2020, with regression of tumor burden noted on CT scan performed 01/17/2018  Labs from 03/07/2018 assessed, OK for treatment. SCr=1.05, est CrCl ~ 50 mL/min  Xeloda is planned to be dosed at ~ 825 mg/m2 by mouth twice daily in the days of radiation only, Monday - Friday Planned start date is within the next 2-3 weeks.  Current medication list in Epic reviewed, no DDIs with Xeloda identified.  Prescription has been e-scribed to the [facility] for benefits analysis and approval.  Oral Oncology Clinic will continue to follow for insurance authorization, copayment issues, initial counseling and start date.  Johny Drilling, PharmD, BCPS, BCOP  03/08/2018 1:06 PM Oral Oncology Clinic [phone]

## 2018-03-09 ENCOUNTER — Inpatient Hospital Stay: Payer: Medicare Other

## 2018-03-09 VITALS — BP 156/66 | HR 86 | Resp 18

## 2018-03-09 DIAGNOSIS — C19 Malignant neoplasm of rectosigmoid junction: Secondary | ICD-10-CM | POA: Diagnosis not present

## 2018-03-09 DIAGNOSIS — Z95828 Presence of other vascular implants and grafts: Secondary | ICD-10-CM

## 2018-03-09 DIAGNOSIS — C20 Malignant neoplasm of rectum: Secondary | ICD-10-CM

## 2018-03-09 MED ORDER — PEGFILGRASTIM-CBQV 6 MG/0.6ML ~~LOC~~ SOSY
6.0000 mg | PREFILLED_SYRINGE | Freq: Once | SUBCUTANEOUS | Status: AC
  Administered 2018-03-09: 6 mg via SUBCUTANEOUS

## 2018-03-09 MED ORDER — SODIUM CHLORIDE 0.9% FLUSH
10.0000 mL | INTRAVENOUS | Status: DC | PRN
  Administered 2018-03-09: 10 mL via INTRAVENOUS
  Filled 2018-03-09: qty 10

## 2018-03-09 MED ORDER — HEPARIN SOD (PORK) LOCK FLUSH 100 UNIT/ML IV SOLN
500.0000 [IU] | Freq: Once | INTRAVENOUS | Status: AC
  Administered 2018-03-09: 500 [IU] via INTRAVENOUS
  Filled 2018-03-09: qty 5

## 2018-03-09 MED ORDER — PEGFILGRASTIM-CBQV 6 MG/0.6ML ~~LOC~~ SOSY
PREFILLED_SYRINGE | SUBCUTANEOUS | Status: AC
  Filled 2018-03-09: qty 0.6

## 2018-03-09 NOTE — Patient Instructions (Signed)
Pegfilgrastim injection  What is this medicine?  PEGFILGRASTIM (PEG fil gra stim) is a long-acting granulocyte colony-stimulating factor that stimulates the growth of neutrophils, a type of white blood cell important in the body's fight against infection. It is used to reduce the incidence of fever and infection in patients with certain types of cancer who are receiving chemotherapy that affects the bone marrow, and to increase survival after being exposed to high doses of radiation.  This medicine may be used for other purposes; ask your health care provider or pharmacist if you have questions.  COMMON BRAND NAME(S): Fulphila, Neulasta, UDENYCA  What should I tell my health care provider before I take this medicine?  They need to know if you have any of these conditions:  -kidney disease  -latex allergy  -ongoing radiation therapy  -sickle cell disease  -skin reactions to acrylic adhesives (On-Body Injector only)  -an unusual or allergic reaction to pegfilgrastim, filgrastim, other medicines, foods, dyes, or preservatives  -pregnant or trying to get pregnant  -breast-feeding  How should I use this medicine?  This medicine is for injection under the skin. If you get this medicine at home, you will be taught how to prepare and give the pre-filled syringe or how to use the On-body Injector. Refer to the patient Instructions for Use for detailed instructions. Use exactly as directed. Tell your healthcare provider immediately if you suspect that the On-body Injector may not have performed as intended or if you suspect the use of the On-body Injector resulted in a missed or partial dose.  It is important that you put your used needles and syringes in a special sharps container. Do not put them in a trash can. If you do not have a sharps container, call your pharmacist or healthcare provider to get one.  Talk to your pediatrician regarding the use of this medicine in children. While this drug may be prescribed for  selected conditions, precautions do apply.  Overdosage: If you think you have taken too much of this medicine contact a poison control center or emergency room at once.  NOTE: This medicine is only for you. Do not share this medicine with others.  What if I miss a dose?  It is important not to miss your dose. Call your doctor or health care professional if you miss your dose. If you miss a dose due to an On-body Injector failure or leakage, a new dose should be administered as soon as possible using a single prefilled syringe for manual use.  What may interact with this medicine?  Interactions have not been studied.  Give your health care provider a list of all the medicines, herbs, non-prescription drugs, or dietary supplements you use. Also tell them if you smoke, drink alcohol, or use illegal drugs. Some items may interact with your medicine.  This list may not describe all possible interactions. Give your health care provider a list of all the medicines, herbs, non-prescription drugs, or dietary supplements you use. Also tell them if you smoke, drink alcohol, or use illegal drugs. Some items may interact with your medicine.  What should I watch for while using this medicine?  You may need blood work done while you are taking this medicine.  If you are going to need a MRI, CT scan, or other procedure, tell your doctor that you are using this medicine (On-Body Injector only).  What side effects may I notice from receiving this medicine?  Side effects that you should report to   your doctor or health care professional as soon as possible:  -allergic reactions like skin rash, itching or hives, swelling of the face, lips, or tongue  -back pain  -dizziness  -fever  -pain, redness, or irritation at site where injected  -pinpoint red spots on the skin  -red or dark-brown urine  -shortness of breath or breathing problems  -stomach or side pain, or pain at the shoulder  -swelling  -tiredness  -trouble passing urine or  change in the amount of urine  Side effects that usually do not require medical attention (report to your doctor or health care professional if they continue or are bothersome):  -bone pain  -muscle pain  This list may not describe all possible side effects. Call your doctor for medical advice about side effects. You may report side effects to FDA at 1-800-FDA-1088.  Where should I keep my medicine?  Keep out of the reach of children.  If you are using this medicine at home, you will be instructed on how to store it. Throw away any unused medicine after the expiration date on the label.  NOTE: This sheet is a summary. It may not cover all possible information. If you have questions about this medicine, talk to your doctor, pharmacist, or health care provider.   2019 Elsevier/Gold Standard (2017-04-03 16:57:08)

## 2018-03-12 NOTE — Telephone Encounter (Signed)
Oral Chemotherapy Pharmacist Encounter   Attempted to reach patient to provide update and offer for initial counseling on oral medication: Xeloda.  No answer. Left VM for patient to call back.   Thank you,  Johny Drilling, PharmD, BCPS, BCOP  03/12/2018   10:29 AM Oral Oncology Clinic [phone]

## 2018-03-13 NOTE — Telephone Encounter (Signed)
Oral Oncology Pharmacist Encounter  Spoke with patient to introduce myself and describe services of oral oncology navigation clinic. Patient recently completed neoadjuvant FOLFOX x 8 cycles for stage IIIB rectal cancer as is starting to feel much better, appetite is returning.  Patient is planned to start Xeloda plus radiation for continued neoadjuvant treatment within the next few weeks. Start date is currently unknown.  I will plan to counsel patient and provide details of Xeloda acquisition closer to patient's start date.  She is currently not yet scheduled for follow-up with radiation oncology or CT simulation.  Johny Drilling, PharmD, BCPS, BCOP  03/13/2018 1:47 PM Oral Oncology Clinic [phone]

## 2018-03-14 ENCOUNTER — Other Ambulatory Visit: Payer: Self-pay | Admitting: Cardiovascular Disease

## 2018-03-14 DIAGNOSIS — I739 Peripheral vascular disease, unspecified: Secondary | ICD-10-CM

## 2018-03-16 NOTE — Progress Notes (Signed)
[place]   [phone] Fax:(336) [phone]   Clinic Follow up Note   Patient Care Team: Reynold Bowen, MD as PCP - General (Endocrinology) Lorretta Harp, MD as PCP - Cardiology (Cardiology)  Date of Service:  03/21/2018  CHIEF COMPLAINT: F/u of Rectal cancer  SUMMARY OF ONCOLOGIC HISTORY: Oncology History   Cancer Staging Rectal cancer [facility] Staging form: Colon and Rectum, AJCC 8th Edition - Clinical stage from 10/04/2017: Stage IIIB (cT3, cN1, cM0) - Signed by Truitt Merle, MD on 11/18/2017       Rectal cancer ([place])   10/04/2017 Procedure    Colonoscopy per Dr. Collene Mares 10/04/17 shows a large, non-obstructing horse-shoe shaped mass was found in the recto-sigmoid colon at 10 cm; the mass was partially circumferential involving one-half of the lumen circumference. The mass measured seven cm in length    10/04/2017 Initial Biopsy    Rectum, at 10-17 cm, mass biopsy -invasive adenocarcinoma, moderately differentiated  ICH stains for MLH1, MSH2, MSH6, and PMS2 are intact (normal)    10/04/2017 Cancer Staging    Staging form: Colon and Rectum, AJCC 8th Edition - Clinical stage from 10/04/2017: Stage IIIB (cT3, cN1, cM0) - Signed by Truitt Merle, MD on 11/18/2017    10/25/2017 Imaging    CT CAP IMPRESSION: 1. Irregular eccentric mass within the sigmoid colon. There are at least 3 abnormal appearing lymph node adjacent to the sigmoid colon at the level of the mass concerning for the possibility of local nodal metastatic disease. 2. Multiple pulmonary nodules as described above. These are indeterminate in etiology. Recommend attention on follow-up.    11/06/2017 Initial Diagnosis    Adenocarcinoma of colon ([place])    11/16/2017 Imaging    Pelvic MRI 11/16/17  IMPRESSION: Rectal adenocarcinoma T stage:   T3 Rectal adenocarcinoma N stage:  N1 Distance from tumor to the anal sphincter is 6.9 cm.    11/29/2017 - 02/15/2018 Chemotherapy    totalneoadjuvant chemo  FOLFOXq2 weeks x4 months, starting 11/29/17-03/07/18, followed by concurrent chemoRT. Completed 8 cycles of FOLFOX on 03/07/18    01/17/2018 Imaging    CT AP W Contrast 01/17/18 IMPRESSION: 1. Previously seen soft tissue mass involving the left lateral wall the upper rectum is no longer visualized. 2. Decreased size of left perirectal lymph nodes since prior exam. 3. No other sites of metastatic disease within the abdomen or pelvis. 4. Colonic diverticulosis, without radiographic evidence of diverticulitis. 5. Stable small adjacent paraumbilical hernias containing only fat.      CURRENT THERAPY:  PENDING concurrent chemoRT with Xeloda '1500mg'$  BID on days of radiation M-F starting next week.   INTERVAL HISTORY:  Nicole Sellers is here for a follow up. She presents to the clinic today by herself. She notes she is doing well. She plans to Dr. Lisbeth Renshaw today. She is recovering well from neoadjuvant chemo. Her appetite is adequate, her neuropathy is resolving. She notes her copay Xeloda is $78, but her [facility] grant will cover this cost.    REVIEW OF SYSTEMS:   Constitutional: Denies fevers, chills or abnormal weight loss Eyes: Denies blurriness of vision Ears, nose, mouth, throat, and face: Denies mucositis or sore throat Respiratory: Denies cough, dyspnea or wheezes Cardiovascular: Denies palpitation, chest discomfort or lower extremity swelling Gastrointestinal:  Denies nausea, heartburn or change in bowel habits Skin: Denies abnormal skin rashes Lymphatics: Denies new lymphadenopathy or easy bruising Neurological:Denies numbness, tingling or new weaknesses Behavioral/Psych: Mood is stable, no new changes  All other systems  were reviewed with the patient and are negative.  MEDICAL HISTORY:  Past Medical History:  Diagnosis Date  . Allergy   . Anemia   . Arthritis    "joints sometimes" (12/25/2012)  . Clotting disorder ([place])   . Colon cancer ([place])   . Critical lower limb ischemia     . Gangrene of toe, Rt second toe 12/25/2012  . GERD (gastroesophageal reflux disease)   . High cholesterol   . Hypertension   . Hypothyroidism   . PAD (peripheral artery disease) ([place])   . S/P arterial stent, 12/25/13, successful diamondback orbital rotational arthrectomy, PTA using chocolate  balloon and stenting using I DEV stent of long segment calcified high-grade proximal and mid r 12/25/2012  . Tobacco abuse   . Type II diabetes mellitus ([place])     SURGICAL HISTORY: Past Surgical History:  Procedure Laterality Date  . ABDOMINAL AORTAGRAM  12/20/2012   Procedure: ABDOMINAL AORTAGRAM;  Surgeon: Lorretta Harp, MD;  Location: [facility] CATH LAB;  Service: Cardiovascular;;  . ABDOMINAL HYSTERECTOMY  2000  . ANGIOPLASTY / STENTING FEMORAL Right 12/25/2012  . ATHERECTOMY Right 12/25/2012   Procedure: ATHERECTOMY;  Surgeon: Lorretta Harp, MD;  Location: [facility] CATH LAB;  Service: Cardiovascular;  Laterality: Right;  right SFA  . HERNIA REPAIR    . LOWER EXTREMITY ANGIOGRAM N/A 12/20/2012   Procedure: LOWER EXTREMITY ANGIOGRAM;  Surgeon: Lorretta Harp, MD;  Location: [facility] CATH LAB;  Service: Cardiovascular;  Laterality: N/A;  . LOWER EXTREMITY ANGIOGRAM N/A 12/25/2012   Procedure: LOWER EXTREMITY ANGIOGRAM;  Surgeon: Lorretta Harp, MD;  Location: [facility] CATH LAB;  Service: Cardiovascular;  Laterality: N/A;  . LOWER EXTREMITY INTERVENTION  05/08/2017  . LOWER EXTREMITY INTERVENTION Bilateral 05/08/2017   Procedure: LOWER EXTREMITY INTERVENTION;  Surgeon: Lorretta Harp, MD;  Location: [place] CV LAB;  Service: Cardiovascular;  Laterality: Bilateral;  . PERIPHERAL VASCULAR BALLOON ANGIOPLASTY Right 05/08/2017   Procedure: PERIPHERAL VASCULAR BALLOON ANGIOPLASTY;  Surgeon: Lorretta Harp, MD;  Location: [place] CV LAB;  Service: Cardiovascular;  Laterality: Right;  sfa  . PERIPHERAL VASCULAR INTERVENTION Right 05/08/2017   Procedure: PERIPHERAL VASCULAR INTERVENTION;  Surgeon: Lorretta Harp, MD;  Location: [place] CV LAB;  Service: Cardiovascular;  Laterality: Right;  ext iliac  . PORTACATH PLACEMENT Right 11/22/2017   Procedure: INSERTION PORT-A-CATH;  Surgeon: Leighton Ruff, MD;  Location: [facility] ORS;  Service: General;  Laterality: Right;  . TOE SURGERY Right    2n toe   . UMBILICAL HERNIA REPAIR  2000    I have reviewed the social history and family history with the patient and they are unchanged from previous note.  ALLERGIES:  is allergic to kiwi extract; lisinopril; and plavix [clopidogrel bisulfate].  MEDICATIONS:  Current Outpatient Medications  Medication Sig Dispense Refill  . acetaminophen (TYLENOL) 500 MG tablet Take 500 mg by mouth 2 (two) times daily as needed for moderate pain or headache.     . albuterol (PROVENTIL HFA;VENTOLIN HFA) 108 (90 BASE) MCG/ACT inhaler Inhale 2 puffs into the lungs every 6 (six) hours as needed for wheezing or shortness of breath.    Marland Kitchen amLODipine (NORVASC) 5 MG tablet Take 1 tablet (5 mg total) by mouth daily. 90 tablet 2  . aspirin 81 MG tablet Take 81 mg by mouth daily.    Marland Kitchen atorvastatin (LIPITOR) 10 MG tablet Take 1 tablet (10 mg total) by mouth daily. 90 tablet 1  . Calcium Carbonate-Vitamin D (CALCIUM 600+D PO) Take  1 tablet by mouth daily.    . capecitabine (XELODA) 500 MG tablet Take 3 tablets (1,500 mg total) by mouth 2 (two) times daily after a meal. Take on the days of radiation only, Monday through Friday 90 tablet 1  . cetirizine (ZYRTEC) 10 MG tablet Take 10 mg by mouth daily.    . cholecalciferol (VITAMIN D) 1000 units tablet Take 4,000 Units by mouth daily.     . clopidogrel (PLAVIX) 75 MG tablet Take 1 tablet (75 mg total) by mouth daily with breakfast. 90 tablet 3  . diphenoxylate-atropine (LOMOTIL) 2.5-0.025 MG tablet Take 1-2 tablets by mouth 4 (four) times daily as needed for diarrhea or loose stools. 40 tablet 2  . FERREX 150 150 MG capsule Take 150 mg by mouth 2 (two) times daily.  11  . gabapentin  (NEURONTIN) 300 MG capsule Take 300 mg by mouth daily.     Marland Kitchen glimepiride (AMARYL) 1 MG tablet Take 1 mg by mouth 2 (two) times daily.    Marland Kitchen levothyroxine (SYNTHROID, LEVOTHROID) 50 MCG tablet Take 1 tablet (50 mcg total) by mouth daily. 90 tablet 1  . lidocaine-prilocaine (EMLA) cream Apply to affected area once 30 g 3  . magic mouthwash w/lidocaine SOLN Take 5 mLs by mouth 3 (three) times daily. Swish and Spit 240 mL 0  . metFORMIN (GLUCOPHAGE-XR) 500 MG 24 hr tablet TAKE 2 TABLETS BY MOUTH  TWICE A DAY 60 tablet 0  . mirtazapine (REMERON) 7.5 MG tablet Take 2 tablets (15 mg total) by mouth at bedtime. 60 tablet 2  . Misc Natural Products (OSTEO BI-FLEX ADV TRIPLE ST) TABS Take 1 tablet by mouth 2 (two) times daily.    . Multiple Vitamin (MULTIVITAMIN) capsule Take 1 capsule by mouth daily.    . ondansetron (ZOFRAN) 8 MG tablet Take 1 tablet (8 mg total) by mouth 2 (two) times daily as needed for refractory nausea / vomiting. Start on day 3 after chemotherapy. 30 tablet 1  . prochlorperazine (COMPAZINE) 10 MG tablet Take 1 tablet (10 mg total) by mouth every 6 (six) hours as needed (Nausea or vomiting). 30 tablet 1  . tetrahydrozoline-zinc (VISINE-AC) 0.05-0.25 % ophthalmic solution Place 1 drop into both eyes 2 (two) times daily as needed (dry eyes).    . Triamcinolone Acetonide (NASACORT ALLERGY 24HR NA) Place 1 spray into the nose daily as needed (allergies).    . triamterene-hydrochlorothiazide (MAXZIDE) 75-50 MG tablet Take 1 tablet by mouth daily.    Loura Pardon Salicylate (ASPERCREME EX) Apply 1 application topically daily as needed (joint pain).    . vitamin B-12 (CYANOCOBALAMIN) 500 MCG tablet Take 500 mcg by mouth daily.     No current facility-administered medications for this visit.    Facility-Administered Medications Ordered in Other Visits  Medication Dose Route Frequency Provider Last Rate Last Dose  . heparin lock flush 100 unit/mL  500 Units Intracatheter Once PRN Cira Rue  K, NP      . sodium chloride flush (NS) 0.9 % injection 10 mL  10 mL Intracatheter Once PRN Alla Feeling, NP        PHYSICAL EXAMINATION: ECOG PERFORMANCE STATUS: 1 - Symptomatic but completely ambulatory  Vitals:   03/21/18 0943  BP: (!) 142/76  Pulse: 86  Resp: 18  Temp: 98.1 F (36.7 C)  SpO2: 100%   Filed Weights   03/21/18 0943  Weight: 157 lb 11.2 oz (71.5 kg)    GENERAL:alert, no distress and comfortable SKIN: skin color,  texture, turgor are normal, no rashes or significant lesions EYES: normal, Conjunctiva are pink and non-injected, sclera clear OROPHARYNX:no exudate, no erythema and lips, buccal mucosa, and tongue normal  NECK: supple, thyroid normal size, non-tender, without nodularity LYMPH:  no palpable lymphadenopathy in the cervical, axillary or inguinal LUNGS: clear to auscultation and percussion with normal breathing effort HEART: regular rate & rhythm and no murmurs and no lower extremity edema ABDOMEN:abdomen soft, non-tender and normal bowel sounds Musculoskeletal:no cyanosis of digits and no clubbing  NEURO: alert & oriented x 3 with fluent speech, no focal motor/sensory deficits  LABORATORY DATA:  I have reviewed the data as listed CBC Latest Ref Rng & Units 03/21/2018 03/07/2018 02/21/2018  WBC 4.0 - 10.5 K/uL 9.6 13.7(H) 12.4(H)  Hemoglobin 12.0 - 15.0 g/dL 9.2(L) 9.4(L) 9.5(L)  Hematocrit 36.0 - 46.0 % 28.5(L) 28.7(L) 29.6(L)  Platelets 150 - 400 K/uL 185 185 177     CMP Latest Ref Rng & Units 03/21/2018 03/07/2018 02/21/2018  Glucose 70 - 99 mg/dL 107(H) 119(H) 117(H)  BUN 8 - 23 mg/dL '11 10 13  '$ Creatinine 0.44 - 1.00 mg/dL 0.97 1.05(H) 1.20(H)  Sodium 135 - 145 mmol/L 141 139 141  Potassium 3.5 - 5.1 mmol/L 3.9 3.7 3.7  Chloride 98 - 111 mmol/L 105 105 105  CO2 22 - 32 mmol/L '26 26 26  '$ Calcium 8.9 - 10.3 mg/dL 9.2 8.9 9.4  Total Protein 6.5 - 8.1 g/dL 6.4(L) 6.4(L) 6.4(L)  Total Bilirubin 0.3 - 1.2 mg/dL 0.3 0.2(L) 0.2(L)  Alkaline Phos 38  - 126 U/L 181(H) 157(H) 148(H)  AST 15 - 41 U/L 38 29 36  ALT 0 - 44 U/L '18 16 19      '$ RADIOGRAPHIC STUDIES: I have personally reviewed the radiological images as listed and agreed with the findings in the report. No results found.   ASSESSMENT & PLAN:  Nicole Sellers is a 68 y.o. female with   1. Adenocarcinoma of the rectosigmoid colon, moderately differentiated,cT3N1M0, stage IIIB,MMR normal -Diagnosed in 09/2017.She is on neoadjuvantchemo. She completed 4 months of  FOLFOXwithudenyca, plan for chemoRT with Xeloda before surgery.  -She is recovering well from neoadjuvant chemo. 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  -She will discuss her start of radiation with Dr. Lisbeth Renshaw today.  -I reviewed side effects of Xeloda with her again and reviewed management of skin toxicity, nausea and diarrhea. Plan to start chemoRT with Xeloda '1500mg'$  BID in 1-2 weeks -Labs reviewed, CBC, CMP, Iron panel WNL except hg at 9.2, BG at 107, Mildly low protein/albumin, Alk Phos at 181. CEA and Ferritin still pending.  -Will monitor her labs weekly when she is on chemoRT -F/u in 2 weeks   2. Iron deficiency anemia, secondary to #1 -She is currently on2oral ironpills a day.Will continue. -She previously declined IV iron. -Hg stable at 9.2 today (03/21/18)  3. Depression -Due to her diagnosis and treatment. -Currently on mirtazapine. -Her mood has been stable.  4. Diarrhea -She initially complained of constipation that has changed to diarrheaafter chemo. -resolved now      Plan -lab reviewed today  -Will proceed with start of concurrent chemoRT in 1-2 weeks, she sees Dr. Lisbeth Renshaw today to plan radiation  -Lab flush weekly X5 starting on 3/23 -F/u on 3/23 then every other week X2 more    No problem-specific Assessment & Plan notes found for this encounter.   No orders of the defined types were placed in this encounter.  All questions were answered. The patient knows  to call the clinic  with any problems, questions or concerns. No barriers to learning was detected. I spent 15 minutes counseling the patient face to face. The total time spent in the appointment was 20 minutes and more than 50% was on counseling and review of test results     Truitt Merle, MD 03/21/2018   I, Joslyn Devon, am acting as scribe for Truitt Merle, MD.   I have reviewed the above documentation for accuracy and completeness, and I agree with the above.

## 2018-03-21 ENCOUNTER — Telehealth: Payer: Self-pay | Admitting: Hematology

## 2018-03-21 ENCOUNTER — Inpatient Hospital Stay: Payer: Medicare Other

## 2018-03-21 ENCOUNTER — Ambulatory Visit
Admission: RE | Admit: 2018-03-21 | Discharge: 2018-03-21 | Disposition: A | Discharge status: Home / Self Care | Payer: Medicare Other | Source: Ambulatory Visit | Attending: Radiation Oncology | Admitting: Radiation Oncology

## 2018-03-21 ENCOUNTER — Other Ambulatory Visit: Payer: Self-pay

## 2018-03-21 ENCOUNTER — Encounter: Payer: Self-pay | Admitting: Radiation Oncology

## 2018-03-21 ENCOUNTER — Inpatient Hospital Stay: Payer: Medicare Other | Attending: Nurse Practitioner | Admitting: Hematology

## 2018-03-21 ENCOUNTER — Encounter: Payer: Self-pay | Admitting: Hematology

## 2018-03-21 ENCOUNTER — Ambulatory Visit: Payer: Medicare Other | Admitting: Radiation Oncology

## 2018-03-21 VITALS — BP 177/76 | HR 89 | Temp 98.0°F | Resp 18 | Wt 158.0 lb

## 2018-03-21 VITALS — BP 142/76 | HR 86 | Temp 98.1°F | Resp 18 | Ht 66.0 in | Wt 157.7 lb

## 2018-03-21 DIAGNOSIS — E119 Type 2 diabetes mellitus without complications: Secondary | ICD-10-CM | POA: Insufficient documentation

## 2018-03-21 DIAGNOSIS — K219 Gastro-esophageal reflux disease without esophagitis: Secondary | ICD-10-CM | POA: Insufficient documentation

## 2018-03-21 DIAGNOSIS — I10 Essential (primary) hypertension: Secondary | ICD-10-CM | POA: Insufficient documentation

## 2018-03-21 DIAGNOSIS — Z7984 Long term (current) use of oral hypoglycemic drugs: Secondary | ICD-10-CM | POA: Diagnosis not present

## 2018-03-21 DIAGNOSIS — F329 Major depressive disorder, single episode, unspecified: Secondary | ICD-10-CM | POA: Diagnosis not present

## 2018-03-21 DIAGNOSIS — C20 Malignant neoplasm of rectum: Secondary | ICD-10-CM | POA: Insufficient documentation

## 2018-03-21 DIAGNOSIS — K5730 Diverticulosis of large intestine without perforation or abscess without bleeding: Secondary | ICD-10-CM | POA: Diagnosis not present

## 2018-03-21 DIAGNOSIS — Z79899 Other long term (current) drug therapy: Secondary | ICD-10-CM | POA: Insufficient documentation

## 2018-03-21 DIAGNOSIS — R918 Other nonspecific abnormal finding of lung field: Secondary | ICD-10-CM | POA: Diagnosis not present

## 2018-03-21 DIAGNOSIS — I2510 Atherosclerotic heart disease of native coronary artery without angina pectoris: Secondary | ICD-10-CM | POA: Diagnosis not present

## 2018-03-21 DIAGNOSIS — E1140 Type 2 diabetes mellitus with diabetic neuropathy, unspecified: Secondary | ICD-10-CM | POA: Diagnosis not present

## 2018-03-21 DIAGNOSIS — E7800 Pure hypercholesterolemia, unspecified: Secondary | ICD-10-CM | POA: Diagnosis not present

## 2018-03-21 DIAGNOSIS — M129 Arthropathy, unspecified: Secondary | ICD-10-CM | POA: Diagnosis not present

## 2018-03-21 DIAGNOSIS — E039 Hypothyroidism, unspecified: Secondary | ICD-10-CM | POA: Diagnosis not present

## 2018-03-21 DIAGNOSIS — Z8042 Family history of malignant neoplasm of prostate: Secondary | ICD-10-CM | POA: Diagnosis not present

## 2018-03-21 DIAGNOSIS — Z87891 Personal history of nicotine dependence: Secondary | ICD-10-CM | POA: Insufficient documentation

## 2018-03-21 DIAGNOSIS — Z809 Family history of malignant neoplasm, unspecified: Secondary | ICD-10-CM | POA: Insufficient documentation

## 2018-03-21 DIAGNOSIS — E1151 Type 2 diabetes mellitus with diabetic peripheral angiopathy without gangrene: Secondary | ICD-10-CM | POA: Insufficient documentation

## 2018-03-21 DIAGNOSIS — C189 Malignant neoplasm of colon, unspecified: Secondary | ICD-10-CM

## 2018-03-21 DIAGNOSIS — D509 Iron deficiency anemia, unspecified: Secondary | ICD-10-CM | POA: Insufficient documentation

## 2018-03-21 DIAGNOSIS — F17210 Nicotine dependence, cigarettes, uncomplicated: Secondary | ICD-10-CM | POA: Insufficient documentation

## 2018-03-21 DIAGNOSIS — Z7982 Long term (current) use of aspirin: Secondary | ICD-10-CM | POA: Insufficient documentation

## 2018-03-21 LAB — CMP (CANCER CENTER ONLY)
ALT: 18 U/L (ref 0–44)
AST: 38 U/L (ref 15–41)
Albumin: 3.1 g/dL — ABNORMAL LOW (ref 3.5–5.0)
Alkaline Phosphatase: 181 U/L — ABNORMAL HIGH (ref 38–126)
Anion gap: 10 (ref 5–15)
BUN: 11 mg/dL (ref 8–23)
CO2: 26 mmol/L (ref 22–32)
Calcium: 9.2 mg/dL (ref 8.9–10.3)
Chloride: 105 mmol/L (ref 98–111)
Creatinine: 0.97 mg/dL (ref 0.44–1.00)
GFR, Est AFR Am: >60 mL/min (ref >60)
GFR, Estimated: 59 mL/min — ABNORMAL LOW (ref >60)
Glucose, Bld: 107 mg/dL — ABNORMAL HIGH (ref 70–99)
Potassium: 3.9 mmol/L (ref 3.5–5.1)
Sodium: 141 mmol/L (ref 135–145)
Total Bilirubin: 0.3 mg/dL (ref 0.3–1.2)
Total Protein: 6.4 g/dL — ABNORMAL LOW (ref 6.5–8.1)

## 2018-03-21 LAB — CBC WITH DIFFERENTIAL (CANCER CENTER ONLY)
Abs Immature Granulocytes: 0.1 10*3/uL — ABNORMAL HIGH (ref 0.00–0.07)
Basophils Absolute: 0.1 10*3/uL (ref 0.0–0.1)
Basophils Relative: 1 %
Eosinophils Absolute: 0.2 10*3/uL (ref 0.0–0.5)
Eosinophils Relative: 2 %
HCT: 28.5 % — ABNORMAL LOW (ref 36.0–46.0)
Hemoglobin: 9.2 g/dL — ABNORMAL LOW (ref 12.0–15.0)
Immature Granulocytes: 1 %
Lymphocytes Relative: 20 %
Lymphs Abs: 1.9 10*3/uL (ref 0.7–4.0)
MCH: 32.2 pg (ref 26.0–34.0)
MCHC: 32.3 g/dL (ref 30.0–36.0)
MCV: 99.7 fL (ref 80.0–100.0)
Monocytes Absolute: 0.8 10*3/uL (ref 0.1–1.0)
Monocytes Relative: 9 %
Neutro Abs: 6.4 10*3/uL (ref 1.7–7.7)
Neutrophils Relative %: 67 %
Platelet Count: 185 10*3/uL (ref 150–400)
RBC: 2.86 MIL/uL — ABNORMAL LOW (ref 3.87–5.11)
RDW: 14.7 % (ref 11.5–15.5)
WBC Count: 9.6 10*3/uL (ref 4.0–10.5)
nRBC: 0 % (ref 0.0–0.2)

## 2018-03-21 LAB — IRON AND TIBC
Iron: 74 ug/dL (ref 41–142)
Saturation Ratios: 31 % (ref 21–57)
TIBC: 241 ug/dL (ref 236–444)
UIBC: 167 ug/dL (ref 120–384)

## 2018-03-21 LAB — CEA (IN HOUSE-CHCC): CEA (CHCC-In House): 7.4 ng/mL — ABNORMAL HIGH (ref 0.00–5.00)

## 2018-03-21 LAB — FERRITIN: Ferritin: 568 ng/mL — ABNORMAL HIGH (ref 11–307)

## 2018-03-21 MED ORDER — HEPARIN SOD (PORK) LOCK FLUSH 100 UNIT/ML IV SOLN
500.0000 [IU] | Freq: Once | INTRAVENOUS | Status: AC | PRN
  Administered 2018-03-21: 500 [IU]
  Filled 2018-03-21: qty 5

## 2018-03-21 MED ORDER — SODIUM CHLORIDE 0.9% FLUSH
10.0000 mL | Freq: Once | INTRAVENOUS | Status: AC | PRN
  Administered 2018-03-21: 10 mL
  Filled 2018-03-21: qty 10

## 2018-03-21 MED FILL — CAPECITABINE 500 MG TABS: 500 | 21 days supply | Qty: 90 | Fill #0

## 2018-03-21 NOTE — Telephone Encounter (Signed)
Scheduled appt per 3/11 los. ° °Printed calendar and avs. °

## 2018-03-21 NOTE — Progress Notes (Signed)
Radiation Oncology         [phone] ________________________________  Name: Nicole Sellers        MRN: [id number]  Date of Service: 03/21/2018 DOB: 04-Oct-1950  ON:GEXBM, [name], MD  Truitt Merle, MD     REFERRING PHYSICIAN: Truitt Merle, MD   DIAGNOSIS: The encounter diagnosis was Rectal cancer [facility]).   HISTORY OF PRESENT ILLNESS: Nicole Sellers is a 68 y.o. female seen at the request of Dr. Burr Medico for a newly diagnosed rectal cancer. The patient had been experiencing rectal bleeding and diarrhea. She had a cologuard test that was positive and a colonoscopy on 10/04/17 revealed a horse-shoe shaped tumor about 10 cm from the anal verge. She had a biopsy that revealed an invasive, moderately differentiated adenocarcinoma. A CT C/A/P on 10/26/17 revealed an eccentric mass in the sigmoid colon and some adenopathy. She also had small pulmonary nodules that were indeterminate. Her CEA was 55.85 on 11/06/17. MRI of the pelvis on 11/16/17  revealed T3N1 disease. Shecompleted total neoadjuvant chemotherapy between 11/29/17 and 03/07/2018. She comes today to discuss proceeding with chemoRT.   PREVIOUS RADIATION THERAPY: No   PAST MEDICAL HISTORY:  Past Medical History:  Diagnosis Date  . Allergy   . Anemia   . Arthritis    "joints sometimes" (12/25/2012)  . Clotting disorder ([place])   . Colon cancer ([place])   . Critical lower limb ischemia   . Gangrene of toe, Rt second toe 12/25/2012  . GERD (gastroesophageal reflux disease)   . High cholesterol   . Hypertension   . Hypothyroidism   . PAD (peripheral artery disease) ([place])   . S/P arterial stent, 12/25/13, successful diamondback orbital rotational arthrectomy, PTA using chocolate  balloon and stenting using I DEV stent of long segment calcified high-grade proximal and mid r 12/25/2012  . Tobacco abuse   . Type II diabetes mellitus ([place])        PAST SURGICAL HISTORY: Past Surgical History:  Procedure Laterality Date  . ABDOMINAL  AORTAGRAM  12/20/2012   Procedure: ABDOMINAL AORTAGRAM;  Surgeon: Lorretta Harp, MD;  Location: [facility] CATH LAB;  Service: Cardiovascular;;  . ABDOMINAL HYSTERECTOMY  2000  . ANGIOPLASTY / STENTING FEMORAL Right 12/25/2012  . ATHERECTOMY Right 12/25/2012   Procedure: ATHERECTOMY;  Surgeon: Lorretta Harp, MD;  Location: [facility] CATH LAB;  Service: Cardiovascular;  Laterality: Right;  right SFA  . HERNIA REPAIR    . LOWER EXTREMITY ANGIOGRAM N/A 12/20/2012   Procedure: LOWER EXTREMITY ANGIOGRAM;  Surgeon: Lorretta Harp, MD;  Location: [facility] CATH LAB;  Service: Cardiovascular;  Laterality: N/A;  . LOWER EXTREMITY ANGIOGRAM N/A 12/25/2012   Procedure: LOWER EXTREMITY ANGIOGRAM;  Surgeon: Lorretta Harp, MD;  Location: [facility] CATH LAB;  Service: Cardiovascular;  Laterality: N/A;  . LOWER EXTREMITY INTERVENTION  05/08/2017  . LOWER EXTREMITY INTERVENTION Bilateral 05/08/2017   Procedure: LOWER EXTREMITY INTERVENTION;  Surgeon: Lorretta Harp, MD;  Location: [place] CV LAB;  Service: Cardiovascular;  Laterality: Bilateral;  . PERIPHERAL VASCULAR BALLOON ANGIOPLASTY Right 05/08/2017   Procedure: PERIPHERAL VASCULAR BALLOON ANGIOPLASTY;  Surgeon: Lorretta Harp, MD;  Location: [place] CV LAB;  Service: Cardiovascular;  Laterality: Right;  sfa  . PERIPHERAL VASCULAR INTERVENTION Right 05/08/2017   Procedure: PERIPHERAL VASCULAR INTERVENTION;  Surgeon: Lorretta Harp, MD;  Location: [place] CV LAB;  Service: Cardiovascular;  Laterality: Right;  ext iliac  . PORTACATH PLACEMENT Right 11/22/2017   Procedure: INSERTION PORT-A-CATH;  Surgeon: Leighton Ruff, MD;  Location: [facility] ORS;  Service: General;  Laterality: Right;  . TOE SURGERY Right    2n toe   . UMBILICAL HERNIA REPAIR  2000     FAMILY HISTORY:  Family History  Problem Relation Age of Onset  . Hypertension Mother   . Stroke Mother   . Cancer Father        prostate  . Mental illness Sister   . Diabetes Brother   . Hypertension  Brother   . Cancer Brother 61       prostate  . Diabetes Brother   . Hypertension Brother   . Cancer Brother        "tumors on neck and body"      SOCIAL HISTORY:  reports that she has quit smoking. Her smoking use included cigarettes. She has a 24.00 pack-year smoking history. She has never used smokeless tobacco. She reports that she does not drink alcohol or use drugs. The patient is single and lives in [place].    ALLERGIES: Kiwi extract; Lisinopril; and Plavix [clopidogrel bisulfate]   MEDICATIONS:  Current Outpatient Medications  Medication Sig Dispense Refill  . acetaminophen (TYLENOL) 500 MG tablet Take 500 mg by mouth 2 (two) times daily as needed for moderate pain or headache.     . albuterol (PROVENTIL HFA;VENTOLIN HFA) 108 (90 BASE) MCG/ACT inhaler Inhale 2 puffs into the lungs every 6 (six) hours as needed for wheezing or shortness of breath.    Marland Kitchen amLODipine (NORVASC) 5 MG tablet Take 1 tablet (5 mg total) by mouth daily. 90 tablet 2  . aspirin 81 MG tablet Take 81 mg by mouth daily.    Marland Kitchen atorvastatin (LIPITOR) 10 MG tablet Take 1 tablet (10 mg total) by mouth daily. 90 tablet 1  . Calcium Carbonate-Vitamin D (CALCIUM 600+D PO) Take 1 tablet by mouth daily.    . capecitabine (XELODA) 500 MG tablet Take 3 tablets (1,500 mg total) by mouth 2 (two) times daily after a meal. Take on the days of radiation only, Monday through Friday 90 tablet 1  . cetirizine (ZYRTEC) 10 MG tablet Take 10 mg by mouth daily.    . cholecalciferol (VITAMIN D) 1000 units tablet Take 4,000 Units by mouth daily.     . clopidogrel (PLAVIX) 75 MG tablet Take 1 tablet (75 mg total) by mouth daily with breakfast. 90 tablet 3  . diphenoxylate-atropine (LOMOTIL) 2.5-0.025 MG tablet Take 1-2 tablets by mouth 4 (four) times daily as needed for diarrhea or loose stools. 40 tablet 2  . FERREX 150 150 MG capsule Take 150 mg by mouth 2 (two) times daily.  11  . gabapentin (NEURONTIN) 300 MG capsule Take 300 mg  by mouth daily.     Marland Kitchen glimepiride (AMARYL) 1 MG tablet Take 1 mg by mouth 2 (two) times daily.    Marland Kitchen levothyroxine (SYNTHROID, LEVOTHROID) 50 MCG tablet Take 1 tablet (50 mcg total) by mouth daily. 90 tablet 1  . lidocaine-prilocaine (EMLA) cream Apply to affected area once 30 g 3  . magic mouthwash w/lidocaine SOLN Take 5 mLs by mouth 3 (three) times daily. Swish and Spit 240 mL 0  . metFORMIN (GLUCOPHAGE-XR) 500 MG 24 hr tablet TAKE 2 TABLETS BY MOUTH  TWICE A DAY 60 tablet 0  . mirtazapine (REMERON) 7.5 MG tablet Take 2 tablets (15 mg total) by mouth at bedtime. 60 tablet 2  . Misc Natural Products (OSTEO BI-FLEX ADV TRIPLE ST) TABS Take 1 tablet by mouth 2 (two) times daily.    Marland Kitchen  Multiple Vitamin (MULTIVITAMIN) capsule Take 1 capsule by mouth daily.    . ondansetron (ZOFRAN) 8 MG tablet Take 1 tablet (8 mg total) by mouth 2 (two) times daily as needed for refractory nausea / vomiting. Start on day 3 after chemotherapy. 30 tablet 1  . prochlorperazine (COMPAZINE) 10 MG tablet Take 1 tablet (10 mg total) by mouth every 6 (six) hours as needed (Nausea or vomiting). 30 tablet 1  . tetrahydrozoline-zinc (VISINE-AC) 0.05-0.25 % ophthalmic solution Place 1 drop into both eyes 2 (two) times daily as needed (dry eyes).    . Triamcinolone Acetonide (NASACORT ALLERGY 24HR NA) Place 1 spray into the nose daily as needed (allergies).    . triamterene-hydrochlorothiazide (MAXZIDE) 75-50 MG tablet Take 1 tablet by mouth daily.    Loura Pardon Salicylate (ASPERCREME EX) Apply 1 application topically daily as needed (joint pain).    . vitamin B-12 (CYANOCOBALAMIN) 500 MCG tablet Take 500 mcg by mouth daily.     No current facility-administered medications for this encounter.    Facility-Administered Medications Ordered in Other Encounters  Medication Dose Route Frequency Provider Last Rate Last Dose  . heparin lock flush 100 unit/mL  500 Units Intracatheter Once PRN Cira Rue K, NP      . sodium chloride  flush (NS) 0.9 % injection 10 mL  10 mL Intracatheter Once PRN Alla Feeling, NP         REVIEW OF SYSTEMS: On review of systems, the patient reports that she is doing well overall. She did state that she had a hard time with fatigue and side effects of chemotherapy though these seem to be resolving. She denies any rectal bleeding or diarrhea at this time. She did use miralax once last week due to some constipation. She denies any chest pain, shortness of breath, cough, fevers, chills, night sweats, unintended weight changes. She denies any  bladder disturbances, and denies abdominal pain, nausea or vomiting. She denies any new musculoskeletal or joint aches or pains, new skin lesions or concerns. A complete review of systems is obtained and is otherwise negative.     PHYSICAL EXAM:  Wt Readings from Last 3 Encounters:  03/21/18 158 lb (71.7 kg)  03/21/18 157 lb 11.2 oz (71.5 kg)  03/07/18 157 lb 9.6 oz (71.5 kg)   Temp Readings from Last 3 Encounters:  03/21/18 98 F (36.7 C) (Oral)  03/21/18 98.1 F (36.7 C) (Oral)  03/07/18 97.7 F (36.5 C) (Oral)   BP Readings from Last 3 Encounters:  03/21/18 (!) 177/76  03/21/18 (!) 142/76  03/09/18 (!) 156/66   Pulse Readings from Last 3 Encounters:  03/21/18 89  03/21/18 86  03/09/18 86   Pain Assessment Pain Score: 0-No pain  In general this is a well appearing African American female in no acute distress. She is alert and oriented x4 and appropriate throughout the examination. HEENT reveals that the patient is normocephalic, atraumatic. EOMs are intact. Cardiopulmonary assessment is negative for acute distress and she exhibits normal effort.    ECOG = 0  0 - Asymptomatic (Fully active, able to carry on all predisease activities without restriction)  1 - Symptomatic but completely ambulatory (Restricted in physically strenuous activity but ambulatory and able to carry out work of a light or sedentary nature. For example, light  housework, office work)  2 - Symptomatic, <50% in bed during the day (Ambulatory and capable of all self care but unable to carry out any work activities. Up and about more  than 50% of waking hours)  3 - Symptomatic, >50% in bed, but not bedbound (Capable of only limited self-care, confined to bed or chair 50% or more of waking hours)  4 - Bedbound (Completely disabled. Cannot carry on any self-care. Totally confined to bed or chair)  5 - Death   Eustace Pen MM, Creech RH, Tormey DC, et al. [phone]). "Toxicity and response criteria of the [facility]". [place] Oncol. 5 (6): 649-55    LABORATORY DATA:  Lab Results  Component Value Date   WBC 9.6 03/21/2018   HGB 9.2 (L) 03/21/2018   HCT 28.5 (L) 03/21/2018   MCV 99.7 03/21/2018   PLT 185 03/21/2018   Lab Results  Component Value Date   NA 141 03/21/2018   K 3.9 03/21/2018   CL 105 03/21/2018   CO2 26 03/21/2018   Lab Results  Component Value Date   ALT 18 03/21/2018   AST 38 03/21/2018   ALKPHOS 181 (H) 03/21/2018   BILITOT 0.3 03/21/2018      RADIOGRAPHY: No results found.     IMPRESSION/PLAN: 1. Stage IIIB, cT3N1M0 adenocarcinoma of the rectum s/p neoadjuvant chemotherapy. Dr. Lisbeth Renshaw discusses the patient's recent course and she appears to be recovering from the effects of systemic therapy. We discussed the rationale to proceed with radiotherapy, and the patient is interested in proceeding. Dr. Lisbeth Renshaw discusses the delivery and logistics of radiotherapy and anticipates a course of 5 1/2 weeks of radiotherapy. She would like to wait a few more weeks before beginning and will return for simulation on 04/02/2018 at 11 am. Written consent is obtained and placed in the chart, a copy was provided to the patient. We anticipate her treatment to begin on 04/09/2018.    In a visit lasting 25 minutes, greater than 50% of the time was spent face to face discussing her case, and coordinating the patient's care.  The  above documentation reflects my direct findings during this shared patient visit. Please see the separate note by Dr. Lisbeth Renshaw on this date for the remainder of the patient's plan of care.    Carola Rhine, PAC

## 2018-03-21 NOTE — Telephone Encounter (Signed)
Oral Chemotherapy Pharmacist Encounter   I spoke with patient for face to face for overview of: Xeloda (capecitabine) for the continued neoadjuvant treatment of stage IIIB rectal cancer in conjunction with radiation, planned duration 5 1/2 weeks (28 treatment days).   Counseled patient on administration, dosing, side effects, monitoring, drug-food interactions, safe handling, storage, and disposal.  Patient will take Xeloda 500mg  tablets, 3 tablets (1500mg ) by mouth in AM and 3 tabs (1500mg ) by mouth in PM, within 30 minutes of finishing meals, on days of radiation only.  Patient states her appetite remains very depressed, but she will ensure she puts some food down prior to each Xeloda administration.  Xeloda and radiation start date: radiation schedule not yet set, anticipate 03/26/18  Adverse effects of Xeloda include but are not limited to: fatigue, decreased blood counts, GI upset, diarrhea, mouth sores, and hand-foot syndrome.  Patient states she experienced some mouth sores during FOLFOX treat,ent and still has some magic mouthwash at home. She will use it if mouth sores develop.  Patient also states her hands have darkened since starting treatment, patient informed this will continue with Xeloda administration and then will slowly dissipate after treatment is completed.  Patient has anti-emetic on hand and knows to take it if nausea develops.   Patient will obtain anti diarrheal and alert the office of 4 or more loose stools above baseline.   Reviewed with patient importance of keeping a medication schedule and plan for any missed doses.  Nicole Sellers voiced understanding and appreciation.   All questions answered.  Medication reconciliation performed and medication/allergy list updated.  Insurance authorization for Xeloda course is not required. Test claim at the [facility] reveals copayment ~$75 for 1st 15 day supply. This will be covered by the [facility]  that has been previously secured for patient to reduce her out of pocket cost at the pharmacy for Xeloda to $0  The 1st 90 tablets of Xeloda will ship from the [facility] today for delivery to patient's home tomorrow. Patient informed that the pharmacy will contact her ~1 week prior to needing next fill of Xeloda to coordinate continued medication acquisition and ensure she does not experience a break in therapy.  Patient knows to call the office with questions or concerns. Oral Oncology Clinic will continue to follow.  Johny Drilling, PharmD, BCPS, BCOP  03/21/2018  9:47 AM Oral Oncology Clinic [phone]

## 2018-03-22 ENCOUNTER — Ambulatory Visit: Payer: Medicare Other | Admitting: Radiation Oncology

## 2018-03-23 ENCOUNTER — Telehealth: Payer: Self-pay

## 2018-03-23 NOTE — Telephone Encounter (Signed)
Oral Oncology Patient Advocate Encounter  Confirmed with [facility] that Xeloda was shipped on 03/21/18 with a $0 copay using her [place].   [place] Patient [place] Phone [phone] Fax [phone] 03/23/2018   9:27 AM

## 2018-03-23 NOTE — Telephone Encounter (Signed)
Patient calls inquiring about whether it is okay to take an antibiotic that her podiatrist gave her for an irritated toe.  Spoke with Dr. Burr Medico, called patient back and informed her per Dr. Burr Medico it is okay to take whatever antibiotic they want to prescribe.  Patient verbalized an understanding.

## 2018-03-27 ENCOUNTER — Ambulatory Visit: Payer: Medicare Other | Admitting: Radiation Oncology

## 2018-04-02 ENCOUNTER — Inpatient Hospital Stay: Payer: Medicare Other

## 2018-04-02 ENCOUNTER — Ambulatory Visit: Payer: Medicare Other

## 2018-04-02 ENCOUNTER — Other Ambulatory Visit: Payer: Self-pay

## 2018-04-02 ENCOUNTER — Ambulatory Visit
Admission: RE | Admit: 2018-04-02 | Discharge: 2018-04-02 | Disposition: A | Discharge status: Home / Self Care | Payer: Medicare Other | Source: Ambulatory Visit | Attending: Radiation Oncology | Admitting: Radiation Oncology

## 2018-04-02 ENCOUNTER — Inpatient Hospital Stay (HOSPITAL_BASED_OUTPATIENT_CLINIC_OR_DEPARTMENT_OTHER): Payer: Medicare Other | Admitting: Medical

## 2018-04-02 VITALS — BP 167/71 | HR 82 | Temp 97.6°F | Resp 18 | Ht 66.0 in | Wt 155.5 lb

## 2018-04-02 DIAGNOSIS — I10 Essential (primary) hypertension: Secondary | ICD-10-CM

## 2018-04-02 DIAGNOSIS — C20 Malignant neoplasm of rectum: Secondary | ICD-10-CM

## 2018-04-02 DIAGNOSIS — R918 Other nonspecific abnormal finding of lung field: Secondary | ICD-10-CM | POA: Diagnosis not present

## 2018-04-02 DIAGNOSIS — E039 Hypothyroidism, unspecified: Secondary | ICD-10-CM

## 2018-04-02 DIAGNOSIS — Z87891 Personal history of nicotine dependence: Secondary | ICD-10-CM

## 2018-04-02 DIAGNOSIS — Z7982 Long term (current) use of aspirin: Secondary | ICD-10-CM

## 2018-04-02 DIAGNOSIS — K219 Gastro-esophageal reflux disease without esophagitis: Secondary | ICD-10-CM

## 2018-04-02 DIAGNOSIS — E1151 Type 2 diabetes mellitus with diabetic peripheral angiopathy without gangrene: Secondary | ICD-10-CM

## 2018-04-02 DIAGNOSIS — F329 Major depressive disorder, single episode, unspecified: Secondary | ICD-10-CM | POA: Diagnosis not present

## 2018-04-02 DIAGNOSIS — Z79899 Other long term (current) drug therapy: Secondary | ICD-10-CM

## 2018-04-02 DIAGNOSIS — D509 Iron deficiency anemia, unspecified: Secondary | ICD-10-CM | POA: Diagnosis not present

## 2018-04-02 DIAGNOSIS — E1140 Type 2 diabetes mellitus with diabetic neuropathy, unspecified: Secondary | ICD-10-CM

## 2018-04-02 DIAGNOSIS — Z510 Encounter for antineoplastic radiation therapy: Secondary | ICD-10-CM | POA: Insufficient documentation

## 2018-04-02 DIAGNOSIS — E7800 Pure hypercholesterolemia, unspecified: Secondary | ICD-10-CM

## 2018-04-02 DIAGNOSIS — Z7984 Long term (current) use of oral hypoglycemic drugs: Secondary | ICD-10-CM

## 2018-04-02 DIAGNOSIS — K5730 Diverticulosis of large intestine without perforation or abscess without bleeding: Secondary | ICD-10-CM

## 2018-04-02 LAB — CBC WITH DIFFERENTIAL (CANCER CENTER ONLY)
Abs Immature Granulocytes: 0.01 10*3/uL (ref 0.00–0.07)
Basophils Absolute: 0.1 10*3/uL (ref 0.0–0.1)
Basophils Relative: 2 %
Eosinophils Absolute: 0.2 10*3/uL (ref 0.0–0.5)
Eosinophils Relative: 5 %
HCT: 30.5 % — ABNORMAL LOW (ref 36.0–46.0)
Hemoglobin: 10 g/dL — ABNORMAL LOW (ref 12.0–15.0)
Immature Granulocytes: 0 %
Lymphocytes Relative: 33 %
Lymphs Abs: 1.5 10*3/uL (ref 0.7–4.0)
MCH: 32.7 pg (ref 26.0–34.0)
MCHC: 32.8 g/dL (ref 30.0–36.0)
MCV: 99.7 fL (ref 80.0–100.0)
Monocytes Absolute: 0.5 10*3/uL (ref 0.1–1.0)
Monocytes Relative: 11 %
Neutro Abs: 2.3 10*3/uL (ref 1.7–7.7)
Neutrophils Relative %: 49 %
Platelet Count: 267 10*3/uL (ref 150–400)
RBC: 3.06 MIL/uL — ABNORMAL LOW (ref 3.87–5.11)
RDW: 14.1 % (ref 11.5–15.5)
WBC Count: 4.7 10*3/uL (ref 4.0–10.5)
nRBC: 0 % (ref 0.0–0.2)

## 2018-04-02 LAB — CMP (CANCER CENTER ONLY)
ALT: 15 U/L (ref 0–44)
AST: 27 U/L (ref 15–41)
Albumin: 3.4 g/dL — ABNORMAL LOW (ref 3.5–5.0)
Alkaline Phosphatase: 112 U/L (ref 38–126)
Anion gap: 10 (ref 5–15)
BUN: 12 mg/dL (ref 8–23)
CO2: 24 mmol/L (ref 22–32)
Calcium: 9.3 mg/dL (ref 8.9–10.3)
Chloride: 105 mmol/L (ref 98–111)
Creatinine: 0.86 mg/dL (ref 0.44–1.00)
GFR, Est AFR Am: >60 mL/min (ref >60)
GFR, Estimated: >60 mL/min (ref >60)
Glucose, Bld: 109 mg/dL — ABNORMAL HIGH (ref 70–99)
Potassium: 4 mmol/L (ref 3.5–5.1)
Sodium: 139 mmol/L (ref 135–145)
Total Bilirubin: 0.3 mg/dL (ref 0.3–1.2)
Total Protein: 6.9 g/dL (ref 6.5–8.1)

## 2018-04-02 MED ORDER — HEPARIN SOD (PORK) LOCK FLUSH 100 UNIT/ML IV SOLN
500.0000 [IU] | Freq: Once | INTRAVENOUS | Status: AC | PRN
  Administered 2018-04-02: 500 [IU]
  Filled 2018-04-02: qty 5

## 2018-04-02 MED ORDER — SODIUM CHLORIDE 0.9% FLUSH
10.0000 mL | Freq: Once | INTRAVENOUS | Status: AC | PRN
  Administered 2018-04-02: 10 mL
  Filled 2018-04-02: qty 10

## 2018-04-02 NOTE — Patient Instructions (Signed)

## 2018-04-02 NOTE — Progress Notes (Signed)
[facility] Progress Note   Nicole Sellers [id number] October 28, 1950 68 y.o.  Nicole Sellers is managed by Dr. Truitt Merle  Actively treated with chemotherapy/immunotherapy/hormonal therapy: yes  Current therapy: FOLFOX with the Udenyca support  Last treated: 03/07/2018 (cycle 8) pending start of radiation and capecitabine on 04/09/2018  Next scheduled appointment with provider: 04/02/2018  Assessment: Plan:    Rectal cancer ([place]) - Plan: heparin lock flush 100 unit/mL, sodium chloride flush (NS) 0.9 % injection 10 mL   Rectal adenocarcinoma: The patient is status post cycle 8 of FOLFOX with the Udenyca support.  She is pending the start of radiation and capecitabine on 04/09/2018 and will see Dr. Burr Medico in follow-up on 04/02/2018.  Please see After Visit Summary for patient specific instructions.  Future Appointments  Date Time Provider [place]  04/09/2018  8:30 AM [facility] LAB 4 [facility] None  04/09/2018  8:45 AM [facility] [place] FLUSH [facility] None  04/09/2018 10:00 AM Kyung Rudd, MD [facility] None  04/10/2018  8:45 AM [facility] LINAC 1 [facility] None  04/11/2018  8:45 AM [facility] LINAC 1 [facility] None  04/12/2018  8:45 AM [facility] LINAC 1 [facility] None  04/13/2018  8:45 AM [facility] LINAC 1 [facility] None  04/16/2018  1:30 PM [facility] LINAC 1 [facility] None  04/16/2018  2:00 PM [facility] LAB 1 [facility] None  04/16/2018  2:15 PM [facility] [place] FLUSH [facility] None  04/16/2018  2:45 PM Truitt Merle, MD [facility] None  04/17/2018  8:45 AM [facility] LINAC 1 [facility] None  04/18/2018  8:45 AM [facility] LINAC 1 [facility] None  04/19/2018  8:45 AM [facility] LINAC 1 [facility] None  04/20/2018  8:45 AM [facility] LINAC 1 [facility] None  04/23/2018  8:15 AM [facility] LAB 4 [facility] None  04/23/2018  8:45 AM [facility] [place] FLUSH [facility] None  04/23/2018  9:00 AM [facility] LINAC 1 [facility] None  04/24/2018  8:45 AM  [facility] LINAC 1 [facility] None  04/25/2018  8:45 AM [facility] LINAC 1 [facility] None  04/26/2018  8:45 AM [facility] LINAC 1 [facility] None  04/27/2018  8:45 AM [facility] LINAC 1 [facility] None  04/30/2018  1:45 PM [facility] LINAC 1 [facility] None  04/30/2018  2:00 PM [facility] LAB 3 [facility] None  04/30/2018  2:15 PM [facility] [place] FLUSH [facility] None  04/30/2018  2:45 PM Truitt Merle, MD [facility] None  05/01/2018  8:45 AM [facility] LINAC 1 [facility] None  05/02/2018  8:45 AM [facility] LINAC 1 [facility] None  05/02/2018 11:00 AM [facility] VASC 1 [facility]  05/03/2018  8:45 AM [facility] LINAC 1 [facility] None  05/04/2018  8:45 AM [facility] LINAC 1 [facility] None  05/07/2018  8:45 AM [facility] LINAC 1 [facility] None  05/08/2018  8:45 AM [facility] LINAC 1 [facility] None  05/09/2018  8:45 AM [facility] LINAC 1 [facility] None  05/10/2018  8:45 AM [facility] LINAC 1 [facility] None  05/11/2018  8:45 AM [facility] LINAC 1 [facility] None  05/14/2018  8:45 AM [facility] LINAC 1 [facility] None  05/15/2018  8:45 AM [facility] LINAC 1 [facility] None  05/16/2018  8:45 AM [facility] LINAC 1 [facility] None  12/03/2018  9:00 AM [facility] VASC 2 [facility]  12/03/2018 10:00 AM [facility] VASC 2 [facility]    No orders of the defined types were placed in this encounter.      Subjective:   Patient ID:  Nicole Sellers is a 68 y.o. (DOB 06-25-1950) female.  Chief Complaint:  Chief Complaint  Patient presents with  .  Follow-up    HPI [name]   is a 68 year old female with a history of a rectal adenocarcinoma who is managed by Dr. Burr Medico.  She is status post cycle 8 of FOLFOX with the Udenyca support.  She is pending the start of radiation therapy and capecitabine which is to begin on 04/09/2018.  She presents to clinic today for follow-up of her last cycle of treatment.  She is eager to leave the clinic.  Her labs were reviewed with  her today.  She reports that she is doing well overall.  She denies fevers, chills, sweats, headache, nausea, vomiting, constipation, or diarrhea.   Medications: I have reviewed the patient's current medications.  Allergies:  Allergies  Allergen Reactions  . Kiwi Extract Itching    Throat itching   . Lisinopril     Bruising   . Plavix [Clopidogrel Bisulfate]     Upset stomach, bloody stool, bruising     Past Medical History:  Diagnosis Date  . Allergy   . Anemia   . Arthritis    "joints sometimes" (12/25/2012)  . Clotting disorder ([place])   . Colon cancer ([place])   . Critical lower limb ischemia   . Gangrene of toe, Rt second toe 12/25/2012  . GERD (gastroesophageal reflux disease)   . High cholesterol   . Hypertension   . Hypothyroidism   . PAD (peripheral artery disease) ([place])   . S/P arterial stent, 12/25/13, successful diamondback orbital rotational arthrectomy, PTA using chocolate  balloon and stenting using I DEV stent of long segment calcified high-grade proximal and mid r 12/25/2012  . Tobacco abuse   . Type II diabetes mellitus ([place])     Past Surgical History:  Procedure Laterality Date  . ABDOMINAL AORTAGRAM  12/20/2012   Procedure: ABDOMINAL AORTAGRAM;  Surgeon: Lorretta Harp, MD;  Location: [facility] CATH LAB;  Service: Cardiovascular;;  . ABDOMINAL HYSTERECTOMY  2000  . ANGIOPLASTY / STENTING FEMORAL Right 12/25/2012  . ATHERECTOMY Right 12/25/2012   Procedure: ATHERECTOMY;  Surgeon: Lorretta Harp, MD;  Location: [facility] CATH LAB;  Service: Cardiovascular;  Laterality: Right;  right SFA  . HERNIA REPAIR    . LOWER EXTREMITY ANGIOGRAM N/A 12/20/2012   Procedure: LOWER EXTREMITY ANGIOGRAM;  Surgeon: Lorretta Harp, MD;  Location: [facility] CATH LAB;  Service: Cardiovascular;  Laterality: N/A;  . LOWER EXTREMITY ANGIOGRAM N/A 12/25/2012   Procedure: LOWER EXTREMITY ANGIOGRAM;  Surgeon: Lorretta Harp, MD;  Location: [facility] CATH LAB;  Service: Cardiovascular;  Laterality:  N/A;  . LOWER EXTREMITY INTERVENTION  05/08/2017  . LOWER EXTREMITY INTERVENTION Bilateral 05/08/2017   Procedure: LOWER EXTREMITY INTERVENTION;  Surgeon: Lorretta Harp, MD;  Location: [place] CV LAB;  Service: Cardiovascular;  Laterality: Bilateral;  . PERIPHERAL VASCULAR BALLOON ANGIOPLASTY Right 05/08/2017   Procedure: PERIPHERAL VASCULAR BALLOON ANGIOPLASTY;  Surgeon: Lorretta Harp, MD;  Location: [place] CV LAB;  Service: Cardiovascular;  Laterality: Right;  sfa  . PERIPHERAL VASCULAR INTERVENTION Right 05/08/2017   Procedure: PERIPHERAL VASCULAR INTERVENTION;  Surgeon: Lorretta Harp, MD;  Location: [place] CV LAB;  Service: Cardiovascular;  Laterality: Right;  ext iliac  . PORTACATH PLACEMENT Right 11/22/2017   Procedure: INSERTION PORT-A-CATH;  Surgeon: Leighton Ruff, MD;  Location: [facility] ORS;  Service: General;  Laterality: Right;  . TOE SURGERY Right    2n toe   . UMBILICAL HERNIA REPAIR  2000    Family History  Problem Relation Age of Onset  . Hypertension Mother   .  Stroke Mother   . Cancer Father        prostate  . Mental illness Sister   . Diabetes Brother   . Hypertension Brother   . Cancer Brother 24       prostate  . Diabetes Brother   . Hypertension Brother   . Cancer Brother        "tumors on neck and body"     Social History   Socioeconomic History  . Marital status: Single    Spouse name: Not on file  . Number of children: 1  . Years of education: Not on file  . Highest education level: Not on file  Occupational History  . Occupation: CNA    Comment: Retired   Scientific laboratory technician  . Financial resource strain: Not on file  . Food insecurity:    Worry: Not on file    Inability: Not on file  . Transportation needs:    Medical: Yes    Non-medical: Yes  Tobacco Use  . Smoking status: Former Smoker    Packs/day: 0.50    Years: 48.00    Pack years: 24.00    Types: Cigarettes  . Smokeless tobacco: Never Used  . Tobacco comment: quit in  2016  Substance and Sexual Activity  . Alcohol use: No    Alcohol/week: 0.0 standard drinks  . Drug use: No  . Sexual activity: Not Currently  Lifestyle  . Physical activity:    Days per week: Not on file    Minutes per session: Not on file  . Stress: Not on file  Relationships  . Social connections:    Talks on phone: Not on file    Gets together: Not on file    Attends religious service: Not on file    Active member of club or organization: Not on file    Attends meetings of clubs or organizations: Not on file    Relationship status: Not on file  . Intimate partner violence:    Fear of current or ex partner: No    Emotionally abused: No    Physically abused: No    Forced sexual activity: No  Other Topics Concern  . Not on file  Social History Narrative  . Not on file    Past Medical History, Surgical history, Social history, and Family history were reviewed and updated as appropriate.   Please see review of systems for further details on the patient's review from today.   Review of Systems:  Review of Systems  Constitutional: Negative for chills, diaphoresis and fever.  HENT: Negative for trouble swallowing and voice change.   Respiratory: Negative for cough, chest tightness, shortness of breath and wheezing.   Cardiovascular: Negative for chest pain and palpitations.  Gastrointestinal: Negative for abdominal pain, constipation, diarrhea, nausea and vomiting.  Musculoskeletal: Negative for back pain and myalgias.  Neurological: Negative for dizziness, light-headedness and headaches.    Objective:   Physical Exam:  BP (!) 167/71 (BP Location: Right Arm, Patient Position: Sitting) Comment: Pt did not want to retake BP/ Liza RN was notified  Pulse 82   Temp 97.6 F (36.4 C) (Oral)   Resp 18   Ht 5\' 6"  (1.676 m)   Wt 155 lb 8 oz (70.5 kg)   SpO2 100%   BMI 25.10 kg/m  ECOG: 0  Physical Exam Constitutional:      General: She is not in acute distress.     Appearance: Normal appearance. She is not ill-appearing.  HENT:     Head: Normocephalic and atraumatic.  Neurological:     Mental Status: She is alert.     Coordination: Coordination normal.     Gait: Gait normal.  Psychiatric:        Mood and Affect: Mood normal.        Behavior: Behavior normal.        Thought Content: Thought content normal.        Judgment: Judgment normal.     Lab Review:     Component Value Date/Time   NA 139 04/02/2018 0928   NA 136 08/10/2017 1125   K 4.0 04/02/2018 0928   CL 105 04/02/2018 0928   CO2 24 04/02/2018 0928   GLUCOSE 109 (H) 04/02/2018 0928   BUN 12 04/02/2018 0928   BUN 13 08/10/2017 1125   CREATININE 0.86 04/02/2018 0928   CREATININE 0.80 11/27/2014 1043   CALCIUM 9.3 04/02/2018 0928   PROT 6.9 04/02/2018 0928   PROT 7.1 08/10/2017 1125   ALBUMIN 3.4 (L) 04/02/2018 0928   ALBUMIN 4.2 08/10/2017 1125   AST 27 04/02/2018 0928   ALT 15 04/02/2018 0928   ALKPHOS 112 04/02/2018 0928   BILITOT 0.3 04/02/2018 0928   GFRNONAA >60 04/02/2018 0928   GFRAA >60 04/02/2018 0928       Component Value Date/Time   WBC 4.7 04/02/2018 0928   WBC 6.2 11/22/2017 0927   RBC 3.06 (L) 04/02/2018 0928   HGB 10.0 (L) 04/02/2018 0928   HGB 8.7 (L) 08/10/2017 1125   HCT 30.5 (L) 04/02/2018 0928   HCT 26.6 (L) 08/10/2017 1125   PLT 267 04/02/2018 0928   PLT 537 (H) 08/10/2017 1125   MCV 99.7 04/02/2018 0928   MCV 78 (L) 08/10/2017 1125   MCH 32.7 04/02/2018 0928   MCHC 32.8 04/02/2018 0928   RDW 14.1 04/02/2018 0928   RDW 15.2 08/10/2017 1125   LYMPHSABS 1.5 04/02/2018 0928   LYMPHSABS 2.2 04/27/2017 1548   MONOABS 0.5 04/02/2018 0928   EOSABS 0.2 04/02/2018 0928   EOSABS 0.2 04/27/2017 1548   BASOSABS 0.1 04/02/2018 0928   BASOSABS 0.0 04/27/2017 1548   -------------------------------  Imaging from last 24 hours (if applicable):  Radiology interpretation: No results found.

## 2018-04-03 ENCOUNTER — Ambulatory Visit (HOSPITAL_COMMUNITY): Payer: Medicare Other

## 2018-04-03 ENCOUNTER — Ambulatory Visit: Payer: Medicare Other

## 2018-04-04 ENCOUNTER — Ambulatory Visit: Payer: Medicare Other

## 2018-04-04 NOTE — Progress Notes (Signed)
  Radiation Oncology         [phone] ________________________________  Name: Nicole Sellers MRN: [id number]  Date: 04/02/2018  DOB: 1950-06-27   SIMULATION AND TREATMENT PLANNING NOTE  DIAGNOSIS:     ICD-10-CM   1. Rectal cancer ([place]) C20      The patient presented for simulation for the patient's upcoming course of radiation for the diagnosis of rectal cancer. The patient was placed in a supine position. A customized vac-lock bag was constructed to aid in patient immobilization on. This complex treatment device will be used on a daily basis during the treatment. In this fashion a CT scan was obtained through the pelvic region and the isocenter was placed near midline within the pelvis. Surface markings were placed.  The patient's imaging was loaded into the radiation treatment planning system. The patient will initially be planned to receive a course of radiation to a dose of 45 Gy. This will be accomplished in 25 fractions at 1.8 gray per fraction. This initial treatment will correspond to a 3-D conformal technique. The target has been contoured in addition to the rectum, bladder and femoral heads. Dose volume histograms of each of these structures have been requested and these will be carefully reviewed as part of the 3-D conformal treatment planning process. To accomplish this initial treatment, 4 customized blocks have been designed for this purpose. Each of these 4 complex treatment devices will be used on a daily basis during the initial course of the treatment. It is anticipated that the patient will then receive a boost for an additional 5.4 Gy. The anticipated total dose therefore will be 50.4 Gy.    Special treatment procedure The patient will receive chemotherapy during the course of radiation treatment. The patient may experience increased or overlapping toxicity due to this combined-modality approach and the patient will be monitored for such problems. This may include extra  lab work as necessary. This therefore constitutes a special treatment procedure.    ________________________________  [name], MD, PhD

## 2018-04-04 NOTE — Progress Notes (Signed)
  Radiation Oncology         [phone]) [phone] ________________________________  Name: Nicole Sellers MRN: [id number]  Date: 04/02/2018  DOB: May 03, 1950  Optical Surface Tracking Plan:  Since intensity modulated radiotherapy (IMRT) and 3D conformal radiation treatment methods are predicated on accurate and precise positioning for treatment, intrafraction motion monitoring is medically necessary to ensure accurate and safe treatment delivery.  The ability to quantify intrafraction motion without excessive ionizing radiation dose can only be performed with optical surface tracking. Accordingly, surface imaging offers the opportunity to obtain 3D measurements of patient position throughout IMRT and 3D treatments without excessive radiation exposure.  I am ordering optical surface tracking for this patient's upcoming course of radiotherapy. ________________________________  Kyung Rudd, MD 04/04/2018 3:18 PM    Reference:   Particia Jasper, et al. Surface imaging-based analysis of intrafraction motion for breast radiotherapy patients.Journal of [place], n. 6, nov. 2014. ISSN [id number].   Available at: <http://www.jacmp.org/index.php/jacmp/article/view/4957>.

## 2018-04-05 ENCOUNTER — Ambulatory Visit: Payer: Medicare Other

## 2018-04-05 DIAGNOSIS — Z510 Encounter for antineoplastic radiation therapy: Secondary | ICD-10-CM | POA: Diagnosis not present

## 2018-04-06 ENCOUNTER — Ambulatory Visit: Payer: Medicare Other

## 2018-04-09 ENCOUNTER — Inpatient Hospital Stay: Payer: Medicare Other

## 2018-04-09 ENCOUNTER — Other Ambulatory Visit: Payer: Self-pay

## 2018-04-09 ENCOUNTER — Ambulatory Visit
Admission: RE | Admit: 2018-04-09 | Discharge: 2018-04-09 | Disposition: A | Discharge status: Home / Self Care | Payer: Medicare Other | Source: Ambulatory Visit | Attending: Radiation Oncology | Admitting: Radiation Oncology

## 2018-04-09 DIAGNOSIS — Z510 Encounter for antineoplastic radiation therapy: Secondary | ICD-10-CM | POA: Diagnosis not present

## 2018-04-09 DIAGNOSIS — C20 Malignant neoplasm of rectum: Secondary | ICD-10-CM | POA: Diagnosis not present

## 2018-04-09 LAB — CMP (CANCER CENTER ONLY)
ALT: 14 U/L (ref 0–44)
AST: 24 U/L (ref 15–41)
Albumin: 3.4 g/dL — ABNORMAL LOW (ref 3.5–5.0)
Alkaline Phosphatase: 105 U/L (ref 38–126)
Anion gap: 8 (ref 5–15)
BUN: 10 mg/dL (ref 8–23)
CO2: 26 mmol/L (ref 22–32)
Calcium: 9.2 mg/dL (ref 8.9–10.3)
Chloride: 105 mmol/L (ref 98–111)
Creatinine: 0.84 mg/dL (ref 0.44–1.00)
GFR, Est AFR Am: >60 mL/min (ref >60)
GFR, Estimated: >60 mL/min (ref >60)
Glucose, Bld: 117 mg/dL — ABNORMAL HIGH (ref 70–99)
Potassium: 3.9 mmol/L (ref 3.5–5.1)
Sodium: 139 mmol/L (ref 135–145)
Total Bilirubin: 0.2 mg/dL — ABNORMAL LOW (ref 0.3–1.2)
Total Protein: 6.7 g/dL (ref 6.5–8.1)

## 2018-04-09 LAB — CBC WITH DIFFERENTIAL (CANCER CENTER ONLY)
Abs Immature Granulocytes: 0 10*3/uL (ref 0.00–0.07)
Basophils Absolute: 0.1 10*3/uL (ref 0.0–0.1)
Basophils Relative: 2 %
Eosinophils Absolute: 0.2 10*3/uL (ref 0.0–0.5)
Eosinophils Relative: 6 %
HCT: 31.5 % — ABNORMAL LOW (ref 36.0–46.0)
Hemoglobin: 10.1 g/dL — ABNORMAL LOW (ref 12.0–15.0)
Immature Granulocytes: 0 %
Lymphocytes Relative: 33 %
Lymphs Abs: 1.3 10*3/uL (ref 0.7–4.0)
MCH: 32.5 pg (ref 26.0–34.0)
MCHC: 32.1 g/dL (ref 30.0–36.0)
MCV: 101.3 fL — ABNORMAL HIGH (ref 80.0–100.0)
Monocytes Absolute: 0.4 10*3/uL (ref 0.1–1.0)
Monocytes Relative: 9 %
Neutro Abs: 1.9 10*3/uL (ref 1.7–7.7)
Neutrophils Relative %: 50 %
Platelet Count: 240 10*3/uL (ref 150–400)
RBC: 3.11 MIL/uL — ABNORMAL LOW (ref 3.87–5.11)
RDW: 13.5 % (ref 11.5–15.5)
WBC Count: 3.8 10*3/uL — ABNORMAL LOW (ref 4.0–10.5)
nRBC: 0 % (ref 0.0–0.2)

## 2018-04-10 ENCOUNTER — Other Ambulatory Visit: Payer: Self-pay

## 2018-04-10 ENCOUNTER — Telehealth: Payer: Self-pay | Admitting: Pharmacist

## 2018-04-10 ENCOUNTER — Ambulatory Visit
Admission: RE | Admit: 2018-04-10 | Discharge: 2018-04-10 | Disposition: A | Discharge status: Home / Self Care | Payer: Medicare Other | Source: Ambulatory Visit | Attending: Radiation Oncology | Admitting: Radiation Oncology

## 2018-04-10 DIAGNOSIS — Z510 Encounter for antineoplastic radiation therapy: Secondary | ICD-10-CM | POA: Diagnosis not present

## 2018-04-10 NOTE — Telephone Encounter (Signed)
Oral Oncology Pharmacist Encounter  Spoke with patient and lobby for clarification of Xeloda dosing. Patient is taking Xeloda for the continued neoadjuvant treatment of stage IIIb rectal cancer in conjunction with radiation, planned duration 28 treatment days over 38 calendar days. Radiation is scheduled for 04/09/2018- 05/16/2018  Patient states her Xeloda bottle from the pharmacy has directions to take 2 tablets (1000 mg) by mouth 2 times daily on days of radiation only. Patient's prescribed Xeloda dose should be 3 tablets (1500 mg) by mouth 2 times daily on days of radiation. Patient did not have her Xeloda bottle with her at the time.  I called patient when she got home to verify directions on prescription bottle. Prescription in the electronic medical record and at the pharmacy both state to take 3 tablets twice daily. Verified with patient that her bottle also says to take 3 tablets twice daily.  We reviewed Xeloda dosing and schedule. All questions answered. Patient knows to call the office with any additional questions or concerns.  Johny Drilling, PharmD, BCPS, BCOP  04/10/2018 11:05 AM Oral Oncology Clinic [phone]

## 2018-04-11 ENCOUNTER — Ambulatory Visit
Admission: RE | Admit: 2018-04-11 | Discharge: 2018-04-11 | Disposition: A | Discharge status: Home / Self Care | Payer: Medicare Other | Source: Ambulatory Visit | Attending: Radiation Oncology | Admitting: Radiation Oncology

## 2018-04-11 ENCOUNTER — Other Ambulatory Visit: Payer: Self-pay | Admitting: Radiation Oncology

## 2018-04-11 ENCOUNTER — Other Ambulatory Visit: Payer: Self-pay

## 2018-04-11 DIAGNOSIS — Z510 Encounter for antineoplastic radiation therapy: Secondary | ICD-10-CM | POA: Insufficient documentation

## 2018-04-11 DIAGNOSIS — C20 Malignant neoplasm of rectum: Secondary | ICD-10-CM

## 2018-04-11 NOTE — Progress Notes (Signed)
Pt here for patient teaching.  Pt given Radiation and You booklet and skin care instructions.  Reviewed areas of pertinence such as diarrhea, fatigue, hair loss, skin changes and urinary and bladder changes . Pt able to give teach back of to pat skin, use unscented/gentle soap, use baby wipes, have Imodium on hand and drink plenty of water,. Pt demonstrated understanding of information given and will contact nursing with any questions or concerns.     Http://rtanswers.org/treatmentinformation/whattoexpect/index

## 2018-04-12 ENCOUNTER — Telehealth: Payer: Self-pay | Admitting: Nutrition

## 2018-04-12 ENCOUNTER — Other Ambulatory Visit: Payer: Self-pay

## 2018-04-12 ENCOUNTER — Encounter: Payer: Self-pay | Admitting: General Practice

## 2018-04-12 ENCOUNTER — Ambulatory Visit
Admission: RE | Admit: 2018-04-12 | Discharge: 2018-04-12 | Disposition: A | Discharge status: Home / Self Care | Payer: Medicare Other | Source: Ambulatory Visit | Attending: Radiation Oncology | Admitting: Radiation Oncology

## 2018-04-12 ENCOUNTER — Inpatient Hospital Stay: Payer: Medicare Other | Attending: Nurse Practitioner | Admitting: Nutrition

## 2018-04-12 DIAGNOSIS — Z7982 Long term (current) use of aspirin: Secondary | ICD-10-CM | POA: Insufficient documentation

## 2018-04-12 DIAGNOSIS — Z923 Personal history of irradiation: Secondary | ICD-10-CM | POA: Insufficient documentation

## 2018-04-12 DIAGNOSIS — E1151 Type 2 diabetes mellitus with diabetic peripheral angiopathy without gangrene: Secondary | ICD-10-CM | POA: Insufficient documentation

## 2018-04-12 DIAGNOSIS — Z510 Encounter for antineoplastic radiation therapy: Secondary | ICD-10-CM | POA: Diagnosis not present

## 2018-04-12 DIAGNOSIS — C20 Malignant neoplasm of rectum: Secondary | ICD-10-CM | POA: Insufficient documentation

## 2018-04-12 DIAGNOSIS — K219 Gastro-esophageal reflux disease without esophagitis: Secondary | ICD-10-CM | POA: Insufficient documentation

## 2018-04-12 DIAGNOSIS — E7800 Pure hypercholesterolemia, unspecified: Secondary | ICD-10-CM | POA: Insufficient documentation

## 2018-04-12 DIAGNOSIS — R197 Diarrhea, unspecified: Secondary | ICD-10-CM | POA: Insufficient documentation

## 2018-04-12 DIAGNOSIS — D509 Iron deficiency anemia, unspecified: Secondary | ICD-10-CM | POA: Insufficient documentation

## 2018-04-12 DIAGNOSIS — K5730 Diverticulosis of large intestine without perforation or abscess without bleeding: Secondary | ICD-10-CM | POA: Insufficient documentation

## 2018-04-12 DIAGNOSIS — Z9221 Personal history of antineoplastic chemotherapy: Secondary | ICD-10-CM | POA: Insufficient documentation

## 2018-04-12 DIAGNOSIS — F329 Major depressive disorder, single episode, unspecified: Secondary | ICD-10-CM | POA: Insufficient documentation

## 2018-04-12 DIAGNOSIS — E039 Hypothyroidism, unspecified: Secondary | ICD-10-CM | POA: Insufficient documentation

## 2018-04-12 DIAGNOSIS — I10 Essential (primary) hypertension: Secondary | ICD-10-CM | POA: Insufficient documentation

## 2018-04-12 DIAGNOSIS — M1990 Unspecified osteoarthritis, unspecified site: Secondary | ICD-10-CM | POA: Insufficient documentation

## 2018-04-12 DIAGNOSIS — F17210 Nicotine dependence, cigarettes, uncomplicated: Secondary | ICD-10-CM | POA: Insufficient documentation

## 2018-04-12 DIAGNOSIS — Z7984 Long term (current) use of oral hypoglycemic drugs: Secondary | ICD-10-CM | POA: Insufficient documentation

## 2018-04-12 DIAGNOSIS — K5900 Constipation, unspecified: Secondary | ICD-10-CM | POA: Insufficient documentation

## 2018-04-12 DIAGNOSIS — Z79899 Other long term (current) drug therapy: Secondary | ICD-10-CM | POA: Insufficient documentation

## 2018-04-12 NOTE — Telephone Encounter (Signed)
RD working remotely.  Rd contacted patient for nutrition follow up. There was no answer, however, I left a message for patient to return call. She has name and phone number.

## 2018-04-12 NOTE — Progress Notes (Signed)
[place] Team contacted patient to assess for food insecurity and other psychosocial needs during current COVID19 pandemic.  "Pretty much have some food, have necessary supplies."  Does not have disinfectant supplies.  Son helps her w errands.  Leaves home only for appointments.   Patient/family expressed no needs at this time.  Support Team member encouraged patient to call if changes occur or they have any other questions/concerns.   Beverely Pace, [place]

## 2018-04-13 ENCOUNTER — Ambulatory Visit
Admission: RE | Admit: 2018-04-13 | Discharge: 2018-04-13 | Disposition: A | Discharge status: Home / Self Care | Payer: Medicare Other | Source: Ambulatory Visit | Attending: Radiation Oncology | Admitting: Radiation Oncology

## 2018-04-13 ENCOUNTER — Telehealth: Payer: Self-pay

## 2018-04-13 ENCOUNTER — Other Ambulatory Visit: Payer: Self-pay

## 2018-04-13 DIAGNOSIS — Z510 Encounter for antineoplastic radiation therapy: Secondary | ICD-10-CM | POA: Diagnosis not present

## 2018-04-13 NOTE — Progress Notes (Signed)
[place]   [phone] [phone]) [phone]   Clinic Follow up Note   Patient Care Team: Reynold Bowen, MD as PCP - General (Endocrinology) Lorretta Harp, MD as PCP - Cardiology (Cardiology)  Date of Service:  04/16/2018  CHIEF COMPLAINT: F/u of Rectal cancer  SUMMARY OF ONCOLOGIC HISTORY: Oncology History   Cancer Staging Rectal cancer [facility]) Staging form: Colon and Rectum, AJCC 8th Edition - Clinical stage from 10/04/2017: Stage IIIB (cT3, cN1, cM0) - Signed by Truitt Merle, MD on 11/18/2017       Rectal cancer ([place])   10/04/2017 Procedure    Colonoscopy per Dr. Collene Mares 10/04/17 shows a large, non-obstructing horse-shoe shaped mass was found in the recto-sigmoid colon at 10 cm; the mass was partially circumferential involving one-half of the lumen circumference. The mass measured seven cm in length    10/04/2017 Initial Biopsy    Rectum, at 10-17 cm, mass biopsy -invasive adenocarcinoma, moderately differentiated  ICH stains for MLH1, MSH2, MSH6, and PMS2 are intact (normal)    10/04/2017 Cancer Staging    Staging form: Colon and Rectum, AJCC 8th Edition - Clinical stage from 10/04/2017: Stage IIIB (cT3, cN1, cM0) - Signed by Truitt Merle, MD on 11/18/2017    10/25/2017 Imaging    CT CAP IMPRESSION: 1. Irregular eccentric mass within the sigmoid colon. There are at least 3 abnormal appearing lymph node adjacent to the sigmoid colon at the level of the mass concerning for the possibility of local nodal metastatic disease. 2. Multiple pulmonary nodules as described above. These are indeterminate in etiology. Recommend attention on follow-up.    11/06/2017 Initial Diagnosis    Adenocarcinoma of colon ([place])    11/16/2017 Imaging    Pelvic MRI 11/16/17  IMPRESSION: Rectal adenocarcinoma T stage:   T3 Rectal adenocarcinoma N stage:  N1 Distance from tumor to the anal sphincter is 6.9 cm.    11/29/2017 - 02/15/2018 Chemotherapy    totalneoadjuvant chemo  FOLFOXq2 weeks x4 months, starting 11/29/17-03/07/18, followed by concurrent chemoRT. Completed 8 cycles of FOLFOX on 03/07/18    01/17/2018 Imaging    CT AP W Contrast 01/17/18 IMPRESSION: 1. Previously seen soft tissue mass involving the left lateral wall the upper rectum is no longer visualized. 2. Decreased size of left perirectal lymph nodes since prior exam. 3. No other sites of metastatic disease within the abdomen or pelvis. 4. Colonic diverticulosis, without radiographic evidence of diverticulitis. 5. Stable small adjacent paraumbilical hernias containing only fat.    04/09/2018 -  Chemotherapy    Concurrent ChemoRT with Xeloda '1500mg'$  BID on days of radiation M-F starting 04/09/18      04/09/2018 -  Radiation Therapy    concurrent chemoRT with Dr. Lisbeth Renshaw starting 04/09/18      CURRENT THERAPY:  concurrent chemoRT with Xeloda '1500mg'$  BID on days of radiation M-F starting 04/09/18  INTERVAL HISTORY:  Nicole Sellers is here for a follow up of treatment. She presents to the clinic today by herself. She notes she is doing well. She notes her radiation is going well. She is taking Xeloda '1500mg'$  M-F. She denies issues except she has been having diarrhea. She took imodium which helped. She denies blood in stool. She notes she tries to keep her hands moisturized as often as she can. She notes her appetite is lower but has been able to eat adequately and maintain her weight. She notes she snacks and drinks most of the day.   She notes her brother recently  passed from his battle with cancer. She notes her one of her left toes got infected. She is s/p antibiotics. She has soreness of that tow with no open wound or discharge. She is present in a foot boot.     REVIEW OF SYSTEMS:   Constitutional: Denies fevers, chills or abnormal weight loss Eyes: Denies blurriness of vision Ears, nose, mouth, throat, and face: Denies mucositis or sore throat Respiratory: Denies cough, dyspnea or  wheezes Cardiovascular: Denies palpitation, chest discomfort or lower extremity swelling Gastrointestinal:  Denies nausea, heartburn (+) diarrhea  Skin: Denies abnormal skin rashes MSK: (+) Left toe infection, in foot boot  Lymphatics: Denies new lymphadenopathy or easy bruising Neurological:Denies numbness, tingling or new weaknesses Behavioral/Psych: Mood is stable, no new changes  All other systems were reviewed with the patient and are negative.  MEDICAL HISTORY:  Past Medical History:  Diagnosis Date  . Allergy   . Anemia   . Arthritis    "joints sometimes" (12/25/2012)  . Clotting disorder ([place])   . Colon cancer ([place])   . Critical lower limb ischemia   . Gangrene of toe, Rt second toe 12/25/2012  . GERD (gastroesophageal reflux disease)   . High cholesterol   . Hypertension   . Hypothyroidism   . PAD (peripheral artery disease) ([place])   . S/P arterial stent, 12/25/13, successful diamondback orbital rotational arthrectomy, PTA using chocolate  balloon and stenting using I DEV stent of long segment calcified high-grade proximal and mid r 12/25/2012  . Tobacco abuse   . Type II diabetes mellitus ([place])     SURGICAL HISTORY: Past Surgical History:  Procedure Laterality Date  . ABDOMINAL AORTAGRAM  12/20/2012   Procedure: ABDOMINAL AORTAGRAM;  Surgeon: Lorretta Harp, MD;  Location: [facility] CATH LAB;  Service: Cardiovascular;;  . ABDOMINAL HYSTERECTOMY  2000  . ANGIOPLASTY / STENTING FEMORAL Right 12/25/2012  . ATHERECTOMY Right 12/25/2012   Procedure: ATHERECTOMY;  Surgeon: Lorretta Harp, MD;  Location: [facility] CATH LAB;  Service: Cardiovascular;  Laterality: Right;  right SFA  . HERNIA REPAIR    . LOWER EXTREMITY ANGIOGRAM N/A 12/20/2012   Procedure: LOWER EXTREMITY ANGIOGRAM;  Surgeon: Lorretta Harp, MD;  Location: [facility] CATH LAB;  Service: Cardiovascular;  Laterality: N/A;  . LOWER EXTREMITY ANGIOGRAM N/A 12/25/2012   Procedure: LOWER EXTREMITY ANGIOGRAM;  Surgeon: Lorretta Harp, MD;  Location: [facility] CATH LAB;  Service: Cardiovascular;  Laterality: N/A;  . LOWER EXTREMITY INTERVENTION  05/08/2017  . LOWER EXTREMITY INTERVENTION Bilateral 05/08/2017   Procedure: LOWER EXTREMITY INTERVENTION;  Surgeon: Lorretta Harp, MD;  Location: [place] CV LAB;  Service: Cardiovascular;  Laterality: Bilateral;  . PERIPHERAL VASCULAR BALLOON ANGIOPLASTY Right 05/08/2017   Procedure: PERIPHERAL VASCULAR BALLOON ANGIOPLASTY;  Surgeon: Lorretta Harp, MD;  Location: [place] CV LAB;  Service: Cardiovascular;  Laterality: Right;  sfa  . PERIPHERAL VASCULAR INTERVENTION Right 05/08/2017   Procedure: PERIPHERAL VASCULAR INTERVENTION;  Surgeon: Lorretta Harp, MD;  Location: [place] CV LAB;  Service: Cardiovascular;  Laterality: Right;  ext iliac  . PORTACATH PLACEMENT Right 11/22/2017   Procedure: INSERTION PORT-A-CATH;  Surgeon: Leighton Ruff, MD;  Location: [facility] ORS;  Service: General;  Laterality: Right;  . TOE SURGERY Right    2n toe   . UMBILICAL HERNIA REPAIR  2000    I have reviewed the social history and family history with the patient and they are unchanged from previous note.  ALLERGIES:  is allergic to kiwi extract; lisinopril; and  plavix [clopidogrel bisulfate].  MEDICATIONS:  Current Outpatient Medications  Medication Sig Dispense Refill  . acetaminophen (TYLENOL) 500 MG tablet Take 500 mg by mouth 2 (two) times daily as needed for moderate pain or headache.     . albuterol (PROVENTIL HFA;VENTOLIN HFA) 108 (90 BASE) MCG/ACT inhaler Inhale 2 puffs into the lungs every 6 (six) hours as needed for wheezing or shortness of breath.    Marland Kitchen amLODipine (NORVASC) 5 MG tablet TAKE 1 TABLET BY MOUTH EVERY DAY 90 tablet 2  . aspirin 81 MG tablet Take 81 mg by mouth daily.    Marland Kitchen atorvastatin (LIPITOR) 10 MG tablet Take 1 tablet (10 mg total) by mouth daily. 90 tablet 1  . Calcium Carbonate-Vitamin D (CALCIUM 600+D PO) Take 1 tablet by mouth daily.    . capecitabine  (XELODA) 500 MG tablet Take 3 tablets (1,500 mg total) by mouth 2 (two) times daily after a meal. Take on the days of radiation only, Monday through Friday 90 tablet 1  . cetirizine (ZYRTEC) 10 MG tablet Take 10 mg by mouth daily.    . cholecalciferol (VITAMIN D) 1000 units tablet Take 4,000 Units by mouth daily.     . clopidogrel (PLAVIX) 75 MG tablet Take 1 tablet (75 mg total) by mouth daily with breakfast. 90 tablet 3  . diphenoxylate-atropine (LOMOTIL) 2.5-0.025 MG tablet Take 1-2 tablets by mouth 4 (four) times daily as needed for diarrhea or loose stools. 40 tablet 2  . FERREX 150 150 MG capsule Take 150 mg by mouth 2 (two) times daily.  11  . gabapentin (NEURONTIN) 300 MG capsule Take 300 mg by mouth daily.     Marland Kitchen glimepiride (AMARYL) 1 MG tablet Take 1 mg by mouth 2 (two) times daily.    Marland Kitchen levothyroxine (SYNTHROID, LEVOTHROID) 50 MCG tablet Take 1 tablet (50 mcg total) by mouth daily. 90 tablet 1  . lidocaine-prilocaine (EMLA) cream Apply to affected area once 30 g 3  . magic mouthwash w/lidocaine SOLN Take 5 mLs by mouth 3 (three) times daily. Swish and Spit 240 mL 0  . metFORMIN (GLUCOPHAGE-XR) 500 MG 24 hr tablet TAKE 2 TABLETS BY MOUTH  TWICE A DAY 60 tablet 0  . mirtazapine (REMERON) 7.5 MG tablet Take 2 tablets (15 mg total) by mouth at bedtime. 60 tablet 2  . Misc Natural Products (OSTEO BI-FLEX ADV TRIPLE ST) TABS Take 1 tablet by mouth 2 (two) times daily.    . Multiple Vitamin (MULTIVITAMIN) capsule Take 1 capsule by mouth daily.    . ondansetron (ZOFRAN) 8 MG tablet Take 1 tablet (8 mg total) by mouth 2 (two) times daily as needed for refractory nausea / vomiting. Start on day 3 after chemotherapy. 30 tablet 1  . prochlorperazine (COMPAZINE) 10 MG tablet Take 1 tablet (10 mg total) by mouth every 6 (six) hours as needed (Nausea or vomiting). 30 tablet 1  . tetrahydrozoline-zinc (VISINE-AC) 0.05-0.25 % ophthalmic solution Place 1 drop into both eyes 2 (two) times daily as needed  (dry eyes).    . Triamcinolone Acetonide (NASACORT ALLERGY 24HR NA) Place 1 spray into the nose daily as needed (allergies).    . triamterene-hydrochlorothiazide (MAXZIDE) 75-50 MG tablet Take 1 tablet by mouth daily.    Loura Pardon Salicylate (ASPERCREME EX) Apply 1 application topically daily as needed (joint pain).    . vitamin B-12 (CYANOCOBALAMIN) 500 MCG tablet Take 500 mcg by mouth daily.     No current facility-administered medications for this visit.  Facility-Administered Medications Ordered in Other Visits  Medication Dose Route Frequency Provider Last Rate Last Dose  . heparin lock flush 100 unit/mL  500 Units Intracatheter Once PRN Cira Rue K, NP      . sodium chloride flush (NS) 0.9 % injection 10 mL  10 mL Intracatheter Once PRN Alla Feeling, NP        PHYSICAL EXAMINATION: ECOG PERFORMANCE STATUS: 1 - Symptomatic but completely ambulatory  Vitals:   04/16/18 1431  BP: (!) 142/64  Pulse: 85  Resp: 18  Temp: 98.5 F (36.9 C)  SpO2: 100%   Filed Weights   04/16/18 1431  Weight: 155 lb 11.2 oz (70.6 kg)    GENERAL:alert, no distress and comfortable SKIN: skin color, texture, turgor are normal, no rashes or significant lesions EYES: normal, Conjunctiva are pink and non-injected, sclera clear OROPHARYNX:no exudate, no erythema and lips, buccal mucosa, and tongue normal  NECK: supple, thyroid normal size, non-tender, without nodularity LYMPH:  no palpable lymphadenopathy in the cervical, axillary or inguinal LUNGS: clear to auscultation and percussion with normal breathing effort HEART: regular rate & rhythm and no murmurs and no lower extremity edema ABDOMEN:abdomen soft, non-tender and normal bowel sounds Musculoskeletal:no cyanosis of digits and no clubbing (+) Left foot in boot due to recent infection to toe NEURO: alert & oriented x 3 with fluent speech, no focal motor/sensory deficits  LABORATORY DATA:  I have reviewed the data as listed CBC  Latest Ref Rng & Units 04/16/2018 04/09/2018 04/02/2018  WBC 4.0 - 10.5 K/uL 2.6(L) 3.8(L) 4.7  Hemoglobin 12.0 - 15.0 g/dL 10.0(L) 10.1(L) 10.0(L)  Hematocrit 36.0 - 46.0 % 30.5(L) 31.5(L) 30.5(L)  Platelets 150 - 400 K/uL 220 240 267     CMP Latest Ref Rng & Units 04/16/2018 04/09/2018 04/02/2018  Glucose 70 - 99 mg/dL 136(H) 117(H) 109(H)  BUN 8 - 23 mg/dL '18 10 12  '$ Creatinine 0.44 - 1.00 mg/dL 0.93 0.84 0.86  Sodium 135 - 145 mmol/L 139 139 139  Potassium 3.5 - 5.1 mmol/L 3.6 3.9 4.0  Chloride 98 - 111 mmol/L 103 105 105  CO2 22 - 32 mmol/L '26 26 24  '$ Calcium 8.9 - 10.3 mg/dL 9.0 9.2 9.3  Total Protein 6.5 - 8.1 g/dL 6.7 6.7 6.9  Total Bilirubin 0.3 - 1.2 mg/dL 0.2(L) 0.2(L) 0.3  Alkaline Phos 38 - 126 U/L 87 105 112  AST 15 - 41 U/L '24 24 27  '$ ALT 0 - 44 U/L '12 14 15      '$ RADIOGRAPHIC STUDIES: I have personally reviewed the radiological images as listed and agreed with the findings in the report. No results found.   ASSESSMENT & PLAN:  Nicole Sellers is a 68 y.o. female with   1. Adenocarcinoma of the rectosigmoid colon, moderately differentiated,cT3N1M0, stage IIIB,MMR normal -Diagnosed in 09/2017.She is on neoadjuvantchemo. She completed 4 months of  FOLFOXwithUdenyca, plan for chemoRT with Xeloda before surgery.  -She started concurrent chemoRT with Xeloda '1500mg'$  BID on 04/09/18. She is tolerating moderately well with mild diarrhea. Currently under control with imodium -Labs reviewed, CBC and CMP WNL except WBC 2.6, Hg 10, BG 136. Iron panel and CEA are still pending. Overall adequate to continue ChemoRT with Xeloda.  -continue lab weekly  -F/u in 2 weeks   2. Iron deficiency anemia, secondary to #1 -She is currently on2oral ironpills a day.Will continue. -She previously declined IV iron. -Hgstableat 10today (04/16/18)   3. Depression -Due to her diagnosis and treatment. -Currently on  mirtazapine. -Her mood has been stable.  4. Diarrhea -She initially  complained of constipation that has changed to diarrheaafter chemo. -Has returned due to Xeloda and radiation  -She will continue to control it with lomotil    Plan -Labs reviewed, she is clinically stable.  -Continue ChemoRT with Xeloda.  -lab, flush, f/u in 2 weeks   No problem-specific Assessment & Plan notes found for this encounter.   No orders of the defined types were placed in this encounter.  All questions were answered. The patient knows to call the clinic with any problems, questions or concerns. No barriers to learning was detected. I spent 15 minutes counseling the patient face to face. The total time spent in the appointment was 20 minutes and more than 50% was on counseling and review of test results     Truitt Merle, MD 04/16/2018   I, Joslyn Devon, am acting as scribe for Truitt Merle, MD.   I have reviewed the above documentation for accuracy and completeness, and I agree with the above.

## 2018-04-13 NOTE — Telephone Encounter (Signed)
Called to see if pt is taking her medications as prescribed, no answer left message

## 2018-04-16 ENCOUNTER — Inpatient Hospital Stay: Payer: Medicare Other

## 2018-04-16 ENCOUNTER — Encounter: Payer: Self-pay | Admitting: Hematology

## 2018-04-16 ENCOUNTER — Other Ambulatory Visit: Payer: Self-pay

## 2018-04-16 ENCOUNTER — Telehealth: Payer: Self-pay | Admitting: Hematology

## 2018-04-16 ENCOUNTER — Inpatient Hospital Stay (HOSPITAL_BASED_OUTPATIENT_CLINIC_OR_DEPARTMENT_OTHER): Payer: Medicare Other | Admitting: Hematology

## 2018-04-16 ENCOUNTER — Other Ambulatory Visit: Payer: Self-pay | Admitting: Cardiovascular Disease

## 2018-04-16 ENCOUNTER — Ambulatory Visit
Admission: RE | Admit: 2018-04-16 | Discharge: 2018-04-16 | Disposition: A | Discharge status: Home / Self Care | Payer: Medicare Other | Source: Ambulatory Visit | Attending: Radiation Oncology | Admitting: Radiation Oncology

## 2018-04-16 VITALS — BP 142/64 | HR 85 | Temp 98.5°F | Resp 18 | Ht 66.0 in | Wt 155.7 lb

## 2018-04-16 DIAGNOSIS — Z923 Personal history of irradiation: Secondary | ICD-10-CM

## 2018-04-16 DIAGNOSIS — E039 Hypothyroidism, unspecified: Secondary | ICD-10-CM

## 2018-04-16 DIAGNOSIS — C20 Malignant neoplasm of rectum: Secondary | ICD-10-CM | POA: Diagnosis present

## 2018-04-16 DIAGNOSIS — K5900 Constipation, unspecified: Secondary | ICD-10-CM

## 2018-04-16 DIAGNOSIS — Z9221 Personal history of antineoplastic chemotherapy: Secondary | ICD-10-CM | POA: Diagnosis not present

## 2018-04-16 DIAGNOSIS — D509 Iron deficiency anemia, unspecified: Secondary | ICD-10-CM

## 2018-04-16 DIAGNOSIS — F17210 Nicotine dependence, cigarettes, uncomplicated: Secondary | ICD-10-CM | POA: Diagnosis not present

## 2018-04-16 DIAGNOSIS — E1151 Type 2 diabetes mellitus with diabetic peripheral angiopathy without gangrene: Secondary | ICD-10-CM | POA: Diagnosis not present

## 2018-04-16 DIAGNOSIS — K5730 Diverticulosis of large intestine without perforation or abscess without bleeding: Secondary | ICD-10-CM

## 2018-04-16 DIAGNOSIS — Z7984 Long term (current) use of oral hypoglycemic drugs: Secondary | ICD-10-CM

## 2018-04-16 DIAGNOSIS — R197 Diarrhea, unspecified: Secondary | ICD-10-CM | POA: Diagnosis not present

## 2018-04-16 DIAGNOSIS — I10 Essential (primary) hypertension: Secondary | ICD-10-CM

## 2018-04-16 DIAGNOSIS — Z510 Encounter for antineoplastic radiation therapy: Secondary | ICD-10-CM | POA: Diagnosis not present

## 2018-04-16 DIAGNOSIS — Z7982 Long term (current) use of aspirin: Secondary | ICD-10-CM | POA: Diagnosis not present

## 2018-04-16 DIAGNOSIS — M1990 Unspecified osteoarthritis, unspecified site: Secondary | ICD-10-CM | POA: Diagnosis not present

## 2018-04-16 DIAGNOSIS — Z79899 Other long term (current) drug therapy: Secondary | ICD-10-CM

## 2018-04-16 DIAGNOSIS — K219 Gastro-esophageal reflux disease without esophagitis: Secondary | ICD-10-CM | POA: Diagnosis not present

## 2018-04-16 DIAGNOSIS — E7800 Pure hypercholesterolemia, unspecified: Secondary | ICD-10-CM

## 2018-04-16 DIAGNOSIS — F329 Major depressive disorder, single episode, unspecified: Secondary | ICD-10-CM | POA: Diagnosis not present

## 2018-04-16 DIAGNOSIS — C189 Malignant neoplasm of colon, unspecified: Secondary | ICD-10-CM

## 2018-04-16 LAB — CMP (CANCER CENTER ONLY)
ALT: 12 U/L (ref 0–44)
AST: 24 U/L (ref 15–41)
Albumin: 3.4 g/dL — ABNORMAL LOW (ref 3.5–5.0)
Alkaline Phosphatase: 87 U/L (ref 38–126)
Anion gap: 10 (ref 5–15)
BUN: 18 mg/dL (ref 8–23)
CO2: 26 mmol/L (ref 22–32)
Calcium: 9 mg/dL (ref 8.9–10.3)
Chloride: 103 mmol/L (ref 98–111)
Creatinine: 0.93 mg/dL (ref 0.44–1.00)
GFR, Est AFR Am: >60 mL/min (ref >60)
GFR, Estimated: >60 mL/min (ref >60)
Glucose, Bld: 136 mg/dL — ABNORMAL HIGH (ref 70–99)
Potassium: 3.6 mmol/L (ref 3.5–5.1)
Sodium: 139 mmol/L (ref 135–145)
Total Bilirubin: 0.2 mg/dL — ABNORMAL LOW (ref 0.3–1.2)
Total Protein: 6.7 g/dL (ref 6.5–8.1)

## 2018-04-16 LAB — CBC WITH DIFFERENTIAL (CANCER CENTER ONLY)
Abs Immature Granulocytes: 0 10*3/uL (ref 0.00–0.07)
Basophils Absolute: 0 10*3/uL (ref 0.0–0.1)
Basophils Relative: 1 %
Eosinophils Absolute: 0.2 10*3/uL (ref 0.0–0.5)
Eosinophils Relative: 7 %
HCT: 30.5 % — ABNORMAL LOW (ref 36.0–46.0)
Hemoglobin: 10 g/dL — ABNORMAL LOW (ref 12.0–15.0)
Immature Granulocytes: 0 %
Lymphocytes Relative: 27 %
Lymphs Abs: 0.7 10*3/uL (ref 0.7–4.0)
MCH: 32.7 pg (ref 26.0–34.0)
MCHC: 32.8 g/dL (ref 30.0–36.0)
MCV: 99.7 fL (ref 80.0–100.0)
Monocytes Absolute: 0.2 10*3/uL (ref 0.1–1.0)
Monocytes Relative: 8 %
Neutro Abs: 1.5 10*3/uL — ABNORMAL LOW (ref 1.7–7.7)
Neutrophils Relative %: 57 %
Platelet Count: 220 10*3/uL (ref 150–400)
RBC: 3.06 MIL/uL — ABNORMAL LOW (ref 3.87–5.11)
RDW: 13.1 % (ref 11.5–15.5)
WBC Count: 2.6 10*3/uL — ABNORMAL LOW (ref 4.0–10.5)
nRBC: 0 % (ref 0.0–0.2)

## 2018-04-16 LAB — IRON AND TIBC
Iron: 59 ug/dL (ref 41–142)
Saturation Ratios: 24 % (ref 21–57)
TIBC: 246 ug/dL (ref 236–444)
UIBC: 187 ug/dL (ref 120–384)

## 2018-04-16 LAB — CEA (IN HOUSE-CHCC): CEA (CHCC-In House): 5.5 ng/mL — ABNORMAL HIGH (ref 0.00–5.00)

## 2018-04-16 LAB — FERRITIN: Ferritin: 241 ng/mL (ref 11–307)

## 2018-04-16 NOTE — Telephone Encounter (Signed)
No los per 4/6.

## 2018-04-17 ENCOUNTER — Ambulatory Visit
Admission: RE | Admit: 2018-04-17 | Discharge: 2018-04-17 | Disposition: A | Discharge status: Home / Self Care | Payer: Medicare Other | Source: Ambulatory Visit | Attending: Radiation Oncology | Admitting: Radiation Oncology

## 2018-04-17 ENCOUNTER — Other Ambulatory Visit: Payer: Self-pay

## 2018-04-17 DIAGNOSIS — Z510 Encounter for antineoplastic radiation therapy: Secondary | ICD-10-CM | POA: Diagnosis not present

## 2018-04-18 ENCOUNTER — Other Ambulatory Visit: Payer: Self-pay

## 2018-04-18 ENCOUNTER — Ambulatory Visit
Admission: RE | Admit: 2018-04-18 | Discharge: 2018-04-18 | Disposition: A | Discharge status: Home / Self Care | Payer: Medicare Other | Source: Ambulatory Visit | Attending: Radiation Oncology | Admitting: Radiation Oncology

## 2018-04-18 DIAGNOSIS — Z510 Encounter for antineoplastic radiation therapy: Secondary | ICD-10-CM | POA: Diagnosis not present

## 2018-04-19 ENCOUNTER — Ambulatory Visit
Admission: RE | Admit: 2018-04-19 | Discharge: 2018-04-19 | Disposition: A | Discharge status: Home / Self Care | Payer: Medicare Other | Source: Ambulatory Visit | Attending: Radiation Oncology | Admitting: Radiation Oncology

## 2018-04-19 ENCOUNTER — Other Ambulatory Visit: Payer: Self-pay

## 2018-04-19 DIAGNOSIS — Z510 Encounter for antineoplastic radiation therapy: Secondary | ICD-10-CM | POA: Diagnosis not present

## 2018-04-20 ENCOUNTER — Ambulatory Visit
Admission: RE | Admit: 2018-04-20 | Discharge: 2018-04-20 | Disposition: A | Discharge status: Home / Self Care | Payer: Medicare Other | Source: Ambulatory Visit | Attending: Radiation Oncology | Admitting: Radiation Oncology

## 2018-04-20 ENCOUNTER — Other Ambulatory Visit: Payer: Self-pay

## 2018-04-20 DIAGNOSIS — Z510 Encounter for antineoplastic radiation therapy: Secondary | ICD-10-CM | POA: Diagnosis not present

## 2018-04-23 ENCOUNTER — Inpatient Hospital Stay: Payer: Medicare Other

## 2018-04-23 ENCOUNTER — Other Ambulatory Visit: Payer: Self-pay

## 2018-04-23 ENCOUNTER — Ambulatory Visit
Admission: RE | Admit: 2018-04-23 | Discharge: 2018-04-23 | Disposition: A | Discharge status: Home / Self Care | Payer: Medicare Other | Source: Ambulatory Visit | Attending: Radiation Oncology | Admitting: Radiation Oncology

## 2018-04-23 DIAGNOSIS — C20 Malignant neoplasm of rectum: Secondary | ICD-10-CM | POA: Diagnosis not present

## 2018-04-23 DIAGNOSIS — Z510 Encounter for antineoplastic radiation therapy: Secondary | ICD-10-CM | POA: Diagnosis not present

## 2018-04-23 LAB — CMP (CANCER CENTER ONLY)
ALT: 9 U/L (ref 0–44)
AST: 20 U/L (ref 15–41)
Albumin: 3.3 g/dL — ABNORMAL LOW (ref 3.5–5.0)
Alkaline Phosphatase: 86 U/L (ref 38–126)
Anion gap: 11 (ref 5–15)
BUN: 9 mg/dL (ref 8–23)
CO2: 25 mmol/L (ref 22–32)
Calcium: 9.4 mg/dL (ref 8.9–10.3)
Chloride: 103 mmol/L (ref 98–111)
Creatinine: 0.9 mg/dL (ref 0.44–1.00)
GFR, Est AFR Am: >60 mL/min (ref >60)
GFR, Estimated: >60 mL/min (ref >60)
Glucose, Bld: 132 mg/dL — ABNORMAL HIGH (ref 70–99)
Potassium: 4 mmol/L (ref 3.5–5.1)
Sodium: 139 mmol/L (ref 135–145)
Total Bilirubin: 0.2 mg/dL — ABNORMAL LOW (ref 0.3–1.2)
Total Protein: 6.4 g/dL — ABNORMAL LOW (ref 6.5–8.1)

## 2018-04-23 LAB — CBC WITH DIFFERENTIAL (CANCER CENTER ONLY)
Abs Immature Granulocytes: 0.01 10*3/uL (ref 0.00–0.07)
Basophils Absolute: 0 10*3/uL (ref 0.0–0.1)
Basophils Relative: 0 %
Eosinophils Absolute: 0.3 10*3/uL (ref 0.0–0.5)
Eosinophils Relative: 9 %
HCT: 31.2 % — ABNORMAL LOW (ref 36.0–46.0)
Hemoglobin: 10.1 g/dL — ABNORMAL LOW (ref 12.0–15.0)
Immature Granulocytes: 0 %
Lymphocytes Relative: 18 %
Lymphs Abs: 0.5 10*3/uL — ABNORMAL LOW (ref 0.7–4.0)
MCH: 32.1 pg (ref 26.0–34.0)
MCHC: 32.4 g/dL (ref 30.0–36.0)
MCV: 99 fL (ref 80.0–100.0)
Monocytes Absolute: 0.3 10*3/uL (ref 0.1–1.0)
Monocytes Relative: 9 %
Neutro Abs: 1.9 10*3/uL (ref 1.7–7.7)
Neutrophils Relative %: 64 %
Platelet Count: 202 10*3/uL (ref 150–400)
RBC: 3.15 MIL/uL — ABNORMAL LOW (ref 3.87–5.11)
RDW: 13.2 % (ref 11.5–15.5)
WBC Count: 2.9 10*3/uL — ABNORMAL LOW (ref 4.0–10.5)
nRBC: 0 % (ref 0.0–0.2)

## 2018-04-24 ENCOUNTER — Telehealth: Payer: Self-pay

## 2018-04-24 ENCOUNTER — Ambulatory Visit
Admission: RE | Admit: 2018-04-24 | Discharge: 2018-04-24 | Disposition: A | Discharge status: Home / Self Care | Payer: Medicare Other | Source: Ambulatory Visit | Attending: Radiation Oncology | Admitting: Radiation Oncology

## 2018-04-24 ENCOUNTER — Other Ambulatory Visit: Payer: Self-pay

## 2018-04-24 DIAGNOSIS — Z510 Encounter for antineoplastic radiation therapy: Secondary | ICD-10-CM | POA: Diagnosis not present

## 2018-04-24 NOTE — Telephone Encounter (Signed)
Left pt v/m to call office so I can ask her if she is still taking her cholesterol medication because her ins sent [place] a letter stating she may not be taking it as directed. YRL,RMA

## 2018-04-25 ENCOUNTER — Ambulatory Visit
Admission: RE | Admit: 2018-04-25 | Discharge: 2018-04-25 | Disposition: A | Discharge status: Home / Self Care | Payer: Medicare Other | Source: Ambulatory Visit | Attending: Radiation Oncology | Admitting: Radiation Oncology

## 2018-04-25 ENCOUNTER — Other Ambulatory Visit: Payer: Self-pay

## 2018-04-25 DIAGNOSIS — Z510 Encounter for antineoplastic radiation therapy: Secondary | ICD-10-CM | POA: Diagnosis not present

## 2018-04-26 ENCOUNTER — Other Ambulatory Visit: Payer: Self-pay | Admitting: Hematology

## 2018-04-26 ENCOUNTER — Ambulatory Visit
Admission: RE | Admit: 2018-04-26 | Discharge: 2018-04-26 | Disposition: A | Discharge status: Home / Self Care | Payer: Medicare Other | Source: Ambulatory Visit | Attending: Radiation Oncology | Admitting: Radiation Oncology

## 2018-04-26 ENCOUNTER — Other Ambulatory Visit: Payer: Self-pay

## 2018-04-26 DIAGNOSIS — C189 Malignant neoplasm of colon, unspecified: Secondary | ICD-10-CM

## 2018-04-26 DIAGNOSIS — Z510 Encounter for antineoplastic radiation therapy: Secondary | ICD-10-CM | POA: Diagnosis not present

## 2018-04-26 MED FILL — CAPECITABINE 500 MG TABS: 500 | 21 days supply | Qty: 90 | Fill #1

## 2018-04-26 NOTE — Progress Notes (Signed)
Nicole Sellers   [phone] [phone]   Clinic Follow up Note   Patient Care Team: Reynold Bowen, MD as PCP - General (Endocrinology) Lorretta Harp, MD as PCP - Cardiology (Cardiology)  Date of Service:  04/30/2018  CHIEF COMPLAINT: F/u of Rectal cancer  SUMMARY OF ONCOLOGIC HISTORY: Oncology History   Cancer Staging Rectal cancer [facility]) Staging form: Colon and Rectum, AJCC 8th Edition - Clinical stage from 10/04/2017: Stage IIIB (cT3, cN1, cM0) - Signed by Truitt Merle, MD on 11/18/2017       Rectal cancer ([place])   10/04/2017 Procedure    Colonoscopy per Dr. Collene Mares 10/04/17 shows a large, non-obstructing horse-shoe shaped mass was found in the recto-sigmoid colon at 10 cm; the mass was partially circumferential involving one-half of the lumen circumference. The mass measured seven cm in length    10/04/2017 Initial Biopsy    Rectum, at 10-17 cm, mass biopsy -invasive adenocarcinoma, moderately differentiated  ICH stains for MLH1, MSH2, MSH6, and PMS2 are intact (normal)    10/04/2017 Cancer Staging    Staging form: Colon and Rectum, AJCC 8th Edition - Clinical stage from 10/04/2017: Stage IIIB (cT3, cN1, cM0) - Signed by Truitt Merle, MD on 11/18/2017    10/25/2017 Imaging    CT CAP IMPRESSION: 1. Irregular eccentric mass within the sigmoid colon. There are at least 3 abnormal appearing lymph node adjacent to the sigmoid colon at the level of the mass concerning for the possibility of local nodal metastatic disease. 2. Multiple pulmonary nodules as described above. These are indeterminate in etiology. Recommend attention on follow-up.    11/06/2017 Initial Diagnosis    Adenocarcinoma of colon ([place])    11/16/2017 Imaging    Pelvic MRI 11/16/17  IMPRESSION: Rectal adenocarcinoma T stage:   T3 Rectal adenocarcinoma N stage:  N1 Distance from tumor to the anal sphincter is 6.9 cm.    11/29/2017 - 02/15/2018 Chemotherapy    totalneoadjuvant chemo  FOLFOXq2 weeks x4 months, starting 11/29/17-03/07/18, followed by concurrent chemoRT. Completed 8 cycles of FOLFOX on 03/07/18    01/17/2018 Imaging    CT AP W Contrast 01/17/18 IMPRESSION: 1. Previously seen soft tissue mass involving the left lateral wall the upper rectum is no longer visualized. 2. Decreased size of left perirectal lymph nodes since prior exam. 3. No other sites of metastatic disease within the abdomen or pelvis. 4. Colonic diverticulosis, without radiographic evidence of diverticulitis. 5. Stable small adjacent paraumbilical hernias containing only fat.    04/09/2018 -  Chemotherapy    Concurrent ChemoRT with Xeloda '1500mg'$  BID on days of radiation M-F starting 04/09/18      04/09/2018 -  Radiation Therapy    concurrent chemoRT with Dr. Lisbeth Renshaw starting 04/09/18      CURRENT THERAPY:  concurrent chemoRT with Xeloda '1500mg'$  BID on days of radiation M-F started 04/09/18, held the week of 04/30/2018 due to severe diarrhea   INTERVAL HISTORY:  Nicole Sellers is here for a follow up of treatment. She presents to the clinic today by herself. She notes having watery diarrhea with little amount of blood in stool. She has been taking Imodium 2 with first BM and 1 for each afterward along with lomotil without relief.   REVIEW OF SYSTEMS:   Constitutional: Denies fevers, chills or abnormal weight loss Eyes: Denies blurriness of vision Ears, nose, mouth, throat, and face: Denies mucositis or sore throat Respiratory: Denies cough, dyspnea or wheezes Cardiovascular: Denies palpitation, chest discomfort or lower extremity  swelling Gastrointestinal:  Denies nausea, heartburn (+) Significant diarrhea  Skin: Denies abnormal skin rashes Lymphatics: Denies new lymphadenopathy or easy bruising Neurological:Denies numbness, tingling or new weaknesses Behavioral/Psych: Mood is stable, no new changes  All other systems were reviewed with the patient and are negative.  MEDICAL HISTORY:   Past Medical History:  Diagnosis Date  . Allergy   . Anemia   . Arthritis    "joints sometimes" (12/25/2012)  . Clotting disorder ([place])   . Colon cancer ([place])   . Critical lower limb ischemia   . Gangrene of toe, Rt second toe 12/25/2012  . GERD (gastroesophageal reflux disease)   . High cholesterol   . Hypertension   . Hypothyroidism   . PAD (peripheral artery disease) ([place])   . S/P arterial stent, 12/25/13, successful diamondback orbital rotational arthrectomy, PTA using chocolate  balloon and stenting using I DEV stent of long segment calcified high-grade proximal and mid r 12/25/2012  . Tobacco abuse   . Type II diabetes mellitus ([place])     SURGICAL HISTORY: Past Surgical History:  Procedure Laterality Date  . ABDOMINAL AORTAGRAM  12/20/2012   Procedure: ABDOMINAL AORTAGRAM;  Surgeon: Lorretta Harp, MD;  Location: [facility] CATH LAB;  Service: Cardiovascular;;  . ABDOMINAL HYSTERECTOMY  2000  . ANGIOPLASTY / STENTING FEMORAL Right 12/25/2012  . ATHERECTOMY Right 12/25/2012   Procedure: ATHERECTOMY;  Surgeon: Lorretta Harp, MD;  Location: [facility] CATH LAB;  Service: Cardiovascular;  Laterality: Right;  right SFA  . HERNIA REPAIR    . LOWER EXTREMITY ANGIOGRAM N/A 12/20/2012   Procedure: LOWER EXTREMITY ANGIOGRAM;  Surgeon: Lorretta Harp, MD;  Location: [facility] CATH LAB;  Service: Cardiovascular;  Laterality: N/A;  . LOWER EXTREMITY ANGIOGRAM N/A 12/25/2012   Procedure: LOWER EXTREMITY ANGIOGRAM;  Surgeon: Lorretta Harp, MD;  Location: [facility] CATH LAB;  Service: Cardiovascular;  Laterality: N/A;  . LOWER EXTREMITY INTERVENTION  05/08/2017  . LOWER EXTREMITY INTERVENTION Bilateral 05/08/2017   Procedure: LOWER EXTREMITY INTERVENTION;  Surgeon: Lorretta Harp, MD;  Location: [place] CV LAB;  Service: Cardiovascular;  Laterality: Bilateral;  . PERIPHERAL VASCULAR BALLOON ANGIOPLASTY Right 05/08/2017   Procedure: PERIPHERAL VASCULAR BALLOON ANGIOPLASTY;  Surgeon: Lorretta Harp,  MD;  Location: [place] CV LAB;  Service: Cardiovascular;  Laterality: Right;  sfa  . PERIPHERAL VASCULAR INTERVENTION Right 05/08/2017   Procedure: PERIPHERAL VASCULAR INTERVENTION;  Surgeon: Lorretta Harp, MD;  Location: [place] CV LAB;  Service: Cardiovascular;  Laterality: Right;  ext iliac  . PORTACATH PLACEMENT Right 11/22/2017   Procedure: INSERTION PORT-A-CATH;  Surgeon: Leighton Ruff, MD;  Location: [facility] ORS;  Service: General;  Laterality: Right;  . TOE SURGERY Right    2n toe   . UMBILICAL HERNIA REPAIR  2000    I have reviewed the social history and family history with the patient and they are unchanged from previous note.  ALLERGIES:  is allergic to kiwi extract; lisinopril; and plavix [clopidogrel bisulfate].  MEDICATIONS:  Current Outpatient Medications  Medication Sig Dispense Refill  . acetaminophen (TYLENOL) 500 MG tablet Take 500 mg by mouth 2 (two) times daily as needed for moderate pain or headache.     . albuterol (PROVENTIL HFA;VENTOLIN HFA) 108 (90 BASE) MCG/ACT inhaler Inhale 2 puffs into the lungs every 6 (six) hours as needed for wheezing or shortness of breath.    Marland Kitchen amLODipine (NORVASC) 5 MG tablet TAKE 1 TABLET BY MOUTH EVERY DAY 90 tablet 2  . aspirin 81 MG tablet  Take 81 mg by mouth daily.    Marland Kitchen atorvastatin (LIPITOR) 10 MG tablet Take 1 tablet (10 mg total) by mouth daily. 90 tablet 1  . Calcium Carbonate-Vitamin D (CALCIUM 600+D PO) Take 1 tablet by mouth daily.    . capecitabine (XELODA) 500 MG tablet Take 3 tablets (1,500 mg total) by mouth 2 (two) times daily after a meal. Take on the days of radiation only, Monday through Friday 90 tablet 1  . cetirizine (ZYRTEC) 10 MG tablet Take 10 mg by mouth daily.    . cholecalciferol (VITAMIN D) 1000 units tablet Take 4,000 Units by mouth daily.     . clopidogrel (PLAVIX) 75 MG tablet Take 1 tablet (75 mg total) by mouth daily with breakfast. 90 tablet 3  . diphenoxylate-atropine (LOMOTIL) 2.5-0.025 MG  tablet Take 1-2 tablets by mouth 4 (four) times daily as needed for diarrhea or loose stools. 40 tablet 2  . FERREX 150 150 MG capsule Take 150 mg by mouth 2 (two) times daily.  11  . gabapentin (NEURONTIN) 300 MG capsule Take 300 mg by mouth daily.     Marland Kitchen glimepiride (AMARYL) 1 MG tablet Take 1 mg by mouth 2 (two) times daily.    Marland Kitchen levothyroxine (SYNTHROID, LEVOTHROID) 50 MCG tablet Take 1 tablet (50 mcg total) by mouth daily. 90 tablet 1  . lidocaine-prilocaine (EMLA) cream Apply to affected area once 30 g 3  . magic mouthwash w/lidocaine SOLN Take 5 mLs by mouth 3 (three) times daily. Swish and Spit 240 mL 0  . metFORMIN (GLUCOPHAGE-XR) 500 MG 24 hr tablet TAKE 2 TABLETS BY MOUTH  TWICE A DAY 60 tablet 0  . mirtazapine (REMERON) 7.5 MG tablet Take 2 tablets (15 mg total) by mouth at bedtime. 60 tablet 2  . Misc Natural Products (OSTEO BI-FLEX ADV TRIPLE ST) TABS Take 1 tablet by mouth 2 (two) times daily.    . Multiple Vitamin (MULTIVITAMIN) capsule Take 1 capsule by mouth daily.    . ondansetron (ZOFRAN) 8 MG tablet Take 1 tablet (8 mg total) by mouth 2 (two) times daily as needed for refractory nausea / vomiting. Start on day 3 after chemotherapy. 30 tablet 1  . prochlorperazine (COMPAZINE) 10 MG tablet Take 1 tablet (10 mg total) by mouth every 6 (six) hours as needed (Nausea or vomiting). 30 tablet 1  . tetrahydrozoline-zinc (VISINE-AC) 0.05-0.25 % ophthalmic solution Place 1 drop into both eyes 2 (two) times daily as needed (dry eyes).    . Triamcinolone Acetonide (NASACORT ALLERGY 24HR NA) Place 1 spray into the nose daily as needed (allergies).    . triamterene-hydrochlorothiazide (MAXZIDE) 75-50 MG tablet Take 1 tablet by mouth daily.    Loura Pardon Salicylate (ASPERCREME EX) Apply 1 application topically daily as needed (joint pain).    . vitamin B-12 (CYANOCOBALAMIN) 500 MCG tablet Take 500 mcg by mouth daily.     No current facility-administered medications for this visit.     Facility-Administered Medications Ordered in Other Visits  Medication Dose Route Frequency Provider Last Rate Last Dose  . heparin lock flush 100 unit/mL  500 Units Intracatheter Once PRN Cira Rue K, NP      . sodium chloride flush (NS) 0.9 % injection 10 mL  10 mL Intracatheter Once PRN Alla Feeling, NP        PHYSICAL EXAMINATION: ECOG PERFORMANCE STATUS: 2 - Symptomatic, <50% confined to bed  Vitals:   04/30/18 1534 04/30/18 1535  BP: 140/69 140/69  Pulse: 89  95  Resp: 17 17  Temp:    SpO2: 100% 100%   Filed Weights   04/30/18 1500  Weight: 151 lb 12.8 oz (68.9 kg)    GENERAL:alert, no distress and comfortable SKIN: skin color, texture, turgor are normal, no rashes or significant lesions EYES: normal, Conjunctiva are pink and non-injected, sclera clear OROPHARYNX:no exudate, no erythema and lips, buccal mucosa, and tongue normal  NECK: supple, thyroid normal size, non-tender, without nodularity LYMPH:  no palpable lymphadenopathy in the cervical, axillary or inguinal LUNGS: clear to auscultation and percussion with normal breathing effort HEART: regular rate & rhythm and no murmurs and no lower extremity edema ABDOMEN:abdomen soft, non-tender and normal bowel sounds Musculoskeletal:no cyanosis of digits and no clubbing  NEURO: alert & oriented x 3 with fluent speech, no focal motor/sensory deficits RECTAL: Mild skin hyperpigmentation and erythema with no skin breakdown from radiation  LABORATORY DATA:  I have reviewed the data as listed CBC Latest Ref Rng & Units 04/30/2018 04/23/2018 04/16/2018  WBC 4.0 - 10.5 K/uL 3.5(L) 2.9(L) 2.6(L)  Hemoglobin 12.0 - 15.0 g/dL 9.8(L) 10.1(L) 10.0(L)  Hematocrit 36.0 - 46.0 % 29.0(L) 31.2(L) 30.5(L)  Platelets 150 - 400 K/uL 267 202 220     CMP Latest Ref Rng & Units 04/30/2018 04/23/2018 04/16/2018  Glucose 70 - 99 mg/dL 90 132(H) 136(H)  BUN 8 - 23 mg/dL 7(L) 9 18  Creatinine 0.44 - 1.00 mg/dL 0.94 0.90 0.93  Sodium 135 -  145 mmol/L 139 139 139  Potassium 3.5 - 5.1 mmol/L 3.4(L) 4.0 3.6  Chloride 98 - 111 mmol/L 103 103 103  CO2 22 - 32 mmol/L '24 25 26  '$ Calcium 8.9 - 10.3 mg/dL 9.2 9.4 9.0  Total Protein 6.5 - 8.1 g/dL 6.7 6.4(L) 6.7  Total Bilirubin 0.3 - 1.2 mg/dL 0.3 0.2(L) 0.2(L)  Alkaline Phos 38 - 126 U/L 86 86 87  AST 15 - 41 U/L '18 20 24  '$ ALT 0 - 44 U/L '9 9 12      '$ RADIOGRAPHIC STUDIES: I have personally reviewed the radiological images as listed and agreed with the findings in the report. No results found.   ASSESSMENT & PLAN:  JENYFER TRAWICK is a 68 y.o. female with   1. Adenocarcinoma of the rectosigmoid colon, moderately differentiated,cT3N1M0, stage IIIB,MMR normal -Diagnosed in 09/2017.She is on neoadjuvantchemo. She completed 4 months ofFOLFOXwithUdenyca, planforchemoRTwith Xelodabeforesurgery. -She started concurrent chemoRT with Xeloda '1500mg'$  BIDon 04/09/18. She is tolerating moderately well with mild diarrhea. Currently under control with imodium -She has been experiencing significant diarrhea. I reviewed imodium and lomotil use with her. I will hold her Xeloda for now due to severe diarrhea If her diarrhea improves in next few days she will restart for this Thursday and Friday. She is agreeable.  -Labs reviewed, CBC WNL except WBC 3.5, Hg 9.8, CMP WNL except K3.4. Overall adequate to continue radiation.  -continue lab weekly  -F/u next week.   2. Iron deficiency anemia, secondary to #1 -She is currently on2oral ironpills a day.Will continue. -She previously declined IV iron. -Hgstableat 9.8today (04/30/18)   3. Depression -Due to her diagnosis and treatment. -Currently on mirtazapine. -Her mood has been stable.  4. Diarrhea -She initially complained of constipation that has changed to diarrheaafter chemo. -Diarrhea has returned due to Xeloda and radiation and now significant.  -I instructed her to take Imodium twice at first sign of diarrhea and  then every 3 hours or she can alternate with lomotil and take up to  8 tabs each daily.  -Will hold Xeloda this week. If diarrhea is controlled she can restart for this Thursday and Friday  -I strongly encouraged her to drink plenty of water, and increase soup intake.   5. HTN -BP elevated lately, at 152/70 today (04/30/18).  -Due to her severe diarrhea, I checked her orthostatic, which was negative.  She has been drinking fluids adequately. -Continue amlodipine and Maxzide.  I recommend her to monitor BP at home. We discussed holding Maxide if her blood pressure becomes low due to diarrhea    Plan -Labs reviewed. Given severe diarrhea, will hold Xeloda with week.  -Continue RT  -lab, flush, f/u in 2 weeks    No problem-specific Assessment & Plan notes found for this encounter.   Orders Placed This Encounter  Procedures  . Orthostatic vital signs    Standing Status:   Standing    Number of Occurrences:   1   All questions were answered. The patient knows to call the clinic with any problems, questions or concerns. No barriers to learning was detected. I spent 15 minutes counseling the patient face to face. The total time spent in the appointment was 20 minutes and more than 50% was on counseling and review of test results     Truitt Merle, MD 04/30/2018   I, Joslyn Devon, am acting as scribe for Truitt Merle, MD.   I have reviewed the above documentation for accuracy and completeness, and I agree with the above.

## 2018-04-27 ENCOUNTER — Ambulatory Visit
Admission: RE | Admit: 2018-04-27 | Discharge: 2018-04-27 | Disposition: A | Discharge status: Home / Self Care | Payer: Medicare Other | Source: Ambulatory Visit | Attending: Radiation Oncology | Admitting: Radiation Oncology

## 2018-04-27 ENCOUNTER — Other Ambulatory Visit: Payer: Self-pay

## 2018-04-27 DIAGNOSIS — Z510 Encounter for antineoplastic radiation therapy: Secondary | ICD-10-CM | POA: Diagnosis not present

## 2018-04-30 ENCOUNTER — Ambulatory Visit
Admission: RE | Admit: 2018-04-30 | Discharge: 2018-04-30 | Disposition: A | Discharge status: Home / Self Care | Payer: Medicare Other | Source: Ambulatory Visit | Attending: Radiation Oncology | Admitting: Radiation Oncology

## 2018-04-30 ENCOUNTER — Other Ambulatory Visit: Payer: Self-pay

## 2018-04-30 ENCOUNTER — Inpatient Hospital Stay: Payer: Medicare Other

## 2018-04-30 ENCOUNTER — Inpatient Hospital Stay (HOSPITAL_BASED_OUTPATIENT_CLINIC_OR_DEPARTMENT_OTHER): Payer: Medicare Other | Admitting: Hematology

## 2018-04-30 ENCOUNTER — Telehealth: Payer: Self-pay | Admitting: Hematology

## 2018-04-30 ENCOUNTER — Encounter: Payer: Self-pay | Admitting: Hematology

## 2018-04-30 VITALS — BP 140/69 | HR 95 | Temp 98.1°F | Resp 17 | Ht 66.0 in | Wt 151.8 lb

## 2018-04-30 DIAGNOSIS — C20 Malignant neoplasm of rectum: Secondary | ICD-10-CM

## 2018-04-30 DIAGNOSIS — Z510 Encounter for antineoplastic radiation therapy: Secondary | ICD-10-CM | POA: Diagnosis not present

## 2018-04-30 DIAGNOSIS — M1990 Unspecified osteoarthritis, unspecified site: Secondary | ICD-10-CM

## 2018-04-30 DIAGNOSIS — D509 Iron deficiency anemia, unspecified: Secondary | ICD-10-CM | POA: Diagnosis not present

## 2018-04-30 DIAGNOSIS — F329 Major depressive disorder, single episode, unspecified: Secondary | ICD-10-CM

## 2018-04-30 DIAGNOSIS — F17210 Nicotine dependence, cigarettes, uncomplicated: Secondary | ICD-10-CM

## 2018-04-30 DIAGNOSIS — Z7984 Long term (current) use of oral hypoglycemic drugs: Secondary | ICD-10-CM

## 2018-04-30 DIAGNOSIS — E039 Hypothyroidism, unspecified: Secondary | ICD-10-CM

## 2018-04-30 DIAGNOSIS — K5900 Constipation, unspecified: Secondary | ICD-10-CM

## 2018-04-30 DIAGNOSIS — Z9221 Personal history of antineoplastic chemotherapy: Secondary | ICD-10-CM

## 2018-04-30 DIAGNOSIS — Z79899 Other long term (current) drug therapy: Secondary | ICD-10-CM

## 2018-04-30 DIAGNOSIS — I10 Essential (primary) hypertension: Secondary | ICD-10-CM

## 2018-04-30 DIAGNOSIS — Z923 Personal history of irradiation: Secondary | ICD-10-CM

## 2018-04-30 DIAGNOSIS — E1151 Type 2 diabetes mellitus with diabetic peripheral angiopathy without gangrene: Secondary | ICD-10-CM

## 2018-04-30 DIAGNOSIS — Z7982 Long term (current) use of aspirin: Secondary | ICD-10-CM

## 2018-04-30 DIAGNOSIS — E7800 Pure hypercholesterolemia, unspecified: Secondary | ICD-10-CM

## 2018-04-30 DIAGNOSIS — C189 Malignant neoplasm of colon, unspecified: Secondary | ICD-10-CM

## 2018-04-30 DIAGNOSIS — K219 Gastro-esophageal reflux disease without esophagitis: Secondary | ICD-10-CM

## 2018-04-30 DIAGNOSIS — K5730 Diverticulosis of large intestine without perforation or abscess without bleeding: Secondary | ICD-10-CM

## 2018-04-30 DIAGNOSIS — R197 Diarrhea, unspecified: Secondary | ICD-10-CM

## 2018-04-30 LAB — CMP (CANCER CENTER ONLY)
ALT: 9 U/L (ref 0–44)
AST: 18 U/L (ref 15–41)
Albumin: 3.3 g/dL — ABNORMAL LOW (ref 3.5–5.0)
Alkaline Phosphatase: 86 U/L (ref 38–126)
Anion gap: 12 (ref 5–15)
BUN: 7 mg/dL — ABNORMAL LOW (ref 8–23)
CO2: 24 mmol/L (ref 22–32)
Calcium: 9.2 mg/dL (ref 8.9–10.3)
Chloride: 103 mmol/L (ref 98–111)
Creatinine: 0.94 mg/dL (ref 0.44–1.00)
GFR, Est AFR Am: >60 mL/min (ref >60)
GFR, Estimated: >60 mL/min (ref >60)
Glucose, Bld: 90 mg/dL (ref 70–99)
Potassium: 3.4 mmol/L — ABNORMAL LOW (ref 3.5–5.1)
Sodium: 139 mmol/L (ref 135–145)
Total Bilirubin: 0.3 mg/dL (ref 0.3–1.2)
Total Protein: 6.7 g/dL (ref 6.5–8.1)

## 2018-04-30 LAB — CBC WITH DIFFERENTIAL (CANCER CENTER ONLY)
Abs Immature Granulocytes: 0.01 10*3/uL (ref 0.00–0.07)
Basophils Absolute: 0 10*3/uL (ref 0.0–0.1)
Basophils Relative: 0 %
Eosinophils Absolute: 0.3 10*3/uL (ref 0.0–0.5)
Eosinophils Relative: 10 %
HCT: 29 % — ABNORMAL LOW (ref 36.0–46.0)
Hemoglobin: 9.8 g/dL — ABNORMAL LOW (ref 12.0–15.0)
Immature Granulocytes: 0 %
Lymphocytes Relative: 13 %
Lymphs Abs: 0.5 10*3/uL — ABNORMAL LOW (ref 0.7–4.0)
MCH: 32.7 pg (ref 26.0–34.0)
MCHC: 33.8 g/dL (ref 30.0–36.0)
MCV: 96.7 fL (ref 80.0–100.0)
Monocytes Absolute: 0.4 10*3/uL (ref 0.1–1.0)
Monocytes Relative: 12 %
Neutro Abs: 2.3 10*3/uL (ref 1.7–7.7)
Neutrophils Relative %: 65 %
Platelet Count: 267 10*3/uL (ref 150–400)
RBC: 3 MIL/uL — ABNORMAL LOW (ref 3.87–5.11)
RDW: 13.8 % (ref 11.5–15.5)
WBC Count: 3.5 10*3/uL — ABNORMAL LOW (ref 4.0–10.5)
nRBC: 0 % (ref 0.0–0.2)

## 2018-04-30 MED ORDER — SODIUM CHLORIDE 0.9% FLUSH
10.0000 mL | Freq: Once | INTRAVENOUS | Status: AC | PRN
  Administered 2018-04-30: 10 mL
  Filled 2018-04-30: qty 10

## 2018-04-30 MED ORDER — HEPARIN SOD (PORK) LOCK FLUSH 100 UNIT/ML IV SOLN
250.0000 [IU] | Freq: Once | INTRAVENOUS | Status: AC | PRN
  Administered 2018-04-30: 250 [IU]
  Filled 2018-04-30: qty 5

## 2018-04-30 NOTE — Telephone Encounter (Signed)
Scheduled appt per 4/20 los. °

## 2018-05-01 ENCOUNTER — Ambulatory Visit: Payer: Medicare Other

## 2018-05-01 ENCOUNTER — Other Ambulatory Visit: Payer: Self-pay

## 2018-05-01 ENCOUNTER — Ambulatory Visit
Admission: RE | Admit: 2018-05-01 | Discharge: 2018-05-01 | Disposition: A | Discharge status: Home / Self Care | Payer: Medicare Other | Source: Ambulatory Visit | Attending: Radiation Oncology | Admitting: Radiation Oncology

## 2018-05-01 DIAGNOSIS — Z510 Encounter for antineoplastic radiation therapy: Secondary | ICD-10-CM | POA: Diagnosis not present

## 2018-05-02 ENCOUNTER — Other Ambulatory Visit: Payer: Self-pay

## 2018-05-02 ENCOUNTER — Encounter (HOSPITAL_COMMUNITY): Payer: Medicare Other

## 2018-05-02 ENCOUNTER — Ambulatory Visit: Payer: Medicare Other

## 2018-05-02 ENCOUNTER — Ambulatory Visit
Admission: RE | Admit: 2018-05-02 | Discharge: 2018-05-02 | Disposition: A | Discharge status: Home / Self Care | Payer: Medicare Other | Source: Ambulatory Visit | Attending: Radiation Oncology | Admitting: Radiation Oncology

## 2018-05-02 ENCOUNTER — Telehealth: Payer: Self-pay | Admitting: Internal Medicine

## 2018-05-02 ENCOUNTER — Telehealth: Payer: Self-pay | Admitting: Endocrinology

## 2018-05-02 DIAGNOSIS — Z510 Encounter for antineoplastic radiation therapy: Secondary | ICD-10-CM | POA: Diagnosis not present

## 2018-05-02 NOTE — Telephone Encounter (Signed)
Okay; thanks.

## 2018-05-02 NOTE — Telephone Encounter (Signed)
I called the patient to schedule her AWV.  She said that she sees Reynold Bowen now for her primary care. VDM (DD)

## 2018-05-02 NOTE — Telephone Encounter (Signed)
error 

## 2018-05-03 ENCOUNTER — Ambulatory Visit: Payer: Medicare Other

## 2018-05-03 ENCOUNTER — Ambulatory Visit
Admission: RE | Admit: 2018-05-03 | Discharge: 2018-05-03 | Disposition: A | Discharge status: Home / Self Care | Payer: Medicare Other | Source: Ambulatory Visit | Attending: Radiation Oncology | Admitting: Radiation Oncology

## 2018-05-03 ENCOUNTER — Other Ambulatory Visit: Payer: Self-pay

## 2018-05-03 DIAGNOSIS — Z510 Encounter for antineoplastic radiation therapy: Secondary | ICD-10-CM | POA: Diagnosis not present

## 2018-05-04 ENCOUNTER — Other Ambulatory Visit: Payer: Self-pay

## 2018-05-04 ENCOUNTER — Ambulatory Visit
Admission: RE | Admit: 2018-05-04 | Discharge: 2018-05-04 | Disposition: A | Discharge status: Home / Self Care | Payer: Medicare Other | Source: Ambulatory Visit | Attending: Radiation Oncology | Admitting: Radiation Oncology

## 2018-05-04 ENCOUNTER — Ambulatory Visit: Payer: Medicare Other

## 2018-05-04 DIAGNOSIS — Z510 Encounter for antineoplastic radiation therapy: Secondary | ICD-10-CM | POA: Diagnosis not present

## 2018-05-04 NOTE — Progress Notes (Signed)
[place]   [phone] [phone]   Clinic Follow up Note   Patient Care Team: Reynold Bowen, MD as PCP - General (Endocrinology) Lorretta Harp, MD as PCP - Cardiology (Cardiology)  Date of Service:  05/07/2018  CHIEF COMPLAINT: F/u of Rectal cancer  SUMMARY OF ONCOLOGIC HISTORY: Oncology History   Cancer Staging Rectal cancer [facility]) Staging form: Colon and Rectum, AJCC 8th Edition - Clinical stage from 10/04/2017: Stage IIIB (cT3, cN1, cM0) - Signed by Truitt Merle, MD on 11/18/2017       Rectal cancer ([place])   10/04/2017 Procedure    Colonoscopy per Dr. Collene Mares 10/04/17 shows a large, non-obstructing horse-shoe shaped mass was found in the recto-sigmoid colon at 10 cm; the mass was partially circumferential involving one-half of the lumen circumference. The mass measured seven cm in length    10/04/2017 Initial Biopsy    Rectum, at 10-17 cm, mass biopsy -invasive adenocarcinoma, moderately differentiated  ICH stains for MLH1, MSH2, MSH6, and PMS2 are intact (normal)    10/04/2017 Cancer Staging    Staging form: Colon and Rectum, AJCC 8th Edition - Clinical stage from 10/04/2017: Stage IIIB (cT3, cN1, cM0) - Signed by Truitt Merle, MD on 11/18/2017    10/25/2017 Imaging    CT CAP IMPRESSION: 1. Irregular eccentric mass within the sigmoid colon. There are at least 3 abnormal appearing lymph node adjacent to the sigmoid colon at the level of the mass concerning for the possibility of local nodal metastatic disease. 2. Multiple pulmonary nodules as described above. These are indeterminate in etiology. Recommend attention on follow-up.    11/06/2017 Initial Diagnosis    Adenocarcinoma of colon ([place])    11/16/2017 Imaging    Pelvic MRI 11/16/17  IMPRESSION: Rectal adenocarcinoma T stage:   T3 Rectal adenocarcinoma N stage:  N1 Distance from tumor to the anal sphincter is 6.9 cm.    11/29/2017 - 02/15/2018 Chemotherapy    totalneoadjuvant chemo  FOLFOXq2 weeks x4 months, starting 11/29/17-03/07/18, followed by concurrent chemoRT. Completed 8 cycles of FOLFOX on 03/07/18    01/17/2018 Imaging    CT AP W Contrast 01/17/18 IMPRESSION: 1. Previously seen soft tissue mass involving the left lateral wall the upper rectum is no longer visualized. 2. Decreased size of left perirectal lymph nodes since prior exam. 3. No other sites of metastatic disease within the abdomen or pelvis. 4. Colonic diverticulosis, without radiographic evidence of diverticulitis. 5. Stable small adjacent paraumbilical hernias containing only fat.    04/09/2018 -  Chemotherapy    Concurrent ChemoRT with Xeloda '1500mg'$  BID on days of radiation M-F starting 04/09/18. Held week of 04/30/18 due to dirrhea. Restarted 05/07/18 at reduced dose of '1000mg'$  in the AM and 1000-'1500mg'$  in the PM.   Plan to complete 05/16/18.     04/09/2018 -  Radiation Therapy    concurrent chemoRT with Dr. Lisbeth Renshaw starting 04/09/18. Plan to completed 05/16/18      CURRENT THERAPY:  concurrent chemoRT with Xeloda '1500mg'$  BID on days of radiation M-F started3/30/20 and plan to complete 05/16/18. Xeloda held the week of 04/30/2018 due to severe diarrhea. Restarted at lower dose Xeloda '1000mg'$  in the AM and 1000-'1500mg'$  in the PM. Plan to complete 05/16/18.   INTERVAL HISTORY:  Nicole Sellers is here for a follow up of treatment. She presents to the clinic today by herself.  She notes she is doing well. She notes her hands are numb which makes it difficult to pick and hold objects.  She notes her hands are darker. She notes the same for her feet. She denies any tingling pain.  She notes her diarrhea resolved off Xeloda. She denies rectal pain or surrounding skin peeling from radiation   REVIEW OF SYSTEMS:   Constitutional: Denies fevers, chills or abnormal weight loss Eyes: Denies blurriness of vision Ears, nose, mouth, throat, and face: Denies mucositis or sore throat Respiratory: Denies cough, dyspnea or  wheezes Cardiovascular: Denies palpitation, chest discomfort or lower extremity swelling Gastrointestinal:  Denies nausea, heartburn or change in bowel habits Skin: Denies abnormal skin rashes (+) Skin darkening of hands and feet  Lymphatics: Denies new lymphadenopathy or easy bruising Neurological:Denies tingling or new weaknesses (+) numbness of hands and feet  Behavioral/Psych: Mood is stable, no new changes  All other systems were reviewed with the patient and are negative.  MEDICAL HISTORY:  Past Medical History:  Diagnosis Date  . Allergy   . Anemia   . Arthritis    "joints sometimes" (12/25/2012)  . Clotting disorder ([place])   . Colon cancer ([place])   . Critical lower limb ischemia   . Gangrene of toe, Rt second toe 12/25/2012  . GERD (gastroesophageal reflux disease)   . High cholesterol   . Hypertension   . Hypothyroidism   . PAD (peripheral artery disease) ([place])   . S/P arterial stent, 12/25/13, successful diamondback orbital rotational arthrectomy, PTA using chocolate  balloon and stenting using I DEV stent of long segment calcified high-grade proximal and mid r 12/25/2012  . Tobacco abuse   . Type II diabetes mellitus ([place])     SURGICAL HISTORY: Past Surgical History:  Procedure Laterality Date  . ABDOMINAL AORTAGRAM  12/20/2012   Procedure: ABDOMINAL AORTAGRAM;  Surgeon: Lorretta Harp, MD;  Location: [facility] CATH LAB;  Service: Cardiovascular;;  . ABDOMINAL HYSTERECTOMY  2000  . ANGIOPLASTY / STENTING FEMORAL Right 12/25/2012  . ATHERECTOMY Right 12/25/2012   Procedure: ATHERECTOMY;  Surgeon: Lorretta Harp, MD;  Location: [facility] CATH LAB;  Service: Cardiovascular;  Laterality: Right;  right SFA  . HERNIA REPAIR    . LOWER EXTREMITY ANGIOGRAM N/A 12/20/2012   Procedure: LOWER EXTREMITY ANGIOGRAM;  Surgeon: Lorretta Harp, MD;  Location: [facility] CATH LAB;  Service: Cardiovascular;  Laterality: N/A;  . LOWER EXTREMITY ANGIOGRAM N/A 12/25/2012   Procedure: LOWER EXTREMITY  ANGIOGRAM;  Surgeon: Lorretta Harp, MD;  Location: [facility] CATH LAB;  Service: Cardiovascular;  Laterality: N/A;  . LOWER EXTREMITY INTERVENTION  05/08/2017  . LOWER EXTREMITY INTERVENTION Bilateral 05/08/2017   Procedure: LOWER EXTREMITY INTERVENTION;  Surgeon: Lorretta Harp, MD;  Location: [place] CV LAB;  Service: Cardiovascular;  Laterality: Bilateral;  . PERIPHERAL VASCULAR BALLOON ANGIOPLASTY Right 05/08/2017   Procedure: PERIPHERAL VASCULAR BALLOON ANGIOPLASTY;  Surgeon: Lorretta Harp, MD;  Location: [place] CV LAB;  Service: Cardiovascular;  Laterality: Right;  sfa  . PERIPHERAL VASCULAR INTERVENTION Right 05/08/2017   Procedure: PERIPHERAL VASCULAR INTERVENTION;  Surgeon: Lorretta Harp, MD;  Location: [place] CV LAB;  Service: Cardiovascular;  Laterality: Right;  ext iliac  . PORTACATH PLACEMENT Right 11/22/2017   Procedure: INSERTION PORT-A-CATH;  Surgeon: Leighton Ruff, MD;  Location: [facility] ORS;  Service: General;  Laterality: Right;  . TOE SURGERY Right    2n toe   . UMBILICAL HERNIA REPAIR  2000    I have reviewed the social history and family history with the patient and they are unchanged from previous note.  ALLERGIES:  is allergic to kiwi extract;  lisinopril; and plavix [clopidogrel bisulfate].  MEDICATIONS:  Current Outpatient Medications  Medication Sig Dispense Refill  . acetaminophen (TYLENOL) 500 MG tablet Take 500 mg by mouth 2 (two) times daily as needed for moderate pain or headache.     . albuterol (PROVENTIL HFA;VENTOLIN HFA) 108 (90 BASE) MCG/ACT inhaler Inhale 2 puffs into the lungs every 6 (six) hours as needed for wheezing or shortness of breath.    Marland Kitchen amLODipine (NORVASC) 5 MG tablet TAKE 1 TABLET BY MOUTH EVERY DAY 90 tablet 2  . aspirin 81 MG tablet Take 81 mg by mouth daily.    Marland Kitchen atorvastatin (LIPITOR) 10 MG tablet Take 1 tablet (10 mg total) by mouth daily. 90 tablet 1  . Calcium Carbonate-Vitamin D (CALCIUM 600+D PO) Take 1 tablet by  mouth daily.    . capecitabine (XELODA) 500 MG tablet Take 3 tablets (1,500 mg total) by mouth 2 (two) times daily after a meal. Take on the days of radiation only, Monday through Friday 90 tablet 1  . cetirizine (ZYRTEC) 10 MG tablet Take 10 mg by mouth daily.    . cholecalciferol (VITAMIN D) 1000 units tablet Take 4,000 Units by mouth daily.     . clopidogrel (PLAVIX) 75 MG tablet Take 1 tablet (75 mg total) by mouth daily with breakfast. 90 tablet 3  . diphenoxylate-atropine (LOMOTIL) 2.5-0.025 MG tablet Take 1-2 tablets by mouth 4 (four) times daily as needed for diarrhea or loose stools. 40 tablet 2  . FERREX 150 150 MG capsule Take 150 mg by mouth 2 (two) times daily.  11  . gabapentin (NEURONTIN) 300 MG capsule Take 300 mg by mouth daily.     Marland Kitchen glimepiride (AMARYL) 1 MG tablet Take 1 mg by mouth 2 (two) times daily.    Marland Kitchen levothyroxine (SYNTHROID, LEVOTHROID) 50 MCG tablet Take 1 tablet (50 mcg total) by mouth daily. 90 tablet 1  . lidocaine-prilocaine (EMLA) cream Apply to affected area once 30 g 3  . magic mouthwash w/lidocaine SOLN Take 5 mLs by mouth 3 (three) times daily. Swish and Spit 240 mL 0  . metFORMIN (GLUCOPHAGE-XR) 500 MG 24 hr tablet TAKE 2 TABLETS BY MOUTH  TWICE A DAY 60 tablet 0  . mirtazapine (REMERON) 7.5 MG tablet Take 2 tablets (15 mg total) by mouth at bedtime. 60 tablet 2  . Misc Natural Products (OSTEO BI-FLEX ADV TRIPLE ST) TABS Take 1 tablet by mouth 2 (two) times daily.    . Multiple Vitamin (MULTIVITAMIN) capsule Take 1 capsule by mouth daily.    . ondansetron (ZOFRAN) 8 MG tablet Take 1 tablet (8 mg total) by mouth 2 (two) times daily as needed for refractory nausea / vomiting. Start on day 3 after chemotherapy. 30 tablet 1  . prochlorperazine (COMPAZINE) 10 MG tablet Take 1 tablet (10 mg total) by mouth every 6 (six) hours as needed (Nausea or vomiting). 30 tablet 1  . tetrahydrozoline-zinc (VISINE-AC) 0.05-0.25 % ophthalmic solution Place 1 drop into both eyes  2 (two) times daily as needed (dry eyes).    . Triamcinolone Acetonide (NASACORT ALLERGY 24HR NA) Place 1 spray into the nose daily as needed (allergies).    . triamterene-hydrochlorothiazide (MAXZIDE) 75-50 MG tablet Take 1 tablet by mouth daily.    Loura Pardon Salicylate (ASPERCREME EX) Apply 1 application topically daily as needed (joint pain).    . vitamin B-12 (CYANOCOBALAMIN) 500 MCG tablet Take 500 mcg by mouth daily.     No current facility-administered medications for this  visit.    Facility-Administered Medications Ordered in Other Visits  Medication Dose Route Frequency Provider Last Rate Last Dose  . heparin lock flush 100 unit/mL  500 Units Intracatheter Once PRN Cira Rue K, NP      . sodium chloride flush (NS) 0.9 % injection 10 mL  10 mL Intracatheter Once PRN Alla Feeling, NP        PHYSICAL EXAMINATION: ECOG PERFORMANCE STATUS: 1 - Symptomatic but completely ambulatory  Vitals:   05/07/18 1125  BP: (!) 167/84  Pulse: 80  Resp: 18  Temp: 98.3 F (36.8 C)  SpO2: 100%   Filed Weights   05/07/18 1125  Weight: 153 lb 8 oz (69.6 kg)    GENERAL:alert, no distress and comfortable SKIN: skin color, texture, turgor are normal, no rashes or significant lesions EYES: normal, Conjunctiva are pink and non-injected, sclera clear OROPHARYNX:no exudate, no erythema and lips, buccal mucosa, and tongue normal  NECK: supple, thyroid normal size, non-tender, without nodularity LYMPH:  no palpable lymphadenopathy in the cervical, axillary or inguinal LUNGS: clear to auscultation and percussion with normal breathing effort HEART: regular rate & rhythm and no murmurs and no lower extremity edema ABDOMEN:abdomen soft, non-tender and normal bowel sounds Musculoskeletal:no cyanosis of digits and no clubbing  NEURO: alert & oriented x 3 with fluent speech, no focal motor/sensory deficits  LABORATORY DATA:  I have reviewed the data as listed CBC Latest Ref Rng & Units  05/07/2018 04/30/2018 04/23/2018  WBC 4.0 - 10.5 K/uL 3.5(L) 3.5(L) 2.9(L)  Hemoglobin 12.0 - 15.0 g/dL 9.7(L) 9.8(L) 10.1(L)  Hematocrit 36.0 - 46.0 % 28.9(L) 29.0(L) 31.2(L)  Platelets 150 - 400 K/uL 296 267 202     CMP Latest Ref Rng & Units 05/07/2018 04/30/2018 04/23/2018  Glucose 70 - 99 mg/dL 74 90 132(H)  BUN 8 - 23 mg/dL 9 7(L) 9  Creatinine 0.44 - 1.00 mg/dL 0.81 0.94 0.90  Sodium 135 - 145 mmol/L 140 139 139  Potassium 3.5 - 5.1 mmol/L 3.4(L) 3.4(L) 4.0  Chloride 98 - 111 mmol/L 106 103 103  CO2 22 - 32 mmol/L '23 24 25  '$ Calcium 8.9 - 10.3 mg/dL 8.6(L) 9.2 9.4  Total Protein 6.5 - 8.1 g/dL 6.4(L) 6.7 6.4(L)  Total Bilirubin 0.3 - 1.2 mg/dL 0.3 0.3 0.2(L)  Alkaline Phos 38 - 126 U/L 86 86 86  AST 15 - 41 U/L '22 18 20  '$ ALT 0 - 44 U/L '11 9 9      '$ RADIOGRAPHIC STUDIES: I have personally reviewed the radiological images as listed and agreed with the findings in the report. No results found.   ASSESSMENT & PLAN:  Nicole Sellers is a 68 y.o. female with   1. Adenocarcinoma of the rectosigmoid colon, moderately differentiated,cT3N1M0, stage IIIB,MMR normal -Diagnosed in 09/2017.She is on neoadjuvantchemo. She completed 4 months ofFOLFOXwithUdenyca, planforchemoRTwith Xelodabeforesurgery. -She started concurrent chemoRT with Xeloda'1500mg'$  BIDon 04/09/18.She is tolerating moderately well with mild diarrhea and moderate hand and foot numbness at first but diarrhea progressed.  -Xeloda was held week of 04/30/18 and will restart today 05/07/18, due to her diarrhea, will reduce to '1000mg'$  in the AM and 1000-'1500mg'$  in the PM  -Labs reviewed, CBC WNL except WBC 3.5, Hg 9.7, CMP WNL except K 3.4. Overall adequate to proceed with treatment. She plans to complete treatment on 05/16/18.  -I will refer her back to Dr. Marcello Moores for surgery.  -F/u with Lacie next week and me in 5 weeks   2. Iron deficiency anemia,  secondary to #1 -She is currently on2oral ironpills a day.Will  continue. -She previously declined IV iron. -Hgat9.7today (05/07/18)   3. Depression -Due to her diagnosis and treatment. -Currently on mirtazapine. -Her mood has been stable.  4. Diarrhea -She initially complained of constipation that has changed to diarrheaafter chemo. -Diarrhea has returned due to Xeloda and radiation and now significant.  -I instructed her to take Imodium twice at first sign of diarrhea and then every 3 hours or she can alternate with lomotil and take up to 8 tabs each daily.  -Held Xeloda week of 04/30/18 and diarrhea resolved.  -Will restart Xeloda 05/07/18 at reduced dose of '1000mg'$  in the AM and 1000-'1500mg'$  in the PM.   5. HTN -Due to her severe diarrhea, I checked her orthostatic, which was negative.  She has been drinking fluids adequately. -Continue amlodipine and Maxzide. I recommend her to monitor BP at home. We discussed holding Maxzide if her blood pressure becomes low due to diarrhea -BP elevated lately, at 167/84 today (05/07/18).    Plan -Labs reviewed, restart Xeloda today at '1000mg'$  in the AM and 1000-'1500mg'$  in the PM, continue RT -Refer back to Dr. Marcello Moores -Lab and f/u with NP Lacie next week  -Lab, flush and f/u in 5 weeks     No problem-specific Assessment & Plan notes found for this encounter.   No orders of the defined types were placed in this encounter.  All questions were answered. The patient knows to call the clinic with any problems, questions or concerns. No barriers to learning was detected. I spent 15 minutes counseling the patient face to face. The total time spent in the appointment was 20 minutes and more than 50% was on counseling and review of test results     Truitt Merle, MD 05/07/2018   I, Joslyn Devon, am acting as scribe for Truitt Merle, MD.   I have reviewed the above documentation for accuracy and completeness, and I agree with the above.

## 2018-05-07 ENCOUNTER — Ambulatory Visit: Payer: Medicare Other

## 2018-05-07 ENCOUNTER — Inpatient Hospital Stay (HOSPITAL_BASED_OUTPATIENT_CLINIC_OR_DEPARTMENT_OTHER): Payer: Medicare Other | Admitting: Hematology

## 2018-05-07 ENCOUNTER — Other Ambulatory Visit: Payer: Self-pay

## 2018-05-07 ENCOUNTER — Inpatient Hospital Stay: Payer: Medicare Other

## 2018-05-07 ENCOUNTER — Encounter: Payer: Self-pay | Admitting: Hematology

## 2018-05-07 ENCOUNTER — Telehealth: Payer: Self-pay | Admitting: Hematology

## 2018-05-07 ENCOUNTER — Ambulatory Visit
Admission: RE | Admit: 2018-05-07 | Discharge: 2018-05-07 | Disposition: A | Discharge status: Home / Self Care | Payer: Medicare Other | Source: Ambulatory Visit | Attending: Radiation Oncology | Admitting: Radiation Oncology

## 2018-05-07 VITALS — BP 167/84 | HR 80 | Temp 98.3°F | Resp 18 | Ht 66.0 in | Wt 153.5 lb

## 2018-05-07 DIAGNOSIS — C20 Malignant neoplasm of rectum: Secondary | ICD-10-CM

## 2018-05-07 DIAGNOSIS — F329 Major depressive disorder, single episode, unspecified: Secondary | ICD-10-CM | POA: Diagnosis not present

## 2018-05-07 DIAGNOSIS — R110 Nausea: Secondary | ICD-10-CM

## 2018-05-07 DIAGNOSIS — E7800 Pure hypercholesterolemia, unspecified: Secondary | ICD-10-CM

## 2018-05-07 DIAGNOSIS — D509 Iron deficiency anemia, unspecified: Secondary | ICD-10-CM | POA: Diagnosis not present

## 2018-05-07 DIAGNOSIS — Z510 Encounter for antineoplastic radiation therapy: Secondary | ICD-10-CM | POA: Diagnosis not present

## 2018-05-07 DIAGNOSIS — E039 Hypothyroidism, unspecified: Secondary | ICD-10-CM

## 2018-05-07 DIAGNOSIS — K219 Gastro-esophageal reflux disease without esophagitis: Secondary | ICD-10-CM

## 2018-05-07 DIAGNOSIS — Z9221 Personal history of antineoplastic chemotherapy: Secondary | ICD-10-CM

## 2018-05-07 DIAGNOSIS — M1990 Unspecified osteoarthritis, unspecified site: Secondary | ICD-10-CM

## 2018-05-07 DIAGNOSIS — F17210 Nicotine dependence, cigarettes, uncomplicated: Secondary | ICD-10-CM

## 2018-05-07 DIAGNOSIS — E1151 Type 2 diabetes mellitus with diabetic peripheral angiopathy without gangrene: Secondary | ICD-10-CM

## 2018-05-07 DIAGNOSIS — Z79899 Other long term (current) drug therapy: Secondary | ICD-10-CM

## 2018-05-07 DIAGNOSIS — K5900 Constipation, unspecified: Secondary | ICD-10-CM | POA: Diagnosis not present

## 2018-05-07 DIAGNOSIS — R197 Diarrhea, unspecified: Secondary | ICD-10-CM

## 2018-05-07 DIAGNOSIS — Z923 Personal history of irradiation: Secondary | ICD-10-CM

## 2018-05-07 DIAGNOSIS — Z7984 Long term (current) use of oral hypoglycemic drugs: Secondary | ICD-10-CM

## 2018-05-07 DIAGNOSIS — K5730 Diverticulosis of large intestine without perforation or abscess without bleeding: Secondary | ICD-10-CM

## 2018-05-07 DIAGNOSIS — Z7982 Long term (current) use of aspirin: Secondary | ICD-10-CM

## 2018-05-07 LAB — CBC WITH DIFFERENTIAL (CANCER CENTER ONLY)
Abs Immature Granulocytes: 0.01 10*3/uL (ref 0.00–0.07)
Basophils Absolute: 0.1 10*3/uL (ref 0.0–0.1)
Basophils Relative: 1 %
Eosinophils Absolute: 0.4 10*3/uL (ref 0.0–0.5)
Eosinophils Relative: 11 %
HCT: 28.9 % — ABNORMAL LOW (ref 36.0–46.0)
Hemoglobin: 9.7 g/dL — ABNORMAL LOW (ref 12.0–15.0)
Immature Granulocytes: 0 %
Lymphocytes Relative: 10 %
Lymphs Abs: 0.3 10*3/uL — ABNORMAL LOW (ref 0.7–4.0)
MCH: 32.8 pg (ref 26.0–34.0)
MCHC: 33.6 g/dL (ref 30.0–36.0)
MCV: 97.6 fL (ref 80.0–100.0)
Monocytes Absolute: 0.3 10*3/uL (ref 0.1–1.0)
Monocytes Relative: 10 %
Neutro Abs: 2.4 10*3/uL (ref 1.7–7.7)
Neutrophils Relative %: 68 %
Platelet Count: 296 10*3/uL (ref 150–400)
RBC: 2.96 MIL/uL — ABNORMAL LOW (ref 3.87–5.11)
RDW: 14.2 % (ref 11.5–15.5)
WBC Count: 3.5 10*3/uL — ABNORMAL LOW (ref 4.0–10.5)
nRBC: 0 % (ref 0.0–0.2)

## 2018-05-07 LAB — CMP (CANCER CENTER ONLY)
ALT: 11 U/L (ref 0–44)
AST: 22 U/L (ref 15–41)
Albumin: 3.1 g/dL — ABNORMAL LOW (ref 3.5–5.0)
Alkaline Phosphatase: 86 U/L (ref 38–126)
Anion gap: 11 (ref 5–15)
BUN: 9 mg/dL (ref 8–23)
CO2: 23 mmol/L (ref 22–32)
Calcium: 8.6 mg/dL — ABNORMAL LOW (ref 8.9–10.3)
Chloride: 106 mmol/L (ref 98–111)
Creatinine: 0.81 mg/dL (ref 0.44–1.00)
GFR, Est AFR Am: >60 mL/min (ref >60)
GFR, Estimated: >60 mL/min (ref >60)
Glucose, Bld: 74 mg/dL (ref 70–99)
Potassium: 3.4 mmol/L — ABNORMAL LOW (ref 3.5–5.1)
Sodium: 140 mmol/L (ref 135–145)
Total Bilirubin: 0.3 mg/dL (ref 0.3–1.2)
Total Protein: 6.4 g/dL — ABNORMAL LOW (ref 6.5–8.1)

## 2018-05-07 MED ORDER — SODIUM CHLORIDE 0.9% FLUSH
10.0000 mL | Freq: Once | INTRAVENOUS | Status: AC | PRN
  Administered 2018-05-07: 10 mL
  Filled 2018-05-07: qty 10

## 2018-05-07 MED ORDER — HEPARIN SOD (PORK) LOCK FLUSH 100 UNIT/ML IV SOLN
500.0000 [IU] | Freq: Once | INTRAVENOUS | Status: AC | PRN
  Administered 2018-05-07: 500 [IU]
  Filled 2018-05-07: qty 5

## 2018-05-07 NOTE — Telephone Encounter (Signed)
Scheduled appt per 4/27 los. °

## 2018-05-08 ENCOUNTER — Ambulatory Visit: Payer: Medicare Other

## 2018-05-08 ENCOUNTER — Other Ambulatory Visit: Payer: Self-pay

## 2018-05-08 ENCOUNTER — Inpatient Hospital Stay: Payer: Medicare Other | Admitting: Nutrition

## 2018-05-08 ENCOUNTER — Ambulatory Visit
Admission: RE | Admit: 2018-05-08 | Discharge: 2018-05-08 | Disposition: A | Discharge status: Home / Self Care | Payer: Medicare Other | Source: Ambulatory Visit | Attending: Radiation Oncology | Admitting: Radiation Oncology

## 2018-05-08 DIAGNOSIS — Z510 Encounter for antineoplastic radiation therapy: Secondary | ICD-10-CM | POA: Diagnosis not present

## 2018-05-08 NOTE — Progress Notes (Signed)
RD working remotely.  Attempted to contact patient by phone again. She did not answer.  I left a message to contact me if she has any needs. No follow up scheduled. She has my contact information.

## 2018-05-09 ENCOUNTER — Other Ambulatory Visit: Payer: Self-pay

## 2018-05-09 ENCOUNTER — Ambulatory Visit
Admission: RE | Admit: 2018-05-09 | Discharge: 2018-05-09 | Disposition: A | Discharge status: Home / Self Care | Payer: Medicare Other | Source: Ambulatory Visit | Attending: Radiation Oncology | Admitting: Radiation Oncology

## 2018-05-09 ENCOUNTER — Ambulatory Visit: Payer: Medicare Other

## 2018-05-09 DIAGNOSIS — Z510 Encounter for antineoplastic radiation therapy: Secondary | ICD-10-CM | POA: Diagnosis not present

## 2018-05-10 ENCOUNTER — Ambulatory Visit: Payer: Medicare Other

## 2018-05-11 ENCOUNTER — Ambulatory Visit
Admission: RE | Admit: 2018-05-11 | Discharge: 2018-05-11 | Disposition: A | Discharge status: Home / Self Care | Payer: Medicare Other | Source: Ambulatory Visit | Attending: Radiation Oncology | Admitting: Radiation Oncology

## 2018-05-11 ENCOUNTER — Other Ambulatory Visit: Payer: Self-pay

## 2018-05-11 DIAGNOSIS — Z510 Encounter for antineoplastic radiation therapy: Secondary | ICD-10-CM | POA: Insufficient documentation

## 2018-05-11 DIAGNOSIS — C20 Malignant neoplasm of rectum: Secondary | ICD-10-CM | POA: Diagnosis not present

## 2018-05-14 ENCOUNTER — Inpatient Hospital Stay: Payer: Medicare Other | Attending: Nurse Practitioner

## 2018-05-14 ENCOUNTER — Encounter: Payer: Self-pay | Admitting: Nurse Practitioner

## 2018-05-14 ENCOUNTER — Other Ambulatory Visit: Payer: Self-pay

## 2018-05-14 ENCOUNTER — Ambulatory Visit: Payer: Medicare Other

## 2018-05-14 ENCOUNTER — Ambulatory Visit
Admission: RE | Admit: 2018-05-14 | Discharge: 2018-05-14 | Disposition: A | Discharge status: Home / Self Care | Payer: Medicare Other | Source: Ambulatory Visit | Attending: Radiation Oncology | Admitting: Radiation Oncology

## 2018-05-14 ENCOUNTER — Inpatient Hospital Stay: Payer: Medicare Other

## 2018-05-14 ENCOUNTER — Inpatient Hospital Stay (HOSPITAL_BASED_OUTPATIENT_CLINIC_OR_DEPARTMENT_OTHER): Payer: Medicare Other | Admitting: Nurse Practitioner

## 2018-05-14 VITALS — BP 138/60 | HR 82 | Temp 99.7°F | Resp 18 | Ht 66.0 in | Wt 153.3 lb

## 2018-05-14 DIAGNOSIS — Z79899 Other long term (current) drug therapy: Secondary | ICD-10-CM

## 2018-05-14 DIAGNOSIS — F17210 Nicotine dependence, cigarettes, uncomplicated: Secondary | ICD-10-CM | POA: Insufficient documentation

## 2018-05-14 DIAGNOSIS — E039 Hypothyroidism, unspecified: Secondary | ICD-10-CM

## 2018-05-14 DIAGNOSIS — E1151 Type 2 diabetes mellitus with diabetic peripheral angiopathy without gangrene: Secondary | ICD-10-CM | POA: Insufficient documentation

## 2018-05-14 DIAGNOSIS — R197 Diarrhea, unspecified: Secondary | ICD-10-CM

## 2018-05-14 DIAGNOSIS — Z923 Personal history of irradiation: Secondary | ICD-10-CM | POA: Diagnosis not present

## 2018-05-14 DIAGNOSIS — K219 Gastro-esophageal reflux disease without esophagitis: Secondary | ICD-10-CM | POA: Insufficient documentation

## 2018-05-14 DIAGNOSIS — I10 Essential (primary) hypertension: Secondary | ICD-10-CM | POA: Diagnosis not present

## 2018-05-14 DIAGNOSIS — R5383 Other fatigue: Secondary | ICD-10-CM | POA: Diagnosis not present

## 2018-05-14 DIAGNOSIS — F329 Major depressive disorder, single episode, unspecified: Secondary | ICD-10-CM

## 2018-05-14 DIAGNOSIS — D509 Iron deficiency anemia, unspecified: Secondary | ICD-10-CM

## 2018-05-14 DIAGNOSIS — C20 Malignant neoplasm of rectum: Secondary | ICD-10-CM

## 2018-05-14 DIAGNOSIS — E7800 Pure hypercholesterolemia, unspecified: Secondary | ICD-10-CM | POA: Diagnosis not present

## 2018-05-14 DIAGNOSIS — Z9221 Personal history of antineoplastic chemotherapy: Secondary | ICD-10-CM

## 2018-05-14 DIAGNOSIS — Z7984 Long term (current) use of oral hypoglycemic drugs: Secondary | ICD-10-CM | POA: Insufficient documentation

## 2018-05-14 DIAGNOSIS — R200 Anesthesia of skin: Secondary | ICD-10-CM | POA: Diagnosis not present

## 2018-05-14 DIAGNOSIS — Z7982 Long term (current) use of aspirin: Secondary | ICD-10-CM | POA: Diagnosis not present

## 2018-05-14 DIAGNOSIS — Z510 Encounter for antineoplastic radiation therapy: Secondary | ICD-10-CM | POA: Diagnosis not present

## 2018-05-14 DIAGNOSIS — M1990 Unspecified osteoarthritis, unspecified site: Secondary | ICD-10-CM

## 2018-05-14 DIAGNOSIS — C189 Malignant neoplasm of colon, unspecified: Secondary | ICD-10-CM

## 2018-05-14 LAB — CBC WITH DIFFERENTIAL (CANCER CENTER ONLY)
Abs Immature Granulocytes: 0.01 10*3/uL (ref 0.00–0.07)
Basophils Absolute: 0 10*3/uL (ref 0.0–0.1)
Basophils Relative: 0 %
Eosinophils Absolute: 0.3 10*3/uL (ref 0.0–0.5)
Eosinophils Relative: 10 %
HCT: 30.4 % — ABNORMAL LOW (ref 36.0–46.0)
Hemoglobin: 10.1 g/dL — ABNORMAL LOW (ref 12.0–15.0)
Immature Granulocytes: 0 %
Lymphocytes Relative: 9 %
Lymphs Abs: 0.3 10*3/uL — ABNORMAL LOW (ref 0.7–4.0)
MCH: 32.9 pg (ref 26.0–34.0)
MCHC: 33.2 g/dL (ref 30.0–36.0)
MCV: 99 fL (ref 80.0–100.0)
Monocytes Absolute: 0.2 10*3/uL (ref 0.1–1.0)
Monocytes Relative: 8 %
Neutro Abs: 2 10*3/uL (ref 1.7–7.7)
Neutrophils Relative %: 73 %
Platelet Count: 295 10*3/uL (ref 150–400)
RBC: 3.07 MIL/uL — ABNORMAL LOW (ref 3.87–5.11)
RDW: 14.1 % (ref 11.5–15.5)
WBC Count: 2.7 10*3/uL — ABNORMAL LOW (ref 4.0–10.5)
nRBC: 0 % (ref 0.0–0.2)

## 2018-05-14 LAB — CMP (CANCER CENTER ONLY)
ALT: 9 U/L (ref 0–44)
AST: 20 U/L (ref 15–41)
Albumin: 3.1 g/dL — ABNORMAL LOW (ref 3.5–5.0)
Alkaline Phosphatase: 82 U/L (ref 38–126)
Anion gap: 9 (ref 5–15)
BUN: 11 mg/dL (ref 8–23)
CO2: 26 mmol/L (ref 22–32)
Calcium: 8.6 mg/dL — ABNORMAL LOW (ref 8.9–10.3)
Chloride: 105 mmol/L (ref 98–111)
Creatinine: 0.85 mg/dL (ref 0.44–1.00)
GFR, Est AFR Am: >60 mL/min (ref >60)
GFR, Estimated: >60 mL/min (ref >60)
Glucose, Bld: 81 mg/dL (ref 70–99)
Potassium: 3.2 mmol/L — ABNORMAL LOW (ref 3.5–5.1)
Sodium: 140 mmol/L (ref 135–145)
Total Bilirubin: 0.3 mg/dL (ref 0.3–1.2)
Total Protein: 6.4 g/dL — ABNORMAL LOW (ref 6.5–8.1)

## 2018-05-14 LAB — CEA (IN HOUSE-CHCC): CEA (CHCC-In House): 4.76 ng/mL (ref 0.00–5.00)

## 2018-05-14 LAB — IRON AND TIBC
Iron: 58 ug/dL (ref 41–142)
Saturation Ratios: 26 % (ref 21–57)
TIBC: 225 ug/dL — ABNORMAL LOW (ref 236–444)
UIBC: 168 ug/dL (ref 120–384)

## 2018-05-14 LAB — FERRITIN: Ferritin: 171 ng/mL (ref 11–307)

## 2018-05-14 NOTE — Patient Instructions (Signed)

## 2018-05-14 NOTE — Progress Notes (Signed)
[facility]   [phone] [phone]   Clinic Follow up Note   Patient Care Team: Reynold Bowen, MD as PCP - General (Endocrinology) Lorretta Harp, MD as PCP - Cardiology (Cardiology) 05/14/2018  CHIEF COMPLAINT: follow up rectal cancer   SUMMARY OF ONCOLOGIC HISTORY: Oncology History   Cancer Staging Rectal cancer [facility]) Staging form: Colon and Rectum, AJCC 8th Edition - Clinical stage from 10/04/2017: Stage IIIB (cT3, cN1, cM0) - Signed by Truitt Merle, MD on 11/18/2017       Rectal cancer ([place])   10/04/2017 Procedure    Colonoscopy per Dr. Collene Mares 10/04/17 shows a large, non-obstructing horse-shoe shaped mass was found in the recto-sigmoid colon at 10 cm; the mass was partially circumferential involving one-half of the lumen circumference. The mass measured seven cm in length    10/04/2017 Initial Biopsy    Rectum, at 10-17 cm, mass biopsy -invasive adenocarcinoma, moderately differentiated  ICH stains for MLH1, MSH2, MSH6, and PMS2 are intact (normal)    10/04/2017 Cancer Staging    Staging form: Colon and Rectum, AJCC 8th Edition - Clinical stage from 10/04/2017: Stage IIIB (cT3, cN1, cM0) - Signed by Truitt Merle, MD on 11/18/2017    10/25/2017 Imaging    CT CAP IMPRESSION: 1. Irregular eccentric mass within the sigmoid colon. There are at least 3 abnormal appearing lymph node adjacent to the sigmoid colon at the level of the mass concerning for the possibility of local nodal metastatic disease. 2. Multiple pulmonary nodules as described above. These are indeterminate in etiology. Recommend attention on follow-up.    11/06/2017 Initial Diagnosis    Adenocarcinoma of colon ([place])    11/16/2017 Imaging    Pelvic MRI 11/16/17  IMPRESSION: Rectal adenocarcinoma T stage:   T3 Rectal adenocarcinoma N stage:  N1 Distance from tumor to the anal sphincter is 6.9 cm.    11/29/2017 - 02/15/2018 Chemotherapy    totalneoadjuvant chemo FOLFOXq2 weeks x4  months, starting 11/29/17-03/07/18, followed by concurrent chemoRT. Completed 8 cycles of FOLFOX on 03/07/18    01/17/2018 Imaging    CT AP W Contrast 01/17/18 IMPRESSION: 1. Previously seen soft tissue mass involving the left lateral wall the upper rectum is no longer visualized. 2. Decreased size of left perirectal lymph nodes since prior exam. 3. No other sites of metastatic disease within the abdomen or pelvis. 4. Colonic diverticulosis, without radiographic evidence of diverticulitis. 5. Stable small adjacent paraumbilical hernias containing only fat.    04/09/2018 -  Chemotherapy    Concurrent ChemoRT with Xeloda 1536m BID on days of radiation M-F starting 04/09/18. Held week of 04/30/18 due to dirrhea. Restarted 05/07/18 at reduced dose of 10070min the AM and 1000-150050mn the PM.   Plan to complete 05/16/18.     04/09/2018 -  Radiation Therapy    concurrent chemoRT with Dr. MooLisbeth Renshawarting 04/09/18. Plan to completed 05/16/18     CURRENT THERAPY: concurrent chemoRT with Xeloda, plan to complete 05/17/18  INTERVAL HISTORY: Ms. ParSerenoturns for follow-up as scheduled.  She continues chemo RT with Xeloda 1000 mg a.m. and 1000 mg p.m M-F.  Plans to complete radiation on 5/7 this week.  Overall she feels well but "some days are tough."  She has mild fatigue but remains able to function well.  Eating and drinking well.  Denies mucositis.  Denies nausea/vomiting.  Her diarrhea is "moderate" she alternates Imodium and Lomotil.  Frequency varies, she has had one episode of diarrhea today.  She  takes 3-4 Lomotil and 1-2 doses of Imodium daily depending on amount of stool.  Denies rectal pain, bleeding, or discharge.  No skin breakdown.  Denies dysuria.  Hands are dry, dark and sensitive.  Feet are slightly pink. No blisters.  She has numbness to hands and feet.  Infected toe on left foot is slowly healing.  She is in a boot.  Denies fever, cough, chest pain, dyspnea.  REVIEW OF SYSTEMS:    Constitutional: Denies fevers, chills or abnormal weight loss (+) fatigue Eyes: Denies blurriness of vision Ears, nose, mouth, throat, and face: Denies mucositis or sore throat Respiratory: Denies cough, dyspnea or wheezes Cardiovascular: Denies palpitation, chest discomfort (+) mild lower extremity swelling Gastrointestinal:  Denies nausea, vomiting, constipation, heartburn, rectal bleeding or change in bowel habits (+) diarrhea, moderate Skin: Denies abnormal skin rashes or breakdown (+) darkening of palms Lymphatics: Denies new lymphadenopathy or easy bruising Neurological:Denies numbness, tingling or new weaknesses Behavioral/Psych: Mood is stable, no new changes  All other systems were reviewed with the patient and are negative.  MEDICAL HISTORY:  Past Medical History:  Diagnosis Date  . Allergy   . Anemia   . Arthritis    "joints sometimes" (12/25/2012)  . Clotting disorder ([place])   . Colon cancer ([place])   . Critical lower limb ischemia   . Gangrene of toe, Rt second toe 12/25/2012  . GERD (gastroesophageal reflux disease)   . High cholesterol   . Hypertension   . Hypothyroidism   . PAD (peripheral artery disease) ([place])   . S/P arterial stent, 12/25/13, successful diamondback orbital rotational arthrectomy, PTA using chocolate  balloon and stenting using I DEV stent of long segment calcified high-grade proximal and mid r 12/25/2012  . Tobacco abuse   . Type II diabetes mellitus ([place])     SURGICAL HISTORY: Past Surgical History:  Procedure Laterality Date  . ABDOMINAL AORTAGRAM  12/20/2012   Procedure: ABDOMINAL AORTAGRAM;  Surgeon: Lorretta Harp, MD;  Location: [facility] CATH LAB;  Service: Cardiovascular;;  . ABDOMINAL HYSTERECTOMY  2000  . ANGIOPLASTY / STENTING FEMORAL Right 12/25/2012  . ATHERECTOMY Right 12/25/2012   Procedure: ATHERECTOMY;  Surgeon: Lorretta Harp, MD;  Location: [facility] CATH LAB;  Service: Cardiovascular;  Laterality: Right;  right SFA  . HERNIA REPAIR     . LOWER EXTREMITY ANGIOGRAM N/A 12/20/2012   Procedure: LOWER EXTREMITY ANGIOGRAM;  Surgeon: Lorretta Harp, MD;  Location: [facility] CATH LAB;  Service: Cardiovascular;  Laterality: N/A;  . LOWER EXTREMITY ANGIOGRAM N/A 12/25/2012   Procedure: LOWER EXTREMITY ANGIOGRAM;  Surgeon: Lorretta Harp, MD;  Location: [facility] CATH LAB;  Service: Cardiovascular;  Laterality: N/A;  . LOWER EXTREMITY INTERVENTION  05/08/2017  . LOWER EXTREMITY INTERVENTION Bilateral 05/08/2017   Procedure: LOWER EXTREMITY INTERVENTION;  Surgeon: Lorretta Harp, MD;  Location: [place] CV LAB;  Service: Cardiovascular;  Laterality: Bilateral;  . PERIPHERAL VASCULAR BALLOON ANGIOPLASTY Right 05/08/2017   Procedure: PERIPHERAL VASCULAR BALLOON ANGIOPLASTY;  Surgeon: Lorretta Harp, MD;  Location: [place] CV LAB;  Service: Cardiovascular;  Laterality: Right;  sfa  . PERIPHERAL VASCULAR INTERVENTION Right 05/08/2017   Procedure: PERIPHERAL VASCULAR INTERVENTION;  Surgeon: Lorretta Harp, MD;  Location: [place] CV LAB;  Service: Cardiovascular;  Laterality: Right;  ext iliac  . PORTACATH PLACEMENT Right 11/22/2017   Procedure: INSERTION PORT-A-CATH;  Surgeon: Leighton Ruff, MD;  Location: [facility] ORS;  Service: General;  Laterality: Right;  . TOE SURGERY Right    2n toe   .  UMBILICAL HERNIA REPAIR  2000    I have reviewed the social history and family history with the patient and they are unchanged from previous note.  ALLERGIES:  is allergic to kiwi extract; lisinopril; and plavix [clopidogrel bisulfate].  MEDICATIONS:  Current Outpatient Medications  Medication Sig Dispense Refill  . acetaminophen (TYLENOL) 500 MG tablet Take 500 mg by mouth 2 (two) times daily as needed for moderate pain or headache.     . albuterol (PROVENTIL HFA;VENTOLIN HFA) 108 (90 BASE) MCG/ACT inhaler Inhale 2 puffs into the lungs every 6 (six) hours as needed for wheezing or shortness of breath.    Marland Kitchen amLODipine (NORVASC) 5 MG tablet  TAKE 1 TABLET BY MOUTH EVERY DAY 90 tablet 2  . aspirin 81 MG tablet Take 81 mg by mouth daily.    Marland Kitchen atorvastatin (LIPITOR) 10 MG tablet Take 1 tablet (10 mg total) by mouth daily. 90 tablet 1  . Calcium Carbonate-Vitamin D (CALCIUM 600+D PO) Take 1 tablet by mouth daily.    . capecitabine (XELODA) 500 MG tablet Take 3 tablets (1,500 mg total) by mouth 2 (two) times daily after a meal. Take on the days of radiation only, Monday through Friday 90 tablet 1  . cetirizine (ZYRTEC) 10 MG tablet Take 10 mg by mouth daily.    . cholecalciferol (VITAMIN D) 1000 units tablet Take 4,000 Units by mouth daily.     . diphenoxylate-atropine (LOMOTIL) 2.5-0.025 MG tablet Take 1-2 tablets by mouth 4 (four) times daily as needed for diarrhea or loose stools. 40 tablet 2  . FERREX 150 150 MG capsule Take 150 mg by mouth 2 (two) times daily.  11  . gabapentin (NEURONTIN) 300 MG capsule Take 300 mg by mouth daily.     Marland Kitchen glimepiride (AMARYL) 1 MG tablet Take 1 mg by mouth 2 (two) times daily.    Marland Kitchen levothyroxine (SYNTHROID, LEVOTHROID) 50 MCG tablet Take 1 tablet (50 mcg total) by mouth daily. 90 tablet 1  . lidocaine-prilocaine (EMLA) cream Apply to affected area once 30 g 3  . magic mouthwash w/lidocaine SOLN Take 5 mLs by mouth 3 (three) times daily. Swish and Spit 240 mL 0  . metFORMIN (GLUCOPHAGE-XR) 500 MG 24 hr tablet TAKE 2 TABLETS BY MOUTH  TWICE A DAY 60 tablet 0  . mirtazapine (REMERON) 7.5 MG tablet Take 2 tablets (15 mg total) by mouth at bedtime. 60 tablet 2  . Misc Natural Products (OSTEO BI-FLEX ADV TRIPLE ST) TABS Take 1 tablet by mouth 2 (two) times daily.    . Multiple Vitamin (MULTIVITAMIN) capsule Take 1 capsule by mouth daily.    . ondansetron (ZOFRAN) 8 MG tablet Take 1 tablet (8 mg total) by mouth 2 (two) times daily as needed for refractory nausea / vomiting. Start on day 3 after chemotherapy. 30 tablet 1  . prochlorperazine (COMPAZINE) 10 MG tablet Take 1 tablet (10 mg total) by mouth every 6  (six) hours as needed (Nausea or vomiting). 30 tablet 1  . tetrahydrozoline-zinc (VISINE-AC) 0.05-0.25 % ophthalmic solution Place 1 drop into both eyes 2 (two) times daily as needed (dry eyes).    . Triamcinolone Acetonide (NASACORT ALLERGY 24HR NA) Place 1 spray into the nose daily as needed (allergies).    . triamterene-hydrochlorothiazide (MAXZIDE) 75-50 MG tablet Take 1 tablet by mouth daily.    Loura Pardon Salicylate (ASPERCREME EX) Apply 1 application topically daily as needed (joint pain).    . vitamin B-12 (CYANOCOBALAMIN) 500 MCG tablet Take 500  mcg by mouth daily.    . clopidogrel (PLAVIX) 75 MG tablet Take 1 tablet (75 mg total) by mouth daily with breakfast. 90 tablet 3   No current facility-administered medications for this visit.    Facility-Administered Medications Ordered in Other Visits  Medication Dose Route Frequency Provider Last Rate Last Dose  . heparin lock flush 100 unit/mL  500 Units Intracatheter Once PRN Cira Rue K, NP      . sodium chloride flush (NS) 0.9 % injection 10 mL  10 mL Intracatheter Once PRN Alla Feeling, NP        PHYSICAL EXAMINATION: ECOG PERFORMANCE STATUS: 1 - Symptomatic but completely ambulatory  Vitals:   05/14/18 1017  BP: 138/60  Pulse: 82  Resp: 18  Temp: 99.7 F (37.6 C)  SpO2: 100%   Filed Weights   05/14/18 1017  Weight: 153 lb 4.8 oz (69.5 kg)    GENERAL:alert, no distress and comfortable SKIN: mild palmar and plantar erythema. No skin breakdown  EYES: sclera clear OROPHARYNX:no thrush or ulcers  LUNGS: normal breathing effort HEART: trace bilateral lower extremity edema Musculoskeletal: no cyanosis of digits and no clubbing. Dressing noted to left foot  NEURO: alert & oriented x 3 with fluent speech  LABORATORY DATA:  I have reviewed the data as listed CBC Latest Ref Rng & Units 05/14/2018 05/07/2018 04/30/2018  WBC 4.0 - 10.5 K/uL 2.7(L) 3.5(L) 3.5(L)  Hemoglobin 12.0 - 15.0 g/dL 10.1(L) 9.7(L) 9.8(L)   Hematocrit 36.0 - 46.0 % 30.4(L) 28.9(L) 29.0(L)  Platelets 150 - 400 K/uL 295 296 267     CMP Latest Ref Rng & Units 05/14/2018 05/07/2018 04/30/2018  Glucose 70 - 99 mg/dL 81 74 90  BUN 8 - 23 mg/dL 11 9 7(L)  Creatinine 0.44 - 1.00 mg/dL 0.85 0.81 0.94  Sodium 135 - 145 mmol/L 140 140 139  Potassium 3.5 - 5.1 mmol/L 3.2(L) 3.4(L) 3.4(L)  Chloride 98 - 111 mmol/L 105 106 103  CO2 22 - 32 mmol/L _0 Calcium 8.9 - 10.3 mg/dL 8.6(L) 8.6(L) 9.2  Total Protein 6.5 - 8.1 g/dL 6.4(L) 6.4(L) 6.7  Total Bilirubin 0.3 - 1.2 mg/dL 0.3 0.3 0.3  Alkaline Phos 38 - 126 U/L 82 86 86  AST 15 - 41 U/L _1 ALT 0 - 44 U/L _2 RADIOGRAPHIC STUDIES: I have personally reviewed the radiological images as listed and agreed with the findings in the report. No results found.   ASSESSMENT & PLAN: KIANI WURTZEL is a 68 y.o. female with   1. Adenocarcinoma of the rectosigmoid colon, moderately differentiated,cT3N1M0, stage IIIB,MMR normal -S/p 4 months ofneoadjuvantFOLFOXwithUdenyca -She started concurrent chemoRT with Xeloda1559m BIDon 04/09/18.She is tolerating moderately well with mild to moderate diarrhea and moderate hand and foot syndrome . -I encouraged her to use topical hydrocortisone cream to hands/feet 2-3 times per day PRN for hand/foot redness   -Xeloda was held week of 04/30/18 - 05/07/18 due to severe diarrhea  -Ms. Arshad appears stable. She continues chemoRT with Xeloda 1000 mg BID; she will complete on 05/17/18.  -labs reviewed, CBC and CMP stable and overall adequate to continue chemoRT. CEA now in normal range  -K 3.2, due to diarrhea. I recommend she increase anti-diarrhea medication and increase K-rich foods in her diet. She agrees. I printed material for her today. -She has been referred back to Dr. TMarcello Mooresfor surgery, but she does not have appt scheduled yet.  -  she will return in 1 month for f/u with Dr. Burr Medico   2. Iron deficiency anemia, secondary to  #1 -on iron po 1 tab BID, iron studies are normal -Continue   3. Depression -on mirtazapine -stable   4. Diarrhea -she developed diarrhea and Xeloda was held 4/20 - 05/07/18; diarrhea improves off chemo -she has mild to moderate diarrhea, and alternates imodium and lomotil -she can maximize doses, I encouraged her to do so on days with more stool output. And remain adequately hydrated.   5.HTN - On amlodipine andMaxzide. I -BP 138/60 today, stable   Plan -labs reviewed, adequate to continue chemoRT, complete 05/17/18 -Continue Xeloda 1000 mg BID until completion of RT -continue oral iron BID -hydrocortisone to hands and feet 2-3 times per day PRN -reviewed symptom management for diarrhea -Lab, F/u with Dr. Burr Medico in 1 month -F/u with Dr. Marcello Moores pending  -increase K-rich foods in diet   All questions were answered. The patient knows to call the clinic with any problems, questions or concerns. No barriers to learning was detected.     Alla Feeling, NP 05/14/18

## 2018-05-15 ENCOUNTER — Telehealth: Payer: Self-pay | Admitting: Nurse Practitioner

## 2018-05-15 ENCOUNTER — Ambulatory Visit
Admission: RE | Admit: 2018-05-15 | Discharge: 2018-05-15 | Disposition: A | Discharge status: Home / Self Care | Payer: Medicare Other | Source: Ambulatory Visit | Attending: Radiation Oncology | Admitting: Radiation Oncology

## 2018-05-15 ENCOUNTER — Ambulatory Visit: Payer: Medicare Other

## 2018-05-15 ENCOUNTER — Other Ambulatory Visit: Payer: Self-pay

## 2018-05-15 DIAGNOSIS — Z510 Encounter for antineoplastic radiation therapy: Secondary | ICD-10-CM | POA: Diagnosis not present

## 2018-05-15 NOTE — Telephone Encounter (Signed)
No los per 5/4. °

## 2018-05-16 ENCOUNTER — Other Ambulatory Visit: Payer: Self-pay

## 2018-05-16 ENCOUNTER — Telehealth: Payer: Self-pay

## 2018-05-16 ENCOUNTER — Ambulatory Visit
Admission: RE | Admit: 2018-05-16 | Discharge: 2018-05-16 | Disposition: A | Discharge status: Home / Self Care | Payer: Medicare Other | Source: Ambulatory Visit | Attending: Radiation Oncology | Admitting: Radiation Oncology

## 2018-05-16 ENCOUNTER — Ambulatory Visit: Payer: Medicare Other

## 2018-05-16 DIAGNOSIS — Z510 Encounter for antineoplastic radiation therapy: Secondary | ICD-10-CM | POA: Diagnosis not present

## 2018-05-16 NOTE — Telephone Encounter (Signed)
-----   Message from Alla Feeling, NP sent at 05/14/2018  2:05 PM EDT ----- Santiago Glad,  Can you please call [facility] [place] [facility] and see if she has an appointment to see Dr. Marcello Moores soon about her surgery? If not I will place referral to see her. She is completing neoadjuvant chemo this week.  Thanks, Regan Rakers

## 2018-05-16 NOTE — Telephone Encounter (Signed)
TC from Oretta from [facility] in reference to Nicole Sellers she stated that Nicole Sellers was not scheduled with Dr. Marcello Moores until after her neoadjuvant treatments. Informed Nicole Sellers that Nicole Sellers will be finished her treatment this week. She stated Nicole Sellers Dr. Marcello Moores Nurse will get this information and will schedule the appointment for Nicole Sellers

## 2018-05-17 ENCOUNTER — Telehealth: Payer: Self-pay

## 2018-05-17 ENCOUNTER — Encounter: Payer: Self-pay | Admitting: Radiation Oncology

## 2018-05-17 ENCOUNTER — Other Ambulatory Visit: Payer: Self-pay | Admitting: Radiation Oncology

## 2018-05-17 ENCOUNTER — Ambulatory Visit
Admission: RE | Admit: 2018-05-17 | Discharge: 2018-05-17 | Disposition: A | Discharge status: Home / Self Care | Payer: Medicare Other | Source: Ambulatory Visit | Attending: Radiation Oncology | Admitting: Radiation Oncology

## 2018-05-17 ENCOUNTER — Other Ambulatory Visit: Payer: Self-pay

## 2018-05-17 DIAGNOSIS — Z510 Encounter for antineoplastic radiation therapy: Secondary | ICD-10-CM | POA: Diagnosis not present

## 2018-05-17 DIAGNOSIS — C20 Malignant neoplasm of rectum: Secondary | ICD-10-CM

## 2018-05-17 MED ORDER — DIPHENOXYLATE-ATROPINE 2.5-0.025 MG PO TABS: 1.0000 | ORAL_TABLET | Freq: Four times a day (QID) | ORAL | 0 refills | Status: DC | PRN

## 2018-05-17 NOTE — Telephone Encounter (Signed)
TC per Lacie to let her know iron studies and CEA are normal. Continue iron BID. Pt verbalized understanding. No further problems or concerns at this time.

## 2018-05-24 ENCOUNTER — Ambulatory Visit: Payer: Self-pay | Admitting: General Surgery

## 2018-05-24 NOTE — H&P (Signed)
History of Present Illness Nicole Ruff MD; 7/67/3419 2:31 PM) The patient is a 68 year old female who presents with colorectal cancer. 68 year old female with rectal bleeding who presents to the office with a newly diagnosed rectal cancer. She underwent a Cologuard test in July 2019 which was positive. She underwent a colonoscopy with Dr. Collene Mares in September 2019 which showed a rectosigmoid mass. It does not appear that it was tattooed. This showed a moderately differentiated adenocarcinoma. CT scans of the chest abdomen and pelvis were performed. These showed some small lung nodules and a mass that appears to be in her proximal rectum. There is some pericolonic lymph nodes noted as well. CEA level was elevated at 55. She reports no further rectal bleeding. She is having some diarrhea. She denies any weight loss. Surgical history is significant for hysterectomy and umbilical hernia repair. Patient has coronary artery disease but has stopped her Plavix. Her cardiologist is Dr. Alvester Chou. She is a former smoker and has a peripheral artery stent in her leg.  She completed her radiation treatment in early May 2020. She tolerated this well but did have some difficulties with her chemotherapy regimen.   Problem List/Past Medical Nicole Ruff, MD; 3/79/0240 2:31 PM) RECTAL CANCER ([facility])  Past Surgical History Nicole Ruff, MD; 9/73/5329 2:31 PM) Hysterectomy (not due to cancer) - Complete  Diagnostic Studies History Nicole Ruff, MD; 10/03/2681 2:31 PM) Colonoscopy within last year Pap Smear 1-5 years ago  Allergies Sabino Gasser, [place]; 05/24/2018 2:02 PM) Lisinopril *ANTIHYPERTENSIVES* Allergies Reconciled  Medication History Nicole Ruff, MD; 04/28/6220 2:31 PM) Glimepiride (1MG  Tablet, Oral) Active. Levothyroxine Sodium (50MCG Tablet, Oral) Active. metFORMIN HCl (500MG  Tablet, Oral) Active. Triamterene-HCTZ (75-50MG  Tablet, Oral) Active. amLODIPine Besylate (5MG   Tablet, Oral) Active. Atorvastatin Calcium (10MG  Tablet, Oral) Active. Clopidogrel Bisulfate (75MG  Tablet, Oral) Active. Gabapentin (300MG  Capsule, Oral) Active. Medications Reconciled Neomycin Sulfate (500MG  Tablet, 2 (two) Oral SEE NOTE, Taken starting 05/24/2018) Active. (TAKE TWO TABLETS AT 2 PM, 3 PM, AND 10 PM THE DAY PRIOR TO SURGERY) Flagyl (500MG  Tablet, 2 (two) Oral SEE NOTE, Taken starting 05/24/2018) Active. (Take at 2pm, 3pm, and 10pm the day prior to your colon operation)  Social History Nicole Ruff, MD; 9/79/8921 2:31 PM) Caffeine use Coffee. No drug use Tobacco use Former smoker.  Family History Nicole Ruff, MD; 1/94/1740 2:31 PM) Cancer Father. Hypertension Brother, Mother.  Pregnancy / Birth History Nicole Ruff, MD; 08/23/4816 2:31 PM) Age at menarche 9 years. Age of menopause 37-50 Length (months) of breastfeeding 3-6 Maternal age 20-20 Para 1  Other Problems Nicole Ruff, MD; 5/63/1497 2:31 PM) Arthritis Back Pain Depression Diabetes Mellitus Gastroesophageal Reflux Disease High blood pressure Hypercholesterolemia Oophorectomy Bilateral. Rectal Cancer Thyroid Disease Vascular Disease     Review of Systems Nicole Ruff MD; 0/26/3785 2:31 PM) General Present- Appetite Loss and Fatigue. Not Present- Chills, Fever, Night Sweats, Weight Gain and Weight Loss. HEENT Present- Visual Disturbances and Wears glasses/contact lenses. Not Present- Earache, Hearing Loss, Hoarseness, Nose Bleed, Oral Ulcers, Ringing in the Ears, Seasonal Allergies, Sinus Pain, Sore Throat and Yellow Eyes. Respiratory Present- Chronic Cough. Not Present- Bloody sputum, Difficulty Breathing, Snoring and Wheezing. Cardiovascular Present- Leg Cramps and Swelling of Extremities. Not Present- Chest Pain, Difficulty Breathing Lying Down, Palpitations, Rapid Heart Rate and Shortness of Breath. Gastrointestinal Present- Bloating, Chronic diarrhea,  Excessive gas and Gets full quickly at meals. Not Present- Abdominal Pain, Bloody Stool, Change in Bowel Habits, Constipation, Difficulty Swallowing, Hemorrhoids, Indigestion, Nausea, Rectal Pain and Vomiting. Musculoskeletal Present- Joint  Pain. Not Present- Back Pain, Joint Stiffness, Muscle Pain, Muscle Weakness and Swelling of Extremities. Neurological Present- Decreased Memory, Numbness, Trouble walking and Weakness. Not Present- Fainting, Headaches, Seizures, Tingling and Tremor. Psychiatric Present- Depression. Not Present- Anxiety, Bipolar, Change in Sleep Pattern, Fearful and Frequent crying. Hematology Present- Blood Thinners and Gland problems. Not Present- Easy Bruising, Excessive bleeding, HIV and Persistent Infections.  Vitals Sabino Gasser CMA; 05/24/2018 2:03 PM) 05/24/2018 2:02 PM Weight: 156.25 lb Height: 67in Body Surface Area: 1.82 m Body Mass Index: 24.47 kg/m  Temp.: 98.62F(Oral)  Pulse: 93 (Regular)  BP: 126/72 (Sitting, Left Arm, Standard)      Physical Exam Nicole Ruff MD; 7/37/1062 2:32 PM)  General Mental Status-Alert. General Appearance-Not in acute distress. Build & Nutrition-Well nourished. Posture-Normal posture. Gait-Normal.  Head and Neck Head-normocephalic, atraumatic with no lesions or palpable masses. Trachea-midline.  Chest and Lung Exam Chest and lung exam reveals -on auscultation, normal breath sounds, no adventitious sounds and normal vocal resonance.  Cardiovascular Cardiovascular examination reveals -normal heart sounds, regular rate and rhythm with no murmurs and no digital clubbing, cyanosis, edema, increased warmth or tenderness.  Abdomen Inspection Inspection of the abdomen reveals - No Hernias. Palpation/Percussion Palpation and Percussion of the abdomen reveal - Soft, Non Tender, No Rigidity (guarding), No hepatosplenomegaly and No Palpable abdominal masses.  Rectal Anorectal Exam External -  normal sphincter . Internal - Note: Tumor palpated approximately 8 cm from anal verge.  Neurologic Neurologic evaluation reveals -alert and oriented x 3 with no impairment of recent or remote memory, normal attention span and ability to concentrate, normal sensation and normal coordination.  Musculoskeletal Normal Exam - Bilateral-Upper Extremity Strength Normal and Lower Extremity Strength Normal.    Assessment & Plan Nicole Ruff MD; 6/94/8546 2:36 PM)  RECTAL CANCER ([facility]) Impression: 68 year old female who presents to the office after completing radiation and chemotherapy at the end of April 2020. She has a tumor that is noted to be approximate 7 cm from anal verge. We discussed performing a robotic low anterior resection. I think she is a good candidate for anastomosis. We discussed possible need for temporary ileostomy. All questions were answered. We will plan on flex sig on the OR table to localize tumor on day of surgery. The surgery and anatomy were described to the patient as well as the risks of surgery and the possible complications. These include: Bleeding, deep abdominal infections and possible wound complications such as hernia and infection, damage to adjacent structures, leak of surgical connections, which can lead to other surgeries and possibly an ostomy, possible need for other procedures, such as abscess drains in radiology, possible prolonged hospital stay, possible diarrhea from removal of part of the colon, possible constipation from narcotics, possible bowel, bladder or sexual dysfunction if having rectal surgery, prolonged fatigue/weakness or appetite loss, possible early recurrence of of disease, possible complications of their medical problems such as heart disease or arrhythmias or lung problems, death (less than 1%). I believe the patient understands and wishes to proceed with the surgery.

## 2018-05-25 ENCOUNTER — Telehealth: Payer: Self-pay

## 2018-05-25 NOTE — Telephone Encounter (Signed)
   [facility] Pre-operative Risk Assessment    Request for surgical clearance:  1. What type of surgery is being performed? Robotic low anterior resection and umbilical hernia repair   2. When is this surgery scheduled? TBD   3. What type of clearance is required (medical clearance vs. Pharmacy clearance to hold med vs. Both)? Both  4. Are there any medications that need to be held prior to surgery and how long?Plavix   5. Practice name and name of physician performing surgery? [facility]    6. What is your office phone number [phone]    7.   What is your office fax number [phone]  8.   Anesthesia type (None, local, MAC, general) ?  General   Meryl Crutch 05/25/2018, 9:11 AM  _________________________________________________________________   (provider comments below)

## 2018-05-28 NOTE — Telephone Encounter (Signed)
Please comment on holding Plavix

## 2018-05-28 NOTE — Telephone Encounter (Signed)
Left VM

## 2018-05-28 NOTE — Telephone Encounter (Signed)
Okay to hold antiplatelet therapy for umbilical hernia surgery.

## 2018-05-30 NOTE — Telephone Encounter (Signed)
Left second voicemail, also attempted to call her brother (on file).

## 2018-06-01 NOTE — Telephone Encounter (Signed)
Contacted patient to get her a follow up appt scheduled. After some convincing I was able to get patient scheduled with Kerin Ransom, PA-C on 06/07/2018. Patient agreeable with appt. She stated she had an appt sometime next week, but she doesn't know the date. I advised patient to check her records and contact the office to reschedule if the date and time does not work for her. She voiced understanding.          COVID-19 Pre-Screening Questions:  . In the past 7 to 10 days have you had a cough,  shortness of breath, headache, congestion, fever (100 or greater) body aches, chills, sore throat, or sudden loss of taste or sense of smell? NO . Have you been around anyone with known Covid 19. NO . Have you been around anyone who is awaiting Covid 19 test results in the past 7 to 10 days? NO Have you been around anyone who has been exposed to Covid 19, or has mentioned symptoms of Covid 19 within the past 7 to 10 days? NO  If you have any concerns/questions about symptoms patients report during screening (either on the phone or at threshold). Contact the provider seeing the patient or DOD for further guidance.  If neither are available contact a member of the leadership team.

## 2018-06-01 NOTE — Telephone Encounter (Signed)
   Primary Cardiologist:Jonathan Gwenlyn Found, MD  Chart reviewed as part of pre-operative protocol coverage. Patient was contact 06/01/2018 and reported having some DOE and intermittent chest discomfort. Because of Nicole Sellers's past medical history and recent symptoms, she will require a follow-up visit in order to better assess preoperative cardiovascular risk.  Per Dr. Gwenlyn Found, patient can hold plavix for upcoming hernia repair.  Pre-op covering staff: - Please schedule appointment and call patient to inform them. - Please contact requesting surgeon's office via preferred method (i.e, phone, fax) to inform them of need for appointment prior to surgery.   Abigail Butts, PA-C  06/01/2018, 12:40 PM

## 2018-06-06 ENCOUNTER — Telehealth: Payer: Self-pay

## 2018-06-06 ENCOUNTER — Encounter (HOSPITAL_COMMUNITY): Payer: Medicare Other

## 2018-06-06 NOTE — Telephone Encounter (Signed)
Left message for patient to call back the office to answer covid-19 prescreening questions prior to her upcoming appt.

## 2018-06-07 ENCOUNTER — Encounter: Payer: Self-pay | Admitting: Cardiology

## 2018-06-07 ENCOUNTER — Other Ambulatory Visit: Payer: Self-pay

## 2018-06-07 ENCOUNTER — Ambulatory Visit (INDEPENDENT_AMBULATORY_CARE_PROVIDER_SITE_OTHER): Payer: Medicare Other | Admitting: Cardiology

## 2018-06-07 VITALS — BP 134/64 | HR 74 | Temp 97.3°F | Ht 66.5 in | Wt 154.0 lb

## 2018-06-07 DIAGNOSIS — I739 Peripheral vascular disease, unspecified: Secondary | ICD-10-CM | POA: Diagnosis not present

## 2018-06-07 DIAGNOSIS — Z01810 Encounter for preprocedural cardiovascular examination: Secondary | ICD-10-CM | POA: Diagnosis not present

## 2018-06-07 DIAGNOSIS — E119 Type 2 diabetes mellitus without complications: Secondary | ICD-10-CM | POA: Diagnosis not present

## 2018-06-07 DIAGNOSIS — I10 Essential (primary) hypertension: Secondary | ICD-10-CM

## 2018-06-07 DIAGNOSIS — I6522 Occlusion and stenosis of left carotid artery: Secondary | ICD-10-CM | POA: Diagnosis not present

## 2018-06-07 DIAGNOSIS — C20 Malignant neoplasm of rectum: Secondary | ICD-10-CM

## 2018-06-07 NOTE — Assessment & Plan Note (Signed)
Pt needs colectomy and umbilical hernia repair.  She has DM, PVD, and has had some chest pain and DOE when she had chemo. No prior cardiac function assessment.

## 2018-06-07 NOTE — Progress Notes (Signed)
[place]   Telephone:([phone]) [phone] Fax:([phone]) [phone]   Clinic Follow up Note   Patient Care Team: Reynold Bowen, MD as PCP - General (Endocrinology) Lorretta Harp, MD as PCP - Cardiology (Cardiology)  Date of Service:  06/11/2018  CHIEF COMPLAINT: F/u of Rectal cancer  SUMMARY OF ONCOLOGIC HISTORY: Oncology History   Cancer Staging Rectal cancer [facility]) Staging form: Colon and Rectum, AJCC 8th Edition - Clinical stage from 10/04/2017: Stage IIIB (cT3, cN1, cM0) - Signed by Truitt Merle, MD on 11/18/2017       Rectal cancer ([place])   10/04/2017 Procedure    Colonoscopy per Dr. Collene Mares 10/04/17 shows a large, non-obstructing horse-shoe shaped mass was found in the recto-sigmoid colon at 10 cm; the mass was partially circumferential involving one-half of the lumen circumference. The mass measured seven cm in length    10/04/2017 Initial Biopsy    Rectum, at 10-17 cm, mass biopsy -invasive adenocarcinoma, moderately differentiated  ICH stains for MLH1, MSH2, MSH6, and PMS2 are intact (normal)    10/04/2017 Cancer Staging    Staging form: Colon and Rectum, AJCC 8th Edition - Clinical stage from 10/04/2017: Stage IIIB (cT3, cN1, cM0) - Signed by Truitt Merle, MD on 11/18/2017    10/25/2017 Imaging    CT CAP IMPRESSION: 1. Irregular eccentric mass within the sigmoid colon. There are at least 3 abnormal appearing lymph node adjacent to the sigmoid colon at the level of the mass concerning for the possibility of local nodal metastatic disease. 2. Multiple pulmonary nodules as described above. These are indeterminate in etiology. Recommend attention on follow-up.    11/06/2017 Initial Diagnosis    Adenocarcinoma of colon ([place])    11/16/2017 Imaging    Pelvic MRI 11/16/17  IMPRESSION: Rectal adenocarcinoma T stage:   T3 Rectal adenocarcinoma N stage:  N1 Distance from tumor to the anal sphincter is 6.9 cm.    11/29/2017 - 02/15/2018 Chemotherapy    totalneoadjuvant chemo  FOLFOXq2 weeks x4 months, starting 11/29/17-03/07/18, followed by concurrent chemoRT. Completed 8 cycles of FOLFOX on 03/07/18    01/17/2018 Imaging    CT AP W Contrast 01/17/18 IMPRESSION: 1. Previously seen soft tissue mass involving the left lateral wall the upper rectum is no longer visualized. 2. Decreased size of left perirectal lymph nodes since prior exam. 3. No other sites of metastatic disease within the abdomen or pelvis. 4. Colonic diverticulosis, without radiographic evidence of diverticulitis. 5. Stable small adjacent paraumbilical hernias containing only fat.    04/09/2018 - 05/17/2018 Chemotherapy    Concurrent ChemoRT with Xeloda '1500mg'$  BID on days of radiation M-F starting 04/09/18. Held week of 04/30/18 due to dirrhea. Restarted 05/07/18 at reduced dose of '1000mg'$  in the AM and 1000-'1500mg'$  in the PM. Completed 05/17/18.     04/09/2018 - 05/17/2018 Radiation Therapy    concurrent chemoRT with Dr. Lisbeth Renshaw   04/09/18-05/17/18      CURRENT THERAPY:  PENDING Surgery   INTERVAL HISTORY:  Nicole Sellers is here for a follow up. She presents to the clinic alone. She notes since Chemo RT she has hard time using hands from numbness and has feet swelling tightly due to neuropathy. She notes she does take B12. She notes having occasional diarrhea, but not often. She denies blood in stool or bleeding.     REVIEW OF SYSTEMS:   Constitutional: Denies fevers, chills or abnormal weight loss Eyes: Denies blurriness of vision Ears, nose, mouth, throat, and face: Denies mucositis or sore throat Respiratory:  Denies cough, dyspnea or wheezes Cardiovascular: Denies palpitation, chest discomfort or lower extremity swelling Gastrointestinal:  Denies nausea, heartburn (+) Occasional diarrhea  Skin: Denies abnormal skin rashes Lymphatics: Denies new lymphadenopathy or easy bruising Neurological: (+) Numbness of hands and tight feeling of feet from neuropathy  Behavioral/Psych: Mood is stable, no new  changes  All other systems were reviewed with the patient and are negative.  MEDICAL HISTORY:  Past Medical History:  Diagnosis Date  . Allergy   . Anemia   . Arthritis    "joints sometimes" (12/25/2012)  . Clotting disorder ([place])   . Colon cancer ([place])   . Critical lower limb ischemia   . Gangrene of toe, Rt second toe 12/25/2012  . GERD (gastroesophageal reflux disease)   . High cholesterol   . Hypertension   . Hypothyroidism   . PAD (peripheral artery disease) ([place])   . S/P arterial stent, 12/25/13, successful diamondback orbital rotational arthrectomy, PTA using chocolate  balloon and stenting using I DEV stent of long segment calcified high-grade proximal and mid r 12/25/2012  . Tobacco abuse   . Type II diabetes mellitus ([place])     SURGICAL HISTORY: Past Surgical History:  Procedure Laterality Date  . ABDOMINAL AORTAGRAM  12/20/2012   Procedure: ABDOMINAL AORTAGRAM;  Surgeon: Lorretta Harp, MD;  Location[facility] CATH LAB;  Service: Cardiovascular;;  . ABDOMINAL HYSTERECTOMY  2000  . ANGIOPLASTY / STENTING FEMORAL Right 12/25/2012  . ATHERECTOMY Right 12/25/2012   Procedure: ATHERECTOMY;  Surgeon: Lorretta Harp, MD;  Location: [facility] CATH LAB;  Service: Cardiovascular;  Laterality: Right;  right SFA  . HERNIA REPAIR    . LOWER EXTREMITY ANGIOGRAM N/A 12/20/2012   Procedure: LOWER EXTREMITY ANGIOGRAM;  Surgeon: Lorretta Harp, MD;  Location: [facility] CATH LAB;  Service: Cardiovascular;  Laterality: N/A;  . LOWER EXTREMITY ANGIOGRAM N/A 12/25/2012   Procedure: LOWER EXTREMITY ANGIOGRAM;  Surgeon: Lorretta Harp, MD;  Location: [facility] CATH LAB;  Service: Cardiovascular;  Laterality: N/A;  . LOWER EXTREMITY INTERVENTION  05/08/2017  . LOWER EXTREMITY INTERVENTION Bilateral 05/08/2017   Procedure: LOWER EXTREMITY INTERVENTION;  Surgeon: Lorretta Harp, MD;  Location: [facility] CV LAB;  Service: Cardiovascular;  Laterality: Bilateral;  . PERIPHERAL VASCULAR BALLOON ANGIOPLASTY  Right 05/08/2017   Procedure: PERIPHERAL VASCULAR BALLOON ANGIOPLASTY;  Surgeon: Lorretta Harp, MD;  Location: [facility] CV LAB;  Service: Cardiovascular;  Laterality: Right;  sfa  . PERIPHERAL VASCULAR INTERVENTION Right 05/08/2017   Procedure: PERIPHERAL VASCULAR INTERVENTION;  Surgeon: Lorretta Harp, MD;  Location: [facility] LAB;  Service: Cardiovascular;  Laterality: Right;  ext iliac  . PORTACATH PLACEMENT Right 11/22/2017   Procedure: INSERTION PORT-A-CATH;  Surgeon: Leighton Ruff, MD;  Location: [facility] ORS;  Service: General;  Laterality: Right;  . TOE SURGERY Right    2n toe   . UMBILICAL HERNIA REPAIR  2000    I have reviewed the social history and family history with the patient and they are unchanged from previous note.  ALLERGIES:  is allergic to kiwi extract; lisinopril; and plavix [clopidogrel bisulfate].  MEDICATIONS:  Current Outpatient Medications  Medication Sig Dispense Refill  . acetaminophen (TYLENOL) 500 MG tablet Take 500 mg by mouth 2 (two) times daily as needed for moderate pain or headache.     . albuterol (PROVENTIL HFA;VENTOLIN HFA) 108 (90 BASE) MCG/ACT inhaler Inhale 2 puffs into the lungs every 6 (six) hours as needed for wheezing or shortness of breath.    Marland Kitchen amLODipine (  NORVASC) 5 MG tablet TAKE 1 TABLET BY MOUTH EVERY DAY 90 tablet 2  . aspirin 81 MG tablet Take 81 mg by mouth daily.    Marland Kitchen atorvastatin (LIPITOR) 10 MG tablet Take 1 tablet (10 mg total) by mouth daily. 90 tablet 1  . Calcium Carbonate-Vitamin D (CALCIUM 600+D PO) Take 1 tablet by mouth daily.    . cetirizine (ZYRTEC) 10 MG tablet Take 10 mg by mouth daily.    . cholecalciferol (VITAMIN D) 1000 units tablet Take 4,000 Units by mouth daily.     . clopidogrel (PLAVIX) 75 MG tablet Take 1 tablet (75 mg total) by mouth daily with breakfast. 90 tablet 3  . diphenoxylate-atropine (LOMOTIL) 2.5-0.025 MG tablet Take 1-2 tablets by mouth 4 (four) times daily as needed for diarrhea or loose  stools. 40 tablet 0  . FERREX 150 150 MG capsule Take 150 mg by mouth 2 (two) times daily.  11  . gabapentin (NEURONTIN) 300 MG capsule Take 300 mg by mouth daily.     Marland Kitchen glimepiride (AMARYL) 1 MG tablet Take 1 mg by mouth 2 (two) times daily.    Marland Kitchen levothyroxine (SYNTHROID, LEVOTHROID) 50 MCG tablet Take 1 tablet (50 mcg total) by mouth daily. 90 tablet 1  . lidocaine-prilocaine (EMLA) cream Apply to affected area once 30 g 3  . magic mouthwash w/lidocaine SOLN Take 5 mLs by mouth 3 (three) times daily. Swish and Spit 240 mL 0  . metFORMIN (GLUCOPHAGE-XR) 500 MG 24 hr tablet TAKE 2 TABLETS BY MOUTH  TWICE A DAY 60 tablet 0  . mirtazapine (REMERON) 7.5 MG tablet Take 2 tablets (15 mg total) by mouth at bedtime. 60 tablet 2  . Misc Natural Products (OSTEO BI-FLEX ADV TRIPLE ST) TABS Take 1 tablet by mouth 2 (two) times daily.    . Multiple Vitamin (MULTIVITAMIN) capsule Take 1 capsule by mouth daily.    . ondansetron (ZOFRAN) 8 MG tablet Take 1 tablet (8 mg total) by mouth 2 (two) times daily as needed for refractory nausea / vomiting. Start on day 3 after chemotherapy. 30 tablet 1  . prochlorperazine (COMPAZINE) 10 MG tablet Take 1 tablet (10 mg total) by mouth every 6 (six) hours as needed (Nausea or vomiting). 30 tablet 1  . tetrahydrozoline-zinc (VISINE-AC) 0.05-0.25 % ophthalmic solution Place 1 drop into both eyes 2 (two) times daily as needed (dry eyes).    . Triamcinolone Acetonide (NASACORT ALLERGY 24HR NA) Place 1 spray into the nose daily as needed (allergies).    . triamterene-hydrochlorothiazide (MAXZIDE) 75-50 MG tablet Take 1 tablet by mouth daily.    Loura Pardon Salicylate (ASPERCREME EX) Apply 1 application topically daily as needed (joint pain).    . vitamin B-12 (CYANOCOBALAMIN) 500 MCG tablet Take 500 mcg by mouth daily.     No current facility-administered medications for this visit.    Facility-Administered Medications Ordered in Other Visits  Medication Dose Route Frequency  Provider Last Rate Last Dose  . heparin lock flush 100 unit/mL  500 Units Intracatheter Once PRN Cira Rue K, NP      . sodium chloride flush (NS) 0.9 % injection 10 mL  10 mL Intracatheter Once PRN Alla Feeling, NP        PHYSICAL EXAMINATION: ECOG PERFORMANCE STATUS: 1 - Symptomatic but completely ambulatory  Vitals:   06/11/18 1111  BP: (!) 141/61  Pulse: 81  Resp: 18  Temp: 98.2 F (36.8 C)  SpO2: 100%   Filed Weights  06/11/18 1111  Weight: 155 lb 11.2 oz (70.6 kg)    GENERAL:alert, no distress and comfortable SKIN: skin color, texture, turgor are normal, no rashes or significant lesions EYES: normal, Conjunctiva are pink and non-injected, sclera clear  NECK: supple, thyroid normal size, non-tender, without nodularity LYMPH:  no palpable lymphadenopathy in the cervical, axillary  LUNGS: clear to auscultation and percussion with normal breathing effort HEART: regular rate & rhythm and no murmurs (+) Mild lower extremity edema ABDOMEN:abdomen soft, non-tender and normal bowel sounds Musculoskeletal:no cyanosis of digits and no clubbing (+) in left foot boot from irritated foot NEURO: alert & oriented x 3 with fluent speech, no focal motor (+) Moderate sensory deficits in hands and no sensory in feet  LABORATORY DATA:  I have reviewed the data as listed CBC Latest Ref Rng & Units 06/11/2018 05/14/2018 05/07/2018  WBC 4.0 - 10.5 K/uL 2.9(L) 2.7(L) 3.5(L)  Hemoglobin 12.0 - 15.0 g/dL 9.5(L) 10.1(L) 9.7(L)  Hematocrit 36.0 - 46.0 % 29.2(L) 30.4(L) 28.9(L)  Platelets 150 - 400 K/uL 285 295 296     CMP Latest Ref Rng & Units 06/11/2018 05/14/2018 05/07/2018  Glucose 70 - 99 mg/dL 132(H) 81 74  BUN 8 - 23 mg/dL '13 11 9  '$ Creatinine 0.44 - 1.00 mg/dL 1.01(H) 0.85 0.81  Sodium 135 - 145 mmol/L 141 140 140  Potassium 3.5 - 5.1 mmol/L 4.0 3.2(L) 3.4(L)  Chloride 98 - 111 mmol/L 107 105 106  CO2 22 - 32 mmol/L '25 26 23  '$ Calcium 8.9 - 10.3 mg/dL 9.2 8.6(L) 8.6(L)  Total Protein  6.5 - 8.1 g/dL 6.4(L) 6.4(L) 6.4(L)  Total Bilirubin 0.3 - 1.2 mg/dL 0.3 0.3 0.3  Alkaline Phos 38 - 126 U/L 97 82 86  AST 15 - 41 U/L '26 20 22  '$ ALT 0 - 44 U/L '17 9 11      '$ RADIOGRAPHIC STUDIES: I have personally reviewed the radiological images as listed and agreed with the findings in the report. No results found.   ASSESSMENT & PLAN:  Nicole Sellers is a 68 y.o. female with   1. Adenocarcinoma of the rectosigmoid colon, moderately differentiated,cT3N1M0, stage IIIB,MMR normal -Diagnosed in 09/2017.She had complete neoadjuvantchemo and radiation, including 4 months ofFOLFOXwithUdenyca, followed by concurrent chemoRT with Xeloda'1500mg'$  BID, which she completed on 05/17/18. -She is still recovering from treatment. She has occasional diarrhea now. She has significant neuropathy from her DM and prior chemo. Will monitor improvement off treatment. -I discussed her next step is surgery to remove her cancer. She saw Dr. Marcello Moores and surgery will be scheduled in July. She is very apprehensive about surgery, very concerned about the possible complications from surgery. I disused that the surgery is still to the standard of care, and the chance of complete cure with chemo and radiation alone is very low (10-15%), I also reminded her there is a window (8-12 weeks) to have surgery. I strongly encourage her to consider surgery.   -If she chooses to not proceed with surgery we will repeat sigmoidoscopy and CT scan to see if she has residual disease. -I discussed after surgery she still has moderate risk of recurrence in which we would monitor with cancer surveillance. -She will think about surgery and discuss with her son, Dr Marcello Moores and other rectal cancer survivors. -I will f/u with her in 1 month  2. Iron deficiency anemia, secondary to #1 -She is currently on2oral ironpills a day.Will continue. -She previously declined IV iron. -Hgat9.5today (06/11/18)   3. Depression -Due  to her  diagnosis and treatment. -Currently on mirtazapine. -Her mood has been stable.  4. Diarrhea -She initially complained of constipation that has changed to diarrheaafter chemo. -Diarrhea has returned due to Xeloda and radiationand now significant.  -I instructed her to take Imodium twice at first sign of diarrhea and then every 3 hours or she can alternate with lomotil and take up to 8 tabs each daily.  -Now that she is off treatment this is improving. Diarrhea now occasional, not often.   5.HTN, DM -Continue amlodipine andMaxzide. I recommend her to monitor BP at home. We discussed holding Maxzide if her blood pressurebecomes low due to diarrhea -BP elevated lately, at 141/61 today (06/11/18).  6. Peripheral neuropathy, G2  -Secondary to DM and prior chemo  -She has significant numbness of hands and worse in her feet with LE swelling.  -I discussed this may improve with time but may not resolve completely  -I encouraged her to watch for injury, heat and ambulation.  -She will continue Gabapentin and B12.   Plan -Lab, flush and f/u in 4-5 weeks  -Copy note to Dr Bethel Born Dr. Marcello Moores    No problem-specific Assessment & Plan notes found for this encounter.   No orders of the defined types were placed in this encounter.  All questions were answered. The patient knows to call the clinic with any problems, questions or concerns. No barriers to learning was detected. I spent 20 minutes counseling the patient face to face. The total time spent in the appointment was 25 minutes and more than 50% was on counseling and review of test results     Truitt Merle, MD 06/11/2018   I, Joslyn Devon, am acting as scribe for Truitt Merle, MD.   I have reviewed the above documentation for accuracy and completeness, and I agree with the above.

## 2018-06-07 NOTE — Progress Notes (Signed)
06/07/2018 Nicole Sellers   May 08, 1950  [id number]  Primary Physician Nicole Bowen, MD Primary Cardiologist: Dr Nicole Sellers  HPI: Patient is a 68 year old female followed by Dr. Alvester Sellers.  She has a history of peripheral vascular disease.  She had a right SFA PTA in 2014 for an ischemic toe, this was followed by second right toe amputation.  In April 2019 she had a right external artery PTA.  She is done well since.  She has had no history of coronary disease, and no prior cardiac function assessment.  Other medical problems include non-insulin-dependent diabetes and hypertension.  She quit smoking in 2016.  She has moderate left internal carotid disease by Doppler.  This winter she was diagnosed with colorectal cancer.  She underwent chemotherapy and radiation therapy.  She is been followed by Dr. Burr Sellers.  She apparently needs colectomy.  She tells me this is scheduled for July.  She is here today for cardiac clearance.  In reviewing her lab studies she does have coronary calcification on prior chest CT October 2019.  She denies any anginal symptoms now although she admits she did have some chest pain during her chemotherapy.  She is also noticed some lower extremity edema in the last month or so.  She denies orthopnea or unusual dyspnea.   Current Outpatient Medications  Medication Sig Dispense Refill  . acetaminophen (TYLENOL) 500 MG tablet Take 500 mg by mouth 2 (two) times daily as needed for moderate pain or headache.     . albuterol (PROVENTIL HFA;VENTOLIN HFA) 108 (90 BASE) MCG/ACT inhaler Inhale 2 puffs into the lungs every 6 (six) hours as needed for wheezing or shortness of breath.    Nicole Sellers amLODipine (NORVASC) 5 MG tablet TAKE 1 TABLET BY MOUTH EVERY DAY 90 tablet 2  . aspirin 81 MG tablet Take 81 mg by mouth daily.    Nicole Sellers atorvastatin (LIPITOR) 10 MG tablet Take 1 tablet (10 mg total) by mouth daily. 90 tablet 1  . Calcium Carbonate-Vitamin D (CALCIUM 600+D PO) Take 1 tablet by mouth daily.     . cetirizine (ZYRTEC) 10 MG tablet Take 10 mg by mouth daily.    . cholecalciferol (VITAMIN D) 1000 units tablet Take 4,000 Units by mouth daily.     . clopidogrel (PLAVIX) 75 MG tablet Take 1 tablet (75 mg total) by mouth daily with breakfast. 90 tablet 3  . diphenoxylate-atropine (LOMOTIL) 2.5-0.025 MG tablet Take 1-2 tablets by mouth 4 (four) times daily as needed for diarrhea or loose stools. 40 tablet 0  . FERREX 150 150 MG capsule Take 150 mg by mouth 2 (two) times daily.  11  . gabapentin (NEURONTIN) 300 MG capsule Take 300 mg by mouth daily.     Nicole Sellers glimepiride (AMARYL) 1 MG tablet Take 1 mg by mouth 2 (two) times daily.    Nicole Sellers levothyroxine (SYNTHROID, LEVOTHROID) 50 MCG tablet Take 1 tablet (50 mcg total) by mouth daily. 90 tablet 1  . lidocaine-prilocaine (EMLA) cream Apply to affected area once 30 g 3  . magic mouthwash w/lidocaine SOLN Take 5 mLs by mouth 3 (three) times daily. Swish and Spit 240 mL 0  . metFORMIN (GLUCOPHAGE-XR) 500 MG 24 hr tablet TAKE 2 TABLETS BY MOUTH  TWICE A DAY 60 tablet 0  . mirtazapine (REMERON) 7.5 MG tablet Take 2 tablets (15 mg total) by mouth at bedtime. 60 tablet 2  . Misc Natural Products (OSTEO BI-FLEX ADV TRIPLE ST) TABS Take 1 tablet by mouth 2 (  two) times daily.    . Multiple Vitamin (MULTIVITAMIN) capsule Take 1 capsule by mouth daily.    . ondansetron (ZOFRAN) 8 MG tablet Take 1 tablet (8 mg total) by mouth 2 (two) times daily as needed for refractory nausea / vomiting. Start on day 3 after chemotherapy. 30 tablet 1  . prochlorperazine (COMPAZINE) 10 MG tablet Take 1 tablet (10 mg total) by mouth every 6 (six) hours as needed (Nausea or vomiting). 30 tablet 1  . tetrahydrozoline-zinc (VISINE-AC) 0.05-0.25 % ophthalmic solution Place 1 drop into both eyes 2 (two) times daily as needed (dry eyes).    . Triamcinolone Acetonide (NASACORT ALLERGY 24HR NA) Place 1 spray into the nose daily as needed (allergies).    . triamterene-hydrochlorothiazide  (MAXZIDE) 75-50 MG tablet Take 1 tablet by mouth daily.    Nicole Sellers Salicylate (ASPERCREME EX) Apply 1 application topically daily as needed (joint pain).    . vitamin B-12 (CYANOCOBALAMIN) 500 MCG tablet Take 500 mcg by mouth daily.     No current facility-administered medications for this visit.    Facility-Administered Medications Ordered in Other Visits  Medication Dose Route Frequency Provider Last Rate Last Dose  . heparin lock flush 100 unit/mL  500 Units Intracatheter Once PRN Cira Rue K, NP      . sodium chloride flush (NS) 0.9 % injection 10 mL  10 mL Intracatheter Once PRN Alla Feeling, NP        Allergies  Allergen Reactions  . Kiwi Extract Itching    Throat itching   . Lisinopril     Bruising   . Plavix [Clopidogrel Bisulfate]     Upset stomach, bloody stool, bruising     Past Medical History:  Diagnosis Date  . Allergy   . Anemia   . Arthritis    "joints sometimes" (12/25/2012)  . Clotting disorder ([place])   . Colon cancer ([place])   . Critical lower limb ischemia   . Gangrene of toe, Rt second toe 12/25/2012  . GERD (gastroesophageal reflux disease)   . High cholesterol   . Hypertension   . Hypothyroidism   . PAD (peripheral artery disease) ([place])   . S/P arterial stent, 12/25/13, successful diamondback orbital rotational arthrectomy, PTA using chocolate  balloon and stenting using I DEV stent of long segment calcified high-grade proximal and mid r 12/25/2012  . Tobacco abuse   . Type II diabetes mellitus ([place])     Social History   Socioeconomic History  . Marital status: Single    Spouse name: Not on file  . Number of children: 1  . Years of education: Not on file  . Highest education level: Not on file  Occupational History  . Occupation: CNA    Comment: Retired   Scientific laboratory technician  . Financial resource strain: Not on file  . Food insecurity:    Worry: Not on file    Inability: Not on file  . Transportation needs:    Medical: Yes     Non-medical: Yes  Tobacco Use  . Smoking status: Former Smoker    Packs/day: 0.50    Years: 48.00    Pack years: 24.00    Types: Cigarettes  . Smokeless tobacco: Never Used  . Tobacco comment: quit in 2016  Substance and Sexual Activity  . Alcohol use: No    Alcohol/week: 0.0 standard drinks  . Drug use: No  . Sexual activity: Not Currently  Lifestyle  . Physical activity:    Days per  week: Not on file    Minutes per session: Not on file  . Stress: Not on file  Relationships  . Social connections:    Talks on phone: Not on file    Gets together: Not on file    Attends religious service: Not on file    Active member of club or organization: Not on file    Attends meetings of clubs or organizations: Not on file    Relationship status: Not on file  . Intimate partner violence:    Fear of current or ex partner: No    Emotionally abused: No    Physically abused: No    Forced sexual activity: No  Other Topics Concern  . Not on file  Social History Narrative  . Not on file     Family History  Problem Relation Age of Onset  . Hypertension Mother   . Stroke Mother   . Cancer Father        prostate  . Mental illness Sister   . Diabetes Brother   . Hypertension Brother   . Cancer Brother 62       prostate  . Diabetes Brother   . Hypertension Brother   . Cancer Brother        "tumors on neck and body"      Review of Systems: General: negative for chills, fever, night sweats or weight changes.  Cardiovascular: negative for chest pain, dyspnea on exertion, edema, orthopnea, palpitations, paroxysmal nocturnal dyspnea or shortness of breath Dermatological: negative for rash Respiratory: negative for cough or wheezing Urologic: negative for hematuria Abdominal: negative for nausea, vomiting, diarrhea, bright red blood per rectum, melena, or hematemesis Neurologic: negative for visual changes, syncope, or dizziness All other systems reviewed and are otherwise negative  except as noted above.    Blood pressure 134/64, pulse 74, temperature (!) 97.3 F (36.3 C), height 5' 6.5" (1.689 m), weight 154 lb (69.9 kg).  General appearance: alert, cooperative and no distress Neck: no JVD and bilateral carotid bruits Lungs: clear to auscultation bilaterally Heart: regular rate and rhythm Abdomen: soft, not distended Extremities: 1+ bilater LE edema. She has a walking boot on her Lt foot for a toe wound that being followed. Skin: Skin color, texture, turgor normal. No rashes or lesions Neurologic: Grossly normal  EKG NSR, HR 74  ASSESSMENT AND PLAN:   Pre-operative cardiovascular examination Pt needs colectomy and umbilical hernia repair.  She has DM, PVD, and has had some chest pain and DOE when she had chemo. No prior cardiac function assessment.   Non-insulin treated type 2 diabetes mellitus ([place]) On oral agents- Dr Forde Dandy follows  PAD (peripheral artery disease) Rt ABI 0.7, Lt ABI 0.46 S/p RSFA PTA dec 2014 S/P Rt EIA PTA April 2019  Rectal cancer [facility]) Colo-rectal cancer. Diagnosed Oct 2019, followed by Dr Nicole Sellers.  She needs to have colectomy  Essential hypertension Controlled.  She has noticed some LE edema-? Secondary to Norvasc   PLAN  I suggested we proceed with a Lexiscan Myoview pre op.  Further recommendations pending this result.  I suggested she speak with Dr Forde Dandy about her LE edema and Norvasc Rx.  F/U with Dr Nicole Sellers in the Fall.   Kerin Ransom PA-C 06/07/2018 3:28 PM

## 2018-06-07 NOTE — Assessment & Plan Note (Signed)
S/p RSFA PTA dec 2014 S/P Rt EIA PTA April 2019

## 2018-06-07 NOTE — Patient Instructions (Signed)
Medication Instructions:  Your physician recommends that you continue on your current medications as directed. Please refer to the Current Medication list given to you today. If you need a refill on your cardiac medications before your next appointment, please call your pharmacy.   Lab work: None  If you have labs (blood work) drawn today and your tests are completely normal, you will receive your results only by: Marland Kitchen MyChart Message (if you have MyChart) OR . A paper copy in the mail If you have any lab test that is abnormal or we need to change your treatment, we will call you to review the results.  Testing/Procedures: Your physician has requested that you have a lexiscan myoview. For further information please visit [email or web address]. Please follow instruction sheet, as given. NO MEDICATION TO HOLD PRIOR TO TEST  Follow-Up: At [facility], you and your health needs are our priority.  As part of our continuing mission to provide you with exceptional heart care, we have created designated Provider Care Teams.  These Care Teams include your primary Cardiologist (physician) and Advanced Practice Providers (APPs -  Physician Assistants and Nurse Practitioners) who all work together to provide you with the care you need, when you need it. You will need a follow up appointment in 6 months.  Please call our office 2 months in advance to schedule this appointment.  You may see Quay Burow, MD or one of the following Advanced Practice Providers on your designated Care Team:   Kerin Ransom, PA-C Roby Lofts, [place] . Sande Rives, PA-C  Any Other Special Instructions Will Be Listed Below (If Applicable).

## 2018-06-07 NOTE — Assessment & Plan Note (Signed)
Controlled.  She has noticed some LE edema-? Secondary to Norvasc

## 2018-06-07 NOTE — Progress Notes (Signed)
Created in Error

## 2018-06-07 NOTE — Assessment & Plan Note (Signed)
On oral agents- Dr Forde Dandy follows

## 2018-06-07 NOTE — Assessment & Plan Note (Signed)
Colo-rectal cancer. Diagnosed Oct 2019, followed by Dr Burr Medico.  She needs to have colectomy

## 2018-06-08 NOTE — Addendum Note (Signed)
Addended by: Ulice Brilliant T on: 06/08/2018 07:59 AM   Modules accepted: Orders

## 2018-06-11 ENCOUNTER — Other Ambulatory Visit: Payer: Self-pay

## 2018-06-11 ENCOUNTER — Inpatient Hospital Stay: Payer: Medicare Other | Attending: Nurse Practitioner

## 2018-06-11 ENCOUNTER — Inpatient Hospital Stay: Payer: Medicare Other

## 2018-06-11 ENCOUNTER — Inpatient Hospital Stay (HOSPITAL_BASED_OUTPATIENT_CLINIC_OR_DEPARTMENT_OTHER): Payer: Medicare Other | Admitting: Hematology

## 2018-06-11 VITALS — BP 141/61 | HR 81 | Temp 98.2°F | Resp 18 | Ht 66.5 in | Wt 155.7 lb

## 2018-06-11 DIAGNOSIS — E039 Hypothyroidism, unspecified: Secondary | ICD-10-CM | POA: Insufficient documentation

## 2018-06-11 DIAGNOSIS — C20 Malignant neoplasm of rectum: Secondary | ICD-10-CM

## 2018-06-11 DIAGNOSIS — Z79899 Other long term (current) drug therapy: Secondary | ICD-10-CM

## 2018-06-11 DIAGNOSIS — Z8719 Personal history of other diseases of the digestive system: Secondary | ICD-10-CM | POA: Insufficient documentation

## 2018-06-11 DIAGNOSIS — F329 Major depressive disorder, single episode, unspecified: Secondary | ICD-10-CM | POA: Insufficient documentation

## 2018-06-11 DIAGNOSIS — E7800 Pure hypercholesterolemia, unspecified: Secondary | ICD-10-CM

## 2018-06-11 DIAGNOSIS — Z9221 Personal history of antineoplastic chemotherapy: Secondary | ICD-10-CM

## 2018-06-11 DIAGNOSIS — Z7982 Long term (current) use of aspirin: Secondary | ICD-10-CM | POA: Insufficient documentation

## 2018-06-11 DIAGNOSIS — D509 Iron deficiency anemia, unspecified: Secondary | ICD-10-CM | POA: Diagnosis not present

## 2018-06-11 DIAGNOSIS — Z923 Personal history of irradiation: Secondary | ICD-10-CM

## 2018-06-11 DIAGNOSIS — I10 Essential (primary) hypertension: Secondary | ICD-10-CM | POA: Diagnosis not present

## 2018-06-11 DIAGNOSIS — G629 Polyneuropathy, unspecified: Secondary | ICD-10-CM | POA: Diagnosis not present

## 2018-06-11 DIAGNOSIS — R197 Diarrhea, unspecified: Secondary | ICD-10-CM

## 2018-06-11 DIAGNOSIS — K219 Gastro-esophageal reflux disease without esophagitis: Secondary | ICD-10-CM | POA: Diagnosis not present

## 2018-06-11 DIAGNOSIS — E119 Type 2 diabetes mellitus without complications: Secondary | ICD-10-CM | POA: Insufficient documentation

## 2018-06-11 DIAGNOSIS — I2510 Atherosclerotic heart disease of native coronary artery without angina pectoris: Secondary | ICD-10-CM | POA: Insufficient documentation

## 2018-06-11 DIAGNOSIS — F17210 Nicotine dependence, cigarettes, uncomplicated: Secondary | ICD-10-CM | POA: Insufficient documentation

## 2018-06-11 DIAGNOSIS — Z7984 Long term (current) use of oral hypoglycemic drugs: Secondary | ICD-10-CM

## 2018-06-11 DIAGNOSIS — C189 Malignant neoplasm of colon, unspecified: Secondary | ICD-10-CM

## 2018-06-11 LAB — CMP (CANCER CENTER ONLY)
ALT: 17 U/L (ref 0–44)
AST: 26 U/L (ref 15–41)
Albumin: 3.2 g/dL — ABNORMAL LOW (ref 3.5–5.0)
Alkaline Phosphatase: 97 U/L (ref 38–126)
Anion gap: 9 (ref 5–15)
BUN: 13 mg/dL (ref 8–23)
CO2: 25 mmol/L (ref 22–32)
Calcium: 9.2 mg/dL (ref 8.9–10.3)
Chloride: 107 mmol/L (ref 98–111)
Creatinine: 1.01 mg/dL — ABNORMAL HIGH (ref 0.44–1.00)
GFR, Est AFR Am: >60 mL/min (ref >60)
GFR, Estimated: 56 mL/min — ABNORMAL LOW (ref >60)
Glucose, Bld: 132 mg/dL — ABNORMAL HIGH (ref 70–99)
Potassium: 4 mmol/L (ref 3.5–5.1)
Sodium: 141 mmol/L (ref 135–145)
Total Bilirubin: 0.3 mg/dL (ref 0.3–1.2)
Total Protein: 6.4 g/dL — ABNORMAL LOW (ref 6.5–8.1)

## 2018-06-11 LAB — CBC WITH DIFFERENTIAL (CANCER CENTER ONLY)
Abs Immature Granulocytes: 0 10*3/uL (ref 0.00–0.07)
Basophils Absolute: 0 10*3/uL (ref 0.0–0.1)
Basophils Relative: 1 %
Eosinophils Absolute: 0.2 10*3/uL (ref 0.0–0.5)
Eosinophils Relative: 8 %
HCT: 29.2 % — ABNORMAL LOW (ref 36.0–46.0)
Hemoglobin: 9.5 g/dL — ABNORMAL LOW (ref 12.0–15.0)
Immature Granulocytes: 0 %
Lymphocytes Relative: 18 %
Lymphs Abs: 0.5 10*3/uL — ABNORMAL LOW (ref 0.7–4.0)
MCH: 32.4 pg (ref 26.0–34.0)
MCHC: 32.5 g/dL (ref 30.0–36.0)
MCV: 99.7 fL (ref 80.0–100.0)
Monocytes Absolute: 0.3 10*3/uL (ref 0.1–1.0)
Monocytes Relative: 11 %
Neutro Abs: 1.8 10*3/uL (ref 1.7–7.7)
Neutrophils Relative %: 62 %
Platelet Count: 285 10*3/uL (ref 150–400)
RBC: 2.93 MIL/uL — ABNORMAL LOW (ref 3.87–5.11)
RDW: 13.1 % (ref 11.5–15.5)
WBC Count: 2.9 10*3/uL — ABNORMAL LOW (ref 4.0–10.5)
nRBC: 0 % (ref 0.0–0.2)

## 2018-06-11 LAB — IRON AND TIBC
Iron: 59 ug/dL (ref 41–142)
Saturation Ratios: 22 % (ref 21–57)
TIBC: 269 ug/dL (ref 236–444)
UIBC: 210 ug/dL (ref 120–384)

## 2018-06-11 LAB — CEA (IN HOUSE-CHCC): CEA (CHCC-In House): 7.32 ng/mL — ABNORMAL HIGH (ref 0.00–5.00)

## 2018-06-11 LAB — FERRITIN: Ferritin: 152 ng/mL (ref 11–307)

## 2018-06-11 MED ORDER — SODIUM CHLORIDE 0.9% FLUSH
10.0000 mL | Freq: Once | INTRAVENOUS | Status: AC | PRN
  Administered 2018-06-11: 10 mL
  Filled 2018-06-11: qty 10

## 2018-06-11 MED ORDER — HEPARIN SOD (PORK) LOCK FLUSH 100 UNIT/ML IV SOLN
500.0000 [IU] | Freq: Once | INTRAVENOUS | Status: AC | PRN
  Administered 2018-06-11: 500 [IU]
  Filled 2018-06-11: qty 5

## 2018-06-12 ENCOUNTER — Telehealth: Payer: Self-pay | Admitting: Hematology

## 2018-06-12 ENCOUNTER — Telehealth (HOSPITAL_COMMUNITY): Payer: Self-pay | Admitting: *Deleted

## 2018-06-12 ENCOUNTER — Encounter: Payer: Self-pay | Admitting: Hematology

## 2018-06-12 ENCOUNTER — Telehealth: Payer: Self-pay

## 2018-06-12 NOTE — Telephone Encounter (Signed)
Scheduled appt per 6/1 los. ° °Left a voice message of appt date and time. °

## 2018-06-12 NOTE — Telephone Encounter (Signed)
Close encounter 

## 2018-06-12 NOTE — Telephone Encounter (Signed)
Faxed OV from 06/11/2018 to Dr. Lavena Bullion at fax [phone]

## 2018-06-13 ENCOUNTER — Other Ambulatory Visit: Payer: Self-pay

## 2018-06-13 ENCOUNTER — Ambulatory Visit (HOSPITAL_COMMUNITY)
Admission: RE | Admit: 2018-06-13 | Discharge: 2018-06-13 | Disposition: A | Discharge status: Home / Self Care | Payer: Medicare Other | Source: Ambulatory Visit | Attending: Cardiology | Admitting: Cardiology

## 2018-06-13 ENCOUNTER — Other Ambulatory Visit: Payer: Self-pay | Admitting: Cardiovascular Disease

## 2018-06-13 DIAGNOSIS — I6523 Occlusion and stenosis of bilateral carotid arteries: Secondary | ICD-10-CM | POA: Insufficient documentation

## 2018-06-14 ENCOUNTER — Ambulatory Visit (HOSPITAL_COMMUNITY)
Admission: RE | Admit: 2018-06-14 | Discharge: 2018-06-14 | Disposition: A | Discharge status: Home / Self Care | Payer: Medicare Other | Source: Ambulatory Visit | Attending: Cardiology | Admitting: Cardiology

## 2018-06-14 DIAGNOSIS — E119 Type 2 diabetes mellitus without complications: Secondary | ICD-10-CM

## 2018-06-14 DIAGNOSIS — C20 Malignant neoplasm of rectum: Secondary | ICD-10-CM | POA: Diagnosis present

## 2018-06-14 DIAGNOSIS — I739 Peripheral vascular disease, unspecified: Secondary | ICD-10-CM

## 2018-06-14 DIAGNOSIS — I6522 Occlusion and stenosis of left carotid artery: Secondary | ICD-10-CM | POA: Diagnosis present

## 2018-06-14 DIAGNOSIS — Z01810 Encounter for preprocedural cardiovascular examination: Secondary | ICD-10-CM | POA: Diagnosis not present

## 2018-06-14 MED ORDER — TECHNETIUM TC 99M TETROFOSMIN IV KIT
10.8000 | PACK | Freq: Once | INTRAVENOUS | Status: AC | PRN
  Administered 2018-06-14: 10.8 via INTRAVENOUS
  Filled 2018-06-14: qty 11

## 2018-06-14 MED ORDER — TECHNETIUM TC 99M TETROFOSMIN IV KIT
31.4000 | PACK | Freq: Once | INTRAVENOUS | Status: AC | PRN
  Administered 2018-06-14: 31.4 via INTRAVENOUS
  Filled 2018-06-14: qty 32

## 2018-06-14 MED ORDER — REGADENOSON 0.4 MG/5ML IV SOLN
0.4000 mg | Freq: Once | INTRAVENOUS | Status: AC
  Administered 2018-06-14: 0.4 mg via INTRAVENOUS

## 2018-06-15 LAB — MYOCARDIAL PERFUSION IMAGING
LV dias vol: 103 mL (ref 46–106)
LV sys vol: 45 mL
Peak HR: 91 {beats}/min
Rest HR: 79 {beats}/min
SDS: 4
SRS: 3
SSS: 7
TID: 1.01

## 2018-06-15 NOTE — Telephone Encounter (Signed)
   Primary Cardiologist: Quay Burow, MD  Chart reviewed as part of pre-operative protocol coverage. Given past medical history and time since last visit, based on ACC/AHA guidelines, Nicole Sellers would be at acceptable risk for the planned procedure without further cardiovascular testing.   Myoview 06/14/2018 low risk  I will route this recommendation to the requesting party via [place] fax function and remove from pre-op pool.  Please call with questions.  Kerin Ransom, PA-C 06/15/2018, 3:06 PM

## 2018-06-19 ENCOUNTER — Other Ambulatory Visit: Payer: Self-pay

## 2018-06-19 DIAGNOSIS — I6529 Occlusion and stenosis of unspecified carotid artery: Secondary | ICD-10-CM

## 2018-06-19 DIAGNOSIS — I6522 Occlusion and stenosis of left carotid artery: Secondary | ICD-10-CM

## 2018-06-19 NOTE — Progress Notes (Signed)
  Radiation Oncology         [phone] ________________________________  Name: Nicole Sellers MRN: [id number]  Date: 05/17/2018  DOB: 08-10-1950  End of Treatment Note  Diagnosis:   68 y.o. 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  Beams/energy:   3D / 15X, 6X Photon  Narrative: The patient tolerated radiation treatment relatively well.   She experienced moderate fatigue and loss of appetite. She is managing diarrhea with Lomotil. No other issues.  Plan: The patient has completed radiation treatment. The patient will return to radiation oncology clinic for routine followup in one month. I advised them to call or return sooner if they have any questions or concerns related to their recovery or treatment.  ------------------------------------------------  [name], MD, PhD  This document serves as a record of services personally performed by Kyung Rudd, MD. It was created on his behalf by Rae Lips, a trained medical scribe. The creation of this record is based on the scribe's personal observations and the provider's statements to them. This document has been checked and approved by the attending provider.

## 2018-06-19 NOTE — Progress Notes (Signed)
Notes recorded by Lorretta Harp, MD on 06/14/2018 at 10:39 AM EDT Slight progression of left ICA stenosis. Repeat 12 months  Known carotid artery disease follow-up.

## 2018-06-20 ENCOUNTER — Telehealth: Payer: Self-pay | Admitting: Radiation Oncology

## 2018-06-20 NOTE — Telephone Encounter (Signed)
  Radiation Oncology         [phone] ________________________________  Name: Nicole Sellers MRN: [id number]  Date of Service: 06/20/2018  DOB: 1950-11-03  Post Treatment Telephone Note  Diagnosis:   Stage IIIB, cT3N1M0 adenocarcinoma of the rectums/p total neoadjuvant chemotherapy     Interval Since Last Radiation:  5 weeks   04/09/2018 - 05/17/2018: The rectum was initially treated to 45 Gy in 25 fractions, followed by a 5.4 Gy boost delivered in 3 fractions, to yield a final total dose of 50.4 Gy.  Narrative:  The patient was contacted today for routine follow-up. During treatment she did very well with radiotherapy and is doing pretty well. She denies any bowel dysfunction or bleeding issues at this time. She does have peripheral neuropathy and is taking Neurontin without improvement.   Impression/Plan: 1. Stage IIIB, cT3N1M0 adenocarcinoma of the rectums/p neoadjuvant chemotherapy. The patient has been doing well since completion of radiotherapy. We discussed that we would be happy to continue to follow her as needed, but she will also continue to follow up with Dr. Burr Medico in medical oncology as well as with Dr. Marcello Moores as she is not interested in surgery.    Carola Rhine, PAC

## 2018-06-20 NOTE — Telephone Encounter (Signed)
See previous telephone notes

## 2018-06-28 ENCOUNTER — Telehealth: Payer: Self-pay | Admitting: Cardiovascular Disease

## 2018-06-28 NOTE — Telephone Encounter (Signed)
Please complete pre-op communication tool for type of surgery, what specifics the requesting team is asking (meidcal, pharm) and forwarding information.   Thank you  Kathyrn Drown NP-C [facility] Pager: [phone]

## 2018-06-28 NOTE — Telephone Encounter (Signed)
   [facility] Pre-operative Risk Assessment    Request for surgical clearance:  1. What type of surgery is being performed?  Robotic Anterior Resection   2. When is this surgery scheduled? TBD   3. What type of clearance is required (medical clearance vs. Pharmacy clearance to hold med vs. Both)? Both  4. Are there any medications that need to be held prior to surgery and how long? ASA/ Any blood thinners      5. Practice name and name of physician performing surgery? [facility], Dr. Leighton Ruff    6. What is your office phone number [phone]    7.   What is your office fax number [phone]  8.   Anesthesia type (None, local, MAC, general) ? General    Mendel Ryder 06/28/2018, 4:23 PM  _________________________________________________________________   (provider comments below)

## 2018-06-28 NOTE — Telephone Encounter (Signed)
New Message      Claiborne Billings is calling from [facility] about a Medical Clearance from May  Please fax the information   Fax [phone]

## 2018-06-29 NOTE — Telephone Encounter (Signed)
Dr. Gwenlyn Found, pt takes aspirin and Plavix for history of peripheral vascular disease.  She had a right SFA PTA in 2014 for an ischemic toe, this was followed by second right toe amputation.  In April 2019 she had a right external artery PTA. Low risk myoview 06/2018.  And has 60-79% stenosis of left carotid.  Can aspirin and plavix be held for robotic assisted lower abd resection, poss ostomy, umb hernia repair?  Please route response back to P CV DIV PREOP  Daune Perch, NP 06/29/2018, 1:48 PM

## 2018-07-01 NOTE — Telephone Encounter (Signed)
OK to hold anti platelet agents for Robotic surg

## 2018-07-02 NOTE — Telephone Encounter (Signed)
   Primary Cardiologist: Quay Burow, MD  Chart reviewed as part of pre-operative protocol coverage. Given past medical history and time since last visit, based on ACC/AHA guidelines, Nicole Sellers had a low risk myoview on 06/15/2018 and would be at acceptable risk for the planned procedure without further cardiovascular testing.   OK to hold aspirin and Plavix as needed for procedure.   I will route this recommendation to the requesting party via [place] fax function and remove from pre-op pool.  Please call with questions.  Daune Perch, NP 07/02/2018, 9:28 AM

## 2018-07-05 NOTE — Progress Notes (Signed)
[place]   [phone]) [phone] [phone]) [phone]   Clinic Follow up Note   Patient Care Team: Reynold Bowen, MD as PCP - General (Endocrinology) Lorretta Harp, MD as PCP - Cardiology (Cardiology) Leighton Ruff, MD as Consulting Physician (General Surgery) Juanita Craver, MD as Consulting Physician (Gastroenterology) Truitt Merle, MD as Consulting Physician (Hematology)  Date of Service:  07/09/2018  CHIEF COMPLAINT: F/u of Rectal cancer  SUMMARY OF ONCOLOGIC HISTORY: Oncology History Overview Note  Cancer Staging Rectal cancer [facility]) Staging form: Colon and Rectum, AJCC 8th Edition - Clinical stage from 10/04/2017: Stage IIIB (cT3, cN1, cM0) - Signed by Truitt Merle, MD on 11/18/2017     Rectal cancer ([place])  10/04/2017 Procedure   Colonoscopy per Dr. Collene Mares 10/04/17 shows a large, non-obstructing horse-shoe shaped mass was found in the recto-sigmoid colon at 10 cm; the mass was partially circumferential involving one-half of the lumen circumference. The mass measured seven cm in length   10/04/2017 Initial Biopsy   Rectum, at 10-17 cm, mass biopsy -invasive adenocarcinoma, moderately differentiated  ICH stains for MLH1, MSH2, MSH6, and PMS2 are intact (normal)   10/04/2017 Cancer Staging   Staging form: Colon and Rectum, AJCC 8th Edition - Clinical stage from 10/04/2017: Stage IIIB (cT3, cN1, cM0) - Signed by Truitt Merle, MD on 11/18/2017   10/25/2017 Imaging   CT CAP IMPRESSION: 1. Irregular eccentric mass within the sigmoid colon. There are at least 3 abnormal appearing lymph node adjacent to the sigmoid colon at the level of the mass concerning for the possibility of local nodal metastatic disease. 2. Multiple pulmonary nodules as described above. These are indeterminate in etiology. Recommend attention on follow-up.   11/06/2017 Initial Diagnosis   Adenocarcinoma of colon ([place])   11/16/2017 Imaging   Pelvic MRI 11/16/17  IMPRESSION: Rectal adenocarcinoma T  stage:   T3 Rectal adenocarcinoma N stage:  N1 Distance from tumor to the anal sphincter is 6.9 cm.   11/29/2017 - 02/15/2018 Chemotherapy   totalneoadjuvant chemo FOLFOXq2 weeks x4 months, starting 11/29/17-03/07/18, followed by concurrent chemoRT. Completed 8 cycles of FOLFOX on 03/07/18   01/17/2018 Imaging   CT AP W Contrast 01/17/18 IMPRESSION: 1. Previously seen soft tissue mass involving the left lateral wall the upper rectum is no longer visualized. 2. Decreased size of left perirectal lymph nodes since prior exam. 3. No other sites of metastatic disease within the abdomen or pelvis. 4. Colonic diverticulosis, without radiographic evidence of diverticulitis. 5. Stable small adjacent paraumbilical hernias containing only fat.   04/09/2018 - 05/17/2018 Chemotherapy   Concurrent ChemoRT with Xeloda '1500mg'$  BID on days of radiation M-F starting 04/09/18. Held week of 04/30/18 due to dirrhea. Restarted 05/07/18 at reduced dose of '1000mg'$  in the AM and 1000-'1500mg'$  in the PM. Completed 05/17/18.    04/09/2018 - 05/17/2018 Radiation Therapy   concurrent chemoRT with Dr. Lisbeth Renshaw   04/09/18-05/17/18      CURRENT THERAPY:  PENDING Surgery 08/03/18   INTERVAL HISTORY:  Nicole Sellers is here for a follow up. She presents to the clinic alone. She feels she is still recovering from chemo and radiation. She notes She notes numbness of fingers and feet still. She will drop small things often. She can still do chores and cooking but carefully. She notes numbness and swelling of her toes. She feels her appetite is back to baseline, she has been able to gain weight. She remains apprehensive about surgery.  I reviewed medication with her. She has been taking Gabapentin  once daily.     REVIEW OF SYSTEMS:   Constitutional: Denies fevers, chills or abnormal weight loss Eyes: Denies blurriness of vision Ears, nose, mouth, throat, and face: Denies mucositis or sore throat Respiratory: Denies cough, dyspnea or  wheezes Cardiovascular: Denies palpitation, chest discomfort or lower extremity swelling Gastrointestinal:  Denies nausea, heartburn or change in bowel habits Skin: Denies abnormal skin rashes Lymphatics: Denies new lymphadenopathy or easy bruising Neurological:Denies numbness, tingling or new weaknesses Behavioral/Psych: Mood is stable, no new changes  All other systems were reviewed with the patient and are negative.  MEDICAL HISTORY:  Past Medical History:  Diagnosis Date  . Allergy   . Anemia   . Arthritis    "joints sometimes" (12/25/2012)  . Clotting disorder ([place])   . Colon cancer ([place])   . Critical lower limb ischemia   . Gangrene of toe, Rt second toe 12/25/2012  . GERD (gastroesophageal reflux disease)   . High cholesterol   . Hypertension   . Hypothyroidism   . PAD (peripheral artery disease) ([place])   . S/P arterial stent, 12/25/13, successful diamondback orbital rotational arthrectomy, PTA using chocolate  balloon and stenting using I DEV stent of long segment calcified high-grade proximal and mid r 12/25/2012  . Tobacco abuse   . Type II diabetes mellitus ([place])     SURGICAL HISTORY: Past Surgical History:  Procedure Laterality Date  . ABDOMINAL AORTAGRAM  12/20/2012   Procedure: ABDOMINAL AORTAGRAM;  Surgeon: Lorretta Harp, MD;  Location[facility] CATH LAB;  Service: Cardiovascular;;  . ABDOMINAL HYSTERECTOMY  2000  . ANGIOPLASTY / STENTING FEMORAL Right 12/25/2012  . ATHERECTOMY Right 12/25/2012   Procedure: ATHERECTOMY;  Surgeon: Lorretta Harp, MD;  Location: [facility] CATH LAB;  Service: Cardiovascular;  Laterality: Right;  right SFA  . HERNIA REPAIR    . LOWER EXTREMITY ANGIOGRAM N/A 12/20/2012   Procedure: LOWER EXTREMITY ANGIOGRAM;  Surgeon: Lorretta Harp, MD;  Location: [facility] CATH LAB;  Service: Cardiovascular;  Laterality: N/A;  . LOWER EXTREMITY ANGIOGRAM N/A 12/25/2012   Procedure: LOWER EXTREMITY ANGIOGRAM;  Surgeon: Lorretta Harp, MD;  Location: [facility] CATH  LAB;  Service: Cardiovascular;  Laterality: N/A;  . LOWER EXTREMITY INTERVENTION  05/08/2017  . LOWER EXTREMITY INTERVENTION Bilateral 05/08/2017   Procedure: LOWER EXTREMITY INTERVENTION;  Surgeon: Lorretta Harp, MD;  Location: [place] CV LAB;  Service: Cardiovascular;  Laterality: Bilateral;  . PERIPHERAL VASCULAR BALLOON ANGIOPLASTY Right 05/08/2017   Procedure: PERIPHERAL VASCULAR BALLOON ANGIOPLASTY;  Surgeon: Lorretta Harp, MD;  Location: [place] CV LAB;  Service: Cardiovascular;  Laterality: Right;  sfa  . PERIPHERAL VASCULAR INTERVENTION Right 05/08/2017   Procedure: PERIPHERAL VASCULAR INTERVENTION;  Surgeon: Lorretta Harp, MD;  Location: [place] CV LAB;  Service: Cardiovascular;  Laterality: Right;  ext iliac  . PORTACATH PLACEMENT Right 11/22/2017   Procedure: INSERTION PORT-A-CATH;  Surgeon: Leighton Ruff, MD;  Location: [facility] ORS;  Service: General;  Laterality: Right;  . TOE SURGERY Right    2n toe   . UMBILICAL HERNIA REPAIR  2000    I have reviewed the social history and family history with the patient and they are unchanged from previous note.  ALLERGIES:  is allergic to kiwi extract; lisinopril; and plavix [clopidogrel bisulfate].  MEDICATIONS:  Current Outpatient Medications  Medication Sig Dispense Refill  . acetaminophen (TYLENOL) 500 MG tablet Take 500 mg by mouth 2 (two) times daily as needed for moderate pain or headache.     . albuterol (PROVENTIL  HFA;VENTOLIN HFA) 108 (90 BASE) MCG/ACT inhaler Inhale 2 puffs into the lungs every 6 (six) hours as needed for wheezing or shortness of breath.    Marland Kitchen amLODipine (NORVASC) 5 MG tablet TAKE 1 TABLET BY MOUTH EVERY DAY 90 tablet 2  . aspirin 81 MG tablet Take 81 mg by mouth daily.    Marland Kitchen atorvastatin (LIPITOR) 10 MG tablet Take 1 tablet (10 mg total) by mouth daily. 90 tablet 1  . Calcium Carbonate-Vitamin D (CALCIUM 600+D PO) Take 1 tablet by mouth daily.    . cetirizine (ZYRTEC) 10 MG tablet Take 10 mg by  mouth daily.    . cholecalciferol (VITAMIN D) 1000 units tablet Take 4,000 Units by mouth daily.     . clopidogrel (PLAVIX) 75 MG tablet Take 1 tablet (75 mg total) by mouth daily with breakfast. 90 tablet 3  . diphenoxylate-atropine (LOMOTIL) 2.5-0.025 MG tablet Take 1-2 tablets by mouth 4 (four) times daily as needed for diarrhea or loose stools. 40 tablet 0  . FERREX 150 150 MG capsule Take 150 mg by mouth 2 (two) times daily.  11  . gabapentin (NEURONTIN) 300 MG capsule Take 300 mg by mouth daily.     Marland Kitchen glimepiride (AMARYL) 1 MG tablet Take 1 mg by mouth 2 (two) times daily.    Marland Kitchen levothyroxine (SYNTHROID, LEVOTHROID) 50 MCG tablet Take 1 tablet (50 mcg total) by mouth daily. 90 tablet 1  . lidocaine-prilocaine (EMLA) cream Apply to affected area once 30 g 3  . magic mouthwash w/lidocaine SOLN Take 5 mLs by mouth 3 (three) times daily. Swish and Spit 240 mL 0  . metFORMIN (GLUCOPHAGE-XR) 500 MG 24 hr tablet TAKE 2 TABLETS BY MOUTH  TWICE A DAY 60 tablet 0  . mirtazapine (REMERON) 7.5 MG tablet Take 2 tablets (15 mg total) by mouth at bedtime. 60 tablet 2  . Misc Natural Products (OSTEO BI-FLEX ADV TRIPLE ST) TABS Take 1 tablet by mouth 2 (two) times daily.    . Multiple Vitamin (MULTIVITAMIN) capsule Take 1 capsule by mouth daily.    . ondansetron (ZOFRAN) 8 MG tablet Take 1 tablet (8 mg total) by mouth 2 (two) times daily as needed for refractory nausea / vomiting. Start on day 3 after chemotherapy. 30 tablet 1  . prochlorperazine (COMPAZINE) 10 MG tablet Take 1 tablet (10 mg total) by mouth every 6 (six) hours as needed (Nausea or vomiting). 30 tablet 1  . tetrahydrozoline-zinc (VISINE-AC) 0.05-0.25 % ophthalmic solution Place 1 drop into both eyes 2 (two) times daily as needed (dry eyes).    . Triamcinolone Acetonide (NASACORT ALLERGY 24HR NA) Place 1 spray into the nose daily as needed (allergies).    . triamterene-hydrochlorothiazide (MAXZIDE) 75-50 MG tablet Take 1 tablet by mouth daily.     Loura Pardon Salicylate (ASPERCREME EX) Apply 1 application topically daily as needed (joint pain).    . vitamin B-12 (CYANOCOBALAMIN) 500 MCG tablet Take 500 mcg by mouth daily.     No current facility-administered medications for this visit.    Facility-Administered Medications Ordered in Other Visits  Medication Dose Route Frequency Provider Last Rate Last Dose  . heparin lock flush 100 unit/mL  500 Units Intracatheter Once PRN Cira Rue K, NP      . sodium chloride flush (NS) 0.9 % injection 10 mL  10 mL Intracatheter Once PRN Alla Feeling, NP        PHYSICAL EXAMINATION: ECOG PERFORMANCE STATUS: 1 - Symptomatic but completely ambulatory  Vitals:   07/09/18 1335  BP: (!) 159/79  Pulse: 76  Resp: 20  Temp: 98.7 F (37.1 C)  SpO2: 100%   Filed Weights   07/09/18 1335  Weight: 160 lb 4.8 oz (72.7 kg)    GENERAL:alert, no distress and comfortable SKIN: skin color, texture, turgor are normal, no rashes or significant lesions EYES: normal, Conjunctiva are pink and non-injected, sclera clear LUNGS: clear to auscultation and percussion with normal breathing effort HEART: regular rate & rhythm and no murmurs and no lower extremity edema ABDOMEN:abdomen soft, non-tender and normal bowel sounds Musculoskeletal:no cyanosis of digits and no clubbing  NEURO: alert & oriented x 3 with fluent speech, no focal motor/sensory deficits  LABORATORY DATA:  I have reviewed the data as listed CBC Latest Ref Rng & Units 07/09/2018 06/11/2018 05/14/2018  WBC 4.0 - 10.5 K/uL 3.9(L) 2.9(L) 2.7(L)  Hemoglobin 12.0 - 15.0 g/dL 10.0(L) 9.5(L) 10.1(L)  Hematocrit 36.0 - 46.0 % 30.1(L) 29.2(L) 30.4(L)  Platelets 150 - 400 K/uL 282 285 295     CMP Latest Ref Rng & Units 07/09/2018 06/11/2018 05/14/2018  Glucose 70 - 99 mg/dL 104(H) 132(H) 81  BUN 8 - 23 mg/dL '16 13 11  '$ Creatinine 0.44 - 1.00 mg/dL 0.95 1.01(H) 0.85  Sodium 135 - 145 mmol/L 139 141 140  Potassium 3.5 - 5.1 mmol/L 4.0 4.0 3.2(L)   Chloride 98 - 111 mmol/L 104 107 105  CO2 22 - 32 mmol/L '26 25 26  '$ Calcium 8.9 - 10.3 mg/dL 10.0 9.2 8.6(L)  Total Protein 6.5 - 8.1 g/dL 6.9 6.4(L) 6.4(L)  Total Bilirubin 0.3 - 1.2 mg/dL 0.3 0.3 0.3  Alkaline Phos 38 - 126 U/L 102 97 82  AST 15 - 41 U/L '26 26 20  '$ ALT 0 - 44 U/L '18 17 9      '$ RADIOGRAPHIC STUDIES: I have personally reviewed the radiological images as listed and agreed with the findings in the report. No results found.   ASSESSMENT & PLAN:  Nicole Sellers is a 68 y.o. female with   1. Adenocarcinoma of the rectosigmoid colon, moderately differentiated,cT3N1M0, stage IIIB,MMR normal -Diagnosed in 09/2017.She had complete neoadjuvantchemo and radiation, including 4 months ofFOLFOXwithUdenyca, followed by concurrent chemoRT with Xeloda'1500mg'$  BID, which she completed on 05/17/18. -she has recovered well from treatment  -she is not sure if she would proceed surgery, which is scheduled for 08/03/2018. I recommend her to have a sigmoidoscopy to see if she still has residual disease.  If she has achieved complete response, no evidence of residual disease on the scope, she can consider wait and watch approach, and consider surgery only if she has recurrence. However if she does have residual cancer after chemo and radiation, she should proceed with surgical resection.  She is interested in this approach, and agrees to see her GI Dr. Collene Mares in the next few weeks.  -I will f/u with her in 2 months   2. Iron deficiency anemia, secondary to #1 -She is currently on2oral ironpills a day.Will continue. -She previously declined IV iron. -Hgat10today (07/09/18)   3. Depression -Due to her diagnosis and treatment. -Currently on mirtazapine. -Her mood has been stable.  4. Diarrhea -She initially complained of constipation that has changed to diarrheaafter chemo. -Diarrhea has returned due to Xeloda and radiationand now significant.  -I instructed her to take  Imodium twice at first sign of diarrhea and then every 3 hours or she can alternate with lomotil and take up to 8 tabs each daily.  -  Now that she is off treatment this is improving. Diarrhea resolving.   5.HTN, DM -Continue amlodipine andMaxzide. I recommend her to monitor BP at home. We discussed holdingMaxzideif her blood pressurebecomes low due to diarrhea -BP elevated lately, at159/79today (07/09/18).  6. Peripheral neuropathy, G2  -Secondary to DM and prior chemo  -She will continue Gabapentin and B12.  -Her numbness of feet and swelling is improving. The numbness of her fingers is stable.   Plan -she will see Dr. Collene Mares and have sigmoidoscopy to see if she still has residual cancer, then make a decision on surgery, which is scheduled for 7/24 -Lab and f/u in 2 months     No problem-specific Assessment & Plan notes found for this encounter.   No orders of the defined types were placed in this encounter.  All questions were answered. The patient knows to call the clinic with any problems, questions or concerns. No barriers to learning was detected. I spent 20 minutes counseling the patient face to face. The total time spent in the appointment was 25 minutes and more than 50% was on counseling and review of test results     Truitt Merle, MD 07/09/2018   I, Joslyn Devon, am acting as scribe for Truitt Merle, MD.   I have reviewed the above documentation for accuracy and completeness, and I agree with the above.

## 2018-07-09 ENCOUNTER — Other Ambulatory Visit: Payer: Self-pay

## 2018-07-09 ENCOUNTER — Inpatient Hospital Stay: Payer: Medicare Other

## 2018-07-09 ENCOUNTER — Telehealth: Payer: Self-pay | Admitting: Hematology

## 2018-07-09 ENCOUNTER — Inpatient Hospital Stay (HOSPITAL_BASED_OUTPATIENT_CLINIC_OR_DEPARTMENT_OTHER): Payer: Medicare Other | Admitting: Hematology

## 2018-07-09 ENCOUNTER — Encounter: Payer: Self-pay | Admitting: Hematology

## 2018-07-09 VITALS — BP 159/79 | HR 76 | Temp 98.7°F | Resp 20 | Ht 67.0 in | Wt 160.3 lb

## 2018-07-09 DIAGNOSIS — E119 Type 2 diabetes mellitus without complications: Secondary | ICD-10-CM

## 2018-07-09 DIAGNOSIS — R197 Diarrhea, unspecified: Secondary | ICD-10-CM | POA: Diagnosis not present

## 2018-07-09 DIAGNOSIS — F17210 Nicotine dependence, cigarettes, uncomplicated: Secondary | ICD-10-CM

## 2018-07-09 DIAGNOSIS — C20 Malignant neoplasm of rectum: Secondary | ICD-10-CM

## 2018-07-09 DIAGNOSIS — C189 Malignant neoplasm of colon, unspecified: Secondary | ICD-10-CM

## 2018-07-09 DIAGNOSIS — D509 Iron deficiency anemia, unspecified: Secondary | ICD-10-CM

## 2018-07-09 DIAGNOSIS — K219 Gastro-esophageal reflux disease without esophagitis: Secondary | ICD-10-CM

## 2018-07-09 DIAGNOSIS — Z9221 Personal history of antineoplastic chemotherapy: Secondary | ICD-10-CM

## 2018-07-09 DIAGNOSIS — I2510 Atherosclerotic heart disease of native coronary artery without angina pectoris: Secondary | ICD-10-CM

## 2018-07-09 DIAGNOSIS — F329 Major depressive disorder, single episode, unspecified: Secondary | ICD-10-CM | POA: Diagnosis not present

## 2018-07-09 DIAGNOSIS — G629 Polyneuropathy, unspecified: Secondary | ICD-10-CM

## 2018-07-09 DIAGNOSIS — Z79899 Other long term (current) drug therapy: Secondary | ICD-10-CM

## 2018-07-09 DIAGNOSIS — Z7982 Long term (current) use of aspirin: Secondary | ICD-10-CM

## 2018-07-09 DIAGNOSIS — Z7984 Long term (current) use of oral hypoglycemic drugs: Secondary | ICD-10-CM

## 2018-07-09 DIAGNOSIS — E039 Hypothyroidism, unspecified: Secondary | ICD-10-CM

## 2018-07-09 DIAGNOSIS — E7800 Pure hypercholesterolemia, unspecified: Secondary | ICD-10-CM

## 2018-07-09 DIAGNOSIS — Z923 Personal history of irradiation: Secondary | ICD-10-CM

## 2018-07-09 DIAGNOSIS — Z8719 Personal history of other diseases of the digestive system: Secondary | ICD-10-CM

## 2018-07-09 DIAGNOSIS — I10 Essential (primary) hypertension: Secondary | ICD-10-CM

## 2018-07-09 LAB — CMP (CANCER CENTER ONLY)
ALT: 18 U/L (ref 0–44)
AST: 26 U/L (ref 15–41)
Albumin: 3.6 g/dL (ref 3.5–5.0)
Alkaline Phosphatase: 102 U/L (ref 38–126)
Anion gap: 9 (ref 5–15)
BUN: 16 mg/dL (ref 8–23)
CO2: 26 mmol/L (ref 22–32)
Calcium: 10 mg/dL (ref 8.9–10.3)
Chloride: 104 mmol/L (ref 98–111)
Creatinine: 0.95 mg/dL (ref 0.44–1.00)
GFR, Est AFR Am: >60 mL/min (ref >60)
GFR, Estimated: >60 mL/min (ref >60)
Glucose, Bld: 104 mg/dL — ABNORMAL HIGH (ref 70–99)
Potassium: 4 mmol/L (ref 3.5–5.1)
Sodium: 139 mmol/L (ref 135–145)
Total Bilirubin: 0.3 mg/dL (ref 0.3–1.2)
Total Protein: 6.9 g/dL (ref 6.5–8.1)

## 2018-07-09 LAB — CBC WITH DIFFERENTIAL (CANCER CENTER ONLY)
Abs Immature Granulocytes: 0.01 10*3/uL (ref 0.00–0.07)
Basophils Absolute: 0 10*3/uL (ref 0.0–0.1)
Basophils Relative: 1 %
Eosinophils Absolute: 0.2 10*3/uL (ref 0.0–0.5)
Eosinophils Relative: 6 %
HCT: 30.1 % — ABNORMAL LOW (ref 36.0–46.0)
Hemoglobin: 10 g/dL — ABNORMAL LOW (ref 12.0–15.0)
Immature Granulocytes: 0 %
Lymphocytes Relative: 17 %
Lymphs Abs: 0.7 10*3/uL (ref 0.7–4.0)
MCH: 31.7 pg (ref 26.0–34.0)
MCHC: 33.2 g/dL (ref 30.0–36.0)
MCV: 95.6 fL (ref 80.0–100.0)
Monocytes Absolute: 0.3 10*3/uL (ref 0.1–1.0)
Monocytes Relative: 8 %
Neutro Abs: 2.6 10*3/uL (ref 1.7–7.7)
Neutrophils Relative %: 68 %
Platelet Count: 282 10*3/uL (ref 150–400)
RBC: 3.15 MIL/uL — ABNORMAL LOW (ref 3.87–5.11)
RDW: 12.4 % (ref 11.5–15.5)
WBC Count: 3.9 10*3/uL — ABNORMAL LOW (ref 4.0–10.5)
nRBC: 0 % (ref 0.0–0.2)

## 2018-07-09 LAB — IRON AND TIBC
Iron: 64 ug/dL (ref 41–142)
Saturation Ratios: 22 % (ref 21–57)
TIBC: 296 ug/dL (ref 236–444)
UIBC: 232 ug/dL (ref 120–384)

## 2018-07-09 LAB — CEA (IN HOUSE-CHCC): CEA (CHCC-In House): 6.09 ng/mL — ABNORMAL HIGH (ref 0.00–5.00)

## 2018-07-09 LAB — FERRITIN: Ferritin: 123 ng/mL (ref 11–307)

## 2018-07-09 MED ORDER — HEPARIN SOD (PORK) LOCK FLUSH 100 UNIT/ML IV SOLN
500.0000 [IU] | Freq: Once | INTRAVENOUS | Status: AC | PRN
  Administered 2018-07-09: 500 [IU]
  Filled 2018-07-09: qty 5

## 2018-07-09 MED ORDER — SODIUM CHLORIDE 0.9% FLUSH
10.0000 mL | Freq: Once | INTRAVENOUS | Status: AC | PRN
  Administered 2018-07-09: 10 mL
  Filled 2018-07-09: qty 10

## 2018-07-09 NOTE — Telephone Encounter (Signed)
Scheduled appt per 6/29 los. Spoke with patient and she is aware of her appt date and time.

## 2018-07-10 ENCOUNTER — Encounter: Payer: Self-pay | Admitting: Hematology

## 2018-07-11 ENCOUNTER — Telehealth: Payer: Self-pay

## 2018-07-11 NOTE — Telephone Encounter (Signed)
-----   Message from Truitt Merle, MD sent at 07/10/2018 11:28 PM EDT ----- Please fax my last office note to her GI Dr. Collene Mares, and request sigmoidoscopy for her in the next few weeks.    Thanks  [name]

## 2018-07-11 NOTE — Telephone Encounter (Signed)
Faxed copy of OV note from 07/09/2018 and request for Dr. Collene Mares to do sigmoidoscopy in the next few weeks.

## 2018-07-30 ENCOUNTER — Other Ambulatory Visit: Payer: Self-pay | Admitting: Nurse Practitioner

## 2018-07-30 DIAGNOSIS — E039 Hypothyroidism, unspecified: Secondary | ICD-10-CM

## 2018-07-30 DIAGNOSIS — E119 Type 2 diabetes mellitus without complications: Secondary | ICD-10-CM

## 2018-07-31 ENCOUNTER — Encounter (HOSPITAL_COMMUNITY): Payer: Self-pay

## 2018-07-31 ENCOUNTER — Other Ambulatory Visit (HOSPITAL_COMMUNITY): Payer: Medicare Other

## 2018-07-31 NOTE — Patient Instructions (Addendum)
DUE TO COVID-19 ONLY ONE VISITOR IS ALLOWED IN THE HOSPITAL AT THIS TIME   COVID SWAB TESTING MUST BE COMPLETED ON: August 01, 2018 at 11:15AM  (Must self quarantine after testing. Follow instructions on handout.)   Your procedure is scheduled on: August 03, 2018   Report to [facility] Main  Entrance   Report to Short Stay at 5:30 AM   Call this number if you have problems the morning of surgery [phone]   Do not eat food:After Midnight.   May have liquids until 4:30AM day of surgery   CLEAR LIQUID DIET  Foods Allowed                                                                     Foods Excluded  Water, Black Coffee and tea, regular and decaf                             liquids that you cannot  Plain Jell-O no red or purple  flavor                                           see through such as: Fruit ices (not with fruit pulp)                                     milk, soups, orange juice  Iced Popsicles                                    All solid food Carbonated beverages, regular and diet                                    Cranberry, grape and apple juices Sports drinks like Gatorade Lightly seasoned clear broth or consume(fat free) Sugar, honey syrup  Sample Menu Breakfast                                Lunch                                     Supper Cranberry juice                    Beef broth                            Chicken broth Jell-O                                     Grape juice  Apple juice Coffee or tea                        Jell-O                                      Popsicle                                                Coffee or tea                        Coffee or tea   Drink 2 G2 drinks the night before surgery at 1000 pm.  Follow all bowel prep instructions from dr Marcello Moores. Clear liquids all day Thursday 08-02-2018.    Complete one G2 drink the morning of surgery at 4:30AM.   Brush your teeth the morning of  surgery.   Do NOT smoke after Midnight   Take these medicines the morning of surgery with A SIP OF WATER: albuterol inhaler if needed and bring inhaler, gabapentin (neurontin), certrizine (zyrtec), atorvastatin (lipitor), amlodipine (norvasc), levothyroxine (synthroid)  DO NOT TAKE ANY DIABETIC MEDICATIONS DAY OF YOUR SURGERY                               You may not have any metal on your body including hair pins, jewelry, and body piercings             Do not wear make-up, lotions, powders, perfumes/cologne, or deodorant             Do not wear nail polish.  Do not shave  48 hours prior to surgery.               Do not bring valuables to the hospital. [place].   Contacts, dentures or bridgework may not be worn into surgery.   Bring small overnight bag day of surgery.   Special Instructions: Bring a copy of your healthcare power of attorney and living will documents         the day of surgery if you haven't scanned them in before.              Please read over the following fact sheets you were given:  [facility] - Preparing for Surgery Before surgery, you can play an important role.  Because skin is not sterile, your skin needs to be as free of germs as possible.  You can reduce the number of germs on your skin by washing with CHG (chlorahexidine gluconate) soap before surgery.  CHG is an antiseptic cleaner which kills germs and bonds with the skin to continue killing germs even after washing. Please DO NOT use if you have an allergy to CHG or antibacterial soaps.  If your skin becomes reddened/irritated stop using the CHG and inform your nurse when you arrive at Short Stay. Do not shave (including legs and underarms) for at least 48 hours prior to the first CHG shower.  You may shave your face/neck.  Please follow these instructions carefully:  1.  Shower  with CHG Soap the night before surgery and the  morning of surgery.  2.  If  you choose to wash your hair, wash your hair first as usual with your normal  shampoo.  3.  After you shampoo, rinse your hair and body thoroughly to remove the shampoo.                             4.  Use CHG as you would any other liquid soap.  You can apply chg directly to the skin and wash.  Gently with a scrungie or clean washcloth.  5.  Apply the CHG Soap to your body ONLY FROM THE NECK DOWN.   Do   not use on face/ open                           Wound or open sores. Avoid contact with eyes, ears mouth and   genitals (private parts).                       Wash face,  Genitals (private parts) with your normal soap.             6.  Wash thoroughly, paying special attention to the area where your    surgery  will be performed.  7.  Thoroughly rinse your body with warm water from the neck down.  8.  DO NOT shower/wash with your normal soap after using and rinsing off the CHG Soap.                9.  Pat yourself dry with a clean towel.            10.  Wear clean pajamas.            11.  Place clean sheets on your bed the night of your first shower and do not  sleep with pets. Day of Surgery : Do not apply any lotions/deodorants the morning of surgery.  Please wear clean clothes to the hospital/surgery center.  FAILURE TO FOLLOW THESE INSTRUCTIONS MAY RESULT IN THE CANCELLATION OF YOUR SURGERY  PATIENT SIGNATURE_________________________________  NURSE SIGNATURE__________________________________  ________________________________________________________________________   Adam Phenix  An incentive spirometer is a tool that can help keep your lungs clear and active. This tool measures how well you are filling your lungs with each breath. Taking long deep breaths may help reverse or decrease the chance of developing breathing (pulmonary) problems (especially infection) following:  A long period of time when you are unable to move or be active. BEFORE THE PROCEDURE   If the  spirometer includes an indicator to show your best effort, your nurse or respiratory therapist will set it to a desired goal.  If possible, sit up straight or lean slightly forward. Try not to slouch.  Hold the incentive spirometer in an upright position. INSTRUCTIONS FOR USE  1. Sit on the edge of your bed if possible, or sit up as far as you can in bed or on a chair. 2. Hold the incentive spirometer in an upright position. 3. Breathe out normally. 4. Place the mouthpiece in your mouth and seal your lips tightly around it. 5. Breathe in slowly and as deeply as possible, raising the piston or the ball toward the top of the column. 6. Hold your breath for 3-5 seconds or for as long as possible. Allow the piston or  ball to fall to the bottom of the column. 7. Remove the mouthpiece from your mouth and breathe out normally. 8. Rest for a few seconds and repeat Steps 1 through 7 at least 10 times every 1-2 hours when you are awake. Take your time and take a few normal breaths between deep breaths. 9. The spirometer may include an indicator to show your best effort. Use the indicator as a goal to work toward during each repetition. 10. After each set of 10 deep breaths, practice coughing to be sure your lungs are clear. If you have an incision (the cut made at the time of surgery), support your incision when coughing by placing a pillow or rolled up towels firmly against it. Once you are able to get out of bed, walk around indoors and cough well. You may stop using the incentive spirometer when instructed by your caregiver.  RISKS AND COMPLICATIONS  Take your time so you do not get dizzy or light-headed.  If you are in pain, you may need to take or ask for pain medication before doing incentive spirometry. It is harder to take a deep breath if you are having pain. AFTER USE  Rest and breathe slowly and easily.  It can be helpful to keep track of a log of your progress. Your caregiver can provide  you with a simple table to help with this. If you are using the spirometer at home, follow these instructions: [place] IF:   You are having difficultly using the spirometer.  You have trouble using the spirometer as often as instructed.  Your pain medication is not giving enough relief while using the spirometer.  You develop fever of 100.5 F (38.1 C) or higher. SEEK IMMEDIATE MEDICAL CARE IF:   You cough up bloody sputum that had not been present before.  You develop fever of 102 F (38.9 C) or greater.  You develop worsening pain at or near the incision site. MAKE SURE YOU:   Understand these instructions.  Will watch your condition.  Will get help right away if you are not doing well or get worse. Document Released: 05/09/2006 Document Revised: 03/21/2011 Document Reviewed: 07/10/2006 ExitCare Patient Information 2014 ExitCare, [place].   ________________________________________________________________________ How to Manage Your Diabetes Before and After Surgery  Why is it important to control my blood sugar before and after surgery? . Improving blood sugar levels before and after surgery helps healing and can limit problems. . A way of improving blood sugar control is eating a healthy diet by: o  Eating less sugar and carbohydrates o  Increasing activity/exercise o  Talking with your doctor about reaching your blood sugar goals . High blood sugars (greater than 180 mg/dL) can raise your risk of infections and slow your recovery, so you will need to focus on controlling your diabetes during the weeks before surgery. . Make sure that the doctor who takes care of your diabetes knows about your planned surgery including the date and location.  How do I manage my blood sugar before surgery? . Check your blood sugar at least 4 times a day, starting 2 days before surgery, to make sure that the level is not too high or low. o Check your blood sugar the morning of  your surgery when you wake up and every 2 hours until you get to the Short Stay unit. . If your blood sugar is less than 70 mg/dL, you will need to treat for low blood sugar: o Do not  take insulin. o Treat a low blood sugar (less than 70 mg/dL) with  cup of clear juice (cranberry or apple), 4 glucose tablets, OR glucose gel. o Recheck blood sugar in 15 minutes after treatment (to make sure it is greater than 70 mg/dL). If your blood sugar is not greater than 70 mg/dL on recheck, call [phone] for further instructions. . Report your blood sugar to the short stay nurse when you get to Short Stay.  . If you are admitted to the hospital after surgery: o Your blood sugar will be checked by the staff and you will probably be given insulin after surgery (instead of oral diabetes medicines) to make sure you have good blood sugar levels. o The goal for blood sugar control after surgery is 80-180 mg/dL.   WHAT DO I DO ABOUT MY DIABETES MEDICATION?  Marland Kitchen Do not take oral diabetes medicines (pills) the morning of surgery.  . THE  DAY BEFORE SURGERY, TAKE ONLY MORNING DOSE OF GLIMPERIDE     . THE DAY BEFORE  SURGERY TAKE METFORMIN AS USUAL.   .  THE DAY OF SURGERY DO NOT TAKE GLIMPERIDE OR METFORMIN.   Reviewed and Endorsed by [facility] Patient Education Committee, August 2015

## 2018-07-31 NOTE — Pre-Procedure Instructions (Signed)
The following are in epic: Cardiac Clearance and last office visit note Kilroy P.A. 06/07/2018 EKG 06/07/2018 Stress test 06/14/2018 CXR 11/22/2017

## 2018-08-01 ENCOUNTER — Encounter (HOSPITAL_COMMUNITY): Payer: Self-pay

## 2018-08-01 ENCOUNTER — Encounter (HOSPITAL_COMMUNITY)
Admission: RE | Admit: 2018-08-01 | Discharge: 2018-08-01 | Disposition: A | Discharge status: Home / Self Care | Payer: Medicare Other | Source: Ambulatory Visit | Attending: General Surgery | Admitting: General Surgery

## 2018-08-01 ENCOUNTER — Other Ambulatory Visit (HOSPITAL_COMMUNITY)
Admission: RE | Admit: 2018-08-01 | Discharge: 2018-08-01 | Disposition: A | Discharge status: Home / Self Care | Payer: Medicare Other | Source: Ambulatory Visit | Attending: General Surgery | Admitting: General Surgery

## 2018-08-01 ENCOUNTER — Other Ambulatory Visit: Payer: Self-pay

## 2018-08-01 DIAGNOSIS — Z1159 Encounter for screening for other viral diseases: Secondary | ICD-10-CM | POA: Insufficient documentation

## 2018-08-01 DIAGNOSIS — Z01812 Encounter for preprocedural laboratory examination: Secondary | ICD-10-CM | POA: Insufficient documentation

## 2018-08-01 HISTORY — DX: Malignant neoplasm of rectum: C20

## 2018-08-01 HISTORY — DX: Diverticulosis of intestine, part unspecified, without perforation or abscess without bleeding: K57.90

## 2018-08-01 HISTORY — DX: Polyneuropathy, unspecified: G62.9

## 2018-08-01 HISTORY — DX: Age-related nuclear cataract, right eye: H25.11

## 2018-08-01 LAB — CBC
HCT: 33.8 % — ABNORMAL LOW (ref 36.0–46.0)
Hemoglobin: 10.4 g/dL — ABNORMAL LOW (ref 12.0–15.0)
MCH: 30.6 pg (ref 26.0–34.0)
MCHC: 30.8 g/dL (ref 30.0–36.0)
MCV: 99.4 fL (ref 80.0–100.0)
Platelets: 301 10*3/uL (ref 150–400)
RBC: 3.4 MIL/uL — ABNORMAL LOW (ref 3.87–5.11)
RDW: 12.3 % (ref 11.5–15.5)
WBC: 3.3 10*3/uL — ABNORMAL LOW (ref 4.0–10.5)
nRBC: 0 % (ref 0.0–0.2)

## 2018-08-01 LAB — BASIC METABOLIC PANEL
Anion gap: 10 (ref 5–15)
BUN: 16 mg/dL (ref 8–23)
CO2: 27 mmol/L (ref 22–32)
Calcium: 9.7 mg/dL (ref 8.9–10.3)
Chloride: 104 mmol/L (ref 98–111)
Creatinine, Ser: 0.87 mg/dL (ref 0.44–1.00)
GFR calc Af Amer: >60 mL/min (ref >60)
GFR calc non Af Amer: >60 mL/min (ref >60)
Glucose, Bld: 126 mg/dL — ABNORMAL HIGH (ref 70–99)
Potassium: 4.5 mmol/L (ref 3.5–5.1)
Sodium: 141 mmol/L (ref 135–145)

## 2018-08-01 LAB — SARS CORONAVIRUS 2 (TAT 6-24 HRS): SARS Coronavirus 2: NEGATIVE

## 2018-08-01 LAB — HEMOGLOBIN A1C
Hgb A1c MFr Bld: 6 % — ABNORMAL HIGH (ref 4.8–5.6)
Mean Plasma Glucose: 125.5 mg/dL

## 2018-08-01 LAB — NO BLOOD PRODUCTS

## 2018-08-01 LAB — GLUCOSE, CAPILLARY: Glucose-Capillary: 138 mg/dL — ABNORMAL HIGH (ref 70–99)

## 2018-08-01 NOTE — Consult Note (Signed)
[place] Nurse ostomy consult note  [place] Nurse requested for preoperative stoma site marking by Dr. Marcello Moores.  Discussed surgical procedure and stoma creation with patient.  Explained role of the [place] nurse team.  Patient reiterates she hopes she does not get an ostomy.   Answered patient questions.   Examined patient sitting in wheelchair (she is not wheelchair bound) in order to place the marking in the patient's visual field, away from any creases or abdominal contour issues and within the rectus muscle.    Marked for colostomy in the LUQ  6 cm to the left of the umbilicus and  4 cm above the umbilicus.  Marked for ileostomy in the RUQ  7 cm to the right of the umbilicus and  5 cm above the umbilicus.  Patient's abdomen cleansed with CHG wipes at site markings, allowed to air dry prior to marking.Covered marks with thin film transparent dressing to preserve marks until date of surgery.   [place] Nurse team will follow up with patient after surgery for continue ostomy care and teaching should a stoma be created intraoperatively.   Thank you, Maudie Flakes, MSN, RN, Chancy Milroy, Arther Abbott  Pager# [phone]

## 2018-08-01 NOTE — Progress Notes (Addendum)
PCP: Reynold Bowen  CARDIOLOGIST:  Cardiac  Clearance luke kilroy pa 06-07-18   INFO IN Epic:ekg 06-07-18, stress test 6-4-2-, chest xray 11-22-17 lov luke kilroy pa 06-07-18  INFO ON CHART:  BLOOD THINNERS AND LAST DOSES: off plavix x 5 moths, (md aware), staying on 81 mg aspirin per dr Marcello Moores office instructions ____________________________________  PATIENT SYMPTOMS AT TIME OF PREOP:

## 2018-08-01 NOTE — Progress Notes (Signed)
Spoke with wendy at [facility], asked her to make dr Marcello Moores aware consent needs umbilical hernia repair added.

## 2018-08-01 NOTE — Progress Notes (Signed)
Faxed blood refusal consent to dr Marcello Moores office and [facility]. Fax confirmation received and placed on chart

## 2018-08-02 MED ORDER — BUPIVACAINE LIPOSOME 1.3 % IJ SUSP
20.0000 mL | Freq: Once | INTRAMUSCULAR | Status: DC
  Filled 2018-08-02: qty 20

## 2018-08-02 NOTE — Progress Notes (Signed)
Anesthesia Chart Review   Case: [id number] Date/Time: 08/03/18 0700   Procedures:      XI ROBOTIC ASSISTED LOWER ANTERIOR RESECTION (N/A )     POSSIBLE OSTOMY (N/A )     UMBILICAL HERNIA REPAIR (N/A )   Anesthesia type: General   Pre-op diagnosis: RECTAL CANCER   [facility] ROOM 02 / [facility]   Surgeon: Leighton Ruff, MD      QPRFFMBWGY:68 y.o. former smoker (24 pack years) with h/o HTN, DM II, PAD (s/p RSFA PTA 12/2012, Rt EIA PTA 05/08/2017), hypothyroidism, GERD, rectal cancer scheduled for above procedure 9/93/5701 with Dr. Leighton Ruff.   Cardiac clearance received 07/02/2018.  Per Daune Perch, NP, "Given past medical history and time since last visit, based on ACC/AHA guidelines, Nicole Sellers had a low risk myoview on 06/15/2018 and would be at acceptable risk for the planned procedure without further cardiovascular testing. OK to hold aspirin and Plavix as needed for procedure."  Anticipate pt can proceed with planned procedure barring acute status change.   VS: BP (!) 155/75 (BP Location: Right Arm)   Pulse 84   Temp 37.1 C (Oral)   Resp 18   Ht 5\' 6"  (1.676 m)   Wt 74 kg   SpO2 100%   BMI 26.34 kg/m   PROVIDERS: Reynold Bowen, MD is PCP    LABS: Labs reviewed: Acceptable for surgery. (all labs ordered are listed, but only abnormal results are displayed)  Labs Reviewed  GLUCOSE, CAPILLARY - Abnormal; Notable for the following components:      Result Value   Glucose-Capillary 138 (*)    All other components within normal limits  HEMOGLOBIN A1C - Abnormal; Notable for the following components:   Hgb A1c MFr Bld 6.0 (*)    All other components within normal limits  BASIC METABOLIC PANEL - Abnormal; Notable for the following components:   Glucose, Bld 126 (*)    All other components within normal limits  CBC - Abnormal; Notable for the following components:   WBC 3.3 (*)    RBC 3.40 (*)    Hemoglobin 10.4 (*)    HCT 33.8 (*)    All other components within  normal limits  NO BLOOD PRODUCTS     IMAGES: CT Abdomen Pelvis 01/17/2018 IMPRESSION: 1. Previously seen soft tissue mass involving the left lateral wall the upper rectum is no longer visualized. 2. Decreased size of left perirectal lymph nodes since prior exam. 3. No other sites of metastatic disease within the abdomen or pelvis. 4. Colonic diverticulosis, without radiographic evidence of diverticulitis. 5. Stable small adjacent paraumbilical hernias containing only fat.  EKG: 06/07/2018 Rate 74 bpm Normal sinus rhythm   CV: Myocardial Perfusion Imaging 06/14/2018  Nuclear stress EF: 56%.  The left ventricular ejection fraction is normal (55-65%).  There was no ST segment deviation noted during stress.  No T wave inversion was noted during stress.  Defect 1: There is a small defect of mild severity present in the basal anterior location.  The study is normal.  This is a low risk study.   Low risk stress nuclear study with normal perfusion and normal left ventricular regional and global systolic function. Past Medical History:  Diagnosis Date  . Age-related nuclear cataract, right eye   . Allergy   . Anemia   . Arthritis    "joints sometimes" (12/25/2012)  . Clotting disorder ([place])   . Colon cancer ([place])   . Critical lower limb ischemia   .  Diverticulosis   . Gangrene of toe, Rt second toe 12/25/2012  . GERD (gastroesophageal reflux disease)   . High cholesterol   . Hypertension   . Hypothyroidism   . PAD (peripheral artery disease) ([place])   . PAD (peripheral artery disease) ([place])   . Peripheral neuropathy   . Rectal cancer ([place])   . S/P arterial stent, 12/25/13, successful diamondback orbital rotational arthrectomy, PTA using chocolate  balloon and stenting using I DEV stent of long segment calcified high-grade proximal and mid r 12/25/2012  . Tobacco abuse   . Type II diabetes mellitus ([place])     Past Surgical History:  Procedure Laterality Date  .  ABDOMINAL AORTAGRAM  12/20/2012   Procedure: ABDOMINAL AORTAGRAM;  Surgeon: Lorretta Harp, MD;  Location: [facility] CATH LAB;  Service: Cardiovascular;;  . ABDOMINAL HYSTERECTOMY  2000  . ANGIOPLASTY / STENTING FEMORAL Right 12/25/2012  . ATHERECTOMY Right 12/25/2012   Procedure: ATHERECTOMY;  Surgeon: Lorretta Harp, MD;  Location: [facility] CATH LAB;  Service: Cardiovascular;  Laterality: Right;  right SFA  . COLONOSCOPY    . EYE SURGERY Left    cataract  . HERNIA REPAIR    . LOWER EXTREMITY ANGIOGRAM N/A 12/20/2012   Procedure: LOWER EXTREMITY ANGIOGRAM;  Surgeon: Lorretta Harp, MD;  Location: [facility] CATH LAB;  Service: Cardiovascular;  Laterality: N/A;  . LOWER EXTREMITY ANGIOGRAM N/A 12/25/2012   Procedure: LOWER EXTREMITY ANGIOGRAM;  Surgeon: Lorretta Harp, MD;  Location: [facility] LAB;  Service: Cardiovascular;  Laterality: N/A;  . LOWER EXTREMITY INTERVENTION  05/08/2017  . LOWER EXTREMITY INTERVENTION Bilateral 05/08/2017   Procedure: LOWER EXTREMITY INTERVENTION;  Surgeon: Lorretta Harp, MD;  Location: [facility] CV LAB;  Service: Cardiovascular;  Laterality: Bilateral;  . PERIPHERAL VASCULAR BALLOON ANGIOPLASTY Right 05/08/2017   Procedure: PERIPHERAL VASCULAR BALLOON ANGIOPLASTY;  Surgeon: Lorretta Harp, MD;  Location: [place] CV LAB;  Service: Cardiovascular;  Laterality: Right;  sfa  . PERIPHERAL VASCULAR INTERVENTION Right 05/08/2017   Procedure: PERIPHERAL VASCULAR INTERVENTION;  Surgeon: Lorretta Harp, MD;  Location: [place] CV LAB;  Service: Cardiovascular;  Laterality: Right;  ext iliac  . PORTACATH PLACEMENT Right 11/22/2017   Procedure: INSERTION PORT-A-CATH;  Surgeon: Leighton Ruff, MD;  Location[facility]S;  Service: General;  Laterality: Right;  . TOE SURGERY Right    2n toe   . UMBILICAL HERNIA REPAIR  2000    MEDICATIONS: . acetaminophen (TYLENOL) 500 MG tablet  . albuterol (PROVENTIL HFA;VENTOLIN HFA) 108 (90 BASE) MCG/ACT inhaler  . amLODipine  (NORVASC) 5 MG tablet  . aspirin 81 MG tablet  . atorvastatin (LIPITOR) 10 MG tablet  . Calcium Carbonate-Vitamin D (CALCIUM 600+D PO)  . cetirizine (ZYRTEC) 10 MG tablet  . cholecalciferol (VITAMIN D) 1000 units tablet  . clopidogrel (PLAVIX) 75 MG tablet  . diphenoxylate-atropine (LOMOTIL) 2.5-0.025 MG tablet  . FERREX 150 150 MG capsule  . gabapentin (NEURONTIN) 300 MG capsule  . glimepiride (AMARYL) 1 MG tablet  . levothyroxine (SYNTHROID, LEVOTHROID) 50 MCG tablet  . lidocaine-prilocaine (EMLA) cream  . magic mouthwash w/lidocaine SOLN  . metFORMIN (GLUCOPHAGE-XR) 500 MG 24 hr tablet  . mirtazapine (REMERON) 7.5 MG tablet  . Misc Natural Products (OSTEO BI-FLEX ADV TRIPLE ST) TABS  . Multiple Vitamin (MULTIVITAMIN) capsule  . ondansetron (ZOFRAN) 8 MG tablet  . prochlorperazine (COMPAZINE) 10 MG tablet  . tetrahydrozoline-zinc (VISINE-AC) 0.05-0.25 % ophthalmic solution  . Triamcinolone Acetonide (NASACORT ALLERGY 24HR NA)  . triamterene-hydrochlorothiazide (MAXZIDE) 75-50  MG tablet  . Trolamine Salicylate (ASPERCREME EX)  . vitamin B-12 (CYANOCOBALAMIN) 500 MCG tablet   No current facility-administered medications for this encounter.    Derrill Memo ON 08/03/2018] bupivacaine liposome (EXPAREL) 1.3 % injection 266 mg  . heparin lock flush 100 unit/mL  . sodium chloride flush (NS) 0.9 % injection 10 mL   Maia Plan [facility] Pre-Surgical Testing [phone] 08/02/18  10:16 AM

## 2018-08-02 NOTE — Anesthesia Preprocedure Evaluation (Addendum)
Anesthesia Evaluation  Patient identified by MRN, date of birth, ID band Patient awake    Reviewed: Allergy & Precautions, H&P , NPO status , Patient's Chart, lab work & pertinent test results  Airway Mallampati: II  TM Distance: >3 FB Neck ROM: Full    Dental no notable dental hx. (+) Edentulous Upper, Edentulous Lower, Dental Advisory Given   Pulmonary neg pulmonary ROS, former smoker,    Pulmonary exam normal breath sounds clear to auscultation       Cardiovascular Exercise Tolerance: Good hypertension, Pt. on medications + Peripheral Vascular Disease   Rhythm:Regular Rate:Normal     Neuro/Psych negative neurological ROS  negative psych ROS   GI/Hepatic Neg liver ROS, GERD  Controlled,  Endo/Other  diabetes, Type 2, Oral Hypoglycemic AgentsHypothyroidism   Renal/GU negative Renal ROS  negative genitourinary   Musculoskeletal  (+) Arthritis , Osteoarthritis,    Abdominal   Peds  Hematology  (+) Blood dyscrasia, anemia ,   Anesthesia Other Findings   Reproductive/Obstetrics negative OB ROS                           Anesthesia Physical Anesthesia Plan  ASA: III  Anesthesia Plan: General   Post-op Pain Management:    Induction: Intravenous  PONV Risk Score and Plan: 4 or greater and Ondansetron, Dexamethasone and Midazolam  Airway Management Planned: Oral ETT  Additional Equipment:   Intra-op Plan:   Post-operative Plan: Extubation in OR  Informed Consent: I have reviewed the patients History and Physical, chart, labs and discussed the procedure including the risks, benefits and alternatives for the proposed anesthesia with the patient or authorized representative who has indicated his/her understanding and acceptance.     Dental advisory given  Plan Discussed with: CRNA  Anesthesia Plan Comments: (See PAT note 08/01/2018, Konrad Felix, PA-C)       Anesthesia  Quick Evaluation

## 2018-08-03 ENCOUNTER — Inpatient Hospital Stay (HOSPITAL_COMMUNITY)
Admission: RE | Admit: 2018-08-03 | Discharge: 2018-08-09 | DRG: 331 | Disposition: A | Discharge status: Home Health | Payer: Medicare Other | Attending: General Surgery | Admitting: General Surgery

## 2018-08-03 ENCOUNTER — Inpatient Hospital Stay (HOSPITAL_COMMUNITY): Payer: Medicare Other | Admitting: Anesthesiology

## 2018-08-03 ENCOUNTER — Encounter (HOSPITAL_COMMUNITY)
Admission: RE | Disposition: A | Discharge status: Home / Self Care | Payer: Self-pay | Source: Home / Self Care | Attending: General Surgery

## 2018-08-03 ENCOUNTER — Other Ambulatory Visit: Payer: Self-pay

## 2018-08-03 ENCOUNTER — Inpatient Hospital Stay (HOSPITAL_COMMUNITY): Payer: Medicare Other | Admitting: Physician Assistant

## 2018-08-03 ENCOUNTER — Encounter (HOSPITAL_COMMUNITY): Payer: Self-pay | Admitting: *Deleted

## 2018-08-03 DIAGNOSIS — K219 Gastro-esophageal reflux disease without esophagitis: Secondary | ICD-10-CM | POA: Diagnosis present

## 2018-08-03 DIAGNOSIS — Z923 Personal history of irradiation: Secondary | ICD-10-CM | POA: Diagnosis not present

## 2018-08-03 DIAGNOSIS — E7800 Pure hypercholesterolemia, unspecified: Secondary | ICD-10-CM | POA: Diagnosis present

## 2018-08-03 DIAGNOSIS — E860 Dehydration: Secondary | ICD-10-CM | POA: Diagnosis present

## 2018-08-03 DIAGNOSIS — Z90710 Acquired absence of both cervix and uterus: Secondary | ICD-10-CM

## 2018-08-03 DIAGNOSIS — Z87891 Personal history of nicotine dependence: Secondary | ICD-10-CM

## 2018-08-03 DIAGNOSIS — Z9221 Personal history of antineoplastic chemotherapy: Secondary | ICD-10-CM | POA: Diagnosis not present

## 2018-08-03 DIAGNOSIS — Z20828 Contact with and (suspected) exposure to other viral communicable diseases: Secondary | ICD-10-CM | POA: Diagnosis present

## 2018-08-03 DIAGNOSIS — E1151 Type 2 diabetes mellitus with diabetic peripheral angiopathy without gangrene: Secondary | ICD-10-CM | POA: Diagnosis present

## 2018-08-03 DIAGNOSIS — Z79899 Other long term (current) drug therapy: Secondary | ICD-10-CM

## 2018-08-03 DIAGNOSIS — Z800 Family history of malignant neoplasm of digestive organs: Secondary | ICD-10-CM

## 2018-08-03 DIAGNOSIS — Z79890 Hormone replacement therapy (postmenopausal): Secondary | ICD-10-CM

## 2018-08-03 DIAGNOSIS — Z7984 Long term (current) use of oral hypoglycemic drugs: Secondary | ICD-10-CM | POA: Diagnosis not present

## 2018-08-03 DIAGNOSIS — K429 Umbilical hernia without obstruction or gangrene: Secondary | ICD-10-CM | POA: Diagnosis present

## 2018-08-03 DIAGNOSIS — E079 Disorder of thyroid, unspecified: Secondary | ICD-10-CM | POA: Diagnosis present

## 2018-08-03 DIAGNOSIS — C20 Malignant neoplasm of rectum: Principal | ICD-10-CM | POA: Diagnosis present

## 2018-08-03 DIAGNOSIS — I10 Essential (primary) hypertension: Secondary | ICD-10-CM | POA: Diagnosis present

## 2018-08-03 HISTORY — PX: UMBILICAL HERNIA REPAIR: SHX196

## 2018-08-03 HISTORY — PX: XI ROBOTIC ASSISTED LOWER ANTERIOR RESECTION: SHX6558

## 2018-08-03 HISTORY — PX: DIVERTING ILEOSTOMY: SHX5799

## 2018-08-03 LAB — GLUCOSE, CAPILLARY
Glucose-Capillary: 138 mg/dL — ABNORMAL HIGH (ref 70–99)
Glucose-Capillary: 280 mg/dL — ABNORMAL HIGH (ref 70–99)
Glucose-Capillary: 273 mg/dL — ABNORMAL HIGH (ref 70–99)
Glucose-Capillary: 249 mg/dL — ABNORMAL HIGH (ref 70–99)
Glucose-Capillary: 195 mg/dL — ABNORMAL HIGH (ref 70–99)
Glucose-Capillary: 90 mg/dL (ref 70–99)

## 2018-08-03 LAB — TYPE AND SCREEN
ABO/RH(D): AB POS
Antibody Screen: NEGATIVE

## 2018-08-03 LAB — ABO/RH: ABO/RH(D): AB POS

## 2018-08-03 SURGERY — RESECTION, RECTUM, LOW ANTERIOR, ROBOT-ASSISTED
Anesthesia: General | Site: Abdomen

## 2018-08-03 MED ORDER — TRIAMTERENE-HCTZ 75-50 MG PO TABS
1.0000 | ORAL_TABLET | Freq: Every day | ORAL | Status: DC
  Administered 2018-08-04 – 2018-08-09 (×6): 1 via ORAL
  Filled 2018-08-03 (×6): qty 1

## 2018-08-03 MED ORDER — ONDANSETRON HCL 4 MG/2ML IJ SOLN
INTRAMUSCULAR | Status: DC | PRN
  Administered 2018-08-03: 4 mg via INTRAVENOUS

## 2018-08-03 MED ORDER — MIRTAZAPINE 15 MG PO TABS: 15.0000 mg | ORAL_TABLET | Freq: Every evening | ORAL | Status: DC | PRN

## 2018-08-03 MED ORDER — LORATADINE 10 MG PO TABS
10.0000 mg | ORAL_TABLET | Freq: Every day | ORAL | Status: DC
  Administered 2018-08-03 – 2018-08-09 (×7): 10 mg via ORAL
  Filled 2018-08-03 (×7): qty 1

## 2018-08-03 MED ORDER — HEPARIN SODIUM (PORCINE) 5000 UNIT/ML IJ SOLN
5000.0000 [IU] | Freq: Once | INTRAMUSCULAR | Status: AC
  Administered 2018-08-03: 5000 [IU] via SUBCUTANEOUS
  Filled 2018-08-03: qty 1

## 2018-08-03 MED ORDER — HYDROMORPHONE HCL 1 MG/ML IJ SOLN
INTRAMUSCULAR | Status: AC
  Filled 2018-08-03: qty 1

## 2018-08-03 MED ORDER — ONDANSETRON HCL 4 MG/2ML IJ SOLN
4.0000 mg | Freq: Four times a day (QID) | INTRAMUSCULAR | Status: DC | PRN
  Administered 2018-08-08 – 2018-08-09 (×2): 4 mg via INTRAVENOUS
  Filled 2018-08-03 (×2): qty 2

## 2018-08-03 MED ORDER — ROCURONIUM BROMIDE 10 MG/ML (PF) SYRINGE
PREFILLED_SYRINGE | INTRAVENOUS | Status: AC
  Filled 2018-08-03: qty 10

## 2018-08-03 MED ORDER — ACETAMINOPHEN 500 MG PO TABS
1000.0000 mg | ORAL_TABLET | Freq: Four times a day (QID) | ORAL | Status: DC
  Administered 2018-08-03 – 2018-08-09 (×21): 1000 mg via ORAL
  Filled 2018-08-03 (×23): qty 2

## 2018-08-03 MED ORDER — EPHEDRINE SULFATE-NACL 50-0.9 MG/10ML-% IV SOSY
PREFILLED_SYRINGE | INTRAVENOUS | Status: DC | PRN
  Administered 2018-08-03: 10 mg via INTRAVENOUS

## 2018-08-03 MED ORDER — ROCURONIUM BROMIDE 50 MG/5ML IV SOSY
PREFILLED_SYRINGE | INTRAVENOUS | Status: DC | PRN
  Administered 2018-08-03: 20 mg via INTRAVENOUS
  Administered 2018-08-03: 60 mg via INTRAVENOUS
  Administered 2018-08-03 (×2): 10 mg via INTRAVENOUS

## 2018-08-03 MED ORDER — ENOXAPARIN SODIUM 40 MG/0.4ML ~~LOC~~ SOLN
40.0000 mg | SUBCUTANEOUS | Status: DC
  Administered 2018-08-04 – 2018-08-09 (×6): 40 mg via SUBCUTANEOUS
  Filled 2018-08-03 (×6): qty 0.4

## 2018-08-03 MED ORDER — LACTATED RINGERS IV SOLN
INTRAVENOUS | Status: DC
  Administered 2018-08-03 (×3): via INTRAVENOUS

## 2018-08-03 MED ORDER — EPHEDRINE 5 MG/ML INJ
INTRAVENOUS | Status: AC
  Filled 2018-08-03: qty 10

## 2018-08-03 MED ORDER — FENTANYL CITRATE (PF) 250 MCG/5ML IJ SOLN
INTRAMUSCULAR | Status: DC | PRN
  Administered 2018-08-03 (×7): 50 ug via INTRAVENOUS

## 2018-08-03 MED ORDER — DIPHENHYDRAMINE HCL 12.5 MG/5ML PO ELIX
12.5000 mg | ORAL_SOLUTION | Freq: Four times a day (QID) | ORAL | Status: DC | PRN
  Administered 2018-08-08: 12.5 mg via ORAL
  Filled 2018-08-03: qty 5

## 2018-08-03 MED ORDER — DIPHENHYDRAMINE HCL 50 MG/ML IJ SOLN: 12.5000 mg | Freq: Four times a day (QID) | INTRAMUSCULAR | Status: DC | PRN

## 2018-08-03 MED ORDER — METFORMIN HCL ER 500 MG PO TB24
1000.0000 mg | ORAL_TABLET | Freq: Two times a day (BID) | ORAL | Status: DC
  Administered 2018-08-05 – 2018-08-09 (×8): 1000 mg via ORAL
  Filled 2018-08-03 (×9): qty 2

## 2018-08-03 MED ORDER — ALBUMIN HUMAN 5 % IV SOLN
12.5000 g | Freq: Once | INTRAVENOUS | Status: AC
  Administered 2018-08-03: 13:00:00 12.5 g via INTRAVENOUS

## 2018-08-03 MED ORDER — ESMOLOL HCL 100 MG/10ML IV SOLN
INTRAVENOUS | Status: AC
  Filled 2018-08-03: qty 10

## 2018-08-03 MED ORDER — ONDANSETRON HCL 4 MG/2ML IJ SOLN
INTRAMUSCULAR | Status: AC
  Filled 2018-08-03: qty 2

## 2018-08-03 MED ORDER — LACTATED RINGERS IR SOLN
Status: DC | PRN
  Administered 2018-08-03: 1000 mL

## 2018-08-03 MED ORDER — ALBUMIN HUMAN 5 % IV SOLN
INTRAVENOUS | Status: AC
  Filled 2018-08-03: qty 250

## 2018-08-03 MED ORDER — 0.9 % SODIUM CHLORIDE (POUR BTL) OPTIME
TOPICAL | Status: DC | PRN
  Administered 2018-08-03: 2000 mL

## 2018-08-03 MED ORDER — ALBUTEROL SULFATE (2.5 MG/3ML) 0.083% IN NEBU: 3.0000 mL | INHALATION_SOLUTION | Freq: Four times a day (QID) | RESPIRATORY_TRACT | Status: DC | PRN

## 2018-08-03 MED ORDER — LIDOCAINE HCL 2 % IJ SOLN
INTRAMUSCULAR | Status: AC
  Filled 2018-08-03: qty 20

## 2018-08-03 MED ORDER — HYDRALAZINE HCL 20 MG/ML IJ SOLN
INTRAMUSCULAR | Status: DC | PRN
  Administered 2018-08-03 (×3): 2 mg via INTRAVENOUS

## 2018-08-03 MED ORDER — DEXAMETHASONE SODIUM PHOSPHATE 10 MG/ML IJ SOLN
INTRAMUSCULAR | Status: DC | PRN
  Administered 2018-08-03: 6 mg via INTRAVENOUS

## 2018-08-03 MED ORDER — GABAPENTIN 300 MG PO CAPS
300.0000 mg | ORAL_CAPSULE | Freq: Two times a day (BID) | ORAL | Status: DC
  Administered 2018-08-03 – 2018-08-09 (×12): 300 mg via ORAL
  Filled 2018-08-03 (×12): qty 1

## 2018-08-03 MED ORDER — ENSURE SURGERY PO LIQD
237.0000 mL | Freq: Two times a day (BID) | ORAL | Status: DC
  Administered 2018-08-04 – 2018-08-08 (×8): 237 mL via ORAL
  Filled 2018-08-03 (×12): qty 237

## 2018-08-03 MED ORDER — PNEUMOCOCCAL VAC POLYVALENT 25 MCG/0.5ML IJ INJ
0.5000 mL | INJECTION | INTRAMUSCULAR | Status: DC
  Filled 2018-08-03: qty 0.5

## 2018-08-03 MED ORDER — MIDAZOLAM HCL 2 MG/2ML IJ SOLN
INTRAMUSCULAR | Status: AC
  Filled 2018-08-03: qty 2

## 2018-08-03 MED ORDER — AMLODIPINE BESYLATE 5 MG PO TABS
5.0000 mg | ORAL_TABLET | Freq: Every day | ORAL | Status: DC
  Administered 2018-08-03 – 2018-08-09 (×7): 5 mg via ORAL
  Filled 2018-08-03 (×7): qty 1

## 2018-08-03 MED ORDER — SUCCINYLCHOLINE CHLORIDE 200 MG/10ML IV SOSY
PREFILLED_SYRINGE | INTRAVENOUS | Status: AC
  Filled 2018-08-03: qty 10

## 2018-08-03 MED ORDER — ACETAMINOPHEN 10 MG/ML IV SOLN
INTRAVENOUS | Status: DC | PRN
  Administered 2018-08-03: 1000 mg via INTRAVENOUS

## 2018-08-03 MED ORDER — TRAMADOL HCL 50 MG PO TABS
50.0000 mg | ORAL_TABLET | Freq: Four times a day (QID) | ORAL | Status: DC | PRN
  Administered 2018-08-03: 50 mg via ORAL
  Filled 2018-08-03: qty 1

## 2018-08-03 MED ORDER — ONDANSETRON HCL 4 MG PO TABS: 4.0000 mg | ORAL_TABLET | Freq: Four times a day (QID) | ORAL | Status: DC | PRN

## 2018-08-03 MED ORDER — SUCCINYLCHOLINE CHLORIDE 200 MG/10ML IV SOSY
PREFILLED_SYRINGE | INTRAVENOUS | Status: DC | PRN
  Administered 2018-08-03: 80 mg via INTRAVENOUS

## 2018-08-03 MED ORDER — LIDOCAINE 2% (20 MG/ML) 5 ML SYRINGE
INTRAMUSCULAR | Status: DC | PRN
  Administered 2018-08-03: 1.5 mg/kg/h via INTRAVENOUS

## 2018-08-03 MED ORDER — INSULIN ASPART 100 UNIT/ML ~~LOC~~ SOLN
SUBCUTANEOUS | Status: AC
  Filled 2018-08-03: qty 1

## 2018-08-03 MED ORDER — HYDROMORPHONE HCL 1 MG/ML IJ SOLN: 0.5000 mg | INTRAMUSCULAR | Status: DC | PRN

## 2018-08-03 MED ORDER — ALVIMOPAN 12 MG PO CAPS: 12.0000 mg | ORAL_CAPSULE | Freq: Two times a day (BID) | ORAL | Status: DC

## 2018-08-03 MED ORDER — GLIMEPIRIDE 1 MG PO TABS
1.0000 mg | ORAL_TABLET | Freq: Two times a day (BID) | ORAL | Status: DC
  Administered 2018-08-04 – 2018-08-09 (×9): 1 mg via ORAL
  Filled 2018-08-03 (×12): qty 1

## 2018-08-03 MED ORDER — SODIUM CHLORIDE 0.9 % IV SOLN
2.0000 g | INTRAVENOUS | Status: AC
  Administered 2018-08-03: 2 g via INTRAVENOUS
  Filled 2018-08-03: qty 2

## 2018-08-03 MED ORDER — PROPOFOL 10 MG/ML IV BOLUS
INTRAVENOUS | Status: AC
  Filled 2018-08-03: qty 20

## 2018-08-03 MED ORDER — KETAMINE HCL 10 MG/ML IJ SOLN
INTRAMUSCULAR | Status: DC | PRN
  Administered 2018-08-03: 30 mg via INTRAVENOUS

## 2018-08-03 MED ORDER — DEXAMETHASONE SODIUM PHOSPHATE 10 MG/ML IJ SOLN
INTRAMUSCULAR | Status: AC
  Filled 2018-08-03: qty 1

## 2018-08-03 MED ORDER — ACETAMINOPHEN 10 MG/ML IV SOLN
INTRAVENOUS | Status: AC
  Filled 2018-08-03: qty 100

## 2018-08-03 MED ORDER — INSULIN ASPART 100 UNIT/ML ~~LOC~~ SOLN
0.0000 [IU] | Freq: Three times a day (TID) | SUBCUTANEOUS | Status: DC
  Administered 2018-08-03: 4 [IU] via SUBCUTANEOUS
  Administered 2018-08-03: 11 [IU] via SUBCUTANEOUS
  Administered 2018-08-04: 4 [IU] via SUBCUTANEOUS
  Administered 2018-08-04 (×2): 3 [IU] via SUBCUTANEOUS
  Administered 2018-08-05 (×2): 4 [IU] via SUBCUTANEOUS
  Administered 2018-08-06: 3 [IU] via SUBCUTANEOUS
  Administered 2018-08-06 – 2018-08-07 (×3): 4 [IU] via SUBCUTANEOUS
  Administered 2018-08-07: 7 [IU] via SUBCUTANEOUS
  Administered 2018-08-08 (×2): 4 [IU] via SUBCUTANEOUS
  Administered 2018-08-09 (×2): 3 [IU] via SUBCUTANEOUS

## 2018-08-03 MED ORDER — PHENYLEPHRINE 40 MCG/ML (10ML) SYRINGE FOR IV PUSH (FOR BLOOD PRESSURE SUPPORT)
PREFILLED_SYRINGE | INTRAVENOUS | Status: DC | PRN
  Administered 2018-08-03: 120 ug via INTRAVENOUS
  Administered 2018-08-03: 80 ug via INTRAVENOUS

## 2018-08-03 MED ORDER — ACETAMINOPHEN 500 MG PO TABS
1000.0000 mg | ORAL_TABLET | ORAL | Status: DC
  Filled 2018-08-03: qty 2

## 2018-08-03 MED ORDER — FENTANYL CITRATE (PF) 250 MCG/5ML IJ SOLN
INTRAMUSCULAR | Status: AC
  Filled 2018-08-03: qty 5

## 2018-08-03 MED ORDER — SODIUM CHLORIDE (PF) 0.9 % IJ SOLN
INTRAMUSCULAR | Status: AC
  Filled 2018-08-03: qty 10

## 2018-08-03 MED ORDER — ATORVASTATIN CALCIUM 10 MG PO TABS
10.0000 mg | ORAL_TABLET | Freq: Every day | ORAL | Status: DC
  Administered 2018-08-03 – 2018-08-09 (×7): 10 mg via ORAL
  Filled 2018-08-03 (×7): qty 1

## 2018-08-03 MED ORDER — KCL IN DEXTROSE-NACL 20-5-0.45 MEQ/L-%-% IV SOLN
INTRAVENOUS | Status: DC
  Administered 2018-08-03 – 2018-08-08 (×5): via INTRAVENOUS
  Filled 2018-08-03 (×7): qty 1000

## 2018-08-03 MED ORDER — ALVIMOPAN 12 MG PO CAPS
12.0000 mg | ORAL_CAPSULE | ORAL | Status: AC
  Administered 2018-08-03: 12 mg via ORAL
  Filled 2018-08-03: qty 1

## 2018-08-03 MED ORDER — PROPOFOL 10 MG/ML IV BOLUS
INTRAVENOUS | Status: DC | PRN
  Administered 2018-08-03: 70 mg via INTRAVENOUS
  Administered 2018-08-03: 30 mg via INTRAVENOUS

## 2018-08-03 MED ORDER — BUPIVACAINE-EPINEPHRINE (PF) 0.25% -1:200000 IJ SOLN
INTRAMUSCULAR | Status: AC
  Filled 2018-08-03: qty 30

## 2018-08-03 MED ORDER — LIDOCAINE 2% (20 MG/ML) 5 ML SYRINGE
INTRAMUSCULAR | Status: AC
  Filled 2018-08-03: qty 5

## 2018-08-03 MED ORDER — SUGAMMADEX SODIUM 200 MG/2ML IV SOLN
INTRAVENOUS | Status: DC | PRN
  Administered 2018-08-03: 200 mg via INTRAVENOUS

## 2018-08-03 MED ORDER — FENTANYL CITRATE (PF) 100 MCG/2ML IJ SOLN
INTRAMUSCULAR | Status: AC
  Filled 2018-08-03: qty 2

## 2018-08-03 MED ORDER — KETAMINE HCL 10 MG/ML IJ SOLN
INTRAMUSCULAR | Status: AC
  Filled 2018-08-03: qty 1

## 2018-08-03 MED ORDER — SODIUM CHLORIDE 0.9 % IV SOLN
INTRAVENOUS | Status: DC | PRN
  Administered 2018-08-03: 35 ug/min via INTRAVENOUS

## 2018-08-03 MED ORDER — MIDAZOLAM HCL 5 MG/5ML IJ SOLN
INTRAMUSCULAR | Status: DC | PRN
  Administered 2018-08-03 (×2): 1 mg via INTRAVENOUS

## 2018-08-03 MED ORDER — LIDOCAINE 2% (20 MG/ML) 5 ML SYRINGE
INTRAMUSCULAR | Status: DC | PRN
  Administered 2018-08-03: 80 mg via INTRAVENOUS

## 2018-08-03 MED ORDER — SODIUM CHLORIDE 0.9 % IV SOLN
2.0000 g | Freq: Two times a day (BID) | INTRAVENOUS | Status: AC
  Administered 2018-08-03: 2 g via INTRAVENOUS
  Filled 2018-08-03: qty 2

## 2018-08-03 MED ORDER — NAPHAZOLINE-GLYCERIN 0.012-0.2 % OP SOLN: 1.0000 [drp] | Freq: Two times a day (BID) | OPHTHALMIC | Status: DC | PRN

## 2018-08-03 MED ORDER — HYDRALAZINE HCL 20 MG/ML IJ SOLN
INTRAMUSCULAR | Status: AC
  Filled 2018-08-03: qty 1

## 2018-08-03 MED ORDER — BUPIVACAINE LIPOSOME 1.3 % IJ SUSP
INTRAMUSCULAR | Status: DC | PRN
  Administered 2018-08-03: 20 mL

## 2018-08-03 MED ORDER — BUPIVACAINE-EPINEPHRINE (PF) 0.25% -1:200000 IJ SOLN
INTRAMUSCULAR | Status: DC | PRN
  Administered 2018-08-03: 30 mL

## 2018-08-03 MED ORDER — PHENYLEPHRINE 40 MCG/ML (10ML) SYRINGE FOR IV PUSH (FOR BLOOD PRESSURE SUPPORT)
PREFILLED_SYRINGE | INTRAVENOUS | Status: AC
  Filled 2018-08-03: qty 10

## 2018-08-03 MED ORDER — METOPROLOL TARTRATE 5 MG/5ML IV SOLN
5.0000 mg | Freq: Four times a day (QID) | INTRAVENOUS | Status: DC | PRN
  Administered 2018-08-03: 5 mg via INTRAVENOUS
  Filled 2018-08-03: qty 5

## 2018-08-03 MED ORDER — INDOCYANINE GREEN 25 MG IV SOLR
INTRAVENOUS | Status: DC | PRN
  Administered 2018-08-03: 7.5 mg via INTRAVENOUS

## 2018-08-03 MED ORDER — HYDROMORPHONE HCL 1 MG/ML IJ SOLN
0.2500 mg | INTRAMUSCULAR | Status: DC | PRN
  Administered 2018-08-03: 0.5 mg via INTRAVENOUS

## 2018-08-03 MED ORDER — INSULIN ASPART 100 UNIT/ML ~~LOC~~ SOLN: 0.0000 [IU] | Freq: Every day | SUBCUTANEOUS | Status: DC

## 2018-08-03 MED ORDER — ALBUMIN HUMAN 5 % IV SOLN
INTRAVENOUS | Status: AC
  Administered 2018-08-03: 12.5 g via INTRAVENOUS
  Filled 2018-08-03: qty 250

## 2018-08-03 MED ORDER — LEVOTHYROXINE SODIUM 50 MCG PO TABS
50.0000 ug | ORAL_TABLET | Freq: Every day | ORAL | Status: DC
  Administered 2018-08-04 – 2018-08-09 (×6): 50 ug via ORAL
  Filled 2018-08-03 (×6): qty 1

## 2018-08-03 MED ORDER — ALUM & MAG HYDROXIDE-SIMETH 200-200-20 MG/5ML PO SUSP
30.0000 mL | Freq: Four times a day (QID) | ORAL | Status: DC | PRN
  Administered 2018-08-05 – 2018-08-06 (×2): 30 mL via ORAL
  Filled 2018-08-03 (×2): qty 30

## 2018-08-03 MED ORDER — SACCHAROMYCES BOULARDII 250 MG PO CAPS
250.0000 mg | ORAL_CAPSULE | Freq: Two times a day (BID) | ORAL | Status: DC
  Administered 2018-08-03 – 2018-08-09 (×12): 250 mg via ORAL
  Filled 2018-08-03 (×12): qty 1

## 2018-08-03 SURGICAL SUPPLY — 111 items
ADH SKN CLS APL DERMABOND .7 (GAUZE/BANDAGES/DRESSINGS) ×2
BARRIER SKIN 2 3/4 (OSTOMY) ×3 IMPLANT
BARRIER SKIN OD2.25 2 3/4 FLNG (OSTOMY) IMPLANT
BLADE EXTENDED COATED 6.5IN (ELECTRODE) IMPLANT
BRR SKN FLT 2.75X2.25 2 PC (OSTOMY) ×2
CANNULA REDUC XI 12-8 STAPL (CANNULA) ×1
CANNULA REDUCER 12-8 DVNC XI (CANNULA) ×2 IMPLANT
CATH ROBINSON RED A/P 16FR (CATHETERS) ×1 IMPLANT
CELLS DAT CNTRL 66122 CELL SVR (MISCELLANEOUS) IMPLANT
CLIP VESOLOCK LG 6/CT PURPLE (CLIP) IMPLANT
CLIP VESOLOCK MED 6/CT (CLIP) IMPLANT
COVER SURGICAL LIGHT HANDLE (MISCELLANEOUS) ×6 IMPLANT
COVER TIP SHEARS 8 DVNC (MISCELLANEOUS) ×2 IMPLANT
COVER TIP SHEARS 8MM DA VINCI (MISCELLANEOUS) ×1
COVER WAND RF STERILE (DRAPES) ×3 IMPLANT
DECANTER SPIKE VIAL GLASS SM (MISCELLANEOUS) ×1 IMPLANT
DERMABOND ADVANCED (GAUZE/BANDAGES/DRESSINGS) ×1
DERMABOND ADVANCED .7 DNX12 (GAUZE/BANDAGES/DRESSINGS) IMPLANT
DRAIN CHANNEL 19F RND (DRAIN) ×1 IMPLANT
DRAPE ARM DVNC X/XI (DISPOSABLE) ×8 IMPLANT
DRAPE COLUMN DVNC XI (DISPOSABLE) ×2 IMPLANT
DRAPE DA VINCI XI ARM (DISPOSABLE) ×4
DRAPE DA VINCI XI COLUMN (DISPOSABLE) ×1
DRAPE SURG IRRIG POUCH 19X23 (DRAPES) ×3 IMPLANT
DRSG OPSITE POSTOP 4X10 (GAUZE/BANDAGES/DRESSINGS) IMPLANT
DRSG OPSITE POSTOP 4X6 (GAUZE/BANDAGES/DRESSINGS) ×1 IMPLANT
DRSG OPSITE POSTOP 4X8 (GAUZE/BANDAGES/DRESSINGS) IMPLANT
ELECT PENCIL ROCKER SW 15FT (MISCELLANEOUS) ×3 IMPLANT
ELECT REM PT RETURN 15FT ADLT (MISCELLANEOUS) ×3 IMPLANT
ENDOLOOP SUT PDS II  0 18 (SUTURE)
ENDOLOOP SUT PDS II 0 18 (SUTURE) IMPLANT
EVACUATOR SILICONE 100CC (DRAIN) ×1 IMPLANT
GAUZE SPONGE 4X4 12PLY STRL (GAUZE/BANDAGES/DRESSINGS) IMPLANT
GLOVE BIO SURGEON STRL SZ 6 (GLOVE) ×2 IMPLANT
GLOVE BIO SURGEON STRL SZ 6.5 (GLOVE) ×9 IMPLANT
GLOVE BIO SURGEON STRL SZ7.5 (GLOVE) ×2 IMPLANT
GLOVE BIOGEL PI IND STRL 6.5 (GLOVE) IMPLANT
GLOVE BIOGEL PI IND STRL 7.0 (GLOVE) ×6 IMPLANT
GLOVE BIOGEL PI INDICATOR 6.5 (GLOVE) ×2
GLOVE BIOGEL PI INDICATOR 7.0 (GLOVE) ×7
GLOVE INDICATOR 8.0 STRL GRN (GLOVE) ×2 IMPLANT
GLOVE SURG SS PI 7.0 STRL IVOR (GLOVE) ×2 IMPLANT
GOWN STRL REUS W/ TWL XL LVL3 (GOWN DISPOSABLE) IMPLANT
GOWN STRL REUS W/TWL 2XL LVL3 (GOWN DISPOSABLE) ×9 IMPLANT
GOWN STRL REUS W/TWL XL LVL3 (GOWN DISPOSABLE) ×22 IMPLANT
GRASPER ENDOPATH ANVIL 10MM (MISCELLANEOUS) IMPLANT
GRASPER SUT TROCAR 14GX15 (MISCELLANEOUS) ×1 IMPLANT
HOLDER FOLEY CATH W/STRAP (MISCELLANEOUS) ×3 IMPLANT
IRRIG SUCT STRYKERFLOW 2 WTIP (MISCELLANEOUS) ×3
IRRIGATION SUCT STRKRFLW 2 WTP (MISCELLANEOUS) ×2 IMPLANT
IRRIGATOR SUCT 8 DISP DVNC XI (IRRIGATION / IRRIGATOR) IMPLANT
IRRIGATOR SUCTION 8MM XI DISP (IRRIGATION / IRRIGATOR)
KIT PROCEDURE DA VINCI SI (MISCELLANEOUS) ×1
KIT PROCEDURE DVNC SI (MISCELLANEOUS) IMPLANT
KIT TURNOVER KIT A (KITS) IMPLANT
NDL INSUFFLATION 14GA 120MM (NEEDLE) ×2 IMPLANT
NEEDLE INSUFFLATION 14GA 120MM (NEEDLE) ×3 IMPLANT
PACK CARDIOVASCULAR III (CUSTOM PROCEDURE TRAY) ×3 IMPLANT
PACK COLON (CUSTOM PROCEDURE TRAY) ×3 IMPLANT
PORT LAP GEL ALEXIS MED 5-9CM (MISCELLANEOUS) ×3 IMPLANT
POUCH OSTOMY 2 3/4  H 3804 (WOUND CARE) ×1
POUCH OSTOMY 2 3/4 H 3804 (WOUND CARE) ×2
POUCH OSTOMY 2 PC DRNBL 2.75 (WOUND CARE) IMPLANT
RELOAD STAPLE 45 BLU REG DVNC (STAPLE) IMPLANT
RELOAD STAPLE 45 GRN THCK DVNC (STAPLE) IMPLANT
RETRACTOR WND ALEXIS 18 MED (MISCELLANEOUS) IMPLANT
RTRCTR WOUND ALEXIS 18CM MED (MISCELLANEOUS)
SCISSORS LAP 5X35 DISP (ENDOMECHANICALS) ×3 IMPLANT
SEAL CANN UNIV 5-8 DVNC XI (MISCELLANEOUS) ×6 IMPLANT
SEAL XI 5MM-8MM UNIVERSAL (MISCELLANEOUS) ×4
SEALER VESSEL DA VINCI XI (MISCELLANEOUS) ×1
SEALER VESSEL EXT DVNC XI (MISCELLANEOUS) ×2 IMPLANT
SLEEVE ADV FIXATION 5X100MM (TROCAR) IMPLANT
SOLUTION ELECTROLUBE (MISCELLANEOUS) ×3 IMPLANT
STAPLER 45 BLU RELOAD XI (STAPLE) IMPLANT
STAPLER 45 BLUE RELOAD XI (STAPLE)
STAPLER 45 GREEN RELOAD XI (STAPLE) ×4
STAPLER 45 GRN RELOAD XI (STAPLE) ×8 IMPLANT
STAPLER CANNULA SEAL DVNC XI (STAPLE) ×2 IMPLANT
STAPLER CANNULA SEAL XI (STAPLE) ×1
STAPLER ECHELON POWER CIR 31 (STAPLE) ×1 IMPLANT
STAPLER SHEATH (SHEATH) ×1
STAPLER SHEATH ENDOWRIST DVNC (SHEATH) ×2 IMPLANT
STAPLER VISISTAT 35W (STAPLE) IMPLANT
SUT ETHILON 2 0 PS N (SUTURE) ×1 IMPLANT
SUT NOVA NAB DX-16 0-1 5-0 T12 (SUTURE) ×8 IMPLANT
SUT PROLENE 2 0 KS (SUTURE) ×3 IMPLANT
SUT SILK 2 0 (SUTURE) ×3
SUT SILK 2 0 SH CR/8 (SUTURE) IMPLANT
SUT SILK 2-0 18XBRD TIE 12 (SUTURE) ×2 IMPLANT
SUT SILK 3 0 (SUTURE)
SUT SILK 3 0 SH CR/8 (SUTURE) ×4 IMPLANT
SUT SILK 3-0 18XBRD TIE 12 (SUTURE) IMPLANT
SUT V-LOC BARB 180 2/0GR6 GS22 (SUTURE)
SUT VIC AB 2-0 SH 18 (SUTURE) ×3 IMPLANT
SUT VIC AB 2-0 SH 27 (SUTURE) ×6
SUT VIC AB 2-0 SH 27X BRD (SUTURE) ×2 IMPLANT
SUT VIC AB 3-0 SH 18 (SUTURE) IMPLANT
SUT VIC AB 4-0 PS2 27 (SUTURE) ×7 IMPLANT
SUT VICRYL 0 UR6 27IN ABS (SUTURE) ×3 IMPLANT
SUTURE V-LC BRB 180 2/0GR6GS22 (SUTURE) IMPLANT
SYR 10ML ECCENTRIC (SYRINGE) ×3 IMPLANT
SYS LAPSCP GELPORT 120MM (MISCELLANEOUS)
SYSTEM LAPSCP GELPORT 120MM (MISCELLANEOUS) IMPLANT
TOWEL OR 17X26 10 PK STRL BLUE (TOWEL DISPOSABLE) IMPLANT
TOWEL OR NON WOVEN STRL DISP B (DISPOSABLE) ×3 IMPLANT
TRAY FOLEY MTR SLVR 14FR STAT (SET/KITS/TRAYS/PACK) ×1 IMPLANT
TROCAR ADV FIXATION 5X100MM (TROCAR) ×3 IMPLANT
TUBING CONNECTING 10 (TUBING) ×8 IMPLANT
TUBING ENDO SMARTCAP (MISCELLANEOUS) ×1 IMPLANT
TUBING INSUFFLATION 10FT LAP (TUBING) ×3 IMPLANT

## 2018-08-03 NOTE — Anesthesia Postprocedure Evaluation (Signed)
Anesthesia Post Note  Patient: Nicole Sellers  Procedure(s) Performed: XI ROBOTIC ASSISTED LOWER ANTERIOR RESECTION; DIAGNOSTIC FLEXIBLE SIGMOIDOSCOPY; INTRAOPERATIVE ASSESSMENT OF VASCULAR PERFUSION (N/A Abdomen) UMBILICAL HERNIA REPAIR (N/A Abdomen) DIVERTING LOOP ILEOSTOMY (N/A Abdomen)     Patient location during evaluation: PACU Anesthesia Type: General Level of consciousness: awake and alert Pain management: pain level controlled Vital Signs Assessment: post-procedure vital signs reviewed and stable Respiratory status: spontaneous breathing, nonlabored ventilation, respiratory function stable and patient connected to nasal cannula oxygen Cardiovascular status: blood pressure returned to baseline and stable Postop Assessment: no apparent nausea or vomiting Anesthetic complications: no    Last Vitals:  Vitals:   08/03/18 1500 08/03/18 1515  BP:  105/82  Pulse:    Resp: 15   Temp:  (!) 36.3 C  SpO2:      Last Pain:  Vitals:   08/03/18 1515  TempSrc:   PainSc: 2                  Evarose Altland,W. EDMOND

## 2018-08-03 NOTE — H&P (Signed)
The patient is a 68 year old female who presents with colorectal cancer. 68 year old female with rectal bleeding who presents to the office with a newly diagnosed rectal cancer. She underwent a Cologuard test in July 2019 which was positive. She underwent a colonoscopy with Dr. Collene Mares in September 2019 which showed a rectosigmoid mass. It does not appear that it was tattooed. This showed a moderately differentiated adenocarcinoma. CT scans of the chest abdomen and pelvis were performed. These showed some small lung nodules and a mass that appears to be in her proximal rectum. There is some pericolonic lymph nodes noted as well. CEA level was elevated at 55. She reports no further rectal bleeding. She is having some diarrhea. She denies any weight loss. Surgical history is significant for hysterectomy and umbilical hernia repair. Patient has coronary artery disease but has stopped her Plavix. Her cardiologist is Dr. Alvester Chou. She is a former smoker and has a peripheral artery stent in her leg.  She completed her radiation treatment in early May 2020. She tolerated this well but did have some difficulties with her chemotherapy regimen.   Problem List/Past Medical Leighton Ruff, MD; 1/61/0960 2:31 PM) RECTAL CANCER (C20)  Past Surgical History Leighton Ruff, MD; 4/54/0981 2:31 PM) Hysterectomy (not due to cancer) - Complete  Diagnostic Studies History Leighton Ruff, MD; 1/91/4782 2:31 PM) Colonoscopy within last year Pap Smear 1-5 years ago  Allergies Sabino Gasser, [place]; 05/24/2018 2:02 PM) Lisinopril *ANTIHYPERTENSIVES* Allergies Reconciled  Medication History Leighton Ruff, MD; 9/56/2130 2:31 PM) Glimepiride (1MG  Tablet, Oral) Active. Levothyroxine Sodium (50MCG Tablet, Oral) Active. metFORMIN HCl (500MG  Tablet, Oral) Active. Triamterene-HCTZ (75-50MG  Tablet, Oral) Active. amLODIPine Besylate (5MG  Tablet, Oral) Active. Atorvastatin Calcium (10MG  Tablet,  Oral) Active. Clopidogrel Bisulfate (75MG  Tablet, Oral) Active. Gabapentin (300MG  Capsule, Oral) Active. Medications Reconciled Neomycin Sulfate (500MG  Tablet, 2 (two) Oral SEE NOTE, Taken starting 05/24/2018) Active. (TAKE TWO TABLETS AT 2 PM, 3 PM, AND 10 PM THE DAY PRIOR TO SURGERY) Flagyl (500MG  Tablet, 2 (two) Oral SEE NOTE, Taken starting 05/24/2018) Active. (Take at 2pm, 3pm, and 10pm the day prior to your colon operation)  Social History Leighton Ruff, MD; 8/65/7846 2:31 PM) Caffeine use Coffee. No drug use Tobacco use Former smoker.  Family History Leighton Ruff, MD; 9/62/9528 2:31 PM) Cancer Father. Hypertension Brother, Mother.  Pregnancy / Birth History Leighton Ruff, MD; 04/23/2438 2:31 PM) Age at menarche 55 years. Age of menopause 62-50 Length (months) of breastfeeding 3-6 Maternal age 54-20 Para 1  Other Problems Leighton Ruff, MD; 01/12/7251 2:31 PM) Arthritis Back Pain Depression Diabetes Mellitus Gastroesophageal Reflux Disease High blood pressure Hypercholesterolemia Oophorectomy Bilateral. Rectal Cancer Thyroid Disease Vascular Disease     Review of Systems  General Present- Appetite Loss and Fatigue. Not Present- Chills, Fever, Night Sweats, Weight Gain and Weight Loss. HEENT Present- Visual Disturbances and Wears glasses/contact lenses. Not Present- Earache, Hearing Loss, Hoarseness, Nose Bleed, Oral Ulcers, Ringing in the Ears, Seasonal Allergies, Sinus Pain, Sore Throat and Yellow Eyes. Respiratory Present- Chronic Cough. Not Present- Bloody sputum, Difficulty Breathing, Snoring and Wheezing. Cardiovascular Present- Leg Cramps and Swelling of Extremities. Not Present- Chest Pain, Difficulty Breathing Lying Down, Palpitations, Rapid Heart Rate and Shortness of Breath. Gastrointestinal Present- Bloating, Chronic diarrhea, Excessive gas and Gets full quickly at meals. Not Present- Abdominal Pain, Bloody Stool,  Change in Bowel Habits, Constipation, Difficulty Swallowing, Hemorrhoids, Indigestion, Nausea, Rectal Pain and Vomiting. Musculoskeletal Present- Joint Pain. Not Present- Back Pain, Joint Stiffness, Muscle Pain, Muscle Weakness and Swelling of Extremities.  Neurological Present- Decreased Memory, Numbness, Trouble walking and Weakness. Not Present- Fainting, Headaches, Seizures, Tingling and Tremor. Psychiatric Present- Depression. Not Present- Anxiety, Bipolar, Change in Sleep Pattern, Fearful and Frequent crying. Hematology Present- Blood Thinners and Gland problems. Not Present- Easy Bruising, Excessive bleeding, HIV and Persistent Infections.  BP (!) 179/77   Pulse 83   Temp 98.8 F (37.1 C) (Oral)   Resp 16   SpO2 100%    Physical Exam   General Mental Status-Alert. General Appearance-Not in acute distress. Build & Nutrition-Well nourished. Posture-Normal posture. Gait-Normal.  Head and Neck Head-normocephalic, atraumatic with no lesions or palpable masses. Trachea-midline.  Chest and Lung Exam Chest and lung exam reveals -on auscultation, normal breath sounds, no adventitious sounds and normal vocal resonance.  Cardiovascular Cardiovascular examination reveals -normal heart sounds, regular rate and rhythm with no murmurs and no digital clubbing, cyanosis, edema, increased warmth or tenderness.  Abdomen Inspection Inspection of the abdomen reveals - No Hernias. Palpation/Percussion Palpation and Percussion of the abdomen reveal - Soft, Non Tender, No Rigidity (guarding), No hepatosplenomegaly and No Palpable abdominal masses.  Rectal Anorectal Exam External - normal sphincter . Internal - Note: Tumor palpated approximately 8 cm from anal verge.  Neurologic Neurologic evaluation reveals -alert and oriented x 3 with no impairment of recent or remote memory, normal attention span and ability to concentrate, normal sensation and normal  coordination.  Musculoskeletal Normal Exam - Bilateral-Upper Extremity Strength Normal and Lower Extremity Strength Normal.    Assessment & Plan   RECTAL CANCER (C20) Impression: 68 year old female who presents to the office after completing radiation and chemotherapy at the end of April 2020. She has a tumor that is noted to be approximate 7 cm from anal verge. We discussed performing a robotic low anterior resection. I think she is a good candidate for anastomosis. We discussed possible need for temporary ileostomy. All questions were answered. We will plan on flex sig on the OR table to localize tumor on day of surgery. The surgery and anatomy were described to the patient as well as the risks of surgery and the possible complications. These include: Bleeding, deep abdominal infections and possible wound complications such as hernia and infection, damage to adjacent structures, leak of surgical connections, which can lead to other surgeries and possibly an ostomy, possible need for other procedures, such as abscess drains in radiology, possible prolonged hospital stay, possible diarrhea from removal of part of the colon, possible constipation from narcotics, possible bowel, bladder or sexual dysfunction if having rectal surgery, prolonged fatigue/weakness or appetite loss, possible early recurrence of of disease, possible complications of their medical problems such as heart disease or arrhythmias or lung problems, death (less than 1%). I believe the patient understands and wishes to proceed with the surgery.

## 2018-08-03 NOTE — Anesthesia Procedure Notes (Signed)
Procedure Name: Intubation Date/Time: 08/03/2018 7:35 AM Performed by: Maxwell Caul, CRNA Pre-anesthesia Checklist: Patient identified, Emergency Drugs available, Suction available and Patient being monitored Patient Re-evaluated:Patient Re-evaluated prior to induction Oxygen Delivery Method: Circle system utilized Preoxygenation: Pre-oxygenation with 100% oxygen Induction Type: IV induction Laryngoscope Size: Mac and 4 Grade View: Grade I Tube type: Oral Number of attempts: 1 Airway Equipment and Method: Stylet Placement Confirmation: ETT inserted through vocal cords under direct vision,  positive ETCO2 and breath sounds checked- equal and bilateral Secured at: 21 cm Tube secured with: Tape Dental Injury: Teeth and Oropharynx as per pre-operative assessment

## 2018-08-03 NOTE — Transfer of Care (Signed)
Immediate Anesthesia Transfer of Care Note  Patient: Nicole Sellers  Procedure(s) Performed: XI ROBOTIC ASSISTED LOWER ANTERIOR RESECTION; DIAGNOSTIC FLEXIBLE SIGMOIDOSCOPY; INTRAOPERATIVE ASSESSMENT OF VASCULAR PERFUSION (N/A Abdomen) UMBILICAL HERNIA REPAIR (N/A Abdomen) DIVERTING LOOP ILEOSTOMY (N/A Abdomen)  Patient Location: PACU  Anesthesia Type:General  Level of Consciousness: awake, alert  and oriented  Airway & Oxygen Therapy: Patient Spontanous Breathing and Patient connected to face mask oxygen  Post-op Assessment: Report given to RN and Post -op Vital signs reviewed and stable  Post vital signs: Reviewed and stable  Last Vitals:  Vitals Value Taken Time  BP 85/69 08/03/18 1254  Temp    Pulse 81 08/03/18 1255  Resp 10 08/03/18 1255  SpO2 100 % 08/03/18 1255  Vitals shown include unvalidated device data.  Last Pain:  Vitals:   08/03/18 0645  TempSrc: Oral         Complications: No apparent anesthesia complications

## 2018-08-03 NOTE — Op Note (Signed)
08/03/2018  12:41 PM  PATIENT:  Nicole Sellers  68 y.o. female  Patient Care Team: Reynold Bowen, MD as PCP - General (Endocrinology) Lorretta Harp, MD as PCP - Cardiology (Cardiology) Leighton Ruff, MD as Consulting Physician (General Surgery) Juanita Craver, MD as Consulting Physician (Gastroenterology) Truitt Merle, MD as Consulting Physician (Hematology)  PRE-OPERATIVE DIAGNOSIS:  RECTAL CANCER  POST-OPERATIVE DIAGNOSIS:  RECTAL CANCER  PROCEDURE:   XI ROBOTIC ASSISTED LOWER ANTERIOR RESECTION; DIAGNOSTIC FLEXIBLE SIGMOIDOSCOPY; INTRAOPERATIVE ASSESSMENT OF VASCULAR PERFUSION UMBILICAL HERNIA REPAIR DIVERTING LOOP ILEOSTOMY   Surgeon(s): Leighton Ruff, MD Ileana Roup, MD  ASSISTANT: Dr Dema Severin   ANESTHESIA:   local and general  EBL: 145ml Total I/O In: 2000 [I.V.:2000] Out: 46 [Urine:680; Blood:100]  Delay start of Pharmacological VTE agent (>24hrs) due to surgical blood loss or risk of bleeding:  no  DRAINS: (49F) Jackson-Pratt drain(s) with closed bulb suction in the pelvis   SPECIMEN:  Source of Specimen:  Rectosigmoid, additional distal margin (stich marks distal), final distal margin (doughnut)  DISPOSITION OF SPECIMEN:  PATHOLOGY  COUNTS:  YES  PLAN OF CARE: Admit to inpatient   PATIENT DISPOSITION:  PACU - hemodynamically stable.  INDICATION:    68 y.o. female with rectal cancer.  She is status post total neoadjuvant chemotherapy and radiation.  She is now ready for surgical resection:  The anatomy & physiology of the digestive tract was discussed.  The pathophysiology was discussed.  Natural history risks without surgery was discussed.   I worked to give an overview of the disease and the frequent need to have multispecialty involvement.  I feel the risks of no intervention will lead to serious problems that outweigh the operative risks; therefore, I recommended a partial colectomy to remove the pathology.  Laparoscopic & open techniques were  discussed.   Risks such as bleeding, infection, abscess, leak, reoperation, possible ostomy, hernia, heart attack, death, and other risks were discussed.  I noted a good likelihood this will help address the problem.   Goals of post-operative recovery were discussed as well.    The patient expressed understanding & wished to proceed with surgery.  OR FINDINGS:   Patient had minimal tumor in the left lateral mid rectum approximately 8 cm from the anal verge  No obvious metastatic disease on visceral parietal peritoneum or liver.  The anastomosis rests 6 cm from the anal verge by rigid proctoscopy.  DESCRIPTION:   Informed consent was confirmed.  The patient underwent general anaesthesia without difficulty.  The patient was positioned appropriately.  VTE prevention in place.  The patient's abdomen was clipped, prepped, & draped in a sterile fashion.  Surgical timeout confirmed our plan.  The patient was positioned in reverse Trendelenburg.  Abdominal entry was gained using a varies needle in the left upper quadrant.  Entry was clean.  I induced carbon dioxide insufflation.  An 18mm robotic port was placed in the right upper quadrant.  Camera inspection revealed no injury.  Extra ports were carefully placed under direct laparoscopic visualization.  I laparoscopically reflected the greater omentum and the upper abdomen the small bowel in the upper abdomen. The patient was appropriately positioned and the robot was docked to the patient's left side.  Instruments were placed under direct visualization.  The patient had a umbilical hernia with omentum inside of it.  This was taken down using the robotic vessel sealer.  The omentum was then placed in the left upper quadrant.  The sigmoid colon was adherent to the  left pelvic sidewall and vaginal cuff from her previous hysterectomy.  These adhesions were taken down sharply.  Her terminal ileum was adherent to the right pelvic sidewall.  This was also taken  down using sharp dissection and placed in the right lower quadrant.  Once this was completed a diagnostic flexible sigmoidoscopy was performed to localize the rectal tumor.  This was noted in the mid rectum approximately 8 cm from the anal verge.  It was a small ulceration in the left lateral position.   I mobilized the sigmoid colon off of the pelvic sidewall.  I scored the base of peritoneum of the right side of the mesentery of the left colon from the ligament of Treitz to the peritoneal reflection of the mid rectum.  The patient had an elongated IMA.  I elevated the sigmoid mesentery and enetered into the retro-mesenteric plane. We were able to identify the left ureter and gonadal vessels. We kept those posterior within the retroperitoneum and elevated the left colon mesentery off that. I did isolated IMA pedicle but did not ligate it yet.  I continued distally and got into the avascular plane posterior to the mesorectum. This allowed me to help mobilize the rectum as well by freeing the mesorectum off the sacrum.  I mobilized the peritoneal coverings towards the peritoneal reflection on both the right and left sides of the rectum.  I could see the right and left ureters and stayed away from them.    I skeletonized the inferior mesenteric artery pedicle.  I went down to its takeoff from the aorta.   I isolated the inferior mesenteric vein off of the ligament of Treitz just cephalad to that as well.  After confirming the left ureter was out of the way, I went ahead and ligated the inferior mesenteric artery pedicle with bipolar robotic vessel sealer ~2cm above its takeoff from the aorta.   I did ligate the inferior mesenteric vein in a similar fashion.  We ensured hemostasis. I skeletonized the mesorectum at a level that was felt to be distal to the tumor using blunt dissection & bipolar robotic vessel sealer.  I mobilized the left colon in a lateral to medial fashion off the line of Toldt up towards the  splenic flexure to ensure good mobilization of the left colon to reach into the pelvis.  At this point we infused firefly intravenously.  The patient did have good perfusion of her small bowel but her colon lagged quite a bit behind.  She did have perfusion throughout the descending colon to the level of previous dissection.  I divided the rectum using a green load robotic stapler x2.  The remaining mesentery was divided also using the robotic vessel sealer.  We then undocked the robot and made a small Pfannenstiel incision.  An [place] wound protector was placed.  The colon was brought out through this wound protector and divided approximately using a pursestring device and 2-0 Prolene pursestring.  A 31 mm EEA anvil was placed into the colon and the pursestring was tied tightly around this.  There was some oozing noted at the distal margin.  This was placed back into the abdomen.  We opened the specimen on the back table to confirm that the specimen contained the tumor.  Upon further evaluation it was noted that the tumor appeared to be involved in the distal staple line.  We decided to re-docked the robot and take an additional 2 cm of rectum.  This was dissected free  using the robotic vessel sealer.  I then stapled again across the rectum using 2 green load staplers.  This was brought out of the abdomen and the distal margin was marked.  We then completed our anastomosis using a 31 mm powered EEA stapler.  There was no tension on the anastomosis.  There was no leak when tested with insufflation under irrigation.  The anastomosis rest approximately 6 cm from the anal verge.  Given the sub-adequate perfusion on the firefly test, her radiation history and the site of anastomosis, we decided to perform a diverting ileostomy.  A 19 [place] Blake drain was placed into the pelvis and brought out through the right lower quadrant port site.  We then undocked the robot and remove the robotic ports.  We switched to clean  gowns, gloves, instruments and drapes.  I then turned my attention to the umbilical hernia repair.  We had placed our 12 mm port through the hernia.  This was removed and the incision was enlarged.  The fascia was dissected out on either side and reapproximated using interrupted #1 Novafil sutures.  The subcutaneous tissue was closed using interrupted 2-0 Vicryl sutures.  The skin was closed using a running 4-0 Vicryl suture and Dermabond.  I identified a portion of terminal ileum and brought this through the previously marked ostomy site in the right upper quadrant.  A red rubber catheter was placed through the mesentery as an ostomy bar.  This was secured with a silk suture.  The ostomy was oriented with the distal loop in the posterior edge.  We then closed the Pfannenstiel incision.  A 2-0 Vicryl suture was used to close the peritoneum.  #1 interrupted Novafil sutures were used to close the fascia.  Subcutaneous tissue was reapproximated using interrupted 2-0 Vicryl suture and a 4-0 Vicryl running subcuticular suture was used to close the skin.  A sterile dressing was applied.   The remaining port sites were closed using 4-0 Vicryl sutures and Dermabond.  The loop ileostomy was then matured in standard [place] fashion.  This was done using interrupted 2-0 Vicryl sutures.  An ostomy appliance was then placed over this.  The patient was then awakened from anesthesia and sent to the postanesthesia care unit in stable condition.  All counts were correct per operating room staff.  An MD assistant was necessary for tissue manipulation, retraction and positioning due to the complexity of the case and hospital policies

## 2018-08-04 ENCOUNTER — Encounter (HOSPITAL_COMMUNITY): Payer: Self-pay | Admitting: General Surgery

## 2018-08-04 LAB — GLUCOSE, CAPILLARY
Glucose-Capillary: 144 mg/dL — ABNORMAL HIGH (ref 70–99)
Glucose-Capillary: 189 mg/dL — ABNORMAL HIGH (ref 70–99)
Glucose-Capillary: 141 mg/dL — ABNORMAL HIGH (ref 70–99)
Glucose-Capillary: 153 mg/dL — ABNORMAL HIGH (ref 70–99)

## 2018-08-04 LAB — CBC
HCT: 26.6 % — ABNORMAL LOW (ref 36.0–46.0)
Hemoglobin: 8.6 g/dL — ABNORMAL LOW (ref 12.0–15.0)
MCH: 31.2 pg (ref 26.0–34.0)
MCHC: 32.3 g/dL (ref 30.0–36.0)
MCV: 96.4 fL (ref 80.0–100.0)
Platelets: 236 10*3/uL (ref 150–400)
RBC: 2.76 MIL/uL — ABNORMAL LOW (ref 3.87–5.11)
RDW: 12.4 % (ref 11.5–15.5)
WBC: 4.8 10*3/uL (ref 4.0–10.5)
nRBC: 0 % (ref 0.0–0.2)

## 2018-08-04 LAB — BASIC METABOLIC PANEL
Anion gap: 9 (ref 5–15)
BUN: 10 mg/dL (ref 8–23)
CO2: 25 mmol/L (ref 22–32)
Calcium: 8.5 mg/dL — ABNORMAL LOW (ref 8.9–10.3)
Chloride: 102 mmol/L (ref 98–111)
Creatinine, Ser: 0.83 mg/dL (ref 0.44–1.00)
GFR calc Af Amer: >60 mL/min (ref >60)
GFR calc non Af Amer: >60 mL/min (ref >60)
Glucose, Bld: 176 mg/dL — ABNORMAL HIGH (ref 70–99)
Potassium: 3.6 mmol/L (ref 3.5–5.1)
Sodium: 136 mmol/L (ref 135–145)

## 2018-08-04 NOTE — Evaluation (Signed)
Occupational Therapy Evaluation Patient Details Name: Nicole Sellers MRN: [id number] DOB: 22-Jun-1950 Today's Date: 08/04/2018    History of Present Illness Pt is a 68 y.o. female with colorectal cancer now s/p XI ROBOTIC ASSISTED LOWER ANTERIOR RESECTION; DIAGNOSTIC FLEXIBLE SIGMOIDOSCOPY; INTRAOPERATIVE ASSESSMENT OF VASCULAR PERFUSION   Clinical Impression   PTA, pt was independent with assistive device (cane) for ADL and functional mobility and was living alone. She currently demonstrates decreased activity tolerance for ADL as well as abdominal pain limiting her independence. She required moderate assistance for LB dressing and bathing tasks and OT educating concerning modified strategies to improve comfort. Pt may benefit from use of AE for LB ADL as well. Pt would benefit from continued OT services while admitted to improve independence and safety prior to D/C home. Depending on progress, she may benefit from [facility] follow-up to maximize return to [facility] but will continue to update recommendations as needed.     Follow Up Recommendations  Home health OT;Supervision - Intermittent    Equipment Recommendations  None recommended by OT    Recommendations for Other Services       Precautions / Restrictions Precautions Precautions: Fall Restrictions Weight Bearing Restrictions: No      Mobility Bed Mobility Overal bed mobility: Needs Assistance Bed Mobility: Rolling;Sidelying to Sit Rolling: Supervision Sidelying to sit: Min assist       General bed mobility comments: Min assist to press up to sitting from sidelying position  Transfers Overall transfer level: Needs assistance Equipment used: Rolling walker (2 wheeled) Transfers: Sit to/from Stand Sit to Stand: Min assist         General transfer comment: Min assist to power up to standing    Balance Overall balance assessment: Needs assistance Sitting-balance support: No upper extremity supported;Feet  supported Sitting balance-Leahy Scale: Fair     Standing balance support: Bilateral upper extremity supported Standing balance-Leahy Scale: Fair Standing balance comment: Able to statically stand without upper extremity support                           ADL either performed or assessed with clinical judgement   ADL Overall ADL's : Needs assistance/impaired Eating/Feeding: Set up;Sitting   Grooming: Supervision/safety;Standing   Upper Body Bathing: Supervision/ safety;Sitting   Lower Body Bathing: Moderate assistance;Sit to/from stand   Upper Body Dressing : Supervision/safety;Sitting   Lower Body Dressing: Moderate assistance;Sit to/from stand   Toilet Transfer: Minimal assistance;Ambulation;RW Toilet Transfer Details (indicate cue type and reason): Utilized RW due to some instability Toileting- Clothing Manipulation and Hygiene: Minimal assistance;Sit to/from stand       Functional mobility during ADLs: Minimal assistance;Rolling walker General ADL Comments: Pt demonstrating some instability and decreased activity tolerance.     Vision Baseline Vision/History: Wears glasses Wears Glasses: At all times Patient Visual Report: (cataracts) Additional Comments: awaiting another cataract removal     Perception     Praxis      Pertinent Vitals/Pain Pain Assessment: 0-10 Pain Score: 6  Pain Location: abdomen Pain Descriptors / Indicators: Discomfort;Sore Pain Intervention(s): Limited activity within patient's tolerance     Hand Dominance Right   Extremity/Trunk Assessment Upper Extremity Assessment Upper Extremity Assessment: Generalized weakness   Lower Extremity Assessment Lower Extremity Assessment: Generalized weakness       Communication Communication Communication: No difficulties   Cognition Arousal/Alertness: Awake/alert Behavior During Therapy: WFL for tasks assessed/performed Overall Cognitive Status: Within Functional Limits for tasks  assessed  General Comments  Pt very motivated to improve independence    Exercises     Shoulder Instructions      Home Living Family/patient expects to be discharged to:: Private residence Living Arrangements: Alone Available Help at Discharge: Family;Available PRN/intermittently Type of Home: Apartment Home Access: Level entry     Home Layout: One level     Bathroom Shower/Tub: Teacher, early years/pre: Standard Bathroom Accessibility: Yes   Home Equipment: Shower seat;Cane - single point;Walker - 4 wheels          Prior Functioning/Environment Level of Independence: Independent with assistive device(s)        Comments: Does not drive        OT Problem List: Decreased strength;Decreased range of motion;Decreased activity tolerance;Impaired balance (sitting and/or standing);Decreased safety awareness;Decreased knowledge of use of DME or AE;Decreased knowledge of precautions;Pain      OT Treatment/Interventions: Self-care/ADL training;Therapeutic exercise;Energy conservation;DME and/or AE instruction;Therapeutic activities;Patient/family education;Balance training    OT Goals(Current goals can be found in the care plan section) Acute Rehab OT Goals Patient Stated Goal: to get stronger OT Goal Formulation: With patient Time For Goal Achievement: 08/18/18 Potential to Achieve Goals: Good ADL Goals Pt Will Perform Grooming: with modified independence;standing Pt Will Perform Lower Body Dressing: with modified independence;sit to/from stand Pt Will Transfer to Toilet: with modified independence;ambulating;regular height toilet(with or without AD) Pt Will Perform Toileting - Clothing Manipulation and hygiene: with modified independence;sit to/from stand Pt Will Perform Tub/Shower Transfer: with supervision;ambulating;shower seat;rolling walker;Tub transfer  OT Frequency: Min 2X/week   Barriers to D/C:             Co-evaluation              AM-PAC OT "6 Clicks" Daily Activity     Outcome Measure Help from another person eating meals?: None Help from another person taking care of personal grooming?: A Little Help from another person toileting, which includes using toliet, bedpan, or urinal?: A Little Help from another person bathing (including washing, rinsing, drying)?: A Lot Help from another person to put on and taking off regular upper body clothing?: A Little Help from another person to put on and taking off regular lower body clothing?: A Lot 6 Click Score: 17   End of Session Equipment Utilized During Treatment: Rolling walker;Oxygen Nurse Communication: Mobility status(needs new batteries for chair alarm)  Activity Tolerance: Patient tolerated treatment well Patient left: in chair;with call bell/phone within reach;with chair alarm set  OT Visit Diagnosis: Other abnormalities of gait and mobility (R26.89);Pain Pain - part of body: (abdominal)                Time: 0017-4944 OT Time Calculation (min): 35 min Charges:  OT General Charges $OT Visit: 1 Visit OT Evaluation $OT Eval Moderate Complexity: 1 Mod OT Treatments $Self Care/Home Management : 8-22 mins  Wood A Mariza Bourget 08/04/2018, 9:44 AM

## 2018-08-04 NOTE — Evaluation (Signed)
Physical Therapy Evaluation Patient Details Name: Nicole Sellers MRN: [id number] DOB: 14-Mar-1950 Today's Date: 08/04/2018   History of Present Illness  Pt is a 68 y.o. female with colorectal cancer now s/p XI ROBOTIC ASSISTED LOWER ANTERIOR RESECTION; DIAGNOSTIC FLEXIBLE SIGMOIDOSCOPY; INTRAOPERATIVE ASSESSMENT OF VASCULAR PERFUSION with PMHx significant for but not limited to PAD, peripheral neurpathy, DM II, cataracts  Clinical Impression  Pt admitted with above diagnosis. Pt currently with functional limitations due to the deficits listed below (see PT Problem List).  Pt will benefit from skilled PT to increase their independence and safety with mobility to allow discharge to the venue listed below.  Pt ambulated in hallway and tolerated good distance.  Pt very motivated to return to previous independence.  Pt with SPO2 89-90% on room air end of ambulation however monitor reading poor perfusion (RN notified).  Pt will likely progress well and no d/c needs anticipated.     Follow Up Recommendations No PT follow up    Equipment Recommendations  None recommended by PT    Recommendations for Other Services       Precautions / Restrictions Precautions Precautions: Fall Precaution Comments: ileostomy Restrictions Weight Bearing Restrictions: No      Mobility  Bed Mobility Overal bed mobility: Needs Assistance Bed Mobility: Rolling;Sidelying to Sit Rolling: Supervision Sidelying to sit: Min assist       General bed mobility comments: pt up in recliner  Transfers Overall transfer level: Needs assistance Equipment used: Rolling walker (2 wheeled) Transfers: Sit to/from Stand Sit to Stand: Min guard         General transfer comment: min/guard for safety, cues for UE assist and controlling descent  Ambulation/Gait Ambulation/Gait assistance: Min guard Gait Distance (Feet): 240 Feet Assistive device: Rolling walker (2 wheeled) Gait Pattern/deviations: Step-through  pattern;Decreased stride length     General Gait Details: slow but steady gait with RW, typically uses cane; tolerated distance well  Stairs            Wheelchair Mobility    Modified Rankin (Stroke Patients Only)       Balance Overall balance assessment: Needs assistance Sitting-balance support: No upper extremity supported;Feet supported Sitting balance-Leahy Scale: Fair     Standing balance support: No upper extremity supported Standing balance-Leahy Scale: Fair Standing balance comment: Able to statically stand without upper extremity support                             Pertinent Vitals/Pain Pain Assessment: 0-10 Pain Score: 4  Pain Location: abdomen Pain Descriptors / Indicators: Discomfort;Sore Pain Intervention(s): Monitored during session;Repositioned    Home Living Family/patient expects to be discharged to:: Private residence Living Arrangements: Alone Available Help at Discharge: Family;Available PRN/intermittently Type of Home: Apartment Home Access: Level entry     Home Layout: One level Home Equipment: Shower seat;Cane - single point;Walker - 4 wheels      Prior Function Level of Independence: Independent with assistive device(s)         Comments: typically uses cane     Hand Dominance   Dominant Hand: Right    Extremity/Trunk Assessment   Upper Extremity Assessment Upper Extremity Assessment: Generalized weakness    Lower Extremity Assessment Lower Extremity Assessment: Generalized weakness       Communication   Communication: No difficulties  Cognition Arousal/Alertness: Awake/alert Behavior During Therapy: WFL for tasks assessed/performed Overall Cognitive Status: Within Functional Limits for tasks assessed  General Comments General comments (skin integrity, edema, etc.): Pt very motivated to improve independence    Exercises     Assessment/Plan     PT Assessment Patient needs continued PT services  PT Problem List Decreased strength;Decreased balance;Decreased activity tolerance;Decreased mobility       PT Treatment Interventions Therapeutic exercise;Gait training;DME instruction;Balance training;Functional mobility training;Therapeutic activities    PT Goals (Current goals can be found in the Care Plan section)  Acute Rehab PT Goals Patient Stated Goal: to get stronger PT Goal Formulation: With patient Time For Goal Achievement: 08/18/18 Potential to Achieve Goals: Good    Frequency Min 3X/week   Barriers to discharge        Co-evaluation               AM-PAC PT "6 Clicks" Mobility  Outcome Measure Help needed turning from your back to your side while in a flat bed without using bedrails?: A Little Help needed moving from lying on your back to sitting on the side of a flat bed without using bedrails?: A Little Help needed moving to and from a bed to a chair (including a wheelchair)?: A Little Help needed standing up from a chair using your arms (e.g., wheelchair or bedside chair)?: A Little Help needed to walk in hospital room?: A Little Help needed climbing 3-5 steps with a railing? : A Little 6 Click Score: 18    End of Session   Activity Tolerance: Patient tolerated treatment well Patient left: in chair;with call bell/phone within reach Nurse Communication: Mobility status PT Visit Diagnosis: Difficulty in walking, not elsewhere classified (R26.2)    Time: 8850-2774 PT Time Calculation (min) (ACUTE ONLY): 16 min   Charges:   PT Evaluation $PT Eval Low Complexity: [place], PT, DPT Acute Rehabilitation Services Office: [phone] Pager: [phone]   Trena Platt 08/04/2018, 12:14 PM

## 2018-08-04 NOTE — Progress Notes (Signed)
1 Day Post-Op   Subjective/Chief Complaint: No complaints Denies nausea Pain controlled   Objective: Vital signs in last 24 hours: Temp:  [96.1 F (35.6 C)-100.4 F (38 C)] 100.1 F (37.8 C) (07/25 0537) Pulse Rate:  [79-96] 92 (07/25 0537) Resp:  [7-18] 17 (07/25 0537) BP: (79-193)/(60-87) 143/62 (07/25 0537) SpO2:  [93 %-100 %] 97 % (07/25 0537) Weight:  [74 kg] 74 kg (07/24 1551) Last BM Date: 08/04/18  Intake/Output from previous day: 07/24 0701 - 07/25 0700 In: 5071 [P.O.:240; I.V.:4631; IV Piggyback:200] Out: 6767 [Urine:1905; Drains:1060; Stool:350; Blood:100] Intake/Output this shift: Total I/O In: 240 [P.O.:240] Out: 240 [Urine:200; Drains:40]  Exam: Awake and alert, up in a chair Lungs clear Abdomen soft, ostomy pink, drain serosang  Lab Results:  Recent Labs    08/01/18 1022 08/04/18 0421  WBC 3.3* 4.8  HGB 10.4* 8.6*  HCT 33.8* 26.6*  PLT 301 236   BMET Recent Labs    08/01/18 1022 08/04/18 0421  NA 141 136  K 4.5 3.6  CL 104 102  CO2 27 25  GLUCOSE 126* 176*  BUN 16 10  CREATININE 0.87 0.83  CALCIUM 9.7 8.5*   PT/INR No results for input(s): LABPROT, INR in the last 72 hours. ABG No results for input(s): PHART, HCO3 in the last 72 hours.  Invalid input(s): PCO2, PO2  Studies/Results: No results found.  Anti-infectives: Anti-infectives (From admission, onward)   Start     Dose/Rate Route Frequency Ordered Stop   08/03/18 2000  cefoTEtan (CEFOTAN) 2 g in sodium chloride 0.9 % 100 mL IVPB     2 g 200 mL/hr over 30 Minutes Intravenous Every 12 hours 08/03/18 1609 08/03/18 2004   08/03/18 0600  cefoTEtan (CEFOTAN) 2 g in sodium chloride 0.9 % 100 mL IVPB     2 g 200 mL/hr over 30 Minutes Intravenous On call to O.R. 08/03/18 2094 08/03/18 7096      Assessment/Plan: s/p Procedure(s): XI ROBOTIC ASSISTED LOWER ANTERIOR RESECTION; DIAGNOSTIC FLEXIBLE SIGMOIDOSCOPY; INTRAOPERATIVE ASSESSMENT OF VASCULAR PERFUSION (N/A) UMBILICAL  HERNIA REPAIR (N/A) DIVERTING LOOP ILEOSTOMY (N/A)  Stable post op D/c foley tomorrow Clear liquids Follow Hgb  LOS: 1 day    Coralie Keens 08/04/2018

## 2018-08-04 NOTE — Progress Notes (Signed)
Pharmacy Brief Note - Alvimopan (Entereg)  The standing order set for alvimopan (Entereg) now includes an automatic order to discontinue the drug after the patient has had a bowel movement. The change was approved by the [place] and the Medical Executive Committee.  This patient has had a bowel movement documented by nursing. Therefore, alvimopan has been discontinued. If there are questions, please contact the pharmacy at [phone].  Thank you   Reuel Boom, PharmD, BCPS [phone] 08/04/2018, 9:09 AM

## 2018-08-05 LAB — CBC
HCT: 25.1 % — ABNORMAL LOW (ref 36.0–46.0)
Hemoglobin: 8.2 g/dL — ABNORMAL LOW (ref 12.0–15.0)
MCH: 31.3 pg (ref 26.0–34.0)
MCHC: 32.7 g/dL (ref 30.0–36.0)
MCV: 95.8 fL (ref 80.0–100.0)
Platelets: 230 10*3/uL (ref 150–400)
RBC: 2.62 MIL/uL — ABNORMAL LOW (ref 3.87–5.11)
RDW: 12.6 % (ref 11.5–15.5)
WBC: 5 10*3/uL (ref 4.0–10.5)
nRBC: 0 % (ref 0.0–0.2)

## 2018-08-05 LAB — BASIC METABOLIC PANEL
Anion gap: 9 (ref 5–15)
BUN: 12 mg/dL (ref 8–23)
CO2: 25 mmol/L (ref 22–32)
Calcium: 8.7 mg/dL — ABNORMAL LOW (ref 8.9–10.3)
Chloride: 102 mmol/L (ref 98–111)
Creatinine, Ser: 0.88 mg/dL (ref 0.44–1.00)
GFR calc Af Amer: >60 mL/min (ref >60)
GFR calc non Af Amer: >60 mL/min (ref >60)
Glucose, Bld: 153 mg/dL — ABNORMAL HIGH (ref 70–99)
Potassium: 3.5 mmol/L (ref 3.5–5.1)
Sodium: 136 mmol/L (ref 135–145)

## 2018-08-05 LAB — GLUCOSE, CAPILLARY
Glucose-Capillary: 151 mg/dL — ABNORMAL HIGH (ref 70–99)
Glucose-Capillary: 174 mg/dL — ABNORMAL HIGH (ref 70–99)
Glucose-Capillary: 88 mg/dL (ref 70–99)
Glucose-Capillary: 153 mg/dL — ABNORMAL HIGH (ref 70–99)

## 2018-08-05 NOTE — Progress Notes (Signed)
2 Days Post-Op   Subjective/Chief Complaint: No complaints Tolerating po Pain well controlled ambulating   Objective: Vital signs in last 24 hours: Temp:  [98.5 F (36.9 C)-99.9 F (37.7 C)] 98.9 F (37.2 C) (07/26 0519) Pulse Rate:  [86-95] 86 (07/26 0519) Resp:  [17-20] 20 (07/26 0519) BP: (138-161)/(61-68) 138/63 (07/26 0519) SpO2:  [96 %-98 %] 96 % (07/26 0519) Last BM Date: 08/04/18  Intake/Output from previous day: 07/25 0701 - 07/26 0700 In: 1170.8 [P.O.:1140; I.V.:30.8] Out: 3165 [Urine:1990; Drains:125; AJOIN:8676] Intake/Output this shift: No intake/output data recorded.  Exam: Awake and alert Looks comfortable Abdomen soft, drain serosang, ostomy working well, non-distended  Lab Results:  Recent Labs    08/04/18 0421 08/05/18 0435  WBC 4.8 5.0  HGB 8.6* 8.2*  HCT 26.6* 25.1*  PLT 236 230   BMET Recent Labs    08/04/18 0421 08/05/18 0435  NA 136 136  K 3.6 3.5  CL 102 102  CO2 25 25  GLUCOSE 176* 153*  BUN 10 12  CREATININE 0.83 0.88  CALCIUM 8.5* 8.7*   PT/INR No results for input(s): LABPROT, INR in the last 72 hours. ABG No results for input(s): PHART, HCO3 in the last 72 hours.  Invalid input(s): PCO2, PO2  Studies/Results: No results found.  Anti-infectives: Anti-infectives (From admission, onward)   Start     Dose/Rate Route Frequency Ordered Stop   08/03/18 2000  cefoTEtan (CEFOTAN) 2 g in sodium chloride 0.9 % 100 mL IVPB     2 g 200 mL/hr over 30 Minutes Intravenous Every 12 hours 08/03/18 1609 08/03/18 2004   08/03/18 0600  cefoTEtan (CEFOTAN) 2 g in sodium chloride 0.9 % 100 mL IVPB     2 g 200 mL/hr over 30 Minutes Intravenous On call to O.R. 08/03/18 7209 08/03/18 4709      Assessment/Plan: s/p Procedure(s): XI ROBOTIC ASSISTED LOWER ANTERIOR RESECTION; DIAGNOSTIC FLEXIBLE SIGMOIDOSCOPY; INTRAOPERATIVE ASSESSMENT OF VASCULAR PERFUSION (N/A) UMBILICAL HERNIA REPAIR (N/A) DIVERTING LOOP ILEOSTOMY  (N/A)  Advancing diet Ambulate Continue JP drain   LOS: 2 days    Coralie Keens 08/05/2018

## 2018-08-05 NOTE — Progress Notes (Signed)
Dr. Barry Dienes aware via phone pt's blood sugar 88. Clarified with MD whether to give po hypoglycemic meds. See new order to hold po diabetic meds.

## 2018-08-05 NOTE — Progress Notes (Signed)
Nutrition Brief Note  Patient identified on the Malnutrition Screening Tool (MST) Report  Patient with no weight loss. Patient tolerating fulls at this time.  Wt Readings from Last 15 Encounters:  08/03/18 74 kg  08/01/18 74 kg  07/09/18 72.7 kg  06/14/18 69.9 kg  06/11/18 70.6 kg  06/07/18 69.9 kg  05/14/18 69.5 kg  05/07/18 69.6 kg  04/30/18 68.9 kg  04/16/18 70.6 kg  04/02/18 70.5 kg  03/21/18 71.7 kg  03/21/18 71.5 kg  03/07/18 71.5 kg  02/21/18 72.4 kg    Body mass index is 26.33 kg/m. Patient meets criteria for overweight based on current BMI.   Current diet order is full liquids, patient is consuming approximately 85% of meals at this time. Labs and medications reviewed.   No nutrition interventions warranted at this time. If nutrition issues arise, please consult RD.   Clayton Bibles, MS, RD, [place] Dietitian Pager: [phone] After Hours Pager: [phone]

## 2018-08-06 LAB — CBC
HCT: 27.3 % — ABNORMAL LOW (ref 36.0–46.0)
Hemoglobin: 8.7 g/dL — ABNORMAL LOW (ref 12.0–15.0)
MCH: 30.5 pg (ref 26.0–34.0)
MCHC: 31.9 g/dL (ref 30.0–36.0)
MCV: 95.8 fL (ref 80.0–100.0)
Platelets: 272 10*3/uL (ref 150–400)
RBC: 2.85 MIL/uL — ABNORMAL LOW (ref 3.87–5.11)
RDW: 12.4 % (ref 11.5–15.5)
WBC: 5 10*3/uL (ref 4.0–10.5)
nRBC: 0 % (ref 0.0–0.2)

## 2018-08-06 LAB — BASIC METABOLIC PANEL
Anion gap: 10 (ref 5–15)
BUN: 22 mg/dL (ref 8–23)
CO2: 27 mmol/L (ref 22–32)
Calcium: 9.3 mg/dL (ref 8.9–10.3)
Chloride: 98 mmol/L (ref 98–111)
Creatinine, Ser: 1.24 mg/dL — ABNORMAL HIGH (ref 0.44–1.00)
GFR calc Af Amer: 51 mL/min — ABNORMAL LOW (ref >60)
GFR calc non Af Amer: 44 mL/min — ABNORMAL LOW (ref >60)
Glucose, Bld: 196 mg/dL — ABNORMAL HIGH (ref 70–99)
Potassium: 3.8 mmol/L (ref 3.5–5.1)
Sodium: 135 mmol/L (ref 135–145)

## 2018-08-06 LAB — GLUCOSE, CAPILLARY
Glucose-Capillary: 181 mg/dL — ABNORMAL HIGH (ref 70–99)
Glucose-Capillary: 124 mg/dL — ABNORMAL HIGH (ref 70–99)
Glucose-Capillary: 170 mg/dL — ABNORMAL HIGH (ref 70–99)
Glucose-Capillary: 157 mg/dL — ABNORMAL HIGH (ref 70–99)

## 2018-08-06 MED ORDER — SODIUM CHLORIDE 0.9 % IV BOLUS
500.0000 mL | Freq: Once | INTRAVENOUS | Status: AC
  Administered 2018-08-06: 500 mL via INTRAVENOUS

## 2018-08-06 MED ORDER — LOPERAMIDE HCL 2 MG PO CAPS
2.0000 mg | ORAL_CAPSULE | Freq: Three times a day (TID) | ORAL | Status: DC
  Administered 2018-08-06 – 2018-08-07 (×4): 2 mg via ORAL
  Filled 2018-08-06 (×4): qty 1

## 2018-08-06 MED ORDER — LACTATED RINGERS IV BOLUS
1000.0000 mL | Freq: Once | INTRAVENOUS | Status: AC
  Administered 2018-08-06: 09:00:00 1000 mL via INTRAVENOUS

## 2018-08-06 NOTE — Progress Notes (Signed)
Physical Therapy Treatment Patient Details Name: Nicole Sellers MRN: [id number] DOB: [birth year] Today's Date: 08/06/2018    History of Present Illness Pt is a 68 y.o. female with colorectal cancer now s/p XI ROBOTIC ASSISTED LOWER ANTERIOR RESECTION; DIAGNOSTIC FLEXIBLE SIGMOIDOSCOPY; INTRAOPERATIVE ASSESSMENT OF VASCULAR PERFUSION with PMHx significant for but not limited to PAD, peripheral neurpathy, DM II, cataracts    PT Comments    Pt agreeable to OOB mobility this session, although pt reporting abdominal pain as moderate. Pt ambulated good hallway distance with cane this session, at times presenting with mild unsteadiness. Pt with continued difficulty with bed mobility due to LE weakness and abdominal pain. PT updating post-acute recommendation to reflect HHPT to address pt mobility deficits in the home, as pt lives alone. PT to continue to follow acutely.   Follow Up Recommendations  Home health PT     Equipment Recommendations  None recommended by PT    Recommendations for Other Services       Precautions / Restrictions Precautions Precautions: Fall Precaution Comments: ileostomy Restrictions Weight Bearing Restrictions: No    Mobility  Bed Mobility Overal bed mobility: Needs Assistance Bed Mobility: Rolling;Sit to Sidelying Rolling: Supervision       Sit to sidelying: Min assist;HOB elevated General bed mobility comments: supervision for rolling with increased time, min assist for LE lifting for sit to sidelying and positioning in bed.  Transfers Overall transfer level: Needs assistance Equipment used: Rolling walker (2 wheeled) Transfers: Sit to/from Stand Sit to Stand: Supervision         General transfer comment: supervision for safety, increased time to rise.  Ambulation/Gait Ambulation/Gait assistance: Min guard Gait Distance (Feet): 450 Feet Assistive device: Straight cane Gait Pattern/deviations: Step-through pattern;Decreased stride  length;Trunk flexed Gait velocity: decr   General Gait Details: slow gait with pt holding abdomen with one hand due to abdominal pain. Verbal cuing for bringing cane forward each time pt steps L foot forward.   Stairs             Wheelchair Mobility    Modified Rankin (Stroke Patients Only)       Balance Overall balance assessment: Needs assistance Sitting-balance support: No upper extremity supported;Feet supported Sitting balance-Leahy Scale: Fair     Standing balance support: Bilateral upper extremity supported Standing balance-Leahy Scale: Fair Standing balance comment: Able to statically stand without upper extremity support                            Cognition Arousal/Alertness: Awake/alert Behavior During Therapy: WFL for tasks assessed/performed Overall Cognitive Status: Within Functional Limits for tasks assessed                                        Exercises      General Comments        Pertinent Vitals/Pain Pain Assessment: 0-10 Pain Score: 4  Pain Location: abdomen Pain Descriptors / Indicators: Discomfort;Sore Pain Intervention(s): Limited activity within patient's tolerance;Monitored during session;Repositioned    Home Living                      Prior Function            PT Goals (current goals can now be found in the care plan section) Acute Rehab PT Goals Patient Stated Goal: to get stronger PT Goal Formulation:  With patient Time For Goal Achievement: 08/18/18 Potential to Achieve Goals: Good Progress towards PT goals: Progressing toward goals    Frequency    Min 3X/week      PT Plan Discharge plan needs to be updated    Co-evaluation              AM-PAC PT "6 Clicks" Mobility   Outcome Measure  Help needed turning from your back to your side while in a flat bed without using bedrails?: A Little Help needed moving from lying on your back to sitting on the side of a flat bed  without using bedrails?: A Little Help needed moving to and from a bed to a chair (including a wheelchair)?: A Little Help needed standing up from a chair using your arms (e.g., wheelchair or bedside chair)?: A Little Help needed to walk in hospital room?: A Little Help needed climbing 3-5 steps with a railing? : A Little 6 Click Score: 18    End of Session   Activity Tolerance: Patient tolerated treatment well;Patient limited by fatigue Patient left: with call bell/phone within reach;in bed Nurse Communication: Mobility status PT Visit Diagnosis: Difficulty in walking, not elsewhere classified (R26.2)     Time: 8185-9093 PT Time Calculation (min) (ACUTE ONLY): 13 min  Charges:  $Gait Training: 8-22 mins                    Julien Girt, PT Acute Rehabilitation Services Pager [phone]  Office [phone]   Chane Magner D Dajane Valli 08/06/2018, 1:32 PM

## 2018-08-06 NOTE — Progress Notes (Signed)
Occupational Therapy Treatment Patient Details Name: Nicole Sellers MRN: [id number] DOB: [birth year] Today's Date: 08/06/2018    History of present illness Pt is a 68 y.o. female with colorectal cancer now s/p XI ROBOTIC ASSISTED LOWER ANTERIOR RESECTION; DIAGNOSTIC FLEXIBLE SIGMOIDOSCOPY; INTRAOPERATIVE ASSESSMENT OF VASCULAR PERFUSION with PMHx significant for but not limited to PAD, peripheral neurpathy, DM II, cataracts   OT comments  Pt very motivated and pleasant  Follow Up Recommendations  Home health OT;Supervision - Intermittent    Equipment Recommendations  None recommended by OT    Recommendations for Other Services      Precautions / Restrictions Precautions Precautions: Fall Precaution Comments: ileostomy Restrictions Weight Bearing Restrictions: No       Mobility Bed Mobility               General bed mobility comments: pt OOB  Transfers Overall transfer level: Needs assistance Equipment used: Rolling walker (2 wheeled) Transfers: Sit to/from [facility] Sit to Stand: Min guard Stand pivot transfers: Min guard            Balance Overall balance assessment: Needs assistance Sitting-balance support: No upper extremity supported;Feet supported Sitting balance-Leahy Scale: Fair     Standing balance support: Bilateral upper extremity supported Standing balance-Leahy Scale: Fair Standing balance comment: Able to statically stand without upper extremity support                           ADL either performed or assessed with clinical judgement   ADL Overall ADL's : Needs assistance/impaired     Grooming: Wash/dry hands;Oral care;Wash/dry face;Standing;Min guard           Upper Body Dressing : Set up   Lower Body Dressing: Moderate assistance;Cueing for compensatory techniques;Sit to/from stand   Toilet Transfer: Min guard;Cueing for safety;Cueing for Soil scientist;Ambulation   Toileting-  Clothing Manipulation and Hygiene: Minimal assistance;Sit to/from stand;Cueing for safety Toileting - Clothing Manipulation Details (indicate cue type and reason): pt reports she emptyed her bag this morning.       General ADL Comments: pt very open to therapy and OT suggestions. Pt very motivated!     Vision Baseline Vision/History: Wears glasses            Cognition Arousal/Alertness: Awake/alert Behavior During Therapy: WFL for tasks assessed/performed Overall Cognitive Status: Within Functional Limits for tasks assessed                                                     Pertinent Vitals/ Pain       Pain Score: 3  Pain Location: abdomen Pain Descriptors / Indicators: Discomfort;Sore Pain Intervention(s): Limited activity within patient's tolerance;Monitored during session     Prior Functioning/Environment              Frequency  Min 2X/week        Progress Toward Goals  OT Goals(current goals can now be found in the care plan section)  Progress towards OT goals: Progressing toward goals     Plan Discharge plan remains appropriate       AM-PAC OT "6 Clicks" Daily Activity     Outcome Measure   Help from another person eating meals?: None Help from another person taking care of personal grooming?: A Little Help from another person toileting,  which includes using toliet, bedpan, or urinal?: A Little Help from another person bathing (including washing, rinsing, drying)?: A Little Help from another person to put on and taking off regular upper body clothing?: A Little Help from another person to put on and taking off regular lower body clothing?: A Little 6 Click Score: 19    End of Session Equipment Utilized During Treatment: Rolling walker  OT Visit Diagnosis: Other abnormalities of gait and mobility (R26.89);Pain Pain - part of body: (abdominal)   Activity Tolerance Patient tolerated treatment well   Patient Left in  chair;with call bell/phone within reach;with chair alarm set   Nurse Communication Mobility status(needs new batteries for chair alarm)        Time: 4144-3601 OT Time Calculation (min): 21 min  Charges: OT General Charges $OT Visit: 1 Visit OT Treatments $Self Care/Home Management : 8-22 mins  Kari Baars, [place] [phone] Office- [phone]      Melora Menon, Edwena Felty D 08/06/2018, 5:40 PM

## 2018-08-06 NOTE — Consult Note (Addendum)
[facility] Nurse ostomy consult note Stoma type/location:  Pt had ileostomy surgery performed on 7/24. Dr Marcello Moores at the bedside to assess ostomy appearance. Stomal assessment/size: Stoma is red and viable, above skin level, 1 1/2 inches, rubber rod in place Peristomal assessment:  Intact skin surrounding Output: 100cc liquid green stool  Ostomy pouching: 1pc.  Education provided:  Demonstrated pouch change; pt watched and assisted.  She was able to open and close the velcro to empty without assistance.  She states she has neuropathy and will not be able to cut an opening; will order precut pouching samples sent to the patient's house. Reviewed dietary precautions and importance of avoiding dehydration. She will need home health for pouching assistance until she is able to change pouch without assistance.  5 sets of one piece pouches left in the room, with educational materials at the bedside.  She states there are no family members who will be participating in teaching sessions or helping provide care after discharge.   Enrolled patient in [place] program: Yes Julien Girt MSN, RN, [place], [place], [place]

## 2018-08-06 NOTE — Progress Notes (Signed)
3 Days Post-Op   Subjective/Chief Complaint: No complaints Tolerating po Pain well controlled Ambulating Having lots of ostomy output, UOP low   Objective: Vital signs in last 24 hours: Temp:  [98.6 F (37 C)-99.2 F (37.3 C)] 98.6 F (37 C) (07/27 5056) Pulse Rate:  [79-94] 94 (07/27 0608) Resp:  [16-17] 16 (07/27 0608) BP: (121-138)/(60-67) 137/67 (07/27 0608) SpO2:  [98 %-99 %] 99 % (07/27 9794) Weight:  [70.6 kg] 70.6 kg (07/27 0700) Last BM Date: 08/05/18  Intake/Output from previous day: 07/26 0701 - 07/27 0700 In: 1100 [P.O.:600; IV Piggyback:500] Out: 2815 [Urine:300; Drains:90; Stool:2425] Intake/Output this shift: No intake/output data recorded.  Exam: Awake and alert Looks comfortable Abdomen soft, drain serosang, ostomy with clear fluid, non-distended  Lab Results:  Recent Labs    08/05/18 0435 08/06/18 0343  WBC 5.0 5.0  HGB 8.2* 8.7*  HCT 25.1* 27.3*  PLT 230 272   BMET Recent Labs    08/05/18 0435 08/06/18 0343  NA 136 135  K 3.5 3.8  CL 102 98  CO2 25 27  GLUCOSE 153* 196*  BUN 12 22  CREATININE 0.88 1.24*  CALCIUM 8.7* 9.3   PT/INR No results for input(s): LABPROT, INR in the last 72 hours. ABG No results for input(s): PHART, HCO3 in the last 72 hours.  Invalid input(s): PCO2, PO2  Studies/Results: No results found.  Anti-infectives: Anti-infectives (From admission, onward)   Start     Dose/Rate Route Frequency Ordered Stop   08/03/18 2000  cefoTEtan (CEFOTAN) 2 g in sodium chloride 0.9 % 100 mL IVPB     2 g 200 mL/hr over 30 Minutes Intravenous Every 12 hours 08/03/18 1609 08/03/18 2004   08/03/18 0600  cefoTEtan (CEFOTAN) 2 g in sodium chloride 0.9 % 100 mL IVPB     2 g 200 mL/hr over 30 Minutes Intravenous On call to O.R. 08/03/18 8016 08/03/18 5537      Assessment/Plan: s/p Procedure(s): XI ROBOTIC ASSISTED LOWER ANTERIOR RESECTION; DIAGNOSTIC FLEXIBLE SIGMOIDOSCOPY; INTRAOPERATIVE ASSESSMENT OF VASCULAR PERFUSION  (N/A) UMBILICAL HERNIA REPAIR (N/A) DIVERTING LOOP ILEOSTOMY (N/A)  Advance diet to soft foods Ambulate Continue JP drain Imodium before meals Ostomy teaching Bolus 1 L for low UOP   LOS: 3 days    Rosario Adie 4/82/7078

## 2018-08-06 NOTE — Care Management Important Message (Signed)
Important Message  Patient Details IM Letter given to Velva Harman RN to present to the Patient Name: Nicole Sellers MRN: [id number] Date of Birth: [birth year]   Medicare Important Message Given:  Yes     Kerin Salen 08/06/2018, 10:12 AM

## 2018-08-07 LAB — GLUCOSE, CAPILLARY
Glucose-Capillary: 175 mg/dL — ABNORMAL HIGH (ref 70–99)
Glucose-Capillary: 119 mg/dL — ABNORMAL HIGH (ref 70–99)
Glucose-Capillary: 233 mg/dL — ABNORMAL HIGH (ref 70–99)
Glucose-Capillary: 93 mg/dL (ref 70–99)

## 2018-08-07 MED ORDER — CALCIUM POLYCARBOPHIL 625 MG PO TABS
625.0000 mg | ORAL_TABLET | Freq: Three times a day (TID) | ORAL | Status: DC
  Administered 2018-08-07 – 2018-08-09 (×7): 625 mg via ORAL
  Filled 2018-08-07 (×10): qty 1

## 2018-08-07 MED ORDER — LOPERAMIDE HCL 2 MG PO CAPS
4.0000 mg | ORAL_CAPSULE | Freq: Three times a day (TID) | ORAL | Status: DC
  Administered 2018-08-07 – 2018-08-08 (×5): 4 mg via ORAL
  Filled 2018-08-07 (×5): qty 2

## 2018-08-07 NOTE — Progress Notes (Addendum)
4 Days Post-Op RObotic LAR, diverting ostomy  Subjective/Chief Complaint: No complaints Tolerating po Pain well controlled Ambulating Having lots of ostomy output, UOP better with bolus   Objective: Vital signs in last 24 hours: Temp:  [98.7 F (37.1 C)-99.4 F (37.4 C)] 99.4 F (37.4 C) (07/28 0516) Pulse Rate:  [81-95] 87 (07/28 0516) Resp:  [16-18] 16 (07/28 0516) BP: (156-163)/(57-70) 161/64 (07/28 0516) SpO2:  [98 %-100 %] 100 % (07/28 0516) Weight:  [71.6 kg] 71.6 kg (07/28 0500) Last BM Date: 08/07/18  Intake/Output from previous day: 07/27 0701 - 07/28 0700 In: 2980.7 [P.O.:857; I.V.:2073.7; IV Piggyback:50] Out: 7026 [VZCHY:8502; Drains:25; DXAJO:8786] Intake/Output this shift: No intake/output data recorded.  Exam: Awake and alert Looks comfortable Abdomen soft, drain serosang, ostomy with clear fluid, non-distended  Lab Results:  Recent Labs    08/05/18 0435 08/06/18 0343  WBC 5.0 5.0  HGB 8.2* 8.7*  HCT 25.1* 27.3*  PLT 230 272   BMET Recent Labs    08/05/18 0435 08/06/18 0343  NA 136 135  K 3.5 3.8  CL 102 98  CO2 25 27  GLUCOSE 153* 196*  BUN 12 22  CREATININE 0.88 1.24*  CALCIUM 8.7* 9.3   PT/INR No results for input(s): LABPROT, INR in the last 72 hours. ABG No results for input(s): PHART, HCO3 in the last 72 hours.  Invalid input(s): PCO2, PO2  Studies/Results: No results found.  Anti-infectives: Anti-infectives (From admission, onward)   Start     Dose/Rate Route Frequency Ordered Stop   08/03/18 2000  cefoTEtan (CEFOTAN) 2 g in sodium chloride 0.9 % 100 mL IVPB     2 g 200 mL/hr over 30 Minutes Intravenous Every 12 hours 08/03/18 1609 08/03/18 2004   08/03/18 0600  cefoTEtan (CEFOTAN) 2 g in sodium chloride 0.9 % 100 mL IVPB     2 g 200 mL/hr over 30 Minutes Intravenous On call to O.R. 08/03/18 7672 08/03/18 0808      Assessment/Plan: s/p Procedure(s): XI ROBOTIC ASSISTED LOWER ANTERIOR RESECTION; DIAGNOSTIC  FLEXIBLE SIGMOIDOSCOPY; INTRAOPERATIVE ASSESSMENT OF VASCULAR PERFUSION (N/A) UMBILICAL HERNIA REPAIR (N/A) DIVERTING LOOP ILEOSTOMY (N/A)  Cont soft foods Ambulate D/C JP drain Increase Imodium dosage Cont ostomy teaching Cont IVF's   LOS: 4 days    Nicole Sellers 0/94/7096

## 2018-08-08 LAB — GLUCOSE, CAPILLARY
Glucose-Capillary: 160 mg/dL — ABNORMAL HIGH (ref 70–99)
Glucose-Capillary: 110 mg/dL — ABNORMAL HIGH (ref 70–99)
Glucose-Capillary: 164 mg/dL — ABNORMAL HIGH (ref 70–99)
Glucose-Capillary: 98 mg/dL (ref 70–99)

## 2018-08-08 MED ORDER — LOPERAMIDE HCL 2 MG PO CAPS
4.0000 mg | ORAL_CAPSULE | Freq: Three times a day (TID) | ORAL | Status: DC
  Administered 2018-08-08 – 2018-08-09 (×4): 4 mg via ORAL
  Filled 2018-08-08 (×4): qty 2

## 2018-08-08 NOTE — Progress Notes (Signed)
OT Cancellation Note  Patient Details Name: MERRIL ISAKSON MRN: [id number] DOB: 29-Apr-1950   Cancelled Treatment:    Reason Eval/Treat Not Completed: Other (comment)  Pt feeling nauseous this day. Pt ask OT to return later or next day  Kari Baars, [place] [phone] Office- [phone], Thereasa Parkin 08/08/2018, 12:56 PM

## 2018-08-08 NOTE — Progress Notes (Signed)
5 Days Post-Op RObotic LAR, diverting ostomy  Subjective/Chief Complaint: No complaints Tolerating po Pain well controlled Ambulating Having less ostomy output, UOP good with IVF's   Objective: Vital signs in last 24 hours: Temp:  [97.5 F (36.4 C)-98.7 F (37.1 C)] 98.7 F (37.1 C) (07/29 0449) Pulse Rate:  [86-92] 86 (07/29 0449) Resp:  [17-20] 20 (07/29 0449) BP: (125-154)/(53-67) 144/67 (07/29 0449) SpO2:  [99 %-100 %] 99 % (07/29 0449) Weight:  [72.6 kg] 72.6 kg (07/29 0500) Last BM Date: 08/08/18  Intake/Output from previous day: 07/28 0701 - 07/29 0700 In: 3202.7 [P.O.:900; I.V.:2302.7] Out: 2550 [Urine:1250; ZPHXT:0569] Intake/Output this shift: No intake/output data recorded.  Exam: Awake and alert Looks comfortable Abdomen soft, ostomy with ticker fluid, non-distended  Lab Results:  Recent Labs    08/06/18 0343  WBC 5.0  HGB 8.7*  HCT 27.3*  PLT 272   BMET Recent Labs    08/06/18 0343  NA 135  K 3.8  CL 98  CO2 27  GLUCOSE 196*  BUN 22  CREATININE 1.24*  CALCIUM 9.3   PT/INR No results for input(s): LABPROT, INR in the last 72 hours. ABG No results for input(s): PHART, HCO3 in the last 72 hours.  Invalid input(s): PCO2, PO2  Studies/Results: No results found.  Anti-infectives: Anti-infectives (From admission, onward)   Start     Dose/Rate Route Frequency Ordered Stop   08/03/18 2000  cefoTEtan (CEFOTAN) 2 g in sodium chloride 0.9 % 100 mL IVPB     2 g 200 mL/hr over 30 Minutes Intravenous Every 12 hours 08/03/18 1609 08/03/18 2004   08/03/18 0600  cefoTEtan (CEFOTAN) 2 g in sodium chloride 0.9 % 100 mL IVPB     2 g 200 mL/hr over 30 Minutes Intravenous On call to O.R. 08/03/18 7948 08/03/18 0165      Assessment/Plan: s/p Procedure(s): XI ROBOTIC ASSISTED LOWER ANTERIOR RESECTION; DIAGNOSTIC FLEXIBLE SIGMOIDOSCOPY; INTRAOPERATIVE ASSESSMENT OF VASCULAR PERFUSION (N/A) UMBILICAL HERNIA REPAIR (N/A) DIVERTING LOOP ILEOSTOMY  (N/A)  Cont soft foods Ambulate Cont Imodium  Cont ostomy teaching SL IVF's   LOS: 5 days    Rosario Adie 5/37/4827

## 2018-08-08 NOTE — Progress Notes (Signed)
PT Cancellation Note  Patient Details Name: Nicole Sellers MRN: [id number] DOB: 06-15-1950   Cancelled Treatment:     Pt declined therapy today due to nausea. Encouraged her to participate due to the possibility of d/c tomorrow, but she politely declined.    Lelon Mast 08/08/2018, 12:42 PM

## 2018-08-08 NOTE — Consult Note (Addendum)
[facility] Nurse ostomy consult note Stoma type/location:  Pt had ileostomy surgery performed on 7/24. Rod was removed yesterday, according to the patient. Stoma is red and viable, above skin level, 1 1/2 inches, Peristomal assessment:  Intact skin surrounding Output: 50cc liquid green stool  Ostomy pouching: 1pc.  Education provided:  Demonstrated pouch change; using one piece pouch and barrier ring to assist with maintaining a seal. Pt assisted. She was able to open and close the velcro to empty without assistance.  She states she has neuropathy and will not be able to cut an opening; ordered precut pouching samples sent to the patient's house. Reviewed dietary precautions and importance of avoiding dehydration. She will need home health for pouching assistance until she is able to change pouch without assistance.  5 sets of one piece pouches and barrier rings left in the room, with educational materials at the bedside. She states there are no family members who will be participating in teaching sessions or helping provide care after discharge.   Enrolled patient in [place] program: Yes Julien Girt MSN, RN, [place], [place], [place]

## 2018-08-09 LAB — GLUCOSE, CAPILLARY
Glucose-Capillary: 125 mg/dL — ABNORMAL HIGH (ref 70–99)
Glucose-Capillary: 136 mg/dL — ABNORMAL HIGH (ref 70–99)

## 2018-08-09 MED ORDER — LOPERAMIDE HCL 2 MG PO CAPS: 4.0000 mg | ORAL_CAPSULE | Freq: Three times a day (TID) | ORAL | 0 refills | Status: DC

## 2018-08-09 MED ORDER — CALCIUM POLYCARBOPHIL 625 MG PO TABS: 625.0000 mg | ORAL_TABLET | Freq: Three times a day (TID) | ORAL | Status: DC

## 2018-08-09 NOTE — Discharge Instructions (Signed)

## 2018-08-09 NOTE — Progress Notes (Signed)
Physical Therapy Treatment Patient Details Name: Nicole Sellers MRN: [id number] DOB: [birth year] Today's Date: 08/09/2018    History of Present Illness Pt is a 68 y.o. female with colorectal cancer now s/p XI ROBOTIC ASSISTED LOWER ANTERIOR RESECTION; DIAGNOSTIC FLEXIBLE SIGMOIDOSCOPY; INTRAOPERATIVE ASSESSMENT OF VASCULAR PERFUSION with PMHx significant for but not limited to PAD, peripheral neurpathy, DM II, cataracts    PT Comments    Pt c/o arthritis pain in hips with gait, but no discomfort in abdomen.  Discussed use of RW when hips are bothering her more. Recommend HHPT.    Follow Up Recommendations  Home health PT     Equipment Recommendations  None recommended by PT    Recommendations for Other Services       Precautions / Restrictions Precautions Precautions: Fall Precaution Comments: ileostomy Restrictions Weight Bearing Restrictions: No    Mobility  Bed Mobility                  Transfers Overall transfer level: Modified independent               General transfer comment: stood from bed and from toilet  Ambulation/Gait Ambulation/Gait assistance: Min guard Gait Distance (Feet): 350 Feet Assistive device: Straight cane Gait Pattern/deviations: Step-through pattern;Decreased stride length Gait velocity: decr   General Gait Details: Pt amb with cane.  No reaching out with other arm, but does say at home she furniture walks.    Stairs             Wheelchair Mobility    Modified Rankin (Stroke Patients Only)       Balance                                            Cognition Arousal/Alertness: Awake/alert Behavior During Therapy: WFL for tasks assessed/performed Overall Cognitive Status: Within Functional Limits for tasks assessed                                        Exercises      General Comments        Pertinent Vitals/Pain Pain Assessment: Faces Faces Pain Scale: Hurts little  more Pain Location: hips Pain Descriptors / Indicators: Sore Pain Intervention(s): Limited activity within patient's tolerance;Monitored during session    Home Living                      Prior Function            PT Goals (current goals can now be found in the care plan section) Acute Rehab PT Goals Patient Stated Goal: to get stronger PT Goal Formulation: With patient Time For Goal Achievement: 08/18/18 Potential to Achieve Goals: Good Progress towards PT goals: Progressing toward goals    Frequency    Min 3X/week      PT Plan Current plan remains appropriate    Co-evaluation              AM-PAC PT "6 Clicks" Mobility   Outcome Measure  Help needed turning from your back to your side while in a flat bed without using bedrails?: A Little Help needed moving from lying on your back to sitting on the side of a flat bed without using bedrails?: A Little Help needed moving to and  from a bed to a chair (including a wheelchair)?: A Little Help needed standing up from a chair using your arms (e.g., wheelchair or bedside chair)?: A Little Help needed to walk in hospital room?: A Little Help needed climbing 3-5 steps with a railing? : A Little 6 Click Score: 18    End of Session   Activity Tolerance: Patient tolerated treatment well Patient left: in bed;Other (comment)(sitting EOB) Nurse Communication: Mobility status PT Visit Diagnosis: Difficulty in walking, not elsewhere classified (R26.2)     Time: 1610-9604 PT Time Calculation (min) (ACUTE ONLY): 13 min  Charges:  $Gait Training: 8-22 mins                     Nicole Sellers L. Nicole Sellers, [place] Pager [phone] 08/09/2018    Nicole Sellers 08/09/2018, 1:15 PM

## 2018-08-09 NOTE — Progress Notes (Signed)
Discharge instructions given to pt and all questions were answered.  

## 2018-08-09 NOTE — Progress Notes (Signed)
PT Cancellation Note  Patient Details Name: Nicole Sellers MRN: [id number] DOB: 1950-09-25   Cancelled Treatment:    Reason Eval/Treat Not Completed: Other (comment)(nausea). Pt sitting EOB upon arrival. She declined PT due to nausea. Nursing informed.   [name] 08/09/2018, 9:52 AM

## 2018-08-09 NOTE — Discharge Summary (Signed)
Physician Discharge Summary  Patient ID: Nicole Sellers MRN: [id number] DOB/AGE: 70-19-1952 70 y.o.  Admit date: 08/03/2018 Discharge date: 08/09/2018  Admission Diagnoses: rectal cancer  Discharge Diagnoses:  Active Problems:   Rectal cancer [facility])   Discharged Condition: good  Hospital Course: Patient admitted to hospital after low anterior resection.  Her diet was advanced as tolerated.  She did develop high output ileostomy output and was showing signs of dehydration.  She was bolused 2 L of saline and placed back on IV fluids.  Imodium and fiber pills were started.  This controlled her ostomy output to less than 1 L.  Her IV fluids were stopped and she was able to tolerate this and maintain her urine output.  It was felt that she would be stable for discharge to home.  Consults: None  Significant Diagnostic Studies: labs: cbc, bmet  Treatments: IV hydration, analgesia: acetaminophen and surgery: Robotic LAR, diverting ostomy, hernia repair  Discharge Exam: Blood pressure (!) 124/59, pulse 79, temperature 99.3 F (37.4 C), temperature source Oral, resp. rate 17, height 5\' 6"  (1.676 m), weight 70 kg, SpO2 97 %. General appearance: alert and cooperative GI: normal findings: soft, non-tender Incision/Wound: clean, dry, intact  Disposition: Discharge disposition: 01-Home or Self Care        Allergies as of 08/09/2018      Reactions   Kiwi Extract Itching   Throat itching    Lisinopril    Bruising   Other    No blood products   Plavix [clopidogrel Bisulfate]    Upset stomach, bloody stool, bruising       Medication List    TAKE these medications   acetaminophen 500 MG tablet Commonly known as: TYLENOL Take 500 mg by mouth 2 (two) times daily as needed for moderate pain or headache.   albuterol 108 (90 Base) MCG/ACT inhaler Commonly known as: VENTOLIN HFA Inhale 2 puffs into the lungs every 6 (six) hours as needed for wheezing or shortness of breath.    amLODipine 5 MG tablet Commonly known as: NORVASC TAKE 1 TABLET BY MOUTH EVERY DAY   ASPERCREME EX Apply 1 application topically daily as needed (joint pain).   aspirin 81 MG tablet Take 81 mg by mouth daily.   atorvastatin 10 MG tablet Commonly known as: LIPITOR Take 1 tablet (10 mg total) by mouth daily.   CALCIUM 600+D PO Take 1 tablet by mouth daily.   cetirizine 10 MG tablet Commonly known as: ZYRTEC Take 10 mg by mouth daily.   cholecalciferol 1000 units tablet Commonly known as: VITAMIN D Take 4,000 Units by mouth daily.   clopidogrel 75 MG tablet Commonly known as: PLAVIX Take 1 tablet (75 mg total) by mouth daily with breakfast.   diphenoxylate-atropine 2.5-0.025 MG tablet Commonly known as: LOMOTIL Take 1-2 tablets by mouth 4 (four) times daily as needed for diarrhea or loose stools.   Ferrex 150 150 MG capsule Generic drug: iron polysaccharides Take 150 mg by mouth 2 (two) times daily.   gabapentin 300 MG capsule Commonly known as: NEURONTIN Take 300 mg by mouth 2 (two) times daily.   glimepiride 1 MG tablet Commonly known as: AMARYL Take 1 mg by mouth 2 (two) times daily.   levothyroxine 50 MCG tablet Commonly known as: SYNTHROID Take 1 tablet (50 mcg total) by mouth daily.   lidocaine-prilocaine cream Commonly known as: EMLA Apply to affected area once   loperamide 2 MG capsule Commonly known as: IMODIUM Take 2 capsules (4 mg  total) by mouth 3 (three) times daily.   magic mouthwash w/lidocaine Soln Take 5 mLs by mouth 3 (three) times daily. Swish and Spit   metFORMIN 500 MG 24 hr tablet Commonly known as: GLUCOPHAGE-XR TAKE 2 TABLETS BY MOUTH  TWICE A DAY What changed:   when to take this  additional instructions   mirtazapine 7.5 MG tablet Commonly known as: REMERON Take 2 tablets (15 mg total) by mouth at bedtime. What changed:   when to take this  reasons to take this   multivitamin capsule Take 1 capsule by mouth  daily.   NASACORT ALLERGY 24HR NA Place 1 spray into the nose daily as needed (allergies).   ondansetron 8 MG tablet Commonly known as: Zofran Take 1 tablet (8 mg total) by mouth 2 (two) times daily as needed for refractory nausea / vomiting. Start on day 3 after chemotherapy.   Osteo Bi-Flex Adv Triple St Tabs Take 1 tablet by mouth 2 (two) times daily.   prochlorperazine 10 MG tablet Commonly known as: COMPAZINE Take 1 tablet (10 mg total) by mouth every 6 (six) hours as needed (Nausea or vomiting).   tetrahydrozoline-zinc 0.05-0.25 % ophthalmic solution Commonly known as: VISINE-AC Place 1 drop into both eyes 2 (two) times daily as needed (dry eyes).   triamterene-hydrochlorothiazide 75-50 MG tablet Commonly known as: MAXZIDE Take 1 tablet by mouth daily.   vitamin B-12 500 MCG tablet Commonly known as: CYANOCOBALAMIN Take 500 mcg by mouth daily.      Follow-up Information    Leighton Ruff, MD. Schedule an appointment as soon as possible for a visit in 2 week(s).   Specialty: General Surgery Contact information: [address] [place] [address] [phone]           Signed: Rosario Adie 9/68/8648, 7:37 AM

## 2018-08-09 NOTE — TOC Initial Note (Signed)
Transition of Care [facility]) - Initial/Assessment Note    Patient Details  Name: Nicole Sellers MRN: [id number] Date of Birth: 28-Apr-1950  Transition of [facility]) CM/SW Contact:    Nila Nephew, LCSW Phone Number: [phone] 08/09/2018, 12:14 PM  Clinical Narrative:   Patient admitted to hospital after low anterior resection. Pt with rectal CA. CSW consulted to assist with home health needs. Met with pt at bedside. She reports living alone and feeling she is "getting around well" but understanding of recommendation for HHPT/RN and agreeable to referral. Presented her with agency options- seeking provider for Home Health will update note with outcome.                      Patient Goals and CMS Choice    HHPT/RN- making choice    Expected Discharge Plan and Services           Expected Discharge Date: 08/09/18                                    Prior Living Arrangements/Services                       Activities of Daily Living Home Assistive Devices/Equipment: CBG Meter ADL Screening (condition at time of admission) Patient's cognitive ability adequate to safely complete daily activities?: Yes Is the patient deaf or have difficulty hearing?: No Does the patient have difficulty seeing, even when wearing glasses/contacts?: Yes Does the patient have difficulty concentrating, remembering, or making decisions?: No Patient able to express need for assistance with ADLs?: Yes Does the patient have difficulty dressing or bathing?: No Independently performs ADLs?: Yes (appropriate for developmental age) Does the patient have difficulty walking or climbing stairs?: Yes Weakness of Legs: Both Weakness of Arms/Hands: None  Permission Sought/Granted                  Emotional Assessment              Admission diagnosis:  RECTAL CANCER Patient Active Problem List   Diagnosis Date Noted  . Pre-operative cardiovascular examination 06/07/2018  .  Rectal cancer ([place]) 11/06/2017  . Claudication in peripheral vascular disease ([place]) 05/08/2017  . Carotid artery disease ([place]) 04/14/2017  . PAD (peripheral artery disease) Rt ABI 0.7, Lt ABI 0.46 12/25/2012  . S/P arterial stent, 12/25/13, successful diamondback orbital rotational arthrectomy, PTA using chocolate  balloon and stenting using I DEV stent of long segment calcified high-grade proximal and mid r 12/25/2012  . Gangrene of toe, Rt second toe 12/25/2012  . Essential hypertension 12/13/2012  . Non-insulin treated type 2 diabetes mellitus ([place]) 12/13/2012  . Critical lower limb ischemia 12/13/2012  . Tobacco abuse 12/13/2012   PCP:  Reynold Bowen, MD Pharmacy:   [id number] [place]Phone: [phone]: [phone] [place] [facility] [address] [place]Phone: [phone]: [address]Phone: [phone]: [phone]    Social Determinants of Health (SDOH) Interventions    Readmission Risk Interventions No flowsheet data found.

## 2018-08-09 NOTE — Plan of Care (Signed)
Plan of care reviewed and discussed with the patient. 

## 2018-08-10 NOTE — Progress Notes (Signed)
After contacting all home health agencies servicing [place] area, none able to accept pt's referral for HHPT/RN. Explained to pt via phone- she states she will follow up with her PCP at appointment next week.  [name], MSW, LCSW Transitions of Care 08/10/2018 [phone]

## 2018-08-21 ENCOUNTER — Observation Stay (HOSPITAL_COMMUNITY): Payer: Medicare Other

## 2018-08-21 ENCOUNTER — Ambulatory Visit: Payer: Self-pay | Admitting: General Surgery

## 2018-08-21 ENCOUNTER — Other Ambulatory Visit: Payer: Self-pay

## 2018-08-21 ENCOUNTER — Inpatient Hospital Stay (HOSPITAL_COMMUNITY)
Admission: AD | Admit: 2018-08-21 | Discharge: 2018-09-02 | DRG: 389 | Disposition: A | Discharge status: Home Health | Payer: Medicare Other | Source: Ambulatory Visit | Attending: General Surgery | Admitting: General Surgery

## 2018-08-21 DIAGNOSIS — E871 Hypo-osmolality and hyponatremia: Secondary | ICD-10-CM | POA: Diagnosis present

## 2018-08-21 DIAGNOSIS — E860 Dehydration: Secondary | ICD-10-CM

## 2018-08-21 DIAGNOSIS — Z823 Family history of stroke: Secondary | ICD-10-CM

## 2018-08-21 DIAGNOSIS — C20 Malignant neoplasm of rectum: Secondary | ICD-10-CM | POA: Diagnosis present

## 2018-08-21 DIAGNOSIS — Z79899 Other long term (current) drug therapy: Secondary | ICD-10-CM

## 2018-08-21 DIAGNOSIS — Z7982 Long term (current) use of aspirin: Secondary | ICD-10-CM

## 2018-08-21 DIAGNOSIS — Z833 Family history of diabetes mellitus: Secondary | ICD-10-CM

## 2018-08-21 DIAGNOSIS — Z87891 Personal history of nicotine dependence: Secondary | ICD-10-CM

## 2018-08-21 DIAGNOSIS — T451X5A Adverse effect of antineoplastic and immunosuppressive drugs, initial encounter: Secondary | ICD-10-CM | POA: Diagnosis present

## 2018-08-21 DIAGNOSIS — D6481 Anemia due to antineoplastic chemotherapy: Secondary | ICD-10-CM | POA: Diagnosis present

## 2018-08-21 DIAGNOSIS — Z7984 Long term (current) use of oral hypoglycemic drugs: Secondary | ICD-10-CM

## 2018-08-21 DIAGNOSIS — Y838 Other surgical procedures as the cause of abnormal reaction of the patient, or of later complication, without mention of misadventure at the time of the procedure: Secondary | ICD-10-CM | POA: Diagnosis present

## 2018-08-21 DIAGNOSIS — Z95820 Peripheral vascular angioplasty status with implants and grafts: Secondary | ICD-10-CM

## 2018-08-21 DIAGNOSIS — Z818 Family history of other mental and behavioral disorders: Secondary | ICD-10-CM

## 2018-08-21 DIAGNOSIS — Z8249 Family history of ischemic heart disease and other diseases of the circulatory system: Secondary | ICD-10-CM

## 2018-08-21 DIAGNOSIS — E1151 Type 2 diabetes mellitus with diabetic peripheral angiopathy without gangrene: Secondary | ICD-10-CM | POA: Diagnosis present

## 2018-08-21 DIAGNOSIS — K913 Postprocedural intestinal obstruction, unspecified as to partial versus complete: Principal | ICD-10-CM | POA: Diagnosis present

## 2018-08-21 DIAGNOSIS — Z0189 Encounter for other specified special examinations: Secondary | ICD-10-CM

## 2018-08-21 DIAGNOSIS — Z79890 Hormone replacement therapy (postmenopausal): Secondary | ICD-10-CM

## 2018-08-21 DIAGNOSIS — Z20828 Contact with and (suspected) exposure to other viral communicable diseases: Secondary | ICD-10-CM | POA: Diagnosis present

## 2018-08-21 DIAGNOSIS — Z7902 Long term (current) use of antithrombotics/antiplatelets: Secondary | ICD-10-CM

## 2018-08-21 DIAGNOSIS — K56609 Unspecified intestinal obstruction, unspecified as to partial versus complete obstruction: Secondary | ICD-10-CM

## 2018-08-21 DIAGNOSIS — R110 Nausea: Secondary | ICD-10-CM

## 2018-08-21 DIAGNOSIS — R627 Adult failure to thrive: Secondary | ICD-10-CM | POA: Diagnosis present

## 2018-08-21 LAB — GLUCOSE, CAPILLARY: Glucose-Capillary: 168 mg/dL — ABNORMAL HIGH (ref 70–99)

## 2018-08-21 MED ORDER — ONDANSETRON 4 MG PO TBDP: 4.0000 mg | ORAL_TABLET | Freq: Four times a day (QID) | ORAL | Status: DC | PRN

## 2018-08-21 MED ORDER — CLOPIDOGREL BISULFATE 75 MG PO TABS: 75.0000 mg | ORAL_TABLET | Freq: Every day | ORAL | Status: DC

## 2018-08-21 MED ORDER — AMLODIPINE BESYLATE 5 MG PO TABS
5.0000 mg | ORAL_TABLET | Freq: Every day | ORAL | Status: DC
  Administered 2018-08-22 – 2018-09-02 (×12): 5 mg via ORAL
  Filled 2018-08-21 (×11): qty 1

## 2018-08-21 MED ORDER — SODIUM CHLORIDE 0.9 % IV SOLN: INTRAVENOUS | Status: AC

## 2018-08-21 MED ORDER — ASPIRIN 81 MG PO CHEW
81.0000 mg | CHEWABLE_TABLET | Freq: Every day | ORAL | Status: DC
  Administered 2018-08-22 – 2018-09-02 (×12): 81 mg via ORAL
  Filled 2018-08-21 (×11): qty 1

## 2018-08-21 MED ORDER — DIPHENHYDRAMINE HCL 50 MG/ML IJ SOLN: 12.5000 mg | Freq: Four times a day (QID) | INTRAMUSCULAR | Status: AC | PRN

## 2018-08-21 MED ORDER — ONDANSETRON HCL 4 MG/2ML IJ SOLN: 4.0000 mg | Freq: Four times a day (QID) | INTRAMUSCULAR | Status: DC | PRN

## 2018-08-21 MED ORDER — TRIAMTERENE-HCTZ 75-50 MG PO TABS
1.0000 | ORAL_TABLET | Freq: Every day | ORAL | Status: DC
  Administered 2018-08-22 – 2018-09-02 (×12): 1 via ORAL
  Filled 2018-08-21 (×12): qty 1

## 2018-08-21 MED ORDER — ALBUTEROL SULFATE (2.5 MG/3ML) 0.083% IN NEBU: 2.5000 mg | INHALATION_SOLUTION | Freq: Four times a day (QID) | RESPIRATORY_TRACT | Status: DC | PRN

## 2018-08-21 MED ORDER — LEVOTHYROXINE SODIUM 50 MCG PO TABS
50.0000 ug | ORAL_TABLET | Freq: Every day | ORAL | Status: DC
  Administered 2018-08-22 – 2018-09-02 (×5): 50 ug via ORAL
  Filled 2018-08-21 (×7): qty 1

## 2018-08-21 MED ORDER — ACETAMINOPHEN 650 MG RE SUPP: 650.0000 mg | Freq: Four times a day (QID) | RECTAL | Status: AC | PRN

## 2018-08-21 MED ORDER — GABAPENTIN 300 MG PO CAPS
300.0000 mg | ORAL_CAPSULE | Freq: Two times a day (BID) | ORAL | Status: DC
  Administered 2018-08-21 – 2018-09-02 (×23): 300 mg via ORAL
  Filled 2018-08-21 (×22): qty 1

## 2018-08-21 MED ORDER — CALCIUM POLYCARBOPHIL 625 MG PO TABS
625.0000 mg | ORAL_TABLET | Freq: Three times a day (TID) | ORAL | Status: DC
  Administered 2018-08-21: 625 mg via ORAL
  Filled 2018-08-21 (×2): qty 1

## 2018-08-21 MED ORDER — METFORMIN HCL ER 500 MG PO TB24
1000.0000 mg | ORAL_TABLET | Freq: Every day | ORAL | Status: DC
  Administered 2018-08-22 – 2018-09-02 (×12): 1000 mg via ORAL
  Filled 2018-08-21 (×11): qty 2

## 2018-08-21 MED ORDER — ONDANSETRON 4 MG PO TBDP: 4.0000 mg | ORAL_TABLET | Freq: Four times a day (QID) | ORAL | Status: AC | PRN

## 2018-08-21 MED ORDER — KCL IN DEXTROSE-NACL 20-5-0.45 MEQ/L-%-% IV SOLN
INTRAVENOUS | Status: DC
  Administered 2018-08-22: 03:00:00 via INTRAVENOUS
  Filled 2018-08-21: qty 1000

## 2018-08-21 MED ORDER — ACETAMINOPHEN 325 MG PO TABS: 650.0000 mg | ORAL_TABLET | Freq: Four times a day (QID) | ORAL | Status: AC | PRN

## 2018-08-21 MED ORDER — DIPHENHYDRAMINE HCL 12.5 MG/5ML PO ELIX: 12.5000 mg | ORAL_SOLUTION | Freq: Four times a day (QID) | ORAL | Status: AC | PRN

## 2018-08-21 MED ORDER — GLIMEPIRIDE 1 MG PO TABS
1.0000 mg | ORAL_TABLET | Freq: Two times a day (BID) | ORAL | Status: DC
  Administered 2018-08-22 – 2018-09-02 (×21): 1 mg via ORAL
  Filled 2018-08-21 (×24): qty 1

## 2018-08-21 MED ORDER — SODIUM CHLORIDE 0.9 % IV BOLUS: 1000.0000 mL | Freq: Once | INTRAVENOUS | Status: AC

## 2018-08-21 MED ORDER — ACETAMINOPHEN 650 MG RE SUPP: 650.0000 mg | Freq: Four times a day (QID) | RECTAL | Status: DC | PRN

## 2018-08-21 MED ORDER — INSULIN ASPART 100 UNIT/ML ~~LOC~~ SOLN
0.0000 [IU] | Freq: Three times a day (TID) | SUBCUTANEOUS | Status: DC
  Administered 2018-08-22: 7 [IU] via SUBCUTANEOUS
  Administered 2018-08-22: 4 [IU] via SUBCUTANEOUS

## 2018-08-21 MED ORDER — MIRTAZAPINE 15 MG PO TABS: 15.0000 mg | ORAL_TABLET | Freq: Every evening | ORAL | Status: DC | PRN

## 2018-08-21 MED ORDER — ACETAMINOPHEN 325 MG PO TABS
650.0000 mg | ORAL_TABLET | Freq: Four times a day (QID) | ORAL | Status: DC | PRN
  Administered 2018-09-02: 650 mg via ORAL
  Filled 2018-08-21: qty 2

## 2018-08-21 MED ORDER — ENOXAPARIN SODIUM 40 MG/0.4ML ~~LOC~~ SOLN
40.0000 mg | SUBCUTANEOUS | Status: DC
  Administered 2018-08-21 – 2018-09-01 (×12): 40 mg via SUBCUTANEOUS
  Filled 2018-08-21 (×12): qty 0.4

## 2018-08-21 MED ORDER — SODIUM CHLORIDE 0.9% FLUSH
10.0000 mL | INTRAVENOUS | Status: DC | PRN
  Administered 2018-09-02: 10 mL
  Filled 2018-08-21: qty 40

## 2018-08-21 MED ORDER — ENOXAPARIN SODIUM 150 MG/ML ~~LOC~~ SOLN: 30.0000 mg | SUBCUTANEOUS | Status: DC

## 2018-08-21 MED ORDER — TRAMADOL HCL 50 MG PO TABS
50.0000 mg | ORAL_TABLET | Freq: Four times a day (QID) | ORAL | Status: DC | PRN
  Administered 2018-08-21 – 2018-08-26 (×4): 50 mg via ORAL
  Filled 2018-08-21 (×4): qty 1

## 2018-08-21 MED ORDER — DIPHENHYDRAMINE HCL 12.5 MG/5ML PO ELIX
12.5000 mg | ORAL_SOLUTION | Freq: Four times a day (QID) | ORAL | Status: DC | PRN
  Administered 2018-08-31: 12.5 mg via ORAL
  Filled 2018-08-21: qty 5

## 2018-08-21 MED ORDER — DIPHENHYDRAMINE HCL 50 MG/ML IJ SOLN
12.5000 mg | Freq: Four times a day (QID) | INTRAMUSCULAR | Status: DC | PRN
  Administered 2018-08-26 – 2018-08-28 (×3): 12.5 mg via INTRAVENOUS
  Filled 2018-08-21 (×3): qty 1

## 2018-08-21 MED ORDER — CALCIUM POLYCARBOPHIL 625 MG PO TABS: 625.0000 mg | ORAL_TABLET | Freq: Three times a day (TID) | ORAL | Status: AC

## 2018-08-21 MED ORDER — SODIUM CHLORIDE 0.9 % IV BOLUS
1000.0000 mL | Freq: Once | INTRAVENOUS | Status: AC
  Administered 2018-08-21: 1000 mL via INTRAVENOUS

## 2018-08-21 MED ORDER — INSULIN ASPART 100 UNIT/ML ~~LOC~~ SOLN: 0.0000 [IU] | Freq: Every day | SUBCUTANEOUS | Status: DC

## 2018-08-21 MED ORDER — ATORVASTATIN CALCIUM 10 MG PO TABS
10.0000 mg | ORAL_TABLET | Freq: Every day | ORAL | Status: DC
  Administered 2018-08-23 – 2018-09-01 (×9): 10 mg via ORAL
  Filled 2018-08-21 (×8): qty 1

## 2018-08-21 MED ORDER — SODIUM CHLORIDE 0.9 % IV SOLN: 4.0000 mg | Freq: Four times a day (QID) | INTRAVENOUS | Status: AC | PRN

## 2018-08-21 NOTE — Progress Notes (Signed)
Call made to Dr Redmond Pulling to let him know Leann and I looked for orders under signed and held; we found no orders even after logging out of computer and looking again. He will call Dr Marcello Moores. Donne Hazel, RN

## 2018-08-21 NOTE — Progress Notes (Signed)
Dr Redmond Pulling paged to let him know patient is on unit and has no signed and held orders. Dr Redmond Pulling stated to wait a few minutes, as Dr Marcello Moores just texted him and said orders are in chart under signed and held. Will close and reopen chart.  Donne Hazel, RN

## 2018-08-21 NOTE — H&P (Signed)
Nicole Sellers is an 68 y.o. female.   Chief Complaint: dehydration, fatigue HPI: 68 y.o. F s/p diverting ileostomy with worsening dehydration and fatigue at home.  Outside labs show worsening renal function despite increased PO intake.    Past Medical History:  Diagnosis Date  . Age-related nuclear cataract, right eye   . Allergy   . Anemia   . Arthritis    "joints sometimes" (12/25/2012)  . Clotting disorder ([place])   . Colon cancer ([place])   . Critical lower limb ischemia   . Diverticulosis   . Gangrene of toe, Rt second toe 12/25/2012  . GERD (gastroesophageal reflux disease)   . High cholesterol   . Hypertension   . Hypothyroidism   . PAD (peripheral artery disease) ([place])   . PAD (peripheral artery disease) ([place])   . Peripheral neuropathy   . Rectal cancer ([place])   . S/P arterial stent, 12/25/13, successful diamondback orbital rotational arthrectomy, PTA using chocolate  balloon and stenting using I DEV stent of long segment calcified high-grade proximal and mid r 12/25/2012  . Tobacco abuse   . Type II diabetes mellitus ([place])     Past Surgical History:  Procedure Laterality Date  . ABDOMINAL AORTAGRAM  12/20/2012   Procedure: ABDOMINAL AORTAGRAM;  Surgeon: Lorretta Harp, MD;  Location: [facility] CATH LAB;  Service: Cardiovascular;;  . ABDOMINAL HYSTERECTOMY  2000  . ANGIOPLASTY / STENTING FEMORAL Right 12/25/2012  . ATHERECTOMY Right 12/25/2012   Procedure: ATHERECTOMY;  Surgeon: Lorretta Harp, MD;  Location: [facility] CATH LAB;  Service: Cardiovascular;  Laterality: Right;  right SFA  . COLONOSCOPY    . DIVERTING ILEOSTOMY N/A 08/03/2018   Procedure: DIVERTING LOOP ILEOSTOMY;  Surgeon: Leighton Ruff, MD;  [facility]: [facility] ORS;  Service: General;  Laterality: N/A;  . EYE SURGERY Left    cataract  . HERNIA REPAIR    . LOWER EXTREMITY ANGIOGRAM N/A 12/20/2012   Procedure: LOWER EXTREMITY ANGIOGRAM;  Surgeon: Lorretta Harp, MD;  Location: [facility] CATH LAB;  Service: Cardiovascular;   Laterality: N/A;  . LOWER EXTREMITY ANGIOGRAM N/A 12/25/2012   Procedure: LOWER EXTREMITY ANGIOGRAM;  Surgeon: Lorretta Harp, MD;  Location: [facility] CATH LAB;  Service: Cardiovascular;  Laterality: N/A;  . LOWER EXTREMITY INTERVENTION  05/08/2017  . LOWER EXTREMITY INTERVENTION Bilateral 05/08/2017   Procedure: LOWER EXTREMITY INTERVENTION;  Surgeon: Lorretta Harp, MD;  Location: [place] CV LAB;  Service: Cardiovascular;  Laterality: Bilateral;  . PERIPHERAL VASCULAR BALLOON ANGIOPLASTY Right 05/08/2017   Procedure: PERIPHERAL VASCULAR BALLOON ANGIOPLASTY;  Surgeon: Lorretta Harp, MD;  Location: [place] CV LAB;  Service: Cardiovascular;  Laterality: Right;  sfa  . PERIPHERAL VASCULAR INTERVENTION Right 05/08/2017   Procedure: PERIPHERAL VASCULAR INTERVENTION;  Surgeon: Lorretta Harp, MD;  Location: [place] CV LAB;  Service: Cardiovascular;  Laterality: Right;  ext iliac  . PORTACATH PLACEMENT Right 11/22/2017   Procedure: INSERTION PORT-A-CATH;  Surgeon: Leighton Ruff, MD;  [facility];  Service: General;  Laterality: Right;  . TOE SURGERY Right    2n toe   . UMBILICAL HERNIA REPAIR  2000  . UMBILICAL HERNIA REPAIR N/A 08/03/2018   Procedure: UMBILICAL HERNIA REPAIR;  Surgeon: Leighton Ruff, MD;  [facility];  Service: General;  Laterality: N/A;  . XI ROBOTIC ASSISTED LOWER ANTERIOR RESECTION N/A 08/03/2018   Procedure: XI ROBOTIC ASSISTED LOWER ANTERIOR RESECTION; DIAGNOSTIC FLEXIBLE SIGMOIDOSCOPY; INTRAOPERATIVE ASSESSMENT OF VASCULAR PERFUSION;  Surgeon: Leighton Ruff, MD;  [facility];  Service:  General;  Laterality: N/A;    Family History  Problem Relation Age of Onset  . Hypertension Mother   . Stroke Mother   . Cancer Father        prostate  . Mental illness Sister   . Diabetes Brother   . Hypertension Brother   . Cancer Brother 69       prostate  . Diabetes Brother   . Hypertension Brother   . Cancer Brother        "tumors on neck and  body"    Social History:  reports that she has quit smoking. Her smoking use included cigarettes. She has a 24.00 pack-year smoking history. She has never used smokeless tobacco. She reports that she does not drink alcohol or use drugs.  Allergies:  Allergies  Allergen Reactions  . Kiwi Extract Itching    Throat itching   . Lisinopril     Bruising   . Other     No blood products  . Plavix [Clopidogrel Bisulfate]     Upset stomach, bloody stool, bruising     Medications Prior to Admission  Medication Sig Dispense Refill  . acetaminophen (TYLENOL) 500 MG tablet Take 500 mg by mouth 2 (two) times daily as needed for moderate pain or headache.     . albuterol (PROVENTIL HFA;VENTOLIN HFA) 108 (90 BASE) MCG/ACT inhaler Inhale 2 puffs into the lungs every 6 (six) hours as needed for wheezing or shortness of breath.    Marland Kitchen amLODipine (NORVASC) 5 MG tablet TAKE 1 TABLET BY MOUTH EVERY DAY (Patient taking differently: Take 5 mg by mouth daily. ) 90 tablet 2  . aspirin 81 MG tablet Take 81 mg by mouth daily.    Marland Kitchen atorvastatin (LIPITOR) 10 MG tablet Take 1 tablet (10 mg total) by mouth daily. 90 tablet 1  . Calcium Carbonate-Vitamin D (CALCIUM 600+D PO) Take 1 tablet by mouth daily.    . cetirizine (ZYRTEC) 10 MG tablet Take 10 mg by mouth daily.    . cholecalciferol (VITAMIN D) 1000 units tablet Take 4,000 Units by mouth daily.     . diphenoxylate-atropine (LOMOTIL) 2.5-0.025 MG tablet Take 1-2 tablets by mouth 4 (four) times daily as needed for diarrhea or loose stools. 40 tablet 0  . FERREX 150 150 MG capsule Take 150 mg by mouth 2 (two) times daily.  11  . gabapentin (NEURONTIN) 300 MG capsule Take 300 mg by mouth 2 (two) times daily.     Marland Kitchen glimepiride (AMARYL) 1 MG tablet Take 1 mg by mouth 2 (two) times daily.    Marland Kitchen levothyroxine (SYNTHROID, LEVOTHROID) 50 MCG tablet Take 1 tablet (50 mcg total) by mouth daily. 90 tablet 1  . lidocaine-prilocaine (EMLA) cream Apply to affected area once 30 g  3  . loperamide (IMODIUM) 2 MG capsule Take 2 capsules (4 mg total) by mouth 3 (three) times daily. 30 capsule 0  . metFORMIN (GLUCOPHAGE-XR) 500 MG 24 hr tablet TAKE 2 TABLETS BY MOUTH  TWICE A DAY (Patient taking differently: Take 1,000 mg by mouth 2 (two) times a day. Give w/food.) 60 tablet 0  . mirtazapine (REMERON) 7.5 MG tablet Take 2 tablets (15 mg total) by mouth at bedtime. (Patient taking differently: Take 15 mg by mouth at bedtime as needed (sleep). ) 60 tablet 2  . Misc Natural Products (OSTEO BI-FLEX ADV TRIPLE ST) TABS Take 1 tablet by mouth 2 (two) times daily.    . Multiple Vitamin (MULTIVITAMIN) capsule Take 1 capsule  by mouth daily.    . ondansetron (ZOFRAN) 8 MG tablet Take 1 tablet (8 mg total) by mouth 2 (two) times daily as needed for refractory nausea / vomiting. Start on day 3 after chemotherapy. 30 tablet 1  . prochlorperazine (COMPAZINE) 10 MG tablet Take 1 tablet (10 mg total) by mouth every 6 (six) hours as needed (Nausea or vomiting). 30 tablet 1  . tetrahydrozoline-zinc (VISINE-AC) 0.05-0.25 % ophthalmic solution Place 1 drop into both eyes 2 (two) times daily as needed (dry eyes).    . Triamcinolone Acetonide (NASACORT ALLERGY 24HR NA) Place 1 spray into the nose daily as needed (allergies).    . triamterene-hydrochlorothiazide (MAXZIDE) 75-50 MG tablet Take 1 tablet by mouth daily.    Loura Pardon Salicylate (ASPERCREME EX) Apply 1 application topically daily as needed (joint pain).    . vitamin B-12 (CYANOCOBALAMIN) 500 MCG tablet Take 500 mcg by mouth daily.    . clopidogrel (PLAVIX) 75 MG tablet Take 1 tablet (75 mg total) by mouth daily with breakfast. (Patient not taking: Reported on 08/01/2018) 90 tablet 3  . magic mouthwash w/lidocaine SOLN Take 5 mLs by mouth 3 (three) times daily. Swish and Spit (Patient not taking: Reported on 08/01/2018) 240 mL 0  . polycarbophil (FIBERCON) 625 MG tablet Take 1 tablet (625 mg total) by mouth 3 (three) times daily.      No  results found for this or any previous visit (from the past 48 hour(s)). No results found.  Review of Systems  Constitutional: Positive for malaise/fatigue. Negative for chills and fever.  HENT: Negative for hearing loss and tinnitus.   Eyes: Negative for blurred vision.  Respiratory: Negative for cough and shortness of breath.   Cardiovascular: Negative for chest pain and palpitations.  Gastrointestinal: Positive for nausea. Negative for abdominal pain and vomiting.  Genitourinary: Negative for dysuria, frequency and urgency.    Blood pressure 123/73, pulse 90, temperature 98.7 F (37.1 C), temperature source Oral, resp. rate 18, SpO2 100 %. Physical Exam  Constitutional: She appears well-developed and well-nourished.  HENT:  Head: Normocephalic and atraumatic.  Eyes: Pupils are equal, round, and reactive to light. Conjunctivae are normal.  Neck: Normal range of motion. Neck supple.  Cardiovascular: Regular rhythm.  Respiratory: Effort normal and breath sounds normal.  GI: Soft. She exhibits distension. There is no abdominal tenderness. There is no rebound.  Musculoskeletal: Normal range of motion.  Neurological: She is alert.     Assessment/Plan 68 y.o. F s/p colorectal surgery and diverting ileostomy.  Pt is having trouble keeping up with her fluids at home and continues to be dehydrated.  Since she does not have home health, she needs to be admitted to the hospital for rehydration.  Attempted outpatient IV clinic but pt was refused access.   Will get abd X ray to eval for signs of ileus or obstruction.  Recheck labs in AM  Rosario Adie, MD 6/38/1771, 7:16 PM

## 2018-08-21 NOTE — H&P (Signed)
Nicole Sellers is an 68 y.o. female.   Chief Complaint: dehydration, fatigue HPI: 68 y.o. F s/p diverting ileostomy with worsening dehydration and fatigue at home.  Outside labs show worsening renal function despite increased PO intake.    Past Medical History:  Diagnosis Date  . Age-related nuclear cataract, right eye   . Allergy   . Anemia   . Arthritis    "joints sometimes" (12/25/2012)  . Clotting disorder ([place])   . Colon cancer ([place])   . Critical lower limb ischemia   . Diverticulosis   . Gangrene of toe, Rt second toe 12/25/2012  . GERD (gastroesophageal reflux disease)   . High cholesterol   . Hypertension   . Hypothyroidism   . PAD (peripheral artery disease) ([place])   . PAD (peripheral artery disease) ([place])   . Peripheral neuropathy   . Rectal cancer ([place])   . S/P arterial stent, 12/25/13, successful diamondback orbital rotational arthrectomy, PTA using chocolate  balloon and stenting using I DEV stent of long segment calcified high-grade proximal and mid r 12/25/2012  . Tobacco abuse   . Type II diabetes mellitus ([place])     Past Surgical History:  Procedure Laterality Date  . ABDOMINAL AORTAGRAM  12/20/2012   Procedure: ABDOMINAL AORTAGRAM;  Surgeon: Lorretta Harp, MD;  Location: [facility] CATH LAB;  Service: Cardiovascular;;  . ABDOMINAL HYSTERECTOMY  2000  . ANGIOPLASTY / STENTING FEMORAL Right 12/25/2012  . ATHERECTOMY Right 12/25/2012   Procedure: ATHERECTOMY;  Surgeon: Lorretta Harp, MD;  Location: [facility] CATH LAB;  Service: Cardiovascular;  Laterality: Right;  right SFA  . COLONOSCOPY    . DIVERTING ILEOSTOMY N/A 08/03/2018   Procedure: DIVERTING LOOP ILEOSTOMY;  Surgeon: Leighton Ruff, MD;  [facility];  Service: General;  Laterality: N/A;  . EYE SURGERY Left    cataract  . HERNIA REPAIR    . LOWER EXTREMITY ANGIOGRAM N/A 12/20/2012   Procedure: LOWER EXTREMITY ANGIOGRAM;  Surgeon: Lorretta Harp, MD;  Location[facility] CATH LAB;  Service: Cardiovascular;   Laterality: N/A;  . LOWER EXTREMITY ANGIOGRAM N/A 12/25/2012   Procedure: LOWER EXTREMITY ANGIOGRAM;  Surgeon: Lorretta Harp, MD;  Location: [facility] CATH LAB;  Service: Cardiovascular;  Laterality: N/A;  . LOWER EXTREMITY INTERVENTION  05/08/2017  . LOWER EXTREMITY INTERVENTION Bilateral 05/08/2017   Procedure: LOWER EXTREMITY INTERVENTION;  Surgeon: Lorretta Harp, MD;  Location: [place] CV LAB;  Service: Cardiovascular;  Laterality: Bilateral;  . PERIPHERAL VASCULAR BALLOON ANGIOPLASTY Right 05/08/2017   Procedure: PERIPHERAL VASCULAR BALLOON ANGIOPLASTY;  Surgeon: Lorretta Harp, MD;  Location: [place] CV LAB;  Service: Cardiovascular;  Laterality: Right;  sfa  . PERIPHERAL VASCULAR INTERVENTION Right 05/08/2017   Procedure: PERIPHERAL VASCULAR INTERVENTION;  Surgeon: Lorretta Harp, MD;  Location: [place] CV LAB;  Service: Cardiovascular;  Laterality: Right;  ext iliac  . PORTACATH PLACEMENT Right 11/22/2017   Procedure: INSERTION PORT-A-CATH;  Surgeon: Leighton Ruff, MD;  [facility];  Service: General;  Laterality: Right;  . TOE SURGERY Right    2n toe   . UMBILICAL HERNIA REPAIR  2000  . UMBILICAL HERNIA REPAIR N/A 08/03/2018   Procedure: UMBILICAL HERNIA REPAIR;  Surgeon: Leighton Ruff, MD;  [facility];  Service: General;  Laterality: N/A;  . XI ROBOTIC ASSISTED LOWER ANTERIOR RESECTION N/A 08/03/2018   Procedure: XI ROBOTIC ASSISTED LOWER ANTERIOR RESECTION; DIAGNOSTIC FLEXIBLE SIGMOIDOSCOPY; INTRAOPERATIVE ASSESSMENT OF VASCULAR PERFUSION;  Surgeon: Leighton Ruff, MD;  [facility];  Service:  General;  Laterality: N/A;    Family History  Problem Relation Age of Onset  . Hypertension Mother   . Stroke Mother   . Cancer Father        prostate  . Mental illness Sister   . Diabetes Brother   . Hypertension Brother   . Cancer Brother 53       prostate  . Diabetes Brother   . Hypertension Brother   . Cancer Brother        "tumors on neck and  body"    Social History:  reports that she has quit smoking. Her smoking use included cigarettes. She has a 24.00 pack-year smoking history. She has never used smokeless tobacco. She reports that she does not drink alcohol or use drugs.  Allergies:  Allergies  Allergen Reactions  . Kiwi Extract Itching    Throat itching   . Lisinopril     Bruising   . Other     No blood products  . Plavix [Clopidogrel Bisulfate]     Upset stomach, bloody stool, bruising     (Not in a hospital admission)   No results found for this or any previous visit (from the past 48 hour(s)). No results found.  Review of Systems  Constitutional: Positive for malaise/fatigue. Negative for chills and fever.  HENT: Negative for hearing loss and tinnitus.   Eyes: Negative for blurred vision.  Respiratory: Negative for cough and shortness of breath.   Cardiovascular: Negative for chest pain and palpitations.  Gastrointestinal: Positive for nausea. Negative for abdominal pain and vomiting.  Genitourinary: Negative for dysuria, frequency and urgency.    There were no vitals taken for this visit. Physical Exam  Constitutional: She appears well-developed and well-nourished.  HENT:  Head: Normocephalic and atraumatic.  Eyes: Pupils are equal, round, and reactive to light. Conjunctivae are normal.  Neck: Normal range of motion. Neck supple.  Cardiovascular: Regular rhythm.  Respiratory: Effort normal and breath sounds normal.  GI: Soft. She exhibits distension. There is no abdominal tenderness. There is no rebound.  Musculoskeletal: Normal range of motion.  Neurological: She is alert.     Assessment/Plan 68 y.o. F s/p colorectal surgery and diverting ileostomy.  Pt is having trouble keeping up with her fluids at home and continues to be dehydrated.  Since she does not have home health, she needs to be admitted to the hospital for rehydration.  Attempted outpatient IV clinic but pt was refused access.   Will  get abd X ray to eval for signs of ileus or obstruction.  Recheck labs in AM  Rosario Adie, MD 1/50/5697, 1:52 PM

## 2018-08-22 DIAGNOSIS — Z8249 Family history of ischemic heart disease and other diseases of the circulatory system: Secondary | ICD-10-CM | POA: Diagnosis not present

## 2018-08-22 DIAGNOSIS — K56609 Unspecified intestinal obstruction, unspecified as to partial versus complete obstruction: Secondary | ICD-10-CM | POA: Diagnosis not present

## 2018-08-22 DIAGNOSIS — K913 Postprocedural intestinal obstruction, unspecified as to partial versus complete: Secondary | ICD-10-CM | POA: Diagnosis present

## 2018-08-22 DIAGNOSIS — Z79899 Other long term (current) drug therapy: Secondary | ICD-10-CM | POA: Diagnosis not present

## 2018-08-22 DIAGNOSIS — E871 Hypo-osmolality and hyponatremia: Secondary | ICD-10-CM | POA: Diagnosis present

## 2018-08-22 DIAGNOSIS — Z87891 Personal history of nicotine dependence: Secondary | ICD-10-CM | POA: Diagnosis not present

## 2018-08-22 DIAGNOSIS — Z7982 Long term (current) use of aspirin: Secondary | ICD-10-CM | POA: Diagnosis not present

## 2018-08-22 DIAGNOSIS — Z95820 Peripheral vascular angioplasty status with implants and grafts: Secondary | ICD-10-CM | POA: Diagnosis not present

## 2018-08-22 DIAGNOSIS — Z7984 Long term (current) use of oral hypoglycemic drugs: Secondary | ICD-10-CM | POA: Diagnosis not present

## 2018-08-22 DIAGNOSIS — Z833 Family history of diabetes mellitus: Secondary | ICD-10-CM | POA: Diagnosis not present

## 2018-08-22 DIAGNOSIS — C20 Malignant neoplasm of rectum: Secondary | ICD-10-CM | POA: Diagnosis present

## 2018-08-22 DIAGNOSIS — R627 Adult failure to thrive: Secondary | ICD-10-CM | POA: Diagnosis present

## 2018-08-22 DIAGNOSIS — Z7902 Long term (current) use of antithrombotics/antiplatelets: Secondary | ICD-10-CM | POA: Diagnosis not present

## 2018-08-22 DIAGNOSIS — Z20828 Contact with and (suspected) exposure to other viral communicable diseases: Secondary | ICD-10-CM | POA: Diagnosis present

## 2018-08-22 DIAGNOSIS — Z818 Family history of other mental and behavioral disorders: Secondary | ICD-10-CM | POA: Diagnosis not present

## 2018-08-22 DIAGNOSIS — Z79890 Hormone replacement therapy (postmenopausal): Secondary | ICD-10-CM | POA: Diagnosis not present

## 2018-08-22 DIAGNOSIS — Y838 Other surgical procedures as the cause of abnormal reaction of the patient, or of later complication, without mention of misadventure at the time of the procedure: Secondary | ICD-10-CM | POA: Diagnosis present

## 2018-08-22 DIAGNOSIS — T451X5A Adverse effect of antineoplastic and immunosuppressive drugs, initial encounter: Secondary | ICD-10-CM | POA: Diagnosis present

## 2018-08-22 DIAGNOSIS — E1151 Type 2 diabetes mellitus with diabetic peripheral angiopathy without gangrene: Secondary | ICD-10-CM | POA: Diagnosis present

## 2018-08-22 DIAGNOSIS — D6481 Anemia due to antineoplastic chemotherapy: Secondary | ICD-10-CM | POA: Diagnosis present

## 2018-08-22 DIAGNOSIS — E860 Dehydration: Secondary | ICD-10-CM | POA: Diagnosis present

## 2018-08-22 DIAGNOSIS — Z823 Family history of stroke: Secondary | ICD-10-CM | POA: Diagnosis not present

## 2018-08-22 LAB — BASIC METABOLIC PANEL
Anion gap: 13 (ref 5–15)
Anion gap: 7 (ref 5–15)
BUN: 74 mg/dL — ABNORMAL HIGH (ref 8–23)
BUN: 55 mg/dL — ABNORMAL HIGH (ref 8–23)
CO2: 26 mmol/L (ref 22–32)
CO2: 26 mmol/L (ref 22–32)
Calcium: 8.7 mg/dL — ABNORMAL LOW (ref 8.9–10.3)
Calcium: 8.8 mg/dL — ABNORMAL LOW (ref 8.9–10.3)
Chloride: 88 mmol/L — ABNORMAL LOW (ref 98–111)
Chloride: 98 mmol/L (ref 98–111)
Creatinine, Ser: 1.59 mg/dL — ABNORMAL HIGH (ref 0.44–1.00)
Creatinine, Ser: 1.13 mg/dL — ABNORMAL HIGH (ref 0.44–1.00)
GFR calc Af Amer: 38 mL/min — ABNORMAL LOW (ref >60)
GFR calc Af Amer: 57 mL/min — ABNORMAL LOW (ref >60)
GFR calc non Af Amer: 33 mL/min — ABNORMAL LOW (ref >60)
GFR calc non Af Amer: 49 mL/min — ABNORMAL LOW (ref >60)
Glucose, Bld: 182 mg/dL — ABNORMAL HIGH (ref 70–99)
Glucose, Bld: 102 mg/dL — ABNORMAL HIGH (ref 70–99)
Potassium: 4.3 mmol/L (ref 3.5–5.1)
Potassium: 4.3 mmol/L (ref 3.5–5.1)
Sodium: 127 mmol/L — ABNORMAL LOW (ref 135–145)
Sodium: 131 mmol/L — ABNORMAL LOW (ref 135–145)

## 2018-08-22 LAB — SARS CORONAVIRUS 2 BY RT PCR (HOSPITAL ORDER, PERFORMED IN ~~LOC~~ HOSPITAL LAB): SARS Coronavirus 2: NEGATIVE

## 2018-08-22 LAB — GLUCOSE, CAPILLARY
Glucose-Capillary: 215 mg/dL — ABNORMAL HIGH (ref 70–99)
Glucose-Capillary: 167 mg/dL — ABNORMAL HIGH (ref 70–99)
Glucose-Capillary: 98 mg/dL (ref 70–99)
Glucose-Capillary: 115 mg/dL — ABNORMAL HIGH (ref 70–99)

## 2018-08-22 MED ORDER — SODIUM CHLORIDE 0.9 % IV SOLN
INTRAVENOUS | Status: DC
  Administered 2018-08-22 (×2): via INTRAVENOUS

## 2018-08-22 MED ORDER — INSULIN ASPART 100 UNIT/ML ~~LOC~~ SOLN
0.0000 [IU] | Freq: Four times a day (QID) | SUBCUTANEOUS | Status: DC
  Administered 2018-08-23 – 2018-08-24 (×4): 3 [IU] via SUBCUTANEOUS
  Administered 2018-08-24: 4 [IU] via SUBCUTANEOUS
  Administered 2018-08-24: 3 [IU] via SUBCUTANEOUS
  Administered 2018-08-25 (×2): 4 [IU] via SUBCUTANEOUS
  Administered 2018-08-25: 3 [IU] via SUBCUTANEOUS
  Administered 2018-08-26 (×2): 4 [IU] via SUBCUTANEOUS
  Administered 2018-08-26: 7 [IU] via SUBCUTANEOUS
  Administered 2018-08-27: 3 [IU] via SUBCUTANEOUS
  Administered 2018-08-27: 4 [IU] via SUBCUTANEOUS
  Administered 2018-08-27 (×2): 3 [IU] via SUBCUTANEOUS
  Administered 2018-08-28 (×3): 4 [IU] via SUBCUTANEOUS
  Administered 2018-08-28 (×2): 3 [IU] via SUBCUTANEOUS
  Administered 2018-08-29: 7 [IU] via SUBCUTANEOUS
  Administered 2018-08-29: 3 [IU] via SUBCUTANEOUS
  Administered 2018-08-29: 4 [IU] via SUBCUTANEOUS
  Administered 2018-08-30 – 2018-08-31 (×4): 3 [IU] via SUBCUTANEOUS
  Administered 2018-08-31: 7 [IU] via SUBCUTANEOUS
  Administered 2018-08-31: 4 [IU] via SUBCUTANEOUS
  Administered 2018-08-31 – 2018-09-01 (×2): 3 [IU] via SUBCUTANEOUS
  Administered 2018-09-01: 4 [IU] via SUBCUTANEOUS

## 2018-08-22 MED ORDER — KCL IN DEXTROSE-NACL 20-5-0.45 MEQ/L-%-% IV SOLN
INTRAVENOUS | Status: AC
  Administered 2018-08-22: 20:00:00 via INTRAVENOUS
  Filled 2018-08-22 (×2): qty 1000

## 2018-08-22 MED ORDER — CLOPIDOGREL BISULFATE 75 MG PO TABS
75.0000 mg | ORAL_TABLET | Freq: Every day | ORAL | Status: DC
  Administered 2018-08-23 – 2018-09-01 (×8): 75 mg via ORAL
  Filled 2018-08-22 (×8): qty 1

## 2018-08-22 NOTE — Progress Notes (Signed)
Subjective: Pt feels about the same.  AXR shows bowel obstruction  Objective: Vital signs in last 24 hours: Temp:  [97.9 F (36.6 C)-98.7 F (37.1 C)] 97.9 F (36.6 C) (08/12 0643) Pulse Rate:  [79-90] 79 (08/12 0643) Resp:  [18] 18 (08/12 0643) BP: (104-140)/(70-78) 140/70 (08/12 0643) SpO2:  [99 %-100 %] 99 % (08/12 0643)   Intake/Output from previous day: 08/11 0701 - 08/12 0700 In: 2212.6 [P.O.:480; I.V.:363.8; IV Piggyback:1368.9] Out: 750 [Urine:750] Intake/Output this shift: Total I/O In: 722 [P.O.:120; I.V.:602] Out: 500 [Urine:500]   General appearance: alert and cooperative GI: normal findings: soft, non-tender and distended Ostomy: beefy red, tight at fascia when digitized Incision: no significant drainage, no significant erythema  Lab Results:  No results for input(s): WBC, HGB, HCT, PLT in the last 72 hours. BMET Recent Labs    08/22/18 0600  NA 127*  K 4.3  CL 88*  CO2 26  GLUCOSE 182*  BUN 74*  CREATININE 1.59*  CALCIUM 8.7*   PT/INR No results for input(s): LABPROT, INR in the last 72 hours. ABG No results for input(s): PHART, HCO3 in the last 72 hours.  Invalid input(s): PCO2, PO2  MEDS, Scheduled . amLODipine  5 mg Oral Daily  . aspirin  81 mg Oral Daily  . atorvastatin  10 mg Oral q1800  . enoxaparin (LOVENOX) injection  40 mg Subcutaneous Q24H  . gabapentin  300 mg Oral BID  . glimepiride  1 mg Oral BID WC  . insulin aspart  0-20 Units Subcutaneous TID WC  . insulin aspart  0-5 Units Subcutaneous QHS  . levothyroxine  50 mcg Oral Daily  . metFORMIN  1,000 mg Oral Q breakfast  . triamterene-hydrochlorothiazide  1 tablet Oral Daily    Studies/Results: Dg Abd 2 Views  Result Date: 08/21/2018 CLINICAL DATA:  Dehydration EXAM: ABDOMEN - 2 VIEW COMPARISON:  None. FINDINGS: Scattered large and small bowel gas is noted. Multiple dilated loops of small bowel are noted suspicious for obstruction. Mild colonic gas is noted. No free air  is seen. IMPRESSION: Changes suggestive of small bowel obstruction. CT would be helpful for further evaluation. Electronically Signed   By: Inez Catalina M.D.   On: 08/21/2018 20:01    Assessment: s/p  Patient Active Problem List   Diagnosis Date Noted  . SBO (small bowel obstruction) ([place]) 08/22/2018  . Dehydration 08/21/2018  . Pre-operative cardiovascular examination 06/07/2018  . Rectal cancer ([place]) 11/06/2017  . Claudication in peripheral vascular disease ([place]) 05/08/2017  . Carotid artery disease ([place]) 04/14/2017  . PAD (peripheral artery disease) Rt ABI 0.7, Lt ABI 0.46 12/25/2012  . S/P arterial stent, 12/25/13, successful diamondback orbital rotational arthrectomy, PTA using chocolate  balloon and stenting using I DEV stent of long segment calcified high-grade proximal and mid r 12/25/2012  . Gangrene of toe, Rt second toe 12/25/2012  . Essential hypertension 12/13/2012  . Non-insulin treated type 2 diabetes mellitus ([place]) 12/13/2012  . Critical lower limb ischemia 12/13/2012  . Tobacco abuse 12/13/2012    Pt with early post op bowel obstruction.  Tight at ostomy site.     Plan: NPO  IVF's for dehydration Hyponatremia: due to high output ostomy.  Cont 0.9 NS, will recheck Na level this evening and tom am Red rubber catheter placed in proximal ileostomy to help decompress. If no change in AM, will place NG  LOS: 0 days     .Rosario Adie, MD [facility], [place]   08/22/2018  12:25 PM

## 2018-08-22 NOTE — Progress Notes (Signed)
Unable to chart the 1000 cc NS IV fluid given to Patient.  flowsheet  Blocked out.

## 2018-08-22 NOTE — Progress Notes (Addendum)
PHARMACY - ADULT TOTAL PARENTERAL NUTRITION CONSULT NOTE   Pharmacy Consult for TPN Indication: SBO  Patient Measurements:    Weight: 70 kg  Insulin Requirements: on glimepiride 1 mg bid, metformin XR 1000 qday, rSSI. CBGs 167-182.   Current Nutrition: NPO  IVF: NS at 125 ml/hr  Central access: PAC  TPN start date: 8/12  ASSESSMENT                                                                                                          HPI: 68 y.o. F s/p colorectal surgery and diverting ileostomy on 08/03/18. PMH: arthritis, back pain, depression, DM, GERD, HTN, hypercholesterolemia, rectal cancer, thyroid disease, PVD   Pt re-admitted 8/11 w/dehydration.   Significant events: SBO on KUB 8/11 PM  Today:    Glucose: on resistant SSI q6h; glimepiride 1 mg PO bid, metformin XR 1000 mg qday  Electrolytes: Na low 127,   Renal: SCr 1.59, BUN 74 - dehydrated on admission, SCr 2as 0.88> 1.24 end of July   LFTs:  TGs:  Prealbumin:  PLAN                                                                                                                         TPN to start 8/13. TPN labs and RD consult ordered  Eudelia Bunch, Pharm.D [phone] 08/22/2018 12:49 PM

## 2018-08-23 ENCOUNTER — Inpatient Hospital Stay (HOSPITAL_COMMUNITY): Payer: Medicare Other

## 2018-08-23 LAB — COMPREHENSIVE METABOLIC PANEL
ALT: 11 U/L (ref 0–44)
AST: 15 U/L (ref 15–41)
Albumin: 2.7 g/dL — ABNORMAL LOW (ref 3.5–5.0)
Alkaline Phosphatase: 70 U/L (ref 38–126)
Anion gap: 7 (ref 5–15)
BUN: 43 mg/dL — ABNORMAL HIGH (ref 8–23)
CO2: 23 mmol/L (ref 22–32)
Calcium: 8.5 mg/dL — ABNORMAL LOW (ref 8.9–10.3)
Chloride: 100 mmol/L (ref 98–111)
Creatinine, Ser: 1.02 mg/dL — ABNORMAL HIGH (ref 0.44–1.00)
GFR calc Af Amer: >60 mL/min (ref >60)
GFR calc non Af Amer: 56 mL/min — ABNORMAL LOW (ref >60)
Glucose, Bld: 137 mg/dL — ABNORMAL HIGH (ref 70–99)
Potassium: 4.5 mmol/L (ref 3.5–5.1)
Sodium: 130 mmol/L — ABNORMAL LOW (ref 135–145)
Total Bilirubin: 0.4 mg/dL (ref 0.3–1.2)
Total Protein: 5.3 g/dL — ABNORMAL LOW (ref 6.5–8.1)

## 2018-08-23 LAB — GLUCOSE, CAPILLARY
Glucose-Capillary: 118 mg/dL — ABNORMAL HIGH (ref 70–99)
Glucose-Capillary: 131 mg/dL — ABNORMAL HIGH (ref 70–99)
Glucose-Capillary: 138 mg/dL — ABNORMAL HIGH (ref 70–99)
Glucose-Capillary: 147 mg/dL — ABNORMAL HIGH (ref 70–99)
Glucose-Capillary: 95 mg/dL (ref 70–99)

## 2018-08-23 LAB — DIFFERENTIAL
Abs Immature Granulocytes: 0.02 10*3/uL (ref 0.00–0.07)
Basophils Absolute: 0 10*3/uL (ref 0.0–0.1)
Basophils Relative: 0 %
Eosinophils Absolute: 0 10*3/uL (ref 0.0–0.5)
Eosinophils Relative: 1 %
Immature Granulocytes: 0 %
Lymphocytes Relative: 14 %
Lymphs Abs: 0.6 10*3/uL — ABNORMAL LOW (ref 0.7–4.0)
Monocytes Absolute: 0.4 10*3/uL (ref 0.1–1.0)
Monocytes Relative: 9 %
Neutro Abs: 3.4 10*3/uL (ref 1.7–7.7)
Neutrophils Relative %: 76 %

## 2018-08-23 LAB — CBC
HCT: 24.3 % — ABNORMAL LOW (ref 36.0–46.0)
Hemoglobin: 7.9 g/dL — ABNORMAL LOW (ref 12.0–15.0)
MCH: 30.5 pg (ref 26.0–34.0)
MCHC: 32.5 g/dL (ref 30.0–36.0)
MCV: 93.8 fL (ref 80.0–100.0)
Platelets: 346 10*3/uL (ref 150–400)
RBC: 2.59 MIL/uL — ABNORMAL LOW (ref 3.87–5.11)
RDW: 13.4 % (ref 11.5–15.5)
WBC: 4.5 10*3/uL (ref 4.0–10.5)
nRBC: 0 % (ref 0.0–0.2)

## 2018-08-23 LAB — PHOSPHORUS: Phosphorus: 2.2 mg/dL — ABNORMAL LOW (ref 2.5–4.6)

## 2018-08-23 LAB — PREALBUMIN: Prealbumin: 14.2 mg/dL — ABNORMAL LOW (ref 18–38)

## 2018-08-23 LAB — MAGNESIUM: Magnesium: 2.1 mg/dL (ref 1.7–2.4)

## 2018-08-23 LAB — TRIGLYCERIDES: Triglycerides: 86 mg/dL (ref <150)

## 2018-08-23 MED ORDER — SODIUM CHLORIDE 0.9 % IV SOLN
INTRAVENOUS | Status: DC | PRN
  Administered 2018-08-23 – 2018-08-24 (×2): 500 mL via INTRAVENOUS

## 2018-08-23 MED ORDER — PHENOL 1.4 % MT LIQD
2.0000 | OROMUCOSAL | Status: DC | PRN
  Administered 2018-08-23: 2 via OROMUCOSAL
  Filled 2018-08-23: qty 177

## 2018-08-23 MED ORDER — KCL IN DEXTROSE-NACL 20-5-0.45 MEQ/L-%-% IV SOLN
INTRAVENOUS | Status: DC
  Administered 2018-08-23: 18:00:00 via INTRAVENOUS
  Filled 2018-08-23: qty 1000

## 2018-08-23 MED ORDER — TRAVASOL 10 % IV SOLN
INTRAVENOUS | Status: AC
  Administered 2018-08-23: 17:00:00 via INTRAVENOUS
  Filled 2018-08-23: qty 480

## 2018-08-23 MED ORDER — SODIUM PHOSPHATES 45 MMOLE/15ML IV SOLN
20.0000 mmol | Freq: Once | INTRAVENOUS | Status: AC
  Administered 2018-08-23: 20 mmol via INTRAVENOUS
  Filled 2018-08-23: qty 6.67

## 2018-08-23 NOTE — Progress Notes (Addendum)
Subjective: Pt feels about the same.  AXR shows improving bowel obstruction, ostomy drain not putting much out  Objective: Vital signs in last 24 hours: Temp:  [97.4 F (36.3 C)-98.7 F (37.1 C)] 98.7 F (37.1 C) (08/13 0643) Pulse Rate:  [70-85] 85 (08/13 0643) Resp:  [18-19] 18 (08/13 0643) BP: (130-153)/(68-73) 130/69 (08/13 0643) SpO2:  [100 %] 100 % (08/13 0643) Weight:  [64.9 kg] 64.9 kg (08/12 1230)   Intake/Output from previous day: 08/12 0701 - 08/13 0700 In: 2198.8 [P.O.:600; I.V.:1598.8] Out: 2225 [Urine:2200; Stool:25] Intake/Output this shift: Total I/O In: 30 [P.O.:30] Out: -    General appearance: alert and cooperative GI: normal findings: soft, non-tender and distended Ostomy: beefy red, tight at fascia when digitized Incision: no significant drainage, no significant erythema  Lab Results:  Recent Labs    08/23/18 0355  WBC 4.5  HGB 7.9*  HCT 24.3*  PLT 346   BMET Recent Labs    08/22/18 1829 08/23/18 0355  NA 131* 130*  K 4.3 4.5  CL 98 100  CO2 26 23  GLUCOSE 102* 137*  BUN 55* 43*  CREATININE 1.13* 1.02*  CALCIUM 8.8* 8.5*   PT/INR No results for input(s): LABPROT, INR in the last 72 hours. ABG No results for input(s): PHART, HCO3 in the last 72 hours.  Invalid input(s): PCO2, PO2  MEDS, Scheduled . amLODipine  5 mg Oral Daily  . aspirin  81 mg Oral Daily  . atorvastatin  10 mg Oral q1800  . clopidogrel  75 mg Oral Daily  . enoxaparin (LOVENOX) injection  40 mg Subcutaneous Q24H  . gabapentin  300 mg Oral BID  . glimepiride  1 mg Oral BID WC  . insulin aspart  0-20 Units Subcutaneous Q6H  . levothyroxine  50 mcg Oral Daily  . metFORMIN  1,000 mg Oral Q breakfast  . triamterene-hydrochlorothiazide  1 tablet Oral Daily    Studies/Results: Dg Abd 2 Views  Result Date: 08/21/2018 CLINICAL DATA:  Dehydration EXAM: ABDOMEN - 2 VIEW COMPARISON:  None. FINDINGS: Scattered large and small bowel gas is noted. Multiple dilated  loops of small bowel are noted suspicious for obstruction. Mild colonic gas is noted. No free air is seen. IMPRESSION: Changes suggestive of small bowel obstruction. CT would be helpful for further evaluation. Electronically Signed   By: Inez Catalina M.D.   On: 08/21/2018 20:01   Dg Abd Portable 1v  Result Date: 08/23/2018 CLINICAL DATA:  Bowel obstruction EXAM: PORTABLE ABDOMEN - 1 VIEW COMPARISON:  August 21, 2018 abdominal radiograph; CT abdomen and pelvis January 17, 2018 FINDINGS: There remain multiple loops of dilated bowel. No air-fluid level seen on this supine examination. No free air evident. There is iliac artery atherosclerotic plaque bilaterally. There is a calcification in the left kidney region measuring 6 x 4 mm. IMPRESSION: Persistent bowel dilatation concerning for either ileus or a degree of bowel obstruction. No free air evident. Left renal calculus measuring 6 x 4 mm again noted. Electronically Signed   By: Lowella Grip III M.D.   On: 08/23/2018 07:48    Assessment: s/p  Patient Active Problem List   Diagnosis Date Noted  . SBO (small bowel obstruction) ([place]) 08/22/2018  . Dehydration 08/21/2018  . Pre-operative cardiovascular examination 06/07/2018  . Rectal cancer ([place]) 11/06/2017  . Claudication in peripheral vascular disease ([place]) 05/08/2017  . Carotid artery disease ([place]) 04/14/2017  . PAD (peripheral artery disease) Rt ABI 0.7, Lt ABI 0.46 12/25/2012  . S/P  arterial stent, 12/25/13, successful diamondback orbital rotational arthrectomy, PTA using chocolate  balloon and stenting using I DEV stent of long segment calcified high-grade proximal and mid r 12/25/2012  . Gangrene of toe, Rt second toe 12/25/2012  . Essential hypertension 12/13/2012  . Non-insulin treated type 2 diabetes mellitus ([place]) 12/13/2012  . Critical lower limb ischemia 12/13/2012  . Tobacco abuse 12/13/2012    Pt with early post op bowel obstruction.  Tight at ostomy site.     Plan: NPO   IVF's for dehydration Hyponatremia: due to high output ostomy. This is better with replacements and hydration D/c red rubber catheter placed in ileostomy to help decompress. Cont NG  LOS: 1 day     .Rosario Adie, [facility], [place]   08/23/2018 8:36 AM

## 2018-08-23 NOTE — Progress Notes (Addendum)
[place] NOTE   Pharmacy Consult for TPN Indication: SBO  Patient Measurements:Weight: 143 lb 3 oz (64.9 kg)   Weight: 70 kg  Insulin Requirements: on glimepiride 1 mg bid, metformin XR 1000 qday, rSSI. 17 units SSI/24 hrs; CBGs 98-147.   Current Nutrition: NPO  IVF: NS at 125 ml/hr changed to D545 20 K at 75 ml/hr  Central access: PAC  TPN start date: 8/12  ASSESSMENT                                                                                                          HPI: 68 y.o. F s/p colorectal surgery and diverting ileostomy on 08/03/18. PMH: arthritis, back pain, depression, DM, GERD, HTN, hypercholesterolemia, rectal cancer, thyroid disease, PVD   Pt re-admitted 8/11 w/dehydration.   Significant events: SBO on KUB 8/11 PM  Today:    Glucose: on resistant SSI q6h; glimepiride 1 mg PO bid, metformin XR 1000 mg qday; 17 units SSI/24 hrs, CBGs 98-147 pre-TPN  Electrolytes: Na low 130, CoCa 9.54, Phos low at 2.2, mag 2.1  Renal: SCr 1.59>>1.02, BUN 74_ 43 - dehydrated on admission is improving, SCr 2as 0.88> 1.24 end of July   LFTs: WNL 8/13  TGs: 86 8/13  Prealbumin: 14.2 8/13  NUTRITIONAL GOALS                                                                                             RD recs:  pending  Custom TPN at goal rate of 70 ml/hr provides: - 84 g/day protein (50 g/L) - 50.4  g/day Lipid  (30 g/L) - 235.2  g/day Dextrose (14 %) -  1639.38 Kcal/day  PLAN                                                                                                                Now: Naphos 20 mMol          At 1800 today:  Start TPN at 40  ml/hr.  -  Provides 48 g of protein,  937 kCals per day.   Goal rate of 70 ml/hr will provide 84 gm protein and ~ 1640 kcal for full support  Electrolytes in TPN: standard x increased Na, Cl:Ac ratio 1:1  Plan to advance as tolerated to the goal rate.  TPN to contain standard  multivitamins and trace elements on MWF only due to national shortage  Reduce IVF to 35 ml/hr to keep total IVF 75 ml/hr or per CCS  Continue resistant SSI q6h; Add 10 units insulin to TPN  TPN lab panels on Mondays & Thursdays. BMET, Mg and Phos in AM  F/u daily.  Eudelia Bunch, Pharm.D [phone] 08/23/2018 8:59 AM

## 2018-08-23 NOTE — Progress Notes (Signed)
Initial Nutrition Assessment  INTERVENTION:   Monitor magnesium, potassium, and phosphorus daily for at least 3 days, MD to replete as needed, as pt is at risk for refeeding syndrome given poor PO PTA, low Phos levels and recent weight loss.  TPN per Pharmacy  NUTRITION DIAGNOSIS:   Inadequate oral intake related to inability to eat(SBO) as evidenced by NPO status.  GOAL:   Patient will meet greater than or equal to 90% of their needs  MONITOR:   Diet advancement, Labs, Weight trends, I & O's(TPN)  REASON FOR ASSESSMENT:   Consult New TPN/TNA  ASSESSMENT:   68 y.o. F s/p colorectal surgery and diverting ileostomy. Admitted for dehydration and SBO.  **RD working remotely**  Patient with recent admission in July in which she had colorectal surgery with ileostomy. At that time pt's weight was stable and pt was tolerating fulls and soft diet.  Since discharge, pt has not been eating much and has not been taking in as much fluid as she should.   Ileostomy output has decreased. Pt now NPO and starting TPN today at 40 ml/hr (providing 936 kcal and 48g protein).  Per weight records, pt has lost 16 lbs since 5/28 (10% wt loss x 2.5 months, significant for time frame).   NGT output: 300 ml  Medications: D5 and .45% NaCl w/ KCl infusion at 35 Labs reviewed: CBGs: 118-147 TG: 86 mg/dL Low Na, Phos Mg WNL  NUTRITION - FOCUSED PHYSICAL EXAM:  Unable to perform -working remotely.  Diet Order:   Diet Order            Diet NPO time specified Except for: Ice Chips  Diet effective now              EDUCATION NEEDS:   No education needs have been identified at this time  Skin:  Skin Assessment: Reviewed RN Assessment  Last BM:  8/11  Height:   Ht Readings from Last 1 Encounters:  08/03/18 5\' 6"  (1.676 m)    Weight:   Wt Readings from Last 1 Encounters:  08/22/18 64.9 kg    Ideal Body Weight:  59.1 kg  BMI:  Body mass index is 23.11 kg/m.  Estimated  Nutritional Needs:   Kcal:  1800-2000  Protein:  80-90g  Fluid:  2L/day  Clayton Bibles, MS, RD, LDN [place] Dietitian Pager: [phone] After Hours Pager: [phone]

## 2018-08-23 NOTE — Progress Notes (Signed)
Upon insertion of NG tube, the pt had an output of 300 ml. I will continue to monitor.

## 2018-08-23 NOTE — Progress Notes (Signed)
Pt experienced an unwitnessed fall. Leighton Ruff, MD was paged. Vitals WNL, pt reported no pain and did not hit her head. I will continue to monitor.

## 2018-08-24 ENCOUNTER — Inpatient Hospital Stay (HOSPITAL_COMMUNITY): Payer: Medicare Other

## 2018-08-24 LAB — MAGNESIUM: Magnesium: 1.9 mg/dL (ref 1.7–2.4)

## 2018-08-24 LAB — GLUCOSE, CAPILLARY
Glucose-Capillary: 113 mg/dL — ABNORMAL HIGH (ref 70–99)
Glucose-Capillary: 142 mg/dL — ABNORMAL HIGH (ref 70–99)
Glucose-Capillary: 148 mg/dL — ABNORMAL HIGH (ref 70–99)
Glucose-Capillary: 172 mg/dL — ABNORMAL HIGH (ref 70–99)

## 2018-08-24 LAB — BASIC METABOLIC PANEL
Anion gap: 8 (ref 5–15)
BUN: 28 mg/dL — ABNORMAL HIGH (ref 8–23)
CO2: 25 mmol/L (ref 22–32)
Calcium: 8.8 mg/dL — ABNORMAL LOW (ref 8.9–10.3)
Chloride: 99 mmol/L (ref 98–111)
Creatinine, Ser: 0.92 mg/dL (ref 0.44–1.00)
GFR calc Af Amer: >60 mL/min (ref >60)
GFR calc non Af Amer: >60 mL/min (ref >60)
Glucose, Bld: 141 mg/dL — ABNORMAL HIGH (ref 70–99)
Potassium: 4.1 mmol/L (ref 3.5–5.1)
Sodium: 132 mmol/L — ABNORMAL LOW (ref 135–145)

## 2018-08-24 LAB — PHOSPHORUS: Phosphorus: 3.2 mg/dL (ref 2.5–4.6)

## 2018-08-24 MED ORDER — SODIUM CHLORIDE 0.9 % IV SOLN: INTRAVENOUS | Status: DC

## 2018-08-24 MED ORDER — TRAVASOL 10 % IV SOLN
INTRAVENOUS | Status: AC
  Administered 2018-08-24: 18:00:00 via INTRAVENOUS
  Filled 2018-08-24: qty 883.2

## 2018-08-24 MED ORDER — MENTHOL 3 MG MT LOZG
1.0000 | LOZENGE | OROMUCOSAL | Status: DC | PRN
  Administered 2018-08-24: 3 mg via ORAL
  Filled 2018-08-24: qty 9

## 2018-08-24 MED ORDER — SODIUM CHLORIDE 0.9 % IV SOLN: INTRAVENOUS | Status: AC

## 2018-08-24 MED ORDER — MAGNESIUM SULFATE IN D5W 1-5 GM/100ML-% IV SOLN
1.0000 g | Freq: Once | INTRAVENOUS | Status: AC
  Administered 2018-08-24: 1 g via INTRAVENOUS
  Filled 2018-08-24: qty 100

## 2018-08-24 NOTE — Consult Note (Signed)
[facility] Nurse ostomy consult note Stoma type/location: LUQ ileostomy with decompression red rubber catheter. ORders to remove catheter.   Stomal assessment/size: 1 1/2" round pink and moist  Peristomal assessment: not assessed  Pouch intact Treatment options for stomal/peristomal skin: 1 piece pouch with barrier ring Output Liquid pink output.   Ostomy pouching: 1pc.flat with barrier ring Education provided:  Explained that I was here to remove decompression catheter that was in place.  Catheter is removed and contents of pouch emptied.  Pouch is rolled closed. Additional pouch is placed above sink with barrier ring.  Enrolled patient in [facility] program: Yes previously Will not follow at this time.  Please re-consult if needed.  Domenic Moras MSN, RN, FNP-BC CWON Wound, Ostomy, Continence Nurse Pager [phone]

## 2018-08-24 NOTE — Care Management Important Message (Signed)
Important Message  Patient Details IM Letter given to Marney Doctor RN to present to the Patient Name: Nicole Sellers MRN: [id number] Date of Birth: 1950/08/26   Medicare Important Message Given:  Yes     Kerin Salen 08/24/2018, 9:39 AM

## 2018-08-24 NOTE — Progress Notes (Signed)
[facility] NOTE   Pharmacy Consult for TPN Indication: SBO  Patient Measurements:Weight: 143 lb 3 oz (64.9 kg)   Weight: 70 kg  Insulin Requirements: on glimepiride 1 mg bid, metformin XR 1000 qday, rSSI. 6 units SSI/24 hrs; CBGs  95-142 after TPN with 10 units insulin hung Current Nutrition: NPO, TPN  IVF: D545 20 K at 35 ml/hr  Central access: PAC  TPN start date: 8/12  ASSESSMENT                                                                                                          HPI: 68 y.o. F s/p colorectal surgery and diverting ileostomy on 08/03/18. PMH: arthritis, back pain, depression, DM, GERD, HTN, hypercholesterolemia, rectal cancer, thyroid disease, PVD   Pt re-admitted 8/11 w/dehydration.   Significant events: SBO on KUB 8/11 PM  Today: 8/14 KUB slight improvement of SBO   Glucose: on resistant SSI q6h; glimepiride 1 mg PO bid, metformin XR 1000 mg qday; 6 units SSI/24 hrs, CBGs 95-142 after TPN w/ 10 units hung  Electrolytes: Na low due to high output ostomy, up to 132, CoCa 9.84, Phos up to 3.2 after 20 mM Naphos yesterday, mag 1.9 (goal >=2 w/ SBO)   Renal: on Maxzide 75-50 qday; SCr 1.59>>0.92, BUN 74> 28 dehydrated on admission is improving, SCr 2as 0.88> 1.24 end of July; I/O -452; NGO 1050 ml/, stool 30 ml, UOP 700 ml   LFTs: WNL 8/13  TGs: 86 8/13  Prealbumin: 14.2 8/13  NUTRITIONAL GOALS                                                                                             RD recs: 8/13  Kcal:  1800-2000 Protein:  80-90g Fluid:  2L/day  Custom TPN at goal rate of 80 ml/hr provides: - 88.32 g/day protein (46 g/L) - 57.6 g/day Lipid  (30 g/L) - 268.8  g/day Dextrose (14 %) -  1843 Kcal/day  PLAN                                                                                                            Now: 1 gm mag  At 1800 today:  Increase TPN to goal rate of 80 ml/hr.  Goal rate of 80  ml/hr will provide 88 gm protein and ~ 1843 kcal for full support  Electrolytes in TPN: standard x increased Na, Cl:Ac ratio 1:1  TPN to contain standard multivitamins daily and trace elements on MWF only due to national shortage  DC IVF at 1800 when TPN advanced to 80 ml/hr or per CCS  Continue resistant SSI q6h and glimepiride and metformin; Add 20 units insulin to TPN  TPN lab panels on Mondays & Thursdays. BMET, Mg and Phos in AM  F/u daily.  Eudelia Bunch, Pharm.D [phone] 08/24/2018 7:20 AM

## 2018-08-24 NOTE — Progress Notes (Addendum)
Subjective: Pt feels about the same.  AXR shows continued bowel obstruction, ostomy drain not putting much out  Objective: Vital signs in last 24 hours: Temp:  [97.9 F (36.6 C)-99 F (37.2 C)] 98.2 F (36.8 C) (08/14 0626) Pulse Rate:  [71-80] 72 (08/14 0626) Resp:  [15-18] 15 (08/14 0626) BP: (118-136)/(56-66) 136/66 (08/14 0626) SpO2:  [99 %-100 %] 100 % (08/14 0626)   Intake/Output from previous day: 08/13 0701 - 08/14 0700 In: 1328.1 [P.O.:150; I.V.:921.8; IV Piggyback:256.3] Out: 1780 [Urine:700; Emesis/NG output:1050; Stool:30] Intake/Output this shift: No intake/output data recorded.   General appearance: alert and cooperative GI: normal findings: soft, non-tender and distended Ostomy: beefy red, tight at fascia when digitized Incision: no significant drainage, no significant erythema  Lab Results:  Recent Labs    08/23/18 0355  WBC 4.5  HGB 7.9*  HCT 24.3*  PLT 346   BMET Recent Labs    08/23/18 0355 08/24/18 0322  NA 130* 132*  K 4.5 4.1  CL 100 99  CO2 23 25  GLUCOSE 137* 141*  BUN 43* 28*  CREATININE 1.02* 0.92  CALCIUM 8.5* 8.8*   PT/INR No results for input(s): LABPROT, INR in the last 72 hours. ABG No results for input(s): PHART, HCO3 in the last 72 hours.  Invalid input(s): PCO2, PO2  MEDS, Scheduled . amLODipine  5 mg Oral Daily  . aspirin  81 mg Oral Daily  . atorvastatin  10 mg Oral q1800  . clopidogrel  75 mg Oral Daily  . enoxaparin (LOVENOX) injection  40 mg Subcutaneous Q24H  . gabapentin  300 mg Oral BID  . glimepiride  1 mg Oral BID WC  . insulin aspart  0-20 Units Subcutaneous Q6H  . levothyroxine  50 mcg Oral Daily  . metFORMIN  1,000 mg Oral Q breakfast  . triamterene-hydrochlorothiazide  1 tablet Oral Daily    Studies/Results: Dg Abd Portable 1v  Result Date: 08/24/2018 CLINICAL DATA:  Follow up small bowel obstruction EXAM: PORTABLE ABDOMEN - 1 VIEW COMPARISON:  08/23/2018 FINDINGS: Gastric catheter is again  noted within the stomach. Mildly dilated small bowel is noted in the left abdomen although mildly improved when compared with the prior exam. Catheter is noted within the patient's ileostomy in the right lower quadrant stable from the prior exam. IMPRESSION: Slight improvement in the degree of small bowel dilatation when compared with the previous day. Electronically Signed   By: Inez Catalina M.D.   On: 08/24/2018 07:24   Dg Abd Portable 1v  Result Date: 08/23/2018 CLINICAL DATA:  NG tube placement EXAM: PORTABLE ABDOMEN - 1 VIEW COMPARISON:  08/23/2018. FINDINGS: NG tube noted with its tip coiled stomach. No gastric distention. Persistent severe distended loops of small bowel are again noted. Small-bowel obstruction cannot be excluded. No free air. Surgical tubing again noted over the right abdomen. IMPRESSION: 1.  NG tube noted coiled stomach.  No gastric distention. 2. Persistent severely distended loops of small bowel. Small-bowel obstruction cannot be excluded. Continued follow-up exam suggested to demonstrate resolution. Electronically Signed   By: Marcello Moores  Register   On: 08/23/2018 13:37   Dg Abd Portable 1v  Result Date: 08/23/2018 CLINICAL DATA:  Bowel obstruction EXAM: PORTABLE ABDOMEN - 1 VIEW COMPARISON:  August 21, 2018 abdominal radiograph; CT abdomen and pelvis January 17, 2018 FINDINGS: There remain multiple loops of dilated bowel. No air-fluid level seen on this supine examination. No free air evident. There is iliac artery atherosclerotic plaque bilaterally. There is a calcification in  the left kidney region measuring 6 x 4 mm. IMPRESSION: Persistent bowel dilatation concerning for either ileus or a degree of bowel obstruction. No free air evident. Left renal calculus measuring 6 x 4 mm again noted. Electronically Signed   By: Lowella Grip III M.D.   On: 08/23/2018 07:48    Assessment: s/p  Patient Active Problem List   Diagnosis Date Noted  . SBO (small bowel obstruction) ([place])  08/22/2018  . Dehydration 08/21/2018  . Pre-operative cardiovascular examination 06/07/2018  . Rectal cancer ([place]) 11/06/2017  . Claudication in peripheral vascular disease ([place]) 05/08/2017  . Carotid artery disease ([place]) 04/14/2017  . PAD (peripheral artery disease) Rt ABI 0.7, Lt ABI 0.46 12/25/2012  . S/P arterial stent, 12/25/13, successful diamondback orbital rotational arthrectomy, PTA using chocolate  balloon and stenting using I DEV stent of long segment calcified high-grade proximal and mid r 12/25/2012  . Gangrene of toe, Rt second toe 12/25/2012  . Essential hypertension 12/13/2012  . Non-insulin treated type 2 diabetes mellitus ([place]) 12/13/2012  . Critical lower limb ischemia 12/13/2012  . Tobacco abuse 12/13/2012    Pt with early post op bowel obstruction.  Tight at ostomy site.     Plan: NPO  IVF's for dehydration Hyponatremia: due to high output ostomy. This is better with replacements and hydration Cont red rubber catheter placed in ileostomy to help decompress. Insert NG TPN  LOS: 2 days     .Rosario Adie, [facility], [place]   08/24/2018 7:41 AM

## 2018-08-24 NOTE — TOC Progression Note (Signed)
Transition of Care [facility]) - Progression Note    Patient Details  Name: BRITTENY FIEBELKORN MRN: [id number] Date of Birth: 16-Jan-1950  Transition of [facility]) CM/SW Contact  Ric Rosenberg, Marjie Skiff, RN Phone Number: 08/24/2018, 1:13 PM   Readmission Risk Interventions Readmission Risk Prevention Plan 08/24/2018  Transportation Screening Complete  Medication Review ([place]) Complete  PCP or Specialist appointment within 3-5 days of discharge Not Complete  PCP/Specialist Appt Not Complete comments Not ready for dc  HRI or [place] Complete  SW Recovery Care/Counseling Consult Not Complete  SW Consult Not Complete Comments NA  Palliative Care Screening Not [place] Not Applicable  Some recent data might be hidden

## 2018-08-25 ENCOUNTER — Encounter (HOSPITAL_COMMUNITY): Payer: Self-pay | Admitting: *Deleted

## 2018-08-25 LAB — MAGNESIUM: Magnesium: 2.1 mg/dL (ref 1.7–2.4)

## 2018-08-25 LAB — GLUCOSE, CAPILLARY
Glucose-Capillary: 158 mg/dL — ABNORMAL HIGH (ref 70–99)
Glucose-Capillary: 156 mg/dL — ABNORMAL HIGH (ref 70–99)
Glucose-Capillary: 129 mg/dL — ABNORMAL HIGH (ref 70–99)

## 2018-08-25 LAB — BASIC METABOLIC PANEL
Anion gap: 11 (ref 5–15)
BUN: 29 mg/dL — ABNORMAL HIGH (ref 8–23)
CO2: 29 mmol/L (ref 22–32)
Calcium: 9.5 mg/dL (ref 8.9–10.3)
Chloride: 99 mmol/L (ref 98–111)
Creatinine, Ser: 0.82 mg/dL (ref 0.44–1.00)
GFR calc Af Amer: >60 mL/min (ref >60)
GFR calc non Af Amer: >60 mL/min (ref >60)
Glucose, Bld: 110 mg/dL — ABNORMAL HIGH (ref 70–99)
Potassium: 3.9 mmol/L (ref 3.5–5.1)
Sodium: 139 mmol/L (ref 135–145)

## 2018-08-25 LAB — PHOSPHORUS: Phosphorus: 4.3 mg/dL (ref 2.5–4.6)

## 2018-08-25 MED ORDER — TRAVASOL 10 % IV SOLN
INTRAVENOUS | Status: AC
  Administered 2018-08-25: 17:00:00 via INTRAVENOUS
  Filled 2018-08-25: qty 883.2

## 2018-08-25 NOTE — Plan of Care (Signed)
Patient lying in bed this morning; states she does not feel well and requesting pain medication. No other needs expressed. Will continue to monitor.

## 2018-08-25 NOTE — Progress Notes (Signed)
[place] NOTE   Pharmacy Consult for TPN Indication: SBO  Patient Measurements:Weight: 143 lb 3 oz (64.9 kg)   Weight: 70 kg  Insulin Requirements: on glimepiride 1 mg bid, metformin XR 1000 qday, rSSI. 11 units SSI/24 hrs; CBGs  172 & 158 after TPN with 20 units insulin hung Current Nutrition: NPO, TPN  IVF: dc'd 8/14  Central access: PAC  TPN start date: 8/12  ASSESSMENT                                                                                                          HPI: 68 y.o. F s/p colorectal surgery and diverting ileostomy on 08/03/18. PMH: arthritis, back pain, depression, DM, GERD, HTN, hypercholesterolemia, rectal cancer, thyroid disease, PVD   Pt re-admitted 8/11 w/dehydration.   Significant events: SBO on KUB 8/11 PM  Today: 8/14 KUB slight improvement of SBO   Glucose: on resistant SSI q6h; glimepiride 1 mg PO bid, metformin XR 1000 mg qday; 11 units SSI/24 hrs, CBGs 158 - 172 after TPN w/ 20 units hung  Electrolytes: Na was low on admission due to high output ostomy  - now WNL, K 3.9 ( goal >=4 w/SBO)  CoCa 10.54, Phos 4.3,  mag 2.1 after 1 gm bolus yesterday (goal >=2 w/ SBO)   Renal: on Maxzide 75-50 qday; SCr 1.59>>0.82, BUN 74> 29 dehydrated on admission - now resolved, SCr was 0.88> 1.24 end of July; I/O -567; NGO 1550 ml/, stool 650 ml, UOP 600 ml   LFTs: WNL 8/13  TGs: 86 8/13  Prealbumin: 14.2 8/13  NUTRITIONAL GOALS                                                                                             RD recs: 8/13  Kcal:  1800-2000 Protein:  80-90g Fluid:  2L/day  Custom TPN at goal rate of 80 ml/hr provides: - 88.32 g/day protein (46 g/L) - 57.6 g/day Lipid  (30 g/L) - 268.8  g/day Dextrose (14 %) -  1843 Kcal/day  PLAN  At 1800 today:  continue TPN at goal rate of 80  ml/hr.  Goal rate of 80 ml/hr provides 88 gm protein and ~ 1843 kcal for full support  Electrolytes in TPN: standard x increased Na, increased K, Cl:Ac ratio 1:1  TPN to contain standard multivitamins daily and trace elements on MWF only due to national shortage  Continue resistant SSI q6h and glimepiride and metformin  Increase insulin in TPN to 25 units insulin to TPN  TPN lab panels on Mondays & Thursdays. BMET, Mg and Phos in AM  F/u daily.  Eudelia Bunch, Pharm.D [phone] 08/25/2018 7:25 AM

## 2018-08-25 NOTE — Progress Notes (Signed)
Subjective/Chief Complaint: Comfortable Denies abdominal pain   Objective: Vital signs in last 24 hours: Temp:  [98.2 F (36.8 C)-99.5 F (37.5 C)] 99.5 F (37.5 C) (08/15 0536) Pulse Rate:  [77-88] 88 (08/15 0536) Resp:  [16-18] 18 (08/15 0536) BP: (119-130)/(54-66) 130/62 (08/15 0536) SpO2:  [100 %] 100 % (08/15 0536) Last BM Date: 08/21/18  Intake/Output from previous day: 08/14 0701 - 08/15 0700 In: 2233.2 [P.O.:200; I.V.:1958.8; IV Piggyback:74.5] Out: 2800 [Urine:600; Emesis/NG output:1550; Stool:650] Intake/Output this shift: Total I/O In: 0  Out: 100 [Emesis/NG output:100]  Exam: Up on toilet Awake and alert Appears comfortable Abdomen soft, ostomy  Productive, red rubber cath in ostomy NG output with thick bile  Lab Results:  Recent Labs    08/23/18 0355  WBC 4.5  HGB 7.9*  HCT 24.3*  PLT 346   BMET Recent Labs    08/24/18 0322 08/25/18 0313  NA 132* 139  K 4.1 3.9  CL 99 99  CO2 25 29  GLUCOSE 141* 110*  BUN 28* 29*  CREATININE 0.92 0.82  CALCIUM 8.8* 9.5   PT/INR No results for input(s): LABPROT, INR in the last 72 hours. ABG No results for input(s): PHART, HCO3 in the last 72 hours.  Invalid input(s): PCO2, PO2  Studies/Results: Dg Abd Portable 1v  Result Date: 08/24/2018 CLINICAL DATA:  Follow up small bowel obstruction EXAM: PORTABLE ABDOMEN - 1 VIEW COMPARISON:  08/23/2018 FINDINGS: Gastric catheter is again noted within the stomach. Mildly dilated small bowel is noted in the left abdomen although mildly improved when compared with the prior exam. Catheter is noted within the patient's ileostomy in the right lower quadrant stable from the prior exam. IMPRESSION: Slight improvement in the degree of small bowel dilatation when compared with the previous day. Electronically Signed   By: Inez Catalina M.D.   On: 08/24/2018 07:24   Dg Abd Portable 1v  Result Date: 08/23/2018 CLINICAL DATA:  NG tube placement EXAM: PORTABLE ABDOMEN -  1 VIEW COMPARISON:  08/23/2018. FINDINGS: NG tube noted with its tip coiled stomach. No gastric distention. Persistent severe distended loops of small bowel are again noted. Small-bowel obstruction cannot be excluded. No free air. Surgical tubing again noted over the right abdomen. IMPRESSION: 1.  NG tube noted coiled stomach.  No gastric distention. 2. Persistent severely distended loops of small bowel. Small-bowel obstruction cannot be excluded. Continued follow-up exam suggested to demonstrate resolution. Electronically Signed   By: Marcello Moores  Register   On: 08/23/2018 13:37    Anti-infectives: Anti-infectives (From admission, onward)   None      Assessment/Plan: s/p  Patient Active Problem List   Diagnosis Date Noted  . SBO (small bowel obstruction) ([place]) 08/22/2018  . Dehydration 08/21/2018  . Pre-operative cardiovascular examination 06/07/2018  . Rectal cancer ([place]) 11/06/2017  . Claudication in peripheral vascular disease ([place]) 05/08/2017  . Carotid artery disease ([place]) 04/14/2017  . PAD (peripheral artery disease) Rt ABI 0.7, Lt ABI 0.46 12/25/2012  . S/P arterial stent, 12/25/13, successful diamondback orbital rotational arthrectomy, PTA using chocolate  balloon and stenting using I DEV stent of long segment calcified high-grade proximal and mid r 12/25/2012  . Gangrene of toe, Rt second toe 12/25/2012  . Essential hypertension 12/13/2012  . Non-insulin treated type 2 diabetes mellitus ([place]) 12/13/2012  . Critical lower limb ischemia 12/13/2012  . Tobacco abuse 12/13/2012    Pt with early post op bowel obstruction.  Tight at ostomy site.      Continue NPO  and NG (greater than 1 liter out last 24 hours Electrolytes improved  LOS: 3 days    Coralie Keens 08/25/2018

## 2018-08-26 LAB — BASIC METABOLIC PANEL
Anion gap: 15 (ref 5–15)
BUN: 37 mg/dL — ABNORMAL HIGH (ref 8–23)
CO2: 31 mmol/L (ref 22–32)
Calcium: 9.7 mg/dL (ref 8.9–10.3)
Chloride: 96 mmol/L — ABNORMAL LOW (ref 98–111)
Creatinine, Ser: 0.92 mg/dL (ref 0.44–1.00)
GFR calc Af Amer: >60 mL/min (ref >60)
GFR calc non Af Amer: >60 mL/min (ref >60)
Glucose, Bld: 127 mg/dL — ABNORMAL HIGH (ref 70–99)
Potassium: 4.6 mmol/L (ref 3.5–5.1)
Sodium: 142 mmol/L (ref 135–145)

## 2018-08-26 LAB — GLUCOSE, CAPILLARY
Glucose-Capillary: 113 mg/dL — ABNORMAL HIGH (ref 70–99)
Glucose-Capillary: 155 mg/dL — ABNORMAL HIGH (ref 70–99)
Glucose-Capillary: 178 mg/dL — ABNORMAL HIGH (ref 70–99)
Glucose-Capillary: 189 mg/dL — ABNORMAL HIGH (ref 70–99)
Glucose-Capillary: 211 mg/dL — ABNORMAL HIGH (ref 70–99)

## 2018-08-26 LAB — MAGNESIUM: Magnesium: 1.9 mg/dL (ref 1.7–2.4)

## 2018-08-26 LAB — PHOSPHORUS: Phosphorus: 5.6 mg/dL — ABNORMAL HIGH (ref 2.5–4.6)

## 2018-08-26 MED ORDER — TRAVASOL 10 % IV SOLN
INTRAVENOUS | Status: AC
  Administered 2018-08-26: 18:00:00 via INTRAVENOUS
  Filled 2018-08-26: qty 883.2

## 2018-08-26 MED ORDER — MAGNESIUM SULFATE IN D5W 1-5 GM/100ML-% IV SOLN
1.0000 g | Freq: Once | INTRAVENOUS | Status: AC
  Administered 2018-08-26: 1 g via INTRAVENOUS
  Filled 2018-08-26: qty 100

## 2018-08-26 NOTE — Progress Notes (Signed)
   Subjective/Chief Complaint: No complaints Denies abdominal pain Still with a large amount of NG output   Objective: Vital signs in last 24 hours: Temp:  [98.3 F (36.8 C)-99.5 F (37.5 C)] 98.7 F (37.1 C) (08/16 0554) Pulse Rate:  [78-88] 79 (08/16 0554) Resp:  [15-18] 15 (08/16 0554) BP: (130-146)/(61-64) 133/64 (08/16 0554) SpO2:  [100 %] 100 % (08/16 0554) Weight:  [64.9 kg] 64.9 kg (08/15 1209) Last BM Date: 08/21/18  Intake/Output from previous day: 08/15 0701 - 08/16 0700 In: 1636.6 [I.V.:1636.6] Out: 3150 [Urine:1000; Emesis/NG output:2150] Intake/Output this shift: Total I/O In: -  Out: 600 [Emesis/NG output:600]  Exam: Awake and alert Abdomen soft, NT, ostomy pink, stuff in the bag NG drainage much thinner  Lab Results:  No results for input(s): WBC, HGB, HCT, PLT in the last 72 hours. BMET Recent Labs    08/25/18 0313 08/26/18 0405  NA 139 142  K 3.9 4.6  CL 99 96*  CO2 29 31  GLUCOSE 110* 127*  BUN 29* 37*  CREATININE 0.82 0.92  CALCIUM 9.5 9.7   PT/INR No results for input(s): LABPROT, INR in the last 72 hours. ABG No results for input(s): PHART, HCO3 in the last 72 hours.  Invalid input(s): PCO2, PO2  Studies/Results: No results found.  Anti-infectives: Anti-infectives (From admission, onward)   None      Assessment/Plan: Postop SBO  Electrolytes and creatinine back to normal NG output till high  Continue NPO and NG  LOS: 4 days    Coralie Keens 08/26/2018

## 2018-08-26 NOTE — Plan of Care (Signed)
Patient lying in bed this morning; does not feel well and did not sleep well last night. Request pain medication for generalized discomfort. No other needs expressed. Will continue to monitor.

## 2018-08-26 NOTE — Progress Notes (Signed)
PHARMACY - ADULT TOTAL PARENTERAL NUTRITION CONSULT NOTE   Pharmacy Consult for TPN Indication: SBO  Patient Measurements:Height: 5\' 6"  (167.6 cm) Weight: 143 lb 3 oz (64.9 kg) IBW/kg (Calculated) : 59.3 TPN AdjBW (KG): 64.9 Weight: 70 kg  Insulin Requirements: on glimepiride 1 mg bid, metformin XR 1000 qday, rSSI. 15 units SSI/24 hrs; CBGs  129 - 178 after TPN with 25 units insulin hung Current Nutrition: NPO, TPN  IVF: dc'd 8/14  Central access: PAC  TPN start date: 8/12  ASSESSMENT                                                                                                          HPI: 68 y.o. F s/p colorectal surgery and diverting ileostomy on 08/03/18. PMH: arthritis, back pain, depression, DM, GERD, HTN, hypercholesterolemia, rectal cancer, thyroid disease, PVD   Pt re-admitted 8/11 w/dehydration.   Significant events: SBO on KUB 8/11 PM 8/14 KUB slight improvement of SBO 8/16 still large NGO  Today:    Glucose: on resistant SSI q6h; glimepiride 1 mg PO bid, metformin XR 1000 mg qday; 15 units SSI/24 hrs, CBGs 129-178 after TPN w/ 25 units hung  Electrolytes: Na was low on admission due to high output ostomy  - now WNL, K 4.6 ( goal >=4 w/SBO)  CoCa 10.74- slightly high, Phos 5.6- slightly high,  mag 1.9 (goal >=2 w/ SBO)   Renal: on Maxzide 75-50 qday; SCr 1.59>>0.92, BUN 74> 37 dehydrated on admission - now resolved, SCr was 0.88> 1.24 end of July; I/O - 1513.4; NGO 2150 ml/, stool 650 ml, UOP 1000 ml   LFTs: WNL 8/13  TGs: 86 8/13  Prealbumin: 14.2 8/13  NUTRITIONAL GOALS                                                                                             RD recs: 8/13  Kcal:  1800-2000 Protein:  80-90g Fluid:  2L/day  Custom TPN at goal rate of 80 ml/hr provides: - 88.32 g/day protein (46 g/L) - 57.6 g/day Lipid  (30 g/L) - 268.8  g/day Dextrose (14 %) -  1843 Kcal/day  PLAN  Now: mag 1 gm bolus  At 1800 today:  continue TPN at goal rate of 80 ml/hr.  Goal rate of 80 ml/hr provides 88 gm protein and ~ 1843 kcal for full support  Electrolytes in TPN: standard x increased Na, decreased Ca and phos, Cl:Ac ratio 1:1  TPN to contain standard multivitamins daily and trace elements on MWF only due to national shortage  Continue resistant SSI q6h and glimepiride and metformin  Increase insulin in TPN to 30 units insulin to TPN  TPN lab panels on Mondays & Thursdays.  F/u daily.  Eudelia Bunch, Pharm.D [phone] 08/26/2018 7:25 AM

## 2018-08-27 LAB — COMPREHENSIVE METABOLIC PANEL
ALT: 9 U/L (ref 0–44)
AST: 15 U/L (ref 15–41)
Albumin: 2.8 g/dL — ABNORMAL LOW (ref 3.5–5.0)
Alkaline Phosphatase: 69 U/L (ref 38–126)
Anion gap: 11 (ref 5–15)
BUN: 48 mg/dL — ABNORMAL HIGH (ref 8–23)
CO2: 33 mmol/L — ABNORMAL HIGH (ref 22–32)
Calcium: 9.5 mg/dL (ref 8.9–10.3)
Chloride: 98 mmol/L (ref 98–111)
Creatinine, Ser: 1.03 mg/dL — ABNORMAL HIGH (ref 0.44–1.00)
GFR calc Af Amer: >60 mL/min (ref >60)
GFR calc non Af Amer: 55 mL/min — ABNORMAL LOW (ref >60)
Glucose, Bld: 109 mg/dL — ABNORMAL HIGH (ref 70–99)
Potassium: 4.3 mmol/L (ref 3.5–5.1)
Sodium: 142 mmol/L (ref 135–145)
Total Bilirubin: 0.5 mg/dL (ref 0.3–1.2)
Total Protein: 6.1 g/dL — ABNORMAL LOW (ref 6.5–8.1)

## 2018-08-27 LAB — GLUCOSE, CAPILLARY
Glucose-Capillary: 139 mg/dL — ABNORMAL HIGH (ref 70–99)
Glucose-Capillary: 124 mg/dL — ABNORMAL HIGH (ref 70–99)
Glucose-Capillary: 124 mg/dL — ABNORMAL HIGH (ref 70–99)

## 2018-08-27 LAB — CBC
HCT: 27.9 % — ABNORMAL LOW (ref 36.0–46.0)
Hemoglobin: 8.7 g/dL — ABNORMAL LOW (ref 12.0–15.0)
MCH: 30.3 pg (ref 26.0–34.0)
MCHC: 31.2 g/dL (ref 30.0–36.0)
MCV: 97.2 fL (ref 80.0–100.0)
Platelets: 382 10*3/uL (ref 150–400)
RBC: 2.87 MIL/uL — ABNORMAL LOW (ref 3.87–5.11)
RDW: 13.6 % (ref 11.5–15.5)
WBC: 4.1 10*3/uL (ref 4.0–10.5)
nRBC: 0 % (ref 0.0–0.2)

## 2018-08-27 LAB — PREALBUMIN: Prealbumin: 16.9 mg/dL — ABNORMAL LOW (ref 18–38)

## 2018-08-27 LAB — DIFFERENTIAL
Abs Immature Granulocytes: 0.03 10*3/uL (ref 0.00–0.07)
Basophils Absolute: 0 10*3/uL (ref 0.0–0.1)
Basophils Relative: 1 %
Eosinophils Absolute: 0.2 10*3/uL (ref 0.0–0.5)
Eosinophils Relative: 4 %
Immature Granulocytes: 1 %
Lymphocytes Relative: 18 %
Lymphs Abs: 0.7 10*3/uL (ref 0.7–4.0)
Monocytes Absolute: 0.5 10*3/uL (ref 0.1–1.0)
Monocytes Relative: 12 %
Neutro Abs: 2.7 10*3/uL (ref 1.7–7.7)
Neutrophils Relative %: 64 %

## 2018-08-27 LAB — TRIGLYCERIDES: Triglycerides: 123 mg/dL (ref <150)

## 2018-08-27 LAB — MAGNESIUM: Magnesium: 2.1 mg/dL (ref 1.7–2.4)

## 2018-08-27 LAB — PHOSPHORUS: Phosphorus: 4.1 mg/dL (ref 2.5–4.6)

## 2018-08-27 MED ORDER — TRAVASOL 10 % IV SOLN
INTRAVENOUS | Status: AC
  Administered 2018-08-27: 18:00:00 via INTRAVENOUS
  Filled 2018-08-27: qty 883.2

## 2018-08-27 NOTE — Evaluation (Signed)
Physical Therapy Evaluation Patient Details Name: Nicole Sellers MRN: [id number] DOB: 1950/11/06 Today's Date: 08/27/2018   History of Present Illness  68 yo female admitted to [facility] on 8/12 with dehydration and fatigue, pt with recent hospitalization s/p lower anterior resection with diverting ileostomy 7/24. 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  Clinical Impression  Pt presents with LE weakness, mild balance deficits with history of hospital-related fall (suspect due to lines/leads), increased time and effort to perform mobility tasks, and decreased activity tolerance. Pt to benefit from acute PT to address deficits. Pt ambulated hallway distance with RW with min guard assist. PT recommended pt ambulate halls at least 3x/day with nursing. PT suspects pt to progress well for safe d/c home without PT follow-up. PT to progress mobility as tolerated, and will continue to follow acutely.      Follow Up Recommendations Home health PT    Equipment Recommendations  3in1 (PT)    Recommendations for Other Services       Precautions / Restrictions Precautions Precautions: Fall Precaution Comments: ileostomy Restrictions Weight Bearing Restrictions: No      Mobility  Bed Mobility Overal bed mobility: Needs Assistance Bed Mobility: Supine to Sit     Supine to sit: Supervision;HOB elevated     General bed mobility comments: Supervision for safety, increased time and effort to perform with use of bed rails.  Transfers Overall transfer level: Needs assistance Equipment used: Rolling walker (2 wheeled) Transfers: Sit to/from Stand Sit to Stand: Min guard;Min assist         General transfer comment: Min guard for safety from bed with increased time to rise, min assist standing from toilet due to low  surface  Ambulation/Gait Ambulation/Gait assistance: Min guard Gait Distance (Feet): 200 Feet Assistive device: Rolling walker (2 wheeled) Gait Pattern/deviations: Step-through pattern;Decreased stride length;Trunk flexed Gait velocity: decr   General Gait Details: Min guard for safety, verbal cuing for placement in RW.  Stairs            Wheelchair Mobility    Modified Rankin (Stroke Patients Only)       Balance Overall balance assessment: Needs assistance Sitting-balance support: No upper extremity supported;Feet supported Sitting balance-Leahy Scale: Fair     Standing balance support: Bilateral upper extremity supported Standing balance-Leahy Scale: Fair Standing balance comment: Able to statically stand without upper extremity support, i.e. when washing hands at sink                             Pertinent Vitals/Pain Pain Assessment: No/denies pain Pain Intervention(s): Limited activity within patient's tolerance;Monitored during session    [place] expects to be discharged to:: Private residence Living Arrangements: Alone Available Help at Discharge: Family;Available PRN/intermittently(pt reports son can help sometimes) Type of Home: Apartment Home Access: Level entry     Home Layout: One level Home Equipment: Shower seat;Cane - single point;Walker - 4 wheels      Prior Function Level of Independence: Independent with assistive device(s)         Comments: pt reports using cane for ambulation PTA, states she uses rollator for out of apartment mobility. Pt does not drive     Hand Dominance   Dominant Hand: Right    Extremity/Trunk Assessment   Upper Extremity Assessment Upper Extremity Assessment: Generalized weakness;LUE deficits/detail;RUE deficits/detail RUE Sensation: history  of peripheral neuropathy LUE Sensation: history of peripheral neuropathy    Lower Extremity Assessment Lower Extremity Assessment: LLE  deficits/detail;RLE deficits/detail RLE Deficits / Details: MMT symmetrical R and L: hip flexion 3+/5, hip abduction 4/5, hip adduction 3+/5, knee extension 4/5, knee flexion 4/5, DF/PF AROM WFL. RLE Sensation: history of peripheral neuropathy LLE Deficits / Details: refer above LLE Sensation: history of peripheral neuropathy       Communication   Communication: No difficulties  Cognition Arousal/Alertness: Awake/alert Behavior During Therapy: WFL for tasks assessed/performed Overall Cognitive Status: Within Functional Limits for tasks assessed                                        General Comments      Exercises General Exercises - Lower Extremity Hip ABduction/ADduction: AROM;Both;10 reps;Seated Straight Leg Raises: AROM;Both;5 reps;Seated   Assessment/Plan    PT Assessment Patient needs continued PT services  PT Problem List Decreased strength;Decreased balance;Decreased activity tolerance;Decreased mobility       PT Treatment Interventions Therapeutic exercise;Gait training;DME instruction;Balance training;Functional mobility training;Therapeutic activities    PT Goals (Current goals can be found in the Care Plan section)  Acute Rehab PT Goals Patient Stated Goal: return to independence PT Goal Formulation: With patient Time For Goal Achievement: 09/10/18 Potential to Achieve Goals: Good    Frequency Min 3X/week   Barriers to discharge        Co-evaluation               AM-PAC PT "6 Clicks" Mobility  Outcome Measure Help needed turning from your back to your side while in a flat bed without using bedrails?: A Little Help needed moving from lying on your back to sitting on the side of a flat bed without using bedrails?: A Little Help needed moving to and from a bed to a chair (including a wheelchair)?: A Little Help needed standing up from a chair using your arms (e.g., wheelchair or bedside chair)?: A Little Help needed to walk in  hospital room?: A Little Help needed climbing 3-5 steps with a railing? : A Little 6 Click Score: 18    End of Session Equipment Utilized During Treatment: Gait belt Activity Tolerance: Patient tolerated treatment well Patient left: in chair;with call bell/phone within reach;with chair alarm set Nurse Communication: Mobility status PT Visit Diagnosis: Difficulty in walking, not elsewhere classified (R26.2)    Time: 1201-1229 PT Time Calculation (min) (ACUTE ONLY): 28 min   Charges:   PT Evaluation $PT Eval Low Complexity: 1 Low PT Treatments $Gait Training: 8-22 mins        Julien Girt, PT Acute Rehabilitation Services Pager [phone]  Office [phone]   Saudia Smyser D Elonda Husky 08/27/2018, 12:55 PM

## 2018-08-27 NOTE — Progress Notes (Signed)
  Subjective: Pt feels about the same.  NG output going down slowly, having some ostomy output  Objective: Vital signs in last 24 hours: Temp:  [97.9 F (36.6 C)-98.8 F (37.1 C)] 98.8 F (37.1 C) (08/17 0610) Pulse Rate:  [76-87] 85 (08/17 0610) Resp:  [15-19] 16 (08/17 0610) BP: (136-154)/(67-71) 148/67 (08/17 0610) SpO2:  [97 %-100 %] 97 % (08/17 0610) Weight:  [63.8 kg] 63.8 kg (08/17 0610)   Intake/Output from previous day: 08/16 0701 - 08/17 0700 In: 2283.1 [I.V.:2187.6; IV Piggyback:95.5] Out: 2339 [Urine:700; Emesis/NG output:1639] Intake/Output this shift: No intake/output data recorded.   General appearance: alert and cooperative GI: normal findings: soft, non-tender and distended Ostomy: beefy red, stool in bag Incision: no significant drainage, no significant erythema  Lab Results:  No results for input(s): WBC, HGB, HCT, PLT in the last 72 hours. BMET Recent Labs    08/25/18 0313 08/26/18 0405  NA 139 142  K 3.9 4.6  CL 99 96*  CO2 29 31  GLUCOSE 110* 127*  BUN 29* 37*  CREATININE 0.82 0.92  CALCIUM 9.5 9.7   PT/INR No results for input(s): LABPROT, INR in the last 72 hours. ABG No results for input(s): PHART, HCO3 in the last 72 hours.  Invalid input(s): PCO2, PO2  MEDS, Scheduled . amLODipine  5 mg Oral Daily  . aspirin  81 mg Oral Daily  . atorvastatin  10 mg Oral q1800  . clopidogrel  75 mg Oral Daily  . enoxaparin (LOVENOX) injection  40 mg Subcutaneous Q24H  . gabapentin  300 mg Oral BID  . glimepiride  1 mg Oral BID WC  . insulin aspart  0-20 Units Subcutaneous Q6H  . levothyroxine  50 mcg Oral Daily  . metFORMIN  1,000 mg Oral Q breakfast  . triamterene-hydrochlorothiazide  1 tablet Oral Daily    Studies/Results: No results found.  Assessment: s/p  Patient Active Problem List   Diagnosis Date Noted  . SBO (small bowel obstruction) ([place]) 08/22/2018  . Dehydration 08/21/2018  . Pre-operative cardiovascular examination  06/07/2018  . Rectal cancer ([place]) 11/06/2017  . Claudication in peripheral vascular disease ([place]) 05/08/2017  . Carotid artery disease ([place]) 04/14/2017  . PAD (peripheral artery disease) Rt ABI 0.7, Lt ABI 0.46 12/25/2012  . S/P arterial stent, 12/25/13, successful diamondback orbital rotational arthrectomy, PTA using chocolate  balloon and stenting using I DEV stent of long segment calcified high-grade proximal and mid r 12/25/2012  . Gangrene of toe, Rt second toe 12/25/2012  . Essential hypertension 12/13/2012  . Non-insulin treated type 2 diabetes mellitus ([place]) 12/13/2012  . Critical lower limb ischemia 12/13/2012  . Tobacco abuse 12/13/2012    Pt with early post op bowel obstruction.   Plan: NPO, ok for sips of clears, pop sickles and ice Hyponatremia: due to high output ostomy and bowel obstruction. This is better with replacements and hydration. Cont NG TPN  LOS: 5 days     .Rosario Adie, [facility], [place]   08/27/2018 8:15 AM

## 2018-08-27 NOTE — Progress Notes (Signed)
PHARMACY - ADULT TOTAL PARENTERAL NUTRITION CONSULT NOTE   Pharmacy Consult for TPN Indication: SBO  Patient Measurements:Height: 5\' 6"  (167.6 cm) Weight: 140 lb 11.2 oz (63.8 kg) IBW/kg (Calculated) : 59.3 TPN AdjBW (KG): 64.9 Weight: 70 kg  Insulin Requirements: on glimepiride 1 mg bid, metformin XR 1000 qday, rSSI. 18 units SSI/24 hrs; CBGs  113-211 after TPN with 30 units insulin/bag  Current Nutrition: NPO, TPN  IVF: dc'd 8/14  Central access: PAC  TPN start date: 8/12  ASSESSMENT                                                                                                          HPI: 68 y.o. F s/p colorectal surgery and diverting ileostomy on 08/03/18. PMH: arthritis, back pain, depression, DM, GERD, HTN, hypercholesterolemia, rectal cancer, thyroid disease, PVD   Pt re-admitted 8/11 w/dehydration.   Significant events: SBO on KUB 8/11 PM 8/14 KUB slight improvement of SBO 8/16 still large NGO  Today:    Glucose: this am CBG 139  Electrolytes: Na was low on admission due to high output ostomy  - now WNL, K 4.3 ( goal >=4 w/SBO)  CoCa 10.3- slightly high, Phos 4.1- slightly high,  mag 2.1 (goal >=2 w/ SBO)   Renal: on Maxzide 75-50 qday; SCr 1.59>>0.92, BUN 74> 37 dehydrated on admission - now resolved, SCr was 0.88> 1.24 end of July; I/O - 1220 ml; NGO 1950 ml/, some ostomy output, UOP 1000 ml   LFTs: WNL  TGs: 86 (8/13), 123 (8/17)  Prealbumin: 14.2 (8/13), 16.9 (8/17)  NUTRITIONAL GOALS                                                                                             RD recs: 8/13  Kcal:  1800-2000 Protein:  80-90g Fluid:  2L/day  Custom TPN at goal rate of 80 ml/hr provides: - 88.32 g/day protein (46 g/L) - 57.6 g/day Lipid  (30 g/L) - 268.8  g/day Dextrose (14 %) -  1843 Kcal/day  PLAN                                                                                                            Now: mag 1 gm  bolus  At 1800  today:  continue TPN at goal rate of 80 ml/hr.  Goal rate of 80 ml/hr provides 88 gm protein and ~ 1843 kcal for full support  Electrolytes in TPN: standard x increased Na, decreased Ca and phos, Cl:Ac ratio 1:1  TPN to contain standard multivitamins daily and trace elements on MWF only due to national shortage  Continue resistant SSI q6h and glimepiride and metformin  Continue Insulin in TPN at 30 units per 24 hr  TPN lab panels on Mondays & Thursdays.  BMET in am  F/u daily.  Minda Ditto PharmD Pager [phone] 08/27/2018, 11:30 AM

## 2018-08-27 NOTE — Care Management Important Message (Signed)
Important Message  Patient Details IM Letter given to [name] SW to present to the Patient Name: Nicole Sellers MRN: [id number] Date of Birth: 1950-03-28   Medicare Important Message Given:  Yes     Kerin Salen 08/27/2018, 10:17 AM

## 2018-08-28 ENCOUNTER — Inpatient Hospital Stay (HOSPITAL_COMMUNITY): Payer: Medicare Other

## 2018-08-28 LAB — BASIC METABOLIC PANEL
Anion gap: 10 (ref 5–15)
BUN: 49 mg/dL — ABNORMAL HIGH (ref 8–23)
CO2: 29 mmol/L (ref 22–32)
Calcium: 9.2 mg/dL (ref 8.9–10.3)
Chloride: 101 mmol/L (ref 98–111)
Creatinine, Ser: 1.06 mg/dL — ABNORMAL HIGH (ref 0.44–1.00)
GFR calc Af Amer: >60 mL/min (ref >60)
GFR calc non Af Amer: 53 mL/min — ABNORMAL LOW (ref >60)
Glucose, Bld: 145 mg/dL — ABNORMAL HIGH (ref 70–99)
Potassium: 3.8 mmol/L (ref 3.5–5.1)
Sodium: 140 mmol/L (ref 135–145)

## 2018-08-28 LAB — GLUCOSE, CAPILLARY
Glucose-Capillary: 159 mg/dL — ABNORMAL HIGH (ref 70–99)
Glucose-Capillary: 132 mg/dL — ABNORMAL HIGH (ref 70–99)
Glucose-Capillary: 124 mg/dL — ABNORMAL HIGH (ref 70–99)
Glucose-Capillary: 179 mg/dL — ABNORMAL HIGH (ref 70–99)
Glucose-Capillary: 153 mg/dL — ABNORMAL HIGH (ref 70–99)

## 2018-08-28 MED ORDER — TRAVASOL 10 % IV SOLN
INTRAVENOUS | Status: AC
  Administered 2018-08-28: 17:00:00 via INTRAVENOUS
  Filled 2018-08-28: qty 883.2

## 2018-08-28 NOTE — Progress Notes (Signed)
Physical Therapy Treatment Patient Details Name: Nicole Sellers MRN: [id number] DOB: [birth year] Today's Date: 08/28/2018    History of Present Illness 68 yo female admitted to [facility] on 8/12 with dehydration and fatigue, pt with recent hospitalization s/p lower anterior resection with diverting ileostomy 7/24. 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    PT Comments    Pt making good progress.  Encouraged pt to ambulate with nursing staff throughout the day.   Follow Up Recommendations  Home health PT     Equipment Recommendations  3in1 (PT)    Recommendations for Other Services       Precautions / Restrictions Precautions Precautions: Fall Precaution Comments: ileostomy Restrictions Weight Bearing Restrictions: No    Mobility  Bed Mobility Overal bed mobility: Needs Assistance Bed Mobility: Supine to Sit     Supine to sit: Supervision;HOB elevated        Transfers Overall transfer level: Needs assistance Equipment used: Rolling walker (2 wheeled) Transfers: Sit to/from Stand Sit to Stand: Min guard         General transfer comment: min/guard from bed and toilet  Ambulation/Gait Ambulation/Gait assistance: Min guard Gait Distance (Feet): 250 Feet Assistive device: Rolling walker (2 wheeled) Gait Pattern/deviations: Step-through pattern;Trunk flexed     General Gait Details: min/guard with 2 short standing rest breaks. cues for posture   Stairs             Wheelchair Mobility    Modified Rankin (Stroke Patients Only)       Balance                                            Cognition Arousal/Alertness: Awake/alert Behavior During Therapy: WFL for tasks assessed/performed Overall Cognitive Status: Within Functional Limits for tasks assessed                                        Exercises      General Comments        Pertinent Vitals/Pain Pain Assessment: No/denies pain    Home Living                      Prior Function            PT Goals (current goals can now be found in the care plan section) Acute Rehab PT Goals PT Goal Formulation: With patient Time For Goal Achievement: 09/10/18 Potential to Achieve Goals: Good Progress towards PT goals: Progressing toward goals    Frequency    Min 3X/week      PT Plan Current plan remains appropriate    Co-evaluation              AM-PAC PT "6 Clicks" Mobility   Outcome Measure  Help needed turning from your back to your side while in a flat bed without using bedrails?: A Little Help needed moving from lying on your back to sitting on the side of a flat bed without using bedrails?: A Little Help needed moving to and from a bed to a chair (including a wheelchair)?: A Little Help needed standing up from a chair using your arms (e.g., wheelchair or bedside chair)?:  A Little Help needed to walk in hospital room?: A Little Help needed climbing 3-5 steps with a railing? : A Little 6 Click Score: 18    End of Session   Activity Tolerance: Patient tolerated treatment well Patient left: in bed;with call bell/phone within reach(sitting EOB)   PT Visit Diagnosis: Difficulty in walking, not elsewhere classified (R26.2)     Time: 1783-7542 PT Time Calculation (min) (ACUTE ONLY): 15 min  Charges:  $Gait Training: 8-22 mins                     Dmiyah Liscano L. Tamala Julian, [place] Pager [phone] 08/28/2018    [name] 08/28/2018, 1:47 PM

## 2018-08-28 NOTE — Progress Notes (Signed)
PHARMACY - ADULT TOTAL PARENTERAL NUTRITION CONSULT NOTE   Pharmacy Consult for TPN Indication: SBO  Patient Measurements:Height: 5\' 6"  (167.6 cm) Weight: 140 lb 11.2 oz (63.8 kg) IBW/kg (Calculated) : 59.3 TPN AdjBW (KG): 64.9 Weight: 70 kg  Insulin Requirements: on glimepiride 1 mg bid, metformin XR 1000 qday, rSSI. 16 units SSI/24 hrs; CBGs  124-145 after TPN with 30 units insulin/bag  Current Nutrition: NPO, TPN  IVF: dc'd 8/14  Central access: PAC  TPN start date: 8/12  ASSESSMENT                                                                                                          HPI: 79 yoF s/p colorectal surgery and diverting ileostomy on 08/03/18. PMH: arthritis, back pain, depression, DM, GERD, HTN, hypercholesterolemia, rectal cancer, thyroid disease, PVD   Pt re-admitted 8/11 w/dehydration.   Significant events: SBO on KUB 8/11 PM 8/14 KUB slight improvement of SBO 8/16 still large NG output 8/17: begin sips clears, ice  Today:    Glucose: this am CBG 159  Electrolytes: Na was low on admission due to high output ostomy  - cont WNL, K 3.8 ( goal >=4 w/SBO)  CoCa 10, Phos 4.1,  mag 2.1 (goal >=2 w/ SBO)   Renal: on Maxzide 75-50 qday; SCr 1.59>>1.06, BUN 74> 49 dehydrated on admission - now resolved, SCr was 0.88> 1.24 end of July; I/O -  ml; NGO 2L , 200 ml ostomy output, UOP 1050 ml   LFTs: WNL  TGs: 86 (8/13), 123 (8/17)  Prealbumin: 14.2 (8/13), 16.9 (8/17)  NUTRITIONAL GOALS                                                                   RD recs: 8/13  Kcal:  1800-2000 Protein:  80-90g Fluid:  2L/day  Custom TPN at goal rate of 80 ml/hr provides: - 88.32 g/day protein (46 g/L) - 57.6 g/day Lipid  (30 g/L) - 268.8  g/day Dextrose (14 %) -  1843 Kcal/day  PLAN                                                                                                At 1800 today:  continue TPN at goal rate of 80 ml/hr.  Goal rate of 80 ml/hr  provides 88 gm protein and ~ 1843 kcal for full support  Electrolytes in TPN: standard x increased Na & K,  decreased Ca and phos, Cl:Ac ratio 1:1  TPN to contain standard multivitamins daily and trace elements on MWF only due to national shortage  Continue resistant SSI q6h and glimepiride and metformin  Continue Insulin in TPN at 30 units per 24 hr  TPN lab panels on Mondays & Thursdays.  BMET, Mag & Phos in am  F/u daily.  Minda Ditto PharmD Pager [phone] 08/28/2018, 8:14 AM

## 2018-08-28 NOTE — Progress Notes (Signed)
  Subjective: Pt feels about the same.  NG output still high, having some ostomy output  Objective: Vital signs in last 24 hours: Temp:  [97.6 F (36.4 C)-99.3 F (37.4 C)] 97.6 F (36.4 C) (08/18 0454) Pulse Rate:  [80-89] 89 (08/18 0638) Resp:  [14-18] 18 (08/18 0638) BP: (120-132)/(65-81) 132/71 (08/18 0638) SpO2:  [99 %-100 %] 99 % (08/18 0638)   Intake/Output from previous day: 08/17 0701 - 08/18 0700 In: 2369.5 [P.O.:180; I.V.:2069.5; NG/GT:120] Out: 3750 [Urine:1250; Emesis/NG output:2100; Stool:400] Intake/Output this shift: No intake/output data recorded.   General appearance: alert and cooperative GI: normal findings: soft, non-tender and distended Ostomy: beefy red, stool in bag Incision: no significant drainage, no significant erythema  Lab Results:  Recent Labs    08/27/18 0929  WBC 4.1  HGB 8.7*  HCT 27.9*  PLT 382   BMET Recent Labs    08/27/18 0929 08/28/18 0317  NA 142 140  K 4.3 3.8  CL 98 101  CO2 33* 29  GLUCOSE 109* 145*  BUN 48* 49*  CREATININE 1.03* 1.06*  CALCIUM 9.5 9.2   PT/INR No results for input(s): LABPROT, INR in the last 72 hours. ABG No results for input(s): PHART, HCO3 in the last 72 hours.  Invalid input(s): PCO2, PO2  MEDS, Scheduled . amLODipine  5 mg Oral Daily  . aspirin  81 mg Oral Daily  . atorvastatin  10 mg Oral q1800  . clopidogrel  75 mg Oral Daily  . enoxaparin (LOVENOX) injection  40 mg Subcutaneous Q24H  . gabapentin  300 mg Oral BID  . glimepiride  1 mg Oral BID WC  . insulin aspart  0-20 Units Subcutaneous Q6H  . levothyroxine  50 mcg Oral Daily  . metFORMIN  1,000 mg Oral Q breakfast  . triamterene-hydrochlorothiazide  1 tablet Oral Daily    Studies/Results: No results found.  Assessment: s/p  Patient Active Problem List   Diagnosis Date Noted  . SBO (small bowel obstruction) ([place]) 08/22/2018  . Dehydration 08/21/2018  . Pre-operative cardiovascular examination 06/07/2018  . Rectal  cancer ([place]) 11/06/2017  . Claudication in peripheral vascular disease ([place]) 05/08/2017  . Carotid artery disease ([place]) 04/14/2017  . PAD (peripheral artery disease) Rt ABI 0.7, Lt ABI 0.46 12/25/2012  . S/P arterial stent, 12/25/13, successful diamondback orbital rotational arthrectomy, PTA using chocolate  balloon and stenting using I DEV stent of long segment calcified high-grade proximal and mid r 12/25/2012  . Gangrene of toe, Rt second toe 12/25/2012  . Essential hypertension 12/13/2012  . Non-insulin treated type 2 diabetes mellitus ([place]) 12/13/2012  . Critical lower limb ischemia 12/13/2012  . Tobacco abuse 12/13/2012    Pt with early post op bowel obstruction.   Plan: NPO, ok for sips of clears, pop sickles and ice Hyponatremia: due to high output ostomy and bowel obstruction. This is resolved with replacements and hydration. Cont NG TPN Ambulate  LOS: 6 days     .Rosario Adie, MD [facility], [place]   08/28/2018 8:18 AM

## 2018-08-29 LAB — GLUCOSE, CAPILLARY
Glucose-Capillary: 138 mg/dL — ABNORMAL HIGH (ref 70–99)
Glucose-Capillary: 147 mg/dL — ABNORMAL HIGH (ref 70–99)
Glucose-Capillary: 166 mg/dL — ABNORMAL HIGH (ref 70–99)
Glucose-Capillary: 215 mg/dL — ABNORMAL HIGH (ref 70–99)

## 2018-08-29 LAB — BASIC METABOLIC PANEL
Anion gap: 8 (ref 5–15)
BUN: 58 mg/dL — ABNORMAL HIGH (ref 8–23)
CO2: 29 mmol/L (ref 22–32)
Calcium: 9.4 mg/dL (ref 8.9–10.3)
Chloride: 102 mmol/L (ref 98–111)
Creatinine, Ser: 1.21 mg/dL — ABNORMAL HIGH (ref 0.44–1.00)
GFR calc Af Amer: 52 mL/min — ABNORMAL LOW (ref >60)
GFR calc non Af Amer: 45 mL/min — ABNORMAL LOW (ref >60)
Glucose, Bld: 171 mg/dL — ABNORMAL HIGH (ref 70–99)
Potassium: 4 mmol/L (ref 3.5–5.1)
Sodium: 139 mmol/L (ref 135–145)

## 2018-08-29 LAB — PHOSPHORUS: Phosphorus: 3.3 mg/dL (ref 2.5–4.6)

## 2018-08-29 LAB — MAGNESIUM: Magnesium: 2.2 mg/dL (ref 1.7–2.4)

## 2018-08-29 MED ORDER — TRAVASOL 10 % IV SOLN
INTRAVENOUS | Status: AC
  Administered 2018-08-29: 17:00:00 via INTRAVENOUS
  Filled 2018-08-29: qty 883.2

## 2018-08-29 NOTE — Progress Notes (Signed)
Subjective: Pt feels about the same.  NG output high but clear, having some ostomy output  Objective: Vital signs in last 24 hours: Temp:  [98.1 F (36.7 C)-98.5 F (36.9 C)] 98.1 F (36.7 C) (08/19 1331) Pulse Rate:  [81-84] 84 (08/19 1331) Resp:  [17-18] 17 (08/19 1331) BP: (135-138)/(60-123) 135/123 (08/19 1331) SpO2:  [97 %-100 %] 100 % (08/19 1331)   Intake/Output from previous day: 08/18 0701 - 08/19 0700 In: 1949.2 [P.O.:720; I.V.:1169.2; NG/GT:60] Out: 3350 [Urine:1000; Emesis/NG output:1900; Stool:450] Intake/Output this shift: Total I/O In: -  Out: 1000 [Urine:700; Emesis/NG output:300]   General appearance: alert and cooperative GI: normal findings: soft, non-tender and distended Ostomy: beefy red, stool in bag Incision: no significant drainage, no significant erythema  Lab Results:  Recent Labs    08/27/18 0929  WBC 4.1  HGB 8.7*  HCT 27.9*  PLT 382   BMET Recent Labs    08/28/18 0317 08/29/18 0530  NA 140 139  K 3.8 4.0  CL 101 102  CO2 29 29  GLUCOSE 145* 171*  BUN 49* 58*  CREATININE 1.06* 1.21*  CALCIUM 9.2 9.4   PT/INR No results for input(s): LABPROT, INR in the last 72 hours. ABG No results for input(s): PHART, HCO3 in the last 72 hours.  Invalid input(s): PCO2, PO2  MEDS, Scheduled . amLODipine  5 mg Oral Daily  . aspirin  81 mg Oral Daily  . atorvastatin  10 mg Oral q1800  . clopidogrel  75 mg Oral Daily  . enoxaparin (LOVENOX) injection  40 mg Subcutaneous Q24H  . gabapentin  300 mg Oral BID  . glimepiride  1 mg Oral BID WC  . insulin aspart  0-20 Units Subcutaneous Q6H  . levothyroxine  50 mcg Oral Daily  . metFORMIN  1,000 mg Oral Q breakfast  . triamterene-hydrochlorothiazide  1 tablet Oral Daily    Studies/Results: Dg Abd Portable 1v  Result Date: 08/28/2018 CLINICAL DATA:  Check gastric catheter placement, follow up small bowel obstruction EXAM: PORTABLE ABDOMEN - 1 VIEW COMPARISON:  08/24/2018 FINDINGS:  Ostomy is noted in the right mid abdomen. Previously seen red rubber catheter has been removed in the interval. Gastric catheter is noted within the stomach. The previously seen small bowel dilatation has resolved. No free air is noted. IMPRESSION: Resolution of previously seen small bowel dilatation. Electronically Signed   By: Inez Catalina M.D.   On: 08/28/2018 09:34    Assessment: s/p  Patient Active Problem List   Diagnosis Date Noted  . SBO (small bowel obstruction) ([place]) 08/22/2018  . Dehydration 08/21/2018  . Pre-operative cardiovascular examination 06/07/2018  . Rectal cancer ([place]) 11/06/2017  . Claudication in peripheral vascular disease ([place]) 05/08/2017  . Carotid artery disease ([place]) 04/14/2017  . PAD (peripheral artery disease) Rt ABI 0.7, Lt ABI 0.46 12/25/2012  . S/P arterial stent, 12/25/13, successful diamondback orbital rotational arthrectomy, PTA using chocolate  balloon and stenting using I DEV stent of long segment calcified high-grade proximal and mid r 12/25/2012  . Gangrene of toe, Rt second toe 12/25/2012  . Essential hypertension 12/13/2012  . Non-insulin treated type 2 diabetes mellitus ([place]) 12/13/2012  . Critical lower limb ischemia 12/13/2012  . Tobacco abuse 12/13/2012    Pt with early post op bowel obstruction.   Plan:  ok for clears Cont NG clamped today, if no symptoms by this evening, will d/c ng Cont TPN Ambulate  LOS: 7 days     .Rosario Adie, MD [facility] [place] [phone]   08/29/2018 3:58 PM

## 2018-08-29 NOTE — Progress Notes (Signed)
Physical Therapy Treatment Patient Details Name: Nicole Sellers MRN: [id number] DOB: Jun 07, 1950 Today's Date: 08/29/2018    History of Present Illness 68 yo female admitted to [facility] on 8/12 with dehydration and fatigue, pt with recent hospitalization s/p lower anterior resection with diverting ileostomy 7/24. 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    PT Comments    Pt in bed with NG tube clamped drinking her lunch.  Assisted OOB.  General bed mobility comments: Supervision for safety, increased time and effort to perform with use of bed rails.General transfer comment: min/guard from bed to [facility] General Gait Details: SPC R UE and IV Pole L UE therapist assist for balance.  Mod unsteady.  Not yet ready to use cane.  Unsteady. Progressing well.  No nausea during session.   Follow Up Recommendations  Home health PT     Equipment Recommendations  3in1 (PT)    Recommendations for Other Services       Precautions / Restrictions Precautions Precautions: Fall Precaution Comments: ileostomy, NG tube Restrictions Weight Bearing Restrictions: No    Mobility  Bed Mobility Overal bed mobility: Needs Assistance Bed Mobility: Supine to Sit Rolling: Supervision         General bed mobility comments: Supervision for safety, increased time and effort to perform with use of bed rails.  Transfers Overall transfer level: Needs assistance Equipment used: Straight cane(SPC R/IV Pole L) Transfers: Sit to/from Stand Sit to Stand: Min guard;Supervision Stand pivot transfers: Min guard       General transfer comment: min/guard from bed to [facility]  Ambulation/Gait Ambulation/Gait assistance: Min assist Gait Distance (Feet): 225 Feet Assistive device: Straight cane Gait Pattern/deviations: Step-to pattern;Decreased step length - right;Decreased  step length - left Gait velocity: decreased   General Gait Details: SPC R UE and IV Pole L UE therapist assist for balance.  Mod unsteady.  Not yet ready to use cane.  Unsteady.   Stairs             Wheelchair Mobility    Modified Rankin (Stroke Patients Only)       Balance                                            Cognition Arousal/Alertness: Awake/alert Behavior During Therapy: WFL for tasks assessed/performed Overall Cognitive Status: Within Functional Limits for tasks assessed                                        Exercises      General Comments        Pertinent Vitals/Pain Pain Assessment: No/denies pain    Home Living                      Prior Function            PT Goals (current goals can now be found in the care plan section) Progress towards PT goals: Progressing toward goals    Frequency    Min 3X/week      PT Plan Current plan remains appropriate    Co-evaluation              AM-PAC PT "6  Clicks" Mobility   Outcome Measure  Help needed turning from your back to your side while in a flat bed without using bedrails?: A Little Help needed moving from lying on your back to sitting on the side of a flat bed without using bedrails?: A Little Help needed moving to and from a bed to a chair (including a wheelchair)?: A Little Help needed standing up from a chair using your arms (e.g., wheelchair or bedside chair)?: A Little Help needed to walk in hospital room?: A Little Help needed climbing 3-5 steps with a railing? : A Little 6 Click Score: 18    End of Session Equipment Utilized During Treatment: Gait belt Activity Tolerance: Patient tolerated treatment well Patient left: in chair;with chair alarm set;with call bell/phone within reach Nurse Communication: Mobility status PT Visit Diagnosis: Difficulty in walking, not elsewhere classified (R26.2)     Time: 1610-9604 PT Time  Calculation (min) (ACUTE ONLY): 26 min  Charges:  $Gait Training: 8-22 mins $Therapeutic Activity: 8-22 mins                     Rica Koyanagi  PTA Acute  Rehabilitation Services Pager      [phone] Office      [phone]

## 2018-08-29 NOTE — Progress Notes (Signed)
Nutrition Follow-up  INTERVENTION:   TPN per Pharmacy Diet advancement per MD  NUTRITION DIAGNOSIS:   Inadequate oral intake related to inability to eat(SBO) as evidenced by NPO status.  Now on clears.  GOAL:   Patient will meet greater than or equal to 90% of their needs  Meeting with TPN.  MONITOR:   Diet advancement, Labs, Weight trends, I & O's(TPN)  ASSESSMENT:   68 y.o. F s/p colorectal surgery and diverting ileostomy. Admitted for dehydration and SBO.  **RD working remotely**  Patient now on clear liquids starting today. NGT clamped. Pt is continuing to receive TPN at 80 ml/hr (providing 1843 kcal, 88g protein).  Will consider nutritional supplements once diet is advanced.  Admission weight: 143 lbs. Weight as of 8/17: 140 lbs.  I/Os: -4.9L since admit.  Medications reviewed. Labs reviewed: CBGs: 147-166 GFR: 52  Diet Order:   Diet Order            Diet clear liquid Room service appropriate? Yes; Fluid consistency: Thin  Diet effective now              EDUCATION NEEDS:   No education needs have been identified at this time  Skin:  Skin Assessment: Reviewed RN Assessment  Last BM:  8/18  Height:   Ht Readings from Last 1 Encounters:  08/25/18 5\' 6"  (1.676 m)    Weight:   Wt Readings from Last 1 Encounters:  08/27/18 63.8 kg    Ideal Body Weight:  59.1 kg  BMI:  Body mass index is 22.71 kg/m.  Estimated Nutritional Needs:   Kcal:  1800-2000  Protein:  80-90g  Fluid:  2L/day  Clayton Bibles, MS, RD, LDN [place] Dietitian Pager: [phone] After Hours Pager: [phone]

## 2018-08-29 NOTE — Progress Notes (Signed)
PHARMACY - ADULT TOTAL PARENTERAL NUTRITION CONSULT NOTE   Pharmacy Consult for TPN Indication: SBO  Patient Measurements:Height: 5\' 6"  (167.6 cm) Weight: 140 lb 11.2 oz (63.8 kg) IBW/kg (Calculated) : 59.3 TPN AdjBW (KG): 64.9 Weight: 70 kg  Insulin Requirements: on glimepiride 1 mg bid, metformin XR 1000 qday, rSSI. 19 units SSI/24 hrs; CBGs  124-166 after TPN with 30 units insulin/bag  Current Nutrition: NPO, TPN  IVF: dc'd 8/14  Central access: PAC  TPN start date: 8/12  ASSESSMENT                                                                                                          HPI: 68 yoF s/p colorectal surgery and diverting ileostomy on 08/03/18. PMH: arthritis, back pain, depression, DM, GERD, HTN, hypercholesterolemia, rectal cancer, thyroid disease, PVD   Pt re-admitted 8/11 w/dehydration.   Significant events: SBO on KUB 8/11 PM 8/14 KUB slight improvement of SBO 8/16 still large NG output 8/17: begin sips clears, ice  Today:    Glucose: this am CBG 166  Electrolytes: Na was low on admission due to high output ostomy  - cont WNL, K 4 ( goal >/=4 w/SBO)  CoCa 10, Phos 3.3,  mag 2.2 (goal >=2 w/ SBO)   Renal: on Maxzide 75-50 qday; SCr 1.59>>1.21, BUN 74> 58 dehydrated on admission - now resolved, (SCr was 0.88> 1.24 end of July); I/O - 1.5L; NGO 2.4L; 550 ml ostomy output, UOP 1L   LFTs: WNL  TGs: 86 (8/13), 123 (8/17)  Prealbumin: 14.2 (8/13), 16.9 (8/17)  NUTRITIONAL GOALS                                                                   RD recs: 8/13  Kcal:  1800-2000 Protein:  80-90g Fluid:  2L/day  Custom TPN at goal rate of 80 ml/hr provides: - 88.32 g/day protein (46 g/L) - 57.6 g/day Lipid  (30 g/L) - 268.8  g/day Dextrose (14 %) -  1843 Kcal/day  PLAN                                                                                                At 1800 today:  continue TPN at goal rate of 80 ml/hr.  Goal rate of 80 ml/hr  provides 88 gm protein and ~ 1843 kcal for full support  Electrolytes in TPN: increased Na & K, decreased Ca and Phos, standard  Mag, Cl:Ac ratio 1:1  TPN to contain standard multivitamins daily and trace elements on MWF only due to national shortage  Continue resistant SSI q6h and glimepiride and metformin  Increase Insulin in TPN to 35 units per 24 hr  TPN lab panels on Mondays & Thursdays.  F/u daily.  Minda Ditto PharmD Pager [phone] 08/29/2018, 10:18 AM

## 2018-08-30 LAB — COMPREHENSIVE METABOLIC PANEL
ALT: 9 U/L (ref 0–44)
AST: 16 U/L (ref 15–41)
Albumin: 2.9 g/dL — ABNORMAL LOW (ref 3.5–5.0)
Alkaline Phosphatase: 65 U/L (ref 38–126)
Anion gap: 9 (ref 5–15)
BUN: 56 mg/dL — ABNORMAL HIGH (ref 8–23)
CO2: 29 mmol/L (ref 22–32)
Calcium: 9.2 mg/dL (ref 8.9–10.3)
Chloride: 97 mmol/L — ABNORMAL LOW (ref 98–111)
Creatinine, Ser: 1.14 mg/dL — ABNORMAL HIGH (ref 0.44–1.00)
GFR calc Af Amer: 56 mL/min — ABNORMAL LOW (ref >60)
GFR calc non Af Amer: 49 mL/min — ABNORMAL LOW (ref >60)
Glucose, Bld: 143 mg/dL — ABNORMAL HIGH (ref 70–99)
Potassium: 4.1 mmol/L (ref 3.5–5.1)
Sodium: 135 mmol/L (ref 135–145)
Total Bilirubin: 0.5 mg/dL (ref 0.3–1.2)
Total Protein: 6.4 g/dL — ABNORMAL LOW (ref 6.5–8.1)

## 2018-08-30 LAB — GLUCOSE, CAPILLARY
Glucose-Capillary: 112 mg/dL — ABNORMAL HIGH (ref 70–99)
Glucose-Capillary: 136 mg/dL — ABNORMAL HIGH (ref 70–99)
Glucose-Capillary: 150 mg/dL — ABNORMAL HIGH (ref 70–99)
Glucose-Capillary: 221 mg/dL — ABNORMAL HIGH (ref 70–99)

## 2018-08-30 LAB — PHOSPHORUS: Phosphorus: 3.6 mg/dL (ref 2.5–4.6)

## 2018-08-30 LAB — MAGNESIUM: Magnesium: 2.2 mg/dL (ref 1.7–2.4)

## 2018-08-30 MED ORDER — ADULT MULTIVITAMIN W/MINERALS CH
1.0000 | ORAL_TABLET | Freq: Every day | ORAL | Status: DC
  Administered 2018-08-31 – 2018-09-01 (×2): 1 via ORAL
  Filled 2018-08-30 (×2): qty 1

## 2018-08-30 MED ORDER — TRAVASOL 10 % IV SOLN
INTRAVENOUS | Status: AC
  Administered 2018-08-30: 19:00:00 via INTRAVENOUS
  Filled 2018-08-30: qty 662.4

## 2018-08-30 NOTE — Progress Notes (Signed)
  Subjective: Pt feeling better.  Tolerated tube clamping again with clears.  Ng removed last night  Objective: Vital signs in last 24 hours: Temp:  [98.1 F (36.7 C)-98.7 F (37.1 C)] 98.5 F (36.9 C) (08/20 0604) Pulse Rate:  [84-88] 84 (08/20 0604) Resp:  [17-18] 18 (08/20 0604) BP: (135-168)/(63-123) 149/63 (08/20 0604) SpO2:  [100 %] 100 % (08/20 0604)   Intake/Output from previous day: 08/19 0701 - 08/20 0700 In: 3942.5 [P.O.:720; I.V.:3222.5] Out: 3200 [Urine:1900; Emesis/NG output:500; Stool:800] Intake/Output this shift: No intake/output data recorded.   General appearance: alert and cooperative GI: normal findings: soft, non-tender and distended Ostomy: beefy red, stool in bag Incision: no significant drainage, no significant erythema  Lab Results:  Recent Labs    08/27/18 0929  WBC 4.1  HGB 8.7*  HCT 27.9*  PLT 382   BMET Recent Labs    08/29/18 0530 08/30/18 0608  NA 139 135  K 4.0 4.1  CL 102 97*  CO2 29 29  GLUCOSE 171* 143*  BUN 58* 56*  CREATININE 1.21* 1.14*  CALCIUM 9.4 9.2   PT/INR No results for input(s): LABPROT, INR in the last 72 hours. ABG No results for input(s): PHART, HCO3 in the last 72 hours.  Invalid input(s): PCO2, PO2  MEDS, Scheduled . amLODipine  5 mg Oral Daily  . aspirin  81 mg Oral Daily  . atorvastatin  10 mg Oral q1800  . clopidogrel  75 mg Oral Daily  . enoxaparin (LOVENOX) injection  40 mg Subcutaneous Q24H  . gabapentin  300 mg Oral BID  . glimepiride  1 mg Oral BID WC  . insulin aspart  0-20 Units Subcutaneous Q6H  . levothyroxine  50 mcg Oral Daily  . metFORMIN  1,000 mg Oral Q breakfast  . triamterene-hydrochlorothiazide  1 tablet Oral Daily    Studies/Results: Dg Abd Portable 1v  Result Date: 08/28/2018 CLINICAL DATA:  Check gastric catheter placement, follow up small bowel obstruction EXAM: PORTABLE ABDOMEN - 1 VIEW COMPARISON:  08/24/2018 FINDINGS: Ostomy is noted in the right mid abdomen.  Previously seen red rubber catheter has been removed in the interval. Gastric catheter is noted within the stomach. The previously seen small bowel dilatation has resolved. No free air is noted. IMPRESSION: Resolution of previously seen small bowel dilatation. Electronically Signed   By: Inez Catalina M.D.   On: 08/28/2018 09:34    Assessment: s/p  Patient Active Problem List   Diagnosis Date Noted  . SBO (small bowel obstruction) ([place]) 08/22/2018  . Dehydration 08/21/2018  . Pre-operative cardiovascular examination 06/07/2018  . Rectal cancer ([place]) 11/06/2017  . Claudication in peripheral vascular disease ([place]) 05/08/2017  . Carotid artery disease ([place]) 04/14/2017  . PAD (peripheral artery disease) Rt ABI 0.7, Lt ABI 0.46 12/25/2012  . S/P arterial stent, 12/25/13, successful diamondback orbital rotational arthrectomy, PTA using chocolate  balloon and stenting using I DEV stent of long segment calcified high-grade proximal and mid r 12/25/2012  . Gangrene of toe, Rt second toe 12/25/2012  . Essential hypertension 12/13/2012  . Non-insulin treated type 2 diabetes mellitus ([place]) 12/13/2012  . Critical lower limb ischemia 12/13/2012  . Tobacco abuse 12/13/2012    Pt with early post op bowel obstruction, seems to be resolving.   Plan: Full liquids Cont TPN Ambulate   LOS: 8 days     .Rosario Adie, [facility], [place]   08/30/2018 8:56 AM

## 2018-08-30 NOTE — Care Management Important Message (Signed)
Important Message  Patient Details IM Letter given to Marney Doctor RN to present to the Patient Name: JOZI MALACHI MRN: [id number] Date of Birth: August 12, 1950   Medicare Important Message Given:  Yes     Kerin Salen 08/30/2018, 9:34 [place] Message  Patient Details  Name: TAKEILA THAYNE MRN: [id number] Date of Birth: 1950/09/27   Medicare Important Message Given:  Yes     Kerin Salen 08/30/2018, 9:34 AM

## 2018-08-30 NOTE — Progress Notes (Signed)
PHARMACY - ADULT TOTAL PARENTERAL NUTRITION CONSULT NOTE   Pharmacy Consult for TPN Indication: SBO  Patient Measurements:Height: 5\' 6"  (167.6 cm) Weight: 140 lb 11.2 oz (63.8 kg) IBW/kg (Calculated) : 59.3 TPN AdjBW (KG): 64.9 Weight: 70 kg  Insulin Requirements: on glimepiride 1 mg bid, metformin XR 1000 qday, rSSI. 20 units SSI/24 hrs; CBGs  136-215, TPN with 35 units insulin/bag - delivers 1.4 units/hr  Current Nutrition: NPO, TPN  IVF: dc'd 8/14  Central access: PAC  TPN start date: 8/12  ASSESSMENT                                                                                                          HPI: 66 yoF s/p colorectal surgery and diverting ileostomy on 08/03/18. PMH: arthritis, back pain, depression, DM, GERD, HTN, hypercholesterolemia, rectal cancer, thyroid disease, PVD   Pt re-admitted 8/11 w/dehydration.   Significant events: SBO on KUB 8/11 PM 8/14 KUB slight improvement of SBO 8/16 still large NG output 8/17: begin sips clears, ice 8/20: begin FL diet, CBGs were increased with CL diet  Today:    Glucose: this am CBG 136  Electrolytes: Na was low on admission due to high output ostomy  - 135 today, K 4.1 ( goal >/=4 w/SBO)  CorrCa 10, Phos 3.6,  mag 2.2 (goal >=2 w/ SBO)   Renal: on Maxzide 75-50 qday; SCr 1.59>>1.14, BUN 74> 56, dehydrated on admission - now resolved, (SCr was 0.88> 1.24 end of July); I/O - 1.5L; NGO 2.4L; 550 ml ostomy output, UOP 1L   LFTs: WNL  TGs: 86 (8/13), 123 (8/17)  Prealbumin: 14.2 (8/13), 16.9 (8/17)  NUTRITIONAL GOALS                                                                   RD recs: 8/13  Kcal:  1800-2000 Protein:  80-90g Fluid:  2L/day  Custom TPN at goal rate of 80 ml/hr provides: - 88.32 g/day protein (46 g/L) - 57.6 g/day Lipid  (30 g/L) - 268.8  g/day Dextrose (14 %) -  1843 Kcal/day  PLAN                                                                                                At 1800  today:  Reduce TPN to 60 ml/hr, FL diet  Electrolytes in TPN: increased Na & K, decreased Ca and Phos, standard Mag, Cl:Ac ratio 1:1  TPN to contain standard multivitamins daily and trace elements on MWF only due to national shortage  Continue resistant SSI q6h and glimepiride and metformin  Decrease Insulin in TPN to 25 units per 24 hr  TPN lab panels on Mondays & Thursdays.  BMET in am  F/u daily.  Minda Ditto PharmD Pager [phone] 08/30/2018, 7:24 AM

## 2018-08-30 NOTE — Progress Notes (Signed)
Patient has no complaints of N/V and is tolerating clear liquids. NG tube removed per order and patient educated on purpose for the tube (clamping trials).  Norlene Duel RN, BSN

## 2018-08-31 DIAGNOSIS — K56609 Unspecified intestinal obstruction, unspecified as to partial versus complete obstruction: Secondary | ICD-10-CM

## 2018-08-31 LAB — BASIC METABOLIC PANEL
Anion gap: 7 (ref 5–15)
BUN: 49 mg/dL — ABNORMAL HIGH (ref 8–23)
CO2: 27 mmol/L (ref 22–32)
Calcium: 9.2 mg/dL (ref 8.9–10.3)
Chloride: 104 mmol/L (ref 98–111)
Creatinine, Ser: 0.97 mg/dL (ref 0.44–1.00)
GFR calc Af Amer: >60 mL/min (ref >60)
GFR calc non Af Amer: 59 mL/min — ABNORMAL LOW (ref >60)
Glucose, Bld: 103 mg/dL — ABNORMAL HIGH (ref 70–99)
Potassium: 4.1 mmol/L (ref 3.5–5.1)
Sodium: 138 mmol/L (ref 135–145)

## 2018-08-31 LAB — GLUCOSE, CAPILLARY
Glucose-Capillary: 121 mg/dL — ABNORMAL HIGH (ref 70–99)
Glucose-Capillary: 121 mg/dL — ABNORMAL HIGH (ref 70–99)
Glucose-Capillary: 140 mg/dL — ABNORMAL HIGH (ref 70–99)
Glucose-Capillary: 164 mg/dL — ABNORMAL HIGH (ref 70–99)

## 2018-08-31 MED ORDER — TRAVASOL 10 % IV SOLN
INTRAVENOUS | Status: AC
  Administered 2018-08-31: 18:00:00 via INTRAVENOUS
  Filled 2018-08-31: qty 662.4

## 2018-08-31 NOTE — Progress Notes (Signed)
  Subjective: Pt feeling better.  Tolerated fulls.    Objective: Vital signs in last 24 hours: Temp:  [98.7 F (37.1 C)-99.8 F (37.7 C)] 98.8 F (37.1 C) (08/21 0551) Pulse Rate:  [78-81] 81 (08/21 0551) Resp:  [15-20] 15 (08/21 0551) BP: (135-155)/(62-64) 135/64 (08/21 0551) SpO2:  [100 %] 100 % (08/21 0551)   Intake/Output from previous day: 08/20 0701 - 08/21 0700 In: 2010.6 [P.O.:240; I.V.:1770.6] Out: 800 [Urine:650; Stool:150] Intake/Output this shift: Total I/O In: 760 [P.O.:480; I.V.:280] Out: 0    General appearance: alert and cooperative GI: normal findings: soft, non-tender and distended Ostomy: pink, viable, stool in bag Incision: no significant drainage, no significant erythema  Lab Results:  No results for input(s): WBC, HGB, HCT, PLT in the last 72 hours. BMET Recent Labs    08/30/18 0608 08/31/18 0320  NA 135 138  K 4.1 4.1  CL 97* 104  CO2 29 27  GLUCOSE 143* 103*  BUN 56* 49*  CREATININE 1.14* 0.97  CALCIUM 9.2 9.2   PT/INR No results for input(s): LABPROT, INR in the last 72 hours. ABG No results for input(s): PHART, HCO3 in the last 72 hours.  Invalid input(s): PCO2, PO2  MEDS, Scheduled . amLODipine  5 mg Oral Daily  . aspirin  81 mg Oral Daily  . atorvastatin  10 mg Oral q1800  . clopidogrel  75 mg Oral Daily  . enoxaparin (LOVENOX) injection  40 mg Subcutaneous Q24H  . gabapentin  300 mg Oral BID  . glimepiride  1 mg Oral BID WC  . insulin aspart  0-20 Units Subcutaneous Q6H  . levothyroxine  50 mcg Oral Daily  . metFORMIN  1,000 mg Oral Q breakfast  . multivitamin with minerals  1 tablet Oral Q lunch  . triamterene-hydrochlorothiazide  1 tablet Oral Daily    Studies/Results: No results found.  Assessment: s/p  Patient Active Problem List   Diagnosis Date Noted  . SBO (small bowel obstruction) ([place]) 08/22/2018  . Dehydration 08/21/2018  . Pre-operative cardiovascular examination 06/07/2018  . Rectal cancer ([place])  11/06/2017  . Claudication in peripheral vascular disease ([place]) 05/08/2017  . Carotid artery disease ([place]) 04/14/2017  . PAD (peripheral artery disease) Rt ABI 0.7, Lt ABI 0.46 12/25/2012  . S/P arterial stent, 12/25/13, successful diamondback orbital rotational arthrectomy, PTA using chocolate  balloon and stenting using I DEV stent of long segment calcified high-grade proximal and mid r 12/25/2012  . Gangrene of toe, Rt second toe 12/25/2012  . Essential hypertension 12/13/2012  . Non-insulin treated type 2 diabetes mellitus ([place]) 12/13/2012  . Critical lower limb ischemia 12/13/2012  . Tobacco abuse 12/13/2012    Pt with early post op bowel obstruction, seems to be resolving.   Plan: Soft diet Wean TPN Ambulate   LOS: 9 days     .Rosario Adie, [facility], [place]   08/31/2018 12:17 PM

## 2018-08-31 NOTE — Progress Notes (Signed)
Nicole Sellers   DOB:02-Feb-1950   ID#:[id number]   RWE#:[id number]  Oncology follow up  Subjective: Patient underwent low anterior resection August 03, 2018, was readmitted on small bowel obstruction.  Is being managed and she has improved, she was eating regular soft diet when I saw her in the late afternoon.   Objective:  Vitals:   08/31/18 0551 08/31/18 1343  BP: 135/64 126/69  Pulse: 81 88  Resp: 15 16  Temp: 98.8 F (37.1 C) 98.7 F (37.1 C)  SpO2: 100% 100%    Body mass index is 22.71 kg/m.  Intake/Output Summary (Last 24 hours) at 08/31/2018 1752 Last data filed at 08/31/2018 1400 Gross per 24 hour  Intake 2157.07 ml  Output 800 ml  Net 1357.07 ml     Sclerae unicteric  Oropharynx clear  No peripheral adenopathy  Lungs clear -- no rales or rhonchi  Heart regular rate and rhythm  Abdomen soft, non tender   No leg edema   Labs:  Lab Results  Component Value Date   WBC 4.1 08/27/2018   HGB 8.7 (L) 08/27/2018   HCT 27.9 (L) 08/27/2018   MCV 97.2 08/27/2018   PLT 382 08/27/2018   NEUTROABS 2.7 08/27/2018   Urine Studies No results for input(s): UHGB, CRYS in the last 72 hours.  Invalid input(s): UACOL, UAPR, USPG, UPH, UTP, UGL, UKET, UBIL, UNIT, UROB, [place], UEPI, UWBC, Junie Panning [place], [place], [place]  Basic Metabolic Panel: Recent Labs  Lab 08/25/18 0313 08/26/18 0405 08/27/18 0929 08/28/18 0317 08/29/18 0530 08/30/18 0608 08/31/18 0320  NA 139 142 142 140 139 135 138  K 3.9 4.6 4.3 3.8 4.0 4.1 4.1  CL 99 96* 98 101 102 97* 104  CO2 29 31 33* 29 29 29 27   GLUCOSE 110* 127* 109* 145* 171* 143* 103*  BUN 29* 37* 48* 49* 58* 56* 49*  CREATININE 0.82 0.92 1.03* 1.06* 1.21* 1.14* 0.97  CALCIUM 9.5 9.7 9.5 9.2 9.4 9.2 9.2  MG 2.1 1.9 2.1  --  2.2 2.2  --   PHOS 4.3 5.6* 4.1  --  3.3 3.6  --    GFR Estimated Creatinine Clearance: 50.5 mL/min (by C-G formula based on SCr of 0.97 mg/dL). Liver Function Tests: Recent Labs  Lab 08/27/18 0929  08/30/18 0608  AST 15 16  ALT 9 9  ALKPHOS 69 65  BILITOT 0.5 0.5  PROT 6.1* 6.4*  ALBUMIN 2.8* 2.9*   No results for input(s): LIPASE, AMYLASE in the last 168 hours. No results for input(s): AMMONIA in the last 168 hours. Coagulation profile No results for input(s): INR, PROTIME in the last 168 hours.  CBC: Recent Labs  Lab 08/27/18 0929  WBC 4.1  NEUTROABS 2.7  HGB 8.7*  HCT 27.9*  MCV 97.2  PLT 382   Cardiac Enzymes: No results for input(s): CKTOTAL, CKMB, CKMBINDEX, TROPONINI in the last 168 hours. BNP: Invalid input(s): POCBNP CBG: Recent Labs  Lab 08/30/18 1656 08/30/18 2357 08/31/18 0603 08/31/18 1139 08/31/18 1729  GLUCAP 112* 221* 121* 164* 121*   D-Dimer No results for input(s): DDIMER in the last 72 hours. Hgb A1c No results for input(s): HGBA1C in the last 72 hours. Lipid Profile No results for input(s): CHOL, HDL, LDLCALC, TRIG, CHOLHDL, LDLDIRECT in the last 72 hours. Thyroid function studies No results for input(s): TSH, T4TOTAL, T3FREE, THYROIDAB in the last 72 hours.  Invalid input(s): FREET3 Anemia work up No results for input(s): VITAMINB12, FOLATE, FERRITIN, TIBC, IRON, RETICCTPCT in  the last 72 hours. Microbiology Recent Results (from the past 240 hour(s))  SARS Coronavirus 2 [facility] order, Performed in [facility] hospital lab)     Status: None   Collection Time: 08/21/18  7:00 PM  Result Value Ref Range Status   SARS Coronavirus 2 NEGATIVE NEGATIVE Final    Comment: (NOTE) If result is NEGATIVE SARS-CoV-2 target nucleic acids are NOT DETECTED. The SARS-CoV-2 RNA is generally detectable in upper and lower  respiratory specimens during the acute phase of infection. The lowest  concentration of SARS-CoV-2 viral copies this assay can detect is 250  copies / mL. A negative result does not preclude SARS-CoV-2 infection  and should not be used as the sole basis for treatment or other  patient management decisions.  A negative result  may occur with  improper specimen collection / handling, submission of specimen other  than nasopharyngeal swab, presence of viral mutation(s) within the  areas targeted by this assay, and inadequate number of viral copies  (<250 copies / mL). A negative result must be combined with clinical  observations, patient history, and epidemiological information. If result is POSITIVE SARS-CoV-2 target nucleic acids are DETECTED. The SARS-CoV-2 RNA is generally detectable in upper and lower  respiratory specimens dur ing the acute phase of infection.  Positive  results are indicative of active infection with SARS-CoV-2.  Clinical  correlation with patient history and other diagnostic information is  necessary to determine patient infection status.  Positive results do  not rule out bacterial infection or co-infection with other viruses. If result is PRESUMPTIVE POSTIVE SARS-CoV-2 nucleic acids MAY BE PRESENT.   A presumptive positive result was obtained on the submitted specimen  and confirmed on repeat testing.  While 2019 novel coronavirus  (SARS-CoV-2) nucleic acids may be present in the submitted sample  additional confirmatory testing may be necessary for epidemiological  and / or clinical management purposes  to differentiate between  SARS-CoV-2 and other Sarbecovirus currently known to infect humans.  If clinically indicated additional testing with an alternate test  methodology [phone]) is advised. The SARS-CoV-2 RNA is generally  detectable in upper and lower respiratory sp ecimens during the acute  phase of infection. The expected result is Negative. Fact Sheet for Patients:  StrictlyIdeas.no Fact Sheet for Healthcare Providers: BankingDealers.co.za This test is not yet approved or cleared by the [place] FDA and has been authorized for detection and/or diagnosis of SARS-CoV-2 by FDA under an Emergency Use Authorization (EUA).   This EUA will remain in effect (meaning this test can be used) for the duration of the COVID-19 declaration under Section 564(b)(1) of the Act, 21 U.S.C. section 360bbb-3(b)(1), unless the authorization is terminated or revoked sooner. Performed at [facility], [place] [address], [place], [place] [address]       Studies:  No results found.  Assessment: 68 y.o. with   1. Post op ileus, resolving 2. Rectal cancer, s/p total neoadjuvant decrease chemo and radiation, anterior resection on August 03, 2018.  She has achieved complete pathological response to neoadjuvant therapy 3, anemia secondary to surgery and chemo  4. HTN and DM     Plan:  -I discussed her surgical pathology findings, which showed complete response, no residual tumor, or lymph nodes negative.  Predict low risk of recurrence in the future.  -I do not recommend more adjuvant chemotherapy, she is quite pleased. -We discussed cancer surveillance, I will see her back in my office in 1 month.   [name]  Burr Medico, MD 08/31/2018  5:52 PM

## 2018-08-31 NOTE — Consult Note (Signed)
[facility] Nurse ostomy follow up Stoma type/location: LUQ (almost medial) end ileostomy performed 08/03/18, readmitted with post op bowel obstruction.  Stomal assessment/size: 1 1/2" round, slighted budded, pink, moist  Peristomal assessment: pouch intact changed by nursing staff Treatment options for stomal/peristomal skin: 2" barrier ring Output liquid green Ostomy pouching: 1pc.with 2" barrier ring Education provided:  Patient independent with ostomy care. Answered question related to ordering ostomy supplies. Recommended her to contact [place] start (she was previously enrolled) to have assistance with finding supplier that will accept patient's insurance. She has been denied any HHRN due to insurance coverage. She has her secure start package at home and is aware she needs to contact them with the number on her pouch and the number of the barrier ring.  Enrolled patient in [place] Discharge program: Yes, previously. WOC nursing will follow along at least weekly for ostomy needs.  [place], [place], [place]

## 2018-08-31 NOTE — Progress Notes (Addendum)
PHARMACY - ADULT TOTAL PARENTERAL NUTRITION CONSULT NOTE   Pharmacy Consult for TPN Indication: SBO  Patient Measurements:Height: 5\' 6"  (167.6 cm) Weight: 140 lb 11.2 oz (63.8 kg) IBW/kg (Calculated) : 59.3 TPN AdjBW (KG): 64.9 Weight: 70 kg  Insulin Requirements: on glimepiride 1 mg bid, metformin XR 1000 qday, rSSI. 16 units SSI/24 hrs; CBGs  112-221, TPN with 25 units insulin/bag - delivers 1.4 units/hr  Current Nutrition: NPO, TPN  IVF: dc'd 8/14  Central access: PAC  TPN start date: 8/12  ASSESSMENT                                                                                                          HPI: 88 yoF s/p colorectal surgery and diverting ileostomy on 08/03/18. PMH: arthritis, back pain, depression, DM, GERD, HTN, hypercholesterolemia, rectal cancer, thyroid disease, PVD   Pt re-admitted 8/11 w/dehydration.   Significant events: SBO on KUB 8/11 PM 8/14 KUB slight improvement of SBO 8/16 still large NG output 8/17: begin sips clears, ice 8/20: begin FL diet, CBGs increased with CL diet, reduced TPN rate to avoid excessive CBGs with oral diet. Po MVI  Today:    Glucose: this am CBG 121  Electrolytes: Na was low on admission due to high output ostomy  - 138 today, K 4.1 ( goal >/=4 w/SBO)  CorrCa 10, Phos 3.6,  mag 2.2 (goal >=2 w/ SBO)   Renal: on Maxzide 75-50 qday; SCr 1.59>>0.97, BUN 74> 49, dehydrated on admission - now resolved, (SCr was 0.88> 1.24 end of July); I/O  + 1.1L; ostomy output 528ml, UOP 1L   LFTs: WNL  TGs: 86 (8/13), 123 (8/17)  Prealbumin: 14.2 (8/13), 16.9 (8/17)  NUTRITIONAL GOALS                                                                   RD recs: 8/13  Kcal:  1800-2000 Protein:  80-90g Fluid:  2L/day  Custom TPN at goal rate of 80 ml/hr provides: - 88.32 g/day protein (46 g/L) - 57.6 g/day Lipid  (30 g/L) - 268.8  g/day Dextrose (14 %) -  1843 Kcal/day  PLAN                                                                                                 At 1800 today:  Continue TPN at 60 ml/hr, FL diet  Electrolytes in TPN: increased Na & K, decreased  Ca and Phos, standard Mag, Cl:Ac ratio 1:1  Continue resistant SSI q6h and glimepiride and metformin   Insulin in TPN to 25 units per 24 hr  TPN lab panels on Mondays & Thursdays.  F/u daily.  Minda Ditto PharmD Pager [phone] 08/31/2018, 7:24 AM   Addendum 1300 CCS communicated to Pharmacy that TPN can be weaned and d/c in am 8/22 TPN already ordered for 60 ml/hr rate Will plan to continue 74ml/hr overnight, reduce to 30 ml/hr in am 8/22 and discontinue.  Minda Ditto PharmD Pager [phone] 08/31/2018, 2:49 PM

## 2018-08-31 NOTE — Progress Notes (Signed)
Physical Therapy Treatment Patient Details Name: Nicole Sellers MRN: [id number] DOB: August 30, 1950 Today's Date: 08/31/2018    History of Present Illness 68 yo female admitted to [facility] on 8/12 with dehydration and fatigue, pt with recent hospitalization s/p lower anterior resection with diverting ileostomy 7/24. 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    PT Comments    Assisted with amb in hallway.  General Gait Details: SPC R UE and IV Pole L UE therapist assist for balance.  Mod unsteady.  Tolerated an increased distance.    Follow Up Recommendations  Home health PT     Equipment Recommendations  3in1 (PT)    Recommendations for Other Services       Precautions / Restrictions Precautions Precautions: Fall Precaution Comments: ileostomy    Mobility  Bed Mobility               General bed mobility comments: pt sitting EOB Indep on arrival  Transfers Overall transfer level: Needs assistance Equipment used: Straight cane Transfers: Sit to/from Stand Sit to Stand: Min guard;Supervision Stand pivot transfers: Min guard       General transfer comment: good use of hands to steady self  Ambulation/Gait Ambulation/Gait assistance: Min guard;Supervision Gait Distance (Feet): 185 Feet Assistive device: Straight cane Gait Pattern/deviations: Step-to pattern;Decreased step length - right;Decreased step length - left Gait velocity: decreased   General Gait Details: SPC R UE and IV Pole L UE therapist assist for balance.  Mod unsteady.  Tolerated an increased distance   Marine scientist [place] (Stroke Patients Only)       Balance                                            Cognition Arousal/Alertness: Awake/alert Behavior During Therapy: WFL for tasks  assessed/performed Overall Cognitive Status: Within Functional Limits for tasks assessed                                        Exercises      General Comments        Pertinent Vitals/Pain Pain Assessment: No/denies pain    Home Living                      Prior Function            PT Goals (current goals can now be found in the care plan section) Progress towards PT goals: Progressing toward goals    Frequency    Min 3X/week      PT Plan Current plan remains appropriate    Co-evaluation              AM-PAC PT "6 Clicks" Mobility   Outcome Measure  Help needed turning from your back to your side while in a flat bed without using bedrails?: A Little Help needed moving from lying on your back to sitting on the side of a flat bed without using bedrails?: A Little Help needed moving to and from a bed to a chair (including a wheelchair)?: A Little Help needed standing up from  a chair using your arms (e.g., wheelchair or bedside chair)?: A Little Help needed to walk in hospital room?: A Little Help needed climbing 3-5 steps with a railing? : A Little 6 Click Score: 18    End of Session Equipment Utilized During Treatment: Gait belt Activity Tolerance: Patient tolerated treatment well Patient left: in bed;with call bell/phone within reach   PT Visit Diagnosis: Difficulty in walking, not elsewhere classified (R26.2)     Time: 1353-1405 PT Time Calculation (min) (ACUTE ONLY): 12 min  Charges:  $Gait Training: 8-22 mins                     {Jazari Ober  PTA Acute  Rehabilitation [employer]      [phone] Office      [phone]

## 2018-09-01 LAB — GLUCOSE, CAPILLARY
Glucose-Capillary: 159 mg/dL — ABNORMAL HIGH (ref 70–99)
Glucose-Capillary: 167 mg/dL — ABNORMAL HIGH (ref 70–99)
Glucose-Capillary: 106 mg/dL — ABNORMAL HIGH (ref 70–99)

## 2018-09-01 MED ORDER — INSULIN ASPART 100 UNIT/ML ~~LOC~~ SOLN
0.0000 [IU] | Freq: Three times a day (TID) | SUBCUTANEOUS | Status: DC
  Administered 2018-09-01 – 2018-09-02 (×2): 2 [IU] via SUBCUTANEOUS

## 2018-09-01 NOTE — Progress Notes (Signed)
PHARMACY - ADULT TOTAL PARENTERAL NUTRITION CONSULT NOTE   Pharmacy Consult for TPN Indication: SBO  Patient Measurements:Height: 5\' 6"  (167.6 cm) Weight: 140 lb 11.2 oz (63.8 kg) IBW/kg (Calculated) : 59.3 TPN AdjBW (KG): 64.9 Weight: 70 kg  Insulin Requirements: on glimepiride 1 mg bid, metformin XR 1000 qday, rSSI. 17 units SSI/24 hrs; CBGs  121-164, TPN with 25 units insulin/bag - delivers 1.4 units/hr  Current Nutrition: NPO, TPN  IVF: dc'd 8/14  Central access: PAC  TPN start date: 8/12  ASSESSMENT                                                                                                          HPI: 70 yoF s/p colorectal surgery and diverting ileostomy on 08/03/18. PMH: arthritis, back pain, depression, DM, GERD, HTN, hypercholesterolemia, rectal cancer, thyroid disease, PVD   Pt re-admitted 8/11 w/dehydration.   Significant events: SBO on KUB 8/11 PM 8/14 KUB slight improvement of SBO 8/16 still large NG output 8/17: begin sips clears, ice 8/20: begin FL diet, CBGs increased with CL diet, reduced TPN rate to avoid excessive CBGs with oral diet. Po MVI 8/22: wean and d/c TPN  Today:    Glucose: this am CBG 159  Electrolytes: Na was low on admission due to high output ostomy  - 138 today, K 4.1 ( goal >/=4 w/SBO)  CorrCa 10, Phos 3.6,  mag 2.2 (goal >=2 w/ SBO)   Renal: on Maxzide 75-50 qday; SCr 1.59>>0.97, BUN 74> 49, dehydrated on admission - now resolved, (SCr was 0.88> 1.24 end of July); I/O  + 1.1L; ostomy output 534ml, UOP 1L   LFTs: WNL  TGs: 86 (8/13), 123 (8/17)  Prealbumin: 14.2 (8/13), 16.9 (8/17)  NUTRITIONAL GOALS                                                                   RD recs: 8/13  Kcal:  1800-2000 Protein:  80-90g Fluid:  2L/day  Custom TPN at goal rate of 80 ml/hr provides: - 88.32 g/day protein (46 g/L) - 57.6 g/day Lipid  (30 g/L) - 268.8  g/day Dextrose (14 %) -  1843 Kcal/day  PLAN                                                                                                 At 8am  Reduce TPN to 30 ml/hr, discontinue at 11am  Will change Novolog  sliding scale to sensitive tid ac, PTA antihyperglycemic meds are continued   Minda Ditto PharmD Pager [phone] 09/01/2018, 7:54 AM

## 2018-09-01 NOTE — Progress Notes (Signed)
  Subjective: Pt feeling better.  Tolerating soft diet.    Objective: Vital signs in last 24 hours: Temp:  [98 F (36.7 C)-98.8 F (37.1 C)] 98.8 F (37.1 C) (08/22 0456) Pulse Rate:  [80-88] 81 (08/22 0456) Resp:  [16-19] 19 (08/22 0456) BP: (125-131)/(62-69) 131/62 (08/22 0456) SpO2:  [100 %] 100 % (08/22 0456)   Intake/Output from previous day: 08/21 0701 - 08/22 0700 In: 2023.9 [P.O.:1200; I.V.:823.9] Out: 600 [Urine:300; Stool:300] Intake/Output this shift: No intake/output data recorded.   General appearance: alert and cooperative GI: normal findings: soft, non-tender and distended Ostomy: pink, viable, stool in bag Incision: no significant drainage, no significant erythema  Lab Results:  No results for input(s): WBC, HGB, HCT, PLT in the last 72 hours. BMET Recent Labs    08/30/18 0608 08/31/18 0320  NA 135 138  K 4.1 4.1  CL 97* 104  CO2 29 27  GLUCOSE 143* 103*  BUN 56* 49*  CREATININE 1.14* 0.97  CALCIUM 9.2 9.2   PT/INR No results for input(s): LABPROT, INR in the last 72 hours. ABG No results for input(s): PHART, HCO3 in the last 72 hours.  Invalid input(s): PCO2, PO2  MEDS, Scheduled . amLODipine  5 mg Oral Daily  . aspirin  81 mg Oral Daily  . atorvastatin  10 mg Oral q1800  . clopidogrel  75 mg Oral Daily  . enoxaparin (LOVENOX) injection  40 mg Subcutaneous Q24H  . gabapentin  300 mg Oral BID  . glimepiride  1 mg Oral BID WC  . insulin aspart  0-20 Units Subcutaneous Q6H  . levothyroxine  50 mcg Oral Daily  . metFORMIN  1,000 mg Oral Q breakfast  . multivitamin with minerals  1 tablet Oral Q lunch  . triamterene-hydrochlorothiazide  1 tablet Oral Daily    Studies/Results: No results found.  Assessment: s/p  Patient Active Problem List   Diagnosis Date Noted  . SBO (small bowel obstruction) ([place]) 08/22/2018  . Dehydration 08/21/2018  . Pre-operative cardiovascular examination 06/07/2018  . Rectal cancer ([place]) 11/06/2017  .  Claudication in peripheral vascular disease ([place]) 05/08/2017  . Carotid artery disease ([place]) 04/14/2017  . PAD (peripheral artery disease) Rt ABI 0.7, Lt ABI 0.46 12/25/2012  . S/P arterial stent, 12/25/13, successful diamondback orbital rotational arthrectomy, PTA using chocolate  balloon and stenting using I DEV stent of long segment calcified high-grade proximal and mid r 12/25/2012  . Gangrene of toe, Rt second toe 12/25/2012  . Essential hypertension 12/13/2012  . Non-insulin treated type 2 diabetes mellitus ([place]) 12/13/2012  . Critical lower limb ischemia 12/13/2012  . Tobacco abuse 12/13/2012    Pt with early post op bowel obstruction, seems to be resolving.   Plan: Soft diet D/c TPN Ambulate D/c tom   LOS: 10 days     .Rosario Adie, [facility], [place]   09/01/2018 7:50 AM

## 2018-09-02 LAB — GLUCOSE, CAPILLARY: Glucose-Capillary: 161 mg/dL — ABNORMAL HIGH (ref 70–99)

## 2018-09-02 MED ORDER — HEPARIN SOD (PORK) LOCK FLUSH 100 UNIT/ML IV SOLN
500.0000 [IU] | INTRAVENOUS | Status: AC | PRN
  Administered 2018-09-02: 500 [IU]

## 2018-09-02 NOTE — Discharge Summary (Signed)
Physician Discharge Summary  Patient ID: Nicole Sellers MRN: [id number] DOB/AGE: 1950-12-10 68 y.o.  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  She was found to have a postoperative small bowel obstruction.  An NG tube was placed for decompression.  She was placed on TPN for nutrition.  After several days her GI tract decompressed and began to function appropriately.  Her NG tube was removed and her diet was slowly advanced.  By the time of discharge she was tolerating a diet and having good urine output with ostomy output of less than 1 L.  She was instructed on signs and symptoms to be aware of at home.  She was discharged in stable condition.  Consults: None  Significant Diagnostic Studies: labs: cbc, cmet  Treatments: IV hydration, TPN and NG decompression  Discharge Exam: Blood pressure (!) 143/74, pulse 76, temperature 98.6 F (37 C), temperature source Oral, resp. rate 16, height 5\' 6"  (1.676 m), weight 63.8 kg, SpO2 100 %. General appearance: alert and cooperative GI: normal findings: soft, non-tender and Ostomy pink and viable Incision/Wound: clean, dry, intact  Disposition:    Allergies as of 09/02/2018      Reactions   Kiwi Extract Itching   Throat itching    Lisinopril    Bruising   Other    No blood products      Medication List    STOP taking these medications   diphenoxylate-atropine 2.5-0.025 MG tablet Commonly known as: LOMOTIL   loperamide 2 MG capsule Commonly known as: IMODIUM   magic mouthwash w/lidocaine Soln     TAKE these medications   acetaminophen 500 MG tablet Commonly known as:  TYLENOL Take 500 mg by mouth 2 (two) times daily as needed for moderate pain or headache.   albuterol 108 (90 Base) MCG/ACT inhaler Commonly known as: VENTOLIN HFA Inhale 2 puffs into the lungs every 6 (six) hours as needed for wheezing or shortness of breath.   amLODipine 5 MG tablet Commonly known as: NORVASC TAKE 1 TABLET BY MOUTH EVERY DAY   ASPERCREME EX Apply 1 application topically daily as needed (joint pain).   aspirin 81 MG tablet Take 81 mg by mouth daily.   atorvastatin 10 MG tablet Commonly known as: LIPITOR Take 1 tablet (10 mg total) by mouth daily.   CALCIUM 600+D PO Take 1 tablet by mouth daily.   cetirizine 10 MG tablet Commonly known as: ZYRTEC Take 10 mg by mouth daily.   cholecalciferol 1000 units tablet Commonly known as: VITAMIN D Take 4,000 Units by mouth daily.   clopidogrel 75 MG tablet Commonly known as: PLAVIX Take 1 tablet (75 mg total) by mouth daily with breakfast.   Ferrex 150 150 MG capsule Generic drug: iron polysaccharides Take 150 mg by mouth 2 (two) times daily.   gabapentin 300 MG capsule Commonly known as: NEURONTIN Take 300 mg by mouth 2 (two) times daily.   glimepiride 1 MG tablet Commonly known as: AMARYL Take 1 mg by mouth 2 (two) times daily.   levothyroxine 50 MCG tablet Commonly known as: SYNTHROID Take 1 tablet (50 mcg total) by mouth daily.   lidocaine-prilocaine cream Commonly known as: EMLA Apply to affected area  once   metFORMIN 500 MG 24 hr tablet Commonly known as: GLUCOPHAGE-XR TAKE 2 TABLETS BY MOUTH  TWICE A DAY What changed:   when to take this  additional instructions   mirtazapine 7.5 MG tablet Commonly known as: REMERON Take 2 tablets (15 mg total) by mouth at bedtime. What changed:   when to take this  reasons to take this   multivitamin capsule Take 1 capsule by mouth daily.   NASACORT ALLERGY 24HR NA Place 1 spray into the nose daily as needed (allergies).   ondansetron 8 MG  tablet Commonly known as: Zofran Take 1 tablet (8 mg total) by mouth 2 (two) times daily as needed for refractory nausea / vomiting. Start on day 3 after chemotherapy.   Osteo Bi-Flex Adv Triple St Tabs Take 1 tablet by mouth 2 (two) times daily.   polycarbophil 625 MG tablet Commonly known as: FIBERCON Take 1 tablet (625 mg total) by mouth 3 (three) times daily.   prochlorperazine 10 MG tablet Commonly known as: COMPAZINE Take 1 tablet (10 mg total) by mouth every 6 (six) hours as needed (Nausea or vomiting).   tetrahydrozoline-zinc 0.05-0.25 % ophthalmic solution Commonly known as: VISINE-AC Place 1 drop into both eyes 2 (two) times daily as needed (dry eyes).   triamterene-hydrochlorothiazide 75-50 MG tablet Commonly known as: MAXZIDE Take 1 tablet by mouth daily.   vitamin B-12 500 MCG tablet Commonly known as: CYANOCOBALAMIN Take 500 mcg by mouth daily.      Follow-up Information    Leighton Ruff, MD. Schedule an appointment as soon as possible for a visit in 2 week(s).   Specialty: General Surgery Contact information: [address] [place] [phone]           Signed: Rosario Adie 1/32/4401, 8:00 AM

## 2018-09-02 NOTE — Progress Notes (Signed)
    Home health agencies that serve [id number].        Home Health Agencies Search Results  Results List Table  Home Health Agency Information Quality of Patient Care Rating Patient Survey Summary Rating  [facility] [phone] 4 out of 5 stars 4 out of 5 stars  [facility] [phone] 3 out of 5 stars 4 out of 5 stars  [facility] [phone] 4  out of 5 stars 3 out of 5 stars  [facility] [phone] 4 out of 5 stars 4 out of 5 stars  [facility] [phone] 4  out of 5 stars 4 out of 5 stars  [facility] [phone] 4 out of 5 stars 4 out of 5 stars  [facility] [phone] 3  out of 5 stars 4 out of 5 stars  [facility] [phone] 2  out of 5 stars 3 out of 5 stars  [facility] [phone] 3 out of 5 stars 4 out of 5 stars  [facility] [phone] 4 out of 5 stars Not Available12  [facility] [phone] 3 out of 5 stars 4 out of 5 stars  [facility] [place] [phone] Not Available5 Not Available12  [facility] [phone] 3  out of 5 stars 3 out of 5 stars  [facility] [phone] 5 out of 5 stars 4 out of 5 stars  [facility] [phone] 3  out of 5 stars 4 out of 5 stars  [facility] [phone] 3  out of 5 stars 3 out of 5 stars  [facility] [phone] 3  out of 5 stars Not Available11  [facility] [phone] 4  out of 5 stars 3 out of 5 stars   Home Health Footnotes  Footnote number Footnote as displayed on Home Health Compare  1 This agency provides services under a federal waiver program to non-traditional, chronic long term population.  2 This agency provides services to a special needs population.  3 Not Available.  4 The number of patient episodes for  this measure is too small to report.  5 This measure currently does not have data or provider has been certified/recertified for less than 6 months.  6 The national average for this measure is not provided because of state-to-state differences in data collection.  7 Medicare is not displaying rates for this measure for any home health agency, because of an issue with the data.  8 There were problems with the data and they are being corrected.  9 Zero, or very few, patients met the survey's rules for inclusion. The scores shown, if any, reflect a very small number of surveys and may not accurately tell how an agency is doing.  10 Survey results are based on less than 12 months of data.  11 Fewer than 70 patients completed the survey. Use the scores shown, if any, with caution as the number of surveys may be too low to accurately tell how an agency is doing.  12 No survey results are available for this period.  13 Data suppressed by CMS for one or more quarters.    

## 2018-09-02 NOTE — TOC Initial Note (Signed)
Transition of Care [facility]) - Initial/Assessment Note    Patient Details  Name: DANALEE FLATH MRN: [id number] Date of Birth: 08/25/1950  [facility]) CM/SW Contact:    Joaquin Courts, RN Phone Number: 09/02/2018, 12:28 PM  Clinical Narrative:    [facility] with [facility] for HH[facility].                 Expected Discharge Plan: [place] Barriers to Discharge: No Barriers Identified   Patient Goals and CMS Choice Patient states their goals for this hospitalization and ongoing recovery are:: to go home CMS Medicare.gov Compare Post Acute Care list provided to:: Patient Choice offered to / list presented to : Patient  Expected Discharge Plan and Services Expected Discharge Plan: [place]   Discharge Planning Services: CM Consult Post Acute Care Choice: [place] arrangements for the past 2 months: Apartment Expected Discharge Date: 09/02/18               DME Arranged: N/A DME Agency: NA       HH Arranged: PT [place] Agency: [place] ([place]) Date HH Agency Contacted: 09/02/18 Time [place]: 1227 Representative spoke with at [place]: [place] Arrangements/Services Living arrangements for the past 2 months: [place] with:: Self Patient language and need for interpreter reviewed:: Yes Do you feel safe going back to the place where you live?: Yes      Need for Family Participation in Patient Care: Yes (Comment) Care giver support system in place?: Yes (comment)   Criminal Activity/Legal Involvement Pertinent to Current Situation/Hospitalization: No - Comment as needed  Activities of Daily Living Home Assistive Devices/Equipment: Environmental consultant (specify type), Cane (specify quad or straight) ADL Screening (condition at time of admission) Patient's cognitive ability adequate to safely complete daily activities?: Yes Is the patient deaf or have difficulty hearing?:  No Does the patient have difficulty seeing, even when wearing glasses/contacts?: No Does the patient have difficulty concentrating, remembering, or making decisions?: No Patient able to express need for assistance with ADLs?: Yes Does the patient have difficulty dressing or bathing?: No Independently performs ADLs?: Yes (appropriate for developmental age) Does the patient have difficulty walking or climbing stairs?: No Weakness of Legs: None Weakness of Arms/Hands: None  Permission Sought/Granted                  Emotional Assessment Appearance:: Appears stated age Attitude/Demeanor/Rapport: Engaged Affect (typically observed): Accepting Orientation: : Oriented to Self, Oriented to Place, Oriented to  Time, Oriented to Situation   Psych Involvement: No (comment)  Admission diagnosis:  Dehydration Patient Active Problem List   Diagnosis Date Noted  . SBO (small bowel obstruction) ([place]) 08/22/2018  . Dehydration 08/21/2018  . Pre-operative cardiovascular examination 06/07/2018  . Rectal cancer ([place]) 11/06/2017  . Claudication in peripheral vascular disease ([place]) 05/08/2017  . Carotid artery disease ([place]) 04/14/2017  . PAD (peripheral artery disease) Rt ABI 0.7, Lt ABI 0.46 12/25/2012  . S/P arterial stent, 12/25/13, successful diamondback orbital rotational arthrectomy, PTA using chocolate  balloon and stenting using I DEV stent of long segment calcified high-grade proximal and mid r 12/25/2012  . Gangrene of toe, Rt second toe 12/25/2012  . Essential hypertension 12/13/2012  . Non-insulin treated type 2 diabetes mellitus ([place]) 12/13/2012  . Critical lower limb ischemia 12/13/2012  . Tobacco abuse 12/13/2012   PCP:  Reynold Bowen, MD Pharmacy:   [id number] -  [place], [place] [address] [place] [address] Phone: [phone] Fax: [phone]  [place], [place]  [facility] [address] [place] [address] [place] [address] Phone: [phone] Fax: [place], [place] [address] Phone: [phone] Fax: [phone]     Social Determinants of Health ([place]) Interventions    Readmission Risk Interventions Readmission Risk Prevention Plan 08/24/2018  Transportation Screening Complete  Medication Review (RN Care Manager) Complete  PCP or Specialist appointment within 3-5 days of discharge Not Complete  PCP/Specialist Appt Not Complete comments Not ready for dc  HRI or [place] Complete  SW Recovery Care/Counseling Consult Not Complete  SW Consult Not Complete Comments NA  Palliative Care Screening Not [place] Not Applicable  Some recent data might be hidden

## 2018-09-02 NOTE — Discharge Instructions (Signed)
Bowel Obstruction A bowel obstruction is a blockage in the small or large bowel. The bowel, which is also called the intestine, is a long, slender tube that connects the stomach to the anus. When a person eats and drinks, food and fluids go from the mouth to the stomach to the small bowel. This is where most of the nutrients in the food and fluids are absorbed. After the small bowel, material passes through the large bowel for further absorption until any leftover material leaves the body as stool through the anus during a bowel movement. A bowel obstruction will prevent food and fluids from passing through the bowel as they normally do during digestion. The bowel can become partially or completely blocked. If this condition is not treated, it can be dangerous because the bowel could rupture. What are the causes? Common causes of this condition include:  Scar tissue (adhesions) from previous surgery or treatment with high-energy X-rays (radiation).  Recent surgery. This may cause the movements of the bowel to slow down and cause food to block the intestine.  Inflammatory bowel disease, such as Crohn's disease or diverticulitis.  Growths or tumors.  A bulging organ (hernia).  Twisting of the bowel (volvulus).  A foreign body.  Slipping of a part of the bowel into another part (intussusception). What are the signs or symptoms? Symptoms of this condition include:  Pain in the abdomen. Depending on the degree of obstruction, pain may be: ? Mild or severe. ? Dull cramping or sharp pain. ? In one area or in the entire abdomen.  Nausea and vomiting. Vomit may be greenish or a yellow bile color.  Bloating in the abdomen.  Difficulty passing stool (constipation).  Lack of passing gas.  Frequent belching.  Diarrhea. This may occur if the obstruction is partial and runny stool is able to leak around the obstruction. How is this diagnosed? This condition may be diagnosed based on:  A  physical exam.  Medical history.  Imaging tests of the abdomen or pelvis, such as X-ray or CT scan.  Blood or urine tests. How is this treated? Treatment for this condition depends on the cause and severity of the problem. Treatment may include:  Fluids and pain medicines that are given through an IV. Your health care provider may instruct you not to eat or drink if you have nausea or vomiting.  Eating a simple diet. You may be asked to consume a clear liquid diet for several days. This allows the bowel to rest.  Placement of a small tube (nasogastric tube) into the stomach. This will relieve pain, discomfort, and nausea by removing blocked air and fluids from the stomach. It can also help the obstruction clear up faster.  Surgery. This may be required if other treatments do not work. Surgery may be required for: ? Bowel obstruction from a hernia. This can be an emergency procedure. ? Scar tissue that causes frequent or severe obstructions. Follow these instructions at home: Medicines  Take over-the-counter and prescription medicines only as told by your health care provider.  If you were prescribed an antibiotic medicine, take it as told by your health care provider. Do not stop taking the antibiotic even if you start to feel better. General instructions  Follow instructions from your health care provider about eating restrictions. You may need to avoid solid foods and consume only clear liquids until your condition improves.  Return to your normal activities as told by your health care provider. Ask your health care  provider what activities are safe for you.  Avoid sitting for a long time without moving. Get up to take short walks every 1-2 hours. This is important to improve blood flow and breathing. Ask for help if you feel weak or unsteady.  Keep all follow-up visits as told by your health care provider. This is important. How is this prevented? After having a bowel  obstruction, you are more likely to have another. You may do the following things to prevent another obstruction:  If you have a long-term (chronic) disease, pay attention to your symptoms and contact your health care provider if you have questions or concerns.  Avoid becoming constipated. To prevent or treat constipation, your health care provider may recommend that you: ? Drink enough fluid to keep your urine pale yellow. ? Take over-the-counter or prescription medicines. ? Eat foods that are high in fiber, such as beans, whole grains, and fresh fruits and vegetables. ? Limit foods that are high in fat and processed sugars, such as fried or sweet foods.  Stay active. Exercise for 30 minutes or more, 5 or more days each week. Ask your health care provider which exercises are safe for you.  Avoid stress. Find ways to reduce stress, such as meditation, exercise, or taking time for activities that relax you.  Instead of eating three large meals each day, eat three small meals with three small snacks.  Work with a Microbiologist to make a healthy meal plan that works for you.  Do not use any products that contain nicotine or tobacco, such as cigarettes and e-cigarettes. If you need help quitting, ask your health care provider. Contact a health care provider if you:  Have a fever.  Have chills. Get help right away if you:  Have increased pain or cramping.  Vomit blood.  Have uncontrolled vomiting or nausea.  Cannot drink fluids because of vomiting or pain.  Become confused.  Begin feeling very thirsty (dehydrated).  Have severe bloating.  Feel extremely weak or you faint. Summary  A bowel obstruction is a blockage in the small or large bowel.  A bowel obstruction will prevent food and fluids from passing through the bowel as they normally do during digestion.  Treatment for this condition depends on the cause and severity of the problem. It may include fluids and pain medicines  through an IV, a simple diet, a nasogastric tube, or surgery.  Follow instructions from your health care provider about eating restrictions. You may need to avoid solid foods and consume only clear liquids until your condition improves. This information is not intended to replace advice given to you by your health care provider. Make sure you discuss any questions you have with your health care provider. Document Released: 03/15/2005 Document Revised: 02/02/2018 Document Reviewed: 05/10/2017 Elsevier Patient Education  2020 [employer].

## 2018-09-07 ENCOUNTER — Inpatient Hospital Stay: Payer: Medicare Other | Admitting: Hematology

## 2018-09-07 ENCOUNTER — Inpatient Hospital Stay: Payer: Medicare Other

## 2018-09-20 ENCOUNTER — Other Ambulatory Visit: Payer: Self-pay | Admitting: General Surgery

## 2018-09-20 DIAGNOSIS — C20 Malignant neoplasm of rectum: Secondary | ICD-10-CM

## 2018-10-01 ENCOUNTER — Emergency Department (HOSPITAL_COMMUNITY): Payer: Medicare Other

## 2018-10-01 ENCOUNTER — Inpatient Hospital Stay (HOSPITAL_COMMUNITY)
Admission: EM | Admit: 2018-10-01 | Discharge: 2018-10-06 | DRG: 683 | Disposition: A | Discharge status: Home Health | Payer: Medicare Other | Attending: Internal Medicine | Admitting: Internal Medicine

## 2018-10-01 ENCOUNTER — Other Ambulatory Visit: Payer: Self-pay

## 2018-10-01 ENCOUNTER — Encounter (HOSPITAL_COMMUNITY): Payer: Self-pay | Admitting: Emergency Medicine

## 2018-10-01 ENCOUNTER — Inpatient Hospital Stay (HOSPITAL_COMMUNITY): Payer: Medicare Other

## 2018-10-01 DIAGNOSIS — D689 Coagulation defect, unspecified: Secondary | ICD-10-CM | POA: Diagnosis present

## 2018-10-01 DIAGNOSIS — R8281 Pyuria: Secondary | ICD-10-CM | POA: Diagnosis present

## 2018-10-01 DIAGNOSIS — Z23 Encounter for immunization: Secondary | ICD-10-CM | POA: Diagnosis present

## 2018-10-01 DIAGNOSIS — E039 Hypothyroidism, unspecified: Secondary | ICD-10-CM | POA: Diagnosis present

## 2018-10-01 DIAGNOSIS — Z8673 Personal history of transient ischemic attack (TIA), and cerebral infarction without residual deficits: Secondary | ICD-10-CM

## 2018-10-01 DIAGNOSIS — Z823 Family history of stroke: Secondary | ICD-10-CM

## 2018-10-01 DIAGNOSIS — R634 Abnormal weight loss: Secondary | ICD-10-CM | POA: Diagnosis present

## 2018-10-01 DIAGNOSIS — Z809 Family history of malignant neoplasm, unspecified: Secondary | ICD-10-CM

## 2018-10-01 DIAGNOSIS — I10 Essential (primary) hypertension: Secondary | ICD-10-CM

## 2018-10-01 DIAGNOSIS — E876 Hypokalemia: Secondary | ICD-10-CM | POA: Diagnosis present

## 2018-10-01 DIAGNOSIS — Z8249 Family history of ischemic heart disease and other diseases of the circulatory system: Secondary | ICD-10-CM

## 2018-10-01 DIAGNOSIS — Z79899 Other long term (current) drug therapy: Secondary | ICD-10-CM

## 2018-10-01 DIAGNOSIS — Z7982 Long term (current) use of aspirin: Secondary | ICD-10-CM

## 2018-10-01 DIAGNOSIS — Z7984 Long term (current) use of oral hypoglycemic drugs: Secondary | ICD-10-CM

## 2018-10-01 DIAGNOSIS — E785 Hyperlipidemia, unspecified: Secondary | ICD-10-CM | POA: Diagnosis present

## 2018-10-01 DIAGNOSIS — C20 Malignant neoplasm of rectum: Secondary | ICD-10-CM | POA: Diagnosis present

## 2018-10-01 DIAGNOSIS — Z85048 Personal history of other malignant neoplasm of rectum, rectosigmoid junction, and anus: Secondary | ICD-10-CM

## 2018-10-01 DIAGNOSIS — E1142 Type 2 diabetes mellitus with diabetic polyneuropathy: Secondary | ICD-10-CM

## 2018-10-01 DIAGNOSIS — E119 Type 2 diabetes mellitus without complications: Secondary | ICD-10-CM | POA: Diagnosis not present

## 2018-10-01 DIAGNOSIS — Z20828 Contact with and (suspected) exposure to other viral communicable diseases: Secondary | ICD-10-CM | POA: Diagnosis present

## 2018-10-01 DIAGNOSIS — R1310 Dysphagia, unspecified: Secondary | ICD-10-CM | POA: Diagnosis present

## 2018-10-01 DIAGNOSIS — E1151 Type 2 diabetes mellitus with diabetic peripheral angiopathy without gangrene: Secondary | ICD-10-CM | POA: Diagnosis present

## 2018-10-01 DIAGNOSIS — Z79890 Hormone replacement therapy (postmenopausal): Secondary | ICD-10-CM

## 2018-10-01 DIAGNOSIS — I739 Peripheral vascular disease, unspecified: Secondary | ICD-10-CM | POA: Diagnosis not present

## 2018-10-01 DIAGNOSIS — E872 Acidosis: Secondary | ICD-10-CM | POA: Diagnosis present

## 2018-10-01 DIAGNOSIS — B370 Candidal stomatitis: Secondary | ICD-10-CM | POA: Diagnosis present

## 2018-10-01 DIAGNOSIS — R627 Adult failure to thrive: Secondary | ICD-10-CM | POA: Diagnosis present

## 2018-10-01 DIAGNOSIS — R531 Weakness: Secondary | ICD-10-CM | POA: Diagnosis not present

## 2018-10-01 DIAGNOSIS — Z87891 Personal history of nicotine dependence: Secondary | ICD-10-CM

## 2018-10-01 DIAGNOSIS — E860 Dehydration: Secondary | ICD-10-CM | POA: Diagnosis present

## 2018-10-01 DIAGNOSIS — E871 Hypo-osmolality and hyponatremia: Secondary | ICD-10-CM | POA: Diagnosis present

## 2018-10-01 DIAGNOSIS — D649 Anemia, unspecified: Secondary | ICD-10-CM | POA: Diagnosis present

## 2018-10-01 DIAGNOSIS — K222 Esophageal obstruction: Secondary | ICD-10-CM | POA: Diagnosis present

## 2018-10-01 DIAGNOSIS — Z7902 Long term (current) use of antithrombotics/antiplatelets: Secondary | ICD-10-CM

## 2018-10-01 DIAGNOSIS — H2511 Age-related nuclear cataract, right eye: Secondary | ICD-10-CM | POA: Diagnosis present

## 2018-10-01 DIAGNOSIS — N179 Acute kidney failure, unspecified: Secondary | ICD-10-CM | POA: Diagnosis present

## 2018-10-01 DIAGNOSIS — K209 Esophagitis, unspecified: Secondary | ICD-10-CM | POA: Diagnosis not present

## 2018-10-01 DIAGNOSIS — K219 Gastro-esophageal reflux disease without esophagitis: Secondary | ICD-10-CM | POA: Diagnosis present

## 2018-10-01 DIAGNOSIS — K2210 Ulcer of esophagus without bleeding: Secondary | ICD-10-CM | POA: Diagnosis present

## 2018-10-01 DIAGNOSIS — Z90710 Acquired absence of both cervix and uterus: Secondary | ICD-10-CM

## 2018-10-01 LAB — PHOSPHORUS: Phosphorus: 7.4 mg/dL — ABNORMAL HIGH (ref 2.5–4.6)

## 2018-10-01 LAB — CBC WITH DIFFERENTIAL/PLATELET
Abs Immature Granulocytes: 0.01 10*3/uL (ref 0.00–0.07)
Basophils Absolute: 0 10*3/uL (ref 0.0–0.1)
Basophils Relative: 0 %
Eosinophils Absolute: 0 10*3/uL (ref 0.0–0.5)
Eosinophils Relative: 1 %
HCT: 34.3 % — ABNORMAL LOW (ref 36.0–46.0)
Hemoglobin: 11.6 g/dL — ABNORMAL LOW (ref 12.0–15.0)
Immature Granulocytes: 0 %
Lymphocytes Relative: 7 %
Lymphs Abs: 0.4 10*3/uL — ABNORMAL LOW (ref 0.7–4.0)
MCH: 29.1 pg (ref 26.0–34.0)
MCHC: 33.8 g/dL (ref 30.0–36.0)
MCV: 86 fL (ref 80.0–100.0)
Monocytes Absolute: 0.4 10*3/uL (ref 0.1–1.0)
Monocytes Relative: 7 %
Neutro Abs: 4.4 10*3/uL (ref 1.7–7.7)
Neutrophils Relative %: 85 %
Platelets: 348 10*3/uL (ref 150–400)
RBC: 3.99 MIL/uL (ref 3.87–5.11)
RDW: 14.1 % (ref 11.5–15.5)
WBC: 5.2 10*3/uL (ref 4.0–10.5)
nRBC: 0 % (ref 0.0–0.2)

## 2018-10-01 LAB — URINALYSIS, ROUTINE W REFLEX MICROSCOPIC
Bilirubin Urine: NEGATIVE
Glucose, UA: NEGATIVE mg/dL
Ketones, ur: NEGATIVE mg/dL
Nitrite: NEGATIVE
Protein, ur: NEGATIVE mg/dL
Specific Gravity, Urine: 1.016 (ref 1.005–1.030)
pH: 5 (ref 5.0–8.0)

## 2018-10-01 LAB — CK: Total CK: 132 U/L (ref 38–234)

## 2018-10-01 LAB — COMPREHENSIVE METABOLIC PANEL
ALT: 15 U/L (ref 0–44)
AST: 22 U/L (ref 15–41)
Albumin: 4.4 g/dL (ref 3.5–5.0)
Alkaline Phosphatase: 91 U/L (ref 38–126)
Anion gap: 21 — ABNORMAL HIGH (ref 5–15)
BUN: 156 mg/dL — ABNORMAL HIGH (ref 8–23)
CO2: 22 mmol/L (ref 22–32)
Calcium: 10.3 mg/dL (ref 8.9–10.3)
Chloride: 87 mmol/L — ABNORMAL LOW (ref 98–111)
Creatinine, Ser: 4.21 mg/dL — ABNORMAL HIGH (ref 0.44–1.00)
GFR calc Af Amer: 12 mL/min — ABNORMAL LOW (ref >60)
GFR calc non Af Amer: 10 mL/min — ABNORMAL LOW (ref >60)
Glucose, Bld: 316 mg/dL — ABNORMAL HIGH (ref 70–99)
Potassium: 2.4 mmol/L — CL (ref 3.5–5.1)
Sodium: 130 mmol/L — ABNORMAL LOW (ref 135–145)
Total Bilirubin: 0.8 mg/dL (ref 0.3–1.2)
Total Protein: 8.2 g/dL — ABNORMAL HIGH (ref 6.5–8.1)

## 2018-10-01 LAB — MAGNESIUM: Magnesium: 2.6 mg/dL — ABNORMAL HIGH (ref 1.7–2.4)

## 2018-10-01 MED ORDER — POTASSIUM CHLORIDE 10 MEQ/100ML IV SOLN
10.0000 meq | INTRAVENOUS | Status: AC
  Administered 2018-10-01 – 2018-10-02 (×2): 10 meq via INTRAVENOUS
  Filled 2018-10-01 (×2): qty 100

## 2018-10-01 MED ORDER — ALUM & MAG HYDROXIDE-SIMETH 200-200-20 MG/5ML PO SUSP
30.0000 mL | Freq: Once | ORAL | Status: AC
  Administered 2018-10-01: 30 mL via ORAL
  Filled 2018-10-01: qty 30

## 2018-10-01 MED ORDER — LIDOCAINE VISCOUS HCL 2 % MT SOLN
15.0000 mL | Freq: Once | OROMUCOSAL | Status: AC
  Administered 2018-10-01: 15 mL via ORAL
  Filled 2018-10-01: qty 15

## 2018-10-01 MED ORDER — SODIUM CHLORIDE 0.9 % IV SOLN
1.0000 g | Freq: Once | INTRAVENOUS | Status: AC
  Administered 2018-10-01: 1 g via INTRAVENOUS
  Filled 2018-10-01: qty 10

## 2018-10-01 MED ORDER — SODIUM CHLORIDE 0.9 % IV BOLUS
500.0000 mL | Freq: Once | INTRAVENOUS | Status: AC
  Administered 2018-10-01: 500 mL via INTRAVENOUS

## 2018-10-01 MED ORDER — SODIUM CHLORIDE 0.9 % IV BOLUS
500.0000 mL | Freq: Once | INTRAVENOUS | Status: AC
  Administered 2018-10-01: 21:00:00 500 mL via INTRAVENOUS

## 2018-10-01 MED ORDER — MAGIC MOUTHWASH
5.0000 mL | Freq: Three times a day (TID) | ORAL | Status: DC | PRN
  Filled 2018-10-01: qty 5

## 2018-10-01 NOTE — H&P (Signed)
Nicole Sellers VZC:[id number] DOB: January 16, 1950 DOA: 10/01/2018     PCP: Reynold Bowen, MD   Outpatient Specialists:   CARDS:   Dr. Gwenlyn Found  Oncology   Dr. Burr Medico GI  Dr.Mann  Surgery  Dr. Marcello Moores Patient arrived to ER on 10/01/18 at 1308  Patient coming from: home Lives alone,      Chief Complaint:  Chief Complaint  Patient presents with  . Failure To Thrive    HPI: Nicole Sellers is a 68 y.o. female with medical history significant of Colon CA, anemia, clotting disorder, lower limb ischemia stenting, GERD, HLD, HTN, hypothyroidism, Dm2, tobacco abuse    Presented with   decreased appetite for the past 3 weeks since she has undergone her surgery for colon cancer has trouble swallowing her food Having normal ostomy output no blood in stool no fevers or chills she have had generalized fatigue and decreased p.o. intake occasional mild cough but no fevers or chills Reports food having been hurting her to swallow just in the back of her throat Reports generalized fatigue, but feels weaker on the  right side    Infectious risk factors:  Reports dry cough,  In  ER RAPID COVID TEST  in house testing  Pending  Lab Results  Component Value Date   [place] 08/21/2018   [place] NEGATIVE 08/01/2018     Regarding pertinent Chronic problems:  History of rectosigmoid colon cancer diagnosed by colonoscopy by Dr. Collene Mares October 04, 2017 shown to be invasive adenocarcinoma.  Staging showed multiple pulmonary nodules Patient underwent neoadjuvant chemotherapy response She underwent chemo or and radiation therapy completed in May 2020 In August she undergone robotic assisted proctectomy and coloanal anastomosis developing postoperative small bowel obstruction requiring readmission NG tube placement requiring TPN for nutrition This has resolved and she was able to tolerate diet at the time of discharge on 23 August    Hyperlipidemia -  on statins Lipitor   HTN on  Norvasc, maxide     PAD  - On Aspirin, statin, not on   Plavix                 -  followed by cardiology dr Gwenlyn Found who did the stenting     Peripheral neuropathy on Neurontin            DM 2 -  Lab Results  Component Value Date   HGBA1C 6.0 (H) 08/01/2018    PO meds only,     Hypothyroidism:  Lab Results  Component Value Date   TSH 2.200 08/10/2017   on synthroid     While in ER: K 2.4  The following Work up has been ordered so far:  Orders Placed This Encounter  Procedures  . Urine culture  . SARS Coronavirus 2 [facility] order, Performed in [facility] hospital lab) Nasopharyngeal Nasopharyngeal Swab  . DG ABD ACUTE 2+V W 1V CHEST  . Comprehensive metabolic panel  . CBC with Differential  . Urinalysis, Routine w reflex microscopic  . Consult to hospitalist  ALL PATIENTS BEING ADMITTED/HAVING PROCEDURES NEED COVID-19 SCREENING  . ED EKG    Following Medications were ordered in ER: Medications  sodium chloride 0.9 % bolus 500 mL (has no administration in time range)  potassium chloride 10 mEq in 100 mL IVPB (has no administration in time range)  cefTRIAXone (ROCEPHIN) 1 g in sodium chloride 0.9 % 100 mL IVPB (has no administration in time range)  alum & mag hydroxide-simeth (MAALOX/MYLANTA) 200-200-20  MG/5ML suspension 30 mL (30 mLs Oral Given 10/01/18 2032)    And  lidocaine (XYLOCAINE) 2 % viscous mouth solution 15 mL (15 mLs Oral Given 10/01/18 2032)  sodium chloride 0.9 % bolus 500 mL (500 mLs Intravenous New Bag/Given 10/01/18 2031)        Consult Orders  (From admission, onward)         Start     Ordered   10/01/18 2134  Consult to hospitalist  ALL PATIENTS BEING ADMITTED/HAVING PROCEDURES NEED COVID-19 SCREENING  Once    Comments: ALL PATIENTS BEING ADMITTED/HAVING PROCEDURES NEED COVID-19 SCREENING  Provider:  (Not yet assigned)  Question Answer Comment  Place call to: [facility]   Reason for Consult Admit      10/01/18 2134            Significant initial  Findings: Abnormal Labs Reviewed  COMPREHENSIVE METABOLIC PANEL - Abnormal; Notable for the following components:      Result Value   Sodium 130 (*)    Potassium 2.4 (*)    Chloride 87 (*)    Glucose, Bld 316 (*)    BUN 156 (*)    Creatinine, Ser 4.21 (*)    Total Protein 8.2 (*)    GFR calc non Af Amer 10 (*)    GFR calc Af Amer 12 (*)    Anion gap 21 (*)    All other components within normal limits  CBC WITH DIFFERENTIAL/PLATELET - Abnormal; Notable for the following components:   Hemoglobin 11.6 (*)    HCT 34.3 (*)    Lymphs Abs 0.4 (*)    All other components within normal limits  URINALYSIS, ROUTINE W REFLEX MICROSCOPIC - Abnormal; Notable for the following components:   APPearance HAZY (*)    Hgb urine dipstick MODERATE (*)    Leukocytes,Ua MODERATE (*)    Bacteria, UA RARE (*)    All other components within normal limits     Otherwise labs showing:    Recent Labs  Lab 10/01/18 2034  NA 130*  K 2.4*  CO2 22  GLUCOSE 316*  BUN 156*  CREATININE 4.21*  CALCIUM 10.3    Cr   Up from baseline see below Lab Results  Component Value Date   CREATININE 4.21 (H) 10/01/2018   CREATININE 0.97 08/31/2018   CREATININE 1.14 (H) 08/30/2018    Recent Labs  Lab 10/01/18 2034  AST 22  ALT 15  ALKPHOS 91  BILITOT 0.8  PROT 8.2*  ALBUMIN 4.4   Lab Results  Component Value Date   CALCIUM 10.3 10/01/2018   PHOS 3.6 08/30/2018      WBC      Component Value Date/Time   WBC 5.2 10/01/2018 2034   ANC    Component Value Date/Time   NEUTROABS 4.4 10/01/2018 2034   NEUTROABS 2.6 04/27/2017 1548   ALC No components found for: LYMPHAB    Plt: Lab Results  Component Value Date   PLT 348 10/01/2018      COVID-19 Labs    Lab Results  Component Value Date   SARSCOV2NAA NEGATIVE 08/21/2018   [place] NEGATIVE 08/01/2018    HG/HCT  stable,      Component Value Date/Time   HGB 11.6 (L) 10/01/2018 2034   HGB 10.0 (L)  07/09/2018 1321   HGB 8.7 (L) 08/10/2017 1125   HCT 34.3 (L) 10/01/2018 2034   HCT 26.6 (L) 08/10/2017 1125      ECG: Ordered Personally reviewed by me  showing: HR : 68 Rhythm:  NSR,    no evidence of ischemic changes QTC 478   DM  labs:  HbA1C: Recent Labs    11/22/17 0928 08/01/18 1022  HGBA1C 6.3* 6.0*     CBG (last 3)  No results for input(s): GLUCAP in the last 72 hours.     UA  evidence of UTI      Urine analysis:    Component Value Date/Time   COLORURINE YELLOW 10/01/2018 1936   APPEARANCEUR HAZY (A) 10/01/2018 1936   LABSPEC 1.016 10/01/2018 1936   PHURINE 5.0 10/01/2018 1936   GLUCOSEU NEGATIVE 10/01/2018 1936   HGBUR MODERATE (A) 10/01/2018 1936   BILIRUBINUR NEGATIVE 10/01/2018 1936   BILIRUBINUR negative 11/27/2014 1052   KETONESUR NEGATIVE 10/01/2018 1936   PROTEINUR NEGATIVE 10/01/2018 1936   UROBILINOGEN 0.2 11/27/2014 1052   NITRITE NEGATIVE 10/01/2018 1936   LEUKOCYTESUR MODERATE (A) 10/01/2018 1936      Ordered    CXR/KUB - NON acute     ED Triage Vitals [10/01/18 1324]  Enc Vitals Group     BP 117/77     Pulse Rate 82     Resp 16     Temp 97.8 F (36.6 C)     Temp Source Oral     SpO2 100 %     Weight 156 lb (70.8 kg)     Height 5\' 6"  (1.676 m)     Head Circumference      Peak Flow      Pain Score 10     Pain Loc      Pain Edu?      Excl. in [place]?   KCLE(75)@       Latest  Blood pressure 120/77, pulse 69, temperature 98.4 F (36.9 C), temperature source Oral, resp. rate 18, height 5\' 6"  (1.676 m), weight 70.8 kg, SpO2 100 %.     Hospitalist was called for admission for aki   Review of Systems:    Pertinent positives include: decreased PO intake  Constitutional:  No weight loss, night sweats, Fevers, chills, fatigue, weight loss  HEENT:  No headaches, Difficulty swallowing,Tooth/dental problems,Sore throat,  No sneezing, itching, ear ache, nasal congestion, post nasal drip,  Cardio-vascular:  No chest pain,  Orthopnea, PND, anasarca, dizziness, palpitations.no Bilateral lower extremity swelling  GI:  No heartburn, indigestion, abdominal pain, nausea, vomiting, diarrhea, change in bowel habits, loss of appetite, melena, blood in stool, hematemesis Resp:  no shortness of breath at rest. No dyspnea on exertion, No excess mucus, no productive cough, No non-productive cough, No coughing up of blood.No change in color of mucus.No wheezing. Skin:  no rash or lesions. No jaundice GU:  no dysuria, change in color of urine, no urgency or frequency. No straining to urinate.  No flank pain.  Musculoskeletal:  No joint pain or no joint swelling. No decreased range of motion. No back pain.  Psych:  No change in mood or affect. No depression or anxiety. No memory loss.  Neuro: no localizing neurological complaints, no tingling, no weakness, no double vision, no gait abnormality, no slurred speech, no confusion  All systems reviewed and apart from [place] all are negative  Past Medical History:   Past Medical History:  Diagnosis Date  . Age-related nuclear cataract, right eye   . Allergy   . Anemia   . Arthritis    "joints sometimes" (12/25/2012)  . Clotting disorder ([place])   . Colon cancer ([place])   . Critical  lower limb ischemia   . Diverticulosis   . Gangrene of toe, Rt second toe 12/25/2012  . GERD (gastroesophageal reflux disease)   . High cholesterol   . Hypertension   . Hypothyroidism   . PAD (peripheral artery disease) ([place])   . PAD (peripheral artery disease) ([place])   . Peripheral neuropathy   . Rectal cancer ([place])   . S/P arterial stent, 12/25/13, successful diamondback orbital rotational arthrectomy, PTA using chocolate  balloon and stenting using I DEV stent of long segment calcified high-grade proximal and mid r 12/25/2012  . Tobacco abuse   . Type II diabetes mellitus ([place])        Past Surgical History:  Procedure Laterality Date  . ABDOMINAL AORTAGRAM  12/20/2012   Procedure:  ABDOMINAL AORTAGRAM;  Surgeon: Lorretta Harp, MD;  Location: [facility] CATH LAB;  Service: Cardiovascular;;  . ABDOMINAL HYSTERECTOMY  2000  . ANGIOPLASTY / STENTING FEMORAL Right 12/25/2012  . ATHERECTOMY Right 12/25/2012   Procedure: ATHERECTOMY;  Surgeon: Lorretta Harp, MD;  Location: [facility] CATH LAB;  Service: Cardiovascular;  Laterality: Right;  right SFA  . COLONOSCOPY    . DIVERTING ILEOSTOMY N/A 08/03/2018   Procedure: DIVERTING LOOP ILEOSTOMY;  Surgeon: Leighton Ruff, MD;  Location: [facility] ORS;  Service: General;  Laterality: N/A;  . EYE SURGERY Left    cataract  . HERNIA REPAIR    . LOWER EXTREMITY ANGIOGRAM N/A 12/20/2012   Procedure: LOWER EXTREMITY ANGIOGRAM;  Surgeon: Lorretta Harp, MD;  Location: [facility] CATH LAB;  Service: Cardiovascular;  Laterality: N/A;  . LOWER EXTREMITY ANGIOGRAM N/A 12/25/2012   Procedure: LOWER EXTREMITY ANGIOGRAM;  Surgeon: Lorretta Harp, MD;  Location: [facility] CATH LAB;  Service: Cardiovascular;  Laterality: N/A;  . LOWER EXTREMITY INTERVENTION  05/08/2017  . LOWER EXTREMITY INTERVENTION Bilateral 05/08/2017   Procedure: LOWER EXTREMITY INTERVENTION;  Surgeon: Lorretta Harp, MD;  Location: [facility] CV LAB;  Service: Cardiovascular;  Laterality: Bilateral;  . PERIPHERAL VASCULAR BALLOON ANGIOPLASTY Right 05/08/2017   Procedure: PERIPHERAL VASCULAR BALLOON ANGIOPLASTY;  Surgeon: Lorretta Harp, MD;  Location: [place] CV LAB;  Service: Cardiovascular;  Laterality: Right;  sfa  . PERIPHERAL VASCULAR INTERVENTION Right 05/08/2017   Procedure: PERIPHERAL VASCULAR INTERVENTION;  Surgeon: Lorretta Harp, MD;  Location: [place] CV LAB;  Service: Cardiovascular;  Laterality: Right;  ext iliac  . PORTACATH PLACEMENT Right 11/22/2017   Procedure: INSERTION PORT-A-CATH;  Surgeon: Leighton Ruff, MD;  Location: [facility] ORS;  Service: General;  Laterality: Right;  . TOE SURGERY Right    2n toe   . UMBILICAL HERNIA REPAIR  2000  . UMBILICAL HERNIA REPAIR N/A  08/03/2018   Procedure: UMBILICAL HERNIA REPAIR;  Surgeon: Leighton Ruff, MD;  Location: [facility] ORS;  Service: General;  Laterality: N/A;  . XI ROBOTIC ASSISTED LOWER ANTERIOR RESECTION N/A 08/03/2018   Procedure: XI ROBOTIC ASSISTED LOWER ANTERIOR RESECTION; DIAGNOSTIC FLEXIBLE SIGMOIDOSCOPY; INTRAOPERATIVE ASSESSMENT OF VASCULAR PERFUSION;  Surgeon: Leighton Ruff, MD;  Location: [facility] ORS;  Service: General;  Laterality: N/A;    Social History:  Ambulatory  cane,      reports that she has quit smoking. Her smoking use included cigarettes. She has a 24.00 pack-year smoking history. She has never used smokeless tobacco. She reports that she does not drink alcohol or use drugs.     Family History:   Family History  Problem Relation Age of Onset  . Hypertension Mother   . Stroke Mother   . Cancer Father  prostate  . Mental illness Sister   . Diabetes Brother   . Hypertension Brother   . Cancer Brother 3       prostate  . Diabetes Brother   . Hypertension Brother   . Cancer Brother        "tumors on neck and body"     Allergies: Allergies  Allergen Reactions  . Kiwi Extract Itching    Throat itching   . Lisinopril     Bruising      Prior to Admission medications   Medication Sig Start Date End Date Taking? Authorizing Provider  acetaminophen (TYLENOL) 500 MG tablet Take 500 mg by mouth 2 (two) times daily as needed for moderate pain or headache.     [provider]  albuterol (PROVENTIL HFA;VENTOLIN HFA) 108 (90 BASE) MCG/ACT inhaler Inhale 2 puffs into the lungs every 6 (six) hours as needed for wheezing or shortness of breath.    [provider]  amLODipine (NORVASC) 5 MG tablet TAKE 1 TABLET BY MOUTH EVERY DAY Patient taking differently: Take 5 mg by mouth daily.  04/16/18   Lorretta Harp, MD  aspirin 81 MG tablet Take 81 mg by mouth daily.    [provider]  atorvastatin (LIPITOR) 10 MG tablet Take 1 tablet (10 mg total) by mouth  daily. 01/01/18   Minette Brine, FNP  Calcium Carbonate-Vitamin D (CALCIUM 600+D PO) Take 1 tablet by mouth daily.    [provider]  cetirizine (ZYRTEC) 10 MG tablet Take 10 mg by mouth daily.    [provider]  cholecalciferol (VITAMIN D) 1000 units tablet Take 4,000 Units by mouth daily.     [provider]  clopidogrel (PLAVIX) 75 MG tablet Take 1 tablet (75 mg total) by mouth daily with breakfast. Patient not taking: Reported on 08/01/2018 10/13/17   Lorretta Harp, MD  FERREX 150 150 MG capsule Take 150 mg by mouth 2 (two) times daily. 08/29/17   [provider]  gabapentin (NEURONTIN) 300 MG capsule Take 300 mg by mouth 2 (two) times daily.     [provider]  glimepiride (AMARYL) 1 MG tablet Take 1 mg by mouth 2 (two) times daily.    [provider]  levothyroxine (SYNTHROID, LEVOTHROID) 50 MCG tablet Take 1 tablet (50 mcg total) by mouth daily. 01/01/18   Minette Brine, FNP  lidocaine-prilocaine (EMLA) cream Apply to affected area once 11/18/17   Truitt Merle, MD  metFORMIN (GLUCOPHAGE-XR) 500 MG 24 hr tablet TAKE 2 TABLETS BY MOUTH  TWICE A DAY Patient taking differently: Take 1,000 mg by mouth 2 (two) times a day. Give w/food. 12/27/17   Minette Brine, FNP  mirtazapine (REMERON) 7.5 MG tablet Take 2 tablets (15 mg total) by mouth at bedtime. Patient taking differently: Take 15 mg by mouth at bedtime as needed (sleep).  02/26/18   Truitt Merle, MD  Misc Natural Products (OSTEO BI-FLEX ADV TRIPLE ST) TABS Take 1 tablet by mouth 2 (two) times daily.    [provider]  Multiple Vitamin (MULTIVITAMIN) capsule Take 1 capsule by mouth daily.    [provider]  ondansetron (ZOFRAN) 8 MG tablet Take 1 tablet (8 mg total) by mouth 2 (two) times daily as needed for refractory nausea / vomiting. Start on day 3 after chemotherapy. 11/18/17   Truitt Merle, MD  polycarbophil (FIBERCON) 625 MG tablet Take 1 tablet (625 mg total) by mouth 3  (three) times daily. 7/51/02   Leighton Ruff,  MD  prochlorperazine (COMPAZINE) 10 MG tablet Take 1 tablet (10 mg total) by mouth every 6 (six) hours as needed (Nausea or vomiting). 11/18/17   Truitt Merle, MD  tetrahydrozoline-zinc (VISINE-AC) 0.05-0.25 % ophthalmic solution Place 1 drop into both eyes 2 (two) times daily as needed (dry eyes).    [provider]  Triamcinolone Acetonide (NASACORT ALLERGY 24HR NA) Place 1 spray into the nose daily as needed (allergies).    [provider]  triamterene-hydrochlorothiazide (MAXZIDE) 75-50 MG tablet Take 1 tablet by mouth daily.    [provider]  Trolamine Salicylate (ASPERCREME EX) Apply 1 application topically daily as needed (joint pain).    [provider]  vitamin B-12 (CYANOCOBALAMIN) 500 MCG tablet Take 500 mcg by mouth daily.    [provider]   Physical Exam: Blood pressure 120/77, pulse 69, temperature 98.4 F (36.9 C), temperature source Oral, resp. rate 18, height 5\' 6"  (1.676 m), weight 70.8 kg, SpO2 100 %. 1. General:  in No Acute distress    Chronically ill  -appearing 2. Psychological: Alert and  Oriented 3. Head/ENT:   Dry Mucous Membranes                          Head Non traumatic, neck supple                            Poor Dentition 4. SKIN:  decreased Skin turgor,  Skin clean Dry and intact no rash 5. Heart: Regular rate and rhythm no  Murmur, no Rub or gallop 6. Lungs:   no wheezes or crackles   7. Abdomen: Soft,  non-tender,   distended    obese  bowel sounds present 8. Lower extremities: no clubbing, cyanosis, no  edema 9. Neurologically   strength 5 out of 5 in all 4 extremities cranial nerves II through XII intact 10. MSK: Normal range of motion   All other LABS:     Recent Labs  Lab 10/01/18 2034  WBC 5.2  NEUTROABS 4.4  HGB 11.6*  HCT 34.3*  MCV 86.0  PLT 348     Recent Labs  Lab 10/01/18 2034  NA 130*  K 2.4*  CL 87*  CO2 22  GLUCOSE 316*  BUN 156*   CREATININE 4.21*  CALCIUM 10.3     Recent Labs  Lab 10/01/18 2034  AST 22  ALT 15  ALKPHOS 91  BILITOT 0.8  PROT 8.2*  ALBUMIN 4.4       Cultures: No results found for: SDES, SPECREQUEST, CULT, REPTSTATUS   Radiological Exams on Admission: Dg Abd Acute 2+v W 1v Chest  Result Date: 10/01/2018 CLINICAL DATA:  Cough, abdominal distention EXAM: DG ABDOMEN ACUTE W/ 1V CHEST COMPARISON:  11/22/2017 chest x-ray.  Abdominal film 08/28/2018 FINDINGS: Right Port-A-Cath in place with the tip in the SVC, unchanged. Lungs clear. Heart is normal size. No effusions. Nonobstructive bowel gas pattern. No organomegaly, free air or suspicious calcification. Vascular calcifications noted. No acute bony abnormality. IMPRESSION: No acute cardiopulmonary disease. No evidence of bowel obstruction or free air. Electronically Signed   By: Rolm Baptise M.D.   On: 10/01/2018 21:09    Chart has been reviewed   Assessment/Plan  68 y.o. female with medical history significant of Colon CA, anemia, clotting disorder, lower limb ischemia stenting, GERD, HLD, HTN, hypothyroidism, Dm2, tobacco abuse     Admitted for dehydration secondary to  odynophagia resulting in AKI  Present on Admission:  . Dehydration - due to decreased Po intake   . AKI (acute kidney injury) ([place]) -due to dehydration but will obtain urine electrolytes, given severity and history of cancer will obtain renal ultrasound To evaluate for any evidence of hydronephrosis  . Hypokalemia - - will replace and repeat in AM,  check magnesium level and replace as needed  . Pyuria -started on Rocephin in the emergency department obtain urine cultures continue to follow for now continue antibiotics and reassess  . Hyponatremia -in the setting of dehydration obtain urine electrolytes rehydrate and recheck  . Essential hypertension -given significant dehydration will allow some permissive hypertension given some soft blood pressures   . PAD  (peripheral artery disease) Rt ABI 0.7, Lt ABI 0.46 -patient is no longer on Plavix states she did not tolerate it,   seems to be stable   . Rectal cancer [facility]) -notify oncology patient has been admitted   Right side weaknes - per patient - given hx of CA will obtain imaging   Dm2-  - Order Sensitive  SSI   -  check TSH and HgA1C  - Hold by mouth medications    Odynophasia - magic mouth wash, SLP assessment, urgency department patient showing gum and lozenges states it makes her feel better No thrush visiallized may need Gi consult if persists  Self-reported right side weakness. -Patient was somewhat unsure which side when she weak on physical exam no weakness noted.  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  DVT prophylaxis:  SCD    Code Status:  FULL CODE as per patient   I had personally discussed CODE STATUS with patient  Family Communication:   Family not at  Bedside    Disposition Plan:  To home once workup is complete and patient is stable                     Would benefit from PT/OT eval prior to DC  Ordered                   Swallow eval - SLP ordered                                       Nutrition    consulted                                    Consults called:   Notify oncology pt has been admitted thorough email  Admission status:  ED Disposition    None         inpatient     Expect 2 midnight stay secondary to severity of patient's current illness including    Severe lab/radiological/exam abnormalities including:  AKI, hypokalemia   and extensive comorbidities including:  DM2   malignancy,   That are currently affecting medical management.   I expect  patient to be hospitalized for 2 midnights requiring inpatient medical care.  Patient is at high risk for adverse  outcome (such as loss of life or disability) if not treated.  Indication for inpatient stay  as follows:    inability to maintain oral hydration      Need for IV antibiotics, IV fluids,      Level of care    tele    24H    Precautions:  NONE  No active isolations  PPE: Used by the provider:   P100  eye Goggles,  Gloves    Demetrus Pavao 10/02/2018, 12:58 AM    [facility]     after 2 AM please page floor coverage PA If 7AM-7PM, please contact the day team taking care of the patient using [email or web address]

## 2018-10-01 NOTE — ED Triage Notes (Signed)
Pt verbalizes "just don't want to eat" for past 3 weeks since "colon surgery for my cancer." "The put something down my throat and now I seriously cant swallow."

## 2018-10-01 NOTE — ED Provider Notes (Signed)
[facility] DEPT Provider Note   CSN: [id number] Arrival date & time: 10/01/18  1308     History   Chief Complaint Chief Complaint  Patient presents with  . Failure To Thrive    HPI ELLEIGH CASSETTA is a 68 y.o. female.     HPI In July patient had proctectomy and coloanal anastomosis with a diverting ileostomy.  Developed a small bowel obstruction in August.  Patient states for last few weeks she has had difficulty tolerating any oral intake.  States that she has trouble swallowing.  Continues to have normal ostomy output.  No blood in the stool.  No fever or chills.  Patient states she is had decreased energy and generalized fatigue.  Also admits to mild cough.  No fever or chills. Past Medical History:  Diagnosis Date  . Age-related nuclear cataract, right eye   . Allergy   . Anemia   . Arthritis    "joints sometimes" (12/25/2012)  . Clotting disorder ([place])   . Colon cancer ([place])   . Critical lower limb ischemia   . Diverticulosis   . Gangrene of toe, Rt second toe 12/25/2012  . GERD (gastroesophageal reflux disease)   . High cholesterol   . Hypertension   . Hypothyroidism   . PAD (peripheral artery disease) ([place])   . PAD (peripheral artery disease) ([place])   . Peripheral neuropathy   . Rectal cancer ([place])   . S/P arterial stent, 12/25/13, successful diamondback orbital rotational arthrectomy, PTA using chocolate  balloon and stenting using I DEV stent of long segment calcified high-grade proximal and mid r 12/25/2012  . Tobacco abuse   . Type II diabetes mellitus [facility])     Patient Active Problem List   Diagnosis Date Noted  . SBO (small bowel obstruction) ([place]) 08/22/2018  . Dehydration 08/21/2018  . Pre-operative cardiovascular examination 06/07/2018  . Rectal cancer ([place]) 11/06/2017  . Claudication in peripheral vascular disease ([place]) 05/08/2017  . Carotid artery disease ([place]) 04/14/2017  . PAD (peripheral artery disease) Rt ABI  0.7, Lt ABI 0.46 12/25/2012  . S/P arterial stent, 12/25/13, successful diamondback orbital rotational arthrectomy, PTA using chocolate  balloon and stenting using I DEV stent of long segment calcified high-grade proximal and mid r 12/25/2012  . Gangrene of toe, Rt second toe 12/25/2012  . Essential hypertension 12/13/2012  . Non-insulin treated type 2 diabetes mellitus ([place]) 12/13/2012  . Critical lower limb ischemia 12/13/2012  . Tobacco abuse 12/13/2012    Past Surgical History:  Procedure Laterality Date  . ABDOMINAL AORTAGRAM  12/20/2012   Procedure: ABDOMINAL AORTAGRAM;  Surgeon: Lorretta Harp, MD;  Location: [facility] CATH LAB;  Service: Cardiovascular;;  . ABDOMINAL HYSTERECTOMY  2000  . ANGIOPLASTY / STENTING FEMORAL Right 12/25/2012  . ATHERECTOMY Right 12/25/2012   Procedure: ATHERECTOMY;  Surgeon: Lorretta Harp, MD;  Location: [facility] CATH LAB;  Service: Cardiovascular;  Laterality: Right;  right SFA  . COLONOSCOPY    . DIVERTING ILEOSTOMY N/A 08/03/2018   Procedure: DIVERTING LOOP ILEOSTOMY;  Surgeon: Leighton Ruff, MD;  Location: [facility] ORS;  Service: General;  Laterality: N/A;  . EYE SURGERY Left    cataract  . HERNIA REPAIR    . LOWER EXTREMITY ANGIOGRAM N/A 12/20/2012   Procedure: LOWER EXTREMITY ANGIOGRAM;  Surgeon: Lorretta Harp, MD;  Location: [facility] CATH LAB;  Service: Cardiovascular;  Laterality: N/A;  . LOWER EXTREMITY ANGIOGRAM N/A 12/25/2012   Procedure: LOWER EXTREMITY ANGIOGRAM;  Surgeon: Lorretta Harp, MD;  Location: [facility] CATH LAB;  Service: Cardiovascular;  Laterality: N/A;  . LOWER EXTREMITY INTERVENTION  05/08/2017  . LOWER EXTREMITY INTERVENTION Bilateral 05/08/2017   Procedure: LOWER EXTREMITY INTERVENTION;  Surgeon: Lorretta Harp, MD;  Location: [facility] CV LAB;  Service: Cardiovascular;  Laterality: Bilateral;  . PERIPHERAL VASCULAR BALLOON ANGIOPLASTY Right 05/08/2017   Procedure: PERIPHERAL VASCULAR BALLOON ANGIOPLASTY;  Surgeon: Lorretta Harp, MD;   Location: [facility] CV LAB;  Service: Cardiovascular;  Laterality: Right;  sfa  . PERIPHERAL VASCULAR INTERVENTION Right 05/08/2017   Procedure: PERIPHERAL VASCULAR INTERVENTION;  Surgeon: Lorretta Harp, MD;  Location: [facility] CV LAB;  Service: Cardiovascular;  Laterality: Right;  ext iliac  . PORTACATH PLACEMENT Right 11/22/2017   Procedure: INSERTION PORT-A-CATH;  Surgeon: Leighton Ruff, MD;  Location: [facility] ORS;  Service: General;  Laterality: Right;  . TOE SURGERY Right    2n toe   . UMBILICAL HERNIA REPAIR  2000  . UMBILICAL HERNIA REPAIR N/A 08/03/2018   Procedure: UMBILICAL HERNIA REPAIR;  Surgeon: Leighton Ruff, MD;  Location: [facility] ORS;  Service: General;  Laterality: N/A;  . XI ROBOTIC ASSISTED LOWER ANTERIOR RESECTION N/A 08/03/2018   Procedure: XI ROBOTIC ASSISTED LOWER ANTERIOR RESECTION; DIAGNOSTIC FLEXIBLE SIGMOIDOSCOPY; INTRAOPERATIVE ASSESSMENT OF VASCULAR PERFUSION;  Surgeon: Leighton Ruff, MD;  Location: [facility] ORS;  Service: General;  Laterality: N/A;     OB History   No obstetric history on file.      Home Medications    Prior to Admission medications   Medication Sig Start Date End Date Taking? Authorizing Provider  acetaminophen (TYLENOL) 500 MG tablet Take 500 mg by mouth 2 (two) times daily as needed for moderate pain or headache.    Yes [provider]  amLODipine (NORVASC) 5 MG tablet TAKE 1 TABLET BY MOUTH EVERY DAY Patient taking differently: Take 5 mg by mouth daily.  04/16/18  Yes Lorretta Harp, MD  aspirin 81 MG tablet Take 81 mg by mouth daily.   Yes [provider]  atorvastatin (LIPITOR) 10 MG tablet Take 1 tablet (10 mg total) by mouth daily. 01/01/18  Yes Minette Brine, FNP  Calcium Carbonate-Vitamin D (CALCIUM 600+D PO) Take 1 tablet by mouth daily.   Yes [provider]  cetirizine (ZYRTEC) 10 MG tablet Take 10 mg by mouth daily.   Yes [provider]  cholecalciferol (VITAMIN D) 1000 units tablet Take 4,000  Units by mouth daily.    Yes [provider]  FERREX 150 150 MG capsule Take 150 mg by mouth 2 (two) times daily. 08/29/17  Yes [provider]  gabapentin (NEURONTIN) 300 MG capsule Take 300 mg by mouth 2 (two) times daily.    Yes [provider]  glimepiride (AMARYL) 1 MG tablet Take 1 mg by mouth 2 (two) times daily.   Yes [provider]  levothyroxine (SYNTHROID, LEVOTHROID) 50 MCG tablet Take 1 tablet (50 mcg total) by mouth daily. 01/01/18  Yes Minette Brine, FNP  lidocaine-prilocaine (EMLA) cream Apply to affected area once 11/18/17  Yes Truitt Merle, MD  metFORMIN (GLUCOPHAGE-XR) 500 MG 24 hr tablet TAKE 2 TABLETS BY MOUTH  TWICE A DAY Patient taking differently: Take 1,000 mg by mouth 2 (two) times a day. Give w/food. 12/27/17  Yes Minette Brine, FNP  mirtazapine (REMERON) 7.5 MG tablet Take 2 tablets (15 mg total) by mouth at bedtime. Patient taking differently: Take 15 mg by mouth at bedtime as needed (sleep).  02/26/18  Yes Truitt Merle, MD  Misc Natural Products (OSTEO BI-FLEX ADV TRIPLE ST) TABS Take 1 tablet by mouth 2 (two) times daily.   Yes [provider]  Multiple Vitamin (MULTIVITAMIN) capsule Take 1 capsule by mouth daily.   Yes [provider]  ondansetron (ZOFRAN) 8 MG tablet Take 1 tablet (8 mg total) by mouth 2 (two) times daily as needed for refractory nausea / vomiting. Start on day 3 after chemotherapy. 11/18/17  Yes Truitt Merle, MD  polycarbophil (FIBERCON) 625 MG tablet Take 1 tablet (625 mg total) by mouth 3 (three) times daily. 1/63/84  Yes Leighton Ruff, MD  tetrahydrozoline-zinc (VISINE-AC) 0.05-0.25 % ophthalmic solution Place 1 drop into both eyes 2 (two) times daily as needed (dry eyes).   Yes [provider]  Triamcinolone Acetonide (NASACORT ALLERGY 24HR NA) Place 1 spray into the nose daily as needed (allergies).   Yes [provider]  triamterene-hydrochlorothiazide (MAXZIDE) 75-50 MG tablet Take 1  tablet by mouth daily.   Yes [provider]  Trolamine Salicylate (ASPERCREME EX) Apply 1 application topically daily as needed (joint pain).   Yes [provider]  vitamin B-12 (CYANOCOBALAMIN) 500 MCG tablet Take 500 mcg by mouth daily.   Yes [provider]  albuterol (PROVENTIL HFA;VENTOLIN HFA) 108 (90 BASE) MCG/ACT inhaler Inhale 2 puffs into the lungs every 6 (six) hours as needed for wheezing or shortness of breath.    [provider]  clopidogrel (PLAVIX) 75 MG tablet Take 1 tablet (75 mg total) by mouth daily with breakfast. Patient not taking: Reported on 10/01/2018 10/13/17   Lorretta Harp, MD  prochlorperazine (COMPAZINE) 10 MG tablet Take 1 tablet (10 mg total) by mouth every 6 (six) hours as needed (Nausea or vomiting). Patient not taking: Reported on 10/01/2018 11/18/17   Truitt Merle, MD    Family History Family History  Problem Relation Age of Onset  . Hypertension Mother   . Stroke Mother   . Cancer Father        prostate  . Mental illness Sister   . Diabetes Brother   . Hypertension Brother   . Cancer Brother 45       prostate  . Diabetes Brother   . Hypertension Brother   . Cancer Brother        "tumors on neck and body"     Social History Social History   Tobacco Use  . Smoking status: Former Smoker    Packs/day: 0.50    Years: 48.00    Pack years: 24.00    Types: Cigarettes  . Smokeless tobacco: Never Used  . Tobacco comment: quit in 2016  Substance Use Topics  . Alcohol use: No    Alcohol/week: 0.0 standard drinks  . Drug use: No     Allergies   Kiwi extract and Lisinopril   Review of Systems Review of Systems  Constitutional: Positive for fatigue. Negative for chills and fever.  HENT: Positive for trouble swallowing. Negative for sore throat.   Respiratory: Positive for cough. Negative for shortness of breath.   Cardiovascular: Negative for chest pain, palpitations and leg swelling.  Gastrointestinal:  Positive for abdominal pain. Negative for constipation, diarrhea, nausea and vomiting.  Genitourinary: Negative for dysuria, flank pain and frequency.  Musculoskeletal: Negative for back pain, joint swelling, myalgias and neck pain.  Skin: Negative for rash and wound.  Neurological: Negative for dizziness, weakness, light-headedness, numbness and headaches.  All other systems reviewed and are negative.    Physical Exam Updated Vital Signs  BP (!) 146/84   Pulse 69   Temp 98.4 F (36.9 C) (Oral)   Resp 14   Ht 5\' 6"  (1.676 m)   Wt 70.8 kg   SpO2 100%   BMI 25.18 kg/m   Physical Exam Vitals signs and nursing note reviewed.  Constitutional:      General: She is not in acute distress.    Appearance: Normal appearance. She is well-developed. She is not ill-appearing.  HENT:     Head: Normocephalic and atraumatic.     Nose: Nose normal.     Mouth/Throat:     Mouth: Mucous membranes are moist.     Pharynx: No oropharyngeal exudate or posterior oropharyngeal erythema.  Eyes:     Extraocular Movements: Extraocular movements intact.     Pupils: Pupils are equal, round, and reactive to light.  Neck:     Musculoskeletal: Normal range of motion and neck supple. No neck rigidity or muscular tenderness.  Cardiovascular:     Rate and Rhythm: Normal rate and regular rhythm.     Heart sounds: No murmur. No friction rub. No gallop.   Pulmonary:     Effort: Pulmonary effort is normal. No respiratory distress.     Breath sounds: Normal breath sounds. No stridor. No wheezing, rhonchi or rales.  Chest:     Chest wall: No tenderness.  Abdominal:     General: Bowel sounds are normal.     Palpations: Abdomen is soft.     Tenderness: There is no abdominal tenderness. There is no right CVA tenderness, left CVA tenderness, guarding or rebound.     Comments: Ostomy in place.  Mild diffuse tenderness to palpation without focality, rebound or guarding.  Musculoskeletal: Normal range of motion.         General: No swelling, tenderness, deformity or signs of injury.     Right lower leg: No edema.     Left lower leg: No edema.  Lymphadenopathy:     Cervical: No cervical adenopathy.  Skin:    General: Skin is warm and dry.     Capillary Refill: Capillary refill takes less than 2 seconds.     Findings: No erythema or rash.  Neurological:     General: No focal deficit present.     Mental Status: She is alert and oriented to person, place, and time.     Comments: Moving all extremities without focal deficit.  Sensation intact.  Psychiatric:        Behavior: Behavior normal.      ED Treatments / Results  Labs (all labs ordered are listed, but only abnormal results are displayed) Labs Reviewed  COMPREHENSIVE METABOLIC PANEL - Abnormal; Notable for the following components:      Result Value   Sodium 130 (*)    Potassium 2.4 (*)    Chloride 87 (*)    Glucose, Bld 316 (*)    BUN 156 (*)    Creatinine, Ser 4.21 (*)    Total Protein 8.2 (*)    GFR calc non Af Amer 10 (*)    GFR calc Af Amer 12 (*)    Anion gap 21 (*)    All other components within normal limits  CBC WITH DIFFERENTIAL/PLATELET - Abnormal; Notable for the following components:   Hemoglobin 11.6 (*)    HCT 34.3 (*)    Lymphs Abs 0.4 (*)    All other components within normal limits  URINALYSIS, ROUTINE W REFLEX MICROSCOPIC - Abnormal; Notable for the following  components:   APPearance HAZY (*)    Hgb urine dipstick MODERATE (*)    Leukocytes,Ua MODERATE (*)    Bacteria, UA RARE (*)    All other components within normal limits  URINE CULTURE  SARS CORONAVIRUS 2 (HOSPITAL ORDER, [facility])    EKG EKG Interpretation  Date/Time:  Monday October 01 2018 21:49:58 EDT Ventricular Rate:  68 PR Interval:    QRS Duration: 129 QT Interval:  449 QTC Calculation: 478 R Axis:   64 Text Interpretation:  Sinus rhythm Left bundle branch block Confirmed by Julianne Rice [id number]) on  10/01/2018 10:27:59 PM   Radiology Dg Abd Acute 2+v W 1v Chest  Result Date: 10/01/2018 CLINICAL DATA:  Cough, abdominal distention EXAM: DG ABDOMEN ACUTE W/ 1V CHEST COMPARISON:  11/22/2017 chest x-ray.  Abdominal film 08/28/2018 FINDINGS: Right Port-A-Cath in place with the tip in the SVC, unchanged. Lungs clear. Heart is normal size. No effusions. Nonobstructive bowel gas pattern. No organomegaly, free air or suspicious calcification. Vascular calcifications noted. No acute bony abnormality. IMPRESSION: No acute cardiopulmonary disease. No evidence of bowel obstruction or free air. Electronically Signed   By: Rolm Baptise M.D.   On: 10/01/2018 21:09    Procedures Procedures (including critical care time)  Medications Ordered in ED Medications  potassium chloride 10 mEq in 100 mL IVPB (10 mEq Intravenous New Bag/Given 10/01/18 2227)  alum & mag hydroxide-simeth (MAALOX/MYLANTA) 200-200-20 MG/5ML suspension 30 mL (30 mLs Oral Given 10/01/18 2032)    And  lidocaine (XYLOCAINE) 2 % viscous mouth solution 15 mL (15 mLs Oral Given 10/01/18 2032)  sodium chloride 0.9 % bolus 500 mL (0 mLs Intravenous Stopped 10/01/18 2155)  sodium chloride 0.9 % bolus 500 mL (500 mLs Intravenous Bolus from Bag 10/01/18 2155)  cefTRIAXone (ROCEPHIN) 1 g in sodium chloride 0.9 % 100 mL IVPB (1 g Intravenous New Bag/Given 10/01/18 2154)     Initial Impression / Assessment and Plan / ED Course  I have reviewed the triage vital signs and the nursing notes.  Pertinent labs & imaging results that were available during my care of the patient were reviewed by me and considered in my medical decision making (see chart for details).        Patient with acute renal failure.  Also appears to have evidence of UTI.  Urine sent for culture.  Will start on antibiotics.  Discussed with hospitalist who will see patient in the emergency department and admit. Final Clinical Impressions(s) / ED Diagnoses   Final diagnoses:  AKI  (acute kidney injury) ([place])  Dehydration  Hypokalemia    ED Discharge Orders    None       Julianne Rice, MD 10/01/18 2228

## 2018-10-01 NOTE — ED Notes (Signed)
ED TO INPATIENT HANDOFF REPORT  ED Nurse Name and Phone #: Gara Kroner, RN  S Name/Age/Gender Nicole Sellers 68 y.o. female Room/Bed: WA22/WA22  Code Status   Code Status: Prior  Home/SNF/Other Home Patient oriented to: self, place, time and situation Is this baseline? Yes   Triage Complete: Triage complete  Chief Complaint not eating / weakness   Triage Note Pt verbalizes "just don't want to eat" for past 3 weeks since "colon surgery for my cancer." "The put something down my throat and now I seriously cant swallow."    Allergies Allergies  Allergen Reactions  . Kiwi Extract Itching    Throat itching   . Lisinopril     Bruising     Level of Care/Admitting Diagnosis ED Disposition    ED Disposition Condition [facility] Area: [place] [[id number]]  Level of Care: Telemetry [5]  Admit to tele based on following criteria: Other see comments  Comments: hypokalemia  Covid Evaluation: Asymptomatic Screening Protocol (No Symptoms)  Diagnosis: AKI (acute kidney injury) [facility]) [[id number]]  Admitting Physician: Toy Baker [[id number]]  Attending Physician: Toy Baker [[id number]]  Estimated length of stay: 3 - 4 days  Certification:: I certify this patient will need inpatient services for at least 2 midnights  PT Class (Do Not Modify): Inpatient [101]  PT Acc Code (Do Not Modify): Private [1]       B Medical/Surgery History Past Medical History:  Diagnosis Date  . Age-related nuclear cataract, right eye   . Allergy   . Anemia   . Arthritis    "joints sometimes" (12/25/2012)  . Clotting disorder ([place])   . Colon cancer ([place])   . Critical lower limb ischemia   . Diverticulosis   . Gangrene of toe, Rt second toe 12/25/2012  . GERD (gastroesophageal reflux disease)   . High cholesterol   . Hypertension   . Hypothyroidism   . PAD (peripheral artery disease) ([place])   . PAD (peripheral artery disease) ([place])   . Peripheral  neuropathy   . Rectal cancer ([place])   . S/P arterial stent, 12/25/13, successful diamondback orbital rotational arthrectomy, PTA using chocolate  balloon and stenting using I DEV stent of long segment calcified high-grade proximal and mid r 12/25/2012  . Tobacco abuse   . Type II diabetes mellitus ([place])    Past Surgical History:  Procedure Laterality Date  . ABDOMINAL AORTAGRAM  12/20/2012   Procedure: ABDOMINAL AORTAGRAM;  Surgeon: Lorretta Harp, MD;  Location: [facility] CATH LAB;  Service: Cardiovascular;;  . ABDOMINAL HYSTERECTOMY  2000  . ANGIOPLASTY / STENTING FEMORAL Right 12/25/2012  . ATHERECTOMY Right 12/25/2012   Procedure: ATHERECTOMY;  Surgeon: Lorretta Harp, MD;  Location: [facility] CATH LAB;  Service: Cardiovascular;  Laterality: Right;  right SFA  . COLONOSCOPY    . DIVERTING ILEOSTOMY N/A 08/03/2018   Procedure: DIVERTING LOOP ILEOSTOMY;  Surgeon: Leighton Ruff, MD;  Location: [facility] ORS;  Service: General;  Laterality: N/A;  . EYE SURGERY Left    cataract  . HERNIA REPAIR    . LOWER EXTREMITY ANGIOGRAM N/A 12/20/2012   Procedure: LOWER EXTREMITY ANGIOGRAM;  Surgeon: Lorretta Harp, MD;  Location: [facility] CATH LAB;  Service: Cardiovascular;  Laterality: N/A;  . LOWER EXTREMITY ANGIOGRAM N/A 12/25/2012   Procedure: LOWER EXTREMITY ANGIOGRAM;  Surgeon: Lorretta Harp, MD;  Location: [facility] CATH LAB;  Service: Cardiovascular;  Laterality: N/A;  . LOWER EXTREMITY INTERVENTION  05/08/2017  . LOWER EXTREMITY INTERVENTION  Bilateral 05/08/2017   Procedure: LOWER EXTREMITY INTERVENTION;  Surgeon: Lorretta Harp, MD;  Location: [place] CV LAB;  Service: Cardiovascular;  Laterality: Bilateral;  . PERIPHERAL VASCULAR BALLOON ANGIOPLASTY Right 05/08/2017   Procedure: PERIPHERAL VASCULAR BALLOON ANGIOPLASTY;  Surgeon: Lorretta Harp, MD;  Location: [place] CV LAB;  Service: Cardiovascular;  Laterality: Right;  sfa  . PERIPHERAL VASCULAR INTERVENTION Right 05/08/2017   Procedure: PERIPHERAL  VASCULAR INTERVENTION;  Surgeon: Lorretta Harp, MD;  Location: [place] CV LAB;  Service: Cardiovascular;  Laterality: Right;  ext iliac  . PORTACATH PLACEMENT Right 11/22/2017   Procedure: INSERTION PORT-A-CATH;  Surgeon: Leighton Ruff, MD;  Location: [facility] ORS;  Service: General;  Laterality: Right;  . TOE SURGERY Right    2n toe   . UMBILICAL HERNIA REPAIR  2000  . UMBILICAL HERNIA REPAIR N/A 08/03/2018   Procedure: UMBILICAL HERNIA REPAIR;  Surgeon: Leighton Ruff, MD;  Location: [facility] ORS;  Service: General;  Laterality: N/A;  . XI ROBOTIC ASSISTED LOWER ANTERIOR RESECTION N/A 08/03/2018   Procedure: XI ROBOTIC ASSISTED LOWER ANTERIOR RESECTION; DIAGNOSTIC FLEXIBLE SIGMOIDOSCOPY; INTRAOPERATIVE ASSESSMENT OF VASCULAR PERFUSION;  Surgeon: Leighton Ruff, MD;  Location: [facility] ORS;  Service: General;  Laterality: N/A;     A IV Location/Drains/Wounds Patient Lines/Drains/Airways Status   Active Line/Drains/Airways    Name:   Placement date:   Placement time:   Site:   Days:   Implanted Port 11/22/17 Right Chest   11/22/17    1618    Chest   313   Implanted Port Right Chest   -    -    Chest      Ileostomy Loop RUQ   08/03/18    1139    RUQ   59          Intake/Output Last 24 hours No intake or output data in the 24 hours ending 10/01/18 2348  Labs/Imaging Results for orders placed or performed during the hospital encounter of 10/01/18 (from the past 48 hour(s))  Urinalysis, Routine w reflex microscopic     Status: Abnormal   Collection Time: 10/01/18  7:36 PM  Result Value Ref Range   Color, Urine YELLOW YELLOW   APPearance HAZY (A) CLEAR   Specific Gravity, Urine 1.016 1.005 - 1.030   pH 5.0 5.0 - 8.0   Glucose, UA NEGATIVE NEGATIVE mg/dL   Hgb urine dipstick MODERATE (A) NEGATIVE   Bilirubin Urine NEGATIVE NEGATIVE   Ketones, ur NEGATIVE NEGATIVE mg/dL   Protein, ur NEGATIVE NEGATIVE mg/dL   Nitrite NEGATIVE NEGATIVE   Leukocytes,Ua MODERATE (A) NEGATIVE   RBC / HPF 6-10 0  - 5 RBC/hpf   WBC, UA 21-50 0 - 5 WBC/hpf   Bacteria, UA RARE (A) NONE SEEN   Squamous Epithelial / LPF 0-5 0 - 5   Mucus PRESENT    Hyaline Casts, UA PRESENT     Comment: Performed at [facility], [place] [address], [place], [place] [address]  Comprehensive metabolic panel     Status: Abnormal   Collection Time: 10/01/18  8:34 PM  Result Value Ref Range   Sodium 130 (L) 135 - 145 mmol/L   Potassium 2.4 (LL) 3.5 - 5.1 mmol/L    Comment: CRITICAL RESULT CALLED TO, READ BACK BY AND VERIFIED WITH: QUITA, RN @ 2110 ON 10/01/2018 C VARNER    Chloride 87 (L) 98 - 111 mmol/L   CO2 22 22 - 32 mmol/L   Glucose, Bld 316 (H) 70 -  99 mg/dL   BUN 156 (H) 8 - 23 mg/dL    Comment: RESULTS CONFIRMED BY MANUAL DILUTION   Creatinine, Ser 4.21 (H) 0.44 - 1.00 mg/dL   Calcium 10.3 8.9 - 10.3 mg/dL   Total Protein 8.2 (H) 6.5 - 8.1 g/dL   Albumin 4.4 3.5 - 5.0 g/dL   AST 22 15 - 41 U/L   ALT 15 0 - 44 U/L   Alkaline Phosphatase 91 38 - 126 U/L   Total Bilirubin 0.8 0.3 - 1.2 mg/dL   GFR calc non Af Amer 10 (L) >60 mL/min   GFR calc Af Amer 12 (L) >60 mL/min   Anion gap 21 (H) 5 - 15    Comment: Performed at [facility], [place] [address] [place], [place] [address]  CBC with Differential     Status: Abnormal   Collection Time: 10/01/18  8:34 PM  Result Value Ref Range   WBC 5.2 4.0 - 10.5 K/uL   RBC 3.99 3.87 - 5.11 MIL/uL   Hemoglobin 11.6 (L) 12.0 - 15.0 g/dL   HCT 34.3 (L) 36.0 - 46.0 %   MCV 86.0 80.0 - 100.0 fL   MCH 29.1 26.0 - 34.0 pg   MCHC 33.8 30.0 - 36.0 g/dL   RDW 14.1 11.5 - 15.5 %   Platelets 348 150 - 400 K/uL   nRBC 0.0 0.0 - 0.2 %   Neutrophils Relative % 85 %   Neutro Abs 4.4 1.7 - 7.7 K/uL   Lymphocytes Relative 7 %   Lymphs Abs 0.4 (L) 0.7 - 4.0 K/uL   Monocytes Relative 7 %   Monocytes Absolute 0.4 0.1 - 1.0 K/uL   Eosinophils Relative 1 %   Eosinophils Absolute 0.0 0.0 - 0.5 K/uL   Basophils Relative 0 %   Basophils Absolute 0.0  0.0 - 0.1 K/uL   Immature Granulocytes 0 %   Abs Immature Granulocytes 0.01 0.00 - 0.07 K/uL    Comment: Performed at [facility], [place] [address] [place], [place] [address]  Magnesium     Status: Abnormal   Collection Time: 10/01/18 11:30 PM  Result Value Ref Range   Magnesium 2.6 (H) 1.7 - 2.4 mg/dL    Comment: Performed at [facility], [place] [address], [place], [place] [address]  Phosphorus     Status: Abnormal   Collection Time: 10/01/18 11:30 PM  Result Value Ref Range   Phosphorus 7.4 (H) 2.5 - 4.6 mg/dL    Comment: Performed at [facility], [place] [address], [place],  [address]  CK     Status: None   Collection Time: 10/01/18 11:30 PM  Result Value Ref Range   Total CK 132 38 - 234 U/L    Comment: Performed at [facility], [place] [address] [place], [place] [address]   Ct Head Wo Contrast  Result Date: 10/01/2018 CLINICAL DATA:  Failure to thrive EXAM: CT HEAD WITHOUT CONTRAST TECHNIQUE: Contiguous axial images were obtained from the base of the skull through the vertex without intravenous contrast. COMPARISON:  11/05/2013 FINDINGS: Brain: Old right basal ganglia lacunar infarct. There is atrophy and chronic small vessel disease changes. No acute intracranial abnormality. Specifically, no hemorrhage, hydrocephalus, mass lesion, acute infarction, or significant intracranial injury. Vascular: No hyperdense vessel or unexpected calcification. Skull: No acute calvarial abnormality. Sinuses/Orbits: Visualized paranasal sinuses and mastoids clear. Orbital soft tissues unremarkable. Other: None IMPRESSION: Old right basal ganglia lacunar infarct. Atrophy, chronic  microvascular disease. No acute intracranial abnormality. Electronically Signed   By: Rolm Baptise M.D.   On: 10/01/2018 23:24   Dg Abd Acute 2+v W 1v Chest  Result Date: 10/01/2018 CLINICAL DATA:  Cough, abdominal distention EXAM: DG ABDOMEN ACUTE W/  1V CHEST COMPARISON:  11/22/2017 chest x-ray.  Abdominal film 08/28/2018 FINDINGS: Right Port-A-Cath in place with the tip in the SVC, unchanged. Lungs clear. Heart is normal size. No effusions. Nonobstructive bowel gas pattern. No organomegaly, free air or suspicious calcification. Vascular calcifications noted. No acute bony abnormality. IMPRESSION: No acute cardiopulmonary disease. No evidence of bowel obstruction or free air. Electronically Signed   By: Rolm Baptise M.D.   On: 10/01/2018 21:09    Pending Labs Unresulted Labs (From admission, onward)    Start     Ordered   10/02/18 0500  Prealbumin  Tomorrow morning,   R     10/01/18 2259   10/01/18 2253  Osmolality, urine  Once,   STAT     10/01/18 2252   10/01/18 2253  Sodium, urine, random  Once,   STAT     10/01/18 2252   10/01/18 2253  Creatinine, urine, random  Once,   STAT     10/01/18 2252   10/01/18 2135  SARS Coronavirus 2 [facility] order, Performed in [facility] hospital lab) Nasopharyngeal Nasopharyngeal Swab  (Symptomatic/High Risk of Exposure/Tier 1 Patients Labs with Precautions)  Once,   STAT    Question Answer Comment  Is this test for diagnosis or screening Diagnosis of ill patient   Symptomatic for COVID-19 as defined by CDC No   Hospitalized for COVID-19 No   Admitted to ICU for COVID-19 No   Previously tested for COVID-19 Yes   Resident in a congregate (group) care setting No   Employed in healthcare setting No   Pregnant No      10/01/18 2134   10/01/18 1937  Urine culture  ONCE - STAT,   STAT     10/01/18 1936   Signed and Held  Hemoglobin A1c  Once,   R    Comments: To assess prior glycemic control    Signed and Held   Signed and Held  HIV antibody (Routine Testing)  Tomorrow morning,   R     Signed and Held   Signed and Held  Magnesium  Tomorrow morning,   R    Comments: Call MD if <1.5    Signed and Held   Signed and Held  Phosphorus  Tomorrow morning,   R     Signed and Held   Signed and Held   TSH  Once,   R    Comments: Cancel if already done within 1 month and notify MD    Signed and Held   Signed and Held  Comprehensive metabolic panel  Once,   R    Comments: Cal MD for K<3.5 or >5.0    Signed and Held   Signed and Held  CBC  Once,   R    Comments: Call for hg <8.0    Signed and Held          Vitals/Pain Today's Vitals   10/01/18 2150 10/01/18 2200 10/01/18 2330 10/01/18 2332  BP:  (!) 184/77 (!) 154/95   Pulse: 69 74 70   Resp: 14 16 16    Temp:   97.7 F (36.5 C)   TempSrc:   Oral   SpO2: 100% 100% 100%   Weight:  Height:      PainSc:    0-No pain    Isolation Precautions No active isolations  Medications Medications  potassium chloride 10 mEq in 100 mL IVPB (0 mEq Intravenous Stopped 10/01/18 2328)  magic mouthwash (has no administration in time range)  alum & mag hydroxide-simeth (MAALOX/MYLANTA) 200-200-20 MG/5ML suspension 30 mL (30 mLs Oral Given 10/01/18 2032)    And  lidocaine (XYLOCAINE) 2 % viscous mouth solution 15 mL (15 mLs Oral Given 10/01/18 2032)  sodium chloride 0.9 % bolus 500 mL (0 mLs Intravenous Stopped 10/01/18 2155)  sodium chloride 0.9 % bolus 500 mL (0 mLs Intravenous Stopped 10/01/18 2227)  cefTRIAXone (ROCEPHIN) 1 g in sodium chloride 0.9 % 100 mL IVPB (0 g Intravenous Stopped 10/01/18 2227)    Mobility walks with device Low fall risk   Focused Assessments Pulmonary Assessment Handoff:  Lung sounds:   O2 Device: Room Air        R Recommendations: See Admitting Provider Note  Report given to:   Additional Notes: N/A

## 2018-10-01 NOTE — ED Notes (Signed)
Dr. Lita Mains aware potassium 2.4- orders to follow.

## 2018-10-02 ENCOUNTER — Inpatient Hospital Stay (HOSPITAL_COMMUNITY): Payer: Medicare Other

## 2018-10-02 ENCOUNTER — Other Ambulatory Visit: Payer: Self-pay

## 2018-10-02 DIAGNOSIS — E860 Dehydration: Secondary | ICD-10-CM

## 2018-10-02 DIAGNOSIS — C20 Malignant neoplasm of rectum: Secondary | ICD-10-CM

## 2018-10-02 DIAGNOSIS — N179 Acute kidney failure, unspecified: Principal | ICD-10-CM

## 2018-10-02 LAB — CBC
HCT: 31.2 % — ABNORMAL LOW (ref 36.0–46.0)
Hemoglobin: 10.4 g/dL — ABNORMAL LOW (ref 12.0–15.0)
MCH: 28.8 pg (ref 26.0–34.0)
MCHC: 33.3 g/dL (ref 30.0–36.0)
MCV: 86.4 fL (ref 80.0–100.0)
Platelets: 344 10*3/uL (ref 150–400)
RBC: 3.61 MIL/uL — ABNORMAL LOW (ref 3.87–5.11)
RDW: 14.3 % (ref 11.5–15.5)
WBC: 5 10*3/uL (ref 4.0–10.5)
nRBC: 0 % (ref 0.0–0.2)

## 2018-10-02 LAB — MAGNESIUM: Magnesium: 2.6 mg/dL — ABNORMAL HIGH (ref 1.7–2.4)

## 2018-10-02 LAB — COMPREHENSIVE METABOLIC PANEL
ALT: 14 U/L (ref 0–44)
AST: 23 U/L (ref 15–41)
Albumin: 3.8 g/dL (ref 3.5–5.0)
Alkaline Phosphatase: 77 U/L (ref 38–126)
Anion gap: 14 (ref 5–15)
BUN: 140 mg/dL — ABNORMAL HIGH (ref 8–23)
CO2: 23 mmol/L (ref 22–32)
Calcium: 9.7 mg/dL (ref 8.9–10.3)
Chloride: 95 mmol/L — ABNORMAL LOW (ref 98–111)
Creatinine, Ser: 3.16 mg/dL — ABNORMAL HIGH (ref 0.44–1.00)
GFR calc Af Amer: 16 mL/min — ABNORMAL LOW (ref >60)
GFR calc non Af Amer: 14 mL/min — ABNORMAL LOW (ref >60)
Glucose, Bld: 213 mg/dL — ABNORMAL HIGH (ref 70–99)
Potassium: 2.3 mmol/L — CL (ref 3.5–5.1)
Sodium: 132 mmol/L — ABNORMAL LOW (ref 135–145)
Total Bilirubin: 0.4 mg/dL (ref 0.3–1.2)
Total Protein: 7.1 g/dL (ref 6.5–8.1)

## 2018-10-02 LAB — POTASSIUM: Potassium: 2.7 mmol/L — CL (ref 3.5–5.1)

## 2018-10-02 LAB — GLUCOSE, CAPILLARY
Glucose-Capillary: 128 mg/dL — ABNORMAL HIGH (ref 70–99)
Glucose-Capillary: 132 mg/dL — ABNORMAL HIGH (ref 70–99)
Glucose-Capillary: 165 mg/dL — ABNORMAL HIGH (ref 70–99)
Glucose-Capillary: 220 mg/dL — ABNORMAL HIGH (ref 70–99)
Glucose-Capillary: 234 mg/dL — ABNORMAL HIGH (ref 70–99)
Glucose-Capillary: 254 mg/dL — ABNORMAL HIGH (ref 70–99)
Glucose-Capillary: 257 mg/dL — ABNORMAL HIGH (ref 70–99)

## 2018-10-02 LAB — PHOSPHORUS: Phosphorus: 4.9 mg/dL — ABNORMAL HIGH (ref 2.5–4.6)

## 2018-10-02 LAB — SODIUM, URINE, RANDOM: Sodium, Ur: <10 mmol/L

## 2018-10-02 LAB — URINE CULTURE: Culture: NO GROWTH

## 2018-10-02 LAB — CREATININE, URINE, RANDOM: Creatinine, Urine: 368.91 mg/dL

## 2018-10-02 LAB — PREALBUMIN: Prealbumin: 36.8 mg/dL (ref 18–38)

## 2018-10-02 LAB — TSH: TSH: 0.968 u[IU]/mL (ref 0.350–4.500)

## 2018-10-02 LAB — HIV ANTIBODY (ROUTINE TESTING W REFLEX): HIV Screen 4th Generation wRfx: NONREACTIVE

## 2018-10-02 LAB — OSMOLALITY, URINE: Osmolality, Ur: 387 mOsm/kg (ref 300–900)

## 2018-10-02 MED ORDER — ACETAMINOPHEN 650 MG RE SUPP: 650.0000 mg | Freq: Four times a day (QID) | RECTAL | Status: DC | PRN

## 2018-10-02 MED ORDER — GABAPENTIN 250 MG/5ML PO SOLN
300.0000 mg | Freq: Two times a day (BID) | ORAL | Status: DC
  Administered 2018-10-02 – 2018-10-06 (×8): 300 mg via ORAL
  Filled 2018-10-02 (×10): qty 6

## 2018-10-02 MED ORDER — LORATADINE 10 MG PO TABS
10.0000 mg | ORAL_TABLET | Freq: Every day | ORAL | Status: DC
  Administered 2018-10-02 – 2018-10-06 (×3): 10 mg via ORAL
  Filled 2018-10-02 (×4): qty 1

## 2018-10-02 MED ORDER — SODIUM CHLORIDE 0.9 % IV SOLN
INTRAVENOUS | Status: AC
  Administered 2018-10-02 (×2): via INTRAVENOUS

## 2018-10-02 MED ORDER — INSULIN ASPART 100 UNIT/ML ~~LOC~~ SOLN
0.0000 [IU] | SUBCUTANEOUS | Status: DC
  Administered 2018-10-02: 5 [IU] via SUBCUTANEOUS
  Administered 2018-10-02: 1 [IU] via SUBCUTANEOUS
  Administered 2018-10-02: 17:00:00 3 [IU] via SUBCUTANEOUS
  Administered 2018-10-02: 5 [IU] via SUBCUTANEOUS
  Administered 2018-10-02 (×2): 1 [IU] via SUBCUTANEOUS
  Administered 2018-10-02 – 2018-10-03 (×4): 2 [IU] via SUBCUTANEOUS
  Administered 2018-10-03: 21:00:00 5 [IU] via SUBCUTANEOUS
  Administered 2018-10-04: 3 [IU] via SUBCUTANEOUS
  Administered 2018-10-04: 2 [IU] via SUBCUTANEOUS
  Administered 2018-10-04: 3 [IU] via SUBCUTANEOUS
  Administered 2018-10-04 – 2018-10-05 (×2): 2 [IU] via SUBCUTANEOUS
  Administered 2018-10-05: 5 [IU] via SUBCUTANEOUS
  Administered 2018-10-05 – 2018-10-06 (×2): 1 [IU] via SUBCUTANEOUS
  Administered 2018-10-06: 5 [IU] via SUBCUTANEOUS
  Administered 2018-10-06: 1 [IU] via SUBCUTANEOUS

## 2018-10-02 MED ORDER — SODIUM CHLORIDE 0.9% FLUSH: 10.0000 mL | INTRAVENOUS | Status: DC | PRN

## 2018-10-02 MED ORDER — AMLODIPINE BESYLATE 5 MG PO TABS
5.0000 mg | ORAL_TABLET | Freq: Every day | ORAL | Status: DC
  Administered 2018-10-02 – 2018-10-06 (×4): 5 mg via ORAL
  Filled 2018-10-02 (×5): qty 1

## 2018-10-02 MED ORDER — MIRTAZAPINE 15 MG PO TABS
15.0000 mg | ORAL_TABLET | Freq: Every day | ORAL | Status: DC
  Administered 2018-10-02 – 2018-10-05 (×5): 15 mg via ORAL
  Filled 2018-10-02 (×5): qty 1

## 2018-10-02 MED ORDER — ACETAMINOPHEN 160 MG/5ML PO SOLN
650.0000 mg | Freq: Four times a day (QID) | ORAL | Status: DC | PRN
  Administered 2018-10-04: 650 mg via ORAL
  Filled 2018-10-02: qty 20.3

## 2018-10-02 MED ORDER — CHLORHEXIDINE GLUCONATE CLOTH 2 % EX PADS
6.0000 | MEDICATED_PAD | Freq: Every day | CUTANEOUS | Status: DC
  Administered 2018-10-02 – 2018-10-06 (×5): 6 via TOPICAL

## 2018-10-02 MED ORDER — ONDANSETRON HCL 4 MG PO TABS: 4.0000 mg | ORAL_TABLET | Freq: Four times a day (QID) | ORAL | Status: DC | PRN

## 2018-10-02 MED ORDER — LEVOTHYROXINE SODIUM 50 MCG PO TABS
50.0000 ug | ORAL_TABLET | Freq: Every day | ORAL | Status: DC
  Administered 2018-10-02 – 2018-10-06 (×5): 50 ug via ORAL
  Filled 2018-10-02 (×5): qty 1

## 2018-10-02 MED ORDER — ASPIRIN EC 81 MG PO TBEC
81.0000 mg | DELAYED_RELEASE_TABLET | Freq: Every day | ORAL | Status: DC
  Administered 2018-10-02 – 2018-10-06 (×3): 81 mg via ORAL
  Filled 2018-10-02 (×4): qty 1

## 2018-10-02 MED ORDER — HYDROCODONE-ACETAMINOPHEN 5-325 MG PO TABS: 1.0000 | ORAL_TABLET | ORAL | Status: DC | PRN

## 2018-10-02 MED ORDER — HYDRALAZINE HCL 20 MG/ML IJ SOLN
10.0000 mg | Freq: Once | INTRAMUSCULAR | Status: AC
  Administered 2018-10-02: 10 mg via INTRAVENOUS
  Filled 2018-10-02: qty 1

## 2018-10-02 MED ORDER — ATORVASTATIN CALCIUM 10 MG PO TABS
10.0000 mg | ORAL_TABLET | Freq: Every day | ORAL | Status: DC
  Administered 2018-10-02 – 2018-10-06 (×4): 10 mg via ORAL
  Filled 2018-10-02 (×5): qty 1

## 2018-10-02 MED ORDER — THIAMINE HCL 100 MG/ML IJ SOLN
100.0000 mg | Freq: Every day | INTRAMUSCULAR | Status: DC
  Administered 2018-10-02 – 2018-10-05 (×4): 100 mg via INTRAVENOUS
  Filled 2018-10-02 (×4): qty 2

## 2018-10-02 MED ORDER — BOOST / RESOURCE BREEZE PO LIQD CUSTOM
1.0000 | Freq: Three times a day (TID) | ORAL | Status: DC
  Administered 2018-10-02 – 2018-10-05 (×7): 1 via ORAL

## 2018-10-02 MED ORDER — ADULT MULTIVITAMIN W/MINERALS CH
1.0000 | ORAL_TABLET | Freq: Every day | ORAL | Status: DC
  Administered 2018-10-02 – 2018-10-06 (×3): 1 via ORAL
  Filled 2018-10-02 (×5): qty 1

## 2018-10-02 MED ORDER — POLYSACCHARIDE IRON COMPLEX 150 MG PO CAPS
150.0000 mg | ORAL_CAPSULE | Freq: Two times a day (BID) | ORAL | Status: DC
  Administered 2018-10-02 – 2018-10-06 (×7): 150 mg via ORAL
  Filled 2018-10-02 (×10): qty 1

## 2018-10-02 MED ORDER — GABAPENTIN 300 MG PO CAPS
300.0000 mg | ORAL_CAPSULE | Freq: Two times a day (BID) | ORAL | Status: DC
  Administered 2018-10-02 (×3): 300 mg via ORAL
  Filled 2018-10-02 (×3): qty 1

## 2018-10-02 MED ORDER — POTASSIUM CHLORIDE 20 MEQ/15ML (10%) PO SOLN
40.0000 meq | ORAL | Status: AC
  Administered 2018-10-02 (×2): 40 meq via ORAL
  Filled 2018-10-02 (×2): qty 30

## 2018-10-02 MED ORDER — POTASSIUM CHLORIDE 10 MEQ/100ML IV SOLN
10.0000 meq | INTRAVENOUS | Status: AC
  Administered 2018-10-02 (×4): 10 meq via INTRAVENOUS
  Filled 2018-10-02 (×4): qty 100

## 2018-10-02 MED ORDER — SODIUM CHLORIDE 0.9 % IV SOLN
1.0000 g | INTRAVENOUS | Status: DC
  Administered 2018-10-02 – 2018-10-03 (×2): 1 g via INTRAVENOUS
  Filled 2018-10-02: qty 1
  Filled 2018-10-02: qty 10
  Filled 2018-10-02: qty 1

## 2018-10-02 MED ORDER — HYDROCODONE-ACETAMINOPHEN 7.5-325 MG/15ML PO SOLN
15.0000 mL | ORAL | Status: DC | PRN
  Administered 2018-10-04: 15 mL via ORAL
  Filled 2018-10-02: qty 15

## 2018-10-02 MED ORDER — ONDANSETRON HCL 4 MG/2ML IJ SOLN: 4.0000 mg | Freq: Four times a day (QID) | INTRAMUSCULAR | Status: DC | PRN

## 2018-10-02 MED ORDER — ACETAMINOPHEN 325 MG PO TABS: 650.0000 mg | ORAL_TABLET | Freq: Four times a day (QID) | ORAL | Status: DC | PRN

## 2018-10-02 MED ORDER — POTASSIUM CHLORIDE CRYS ER 20 MEQ PO TBCR
40.0000 meq | EXTENDED_RELEASE_TABLET | Freq: Once | ORAL | Status: AC
  Administered 2018-10-02: 40 meq via ORAL
  Filled 2018-10-02: qty 2

## 2018-10-02 NOTE — Progress Notes (Signed)
NT reported that patient could not awake enough to sitting up and stand up to do orthostatic BP a MD ordered. Will try again later.

## 2018-10-02 NOTE — Evaluation (Signed)
Occupational Therapy Evaluation Patient Details Name: Nicole Sellers MRN: [id number] DOB: 20-Mar-1950 Today's Date: 10/02/2018    History of Present Illness Nicole Sellers is a 68 y.o. female with medical history significant of Colon CA, anemia, clotting disorder, lower limb ischemia stenting, GERD, HLD, HTN, hypothyroidism, Dm2, tobacco abuse. Pt presented with decreased appetite for the past 3 weeks since she has undergone her surgery for colon cancer has trouble swallowing her food. She also reports generalized fatigue.   Clinical Impression   This 68 yo female admitted with above presents to acute OT with generalized weakness and decreased balance thus affecting her safety and independence with basic ADLs and IADLs. Since she lives alone she needs to be Mod I to independent and currently is min A to setup/S level. She will benefit from acute OT with follow up at SNF.     Follow Up Recommendations  SNF;Supervision/Assistance - 24 hour    Equipment Recommendations  Other (comment)(TBD at next venue)       Precautions / Restrictions Precautions Precautions: Fall Restrictions Weight Bearing Restrictions: No      Mobility Bed Mobility Overal bed mobility: Needs Assistance Bed Mobility: Supine to Sit     Supine to sit: Min assist;HOB elevated     General bed mobility comments: pt sitting on EOB upon arrival with RN in room, left pt sitting EOB at end of session eating her lunch with SLP in room  Transfers Overall transfer level: Needs assistance Equipment used: Rolling walker (2 wheeled) Transfers: Sit to/from Stand Sit to Stand: Min assist         General transfer comment: assistance required to initiate power up from EOB  and cues for hand placement on RW    Balance Overall balance assessment: Needs assistance Sitting-balance support: Feet supported;Single extremity supported Sitting balance-Leahy Scale: Fair     Standing balance support: During functional  activity;No upper extremity supported Standing balance-Leahy Scale: Fair Standing balance comment: standing at sink to wash and dry hands                           ADL either performed or assessed with clinical judgement   ADL Overall ADL's : Needs assistance/impaired Eating/Feeding: Independent   Grooming: Wash/dry hands;Min guard;Standing   Upper Body Bathing: Set up;Sitting   Lower Body Bathing: Minimal assistance;Sit to/from stand   Upper Body Dressing : Set up;Sitting   Lower Body Dressing: Minimal assistance;Sit to/from stand   Toilet Transfer: Minimal assistance;Ambulation;RW   Toileting- Clothing Manipulation and Hygiene: Minimal assistance;Sit to/from stand               Vision Patient Visual Report: No change from baseline              Pertinent Vitals/Pain Pain Assessment: No/denies pain     Hand Dominance Right   Extremity/Trunk Assessment Upper Extremity Assessment Upper Extremity Assessment: Overall WFL for tasks assessed   Lower Extremity Assessment Lower Extremity Assessment: Generalized weakness       Communication Communication Communication: No difficulties   Cognition Arousal/Alertness: Awake/alert Behavior During Therapy: Flat affect Overall Cognitive Status: Within Functional Limits for tasks assessed                                 General Comments: pt lethargic at start of session and easily aroused with verbal cues, increased alertness with mobility after sitting up  EOB              Home Living Family/patient expects to be discharged to:: (senior living apartment) Living Arrangements: Alone Available Help at Discharge: Family;Available PRN/intermittently(son and granddaughter can help intermittently) Type of Home: Apartment Home Access: Level entry     Home Layout: One level     Bathroom Shower/Tub: Teacher, early years/pre: Standard Bathroom Accessibility: Yes   Home Equipment:  Marine scientist - single point   Additional Comments: pt lives on 4th floor of senior apartment residence, there is an Media planner      Prior Functioning/Environment Level of Independence: Independent with assistive device(s)        Comments: pt uses SPC (walking stick that was her brother's) for gait around her home, does not drive, reports she is going to see her MD about getting a RW as she does not have one        OT Problem List: Decreased strength;Impaired balance (sitting and/or standing)      OT Treatment/Interventions: Self-care/ADL training;DME and/or AE instruction;Patient/family education;Balance training    OT Goals(Current goals can be found in the care plan section) Acute Rehab OT Goals Patient Stated Goal: to get back to taking care of myself OT Goal Formulation: With patient Time For Goal Achievement: 10/16/18 Potential to Achieve Goals: Good  OT Frequency: Min 2X/week   Barriers to D/C: Decreased caregiver support             AM-PAC OT "6 Clicks" Daily Activity     Outcome Measure Help from another person eating meals?: None Help from another person taking care of personal grooming?: A Little Help from another person toileting, which includes using toliet, bedpan, or urinal?: A Little Help from another person bathing (including washing, rinsing, drying)?: A Little Help from another person to put on and taking off regular upper body clothing?: A Little Help from another person to put on and taking off regular lower body clothing?: A Little 6 Click Score: 19   End of Session Equipment Utilized During Treatment: Rolling walker;Gait belt  Activity Tolerance: Patient tolerated treatment well Patient left: (sitting EOB eating lunch)  OT Visit Diagnosis: Unsteadiness on feet (R26.81);Other abnormalities of gait and mobility (R26.89);Muscle weakness (generalized) (M62.81)                Time: 3016-0109 OT Time Calculation (min): 19 min Charges:  OT General  Charges $OT Visit: 1 Visit OT Evaluation $OT Eval Moderate Complexity: 1 Mod  [address], OTR/L Acute [employer] Pager [phone] Office [phone]     Almon Register 10/02/2018, 12:51 PM

## 2018-10-02 NOTE — Progress Notes (Signed)
BP= 149/57. Patient has history of HTN. Medication was given as MD ordered. Patient is alert and oriented x2. Will monitor patient closely.

## 2018-10-02 NOTE — Progress Notes (Signed)
Initial Nutrition Assessment  INTERVENTION:   -Boost Breeze po TID, each supplement provides 250 kcal and 9 grams of protein  -Once diet advanced, recommend Ensure Enlive po BID, each supplement provides 350 kcal and 20 grams of protein -Magic cup TID with meals, each supplement provides 290 kcal and 9 grams of protein  NUTRITION DIAGNOSIS:   Inadequate oral intake related to poor appetite as evidenced by per patient/family report.  GOAL:   Patient will meet greater than or equal to 90% of their needs  MONITOR:   PO intake, Supplement acceptance, Labs, Weight trends, I & O's  REASON FOR ASSESSMENT:   Consult Assessment of nutrition requirement/status  ASSESSMENT:   68 y.o. female with medical history significant of Colon CA, anemia, clotting disorder, lower limb ischemia stenting, GERD, HLD, HTN, hypothyroidism, Dm2, tobacco abuse. Admitted with   decreased appetite for the past 3 weeks since she has undergone her surgery for colon cancer has trouble swallowing her food  **RD working remotely**  Patient reports poor appetite and issues with swallowing following colorectal surgery in August 2020. Patient had poor PO following surgery and required TPN in previous admission.  Per SLP note today, pt states she cannot tolerate more than clear liquids d/t pain. SLP recommends work-up of esophagus.  Boost Breeze supplements have been ordered, will continue as pt is drinking them.   Per weight records, pt's weights have increased since 8/17. Will continue to monitor weight trends.  Medications: Niferex, Remeron tablet, Multivitamin with minerals daily, IV  Thiamine, IV KCl Labs reviewed: CBGs: 128-132 Low Na, K Elevated Phos, Mg GFR: 16  NUTRITION - FOCUSED PHYSICAL EXAM:  Unable to perform-working remotely.  Diet Order:   Diet Order            Diet clear liquid Room service appropriate? Yes; Fluid consistency: Thin  Diet effective now              EDUCATION NEEDS:    Not appropriate for education at this time  Skin:  Skin Assessment: Reviewed RN Assessment  Last BM:  9/22  Height:   Ht Readings from Last 1 Encounters:  10/01/18 5\' 6"  (1.676 m)    Weight:   Wt Readings from Last 1 Encounters:  10/01/18 70.8 kg    Ideal Body Weight:  59.1 kg  BMI:  Body mass index is 25.18 kg/m.  Estimated Nutritional Needs:   Kcal:  1700-1900  Protein:  80-90g  Fluid:  1.7L/day  Clayton Bibles, MS, RD, LDN Inpatient Clinical Dietitian Pager: [phone] After Hours Pager: [phone]

## 2018-10-02 NOTE — Progress Notes (Signed)
CRITICAL VALUE ALERT  Critical Value:Potassium 2.7  Date & Time Notied:10/02/2018 @1626   Provider Notified:R. Nettey paged thru Amion  Orders Received/Actions taken: Awaiting response

## 2018-10-02 NOTE — Evaluation (Signed)
Physical Therapy Evaluation Patient Details Name: Nicole Sellers MRN: [id number] DOB: March 17, 1950 Today's Date: 10/02/2018   History of Present Illness  Nicole Sellers is a 68 y.o. female with medical history significant of Colon CA, anemia, clotting disorder, lower limb ischemia stenting, GERD, HLD, HTN, hypothyroidism, Dm2, tobacco abuse. Pt presented with decreased appetite for the past 3 weeks since she has undergone her surgery for colon cancer has trouble swallowing her food. She also reports generalized fatigue.    Clinical Impression  Nicole Sellers is a 68 y.o. female admitted with above HPI. At start of session pt lethargic but easily roused with verbal cues and improved alertness with mobility. She was oriented to self, place, and situation. Patient reports modified independence at baseline to mobilize with [facility] in her home. Patient currently requires min assist for bed mobility, transfers, and gait, with RW. Pt's family can provide intermittent supervision at this time and will require 24 supervision for mobility. She will benefit from additional skilled PT interventions at below venue to address impariments and progress towards PLOF. Acute PT will follow and progress as able.    Follow Up Recommendations SNF    Equipment Recommendations  None recommended by PT(pt will need RW if she does not go to SNF, otherwise defer to facility)    Recommendations for Other Services       Precautions / Restrictions Precautions Precautions: Fall Restrictions Weight Bearing Restrictions: No      Mobility  Bed Mobility Overal bed mobility: Needs Assistance Bed Mobility: Supine to Sit     Supine to sit: Min assist;HOB elevated     General bed mobility comments: pt sitting on EOB upon arrival with RN in room, left pt sitting EOB at end of session eating her lunch with SLP in room  Transfers Overall transfer level: Needs assistance Equipment used: Rolling walker (2  wheeled) Transfers: Sit to/from Stand Sit to Stand: Min assist         General transfer comment: assistance required to initiate power up from EOB  and cues for hand placement on RW  Ambulation/Gait Ambulation/Gait assistance: Min assist Gait Distance (Feet): 20 Feet(2 bouts 10' each to amb in hospital room) Assistive device: Rolling walker (2 wheeled) Gait Pattern/deviations: Step-through pattern;Decreased stride length;Decreased step length - left;Decreased step length - right Gait velocity: decreased   General Gait Details: pt required cues for safe hand placement on RW and for safe proximety to [employer]            Wheelchair Mobility    Modified Rankin (Stroke Patients Only)       Balance Overall balance assessment: Needs assistance Sitting-balance support: Feet supported;Single extremity supported Sitting balance-Leahy Scale: Fair     Standing balance support: During functional activity;No upper extremity supported Standing balance-Leahy Scale: Fair Standing balance comment: standing at sink to wash and dry hands              Pertinent Vitals/Pain Pain Assessment: No/denies pain    Home Living Family/patient expects to be discharged to:: (senior living apartment) Living Arrangements: Alone Available Help at Discharge: Family;Available PRN/intermittently(son and granddaughter can help intermittently) Type of Home: Apartment Home Access: Level entry     Home Layout: One level Home Equipment: Shower seat;Cane - single point Additional Comments: pt lives on 4th floor of senior apartment residence, there is an Media planner    Prior Function Level of Independence: Independent with assistive device(s)         Comments:  pt uses SPC (walking stick that was her brother's) for gait around her home, does not drive, reports she is going to see her MD about getting a RW as she does not have one     Hand Dominance   Dominant Hand: Right     Extremity/Trunk Assessment   Upper Extremity Assessment Upper Extremity Assessment: Overall WFL for tasks assessed    Lower Extremity Assessment Lower Extremity Assessment: Generalized weakness       Communication   Communication: No difficulties  Cognition Arousal/Alertness: Awake/alert Behavior During Therapy: Flat affect Overall Cognitive Status: Within Functional Limits for tasks assessed            General Comments: pt lethargic at start of session and easily aroused with verbal cues, increased alertness with mobility after sitting up EOB             Assessment/Plan    PT Assessment Patient needs continued PT services  PT Problem List Decreased strength;Decreased balance;Decreased mobility;Decreased activity tolerance;Decreased safety awareness;Decreased knowledge of use of DME       PT Treatment Interventions Functional mobility training;DME instruction;Balance training;Patient/family education;Gait training;Therapeutic activities;Therapeutic exercise    PT Goals (Current goals can be found in the Care Plan section)  Acute Rehab PT Goals Patient Stated Goal: to return to independent function PT Goal Formulation: With patient Time For Goal Achievement: 10/16/18 Potential to Achieve Goals: Good    Frequency Min 3X/week   Barriers to discharge Decreased caregiver support         AM-PAC PT "6 Clicks" Mobility  Outcome Measure Help needed turning from your back to your side while in a flat bed without using bedrails?: A Little Help needed moving from lying on your back to sitting on the side of a flat bed without using bedrails?: A Little Help needed moving to and from a bed to a chair (including a wheelchair)?: A Little Help needed standing up from a chair using your arms (e.g., wheelchair or bedside chair)?: A Little Help needed to walk in hospital room?: A Little Help needed climbing 3-5 steps with a railing? : A Little 6 Click Score: 18    End of  Session Equipment Utilized During Treatment: Gait belt Activity Tolerance: Patient tolerated treatment well Patient left: in bed;with bed alarm set;with nursing/sitter in room Nurse Communication: Mobility status PT Visit Diagnosis: Unsteadiness on feet (R26.81);Other abnormalities of gait and mobility (R26.89);Muscle weakness (generalized) (M62.81)    Time: 5183-3582 PT Time Calculation (min) (ACUTE ONLY): 33 min   Charges:   PT Evaluation $PT Eval Moderate Complexity: 1 Mod PT Treatments $Gait Training: 8-22 mins        Kipp Brood, PT, DPT, [facility] Physical Therapist with [facility]  10/02/2018 12:55 PM

## 2018-10-02 NOTE — Consult Note (Signed)
[place] nurse consulted, patient known to [facility] nursing team. Bedside nurse reports she is still in a 1pc.  [place] nurse notified bedside nurse that they have 1pc flat and 2" barrier rings on the department in clean supplies if needed prior to my visit in the am.   [place] Nurse will follow along with you for continued support with ostomy teaching and care [place] MSN, [place], [place], [place], [place]

## 2018-10-02 NOTE — Progress Notes (Signed)
CRITICAL VALUE ALERT  Critical Value: potassium 2.3  Date & Time Notied: 10/02/18 @ 0519  Provider Notified: Ms. Hilbert Bible  Orders Received/Actions taken: waiting for new order

## 2018-10-02 NOTE — Progress Notes (Signed)
Pt BP is 181/77, HR 73, afebrile, paged provider on call for review; waiting for further order; pt AOx3, resting no signs of distress noted.

## 2018-10-02 NOTE — Progress Notes (Signed)
OT Cancellation Note  Patient Details Name: Nicole Sellers MRN: [id number] DOB: 26-Nov-1950   Cancelled Treatment:    Reason Eval/Treat Not Completed: Other (comment). Pt sleeping soundly, will arouse but briefly when name called or questioned asked. Shook head no when asked about getting up and yes to needing to rest more. Will check back as schedule allows.  [place], OTR/L Acute Rehab Services Pager [phone] Office [phone]     Almon Register 10/02/2018, 9:24 AM

## 2018-10-02 NOTE — Progress Notes (Signed)
PROGRESS NOTE    Nicole Sellers  ION:[id number] DOB: 1950/12/02 DOA: 10/01/2018 PCP: Reynold Bowen, MD   Brief Narrative: Nicole Sellers is a 68 y.o. female with medical history significant of Colon CA, anemia, clotting disorder, lower limb ischemia stenting, GERD, HLD, HTN, hypothyroidism, Dm2, tobacco abuse. Patient presented secondary to decreased oral intake/dysphagia and weakness   Assessment & Plan:   Active Problems:   Essential hypertension   Non-insulin treated type 2 diabetes mellitus (HCC)   PAD (peripheral artery disease) Rt ABI 0.7, Lt ABI 0.46   Rectal cancer (HCC)   Dehydration   AKI (acute kidney injury) (HCC)   Hypokalemia   Pyuria   Odynophagia   Hyponatremia   Right sided weakness   Dehydration Secondary to dysphagia issues.   AKI Secondary to poor oral intake and resultant dehydration as mentioned above. Baseline creatinine of about 1. Creatinine on admission of 4.21 with an associated BUN of 156. Trended down since admission. -Daily RFP -Continue IV fluids -Strict in/out  Odynophagia Speech therapy consulted. Discussed results with SLP who recommends barium swallow -Follow-up barium swallow study  Hypokalemia Significantly low -Replete with supplementation  Hyponatremia Mild. In setting of poor oral intake -IV fluids  Pyuria Ceftriaxone started empirically. Urine cultures obtained -Follow urine culture  Essential hypertension Slightly uncontrolled. On amlodipine, Maxide -Continue amlodipine  Hyperphosphatemia Hypermagnesemia In setting of AKI. Improved slightly -RFP as mentioned above  Hypothyroidism -Continue Synthroid  PAD Patient previously on aspirin and plavix. Per chart review, did not tolerate plavix. -Continue aspirin  Rectal cancer Follows with [facility]. Added to care team.  Diabetes mellitus, type 2 Patient is on metformin and glimepiride as an outpatient. Last hemoglobin A1C of 6% -Continue SSI -Patient may  benefit from medication adjustment on discharge (discontinuing glimepiride) secondary to age  Right sided weakness History of CVA CT head unremarkable -Aspirin -Lipitor   DVT prophylaxis: SCDs Code Status:   Code Status: Full Code Family Communication: None at bedside Disposition Plan: Therapy recommending SNF discharge   Consultants:   None  Procedures:   None  Antimicrobials:  Ceftriaxone    Subjective: Has some dysphagia. No other issues overnight  Objective: Vitals:   10/02/18 0000 10/02/18 0544 10/02/18 0752 10/02/18 1429  BP: 135/90 (!) 181/77 (!) 149/57 133/62  Pulse:  73 66 64  Resp:  18 14 12   Temp: 97.8 F (36.6 C) 98.9 F (37.2 C) 98.8 F (37.1 C) 98 F (36.7 C)  TempSrc: Oral Oral Oral Oral  SpO2:   100% 100%  Weight:      Height:        Intake/Output Summary (Last 24 hours) at 10/02/2018 1436 Last data filed at 10/02/2018 1200 Gross per 24 hour  Intake 1786.85 ml  Output -  Net 1786.85 ml   Filed Weights   10/01/18 1324  Weight: 70.8 kg    Examination:  General exam: Appears calm and comfortable Respiratory system: Clear to auscultation. Respiratory effort normal. Cardiovascular system: S1 & S2 heard, RRR. No murmurs, rubs, gallops or clicks. Gastrointestinal system: Abdomen is nondistended, soft and nontender. No organomegaly or masses felt. Normal bowel sounds heard. Central nervous system: Alert and oriented. No focal neurological deficits. Extremities: No edema. No calf tenderness Skin: No cyanosis. No rashes Psychiatry: Judgement and insight appear normal. Mood & affect appropriate.     Data Reviewed: I have personally reviewed following labs and imaging studies  CBC: Recent Labs  Lab 10/01/18 2034 10/02/18 0409  WBC 5.2 5.0  NEUTROABS 4.4  --   HGB 11.6* 10.4*  HCT 34.3* 31.2*  MCV 86.0 86.4  PLT 348 384   Basic Metabolic Panel: Recent Labs  Lab 10/01/18 2034 10/01/18 2330 10/02/18 0409  NA 130*  --  132*   K 2.4*  --  2.3*  CL 87*  --  95*  CO2 22  --  23  GLUCOSE 316*  --  213*  BUN 156*  --  140*  CREATININE 4.21*  --  3.16*  CALCIUM 10.3  --  9.7  MG  --  2.6* 2.6*  PHOS  --  7.4* 4.9*   GFR: Estimated Creatinine Clearance: 15.5 mL/min (A) (by C-G formula based on SCr of 3.16 mg/dL (H)). Liver Function Tests: Recent Labs  Lab 10/01/18 2034 10/02/18 0409  AST 22 23  ALT 15 14  ALKPHOS 91 77  BILITOT 0.8 0.4  PROT 8.2* 7.1  ALBUMIN 4.4 3.8   No results for input(s): LIPASE, AMYLASE in the last 168 hours. No results for input(s): AMMONIA in the last 168 hours. Coagulation Profile: No results for input(s): INR, PROTIME in the last 168 hours. Cardiac Enzymes: Recent Labs  Lab 10/01/18 2330  CKTOTAL 132   BNP (last 3 results) No results for input(s): PROBNP in the last 8760 hours. HbA1C: No results for input(s): HGBA1C in the last 72 hours. CBG: Recent Labs  Lab 10/02/18 0023 10/02/18 0546 10/02/18 0746 10/02/18 1208  GLUCAP 257* 165* 132* 128*   Lipid Profile: No results for input(s): CHOL, HDL, LDLCALC, TRIG, CHOLHDL, LDLDIRECT in the last 72 hours. Thyroid Function Tests: Recent Labs    10/02/18 0409  TSH 0.968   Anemia Panel: No results for input(s): VITAMINB12, FOLATE, FERRITIN, TIBC, IRON, RETICCTPCT in the last 72 hours. Sepsis Labs: No results for input(s): PROCALCITON, LATICACIDVEN in the last 168 hours.  No results found for this or any previous visit (from the past 240 hour(s)).       Radiology Studies: Ct Head Wo Contrast  Result Date: 10/01/2018 CLINICAL DATA:  Failure to thrive EXAM: CT HEAD WITHOUT CONTRAST TECHNIQUE: Contiguous axial images were obtained from the base of the skull through the vertex without intravenous contrast. COMPARISON:  11/05/2013 FINDINGS: Brain: Old right basal ganglia lacunar infarct. There is atrophy and chronic small vessel disease changes. No acute intracranial abnormality. Specifically, no hemorrhage,  hydrocephalus, mass lesion, acute infarction, or significant intracranial injury. Vascular: No hyperdense vessel or unexpected calcification. Skull: No acute calvarial abnormality. Sinuses/Orbits: Visualized paranasal sinuses and mastoids clear. Orbital soft tissues unremarkable. Other: None IMPRESSION: Old right basal ganglia lacunar infarct. Atrophy, chronic microvascular disease. No acute intracranial abnormality. Electronically Signed   By: Rolm Baptise M.D.   On: 10/01/2018 23:24   US Renal  Result Date: 10/02/2018 CLINICAL DATA:  Acute renal injury. EXAM: RENAL / URINARY TRACT ULTRASOUND COMPLETE COMPARISON:  Abdomen series 10/01/2018.  CT 01/17/2018. FINDINGS: Right Kidney: Renal measurements: 8.2 x 4.3 x 4.5 cm = volume: 83.6 mL . Echogenicity within normal limits. No mass or hydronephrosis visualized. Left Kidney: Renal measurements: 9.8 x 3.8 x 4.1 cm = volume: 79.1 mL. Echogenicity within normal limits. No mass or hydronephrosis visualized. Bladder: Appears normal for degree of bladder distention. IMPRESSION: No acute or focal abnormality identified. Electronically Signed   By: Marcello Moores  Register   On: 10/02/2018 09:45   Dg Abd Acute 2+v W 1v Chest  Result Date: 10/01/2018 CLINICAL DATA:  Cough, abdominal distention EXAM: DG ABDOMEN ACUTE W/ 1V CHEST  COMPARISON:  11/22/2017 chest x-ray.  Abdominal film 08/28/2018 FINDINGS: Right Port-A-Cath in place with the tip in the SVC, unchanged. Lungs clear. Heart is normal size. No effusions. Nonobstructive bowel gas pattern. No organomegaly, free air or suspicious calcification. Vascular calcifications noted. No acute bony abnormality. IMPRESSION: No acute cardiopulmonary disease. No evidence of bowel obstruction or free air. Electronically Signed   By: Rolm Baptise M.D.   On: 10/01/2018 21:09        Scheduled Meds: . amLODipine  5 mg Oral Daily  . aspirin EC  81 mg Oral Daily  . atorvastatin  10 mg Oral Daily  . Chlorhexidine Gluconate Cloth  6  each Topical Daily  . feeding supplement  1 Container Oral TID BM  . gabapentin  300 mg Oral BID  . insulin aspart  0-9 Units Subcutaneous Q4H  . iron polysaccharides  150 mg Oral BID  . levothyroxine  50 mcg Oral Q0600  . loratadine  10 mg Oral Daily  . mirtazapine  15 mg Oral QHS  . multivitamin with minerals  1 tablet Oral Daily  . thiamine injection  100 mg Intravenous Daily   Continuous Infusions: . cefTRIAXone (ROCEPHIN)  IV       LOS: 1 day     Cordelia Poche, MD [facility] 10/02/2018, 2:36 PM  If 7PM-7AM, please contact night-coverage [email or web address]

## 2018-10-02 NOTE — Progress Notes (Signed)
HEMATOLOGY-ONCOLOGY PROGRESS NOTE  SUBJECTIVE: [name] is followed by medical oncology for adenocarcinoma the rectosigmoid colon.  The patient received prior chemotherapy with FOLFOX for 4 months followed by concurrent chemoradiation with Xeloda 1500 mg twice a day which she completed on 05/17/2018.  She underwent lower anterior resection and diverting loop ileostomy on 08/03/2018.  She is now admitted for failure to thrive.  She has had a decreased appetite for the past 3 weeks and difficulty swallowing her food.  When seen today, the patient opens her eyes when her name is called but does not talk to me.  She does not answer questions today.  She is afebrile and vital signs are stable.  Oncology History Overview Note  Cancer Staging Rectal cancer [facility]) Staging form: Colon and Rectum, AJCC 8th Edition - Clinical stage from 10/04/2017: Stage IIIB (cT3, cN1, cM0) - Signed by Truitt Merle, MD on 11/18/2017     Rectal cancer ([place])  10/04/2017 Procedure   Colonoscopy per Dr. Collene Mares 10/04/17 shows a large, non-obstructing horse-shoe shaped mass was found in the recto-sigmoid colon at 10 cm; the mass was partially circumferential involving one-half of the lumen circumference. The mass measured seven cm in length   10/04/2017 Initial Biopsy   Rectum, at 10-17 cm, mass biopsy -invasive adenocarcinoma, moderately differentiated  ICH stains for MLH1, MSH2, MSH6, and PMS2 are intact (normal)   10/04/2017 Cancer Staging   Staging form: Colon and Rectum, AJCC 8th Edition - Clinical stage from 10/04/2017: Stage IIIB (cT3, cN1, cM0) - Signed by Truitt Merle, MD on 11/18/2017   10/25/2017 Imaging   CT CAP IMPRESSION: 1. Irregular eccentric mass within the sigmoid colon. There are at least 3 abnormal appearing lymph node adjacent to the sigmoid colon at the level of the mass concerning for the possibility of local nodal metastatic disease. 2. Multiple pulmonary nodules as described above. These are indeterminate in  etiology. Recommend attention on follow-up.   11/06/2017 Initial Diagnosis   Adenocarcinoma of colon ([place])   11/16/2017 Imaging   Pelvic MRI 11/16/17  IMPRESSION: Rectal adenocarcinoma T stage:   T3 Rectal adenocarcinoma N stage:  N1 Distance from tumor to the anal sphincter is 6.9 cm.   11/29/2017 - 02/15/2018 Chemotherapy   totalneoadjuvant chemo FOLFOXq2 weeks x4 months, starting 11/29/17-03/07/18, followed by concurrent chemoRT. Completed 8 cycles of FOLFOX on 03/07/18   01/17/2018 Imaging   CT AP W Contrast 01/17/18 IMPRESSION: 1. Previously seen soft tissue mass involving the left lateral wall the upper rectum is no longer visualized. 2. Decreased size of left perirectal lymph nodes since prior exam. 3. No other sites of metastatic disease within the abdomen or pelvis. 4. Colonic diverticulosis, without radiographic evidence of diverticulitis. 5. Stable small adjacent paraumbilical hernias containing only fat.   04/09/2018 - 05/17/2018 Chemotherapy   Concurrent ChemoRT with Xeloda '1500mg'$  BID on days of radiation M-F starting 04/09/18. Held week of 04/30/18 due to dirrhea. Restarted 05/07/18 at reduced dose of '1000mg'$  in the AM and 1000-'1500mg'$  in the PM. Completed 05/17/18.    04/09/2018 - 05/17/2018 Radiation Therapy   concurrent chemoRT with Dr. Lisbeth Renshaw   04/09/18-05/17/18      REVIEW OF SYSTEMS:   Unable to obtain secondary to patient condition.  I have reviewed the past medical history, past surgical history, social history and family history with the patient and they are unchanged from previous note.   PHYSICAL EXAMINATION: ECOG PERFORMANCE STATUS: 3 - Symptomatic, >50% confined to bed  Vitals:   10/02/18 0752 10/02/18  1429  BP: (!) 149/57 133/62  Pulse: 66 64  Resp: 14 12  Temp: 98.8 F (37.1 C) 98 F (36.7 C)  SpO2: 100% 100%   Filed Weights   10/01/18 1324  Weight: 156 lb (70.8 kg)    Intake/Output from previous day: 09/21 0701 - 09/22 0700 In: 990.6 [P.O.:120;  I.V.:538.3; IV Piggyback:332.3] Out: -   GENERAL: Resting quietly.  Opens eyes when spoken to.  Does not answer questions. EYES: normal, Conjunctiva are pink and non-injected, sclera clear OROPHARYNX: No thrush or mucositis NECK: supple, thyroid normal size, non-tender, without nodularity LYMPH:  no palpable lymphadenopathy in the cervical, axillary or inguinal LUNGS: clear to auscultation and percussion with normal breathing effort HEART: regular rate & rhythm and no murmurs and no lower extremity edema ABDOMEN: Has a bowel sounds, soft, nontender. Musculoskeletal:no cyanosis of digits and no clubbing  NEURO: Alert, unable to assess orientation secondary to patient condition  LABORATORY DATA:  I have reviewed the data as listed CMP Latest Ref Rng & Units 10/02/2018 10/01/2018 08/31/2018  Glucose 70 - 99 mg/dL 213(H) 316(H) 103(H)  BUN 8 - 23 mg/dL 140(H) 156(H) 49(H)  Creatinine 0.44 - 1.00 mg/dL 3.16(H) 4.21(H) 0.97  Sodium 135 - 145 mmol/L 132(L) 130(L) 138  Potassium 3.5 - 5.1 mmol/L 2.3(LL) 2.4(LL) 4.1  Chloride 98 - 111 mmol/L 95(L) 87(L) 104  CO2 22 - 32 mmol/L '23 22 27  '$ Calcium 8.9 - 10.3 mg/dL 9.7 10.3 9.2  Total Protein 6.5 - 8.1 g/dL 7.1 8.2(H) -  Total Bilirubin 0.3 - 1.2 mg/dL 0.4 0.8 -  Alkaline Phos 38 - 126 U/L 77 91 -  AST 15 - 41 U/L 23 22 -  ALT 0 - 44 U/L 14 15 -    Lab Results  Component Value Date   WBC 5.0 10/02/2018   HGB 10.4 (L) 10/02/2018   HCT 31.2 (L) 10/02/2018   MCV 86.4 10/02/2018   PLT 344 10/02/2018   NEUTROABS 4.4 10/01/2018    Ct Head Wo Contrast  Result Date: 10/01/2018 CLINICAL DATA:  Failure to thrive EXAM: CT HEAD WITHOUT CONTRAST TECHNIQUE: Contiguous axial images were obtained from the base of the skull through the vertex without intravenous contrast. COMPARISON:  11/05/2013 FINDINGS: Brain: Old right basal ganglia lacunar infarct. There is atrophy and chronic small vessel disease changes. No acute intracranial abnormality.  Specifically, no hemorrhage, hydrocephalus, mass lesion, acute infarction, or significant intracranial injury. Vascular: No hyperdense vessel or unexpected calcification. Skull: No acute calvarial abnormality. Sinuses/Orbits: Visualized paranasal sinuses and mastoids clear. Orbital soft tissues unremarkable. Other: None IMPRESSION: Old right basal ganglia lacunar infarct. Atrophy, chronic microvascular disease. No acute intracranial abnormality. Electronically Signed   By: Rolm Baptise M.D.   On: 10/01/2018 23:24   US Renal  Result Date: 10/02/2018 CLINICAL DATA:  Acute renal injury. EXAM: RENAL / URINARY TRACT ULTRASOUND COMPLETE COMPARISON:  Abdomen series 10/01/2018.  CT 01/17/2018. FINDINGS: Right Kidney: Renal measurements: 8.2 x 4.3 x 4.5 cm = volume: 83.6 mL . Echogenicity within normal limits. No mass or hydronephrosis visualized. Left Kidney: Renal measurements: 9.8 x 3.8 x 4.1 cm = volume: 79.1 mL. Echogenicity within normal limits. No mass or hydronephrosis visualized. Bladder: Appears normal for degree of bladder distention. IMPRESSION: No acute or focal abnormality identified. Electronically Signed   By: Marcello Moores  Register   On: 10/02/2018 09:45   Dg Abd Acute 2+v W 1v Chest  Result Date: 10/01/2018 CLINICAL DATA:  Cough, abdominal distention EXAM: DG ABDOMEN ACUTE  W/ 1V CHEST COMPARISON:  11/22/2017 chest x-ray.  Abdominal film 08/28/2018 FINDINGS: Right Port-A-Cath in place with the tip in the SVC, unchanged. Lungs clear. Heart is normal size. No effusions. Nonobstructive bowel gas pattern. No organomegaly, free air or suspicious calcification. Vascular calcifications noted. No acute bony abnormality. IMPRESSION: No acute cardiopulmonary disease. No evidence of bowel obstruction or free air. Electronically Signed   By: Rolm Baptise M.D.   On: 10/01/2018 21:09    ASSESSMENT AND PLAN: 1.  Rectal adenocarcinoma, status post neoadjuvant chemotherapy followed by lower anterior resection with loop  ileostomy 2.  Dehydration 3.  AKI secondary to dehydration 4.  Hypokalemia 5.  Normocytic anemia  -The patient is currently on observation from a rectal cancer standpoint.  We will plan for outpatient follow-up for routine monitoring. -Patient is dehydrated related to decreased p.o. intake.  IV fluids per hospitalist.  Recommend dietitian consult. -AKI secondary to dehydration.  Slowly improving with hydration. -Replete potassium per hospitalist. -Hemoglobin is stable.  No need for transfusion at this time.    LOS: 1 day   Mikey Bussing, DNP, AGPCNP-BC, AOCNP 10/02/18

## 2018-10-02 NOTE — Evaluation (Signed)
Clinical/Bedside Swallow Evaluation Patient Details  Name: Nicole Sellers MRN: [id number] Date of Birth: 1950/08/10  Today's Date: 10/02/2018 Time: SLP Start Time (ACUTE ONLY): 62 SLP Stop Time (ACUTE ONLY): 1300 SLP Time Calculation (min) (ACUTE ONLY): 20 min  Past Medical History:  Past Medical History:  Diagnosis Date  . Age-related nuclear cataract, right eye   . Allergy   . Anemia   . Arthritis    "joints sometimes" (12/25/2012)  . Clotting disorder ([place])   . Colon cancer ([place])   . Critical lower limb ischemia   . Diverticulosis   . Gangrene of toe, Rt second toe 12/25/2012  . GERD (gastroesophageal reflux disease)   . High cholesterol   . Hypertension   . Hypothyroidism   . PAD (peripheral artery disease) ([place])   . PAD (peripheral artery disease) ([place])   . Peripheral neuropathy   . Rectal cancer ([place])   . S/P arterial stent, 12/25/13, successful diamondback orbital rotational arthrectomy, PTA using chocolate  balloon and stenting using I DEV stent of long segment calcified high-grade proximal and mid r 12/25/2012  . Tobacco abuse   . Type II diabetes mellitus ([place])    Past Surgical History:  Past Surgical History:  Procedure Laterality Date  . ABDOMINAL AORTAGRAM  12/20/2012   Procedure: ABDOMINAL AORTAGRAM;  Surgeon: Lorretta Harp, MD;  Location: [facility] CATH LAB;  Service: Cardiovascular;;  . ABDOMINAL HYSTERECTOMY  2000  . ANGIOPLASTY / STENTING FEMORAL Right 12/25/2012  . ATHERECTOMY Right 12/25/2012   Procedure: ATHERECTOMY;  Surgeon: Lorretta Harp, MD;  Location: [facility] CATH LAB;  Service: Cardiovascular;  Laterality: Right;  right SFA  . COLONOSCOPY    . DIVERTING ILEOSTOMY N/A 08/03/2018   Procedure: DIVERTING LOOP ILEOSTOMY;  Surgeon: Leighton Ruff, MD;  Location: [facility] ORS;  Service: General;  Laterality: N/A;  . EYE SURGERY Left    cataract  . HERNIA REPAIR    . LOWER EXTREMITY ANGIOGRAM N/A 12/20/2012   Procedure: LOWER EXTREMITY ANGIOGRAM;  Surgeon:  Lorretta Harp, MD;  Location: [facility] CATH LAB;  Service: Cardiovascular;  Laterality: N/A;  . LOWER EXTREMITY ANGIOGRAM N/A 12/25/2012   Procedure: LOWER EXTREMITY ANGIOGRAM;  Surgeon: Lorretta Harp, MD;  Location: [facility] CATH LAB;  Service: Cardiovascular;  Laterality: N/A;  . LOWER EXTREMITY INTERVENTION  05/08/2017  . LOWER EXTREMITY INTERVENTION Bilateral 05/08/2017   Procedure: LOWER EXTREMITY INTERVENTION;  Surgeon: Lorretta Harp, MD;  Location: [place] CV LAB;  Service: Cardiovascular;  Laterality: Bilateral;  . PERIPHERAL VASCULAR BALLOON ANGIOPLASTY Right 05/08/2017   Procedure: PERIPHERAL VASCULAR BALLOON ANGIOPLASTY;  Surgeon: Lorretta Harp, MD;  Location: [place] CV LAB;  Service: Cardiovascular;  Laterality: Right;  sfa  . PERIPHERAL VASCULAR INTERVENTION Right 05/08/2017   Procedure: PERIPHERAL VASCULAR INTERVENTION;  Surgeon: Lorretta Harp, MD;  Location: [place] CV LAB;  Service: Cardiovascular;  Laterality: Right;  ext iliac  . PORTACATH PLACEMENT Right 11/22/2017   Procedure: INSERTION PORT-A-CATH;  Surgeon: Leighton Ruff, MD;  Location: [facility] ORS;  Service: General;  Laterality: Right;  . TOE SURGERY Right    2n toe   . UMBILICAL HERNIA REPAIR  2000  . UMBILICAL HERNIA REPAIR N/A 08/03/2018   Procedure: UMBILICAL HERNIA REPAIR;  Surgeon: Leighton Ruff, MD;  Location: [facility] ORS;  Service: General;  Laterality: N/A;  . XI ROBOTIC ASSISTED LOWER ANTERIOR RESECTION N/A 08/03/2018   Procedure: XI ROBOTIC ASSISTED LOWER ANTERIOR RESECTION; DIAGNOSTIC FLEXIBLE SIGMOIDOSCOPY; INTRAOPERATIVE ASSESSMENT OF VASCULAR PERFUSION;  Surgeon: Leighton Ruff,  MD;  Location: [facility] ORS;  Service: General;  Laterality: N/A;   HPI:  68 y.o. female admitted 10/01/2018 with FTT from home. Poor po intake/odynophagia x3 weeks since surgery for colon cancer. PMH: Colon CA, anemia, clotting disorder, lower limb ischemia stenting, GERD, HLD, HTN, hypothyroidism, Dm2, tobacco abuse. Head CT = Old  right basal ganglia lacunar infarct. Atrophy, chronic microvascular disease. No acute intracranial abnormality. DG Abdomen/ CHEST = no acute cardiopulmonary disease.  Assessment / Plan / Recommendation Clinical Impression  Pt seen during lunch (clear liquids). Pt reports having dentures at home. She indicates odynophagia with anything with more substance than clear liquids, and refused to try any consistency I offered. No overt s/s aspiration observed or reported on thin liquids. Recommend consideration of esophageal work up to identify etiology of painful swallow. Esophageal work up in 2014 indicated issues at that time, and may have worsened since then. No further ST intervention is recommended at this time. Please reconsult if needs arise.   SLP Visit Diagnosis: Dysphagia, unspecified (R13.10)    Aspiration Risk  Mild aspiration risk    Diet Recommendation Thin liquid   Liquid Administration via: Spoon;Cup;Straw Medication Administration: (as tolerated) Supervision: Patient able to self feed Compensations: Slow rate;Small sips/bites;Minimize environmental distractions Postural Changes: Seated upright at 90 degrees;Remain upright for at least 30 minutes after po intake    Other  Recommendations Recommended Consults: Consider GI evaluation;Consider esophageal assessment;Consider ENT evaluation Oral Care Recommendations: Oral care QID   Follow up Recommendations None          Prognosis Prognosis for Safe Diet Advancement: Fair(pending esophageal work up)      Swallow Study   General HPI: 68 y.o. female admitted 10/01/2018 with FTT from home. Poor po intake/odynophagia x3 weeks since surgery for colon cancer. with medical history significant of Colon CA, anemia, clotting disorder, lower limb ischemia stenting, GERD, HLD, HTN, hypothyroidism, Dm2, tobacco abuse. Head CT = Old right basal ganglia lacunar infarct. Atrophy, chronic microvascular disease. No acute intracranial abnormality.  DG Abdomen/ CHEST = no acute cardiopulmonary disease    Oral/Motor/Sensory Function Overall Oral Motor/Sensory Function: Within functional limits   Ice Chips Ice chips: Not tested   Thin Liquid Thin Liquid: Within functional limits Presentation: Cup;Straw    Nectar Thick Nectar Thick Liquid: Not tested   Honey Thick Honey Thick Liquid: Not tested   Puree Puree: Not tested(pt refused any consistencies other than thin liquid)   Solid     Solid: Not tested Other Comments: pt refused     Maelynn Moroney B. Quentin Ore, [facility], [place] Speech Language Pathologist [phone]  Shonna Chock 10/02/2018,2:46 PM

## 2018-10-03 ENCOUNTER — Inpatient Hospital Stay (HOSPITAL_COMMUNITY): Payer: Medicare Other

## 2018-10-03 DIAGNOSIS — K209 Esophagitis, unspecified: Secondary | ICD-10-CM

## 2018-10-03 DIAGNOSIS — B370 Candidal stomatitis: Secondary | ICD-10-CM

## 2018-10-03 LAB — CBC
HCT: 30.2 % — ABNORMAL LOW (ref 36.0–46.0)
Hemoglobin: 9.9 g/dL — ABNORMAL LOW (ref 12.0–15.0)
MCH: 29.3 pg (ref 26.0–34.0)
MCHC: 32.8 g/dL (ref 30.0–36.0)
MCV: 89.3 fL (ref 80.0–100.0)
Platelets: 303 10*3/uL (ref 150–400)
RBC: 3.38 MIL/uL — ABNORMAL LOW (ref 3.87–5.11)
RDW: 14.8 % (ref 11.5–15.5)
WBC: 4.2 10*3/uL (ref 4.0–10.5)
nRBC: 0 % (ref 0.0–0.2)

## 2018-10-03 LAB — BASIC METABOLIC PANEL
Anion gap: 7 (ref 5–15)
BUN: 112 mg/dL — ABNORMAL HIGH (ref 8–23)
CO2: 23 mmol/L (ref 22–32)
Calcium: 9.5 mg/dL (ref 8.9–10.3)
Chloride: 107 mmol/L (ref 98–111)
Creatinine, Ser: 1.77 mg/dL — ABNORMAL HIGH (ref 0.44–1.00)
GFR calc Af Amer: 33 mL/min — ABNORMAL LOW (ref >60)
GFR calc non Af Amer: 29 mL/min — ABNORMAL LOW (ref >60)
Glucose, Bld: 128 mg/dL — ABNORMAL HIGH (ref 70–99)
Potassium: 2.9 mmol/L — ABNORMAL LOW (ref 3.5–5.1)
Sodium: 137 mmol/L (ref 135–145)

## 2018-10-03 LAB — MAGNESIUM: Magnesium: 2.3 mg/dL (ref 1.7–2.4)

## 2018-10-03 LAB — GLUCOSE, CAPILLARY
Glucose-Capillary: 76 mg/dL (ref 70–99)
Glucose-Capillary: 176 mg/dL — ABNORMAL HIGH (ref 70–99)
Glucose-Capillary: 181 mg/dL — ABNORMAL HIGH (ref 70–99)
Glucose-Capillary: 169 mg/dL — ABNORMAL HIGH (ref 70–99)
Glucose-Capillary: 258 mg/dL — ABNORMAL HIGH (ref 70–99)

## 2018-10-03 LAB — HEMOGLOBIN A1C
Hgb A1c MFr Bld: 6.7 % — ABNORMAL HIGH (ref 4.8–5.6)
Mean Plasma Glucose: 146 mg/dL

## 2018-10-03 LAB — HEPATIC FUNCTION PANEL
ALT: 16 U/L (ref 0–44)
AST: 31 U/L (ref 15–41)
Albumin: 3.3 g/dL — ABNORMAL LOW (ref 3.5–5.0)
Alkaline Phosphatase: 77 U/L (ref 38–126)
Bilirubin, Direct: 0.1 mg/dL (ref 0.0–0.2)
Indirect Bilirubin: 0.3 mg/dL (ref 0.3–0.9)
Total Bilirubin: 0.4 mg/dL (ref 0.3–1.2)
Total Protein: 6.5 g/dL (ref 6.5–8.1)

## 2018-10-03 LAB — PHOSPHORUS: Phosphorus: 2.5 mg/dL (ref 2.5–4.6)

## 2018-10-03 MED ORDER — FLUCONAZOLE 100 MG PO TABS
100.0000 mg | ORAL_TABLET | Freq: Every day | ORAL | Status: DC
  Administered 2018-10-03 – 2018-10-06 (×4): 100 mg via ORAL
  Filled 2018-10-03 (×4): qty 1

## 2018-10-03 MED ORDER — PANTOPRAZOLE SODIUM 40 MG IV SOLR
40.0000 mg | Freq: Two times a day (BID) | INTRAVENOUS | Status: DC
  Administered 2018-10-03 – 2018-10-06 (×6): 40 mg via INTRAVENOUS
  Filled 2018-10-03 (×6): qty 40

## 2018-10-03 MED ORDER — FLUCONAZOLE 100 MG PO TABS
100.0000 mg | ORAL_TABLET | Freq: Once | ORAL | Status: AC
  Administered 2018-10-03: 100 mg via ORAL
  Filled 2018-10-03: qty 1

## 2018-10-03 MED ORDER — INFLUENZA VAC A&B SA ADJ QUAD 0.5 ML IM PRSY
0.5000 mL | PREFILLED_SYRINGE | INTRAMUSCULAR | Status: AC
  Administered 2018-10-03: 0.5 mL via INTRAMUSCULAR
  Filled 2018-10-03: qty 0.5

## 2018-10-03 MED ORDER — POTASSIUM CHLORIDE 10 MEQ/100ML IV SOLN
10.0000 meq | INTRAVENOUS | Status: AC
  Administered 2018-10-03 (×4): 10 meq via INTRAVENOUS
  Filled 2018-10-03 (×4): qty 100

## 2018-10-03 MED ORDER — POTASSIUM CHLORIDE 10 MEQ/100ML IV SOLN
10.0000 meq | INTRAVENOUS | Status: AC
  Administered 2018-10-03 (×4): 10 meq via INTRAVENOUS
  Filled 2018-10-03 (×4): qty 100

## 2018-10-03 MED ORDER — SODIUM CHLORIDE 0.9 % IV SOLN: INTRAVENOUS | Status: DC

## 2018-10-03 MED ORDER — POTASSIUM CHLORIDE IN NACL 40-0.9 MEQ/L-% IV SOLN
INTRAVENOUS | Status: DC
  Administered 2018-10-03: 75 mL/h via INTRAVENOUS
  Filled 2018-10-03 (×2): qty 1000

## 2018-10-03 NOTE — Consult Note (Signed)
[place] Nurse ostomy consult note Stoma type/location: 1 1/2" budded, pink  Ostomy pouching: 1pc flat, patient independent with care  Enrolled patient in [place] program: Yes, previously  Patient using precut at home. Provided 1 pc flat in patient's room for use.   No other needs identified.   Discussed POC with patient and bedside nurse.  Re consult if needed, will not follow at this time. Thanks  Janiene Aarons [employer], RN,CWOCN, CNS, [place] [phone])

## 2018-10-03 NOTE — H&P (View-Only) (Signed)
Reason for Consult: Difficulty swallowing/abnormal barium swallow. Referring Physician: [facility] hospitalist.  Nicole Sellers is an 68 y.o. female.  HPI: Nicole Sellers is a 68 year old black female with multiple medical problems listed below.  She was diagnosed with stage IIIb rectal cancer in November 2019 for which she has completed 8 cycles of FOLFOX every 2 weeks for 4 months followed by concurrent chemoradiation.  In August of this year she underwent a low anterior resection.  She was admitted to the hospital with a sore throat and difficulty swallowing along with generalized weakness and a 10 to 15 pound weight loss.  On detailed questioning she says she has had this problem for several months but has never discussed this with any of her providers.  Patient was to have found to have acute kidney injury on admission and had a barium swallow that showed extensive esophagitis with ulcerations and narrowing of the distal esophagus ?mass.  Patient is been maintained on a clear liquid diet and GI consultation was requested.  She has 1-3 bowel movements per day without any evidence of melena hematochezia her oral intake has been significantly decreased because of the dysphasia//odynophagia and and she has lost about 10 pounds in the last couple of months.   Past Medical History:  Diagnosis Date  . Age-related nuclear cataract, right eye   . Allergy   . Anemia   . Arthritis    "joints sometimes" (12/25/2012)  . Clotting disorder ([place])   . Colon cancer ([place])   . Critical lower limb ischemia   . Diverticulosis   . Gangrene of toe, Rt second toe 12/25/2012  . GERD (gastroesophageal reflux disease)   . High cholesterol   . Hypertension   . Hypothyroidism   . PAD (peripheral artery disease) ([place])   . PAD (peripheral artery disease) ([place])   . Peripheral neuropathy   . Rectal cancer ([place])   . S/P arterial stent, 12/25/13, successful diamondback orbital rotational arthrectomy, PTA using chocolate   balloon and stenting using I DEV stent of long segment calcified high-grade proximal and mid r 12/25/2012  . Tobacco abuse   . Type II diabetes mellitus ([place])     Past Surgical History:  Procedure Laterality Date  . ABDOMINAL AORTAGRAM  12/20/2012   Procedure: ABDOMINAL AORTAGRAM;  Surgeon: Lorretta Harp, MD;  Location: [facility] CATH LAB;  Service: Cardiovascular;;  . ABDOMINAL HYSTERECTOMY  2000  . ANGIOPLASTY / STENTING FEMORAL Right 12/25/2012  . ATHERECTOMY Right 12/25/2012   Procedure: ATHERECTOMY;  Surgeon: Lorretta Harp, MD;  Location: [facility] CATH LAB;  Service: Cardiovascular;  Laterality: Right;  right SFA  . COLONOSCOPY    . DIVERTING ILEOSTOMY N/A 08/03/2018   Procedure: DIVERTING LOOP ILEOSTOMY;  Surgeon: Leighton Ruff, MD;  Location: [facility] ORS;  Service: General;  Laterality: N/A;  . EYE SURGERY Left    cataract  . HERNIA REPAIR    . LOWER EXTREMITY ANGIOGRAM N/A 12/20/2012   Procedure: LOWER EXTREMITY ANGIOGRAM;  Surgeon: Lorretta Harp, MD;  Location: [facility] CATH LAB;  Service: Cardiovascular;  Laterality: N/A;  . LOWER EXTREMITY ANGIOGRAM N/A 12/25/2012   Procedure: LOWER EXTREMITY ANGIOGRAM;  Surgeon: Lorretta Harp, MD;  Location: [facility] CATH LAB;  Service: Cardiovascular;  Laterality: N/A;  . LOWER EXTREMITY INTERVENTION  05/08/2017  . LOWER EXTREMITY INTERVENTION Bilateral 05/08/2017   Procedure: LOWER EXTREMITY INTERVENTION;  Surgeon: Lorretta Harp, MD;  Location: [place] CV LAB;  Service: Cardiovascular;  Laterality: Bilateral;  . PERIPHERAL VASCULAR BALLOON  ANGIOPLASTY Right 05/08/2017   Procedure: PERIPHERAL VASCULAR BALLOON ANGIOPLASTY;  Surgeon: Lorretta Harp, MD;  Location: [place] CV LAB;  Service: Cardiovascular;  Laterality: Right;  sfa  . PERIPHERAL VASCULAR INTERVENTION Right 05/08/2017   Procedure: PERIPHERAL VASCULAR INTERVENTION;  Surgeon: Lorretta Harp, MD;  Location: [place] CV LAB;  Service: Cardiovascular;  Laterality: Right;  ext iliac   . PORTACATH PLACEMENT Right 11/22/2017   Procedure: INSERTION PORT-A-CATH;  Surgeon: Leighton Ruff, MD;  Location: [facility] ORS;  Service: General;  Laterality: Right;  . TOE SURGERY Right    2n toe   . UMBILICAL HERNIA REPAIR  2000  . UMBILICAL HERNIA REPAIR N/A 08/03/2018   Procedure: UMBILICAL HERNIA REPAIR;  Surgeon: Leighton Ruff, MD;  Location: [facility] ORS;  Service: General;  Laterality: N/A;  . XI ROBOTIC ASSISTED LOWER ANTERIOR RESECTION N/A 08/03/2018   Procedure: XI ROBOTIC ASSISTED LOWER ANTERIOR RESECTION; DIAGNOSTIC FLEXIBLE SIGMOIDOSCOPY; INTRAOPERATIVE ASSESSMENT OF VASCULAR PERFUSION;  Surgeon: Leighton Ruff, MD;  Location: [facility] ORS;  Service: General;  Laterality: N/A;   Family History  Problem Relation Age of Onset  . Hypertension Mother   . Stroke Mother   . Cancer Father        prostate  . Mental illness Sister   . Diabetes Brother   . Hypertension Brother   . Cancer Brother 87       prostate  . Diabetes Brother   . Hypertension Brother   . Cancer Brother        "tumors on neck and body"    Social History:  reports that she has quit smoking. Her smoking use included cigarettes. She has a 24.00 pack-year smoking history. She has never used smokeless tobacco. She reports that she does not drink alcohol or use drugs.  Allergies:  Allergies  Allergen Reactions  . Kiwi Extract Itching    Throat itching   . Lisinopril     Bruising    Medications: I have reviewed the patient's current medications.  Results for orders placed or performed during the hospital encounter of 10/01/18 (from the past 48 hour(s))  Urinalysis, Routine w reflex microscopic     Status: Abnormal   Collection Time: 10/01/18  7:36 PM  Result Value Ref Range   Color, Urine YELLOW YELLOW   APPearance HAZY (A) CLEAR   Specific Gravity, Urine 1.016 1.005 - 1.030   pH 5.0 5.0 - 8.0   Glucose, UA NEGATIVE NEGATIVE mg/dL   Hgb urine dipstick MODERATE (A) NEGATIVE   Bilirubin Urine NEGATIVE NEGATIVE    Ketones, ur NEGATIVE NEGATIVE mg/dL   Protein, ur NEGATIVE NEGATIVE mg/dL   Nitrite NEGATIVE NEGATIVE   Leukocytes,Ua MODERATE (A) NEGATIVE   RBC / HPF 6-10 0 - 5 RBC/hpf   WBC, UA 21-50 0 - 5 WBC/hpf   Bacteria, UA RARE (A) NONE SEEN   Squamous Epithelial / LPF 0-5 0 - 5   Mucus PRESENT    Hyaline Casts, UA PRESENT     Comment: Performed at [facility], [place] [address] [place], [place] [address]  Osmolality, urine     Status: None   Collection Time: 10/01/18  7:36 PM  Result Value Ref Range   Osmolality, Ur 387 300 - 900 mOsm/kg    Comment: Performed at [place] [address], [place], [place] [address]  Sodium, urine, random     Status: None   Collection Time: 10/01/18  7:36 PM  Result Value Ref Range  Sodium, Ur <10 mmol/L    Comment: Performed at [facility], [place] [address] [place], [place] [address]  Creatinine, urine, random     Status: None   Collection Time: 10/01/18  7:36 PM  Result Value Ref Range   Creatinine, Urine 368.91 mg/dL    Comment: Performed at [facility], [place] [address] [place], [place] [address]  Urine culture     Status: None   Collection Time: 10/01/18  7:37 PM   Specimen: Urine, Clean Catch  Result Value Ref Range   Specimen Description      URINE, CLEAN CATCH Performed at [facility], [place] [address] [place], [place] [address]    Special Requests      NONE Performed at [facility], [place] [address] [place], [place] [address]    Culture      NO GROWTH Performed at [facility], [place] [address], [place], [place] [address]    Report Status 10/02/2018 FINAL   Comprehensive metabolic panel     Status: Abnormal   Collection Time: 10/01/18  8:34 PM  Result Value Ref Range   Sodium 130 (L) 135 - 145 mmol/L   Potassium 2.4 (LL) 3.5 - 5.1 mmol/L    Comment: CRITICAL RESULT CALLED TO, READ BACK BY AND VERIFIED WITH: QUITA, RN @ 2110 ON  10/01/2018 C VARNER    Chloride 87 (L) 98 - 111 mmol/L   CO2 22 22 - 32 mmol/L   Glucose, Bld 316 (H) 70 - 99 mg/dL   BUN 156 (H) 8 - 23 mg/dL    Comment: RESULTS CONFIRMED BY MANUAL DILUTION   Creatinine, Ser 4.21 (H) 0.44 - 1.00 mg/dL   Calcium 10.3 8.9 - 10.3 mg/dL   Total Protein 8.2 (H) 6.5 - 8.1 g/dL   Albumin 4.4 3.5 - 5.0 g/dL   AST 22 15 - 41 U/L   ALT 15 0 - 44 U/L   Alkaline Phosphatase 91 38 - 126 U/L   Total Bilirubin 0.8 0.3 - 1.2 mg/dL   GFR calc non Af Amer 10 (L) >60 mL/min   GFR calc Af Amer 12 (L) >60 mL/min   Anion gap 21 (H) 5 - 15    Comment: Performed at [facility], [place] [address] [place], [place] [address]  CBC with Differential     Status: Abnormal   Collection Time: 10/01/18  8:34 PM  Result Value Ref Range   WBC 5.2 4.0 - 10.5 K/uL   RBC 3.99 3.87 - 5.11 MIL/uL   Hemoglobin 11.6 (L) 12.0 - 15.0 g/dL   HCT 34.3 (L) 36.0 - 46.0 %   MCV 86.0 80.0 - 100.0 fL   MCH 29.1 26.0 - 34.0 pg   MCHC 33.8 30.0 - 36.0 g/dL   RDW 14.1 11.5 - 15.5 %   Platelets 348 150 - 400 K/uL   nRBC 0.0 0.0 - 0.2 %   Neutrophils Relative % 85 %   Neutro Abs 4.4 1.7 - 7.7 K/uL   Lymphocytes Relative 7 %   Lymphs Abs 0.4 (L) 0.7 - 4.0 K/uL   Monocytes Relative 7 %   Monocytes Absolute 0.4 0.1 - 1.0 K/uL   Eosinophils Relative 1 %   Eosinophils Absolute 0.0 0.0 - 0.5 K/uL   Basophils Relative 0 %   Basophils Absolute 0.0 0.0 - 0.1 K/uL   Immature Granulocytes 0 %   Abs Immature Granulocytes 0.01 0.00 - 0.07  K/uL    Comment: Performed at [facility], [place] [address], [place], [place] [address]  Magnesium     Status: Abnormal   Collection Time: 10/01/18 11:30 PM  Result Value Ref Range   Magnesium 2.6 (H) 1.7 - 2.4 mg/dL    Comment: Performed at [facility], [place] [address] [place], [place] [address]  Phosphorus     Status: Abnormal   Collection Time: 10/01/18 11:30 PM  Result Value Ref Range   Phosphorus 7.4 (H)  2.5 - 4.6 mg/dL    Comment: Performed at [facility], [place] [address], [place], [place] [address]  CK     Status: None   Collection Time: 10/01/18 11:30 PM  Result Value Ref Range   Total CK 132 38 - 234 U/L    Comment: Performed at [facility], [place] [address], [place], [place] [address]  Glucose, capillary     Status: Abnormal   Collection Time: 10/02/18 12:23 AM  Result Value Ref Range   Glucose-Capillary 257 (H) 70 - 99 mg/dL  Prealbumin     Status: None   Collection Time: 10/02/18  4:09 AM  Result Value Ref Range   Prealbumin 36.8 18 - 38 mg/dL    Comment: Performed at [facility], [place] [address], [place], [place] [address]  Hemoglobin A1c     Status: Abnormal   Collection Time: 10/02/18  4:09 AM  Result Value Ref Range   Hgb A1c MFr Bld 6.7 (H) 4.8 - 5.6 %    Comment: (NOTE)         Prediabetes: 5.7 - 6.4         Diabetes: >6.4         Glycemic control for adults with diabetes: <7.0    Mean Plasma Glucose 146 mg/dL    Comment: (NOTE) Performed At: [facility] [place], [place] [id number] Rush Farmer MD [id number]   HIV antibody (Routine Testing)     Status: None   Collection Time: 10/02/18  4:09 AM  Result Value Ref Range   HIV Screen 4th Generation wRfx Non Reactive Non Reactive    Comment: (NOTE) Performed At: [facility] [place], [place] [id number] Rush Farmer MD [id number]   Magnesium     Status: Abnormal   Collection Time: 10/02/18  4:09 AM  Result Value Ref Range   Magnesium 2.6 (H) 1.7 - 2.4 mg/dL    Comment: Performed at [facility], [place] [address], [place], [place] [address]  Phosphorus     Status: Abnormal   Collection Time: 10/02/18  4:09 AM  Result Value Ref Range   Phosphorus 4.9 (H) 2.5 - 4.6 mg/dL    Comment: Performed at [facility], [place] [address] [place], [place] [address]  TSH      Status: None   Collection Time: 10/02/18  4:09 AM  Result Value Ref Range   TSH 0.968 0.350 - 4.500 uIU/mL    Comment: Performed by a 3rd Generation assay with a functional sensitivity of <=0.01 uIU/mL. Performed at [facility], [place] [address], [place], [place] [address]   Comprehensive metabolic panel     Status: Abnormal   Collection Time: 10/02/18  4:09 AM  Result Value Ref Range   Sodium 132 (L) 135 - 145 mmol/L   Potassium 2.3 (LL) 3.5 - 5.1 mmol/L    Comment: CRITICAL RESULT CALLED TO, READ BACK  BY AND VERIFIED WITH: Justice Rocher, RN @ 0510 ON 10/02/2018 C VARNER    Chloride 95 (L) 98 - 111 mmol/L   CO2 23 22 - 32 mmol/L   Glucose, Bld 213 (H) 70 - 99 mg/dL   BUN 140 (H) 8 - 23 mg/dL    Comment: RESULTS CONFIRMED BY MANUAL DILUTION   Creatinine, Ser 3.16 (H) 0.44 - 1.00 mg/dL   Calcium 9.7 8.9 - 10.3 mg/dL   Total Protein 7.1 6.5 - 8.1 g/dL   Albumin 3.8 3.5 - 5.0 g/dL   AST 23 15 - 41 U/L   ALT 14 0 - 44 U/L   Alkaline Phosphatase 77 38 - 126 U/L   Total Bilirubin 0.4 0.3 - 1.2 mg/dL   GFR calc non Af Amer 14 (L) >60 mL/min   GFR calc Af Amer 16 (L) >60 mL/min   Anion gap 14 5 - 15    Comment: Performed at [facility], [place] [address] [place], [place] [address]  CBC     Status: Abnormal   Collection Time: 10/02/18  4:09 AM  Result Value Ref Range   WBC 5.0 4.0 - 10.5 K/uL   RBC 3.61 (L) 3.87 - 5.11 MIL/uL   Hemoglobin 10.4 (L) 12.0 - 15.0 g/dL   HCT 31.2 (L) 36.0 - 46.0 %   MCV 86.4 80.0 - 100.0 fL   MCH 28.8 26.0 - 34.0 pg   MCHC 33.3 30.0 - 36.0 g/dL   RDW 14.3 11.5 - 15.5 %   Platelets 344 150 - 400 K/uL   nRBC 0.0 0.0 - 0.2 %    Comment: Performed at [facility], [place] [address] [place], [place] [address]  Glucose, capillary     Status: Abnormal   Collection Time: 10/02/18  5:46 AM  Result Value Ref Range   Glucose-Capillary 165 (H) 70 - 99 mg/dL  Glucose, capillary     Status: Abnormal    Collection Time: 10/02/18  7:46 AM  Result Value Ref Range   Glucose-Capillary 132 (H) 70 - 99 mg/dL  Glucose, capillary     Status: Abnormal   Collection Time: 10/02/18 12:08 PM  Result Value Ref Range   Glucose-Capillary 128 (H) 70 - 99 mg/dL  Potassium     Status: Abnormal   Collection Time: 10/02/18  2:18 PM  Result Value Ref Range   Potassium 2.7 (LL) 3.5 - 5.1 mmol/L    Comment: CRITICAL RESULT CALLED TO, READ BACK BY AND VERIFIED WITH: L.OWEN AT 1625 ON 10/02/18 BY N.THOMPSON Performed at [facility], [place] [address] [place], [place] [address]   Glucose, capillary     Status: Abnormal   Collection Time: 10/02/18  4:34 PM  Result Value Ref Range   Glucose-Capillary 220 (H) 70 - 99 mg/dL  Glucose, capillary     Status: Abnormal   Collection Time: 10/02/18  8:14 PM  Result Value Ref Range   Glucose-Capillary 234 (H) 70 - 99 mg/dL  Glucose, capillary     Status: Abnormal   Collection Time: 10/02/18 11:49 PM  Result Value Ref Range   Glucose-Capillary 254 (H) 70 - 99 mg/dL  Glucose, capillary     Status: None   Collection Time: 10/03/18  4:20 AM  Result Value Ref Range   Glucose-Capillary 76 70 - 99 mg/dL  Basic metabolic panel     Status: Abnormal   Collection Time: 10/03/18  5:30 AM  Result Value Ref Range  Sodium 137 135 - 145 mmol/L    Comment: REPEATED TO VERIFY   Potassium 2.9 (L) 3.5 - 5.1 mmol/L   Chloride 107 98 - 111 mmol/L   CO2 23 22 - 32 mmol/L   Glucose, Bld 128 (H) 70 - 99 mg/dL   BUN 112 (H) 8 - 23 mg/dL    Comment: RESULTS CONFIRMED BY MANUAL DILUTION   Creatinine, Ser 1.77 (H) 0.44 - 1.00 mg/dL    Comment: REPEATED TO VERIFY DELTA CHECK NOTED    Calcium 9.5 8.9 - 10.3 mg/dL   GFR calc non Af Amer 29 (L) >60 mL/min   GFR calc Af Amer 33 (L) >60 mL/min   Anion gap 7 5 - 15    Comment: Performed at [place] [address], [place], [place] [address]  CBC     Status: Abnormal   Collection Time: 10/03/18   5:30 AM  Result Value Ref Range   WBC 4.2 4.0 - 10.5 K/uL   RBC 3.38 (L) 3.87 - 5.11 MIL/uL   Hemoglobin 9.9 (L) 12.0 - 15.0 g/dL   HCT 30.2 (L) 36.0 - 46.0 %   MCV 89.3 80.0 - 100.0 fL   MCH 29.3 26.0 - 34.0 pg   MCHC 32.8 30.0 - 36.0 g/dL   RDW 14.8 11.5 - 15.5 %   Platelets 303 150 - 400 K/uL   nRBC 0.0 0.0 - 0.2 %    Comment: Performed at [facility], [place] [address] [place], [place] [address]  Magnesium     Status: None   Collection Time: 10/03/18  5:30 AM  Result Value Ref Range   Magnesium 2.3 1.7 - 2.4 mg/dL    Comment: Performed at [facility], [place] [address] [place], [place] [address]  Phosphorus     Status: None   Collection Time: 10/03/18  5:30 AM  Result Value Ref Range   Phosphorus 2.5 2.5 - 4.6 mg/dL    Comment: Performed at [facility], [place] [address], [place], [place] [address]  Hepatic function panel     Status: Abnormal   Collection Time: 10/03/18  5:30 AM  Result Value Ref Range   Total Protein 6.5 6.5 - 8.1 g/dL   Albumin 3.3 (L) 3.5 - 5.0 g/dL   AST 31 15 - 41 U/L   ALT 16 0 - 44 U/L   Alkaline Phosphatase 77 38 - 126 U/L   Total Bilirubin 0.4 0.3 - 1.2 mg/dL   Bilirubin, Direct 0.1 0.0 - 0.2 mg/dL   Indirect Bilirubin 0.3 0.3 - 0.9 mg/dL    Comment: Performed at [facility], [place] [address] [place], [place] [address]  Glucose, capillary     Status: Abnormal   Collection Time: 10/03/18  7:55 AM  Result Value Ref Range   Glucose-Capillary 176 (H) 70 - 99 mg/dL  Glucose, capillary     Status: Abnormal   Collection Time: 10/03/18 12:14 PM  Result Value Ref Range   Glucose-Capillary 181 (H) 70 - 99 mg/dL  Glucose, capillary     Status: Abnormal   Collection Time: 10/03/18  4:20 PM  Result Value Ref Range   Glucose-Capillary 169 (H) 70 - 99 mg/dL    Ct Head Wo Contrast  Result Date: 10/01/2018 CLINICAL DATA:  Failure to thrive EXAM: CT HEAD WITHOUT CONTRAST TECHNIQUE:  Contiguous axial images were obtained from the base of the skull through the vertex without intravenous contrast. COMPARISON:  11/05/2013 FINDINGS: Brain: Old right basal ganglia lacunar infarct. There is atrophy and chronic small vessel disease changes. No acute intracranial abnormality. Specifically, no hemorrhage, hydrocephalus, mass lesion, acute infarction, or significant intracranial injury. Vascular: No hyperdense vessel or unexpected calcification. Skull: No acute calvarial abnormality. Sinuses/Orbits: Visualized paranasal sinuses and mastoids clear. Orbital soft tissues unremarkable. Other: None IMPRESSION: Old right basal ganglia lacunar infarct. Atrophy, chronic microvascular disease. No acute intracranial abnormality. Electronically Signed   By: Rolm Baptise M.D.   On: 10/01/2018 23:24   US Renal  Result Date: 10/02/2018 CLINICAL DATA:  Acute renal injury. EXAM: RENAL / URINARY TRACT ULTRASOUND COMPLETE COMPARISON:  Abdomen series 10/01/2018.  CT 01/17/2018. FINDINGS: Right Kidney: Renal measurements: 8.2 x 4.3 x 4.5 cm = volume: 83.6 mL . Echogenicity within normal limits. No mass or hydronephrosis visualized. Left Kidney: Renal measurements: 9.8 x 3.8 x 4.1 cm = volume: 79.1 mL. Echogenicity within normal limits. No mass or hydronephrosis visualized. Bladder: Appears normal for degree of bladder distention. IMPRESSION: No acute or focal abnormality identified. Electronically Signed   By: Marcello Moores  Register   On: 10/02/2018 09:45   Dg Abd Acute 2+v W 1v Chest  Result Date: 10/01/2018 CLINICAL DATA:  Cough, abdominal distention EXAM: DG ABDOMEN ACUTE W/ 1V CHEST COMPARISON:  11/22/2017 chest x-ray.  Abdominal film 08/28/2018 FINDINGS: Right Port-A-Cath in place with the tip in the SVC, unchanged. Lungs clear. Heart is normal size. No effusions. Nonobstructive bowel gas pattern. No organomegaly, free air or suspicious calcification. Vascular calcifications noted. No acute bony abnormality.  IMPRESSION: No acute cardiopulmonary disease. No evidence of bowel obstruction or free air. Electronically Signed   By: Rolm Baptise M.D.   On: 10/01/2018 21:09   Dg Esophagus W Double Cm (hd)  Result Date: 10/03/2018 CLINICAL DATA:  Odynophagia. History of colon cancer with lower anterior resection. EXAM: ESOPHOGRAM/BARIUM SWALLOW TECHNIQUE: Combined double contrast and single contrast examination performed using effervescent crystals, thick barium liquid, and thin barium liquid. FLUOROSCOPY TIME:  Fluoroscopy Time:  2 minutes, 12 seconds Radiation Exposure Index (if provided by the fluoroscopic device): 9.7 mGy Number of Acquired Spot Images: 0 COMPARISON:  CT chest from 10/25/2017 FINDINGS: The patient is somewhat infirm and slow to move. I judged that with minor assistance she would be able to stand for example for the pharyngeal phase images and for double contrast images, but she did not have ideal mobility for the exam, and also refused to swallow the barium pill. We performed LPO rather than RAO images as I was skeptical she would be able to comfortably lay on her belly. On the pharyngeal phase of swallowing the patient retained barium in the mouth and only swallowed boluses of the oral barium at a time. There is an episode of laryngeal penetration, and also some mild vallecular stasis Starting in the lowest portion of the cervical esophagus but primarily involving the thoracic esophagus, we demonstrate shaggy nodular irregularity of the esophageal wall for example as shown on image 3/4. This has an appearance suggesting small marginal ulcerations and plaque-like lesions. There some sparing of the distal 25% of the thoracic esophagus. Nodularity in the distal esophagus is observed for example on image 33/8, which could be from polyp, ulceration, or a small mass. The filling defect measures about 7 by 5 mm on image 32/8. However, this was somewhat transient. Because the patient took small swallows and  possibly due to intrinsic disease, the distal esophagus and gastroesophageal junction remain narrow. I was only able  to distend these regions up to about 0.5 cm. Because the patient refused the barium pill I was unable to challenge the region with a larger non liquid bolus. The primary peristaltic waves in the esophagus were not disrupted. IMPRESSION: 1. Extensive esophagitis with small ulcerations and plaque-like lesions extending from the lower cervical esophagus to the lower thoracic esophagus. Top differential diagnostic considerations for this distribution and appearance include Candida esophagitis, cytomegalovirus esophagitis, herpes esophagitis, or glycogenic acanthosis. 2. Considerable narrowing of the distal esophagus. On series 8 there was a transient filling defect in this vicinity which may been incidental but could be from a varix or possibly even a mass. Because of the relatively small boluses the patient was able to swallow, I am not confident that I was able to fully fluid distend the distal esophagus, the patient was unable to swallow a barium pill. The possibility of a distal esophageal mass is not totally excluded, and after treatment for the patient's presumed esophagitis, consider either endoscopy or repeat esophagram to reassess the distal esophagus. Electronically Signed   By: Van Clines M.D.   On: 10/03/2018 10:13   Review of Systems  Constitutional: Positive for malaise/fatigue and weight loss. Negative for chills and fever.  HENT: Negative.   Eyes: Negative.   Respiratory: Negative.   Gastrointestinal: Positive for heartburn and nausea. Negative for abdominal pain, blood in stool, constipation, diarrhea, melena and vomiting.  Genitourinary: Negative.   Musculoskeletal: Positive for joint pain.  Skin: Negative.   Neurological: Positive for sensory change and weakness. Negative for dizziness, tingling, tremors, speech change, seizures, loss of consciousness and headaches.   Endo/Heme/Allergies: Negative.   Psychiatric/Behavioral: Negative.    Blood pressure 134/64, pulse 66, temperature 98.8 F (37.1 C), temperature source Oral, resp. rate 14, height 5\' 6"  (1.676 m), weight 70.8 kg, SpO2 100 %. Physical Exam  Constitutional: She is oriented to person, place, and time. She appears well-nourished.  HENT:  Head: Normocephalic and atraumatic.  Eyes: Pupils are equal, round, and reactive to light. Conjunctivae and EOM are normal.  Neck: Normal range of motion. Neck supple.  Cardiovascular: Normal rate and regular rhythm.  Respiratory: Effort normal and breath sounds normal.  Pot-a-cath noted on right chest  GI: Soft. Bowel sounds are normal.  Musculoskeletal: Normal range of motion.  Neurological: She is alert and oriented to person, place, and time.  Skin: Skin is warm and dry.  Psychiatric: She has a normal mood and affect. Her behavior is normal. Judgment and thought content normal.   Assessment/Plan: 1) Dysphagia/odynophagia with abnormal barium swallow-an EGD EGD is planned for the patient tomorrow.  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  3) Acute kidney injury that seems to be improving. 4) Hypokalemia/hyponatremia/hyperphosphatemia/hypermagnesia. 5) HTN. 6) AODM. 7) History of CVA. 8) Normocytic anemia. Juanita Craver 10/03/2018, 4:40 PM

## 2018-10-03 NOTE — Progress Notes (Addendum)
HEMATOLOGY-ONCOLOGY PROGRESS NOTE  SUBJECTIVE: More awake and alert this morning.  She is able to take in clear liquids without any difficulty.  Says she has a sore throat.  Denies abdominal pain, nausea, vomiting.  Denies constipation.  Oncology History Overview Note  Cancer Staging Rectal cancer [facility]) Staging form: Colon and Rectum, AJCC 8th Edition - Clinical stage from 10/04/2017: Stage IIIB (cT3, cN1, cM0) - Signed by Truitt Merle, MD on 11/18/2017     Rectal cancer ([place])  10/04/2017 Procedure   Colonoscopy per Dr. Collene Mares 10/04/17 shows a large, non-obstructing horse-shoe shaped mass was found in the recto-sigmoid colon at 10 cm; the mass was partially circumferential involving one-half of the lumen circumference. The mass measured seven cm in length   10/04/2017 Initial Biopsy   Rectum, at 10-17 cm, mass biopsy -invasive adenocarcinoma, moderately differentiated  ICH stains for MLH1, MSH2, MSH6, and PMS2 are intact (normal)   10/04/2017 Cancer Staging   Staging form: Colon and Rectum, AJCC 8th Edition - Clinical stage from 10/04/2017: Stage IIIB (cT3, cN1, cM0) - Signed by Truitt Merle, MD on 11/18/2017   10/25/2017 Imaging   CT CAP IMPRESSION: 1. Irregular eccentric mass within the sigmoid colon. There are at least 3 abnormal appearing lymph node adjacent to the sigmoid colon at the level of the mass concerning for the possibility of local nodal metastatic disease. 2. Multiple pulmonary nodules as described above. These are indeterminate in etiology. Recommend attention on follow-up.   11/06/2017 Initial Diagnosis   Adenocarcinoma of colon ([place])   11/16/2017 Imaging   Pelvic MRI 11/16/17  IMPRESSION: Rectal adenocarcinoma T stage:   T3 Rectal adenocarcinoma N stage:  N1 Distance from tumor to the anal sphincter is 6.9 cm.   11/29/2017 - 02/15/2018 Chemotherapy   totalneoadjuvant chemo FOLFOXq2 weeks x4 months, starting 11/29/17-03/07/18, followed by concurrent chemoRT. Completed 8 cycles  of FOLFOX on 03/07/18   01/17/2018 Imaging   CT AP W Contrast 01/17/18 IMPRESSION: 1. Previously seen soft tissue mass involving the left lateral wall the upper rectum is no longer visualized. 2. Decreased size of left perirectal lymph nodes since prior exam. 3. No other sites of metastatic disease within the abdomen or pelvis. 4. Colonic diverticulosis, without radiographic evidence of diverticulitis. 5. Stable small adjacent paraumbilical hernias containing only fat.   04/09/2018 - 05/17/2018 Chemotherapy   Concurrent ChemoRT with Xeloda '1500mg'$  BID on days of radiation M-F starting 04/09/18. Held week of 04/30/18 due to dirrhea. Restarted 05/07/18 at reduced dose of '1000mg'$  in the AM and 1000-'1500mg'$  in the PM. Completed 05/17/18.    04/09/2018 - 05/17/2018 Radiation Therapy   concurrent chemoRT with Dr. Lisbeth Renshaw   04/09/18-05/17/18      REVIEW OF SYSTEMS:   A 14 point review of systems was negative except as noted in the HPI.  I have reviewed the past medical history, past surgical history, social history and family history with the patient and they are unchanged from previous note.   PHYSICAL EXAMINATION: ECOG PERFORMANCE STATUS: 2 - Symptomatic, <50% confined to bed  Vitals:   10/03/18 0418 10/03/18 0758  BP: 134/61 125/60  Pulse: 70 68  Resp: 20 12  Temp: 98.6 F (37 C) 98.7 F (37.1 C)  SpO2: 100% 97%   Filed Weights   10/01/18 1324  Weight: 156 lb (70.8 kg)    Intake/Output from previous day: 09/22 0701 - 09/23 0700 In: 896.3 [I.V.:398.3; IV Piggyback:497.9] Out: 300 [Stool:300]  GENERAL: Awake and alert, no distress. EYES: normal, Conjunctiva are  pink and non-injected, sclera clear OROPHARYNX: No mucositis.  [name] NECK: supple, thyroid normal size, non-tender, without nodularity LYMPH:  no palpable lymphadenopathy in the cervical, axillary or inguinal LUNGS: clear to auscultation and percussion with normal breathing effort HEART: regular rate & rhythm  and no murmurs and no lower extremity edema ABDOMEN: Positive bowel sounds, soft, nontender.  Colostomy with brown stool. Musculoskeletal:no cyanosis of digits and no clubbing  NEURO: Alert and oriented x3  LABORATORY DATA:  I have reviewed the data as listed CMP Latest Ref Rng & Units 10/03/2018 10/02/2018 10/02/2018  Glucose 70 - 99 mg/dL 128(H) - 213(H)  BUN 8 - 23 mg/dL 112(H) - 140(H)  Creatinine 0.44 - 1.00 mg/dL 1.77(H) - 3.16(H)  Sodium 135 - 145 mmol/L 137 - 132(L)  Potassium 3.5 - 5.1 mmol/L 2.9(L) 2.7(LL) 2.3(LL)  Chloride 98 - 111 mmol/L 107 - 95(L)  CO2 22 - 32 mmol/L 23 - 23  Calcium 8.9 - 10.3 mg/dL 9.5 - 9.7  Total Protein 6.5 - 8.1 g/dL 6.5 - 7.1  Total Bilirubin 0.3 - 1.2 mg/dL 0.4 - 0.4  Alkaline Phos 38 - 126 U/L 77 - 77  AST 15 - 41 U/L 31 - 23  ALT 0 - 44 U/L 16 - 14    Lab Results  Component Value Date   WBC 4.2 10/03/2018   HGB 9.9 (L) 10/03/2018   HCT 30.2 (L) 10/03/2018   MCV 89.3 10/03/2018   PLT 303 10/03/2018   NEUTROABS 4.4 10/01/2018    Ct Head Wo Contrast  Result Date: 10/01/2018 CLINICAL DATA:  Failure to thrive EXAM: CT HEAD WITHOUT CONTRAST TECHNIQUE: Contiguous axial images were obtained from the base of the skull through the vertex without intravenous contrast. COMPARISON:  11/05/2013 FINDINGS: Brain: Old right basal ganglia lacunar infarct. There is atrophy and chronic small vessel disease changes. No acute intracranial abnormality. Specifically, no hemorrhage, hydrocephalus, mass lesion, acute infarction, or significant intracranial injury. Vascular: No hyperdense vessel or unexpected calcification. Skull: No acute calvarial abnormality. Sinuses/Orbits: Visualized paranasal sinuses and mastoids clear. Orbital soft tissues unremarkable. Other: None IMPRESSION: Old right basal ganglia lacunar infarct. Atrophy, chronic microvascular disease. No acute intracranial abnormality. Electronically Signed   By: Rolm Baptise M.D.   On: 10/01/2018 23:24    US Renal  Result Date: 10/02/2018 CLINICAL DATA:  Acute renal injury. EXAM: RENAL / URINARY TRACT ULTRASOUND COMPLETE COMPARISON:  Abdomen series 10/01/2018.  CT 01/17/2018. FINDINGS: Right Kidney: Renal measurements: 8.2 x 4.3 x 4.5 cm = volume: 83.6 mL . Echogenicity within normal limits. No mass or hydronephrosis visualized. Left Kidney: Renal measurements: 9.8 x 3.8 x 4.1 cm = volume: 79.1 mL. Echogenicity within normal limits. No mass or hydronephrosis visualized. Bladder: Appears normal for degree of bladder distention. IMPRESSION: No acute or focal abnormality identified. Electronically Signed   By: Marcello Moores  Register   On: 10/02/2018 09:45   Dg Abd Acute 2+v W 1v Chest  Result Date: 10/01/2018 CLINICAL DATA:  Cough, abdominal distention EXAM: DG ABDOMEN ACUTE W/ 1V CHEST COMPARISON:  11/22/2017 chest x-ray.  Abdominal film 08/28/2018 FINDINGS: Right Port-A-Cath in place with the tip in the SVC, unchanged. Lungs clear. Heart is normal size. No effusions. Nonobstructive bowel gas pattern. No organomegaly, free air or suspicious calcification. Vascular calcifications noted. No acute bony abnormality. IMPRESSION: No acute cardiopulmonary disease. No evidence of bowel obstruction or free air. Electronically Signed   By: Rolm Baptise M.D.   On: 10/01/2018 21:09  ASSESSMENT AND PLAN: 1.  Rectal adenocarcinoma, status post neoadjuvant chemotherapy followed by lower anterior resection with loop ileostomy 2.  Dehydration 3.  AKI secondary to dehydration 4.  Hypokalemia 5.  Normocytic anemia 6.  Oral candidiasis  -The patient is currently on observation from a rectal cancer standpoint.  We will plan for outpatient follow-up for routine monitoring. -Patient is dehydrated related to decreased p.o. intake.  IV fluids per hospitalist.  She is now taking in more oral fluids.  Recommend dietitian consult. -AKI secondary to dehydration.  Continues to improve with increase in oral intake and IV  hydration. -Replete potassium per hospitalist. -Hemoglobin is stable.  No need for transfusion at this time. -We will begin fluconazole 100 mg daily for 7 days for oral candidiasis.    LOS: 2 days   Mikey Bussing, DNP, AGPCNP-BC, AOCNP 10/03/18  Addendum  I have seen the patient, examined her. I agree with the assessment and and plan and have edited the notes.   Nicole Sellers was admitted for dehydration, AKI and oral thrush. She is doing much better today, AKI improved. Continue supportive care, I appreciate the excellent care from hospitlaist team.   Truitt Merle  10/03/2018

## 2018-10-03 NOTE — Consult Note (Addendum)
Reason for Consult: Difficulty swallowing/abnormal barium swallow. Referring Physician: [facility] hospitalist.  Nicole Sellers is an 68 y.o. female.  HPI: Nicole Sellers is a 68 year old black female with multiple medical problems listed below.  She was diagnosed with stage IIIb rectal cancer in November 2019 for which she has completed 8 cycles of FOLFOX every 2 weeks for 4 months followed by concurrent chemoradiation.  In August of this year she underwent a low anterior resection.  She was admitted to the hospital with a sore throat and difficulty swallowing along with generalized weakness and a 10 to 15 pound weight loss.  On detailed questioning she says she has had this problem for several months but has never discussed this with any of her providers.  Patient was to have found to have acute kidney injury on admission and had a barium swallow that showed extensive esophagitis with ulcerations and narrowing of the distal esophagus ?mass.  Patient is been maintained on a clear liquid diet and GI consultation was requested.  She has 1-3 bowel movements per day without any evidence of melena hematochezia her oral intake has been significantly decreased because of the dysphasia//odynophagia and and she has lost about 10 pounds in the last couple of months.   Past Medical History:  Diagnosis Date  . Age-related nuclear cataract, right eye   . Allergy   . Anemia   . Arthritis    "joints sometimes" (12/25/2012)  . Clotting disorder ([place])   . Colon cancer ([place])   . Critical lower limb ischemia   . Diverticulosis   . Gangrene of toe, Rt second toe 12/25/2012  . GERD (gastroesophageal reflux disease)   . High cholesterol   . Hypertension   . Hypothyroidism   . PAD (peripheral artery disease) ([place])   . PAD (peripheral artery disease) ([place])   . Peripheral neuropathy   . Rectal cancer ([place])   . S/P arterial stent, 12/25/13, successful diamondback orbital rotational arthrectomy, PTA using chocolate   balloon and stenting using I DEV stent of long segment calcified high-grade proximal and mid r 12/25/2012  . Tobacco abuse   . Type II diabetes mellitus ([place])     Past Surgical History:  Procedure Laterality Date  . ABDOMINAL AORTAGRAM  12/20/2012   Procedure: ABDOMINAL AORTAGRAM;  Surgeon: Lorretta Harp, MD;  Location: [facility] CATH LAB;  Service: Cardiovascular;;  . ABDOMINAL HYSTERECTOMY  2000  . ANGIOPLASTY / STENTING FEMORAL Right 12/25/2012  . ATHERECTOMY Right 12/25/2012   Procedure: ATHERECTOMY;  Surgeon: Lorretta Harp, MD;  Location: [facility] CATH LAB;  Service: Cardiovascular;  Laterality: Right;  right SFA  . COLONOSCOPY    . DIVERTING ILEOSTOMY N/A 08/03/2018   Procedure: DIVERTING LOOP ILEOSTOMY;  Surgeon: Leighton Ruff, MD;  Location: [facility] ORS;  Service: General;  Laterality: N/A;  . EYE SURGERY Left    cataract  . HERNIA REPAIR    . LOWER EXTREMITY ANGIOGRAM N/A 12/20/2012   Procedure: LOWER EXTREMITY ANGIOGRAM;  Surgeon: Lorretta Harp, MD;  Location: [facility] CATH LAB;  Service: Cardiovascular;  Laterality: N/A;  . LOWER EXTREMITY ANGIOGRAM N/A 12/25/2012   Procedure: LOWER EXTREMITY ANGIOGRAM;  Surgeon: Lorretta Harp, MD;  Location: [facility] CATH LAB;  Service: Cardiovascular;  Laterality: N/A;  . LOWER EXTREMITY INTERVENTION  05/08/2017  . LOWER EXTREMITY INTERVENTION Bilateral 05/08/2017   Procedure: LOWER EXTREMITY INTERVENTION;  Surgeon: Lorretta Harp, MD;  Location: [facility] CV LAB;  Service: Cardiovascular;  Laterality: Bilateral;  . PERIPHERAL VASCULAR BALLOON  ANGIOPLASTY Right 05/08/2017   Procedure: PERIPHERAL VASCULAR BALLOON ANGIOPLASTY;  Surgeon: Lorretta Harp, MD;  Location: [place] CV LAB;  Service: Cardiovascular;  Laterality: Right;  sfa  . PERIPHERAL VASCULAR INTERVENTION Right 05/08/2017   Procedure: PERIPHERAL VASCULAR INTERVENTION;  Surgeon: Lorretta Harp, MD;  Location: [place] CV LAB;  Service: Cardiovascular;  Laterality: Right;  ext iliac   . PORTACATH PLACEMENT Right 11/22/2017   Procedure: INSERTION PORT-A-CATH;  Surgeon: Leighton Ruff, MD;  [facility];  Service: General;  Laterality: Right;  . TOE SURGERY Right    2n toe   . UMBILICAL HERNIA REPAIR  2000  . UMBILICAL HERNIA REPAIR N/A 08/03/2018   Procedure: UMBILICAL HERNIA REPAIR;  Surgeon: Leighton Ruff, MD;  [facility];  Service: General;  Laterality: N/A;  . XI ROBOTIC ASSISTED LOWER ANTERIOR RESECTION N/A 08/03/2018   Procedure: XI ROBOTIC ASSISTED LOWER ANTERIOR RESECTION; DIAGNOSTIC FLEXIBLE SIGMOIDOSCOPY; INTRAOPERATIVE ASSESSMENT OF VASCULAR PERFUSION;  Surgeon: Leighton Ruff, MD;  [facility];  Service: General;  Laterality: N/A;   Family History  Problem Relation Age of Onset  . Hypertension Mother   . Stroke Mother   . Cancer Father        prostate  . Mental illness Sister   . Diabetes Brother   . Hypertension Brother   . Cancer Brother 20       prostate  . Diabetes Brother   . Hypertension Brother   . Cancer Brother        "tumors on neck and body"    Social History:  reports that she has quit smoking. Her smoking use included cigarettes. She has a 24.00 pack-year smoking history. She has never used smokeless tobacco. She reports that she does not drink alcohol or use drugs.  Allergies:  Allergies  Allergen Reactions  . Kiwi Extract Itching    Throat itching   . Lisinopril     Bruising    Medications: I have reviewed the patient's current medications.  Results for orders placed or performed during the hospital encounter of 10/01/18 (from the past 48 hour(s))  Urinalysis, Routine w reflex microscopic     Status: Abnormal   Collection Time: 10/01/18  7:36 PM  Result Value Ref Range   Color, Urine YELLOW YELLOW   APPearance HAZY (A) CLEAR   Specific Gravity, Urine 1.016 1.005 - 1.030   pH 5.0 5.0 - 8.0   Glucose, UA NEGATIVE NEGATIVE mg/dL   Hgb urine dipstick MODERATE (A) NEGATIVE   Bilirubin Urine NEGATIVE NEGATIVE    Ketones, ur NEGATIVE NEGATIVE mg/dL   Protein, ur NEGATIVE NEGATIVE mg/dL   Nitrite NEGATIVE NEGATIVE   Leukocytes,Ua MODERATE (A) NEGATIVE   RBC / HPF 6-10 0 - 5 RBC/hpf   WBC, UA 21-50 0 - 5 WBC/hpf   Bacteria, UA RARE (A) NONE SEEN   Squamous Epithelial / LPF 0-5 0 - 5   Mucus PRESENT    Hyaline Casts, UA PRESENT     Comment: Performed at [facility], [place] [address], [place], [place] [address]  Osmolality, urine     Status: None   Collection Time: 10/01/18  7:36 PM  Result Value Ref Range   Osmolality, Ur 387 300 - 900 mOsm/kg    Comment: Performed at [place] [address] [place], [place] [address]  Sodium, urine, random     Status: None   Collection Time: 10/01/18  7:36 PM  Result Value Ref Range  Sodium, Ur <10 mmol/L    Comment: Performed at [facility], [place] [address], [place], [place] [address]  Creatinine, urine, random     Status: None   Collection Time: 10/01/18  7:36 PM  Result Value Ref Range   Creatinine, Urine 368.91 mg/dL    Comment: Performed at [facility], [place] [address], [place], [place] [address]  Urine culture     Status: None   Collection Time: 10/01/18  7:37 PM   Specimen: Urine, Clean Catch  Result Value Ref Range   Specimen Description      URINE, CLEAN CATCH Performed at [facility], [place] [address] [place], [place] [address]    Special Requests      NONE Performed at [facility], [place] [address] [place], [place] [address]    Culture      NO GROWTH Performed at [facility], [place] [address] [place], [place] [address]    Report Status 10/02/2018 FINAL   Comprehensive metabolic panel     Status: Abnormal   Collection Time: 10/01/18  8:34 PM  Result Value Ref Range   Sodium 130 (L) 135 - 145 mmol/L   Potassium 2.4 (LL) 3.5 - 5.1 mmol/L    Comment: CRITICAL RESULT CALLED TO, READ BACK BY AND VERIFIED WITH: QUITA, RN @ 2110 ON  10/01/2018 C VARNER    Chloride 87 (L) 98 - 111 mmol/L   CO2 22 22 - 32 mmol/L   Glucose, Bld 316 (H) 70 - 99 mg/dL   BUN 156 (H) 8 - 23 mg/dL    Comment: RESULTS CONFIRMED BY MANUAL DILUTION   Creatinine, Ser 4.21 (H) 0.44 - 1.00 mg/dL   Calcium 10.3 8.9 - 10.3 mg/dL   Total Protein 8.2 (H) 6.5 - 8.1 g/dL   Albumin 4.4 3.5 - 5.0 g/dL   AST 22 15 - 41 U/L   ALT 15 0 - 44 U/L   Alkaline Phosphatase 91 38 - 126 U/L   Total Bilirubin 0.8 0.3 - 1.2 mg/dL   GFR calc non Af Amer 10 (L) >60 mL/min   GFR calc Af Amer 12 (L) >60 mL/min   Anion gap 21 (H) 5 - 15    Comment: Performed at [facility], [place] [address] [place],  [address]  CBC with Differential     Status: Abnormal   Collection Time: 10/01/18  8:34 PM  Result Value Ref Range   WBC 5.2 4.0 - 10.5 K/uL   RBC 3.99 3.87 - 5.11 MIL/uL   Hemoglobin 11.6 (L) 12.0 - 15.0 g/dL   HCT 34.3 (L) 36.0 - 46.0 %   MCV 86.0 80.0 - 100.0 fL   MCH 29.1 26.0 - 34.0 pg   MCHC 33.8 30.0 - 36.0 g/dL   RDW 14.1 11.5 - 15.5 %   Platelets 348 150 - 400 K/uL   nRBC 0.0 0.0 - 0.2 %   Neutrophils Relative % 85 %   Neutro Abs 4.4 1.7 - 7.7 K/uL   Lymphocytes Relative 7 %   Lymphs Abs 0.4 (L) 0.7 - 4.0 K/uL   Monocytes Relative 7 %   Monocytes Absolute 0.4 0.1 - 1.0 K/uL   Eosinophils Relative 1 %   Eosinophils Absolute 0.0 0.0 - 0.5 K/uL   Basophils Relative 0 %   Basophils Absolute 0.0 0.0 - 0.1 K/uL   Immature Granulocytes 0 %   Abs Immature Granulocytes 0.01 0.00 - 0.07  K/uL    Comment: Performed at [facility], [place] [address] [place], [place] [address]  Magnesium     Status: Abnormal   Collection Time: 10/01/18 11:30 PM  Result Value Ref Range   Magnesium 2.6 (H) 1.7 - 2.4 mg/dL    Comment: Performed at [facility], [place] [address], [place], [place] [address]  Phosphorus     Status: Abnormal   Collection Time: 10/01/18 11:30 PM  Result Value Ref Range   Phosphorus 7.4 (H)  2.5 - 4.6 mg/dL    Comment: Performed at [facility], [place] [address] [place], [place] [address]  CK     Status: None   Collection Time: 10/01/18 11:30 PM  Result Value Ref Range   Total CK 132 38 - 234 U/L    Comment: Performed at [facility], [place] [address], [place], [place] [address]  Glucose, capillary     Status: Abnormal   Collection Time: 10/02/18 12:23 AM  Result Value Ref Range   Glucose-Capillary 257 (H) 70 - 99 mg/dL  Prealbumin     Status: None   Collection Time: 10/02/18  4:09 AM  Result Value Ref Range   Prealbumin 36.8 18 - 38 mg/dL    Comment: Performed at [facility], [place] [address] [place], [place] [address]  Hemoglobin A1c     Status: Abnormal   Collection Time: 10/02/18  4:09 AM  Result Value Ref Range   Hgb A1c MFr Bld 6.7 (H) 4.8 - 5.6 %    Comment: (NOTE)         Prediabetes: 5.7 - 6.4         Diabetes: >6.4         Glycemic control for adults with diabetes: <7.0    Mean Plasma Glucose 146 mg/dL    Comment: (NOTE) Performed At: [facility] [place], [place] [id number] Rush Farmer MD RC:[id number]   HIV antibody (Routine Testing)     Status: None   Collection Time: 10/02/18  4:09 AM  Result Value Ref Range   HIV Screen 4th Generation wRfx Non Reactive Non Reactive    Comment: (NOTE) Performed At: [facility] [place], [place] [id number] Rush Farmer MD OE:[id number]   Magnesium     Status: Abnormal   Collection Time: 10/02/18  4:09 AM  Result Value Ref Range   Magnesium 2.6 (H) 1.7 - 2.4 mg/dL    Comment: Performed at [facility], [place] [address], [place], [place] [address]  Phosphorus     Status: Abnormal   Collection Time: 10/02/18  4:09 AM  Result Value Ref Range   Phosphorus 4.9 (H) 2.5 - 4.6 mg/dL    Comment: Performed at [facility] [place] [address], [place], [place] [address]  TSH      Status: None   Collection Time: 10/02/18  4:09 AM  Result Value Ref Range   TSH 0.968 0.350 - 4.500 uIU/mL    Comment: Performed by a 3rd Generation assay with a functional sensitivity of <=0.01 uIU/mL. Performed at [facility], [place] [address] [place],  [address]   Comprehensive metabolic panel     Status: Abnormal   Collection Time: 10/02/18  4:09 AM  Result Value Ref Range   Sodium 132 (L) 135 - 145 mmol/L   Potassium 2.3 (LL) 3.5 - 5.1 mmol/L    Comment: CRITICAL RESULT CALLED TO, READ BACK  BY AND VERIFIED WITH: Justice Rocher, RN @ 0510 ON 10/02/2018 C VARNER    Chloride 95 (L) 98 - 111 mmol/L   CO2 23 22 - 32 mmol/L   Glucose, Bld 213 (H) 70 - 99 mg/dL   BUN 140 (H) 8 - 23 mg/dL    Comment: RESULTS CONFIRMED BY MANUAL DILUTION   Creatinine, Ser 3.16 (H) 0.44 - 1.00 mg/dL   Calcium 9.7 8.9 - 10.3 mg/dL   Total Protein 7.1 6.5 - 8.1 g/dL   Albumin 3.8 3.5 - 5.0 g/dL   AST 23 15 - 41 U/L   ALT 14 0 - 44 U/L   Alkaline Phosphatase 77 38 - 126 U/L   Total Bilirubin 0.4 0.3 - 1.2 mg/dL   GFR calc non Af Amer 14 (L) >60 mL/min   GFR calc Af Amer 16 (L) >60 mL/min   Anion gap 14 5 - 15    Comment: Performed at [facility], [place] [address], [place], [place] [address]  CBC     Status: Abnormal   Collection Time: 10/02/18  4:09 AM  Result Value Ref Range   WBC 5.0 4.0 - 10.5 K/uL   RBC 3.61 (L) 3.87 - 5.11 MIL/uL   Hemoglobin 10.4 (L) 12.0 - 15.0 g/dL   HCT 31.2 (L) 36.0 - 46.0 %   MCV 86.4 80.0 - 100.0 fL   MCH 28.8 26.0 - 34.0 pg   MCHC 33.3 30.0 - 36.0 g/dL   RDW 14.3 11.5 - 15.5 %   Platelets 344 150 - 400 K/uL   nRBC 0.0 0.0 - 0.2 %    Comment: Performed at [facility], [place] [address] [place], [place] [address]  Glucose, capillary     Status: Abnormal   Collection Time: 10/02/18  5:46 AM  Result Value Ref Range   Glucose-Capillary 165 (H) 70 - 99 mg/dL  Glucose, capillary     Status: Abnormal    Collection Time: 10/02/18  7:46 AM  Result Value Ref Range   Glucose-Capillary 132 (H) 70 - 99 mg/dL  Glucose, capillary     Status: Abnormal   Collection Time: 10/02/18 12:08 PM  Result Value Ref Range   Glucose-Capillary 128 (H) 70 - 99 mg/dL  Potassium     Status: Abnormal   Collection Time: 10/02/18  2:18 PM  Result Value Ref Range   Potassium 2.7 (LL) 3.5 - 5.1 mmol/L    Comment: CRITICAL RESULT CALLED TO, READ BACK BY AND VERIFIED WITH: L.OWEN AT 1625 ON 10/02/18 BY N.THOMPSON Performed at [facility], [place] [address], [place], [place] [address]   Glucose, capillary     Status: Abnormal   Collection Time: 10/02/18  4:34 PM  Result Value Ref Range   Glucose-Capillary 220 (H) 70 - 99 mg/dL  Glucose, capillary     Status: Abnormal   Collection Time: 10/02/18  8:14 PM  Result Value Ref Range   Glucose-Capillary 234 (H) 70 - 99 mg/dL  Glucose, capillary     Status: Abnormal   Collection Time: 10/02/18 11:49 PM  Result Value Ref Range   Glucose-Capillary 254 (H) 70 - 99 mg/dL  Glucose, capillary     Status: None   Collection Time: 10/03/18  4:20 AM  Result Value Ref Range   Glucose-Capillary 76 70 - 99 mg/dL  Basic metabolic panel     Status: Abnormal   Collection Time: 10/03/18  5:30 AM  Result Value Ref Range  Sodium 137 135 - 145 mmol/L    Comment: REPEATED TO VERIFY   Potassium 2.9 (L) 3.5 - 5.1 mmol/L   Chloride 107 98 - 111 mmol/L   CO2 23 22 - 32 mmol/L   Glucose, Bld 128 (H) 70 - 99 mg/dL   BUN 112 (H) 8 - 23 mg/dL    Comment: RESULTS CONFIRMED BY MANUAL DILUTION   Creatinine, Ser 1.77 (H) 0.44 - 1.00 mg/dL    Comment: REPEATED TO VERIFY DELTA CHECK NOTED    Calcium 9.5 8.9 - 10.3 mg/dL   GFR calc non Af Amer 29 (L) >60 mL/min   GFR calc Af Amer 33 (L) >60 mL/min   Anion gap 7 5 - 15    Comment: Performed at [place] [address], [place], [place] [address]  CBC     Status: Abnormal   Collection Time: 10/03/18   5:30 AM  Result Value Ref Range   WBC 4.2 4.0 - 10.5 K/uL   RBC 3.38 (L) 3.87 - 5.11 MIL/uL   Hemoglobin 9.9 (L) 12.0 - 15.0 g/dL   HCT 30.2 (L) 36.0 - 46.0 %   MCV 89.3 80.0 - 100.0 fL   MCH 29.3 26.0 - 34.0 pg   MCHC 32.8 30.0 - 36.0 g/dL   RDW 14.8 11.5 - 15.5 %   Platelets 303 150 - 400 K/uL   nRBC 0.0 0.0 - 0.2 %    Comment: Performed at [facility], [place] [address], [place], [place] [address]  Magnesium     Status: None   Collection Time: 10/03/18  5:30 AM  Result Value Ref Range   Magnesium 2.3 1.7 - 2.4 mg/dL    Comment: Performed at [facility], [place] [address], [place], [place] [address]  Phosphorus     Status: None   Collection Time: 10/03/18  5:30 AM  Result Value Ref Range   Phosphorus 2.5 2.5 - 4.6 mg/dL    Comment: Performed at [facility], [place] [address], [place], [place] [address]  Hepatic function panel     Status: Abnormal   Collection Time: 10/03/18  5:30 AM  Result Value Ref Range   Total Protein 6.5 6.5 - 8.1 g/dL   Albumin 3.3 (L) 3.5 - 5.0 g/dL   AST 31 15 - 41 U/L   ALT 16 0 - 44 U/L   Alkaline Phosphatase 77 38 - 126 U/L   Total Bilirubin 0.4 0.3 - 1.2 mg/dL   Bilirubin, Direct 0.1 0.0 - 0.2 mg/dL   Indirect Bilirubin 0.3 0.3 - 0.9 mg/dL    Comment: Performed at [facility], [place] [address] [place], [place] [address]  Glucose, capillary     Status: Abnormal   Collection Time: 10/03/18  7:55 AM  Result Value Ref Range   Glucose-Capillary 176 (H) 70 - 99 mg/dL  Glucose, capillary     Status: Abnormal   Collection Time: 10/03/18 12:14 PM  Result Value Ref Range   Glucose-Capillary 181 (H) 70 - 99 mg/dL  Glucose, capillary     Status: Abnormal   Collection Time: 10/03/18  4:20 PM  Result Value Ref Range   Glucose-Capillary 169 (H) 70 - 99 mg/dL    Ct Head Wo Contrast  Result Date: 10/01/2018 CLINICAL DATA:  Failure to thrive EXAM: CT HEAD WITHOUT CONTRAST TECHNIQUE:  Contiguous axial images were obtained from the base of the skull through the vertex without intravenous contrast. COMPARISON:  11/05/2013 FINDINGS: Brain: Old right basal ganglia lacunar infarct. There is atrophy and chronic small vessel disease changes. No acute intracranial abnormality. Specifically, no hemorrhage, hydrocephalus, mass lesion, acute infarction, or significant intracranial injury. Vascular: No hyperdense vessel or unexpected calcification. Skull: No acute calvarial abnormality. Sinuses/Orbits: Visualized paranasal sinuses and mastoids clear. Orbital soft tissues unremarkable. Other: None IMPRESSION: Old right basal ganglia lacunar infarct. Atrophy, chronic microvascular disease. No acute intracranial abnormality. Electronically Signed   By: Rolm Baptise M.D.   On: 10/01/2018 23:24   US Renal  Result Date: 10/02/2018 CLINICAL DATA:  Acute renal injury. EXAM: RENAL / URINARY TRACT ULTRASOUND COMPLETE COMPARISON:  Abdomen series 10/01/2018.  CT 01/17/2018. FINDINGS: Right Kidney: Renal measurements: 8.2 x 4.3 x 4.5 cm = volume: 83.6 mL . Echogenicity within normal limits. No mass or hydronephrosis visualized. Left Kidney: Renal measurements: 9.8 x 3.8 x 4.1 cm = volume: 79.1 mL. Echogenicity within normal limits. No mass or hydronephrosis visualized. Bladder: Appears normal for degree of bladder distention. IMPRESSION: No acute or focal abnormality identified. Electronically Signed   By: Marcello Moores  Register   On: 10/02/2018 09:45   Dg Abd Acute 2+v W 1v Chest  Result Date: 10/01/2018 CLINICAL DATA:  Cough, abdominal distention EXAM: DG ABDOMEN ACUTE W/ 1V CHEST COMPARISON:  11/22/2017 chest x-ray.  Abdominal film 08/28/2018 FINDINGS: Right Port-A-Cath in place with the tip in the SVC, unchanged. Lungs clear. Heart is normal size. No effusions. Nonobstructive bowel gas pattern. No organomegaly, free air or suspicious calcification. Vascular calcifications noted. No acute bony abnormality.  IMPRESSION: No acute cardiopulmonary disease. No evidence of bowel obstruction or free air. Electronically Signed   By: Rolm Baptise M.D.   On: 10/01/2018 21:09   Dg Esophagus W Double Cm (hd)  Result Date: 10/03/2018 CLINICAL DATA:  Odynophagia. History of colon cancer with lower anterior resection. EXAM: ESOPHOGRAM/BARIUM SWALLOW TECHNIQUE: Combined double contrast and single contrast examination performed using effervescent crystals, thick barium liquid, and thin barium liquid. FLUOROSCOPY TIME:  Fluoroscopy Time:  2 minutes, 12 seconds Radiation Exposure Index (if provided by the fluoroscopic device): 9.7 mGy Number of Acquired Spot Images: 0 COMPARISON:  CT chest from 10/25/2017 FINDINGS: The patient is somewhat infirm and slow to move. I judged that with minor assistance she would be able to stand for example for the pharyngeal phase images and for double contrast images, but she did not have ideal mobility for the exam, and also refused to swallow the barium pill. We performed LPO rather than RAO images as I was skeptical she would be able to comfortably lay on her belly. On the pharyngeal phase of swallowing the patient retained barium in the mouth and only swallowed boluses of the oral barium at a time. There is an episode of laryngeal penetration, and also some mild vallecular stasis Starting in the lowest portion of the cervical esophagus but primarily involving the thoracic esophagus, we demonstrate shaggy nodular irregularity of the esophageal wall for example as shown on image 3/4. This has an appearance suggesting small marginal ulcerations and plaque-like lesions. There some sparing of the distal 25% of the thoracic esophagus. Nodularity in the distal esophagus is observed for example on image 33/8, which could be from polyp, ulceration, or a small mass. The filling defect measures about 7 by 5 mm on image 32/8. However, this was somewhat transient. Because the patient took small swallows and  possibly due to intrinsic disease, the distal esophagus and gastroesophageal junction remain narrow. I was only able  to distend these regions up to about 0.5 cm. Because the patient refused the barium pill I was unable to challenge the region with a larger non liquid bolus. The primary peristaltic waves in the esophagus were not disrupted. IMPRESSION: 1. Extensive esophagitis with small ulcerations and plaque-like lesions extending from the lower cervical esophagus to the lower thoracic esophagus. Top differential diagnostic considerations for this distribution and appearance include Candida esophagitis, cytomegalovirus esophagitis, herpes esophagitis, or glycogenic acanthosis. 2. Considerable narrowing of the distal esophagus. On series 8 there was a transient filling defect in this vicinity which may been incidental but could be from a varix or possibly even a mass. Because of the relatively small boluses the patient was able to swallow, I am not confident that I was able to fully fluid distend the distal esophagus, the patient was unable to swallow a barium pill. The possibility of a distal esophageal mass is not totally excluded, and after treatment for the patient's presumed esophagitis, consider either endoscopy or repeat esophagram to reassess the distal esophagus. Electronically Signed   By: Van Clines M.D.   On: 10/03/2018 10:13   Review of Systems  Constitutional: Positive for malaise/fatigue and weight loss. Negative for chills and fever.  HENT: Negative.   Eyes: Negative.   Respiratory: Negative.   Gastrointestinal: Positive for heartburn and nausea. Negative for abdominal pain, blood in stool, constipation, diarrhea, melena and vomiting.  Genitourinary: Negative.   Musculoskeletal: Positive for joint pain.  Skin: Negative.   Neurological: Positive for sensory change and weakness. Negative for dizziness, tingling, tremors, speech change, seizures, loss of consciousness and headaches.   Endo/Heme/Allergies: Negative.   Psychiatric/Behavioral: Negative.    Blood pressure 134/64, pulse 66, temperature 98.8 F (37.1 C), temperature source Oral, resp. rate 14, height 5\' 6"  (1.676 m), weight 70.8 kg, SpO2 100 %. Physical Exam  Constitutional: She is oriented to person, place, and time. She appears well-nourished.  HENT:  Head: Normocephalic and atraumatic.  Eyes: Pupils are equal, round, and reactive to light. Conjunctivae and EOM are normal.  Neck: Normal range of motion. Neck supple.  Cardiovascular: Normal rate and regular rhythm.  Respiratory: Effort normal and breath sounds normal.  Pot-a-cath noted on right chest  GI: Soft. Bowel sounds are normal.  An ostomy bag is noted on the abdomen with some brown liquid stool in the bag    Musculoskeletal: Normal range of motion.  Neurological: She is alert and oriented to person, place, and time.  Skin: Skin is warm and dry.  Psychiatric: She has a normal mood and affect. Her behavior is normal. Judgment and thought content normal.   Assessment/Plan: 1) Dysphagia/odynophagia with abnormal barium swallow-an EGD EGD is planned for the patient tomorrow.  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  3) Acute kidney injury that seems to be improving. 4) Hypokalemia/hyponatremia/hyperphosphatemia/hypermagnesia. 5) HTN. 6) AODM. 7) History of CVA. 8) Normocytic anemia. Juanita Craver 10/03/2018, 4:40 PM

## 2018-10-03 NOTE — TOC Initial Note (Signed)
Transition of Care [facility]) - Initial/Assessment Note    Patient Details  Name: ARISBETH PURRINGTON MRN: [id number] Date of Birth: Aug 20, 1950  Transition of Care [facility]) CM/SW Contact:    Nila Nephew, LCSW Phone Number: [phone] 10/03/2018, 11:13 AM  Clinical Narrative:        Pt admitted with dehydration/AKI from home where she resides alone. Known to CSW from previous encounter. 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 Has had [facility] after last hospital encounter. Discussed with pt today current recommendation for SNF. She is undecided about wanting SNF- however, reports desire to know "where she could go and if they've had covid there." CSW completed FL2 and made referrals, will follow up with pt.             Expected Discharge Plan: SNF versus home with home health Barriers to Discharge: Continued Medical Work up, SNF pending bed offers, pt decision, insurance authorization   Patient Goals and CMS Choice Patient states their goals for this hospitalization and ongoing recovery are:: to go home- to have some help      Expected Discharge Plan and Services Expected Discharge Plan: SNF versus Home/Home Health   Discharge Planning Services: CM Consult   Living arrangements for the past 2 months: Single Family Home                 DME Arranged: N/A DME Agency: NA       HH Arranged: NA [place] Agency: NA        Prior Living Arrangements/Services Living arrangements for the past 2 months: Single Family Home Lives with:: Self Patient language and need for interpreter reviewed:: Yes Do you feel safe going back to the place where you live?: Yes      Need for Family Participation in Patient Care: Yes (Comment) Care giver support system in place?: Yes (comment)   Criminal Activity/Legal Involvement Pertinent to Current Situation/Hospitalization: No - Comment as needed  Activities of Daily Living Home Assistive  Devices/Equipment: Cane (specify quad or straight), Blood pressure cuff, Shower chair without back ADL Screening (condition at time of admission) Patient's cognitive ability adequate to safely complete daily activities?: Yes Is the patient deaf or have difficulty hearing?: No Does the patient have difficulty seeing, even when wearing glasses/contacts?: No Does the patient have difficulty concentrating, remembering, or making decisions?: No Patient able to express need for assistance with ADLs?: Yes Does the patient have difficulty dressing or bathing?: No Independently performs ADLs?: Yes (appropriate for developmental age) Does the patient have difficulty walking or climbing stairs?: Yes Weakness of Legs: Both Weakness of Arms/Hands: Both  Permission Sought/Granted                  Emotional Assessment Appearance:: Appears stated age Attitude/Demeanor/Rapport: Engaged Affect (typically observed): Accepting Orientation: : Oriented to Place, Oriented to  Time, Oriented to Situation, Oriented to Self   Psych Involvement: No (comment)  Admission diagnosis:  Dehydration [E86.0] Hypokalemia [E87.6] AKI (acute kidney injury) ([place]) [N17.9] Patient Active Problem List   Diagnosis Date Noted  . AKI (acute kidney injury) ([place]) 10/01/2018  . Hypokalemia 10/01/2018  . Pyuria 10/01/2018  . Odynophagia 10/01/2018  . Hyponatremia 10/01/2018  . Right sided weakness 10/01/2018  . SBO (small bowel obstruction) ([place]) 08/22/2018  . Dehydration 08/21/2018  . Pre-operative cardiovascular examination 06/07/2018  . Rectal cancer ([place]) 11/06/2017  . Claudication in peripheral vascular disease ([place]) 05/08/2017  . Carotid artery  disease ([place]) 04/14/2017  . PAD (peripheral artery disease) Rt ABI 0.7, Lt ABI 0.46 12/25/2012  . S/P arterial stent, 12/25/13, successful diamondback orbital rotational arthrectomy, PTA using chocolate  balloon and stenting using I DEV stent of long segment calcified  high-grade proximal and mid r 12/25/2012  . Gangrene of toe, Rt second toe 12/25/2012  . Essential hypertension 12/13/2012  . Non-insulin treated type 2 diabetes mellitus ([place]) 12/13/2012  . Critical lower limb ischemia 12/13/2012  . Tobacco abuse 12/13/2012   PCP:  Reynold Bowen, MD Pharmacy:   [id number] [place], [place] [address] [place] [address] Phone: [phone] Fax: [phone]  [place], [place] [facility] [address] [place] [address] [place] [address] Phone: [phone] Fax: [place], [place] [address] Phone: [phone] Fax: [phone]     Social Determinants of Health (SDOH) Interventions    Readmission Risk Interventions Readmission Risk Prevention Plan 10/02/2018 08/24/2018  Transportation Screening Complete Complete  Medication Review Press photographer) Complete Complete  PCP or Specialist appointment within 3-5 days of discharge Not Complete Not Complete  PCP/Specialist Appt Not Complete comments not ready to dc Not ready for dc  HRI or Home Care Consult Complete Complete  SW Recovery Care/Counseling Consult Complete Not Complete  SW Consult Not Complete Comments - NA  Palliative Care Screening Not Applicable Not [place] Not Applicable Not Applicable  Some recent data might be hidden

## 2018-10-03 NOTE — Progress Notes (Signed)
PROGRESS NOTE    Nicole Sellers  RSW:[id number] DOB: 06/10/1950 DOA: 10/01/2018 PCP: Reynold Bowen, MD   Brief Narrative:  Nicole Sellers is a 68 y.o. femalewith medical history significant of Colon CA,anemia, clotting disorder, lower limb ischemia stenting, GERD, HLD, HTN, hypothyroidism,Dm2, tobacco abuse. Patient presented secondary to decreased oral intake/dysphagia and weakness  **Interim History Patient is AKI improving and SLP evaluated and did a barium swallow which showed extensive esophagitis and severe ulcerations and distal esophageal narrowing.  GI is consulted for further evaluation and she remains on a clear liquid diet for now.  She is started on IV PPI twice daily and also started on fluconazole for pharmacy to dose for suspected candidal esophagitis.  We will have GI evaluate for further endoscopic evaluation for narrowed esophagus and esophagitis.  IV fluids have been resumed.  Assessment & Plan:   Active Problems:   Essential hypertension   Non-insulin treated type 2 diabetes mellitus (HCC)   PAD (peripheral artery disease) Rt ABI 0.7, Lt ABI 0.46   Rectal cancer (HCC)   Dehydration   AKI (acute kidney injury) (HCC)   Hypokalemia   Pyuria   Odynophagia   Hyponatremia   Right sided weakness  Dehydration, improving Secondary to dysphagia issues -See below -Continue IV fluid hydration as below.   AKI, improving  -Secondary to poor oral intake and resultant dehydration as mentioned above. Baseline creatinine of about 1. Creatinine on admission of 4.21 with an associated BUN of 156. Trended down since admission and now BUN/Cr is 112/1.77. -Continue IV fluids with NS + 40 mEQ at 75 mL/hr x1 day  -Strict I's and O's, Daily Weights  -Avoid nephrotoxic medications, contrast dyes as well as hypotension possible -Repeat CMP in a.m.  Odynophagia in the setting of esophagitis and distal esophageal narrowing suspected from a possible mass similar  etiology -Speech therapy consulted. Discussed results with SLP who recommends barium swallow -Barium Swallow Results showed extensive esophagitis with small ulcerations of plaque-like lesions extending from the lower cervical esophagus to the lower thoracic esophagus; differentials included Candida esophagitis, cytomegalovirus esophagitis, herpes esophagitis, or glycogenic acanthosis there is also considerable narrowing of the distal esophagus the possibility of the distal esophageal mass is not fully excluded -Gastroenterology is consulted for further evaluation recommendation patient was placed on IV PPI twice daily and also started on fluconazole per pharmacy to dose for suspected Candida esophagitis -We will have gastroenterology Dr. Collene Mares evaluate for possible EGD and further evaluation to reassess the distal esophagus along with evaluating esophagitis -CMV antibody IgG and IgM  Hypokalemia -Significantly low and was 2.9 this AM -Replete with supplementation IV KCl 40 mEQ and with NS + 40 mEQ KCl at 75 mL/hr x 1 day -Mag Level was Normal -Continue to Monitor and Replete as Necessary -Repeat CMP in AM   Hyponatremia -Mild. In setting of poor oral intake and now improved -IV fluids as below  -Continue to Monitor and Trend and Repeat CMP in AM  Pyuria -Urinalysis showed hazy appearance, moderate hemoglobin, moderate leukocytes, rare bacteria, 6-10 RBCs per high-power field, and 21-50 WBCs -Unclear if patient was having symptoms but will need to evaluate this further. -Ceftriaxone was started empirically. Urine cultures obtained -Follow urine culture and showed no growth -We will discontinue antibiotics after today given lack of symptoms and fever; WBC is normal  Essential Hypertension -Slightly uncontrolled. On amlodipine, Maxide -Continue amlodipine  Hyperphosphatemia Hypermagnesemia -In setting of AKI. Improved -Continue to Monitor and repeat Mag and Phos Level  in AM    Hypothyroidism -TSH was 0.968 -Continue Levothyroxine 50 mcg po Daily   PAD -Patient previously on Aspirin and Plavix. Per chart review, did not tolerate plavix. -Continue aspirin 81 mg po Daily   Rectal cancer -Follows with Dr. Burr Medico. -Medical Oncology Consulted for further evaluation and recc's and patient is currently on observation from a rectal cancer standpoint and they are planning for outpatient follow-up for routine monitoring  Diabetes Mellitus Type 2 -Patient is on metformin and glimepiride as an outpatient.  -HbA1c this Visit was 6.7 -Continue SSI Sensitive Scale  -Patient may benefit from medication adjustment on discharge (discontinuing glimepiride) secondary to age -CBG's ranging from 47-254 -Continue to Adjust Insulin as Necessary   Right sided weakness History of CVA -CT head unremarkable -C/w Aspirin 81 mg po Daily and Atorvastatin 10 mg po Daily   Normocytic Anemia -Patient's Hgb/Hct went from 11.6/34.3 -> 10.4/31.2 -> 9.9/30.2 -C/w Iron Polysaccharides 150 mg po BID -Check Anemia Panel in AM  -Continue to Monitor for S/Sx of Bleeding; Currently no overt bleeding noted -Repeat CBC in AM   DVT prophylaxis: SCDs Code Status: FULL CODE Family Communication: No family present at bedside  Disposition Plan: SNF if agreeable and once evaluated by GI and cleared   Consultants:   Medical Oncology  Nutrition  Gastroenterology    Procedures: DG Esophagus   Antimicrobials:  Anti-infectives (From admission, onward)   Start     Dose/Rate Route Frequency Ordered Stop   10/03/18 1100  fluconazole (DIFLUCAN) tablet 100 mg     100 mg Oral Daily 10/03/18 1007 10/10/18 0959   10/02/18 2100  cefTRIAXone (ROCEPHIN) 1 g in sodium chloride 0.9 % 100 mL IVPB     1 g 200 mL/hr over 30 Minutes Intravenous Every 24 hours 10/02/18 0022     10/01/18 2145  cefTRIAXone (ROCEPHIN) 1 g in sodium chloride 0.9 % 100 mL IVPB     1 g 200 mL/hr over 30 Minutes  Intravenous  Once 10/01/18 2136 10/01/18 2227     Subjective: Seen and examined at bedside and states that her throat still hurting.  No chest pain, lightheadedness or dizziness but said that she was able to swallow a little bit more.  No other concerns or complaints at this time and I discussed with her about getting gastroenterology involved she is agreeable.  Objective: Vitals:   10/03/18 0418 10/03/18 0758 10/03/18 1213 10/03/18 1622  BP: 134/61 125/60 (!) 144/61 134/64  Pulse: 70 68 74 66  Resp: 20 12 16 14   Temp: 98.6 F (37 C) 98.7 F (37.1 C) 98.7 F (37.1 C) 98.8 F (37.1 C)  TempSrc: Oral Oral Oral Oral  SpO2: 100% 97% 100% 100%  Weight:      Height:        Intake/Output Summary (Last 24 hours) at 10/03/2018 1642 Last data filed at 10/03/2018 1520 Gross per 24 hour  Intake 500 ml  Output 525 ml  Net -25 ml   Filed Weights   10/01/18 1324  Weight: 70.8 kg   Examination: Physical Exam:  Constitutional: WN/WD overweight AAF in NAD and appears slightly uncomfortable Eyes: Lids and conjunctivae normal, sclerae anicteric  ENMT: External Ears, Nose appear normal. Grossly normal hearing.  Neck: Appears normal, supple, no cervical masses, normal ROM, no appreciable thyromegaly; no JVD Respiratory: Diminished to auscultation bilaterally, no wheezing, rales, rhonchi or crackles. Normal respiratory effort and patient is not tachypenic. No accessory muscle use.  Cardiovascular: RRR, no murmurs /  rubs / gallops. S1 and S2 auscultated.  Abdomen: Soft, non-tender, Mildly distended. No masses palpated. No appreciable hepatosplenomegaly. Bowel sounds positive x4.  GU: Deferred. Musculoskeletal: No clubbing / cyanosis of digits/nails. No joint deformity upper and lower extremities.  Skin: No rashes, lesions, ulcers on a limited skin evaluation. No induration; Warm and dry.  Neurologic: CN 2-12 grossly intact with no focal deficits.  Romberg sign and cerebellar reflexes not  assessed.  Psychiatric: Normal judgment and insight. Awake and Alert. Anxious mood and appropriate affect.   Data Reviewed: I have personally reviewed following labs and imaging studies  CBC: Recent Labs  Lab 10/01/18 2034 10/02/18 0409 10/03/18 0530  WBC 5.2 5.0 4.2  NEUTROABS 4.4  --   --   HGB 11.6* 10.4* 9.9*  HCT 34.3* 31.2* 30.2*  MCV 86.0 86.4 89.3  PLT 348 344 379   Basic Metabolic Panel: Recent Labs  Lab 10/01/18 2034 10/01/18 2330 10/02/18 0409 10/02/18 1418 10/03/18 0530  NA 130*  --  132*  --  137  K 2.4*  --  2.3* 2.7* 2.9*  CL 87*  --  95*  --  107  CO2 22  --  23  --  23  GLUCOSE 316*  --  213*  --  128*  BUN 156*  --  140*  --  112*  CREATININE 4.21*  --  3.16*  --  1.77*  CALCIUM 10.3  --  9.7  --  9.5  MG  --  2.6* 2.6*  --  2.3  PHOS  --  7.4* 4.9*  --  2.5   GFR: Estimated Creatinine Clearance: 27.7 mL/min (A) (by C-G formula based on SCr of 1.77 mg/dL (H)). Liver Function Tests: Recent Labs  Lab 10/01/18 2034 10/02/18 0409 10/03/18 0530  AST 22 23 31   ALT 15 14 16   ALKPHOS 91 77 77  BILITOT 0.8 0.4 0.4  PROT 8.2* 7.1 6.5  ALBUMIN 4.4 3.8 3.3*   No results for input(s): LIPASE, AMYLASE in the last 168 hours. No results for input(s): AMMONIA in the last 168 hours. Coagulation Profile: No results for input(s): INR, PROTIME in the last 168 hours. Cardiac Enzymes: Recent Labs  Lab 10/01/18 2330  CKTOTAL 132   BNP (last 3 results) No results for input(s): PROBNP in the last 8760 hours. HbA1C: Recent Labs    10/02/18 0409  HGBA1C 6.7*   CBG: Recent Labs  Lab 10/02/18 2349 10/03/18 0420 10/03/18 0755 10/03/18 1214 10/03/18 1620  GLUCAP 254* 76 176* 181* 169*   Lipid Profile: No results for input(s): CHOL, HDL, LDLCALC, TRIG, CHOLHDL, LDLDIRECT in the last 72 hours. Thyroid Function Tests: Recent Labs    10/02/18 0409  TSH 0.968   Anemia Panel: No results for input(s): VITAMINB12, FOLATE, FERRITIN, TIBC, IRON,  RETICCTPCT in the last 72 hours. Sepsis Labs: No results for input(s): PROCALCITON, LATICACIDVEN in the last 168 hours.  Recent Results (from the past 240 hour(s))  Urine culture     Status: None   Collection Time: 10/01/18  7:37 PM   Specimen: Urine, Clean Catch  Result Value Ref Range Status   Specimen Description   Final    URINE, CLEAN CATCH Performed at [facility], [place] [address], [place], [place] [address]    Special Requests   Final    NONE Performed at [facility], [place] [address], [place], [place] [address]    Culture   Final    NO GROWTH  Performed at [facility], [place] [address] [place], [place] [address]    Report Status 10/02/2018 FINAL  Final    Radiology Studies: Ct Head Wo Contrast  Result Date: 10/01/2018 CLINICAL DATA:  Failure to thrive EXAM: CT HEAD WITHOUT CONTRAST TECHNIQUE: Contiguous axial images were obtained from the base of the skull through the vertex without intravenous contrast. COMPARISON:  11/05/2013 FINDINGS: Brain: Old right basal ganglia lacunar infarct. There is atrophy and chronic small vessel disease changes. No acute intracranial abnormality. Specifically, no hemorrhage, hydrocephalus, mass lesion, acute infarction, or significant intracranial injury. Vascular: No hyperdense vessel or unexpected calcification. Skull: No acute calvarial abnormality. Sinuses/Orbits: Visualized paranasal sinuses and mastoids clear. Orbital soft tissues unremarkable. Other: None IMPRESSION: Old right basal ganglia lacunar infarct. Atrophy, chronic microvascular disease. No acute intracranial abnormality. Electronically Signed   By: Rolm Baptise M.D.   On: 10/01/2018 23:24   US Renal  Result Date: 10/02/2018 CLINICAL DATA:  Acute renal injury. EXAM: RENAL / URINARY TRACT ULTRASOUND COMPLETE COMPARISON:  Abdomen series 10/01/2018.  CT 01/17/2018. FINDINGS: Right Kidney: Renal measurements: 8.2 x 4.3 x 4.5 cm = volume: 83.6 mL  . Echogenicity within normal limits. No mass or hydronephrosis visualized. Left Kidney: Renal measurements: 9.8 x 3.8 x 4.1 cm = volume: 79.1 mL. Echogenicity within normal limits. No mass or hydronephrosis visualized. Bladder: Appears normal for degree of bladder distention. IMPRESSION: No acute or focal abnormality identified. Electronically Signed   By: Marcello Moores  Register   On: 10/02/2018 09:45   Dg Abd Acute 2+v W 1v Chest  Result Date: 10/01/2018 CLINICAL DATA:  Cough, abdominal distention EXAM: DG ABDOMEN ACUTE W/ 1V CHEST COMPARISON:  11/22/2017 chest x-ray.  Abdominal film 08/28/2018 FINDINGS: Right Port-A-Cath in place with the tip in the SVC, unchanged. Lungs clear. Heart is normal size. No effusions. Nonobstructive bowel gas pattern. No organomegaly, free air or suspicious calcification. Vascular calcifications noted. No acute bony abnormality. IMPRESSION: No acute cardiopulmonary disease. No evidence of bowel obstruction or free air. Electronically Signed   By: Rolm Baptise M.D.   On: 10/01/2018 21:09   Dg Esophagus W Double Cm (hd)  Result Date: 10/03/2018 CLINICAL DATA:  Odynophagia. History of colon cancer with lower anterior resection. EXAM: ESOPHOGRAM/BARIUM SWALLOW TECHNIQUE: Combined double contrast and single contrast examination performed using effervescent crystals, thick barium liquid, and thin barium liquid. FLUOROSCOPY TIME:  Fluoroscopy Time:  2 minutes, 12 seconds Radiation Exposure Index (if provided by the fluoroscopic device): 9.7 mGy Number of Acquired Spot Images: 0 COMPARISON:  CT chest from 10/25/2017 FINDINGS: The patient is somewhat infirm and slow to move. I judged that with minor assistance she would be able to stand for example for the pharyngeal phase images and for double contrast images, but she did not have ideal mobility for the exam, and also refused to swallow the barium pill. We performed LPO rather than RAO images as I was skeptical she would be able to  comfortably lay on her belly. On the pharyngeal phase of swallowing the patient retained barium in the mouth and only swallowed boluses of the oral barium at a time. There is an episode of laryngeal penetration, and also some mild vallecular stasis Starting in the lowest portion of the cervical esophagus but primarily involving the thoracic esophagus, we demonstrate shaggy nodular irregularity of the esophageal wall for example as shown on image 3/4. This has an appearance suggesting small marginal ulcerations and plaque-like lesions. There some sparing of the distal 25% of  the thoracic esophagus. Nodularity in the distal esophagus is observed for example on image 33/8, which could be from polyp, ulceration, or a small mass. The filling defect measures about 7 by 5 mm on image 32/8. However, this was somewhat transient. Because the patient took small swallows and possibly due to intrinsic disease, the distal esophagus and gastroesophageal junction remain narrow. I was only able to distend these regions up to about 0.5 cm. Because the patient refused the barium pill I was unable to challenge the region with a larger non liquid bolus. The primary peristaltic waves in the esophagus were not disrupted. IMPRESSION: 1. Extensive esophagitis with small ulcerations and plaque-like lesions extending from the lower cervical esophagus to the lower thoracic esophagus. Top differential diagnostic considerations for this distribution and appearance include Candida esophagitis, cytomegalovirus esophagitis, herpes esophagitis, or glycogenic acanthosis. 2. Considerable narrowing of the distal esophagus. On series 8 there was a transient filling defect in this vicinity which may been incidental but could be from a varix or possibly even a mass. Because of the relatively small boluses the patient was able to swallow, I am not confident that I was able to fully fluid distend the distal esophagus, the patient was unable to swallow a  barium pill. The possibility of a distal esophageal mass is not totally excluded, and after treatment for the patient's presumed esophagitis, consider either endoscopy or repeat esophagram to reassess the distal esophagus. Electronically Signed   By: Van Clines M.D.   On: 10/03/2018 10:13   Scheduled Meds: . amLODipine  5 mg Oral Daily  . aspirin EC  81 mg Oral Daily  . atorvastatin  10 mg Oral Daily  . Chlorhexidine Gluconate Cloth  6 each Topical Daily  . feeding supplement  1 Container Oral TID BM  . fluconazole  100 mg Oral Daily  . gabapentin  300 mg Oral Q12H  . insulin aspart  0-9 Units Subcutaneous Q4H  . iron polysaccharides  150 mg Oral BID  . levothyroxine  50 mcg Oral Q0600  . loratadine  10 mg Oral Daily  . mirtazapine  15 mg Oral QHS  . multivitamin with minerals  1 tablet Oral Daily  . pantoprazole (PROTONIX) IV  40 mg Intravenous Q12H  . thiamine injection  100 mg Intravenous Daily   Continuous Infusions: . 0.9 % NaCl with KCl 40 mEq / L    . cefTRIAXone (ROCEPHIN)  IV 1 g (10/02/18 2113)  . potassium chloride      LOS: 2 days   Kerney Elbe, DO [facility] PAGER is on [place]  If 7PM-7AM, please contact night-coverage [email or web address] Password [facility] 10/03/2018, 4:42 PM

## 2018-10-03 NOTE — Progress Notes (Signed)
Pharmacy Antibiotic Note  Nicole Sellers is a 68 y.o. female admitted on 10/01/2018 with esophageal candidiasis.  Pharmacy has been consulted for fluconazole dosing. She was given 100 mg PO fluconazole today at 1233 ordered by Onc. SCr 4.21>3.16>1.77.    Plan: Give extra 100 mg dose today to make 200 mg loading dose then continue 100 mg qday x 7 days as already ordered by Oncology. Pharmacy to sign off  Height: 5\' 6"  (167.6 cm) Weight: 156 lb (70.8 kg) IBW/kg (Calculated) : 59.3  Temp (24hrs), Avg:98.6 F (37 C), Min:98 F (36.7 C), Max:98.8 F (37.1 C)  Recent Labs  Lab 10/01/18 2034 10/02/18 0409 10/03/18 0530  WBC 5.2 5.0 4.2  CREATININE 4.21* 3.16* 1.77*    Estimated Creatinine Clearance: 27.7 mL/min (A) (by C-G formula based on SCr of 1.77 mg/dL (H)).    Allergies  Allergen Reactions  . Kiwi Extract Itching    Throat itching   . Lisinopril     Bruising     Thank you for allowing pharmacy to be a part of this patient's care.  Eudelia Bunch, Pharm.D [phone] 10/03/2018 5:14 PM

## 2018-10-03 NOTE — NC FL2 (Signed)
[place] MEDICAID FL2 LEVEL OF CARE SCREENING TOOL     IDENTIFICATION  Patient Name: KATASHA RIGA Birthdate: 08/22/1950 Sex: female Admission Date (Current Location): 10/01/2018  [facility] and [place] Number:  Herbalist and Address:  [facility],  [address], [place]      Provider Number: [id number]  Attending Physician Name and Address:  Kerney Elbe, DO  Relative Name and Phone Number:       Current Level of Care: Hospital Recommended Level of Care: [place] Prior Approval Number:    Date Approved/Denied:   PASRR Number: [id number]  Discharge Plan: SNF    Current Diagnoses: Patient Active Problem List   Diagnosis Date Noted  . AKI (acute kidney injury) ([place]) 10/01/2018  . Hypokalemia 10/01/2018  . Pyuria 10/01/2018  . Odynophagia 10/01/2018  . Hyponatremia 10/01/2018  . Right sided weakness 10/01/2018  . SBO (small bowel obstruction) ([place]) 08/22/2018  . Dehydration 08/21/2018  . Pre-operative cardiovascular examination 06/07/2018  . Rectal cancer ([place]) 11/06/2017  . Claudication in peripheral vascular disease ([place]) 05/08/2017  . Carotid artery disease ([place]) 04/14/2017  . PAD (peripheral artery disease) Rt ABI 0.7, Lt ABI 0.46 12/25/2012  . S/P arterial stent, 12/25/13, successful diamondback orbital rotational arthrectomy, PTA using chocolate  balloon and stenting using I DEV stent of long segment calcified high-grade proximal and mid r 12/25/2012  . Gangrene of toe, Rt second toe 12/25/2012  . Essential hypertension 12/13/2012  . Non-insulin treated type 2 diabetes mellitus ([place]) 12/13/2012  . Critical lower limb ischemia 12/13/2012  . Tobacco abuse 12/13/2012    Orientation RESPIRATION BLADDER Height & Weight     Self, Time, Situation, Place  Normal Incontinent Weight: 156 lb (70.8 kg) Height:  5\' 6"  (167.6 cm)  BEHAVIORAL SYMPTOMS/MOOD NEUROLOGICAL BOWEL NUTRITION STATUS   Ileostomy(Stoma type/location: 1 1/2" budded, pink)  Ostomy pouching: 1pc flat, patient independent with care  Diet(Thin liquids- see DC summary for updated diet)  AMBULATORY STATUS COMMUNICATION OF NEEDS Skin   Extensive Assist Verbally Normal                       Personal Care Assistance Level of Assistance  Bathing, Feeding, Dressing Bathing Assistance: Maximum assistance Feeding assistance: Independent Dressing Assistance: Maximum assistance     Functional Limitations Info  Sight, Hearing, Speech Sight Info: Adequate Hearing Info: Adequate Speech Info: Adequate    SPECIAL CARE FACTORS FREQUENCY  PT (By licensed PT), OT (By licensed OT), Speech therapy     PT Frequency: 5x OT Frequency: 5x     Speech Therapy Frequency: 1x      Contractures Contractures Info: Not present    Additional Factors Info  Code Status, Allergies Code Status Info: full code Allergies Info: Kiwi Extract, Lisinopril           Current Medications (10/03/2018):  This is the current hospital active medication list Current Facility-Administered Medications  Medication Dose Route Frequency Provider Last Rate Last Dose  . acetaminophen (TYLENOL) solution 650 mg  650 mg Oral Q6H PRN Schorr, Rhetta Mura, NP      . amLODipine (NORVASC) tablet 5 mg  5 mg Oral Daily Doutova, Anastassia, MD   5 mg at 10/02/18 0958  . aspirin EC tablet 81 mg  81 mg Oral Daily Doutova, Anastassia, MD   81 mg at 10/02/18 0958  . atorvastatin (LIPITOR) tablet 10 mg  10 mg Oral Daily Toy Baker, MD  10 mg at 10/02/18 0958  . cefTRIAXone (ROCEPHIN) 1 g in sodium chloride 0.9 % 100 mL IVPB  1 g Intravenous Q24H Doutova, Anastassia, MD 200 mL/hr at 10/02/18 2113 1 g at 10/02/18 2113  . Chlorhexidine Gluconate Cloth 2 % PADS 6 each  6 each Topical Daily Toy Baker, MD   6 each at 10/03/18 0948  . feeding supplement (BOOST / RESOURCE BREEZE) liquid 1 Container  1 Container Oral TID BM Toy Baker,  MD   1 Container at 10/03/18 0947  . fluconazole (DIFLUCAN) tablet 100 mg  100 mg Oral Daily Curcio, Kristin R, NP      . gabapentin (NEURONTIN) 250 MG/5ML solution 300 mg  300 mg Oral Q12H Schorr, Rhetta Mura, NP   300 mg at 10/03/18 0944  . HYDROcodone-acetaminophen (HYCET) 7.5-325 mg/15 ml solution 15 mL  15 mL Oral Q4H PRN Schorr, Rhetta Mura, NP      . insulin aspart (novoLOG) injection 0-9 Units  0-9 Units Subcutaneous Q4H Toy Baker, MD   2 Units at 10/03/18 0814  . iron polysaccharides (NIFEREX) capsule 150 mg  150 mg Oral BID Toy Baker, MD   150 mg at 10/02/18 2119  . levothyroxine (SYNTHROID) tablet 50 mcg  50 mcg Oral Q0600 Toy Baker, MD   50 mcg at 10/03/18 0624  . loratadine (CLARITIN) tablet 10 mg  10 mg Oral Daily Doutova, Anastassia, MD   10 mg at 10/02/18 0958  . magic mouthwash  5 mL Oral TID PRN Toy Baker, MD      . mirtazapine (REMERON) tablet 15 mg  15 mg Oral QHS Doutova, Anastassia, MD   15 mg at 10/02/18 2119  . multivitamin with minerals tablet 1 tablet  1 tablet Oral Daily Toy Baker, MD   1 tablet at 10/02/18 0958  . ondansetron (ZOFRAN) tablet 4 mg  4 mg Oral Q6H PRN Toy Baker, MD       Or  . ondansetron (ZOFRAN) injection 4 mg  4 mg Intravenous Q6H PRN Doutova, Anastassia, MD      . potassium chloride 10 mEq in 100 mL IVPB  10 mEq Intravenous Q1 Hr x 4 Sheikh, Georgina Quint [place], DO 100 mL/hr at 10/03/18 0945 10 mEq at 10/03/18 0945  . sodium chloride flush (NS) 0.9 % injection 10-40 mL  10-40 mL Intracatheter PRN Doutova, Anastassia, MD      . thiamine (B-1) injection 100 mg  100 mg Intravenous Daily Doutova, Anastassia, MD   100 mg at 10/03/18 9417   Facility-Administered Medications Ordered in Other Encounters  Medication Dose Route Frequency Provider Last Rate Last Dose  . 0.9 %  sodium chloride infusion   Intravenous Continuous Leighton Ruff, MD      . acetaminophen (TYLENOL) tablet 650 mg  650 mg Oral E0C PRN  Leighton Ruff, MD       Or  . acetaminophen (TYLENOL) suppository 650 mg  650 mg Rectal X4G PRN Leighton Ruff, MD      . diphenhydrAMINE (BENADRYL) 12.5 MG/5ML elixir 12.5 mg  12.5 mg Oral Y1E PRN Leighton Ruff, MD       Or  . diphenhydrAMINE (BENADRYL) injection 12.5 mg  12.5 mg Intravenous H6D PRN Leighton Ruff, MD      . enoxaparin (LOVENOX) injection 30 mg  30 mg Subcutaneous J49F Leighton Ruff, MD      . heparin lock flush 100 unit/mL  500 Units Intracatheter Once PRN Alla Feeling, NP      . ondansetron (  ZOFRAN-ODT) disintegrating tablet 4 mg  4 mg Oral O0B PRN Leighton Ruff, MD       Or  . ondansetron [facility]) 4 mg in sodium chloride 0.9 % 50 mL IVPB  4 mg Intravenous B0W PRN Leighton Ruff, MD      . polycarbophil (FIBERCON) tablet 625 mg  625 mg Oral TID Leighton Ruff, MD      . sodium chloride 0.9 % bolus 1,000 mL  1,000 mL Intravenous Once Leighton Ruff, MD      . sodium chloride flush (NS) 0.9 % injection 10 mL  10 mL Intracatheter Once PRN Alla Feeling, NP         Discharge Medications: Please see discharge summary for a list of discharge medications.  Relevant Imaging Results:  Relevant Lab Results:   Additional Information SS# [id number]  Nila Nephew, LCSW

## 2018-10-03 NOTE — Progress Notes (Signed)
Pt's CBG at 2353 was 254.  5 units Novolog given as ordered.  Pt's CBG at 0400 was 76.  Pt was given boost supplement at this time.  Pushed po fluids during night only taking in about

## 2018-10-04 ENCOUNTER — Encounter (HOSPITAL_COMMUNITY)
Admission: EM | Disposition: A | Discharge status: Home Health | Payer: Self-pay | Source: Home / Self Care | Attending: Internal Medicine

## 2018-10-04 ENCOUNTER — Encounter (HOSPITAL_COMMUNITY): Payer: Self-pay | Admitting: Certified Registered"

## 2018-10-04 DIAGNOSIS — B370 Candidal stomatitis: Secondary | ICD-10-CM

## 2018-10-04 HISTORY — PX: SAVORY DILATION: SHX5439

## 2018-10-04 HISTORY — PX: ESOPHAGOGASTRODUODENOSCOPY: SHX5428

## 2018-10-04 LAB — COMPREHENSIVE METABOLIC PANEL
ALT: 16 U/L (ref 0–44)
AST: 28 U/L (ref 15–41)
Albumin: 3.1 g/dL — ABNORMAL LOW (ref 3.5–5.0)
Alkaline Phosphatase: 76 U/L (ref 38–126)
Anion gap: 8 (ref 5–15)
BUN: 65 mg/dL — ABNORMAL HIGH (ref 8–23)
CO2: 21 mmol/L — ABNORMAL LOW (ref 22–32)
Calcium: 9.3 mg/dL (ref 8.9–10.3)
Chloride: 113 mmol/L — ABNORMAL HIGH (ref 98–111)
Creatinine, Ser: 1.48 mg/dL — ABNORMAL HIGH (ref 0.44–1.00)
GFR calc Af Amer: 41 mL/min — ABNORMAL LOW (ref >60)
GFR calc non Af Amer: 36 mL/min — ABNORMAL LOW (ref >60)
Glucose, Bld: 157 mg/dL — ABNORMAL HIGH (ref 70–99)
Potassium: 5.1 mmol/L (ref 3.5–5.1)
Sodium: 142 mmol/L (ref 135–145)
Total Bilirubin: 0.5 mg/dL (ref 0.3–1.2)
Total Protein: 6.4 g/dL — ABNORMAL LOW (ref 6.5–8.1)

## 2018-10-04 LAB — GLUCOSE, CAPILLARY
Glucose-Capillary: 109 mg/dL — ABNORMAL HIGH (ref 70–99)
Glucose-Capillary: 109 mg/dL — ABNORMAL HIGH (ref 70–99)
Glucose-Capillary: 156 mg/dL — ABNORMAL HIGH (ref 70–99)
Glucose-Capillary: 175 mg/dL — ABNORMAL HIGH (ref 70–99)
Glucose-Capillary: 204 mg/dL — ABNORMAL HIGH (ref 70–99)
Glucose-Capillary: 277 mg/dL — ABNORMAL HIGH (ref 70–99)
Glucose-Capillary: 90 mg/dL (ref 70–99)
Glucose-Capillary: 98 mg/dL (ref 70–99)

## 2018-10-04 LAB — CMV IGM: CMV IgM: <30 AU/mL (ref 0.0–29.9)

## 2018-10-04 LAB — RETICULOCYTES
Immature Retic Fract: 8 % (ref 2.3–15.9)
RBC.: 3.34 MIL/uL — ABNORMAL LOW (ref 3.87–5.11)
Retic Count, Absolute: 53.8 10*3/uL (ref 19.0–186.0)
Retic Ct Pct: 1.6 % (ref 0.4–3.1)

## 2018-10-04 LAB — CBC WITH DIFFERENTIAL/PLATELET
Abs Immature Granulocytes: 0.01 10*3/uL (ref 0.00–0.07)
Basophils Absolute: 0 10*3/uL (ref 0.0–0.1)
Basophils Relative: 1 %
Eosinophils Absolute: 0.2 10*3/uL (ref 0.0–0.5)
Eosinophils Relative: 4 %
HCT: 30.9 % — ABNORMAL LOW (ref 36.0–46.0)
Hemoglobin: 9.8 g/dL — ABNORMAL LOW (ref 12.0–15.0)
Immature Granulocytes: 0 %
Lymphocytes Relative: 15 %
Lymphs Abs: 0.6 10*3/uL — ABNORMAL LOW (ref 0.7–4.0)
MCH: 29.3 pg (ref 26.0–34.0)
MCHC: 31.7 g/dL (ref 30.0–36.0)
MCV: 92.5 fL (ref 80.0–100.0)
Monocytes Absolute: 0.3 10*3/uL (ref 0.1–1.0)
Monocytes Relative: 8 %
Neutro Abs: 3 10*3/uL (ref 1.7–7.7)
Neutrophils Relative %: 72 %
Platelets: 288 10*3/uL (ref 150–400)
RBC: 3.34 MIL/uL — ABNORMAL LOW (ref 3.87–5.11)
RDW: 15.1 % (ref 11.5–15.5)
WBC: 4.2 10*3/uL (ref 4.0–10.5)
nRBC: 0 % (ref 0.0–0.2)

## 2018-10-04 LAB — PHOSPHORUS: Phosphorus: 2.2 mg/dL — ABNORMAL LOW (ref 2.5–4.6)

## 2018-10-04 LAB — VITAMIN B12: Vitamin B-12: 574 pg/mL (ref 180–914)

## 2018-10-04 LAB — FERRITIN: Ferritin: 229 ng/mL (ref 11–307)

## 2018-10-04 LAB — IRON AND TIBC
Iron: 32 ug/dL (ref 28–170)
Saturation Ratios: 11 % (ref 10.4–31.8)
TIBC: 289 ug/dL (ref 250–450)
UIBC: 257 ug/dL

## 2018-10-04 LAB — FOLATE: Folate: 9.2 ng/mL (ref >5.9)

## 2018-10-04 LAB — MAGNESIUM: Magnesium: 2.1 mg/dL (ref 1.7–2.4)

## 2018-10-04 LAB — SARS CORONAVIRUS 2 BY RT PCR (HOSPITAL ORDER, PERFORMED IN ~~LOC~~ HOSPITAL LAB): SARS Coronavirus 2: NEGATIVE

## 2018-10-04 LAB — CMV ANTIBODY, IGG (EIA): CMV Ab - IgG: 9.1 U/mL — ABNORMAL HIGH (ref 0.00–0.59)

## 2018-10-04 SURGERY — EGD (ESOPHAGOGASTRODUODENOSCOPY)
Anesthesia: Moderate Sedation

## 2018-10-04 MED ORDER — DIPHENHYDRAMINE HCL 50 MG/ML IJ SOLN
INTRAMUSCULAR | Status: DC | PRN
  Administered 2018-10-04: 25 mg via INTRAVENOUS

## 2018-10-04 MED ORDER — K PHOS MONO-SOD PHOS DI & MONO 155-852-130 MG PO TABS
500.0000 mg | ORAL_TABLET | Freq: Once | ORAL | Status: AC
  Administered 2018-10-04: 500 mg via ORAL
  Filled 2018-10-04: qty 2

## 2018-10-04 MED ORDER — FENTANYL CITRATE (PF) 100 MCG/2ML IJ SOLN
INTRAMUSCULAR | Status: AC
  Filled 2018-10-04: qty 4

## 2018-10-04 MED ORDER — MIDAZOLAM HCL (PF) 10 MG/2ML IJ SOLN
INTRAMUSCULAR | Status: DC | PRN
  Administered 2018-10-04: 1 mg via INTRAVENOUS
  Administered 2018-10-04: 2 mg via INTRAVENOUS

## 2018-10-04 MED ORDER — DIPHENHYDRAMINE HCL 50 MG/ML IJ SOLN
INTRAMUSCULAR | Status: AC
  Filled 2018-10-04: qty 1

## 2018-10-04 MED ORDER — MIDAZOLAM HCL (PF) 5 MG/ML IJ SOLN
INTRAMUSCULAR | Status: AC
  Filled 2018-10-04: qty 2

## 2018-10-04 MED ORDER — SODIUM CHLORIDE 0.9 % IV SOLN
INTRAVENOUS | Status: AC
  Administered 2018-10-04: 09:00:00 via INTRAVENOUS

## 2018-10-04 MED ORDER — FENTANYL CITRATE (PF) 100 MCG/2ML IJ SOLN
INTRAMUSCULAR | Status: DC | PRN
  Administered 2018-10-04 (×2): 25 ug via INTRAVENOUS

## 2018-10-04 NOTE — Progress Notes (Signed)
PT Cancellation Note  Patient Details Name: Nicole Sellers MRN: [id number] DOB: Sep 26, 1950   Cancelled Treatment:    Reason Eval/Treat Not Completed: Patient at procedure or test/unavailable   Patient currently off unit for upper endoscopy. Will follow up at later time/date when patient is available.   Kipp Brood, PT, DPT, [facility] Physical Therapist with [facility]  10/04/2018 2:37 PM

## 2018-10-04 NOTE — Progress Notes (Signed)
Nutrition Follow-up  INTERVENTION:   -Once diet advanced, recommend Ensure Enlive po BID, each supplement provides 350 kcal and 20 grams of protein -Magic cup TID with meals, each supplement provides 290 kcal and 9 grams of protein  NUTRITION DIAGNOSIS:   Inadequate oral intake related to poor appetite as evidenced by per patient/family report.  Ongoing.  GOAL:   Patient will meet greater than or equal to 90% of their needs  Not meeting.  MONITOR:   PO intake, Supplement acceptance, Labs, Weight trends, I & O's  REASON FOR ASSESSMENT:   Consult Assessment of nutrition requirement/status  ASSESSMENT:   68 y.o. female with medical history significant of Colon CA, anemia, clotting disorder, lower limb ischemia stenting, GERD, HLD, HTN, hypothyroidism, Dm2, tobacco abuse. Admitted with   decreased appetite for the past 3 weeks since she has undergone her surgery for colon cancer has trouble swallowing her food  9/22 initial nutrition assessment  **RD working remotely**  Patient now NPO for EGD today to assess esophagus and dysphagia symptoms. Recommend Ensure supplements once diet is advanced.   No new weights recorded for admission since 9/21.  Medications: Niferex, Remeron tablet, Multivitamin with minerals daily, IV Thiamine  Labs reviewed: CBGs: 98-156 Low Phos Mg WNL  Diet Order:   Diet Order            Diet NPO time specified  Diet effective 1000              EDUCATION NEEDS:   Not appropriate for education at this time  Skin:  Skin Assessment: Reviewed RN Assessment  Last BM:  9/24  Height:   Ht Readings from Last 1 Encounters:  10/01/18 5\' 6"  (1.676 m)    Weight:   Wt Readings from Last 1 Encounters:  10/01/18 70.8 kg    Ideal Body Weight:  59.1 kg  BMI:  Body mass index is 25.18 kg/m.  Estimated Nutritional Needs:   Kcal:  1700-1900  Protein:  80-90g  Fluid:  1.7L/day  Clayton Bibles, MS, RD, LDN Inpatient Clinical  Dietitian Pager: [phone] After Hours Pager: [phone]

## 2018-10-04 NOTE — Interval H&P Note (Signed)
History and Physical Interval Note:  10/04/2018 3:34 PM  Perry  has presented today for surgery, with the diagnosis of Dysphagia.  The various methods of treatment have been discussed with the patient and family. After consideration of risks, benefits and other options for treatment, the patient has consented to  Procedure(s): ESOPHAGOGASTRODUODENOSCOPY (EGD) (N/A) as a surgical intervention.  The patient's history has been reviewed, patient examined, no change in status, stable for surgery.  I have reviewed the patient's chart and labs.  Questions were answered to the patient's satisfaction.     Zebulun Deman D

## 2018-10-04 NOTE — Op Note (Signed)
[facility] Patient Name: Nicole Sellers Procedure Date: 10/04/2018 MRN: [id number] Attending MD: Carol Ada , MD Date of Birth: 02/07/1950 CSN: [id number] Age: 68 Admit Type: Inpatient Procedure:                Upper GI endoscopy Indications:              Dysphagia Providers:                Carol Ada, MD, Glori Bickers, RN, Cletis [place],                            Technician Referring MD:              Medicines:                Fentanyl 50 micrograms IV, Midazolam 3 mg IV,                            Diphenhydramine 25 mg IV Complications:            No immediate complications. Estimated Blood Loss:     Estimated blood loss was minimal. Procedure:                Pre-Anesthesia Assessment:                           - Prior to the procedure, a History and Physical                            was performed, and patient medications and                            allergies were reviewed. The patient's tolerance of                            previous anesthesia was also reviewed. The risks                            and benefits of the procedure and the sedation                            options and risks were discussed with the patient.                            All questions were answered, and informed consent                            was obtained. Prior Anticoagulants: The patient has                            taken no previous anticoagulant or antiplatelet                            agents. ASA Grade Assessment: III - A patient with  severe systemic disease. After reviewing the risks                            and benefits, the patient was deemed in                            satisfactory condition to undergo the procedure.                           - Sedation was administered by an endoscopy nurse.                            The sedation level attained was moderate.                           After obtaining informed consent, the  endoscope was                            passed under direct vision. Throughout the                            procedure, the patient's blood pressure, pulse, and                            oxygen saturations were monitored continuously. The                            GIF-H190 ([id number]) Olympus gastroscope was                            introduced through the mouth, and advanced to the                            second part of duodenum. The upper GI endoscopy was                            technically difficult and complex. The patient                            tolerated the procedure well. Scope In: Scope Out: Findings:      One benign-appearing, intrinsic severe stenosis was found 20 cm from the       incisors. This stenosis measured 5 mm (inner diameter) x 1 cm (in       length). The stenosis was traversed after downsizing scope. A guidewire       was placed and the scope was withdrawn. Dilation was performed with a       Savary dilator with no resistance at 8 mm. The dilation site was       examined following endoscope reinsertion and showed complete resolution       of luminal narrowing. Estimated blood loss was minimal.      The stomach was normal.      The examined duodenum was normal.      A severe benign intrinsic stenosis was found at 20 cm. The adult  endoscope was not able to pass through. The ultraslim upper endoscope       was employed with with some maneuvering the endoscope passed through the       stenosis. The entire esophagus appeared to have evidence of scarring. It       is not clear if this was solely from a GERD esophagitis or another       etiology. The rest of the upper examination was normal. Over a guidewire       the esophagus was dilated using the following Savary dilators: 5 mm, 6       mm, 7 mm, and then the 8 mm. The appropriate mucosal disruption was       achieved and there was no evidence of crepitus during or after the        procedure. Impression:               - Benign-appearing esophageal stenosis. Dilated.                           - Normal stomach.                           - Normal examined duodenum.                           - No specimens collected. Moderate Sedation:      Moderate (conscious) sedation was administered by the endoscopy nurse       and supervised by the endoscopist. The patient's oxygen saturation,       heart rate, blood pressure and response to care were monitored. Recommendation:           - Return patient to hospital ward for ongoing care.                           - Clear liquid diet.                           - Continue present medications.                           - Repeat upper endoscopy in 2 weeks for retreatment. Procedure Code(s):        --- Professional ---                           [phone], Esophagogastroduodenoscopy, flexible,                            transoral; with insertion of guide wire followed by                            passage of dilator(s) through esophagus over guide                            wire Diagnosis Code(s):        --- Professional ---                           K22.2, Esophageal obstruction  R13.10, Dysphagia, unspecified CPT copyright 2019 [employer]. All rights reserved. The codes documented in this report are preliminary and upon coder review may  be revised to meet current compliance requirements. Carol Ada, MD Carol Ada, MD 10/04/2018 4:26:40 PM This report has been signed electronically. Number of Addenda: 0

## 2018-10-04 NOTE — Care Management Important Message (Signed)
Important Message  Patient Details IM Letter given to [name] SW to present to the Patient Name: Nicole Sellers MRN: [id number] Date of Birth: 29-May-1950   Medicare Important Message Given:  Yes     Kerin Salen 10/04/2018, 10:48 AM

## 2018-10-04 NOTE — Anesthesia Preprocedure Evaluation (Deleted)
Anesthesia Evaluation  Patient identified by MRN, date of birth, ID band Patient awake    Reviewed: Allergy & Precautions, H&P , NPO status , Patient's Chart, lab work & pertinent test results  Airway Mallampati: II   Neck ROM: full    Dental   Pulmonary former smoker,    breath sounds clear to auscultation       Cardiovascular hypertension, + Peripheral Vascular Disease   Rhythm:regular Rate:Normal     Neuro/Psych  Neuromuscular disease    GI/Hepatic GERD  ,dysphagia   Endo/Other  diabetes, Type 2Hypothyroidism   Renal/GU Renal InsufficiencyRenal disease     Musculoskeletal  (+) Arthritis ,   Abdominal   Peds  Hematology  (+) Blood dyscrasia, anemia ,   Anesthesia Other Findings   Reproductive/Obstetrics                             Anesthesia Physical Anesthesia Plan  ASA: III  Anesthesia Plan: MAC   Post-op Pain Management:    Induction: Intravenous  PONV Risk Score and Plan: 2 and Propofol infusion and Treatment may vary due to age or medical condition  Airway Management Planned: Nasal Cannula  Additional Equipment:   Intra-op Plan:   Post-operative Plan:   Informed Consent: I have reviewed the patients History and Physical, chart, labs and discussed the procedure including the risks, benefits and alternatives for the proposed anesthesia with the patient or authorized representative who has indicated his/her understanding and acceptance.       Plan Discussed with: CRNA, Anesthesiologist and Surgeon  Anesthesia Plan Comments:         Anesthesia Quick Evaluation

## 2018-10-04 NOTE — Progress Notes (Signed)
PROGRESS NOTE    Nicole Sellers  PZW:[id number] DOB: Feb 06, 1950 DOA: 10/01/2018 PCP: Reynold Bowen, MD   Brief Narrative:  Nicole Sellers is a 68 y.o. femalewith medical history significant of Colon CA,anemia, clotting disorder, lower limb ischemia stenting, GERD, HLD, HTN, hypothyroidism,Dm2, tobacco abuse. Patient presented secondary to decreased oral intake/dysphagia and weakness  **Interim History Patient is AKI improving and SLP evaluated and did a barium swallow which showed extensive esophagitis and severe ulcerations and distal esophageal narrowing.  GI is consulted for further evaluation and she remains on a clear liquid diet for now.  She is started on IV PPI twice daily and also started on fluconazole for pharmacy to dose for suspected candidal esophagitis.  We will have GI evaluate for further endoscopic evaluation for narrowed esophagus and esophagitis and they are planning for an upper endoscopy today.  IV fluids have been resumed and reduced to 75 mL.  Assessment & Plan:   Active Problems:   Essential hypertension   Non-insulin treated type 2 diabetes mellitus (HCC)   PAD (peripheral artery disease) Rt ABI 0.7, Lt ABI 0.46   Rectal cancer (HCC)   Dehydration   AKI (acute kidney injury) (HCC)   Hypokalemia   Pyuria   Odynophagia   Hyponatremia   Right sided weakness   Oral candidiasis  Dehydration, improving Secondary to dysphagia issues -See below -Continue IV fluid hydration as below now that is been reduced to 75 mL's per hour.   AKI, improving  -Secondary to poor oral intake and resultant dehydration as mentioned above. Baseline creatinine of about 1. Creatinine on admission of 4.21 with an associated BUN of 156. Trended down since admission and now BUN/Cr is 65/1.48. -Continued IV fluids with NS + 40 mEQ at 75 mL/hr x1 day but changed today to just normal saline at 75 mL's per hour and will resume for another 12 more hours and stop afterwards -Strict  I's and O's, Daily Weights; patient is +1.312 L since admission -Avoid nephrotoxic medications, contrast dyes as well as hypotension possible -Repeat CMP in a.m.  Odynophagia in the setting of Esophagitis and distal esophageal narrowing suspected from a possible mass similar etiology -Speech therapy consulted. Discussed results with SLP who recommends barium swallow -Barium Swallow Results showed extensive esophagitis with small ulcerations of plaque-like lesions extending from the lower cervical esophagus to the lower thoracic esophagus; differentials included Candida esophagitis, cytomegalovirus esophagitis, herpes esophagitis, or glycogenic acanthosis there is also considerable narrowing of the distal esophagus the possibility of the distal esophageal mass is not fully excluded -Gastroenterology is consulted for further evaluation recommendation patient was placed on IV PPI twice daily and also started on fluconazole per pharmacy to dose for suspected Candida esophagitis -We will have gastroenterology Dr. Collene Mares evaluate for possible EGD and further evaluation to reassess the distal esophagus along with evaluating esophagitis; patient is undergoing a upper endoscopy done by Dr. Benson Norway later this afternoon -CMV antibody IgG and IgM ordered and pending  Hypokalemia -Significantly low and now improved to 5.1 -Replete with supplementation IV KCl 40 mEQ and with NS + 40 mEQ KCl at 75 mL/hr yesterday and IVF now changed to just NS at 75 mL/hr without Potassium Supplementation -Mag Level was Normal -Continue to Monitor and Replete as Necessary -Repeat CMP in AM   Hyponatremia, improved  -Mild. In setting of poor oral intake and now improved -IV fluids as above  -Continue to Monitor and Trend and Repeat CMP in AM  Pyuria -Urinalysis showed hazy  appearance, moderate hemoglobin, moderate leukocytes, rare bacteria, 6-10 RBCs per high-power field, and 21-50 WBCs -Unclear if patient was having  symptoms but will need to evaluate this further. -Ceftriaxone was started empirically. Urine cultures obtained -Follow urine culture and showed no growth -Discontinue antibiotics given lack of symptoms and fever; WBC is normal  Essential Hypertension -Slightly uncontrolled. On amlodipine, Maxide at home -BP was 145/75 -Continue Amlodipine 5 mg po Daily   Hyperphosphatemia -> Hypophosphatemia  Hypermagnesemia -In setting of AKI. Improved as Mag is 2.1 and Phos is 2.2 -Replete Phos with po KPhos at 500 mg x1  -Continue to Monitor and repeat Mag and Phos Level in AM   Hypothyroidism -TSH was 0.968 -Continue Levothyroxine 50 mcg po Daily   PAD -Patient previously on Aspirin and Plavix. Per chart review, did not tolerate plavix. -Continue Aspirin 81 mg po Daily   Rectal Cancer -Follows with Dr. Burr Medico. -Medical Oncology Consulted for further evaluation and recc's and patient is currently on observation from a rectal cancer standpoint and they are planning for outpatient follow-up for routine monitoring  Diabetes Mellitus Type 2 -Patient is on metformin and glimepiride as an outpatient.  -HbA1c this Visit was 6.7 -Continue SSI Sensitive Scale  -Patient may benefit from medication adjustment on discharge (discontinuing glimepiride) secondary to age -CBG's ranging from 69-258 -Continue to Adjust Insulin as Necessary   Right sided weakness History of CVA -CT head unremarkable -C/w Aspirin 81 mg po Daily and Atorvastatin 10 mg po Daily   Normocytic Anemia -Patient's Hgb/Hct went from 11.6/34.3 -> 10.4/31.2 -> 9.9/30.2 -> 9.8/30.9 -C/w Iron Polysaccharides 150 mg po BID -Checked Anemia Panel and showed an iron level of 32, U IBC of 257, TIBC of 289, saturation ratios of 11%, ferritin level of 229, folate level 9.2, and vitamin B12 level 574 -Continue to Monitor for S/Sx of Bleeding; Currently no overt bleeding noted -Repeat CBC in AM   DVT prophylaxis: SCDs Code Status:  FULL CODE Family Communication: No family present at bedside  Disposition Plan: SNF if agreeable and once evaluated by GI and cleared   Consultants:   Medical Oncology  Nutrition  Gastroenterology    Procedures: DG Esophagus   Antimicrobials:  Anti-infectives (From admission, onward)   Start     Dose/Rate Route Frequency Ordered Stop   10/03/18 1800  fluconazole (DIFLUCAN) tablet 100 mg     100 mg Oral  Once 10/03/18 1713 10/03/18 1835   10/03/18 1100  fluconazole (DIFLUCAN) tablet 100 mg     100 mg Oral Daily 10/03/18 1007 10/10/18 0959   10/02/18 2100  cefTRIAXone (ROCEPHIN) 1 g in sodium chloride 0.9 % 100 mL IVPB     1 g 200 mL/hr over 30 Minutes Intravenous Every 24 hours 10/02/18 0022     10/01/18 2145  cefTRIAXone (ROCEPHIN) 1 g in sodium chloride 0.9 % 100 mL IVPB     1 g 200 mL/hr over 30 Minutes Intravenous  Once 10/01/18 2136 10/01/18 2227     Subjective: Seen and examined at bedside and was little agitated that her EGD was will be later in afternoon as she was hungry all morning and states that she is has not had anything to eat since 7 AM.  No chest pain, lightheadedness or dizziness and thinks that she is tolerating her food a little bit better.  I spoke with nursing and states that she still has a little trouble with her pills swallowing.  Patient still states that she has some epigastric discomfort.  No nausea or vomiting.  No other concerns otherwise at this time.  Objective: Vitals:   10/03/18 1622 10/03/18 2011 10/04/18 0000 10/04/18 0402  BP: 134/64 (!) 125/58 139/70 (!) 145/75  Pulse: 66 74 71 66  Resp: 14 16 16 16   Temp: 98.8 F (37.1 C) 98.7 F (37.1 C) 98.8 F (37.1 C) 98.5 F (36.9 C)  TempSrc: Oral Oral    SpO2: 100% 100% 100% 100%  Weight:      Height:        Intake/Output Summary (Last 24 hours) at 10/04/2018 1310 Last data filed at 10/04/2018 0920 Gross per 24 hour  Intake 1575.99 ml  Output 1300 ml  Net 275.99 ml   Filed Weights    10/01/18 1324  Weight: 70.8 kg   Examination: Physical Exam:  Constitutional: WN/WD overweight African-American female in no acute distress appears slightly agitated  Eyes: Lids and conjunctivae normal, sclerae anicteric  ENMT: External Ears, Nose appear normal. Grossly normal hearing. Mucous membranes are moist.  Neck: Appears normal, supple, no cervical masses, normal ROM, no appreciable thyromegaly; no JVD Respiratory: Diminished to auscultation bilaterally, no wheezing, rales, rhonchi or crackles. Normal respiratory effort and patient is not tachypenic. No accessory muscle use.  Cardiovascular: RRR, Has 2/6 systolic murmur appreciated. S1 and S2 auscultated.  Abdomen: Soft, non-tender, Mildly distended. Bowel sounds positive x4.  GU: Deferred. Musculoskeletal: No clubbing / cyanosis of digits/nails. No joint deformity upper and lower extremities.  Skin: No rashes, lesions, ulcers on a limited skin evaluation No induration; Warm and dry.  Neurologic: CN 2-12 grossly intact with no focal deficits. Romberg sign and cerebellar reflexes not assessed.  Psychiatric: Normal judgment and insight. Alert and oriented x 3. Agitated and slightly anxious mood and appropriate affect.   Data Reviewed: I have personally reviewed following labs and imaging studies  CBC: Recent Labs  Lab 10/01/18 2034 10/02/18 0409 10/03/18 0530 10/04/18 0419  WBC 5.2 5.0 4.2 4.2  NEUTROABS 4.4  --   --  3.0  HGB 11.6* 10.4* 9.9* 9.8*  HCT 34.3* 31.2* 30.2* 30.9*  MCV 86.0 86.4 89.3 92.5  PLT 348 344 303 893   Basic Metabolic Panel: Recent Labs  Lab 10/01/18 2034 10/01/18 2330 10/02/18 0409 10/02/18 1418 10/03/18 0530 10/04/18 0419  NA 130*  --  132*  --  137 142  K 2.4*  --  2.3* 2.7* 2.9* 5.1  CL 87*  --  95*  --  107 113*  CO2 22  --  23  --  23 21*  GLUCOSE 316*  --  213*  --  128* 157*  BUN 156*  --  140*  --  112* 65*  CREATININE 4.21*  --  3.16*  --  1.77* 1.48*  CALCIUM 10.3  --  9.7  --   9.5 9.3  MG  --  2.6* 2.6*  --  2.3 2.1  PHOS  --  7.4* 4.9*  --  2.5 2.2*   GFR: Estimated Creatinine Clearance: 33.1 mL/min (A) (by C-G formula based on SCr of 1.48 mg/dL (H)). Liver Function Tests: Recent Labs  Lab 10/01/18 2034 10/02/18 0409 10/03/18 0530 10/04/18 0419  AST 22 23 31 28   ALT 15 14 16 16   ALKPHOS 91 77 77 76  BILITOT 0.8 0.4 0.4 0.5  PROT 8.2* 7.1 6.5 6.4*  ALBUMIN 4.4 3.8 3.3* 3.1*   No results for input(s): LIPASE, AMYLASE in the last 168 hours. No results for input(s): AMMONIA in the last  168 hours. Coagulation Profile: No results for input(s): INR, PROTIME in the last 168 hours. Cardiac Enzymes: Recent Labs  Lab 10/01/18 2330  CKTOTAL 132   BNP (last 3 results) No results for input(s): PROBNP in the last 8760 hours. HbA1C: Recent Labs    10/02/18 0409  HGBA1C 6.7*   CBG: Recent Labs  Lab 10/03/18 2015 10/04/18 0001 10/04/18 0411 10/04/18 0823 10/04/18 1146  GLUCAP 258* 90 175* 98 156*   Lipid Profile: No results for input(s): CHOL, HDL, LDLCALC, TRIG, CHOLHDL, LDLDIRECT in the last 72 hours. Thyroid Function Tests: Recent Labs    10/02/18 0409  TSH 0.968   Anemia Panel: Recent Labs    10/04/18 0419  VITAMINB12 574  FOLATE 9.2  FERRITIN 229  TIBC 289  IRON 32  RETICCTPCT 1.6   Sepsis Labs: No results for input(s): PROCALCITON, LATICACIDVEN in the last 168 hours.  Recent Results (from the past 240 hour(s))  Urine culture     Status: None   Collection Time: 10/01/18  7:37 PM   Specimen: Urine, Clean Catch  Result Value Ref Range Status   Specimen Description   Final    URINE, CLEAN CATCH Performed at [facility], [place] [address] [place], [place] [address]    Special Requests   Final    NONE Performed at [facility], [place] [address], [place], [place] [address]    Culture   Final    NO GROWTH Performed at [facility], [place] [address] [place], [place] [address]     Report Status 10/02/2018 FINAL  Final  SARS Coronavirus 2 [facility] order, Performed in [facility] hospital lab) Nasopharyngeal Nasopharyngeal Swab     Status: None   Collection Time: 10/04/18  8:49 AM   Specimen: Nasopharyngeal Swab  Result Value Ref Range Status   SARS Coronavirus 2 NEGATIVE NEGATIVE Final    Comment: (NOTE) If result is NEGATIVE SARS-CoV-2 target nucleic acids are NOT DETECTED. The SARS-CoV-2 RNA is generally detectable in upper and lower  respiratory specimens during the acute phase of infection. The lowest  concentration of SARS-CoV-2 viral copies this assay can detect is 250  copies / mL. A negative result does not preclude SARS-CoV-2 infection  and should not be used as the sole basis for treatment or other  patient management decisions.  A negative result may occur with  improper specimen collection / handling, submission of specimen other  than nasopharyngeal swab, presence of viral mutation(s) within the  areas targeted by this assay, and inadequate number of viral copies  (<250 copies / mL). A negative result must be combined with clinical  observations, patient history, and epidemiological information. If result is POSITIVE SARS-CoV-2 target nucleic acids are DETECTED. The SARS-CoV-2 RNA is generally detectable in upper and lower  respiratory specimens dur ing the acute phase of infection.  Positive  results are indicative of active infection with SARS-CoV-2.  Clinical  correlation with patient history and other diagnostic information is  necessary to determine patient infection status.  Positive results do  not rule out bacterial infection or co-infection with other viruses. If result is PRESUMPTIVE POSTIVE SARS-CoV-2 nucleic acids MAY BE PRESENT.   A presumptive positive result was obtained on the submitted specimen  and confirmed on repeat testing.  While 2019 novel coronavirus  (SARS-CoV-2) nucleic acids may be present in the submitted sample   additional confirmatory testing may be necessary for epidemiological  and / or clinical management purposes  to differentiate between  SARS-CoV-2 and other Sarbecovirus currently known to infect humans.  If clinically indicated additional testing with an alternate test  methodology [phone]) is advised. The SARS-CoV-2 RNA is generally  detectable in upper and lower respiratory sp ecimens during the acute  phase of infection. The expected result is Negative. Fact Sheet for Patients:  StrictlyIdeas.no Fact Sheet for Healthcare Providers: BankingDealers.co.za This test is not yet approved or cleared by the [place] FDA and has been authorized for detection and/or diagnosis of SARS-CoV-2 by FDA under an Emergency Use Authorization (EUA).  This EUA will remain in effect (meaning this test can be used) for the duration of the COVID-19 declaration under Section 564(b)(1) of the Act, 21 U.S.C. section 360bbb-3(b)(1), unless the authorization is terminated or revoked sooner. Performed at [facility], [place] [address], [place], [place] [address]     Radiology Studies: Dg Esophagus W Double Cm (hd)  Result Date: 10/03/2018 CLINICAL DATA:  Odynophagia. History of colon cancer with lower anterior resection. EXAM: ESOPHOGRAM/BARIUM SWALLOW TECHNIQUE: Combined double contrast and single contrast examination performed using effervescent crystals, thick barium liquid, and thin barium liquid. FLUOROSCOPY TIME:  Fluoroscopy Time:  2 minutes, 12 seconds Radiation Exposure Index (if provided by the fluoroscopic device): 9.7 mGy Number of Acquired Spot Images: 0 COMPARISON:  CT chest from 10/25/2017 FINDINGS: The patient is somewhat infirm and slow to move. I judged that with minor assistance she would be able to stand for example for the pharyngeal phase images and for double contrast images, but she did not have ideal mobility for the exam,  and also refused to swallow the barium pill. We performed LPO rather than RAO images as I was skeptical she would be able to comfortably lay on her belly. On the pharyngeal phase of swallowing the patient retained barium in the mouth and only swallowed boluses of the oral barium at a time. There is an episode of laryngeal penetration, and also some mild vallecular stasis Starting in the lowest portion of the cervical esophagus but primarily involving the thoracic esophagus, we demonstrate shaggy nodular irregularity of the esophageal wall for example as shown on image 3/4. This has an appearance suggesting small marginal ulcerations and plaque-like lesions. There some sparing of the distal 25% of the thoracic esophagus. Nodularity in the distal esophagus is observed for example on image 33/8, which could be from polyp, ulceration, or a small mass. The filling defect measures about 7 by 5 mm on image 32/8. However, this was somewhat transient. Because the patient took small swallows and possibly due to intrinsic disease, the distal esophagus and gastroesophageal junction remain narrow. I was only able to distend these regions up to about 0.5 cm. Because the patient refused the barium pill I was unable to challenge the region with a larger non liquid bolus. The primary peristaltic waves in the esophagus were not disrupted. IMPRESSION: 1. Extensive esophagitis with small ulcerations and plaque-like lesions extending from the lower cervical esophagus to the lower thoracic esophagus. Top differential diagnostic considerations for this distribution and appearance include Candida esophagitis, cytomegalovirus esophagitis, herpes esophagitis, or glycogenic acanthosis. 2. Considerable narrowing of the distal esophagus. On series 8 there was a transient filling defect in this vicinity which may been incidental but could be from a varix or possibly even a mass. Because of the relatively small boluses the patient was able to  swallow, I am not confident that I was able to fully fluid distend the distal esophagus, the  patient was unable to swallow a barium pill. The possibility of a distal esophageal mass is not totally excluded, and after treatment for the patient's presumed esophagitis, consider either endoscopy or repeat esophagram to reassess the distal esophagus. Electronically Signed   By: Van Clines M.D.   On: 10/03/2018 10:13   Scheduled Meds: . amLODipine  5 mg Oral Daily  . aspirin EC  81 mg Oral Daily  . atorvastatin  10 mg Oral Daily  . Chlorhexidine Gluconate Cloth  6 each Topical Daily  . feeding supplement  1 Container Oral TID BM  . fluconazole  100 mg Oral Daily  . gabapentin  300 mg Oral Q12H  . insulin aspart  0-9 Units Subcutaneous Q4H  . iron polysaccharides  150 mg Oral BID  . levothyroxine  50 mcg Oral Q0600  . loratadine  10 mg Oral Daily  . mirtazapine  15 mg Oral QHS  . multivitamin with minerals  1 tablet Oral Daily  . pantoprazole (PROTONIX) IV  40 mg Intravenous Q12H  . thiamine injection  100 mg Intravenous Daily   Continuous Infusions: . sodium chloride    . sodium chloride 75 mL/hr at 10/04/18 0917  . cefTRIAXone (ROCEPHIN)  IV 1 g (10/03/18 2047)    LOS: 3 days   Kerney Elbe, DO [facility] PAGER is on [place]  If 7PM-7AM, please contact night-coverage [email or web address] Password [email or web address] 10/04/2018, 1:10 PM

## 2018-10-05 ENCOUNTER — Inpatient Hospital Stay: Payer: Medicare Other | Admitting: Hematology

## 2018-10-05 ENCOUNTER — Inpatient Hospital Stay: Payer: Medicare Other

## 2018-10-05 ENCOUNTER — Encounter (HOSPITAL_COMMUNITY): Payer: Self-pay | Admitting: Gastroenterology

## 2018-10-05 ENCOUNTER — Telehealth: Payer: Self-pay | Admitting: Hematology

## 2018-10-05 LAB — COMPREHENSIVE METABOLIC PANEL
ALT: 18 U/L (ref 0–44)
AST: 30 U/L (ref 15–41)
Albumin: 3.1 g/dL — ABNORMAL LOW (ref 3.5–5.0)
Alkaline Phosphatase: 92 U/L (ref 38–126)
Anion gap: 8 (ref 5–15)
BUN: 39 mg/dL — ABNORMAL HIGH (ref 8–23)
CO2: 19 mmol/L — ABNORMAL LOW (ref 22–32)
Calcium: 9.1 mg/dL (ref 8.9–10.3)
Chloride: 110 mmol/L (ref 98–111)
Creatinine, Ser: 1.27 mg/dL — ABNORMAL HIGH (ref 0.44–1.00)
GFR calc Af Amer: 50 mL/min — ABNORMAL LOW (ref >60)
GFR calc non Af Amer: 43 mL/min — ABNORMAL LOW (ref >60)
Glucose, Bld: 180 mg/dL — ABNORMAL HIGH (ref 70–99)
Potassium: 4 mmol/L (ref 3.5–5.1)
Sodium: 137 mmol/L (ref 135–145)
Total Bilirubin: 0.4 mg/dL (ref 0.3–1.2)
Total Protein: 6.4 g/dL — ABNORMAL LOW (ref 6.5–8.1)

## 2018-10-05 LAB — PHOSPHORUS: Phosphorus: 2 mg/dL — ABNORMAL LOW (ref 2.5–4.6)

## 2018-10-05 LAB — GLUCOSE, CAPILLARY
Glucose-Capillary: 113 mg/dL — ABNORMAL HIGH (ref 70–99)
Glucose-Capillary: 119 mg/dL — ABNORMAL HIGH (ref 70–99)
Glucose-Capillary: 141 mg/dL — ABNORMAL HIGH (ref 70–99)
Glucose-Capillary: 170 mg/dL — ABNORMAL HIGH (ref 70–99)
Glucose-Capillary: 225 mg/dL — ABNORMAL HIGH (ref 70–99)
Glucose-Capillary: 262 mg/dL — ABNORMAL HIGH (ref 70–99)
Glucose-Capillary: 279 mg/dL — ABNORMAL HIGH (ref 70–99)

## 2018-10-05 LAB — CBC WITH DIFFERENTIAL/PLATELET
Abs Immature Granulocytes: 0.02 10*3/uL (ref 0.00–0.07)
Basophils Absolute: 0 10*3/uL (ref 0.0–0.1)
Basophils Relative: 1 %
Eosinophils Absolute: 0.3 10*3/uL (ref 0.0–0.5)
Eosinophils Relative: 7 %
HCT: 30.1 % — ABNORMAL LOW (ref 36.0–46.0)
Hemoglobin: 9.4 g/dL — ABNORMAL LOW (ref 12.0–15.0)
Immature Granulocytes: 1 %
Lymphocytes Relative: 14 %
Lymphs Abs: 0.6 10*3/uL — ABNORMAL LOW (ref 0.7–4.0)
MCH: 29.7 pg (ref 26.0–34.0)
MCHC: 31.2 g/dL (ref 30.0–36.0)
MCV: 95 fL (ref 80.0–100.0)
Monocytes Absolute: 0.4 10*3/uL (ref 0.1–1.0)
Monocytes Relative: 9 %
Neutro Abs: 3 10*3/uL (ref 1.7–7.7)
Neutrophils Relative %: 68 %
Platelets: 261 10*3/uL (ref 150–400)
RBC: 3.17 MIL/uL — ABNORMAL LOW (ref 3.87–5.11)
RDW: 15.2 % (ref 11.5–15.5)
WBC: 4.3 10*3/uL (ref 4.0–10.5)
nRBC: 0 % (ref 0.0–0.2)

## 2018-10-05 LAB — MAGNESIUM: Magnesium: 1.7 mg/dL (ref 1.7–2.4)

## 2018-10-05 MED ORDER — VITAMIN B-1 100 MG PO TABS
100.0000 mg | ORAL_TABLET | Freq: Every day | ORAL | Status: DC
  Administered 2018-10-06: 100 mg via ORAL
  Filled 2018-10-05: qty 1

## 2018-10-05 MED ORDER — K PHOS MONO-SOD PHOS DI & MONO 155-852-130 MG PO TABS
500.0000 mg | ORAL_TABLET | ORAL | Status: AC
  Administered 2018-10-05 (×2): 500 mg via ORAL
  Filled 2018-10-05 (×2): qty 2

## 2018-10-05 MED ORDER — SODIUM BICARBONATE-DEXTROSE 150-5 MEQ/L-% IV SOLN
150.0000 meq | INTRAVENOUS | Status: AC
  Administered 2018-10-05: 09:00:00 150 meq via INTRAVENOUS
  Filled 2018-10-05 (×2): qty 1000

## 2018-10-05 NOTE — Progress Notes (Signed)
PROGRESS NOTE    Nicole Sellers  OVF:[id number] DOB: July 01, 1950 DOA: 10/01/2018 PCP: Reynold Bowen, MD   Brief Narrative:  Nicole Sellers is a 68 y.o. femalewith medical history significant of Colon CA,anemia, clotting disorder, lower limb ischemia stenting, GERD, HLD, HTN, hypothyroidism,Dm2, tobacco abuse. Patient presented secondary to decreased oral intake/dysphagia and weakness  **Interim History Patient is AKI improving and SLP evaluated and did a barium swallow which showed extensive esophagitis and severe ulcerations and distal esophageal narrowing.  She was started on IV PPI twice daily and also started on fluconazole for pharmacy to dose for suspected candidal esophagitis.  We will have GI evaluate for further endoscopic evaluation for narrowed esophagus and esophagitis and she went an upper endoscopy yesterday and found to have a benign intrinsic severe stenosis that was 20 cm from the incisors and stenosis measured 5 mm pulmonary diameter 1 cm Stenosis transverse downsizing scope and a guidewire was placed with dilatation performed.  Assessment & Plan:   Active Problems:   Essential hypertension   Non-insulin treated type 2 diabetes mellitus (HCC)   PAD (peripheral artery disease) Rt ABI 0.7, Lt ABI 0.46   Rectal cancer (HCC)   Dehydration   AKI (acute kidney injury) (HCC)   Hypokalemia   Pyuria   Odynophagia   Hyponatremia   Right sided weakness   Oral candidiasis   Dehydration, improving Secondary to dysphagia issues -See below -Continue IV fluid hydration as below now that is been reduced to 75 mL's per hour for 10 more hours  -C/w CLD for now  AKI, improving  Metabolic Acidosis -Secondary to poor oral intake and resultant dehydration as mentioned above. Baseline creatinine of about 1. Creatinine on admission of 4.21 with an associated BUN of 156. Trended down since admission and now BUN/Cr is 39/1.27 -CO2 was 19, AG was 8, and Chloride was 110 -Changed  IVF to Sodium Bicarbonate 150 mEQ at 75 mL/hr x 10 hours  -Strict I's and O's, Daily Weights; patient is +2.629 L since admission -Avoid nephrotoxic medications, contrast dyes as well as hypotension possible -Repeat CMP in a.m.  Odynophagia in the setting of Esophagitis and distal esophageal narrowing s/p Dilation  -Speech therapy consulted. Discussed results with SLP who recommends barium swallow -Barium Swallow Results showed extensive esophagitis with small ulcerations of plaque-like lesions extending from the lower cervical esophagus to the lower thoracic esophagus; differentials included Candida esophagitis, cytomegalovirus esophagitis, herpes esophagitis, or glycogenic acanthosis there is also considerable narrowing of the distal esophagus the possibility of the distal esophageal mass is not fully excluded -Gastroenterology is consulted for further evaluation recommendation patient was placed on IV PPI twice daily and also started on fluconazole per pharmacy to dose for suspected Candida esophagitis -We will have gastroenterology Dr. Collene Mares evaluate for possible EGD and further evaluation to reassess the distal esophagus along with evaluating esophagitis;  -Patient underwent EGD yesterday and showed "Benign-appearing esophageal stenosis. Dilated. Normal stomach. Normal examined duodenum. No specimens collected." -CMV antibody IgG and IgM; IgM was <30.0 and IgG was 9.10 indicating history of CMV and ? Related to Esophagitis  -C/w CLD for now and advancement per GI   Hypokalemia -Significantly low and now improved to 4.0 -IVF where changed and now on Sodium Bicarbonate 150 mEQ at 75 mL/hr x 10 hours  -Mag Level was Normal -Continue to Monitor and Replete as Necessary -Repeat CMP in AM   Hyponatremia, improved  -Mild. In setting of poor oral intake and now improved -IV fluids as  above  -Continue to Monitor and Trend and Repeat CMP in AM  Pyuria -Urinalysis showed hazy appearance,  moderate hemoglobin, moderate leukocytes, rare bacteria, 6-10 RBCs per high-power field, and 21-50 WBCs -Unclear if patient was having symptoms but will need to evaluate this further. -Ceftriaxone was started empirically. Urine cultures obtained -Follow urine culture and showed no growth -Discontinued antibiotics given lack of symptoms and fever; WBC is normal  Essential Hypertension -Slightly uncontrolled. On amlodipine, Maxide at home -BP was 133/78 -Continue Amlodipine 5 mg po Daily and hold Maxide given AKI   Hyperphosphatemia -> Hypophosphatemia  Hypermagnesemia -In setting of AKI. Improved as Mag is 1.7 and Phos is 2.0 -Replete Phos with po KPhos at 500 mg x2 doses  -Continue to Monitor and repeat Mag and Phos Level in AM   Hypothyroidism -TSH was 0.968 -Continue Levothyroxine 50 mcg po Daily   PAD -Patient previously on Aspirin and Plavix. Per chart review, did not tolerate plavix. -Continue Aspirin 81 mg po Daily   Rectal Cancer -Follows with Dr. Burr Medico. -Has an Ostomy and was leaking today  -Medical Oncology Consulted for further evaluation and recc's and patient is currently on observation from a rectal cancer standpoint and they are planning for outpatient follow-up for routine monitoring  Diabetes Mellitus Type 2 -Patient is on metformin and glimepiride as an outpatient.  -HbA1c this Visit was 6.7 -Continue SSI Sensitive Scale  -Patient may benefit from medication adjustment on discharge (discontinuing glimepiride) secondary to age -CBG's ranging from 119-262 -Continue to Adjust Insulin as Necessary   Right sided weakness History of CVA -CT head unremarkable -C/w Aspirin 81 mg po Daily and Atorvastatin 10 mg po Daily   Normocytic Anemia -Patient's Hgb/Hct went from 11.6/34.3 -> 10.4/31.2 -> 9.9/30.2 -> 9.8/30.9 -> 9.4/30.1 -C/w Iron Polysaccharides 150 mg po BID -Checked Anemia Panel and showed an iron level of 32, U IBC of 257, TIBC of 289, saturation  ratios of 11%, ferritin level of 229, folate level 9.2, and vitamin B12 level 574 -Continue to Monitor for S/Sx of Bleeding; Currently no overt bleeding noted -Repeat CBC in AM   DVT prophylaxis: SCDs Code Status: FULL CODE Family Communication: No family present at bedside  Disposition Plan: SNF in the next 24-48 hours   Consultants:   Medical Oncology  Nutrition  Gastroenterology    Procedures: DG Esophagus EGD Findings:      One benign-appearing, intrinsic severe stenosis was found 20 cm from the       incisors. This stenosis measured 5 mm (inner diameter) x 1 cm (in       length). The stenosis was traversed after downsizing scope. A guidewire       was placed and the scope was withdrawn. Dilation was performed with a       Savary dilator with no resistance at 8 mm. The dilation site was       examined following endoscope reinsertion and showed complete resolution       of luminal narrowing. Estimated blood loss was minimal.      The stomach was normal.      The examined duodenum was normal.      A severe benign intrinsic stenosis was found at 20 cm. The adult       endoscope was not able to pass through. The ultraslim upper endoscope       was employed with with some maneuvering the endoscope passed through the       stenosis. The entire esophagus appeared  to have evidence of scarring. It       is not clear if this was solely from a GERD esophagitis or another       etiology. The rest of the upper examination was normal. Over a guidewire       the esophagus was dilated using the following Savary dilators: 5 mm, 6       mm, 7 mm, and then the 8 mm. The appropriate mucosal disruption was       achieved and there was no evidence of crepitus during or after the       procedure. Impression:               - Benign-appearing esophageal stenosis. Dilated.                           - Normal stomach.                           - Normal examined duodenum.                            - No specimens collected.   Antimicrobials:  Anti-infectives (From admission, onward)   Start     Dose/Rate Route Frequency Ordered Stop   10/03/18 1800  fluconazole (DIFLUCAN) tablet 100 mg     100 mg Oral  Once 10/03/18 1713 10/03/18 1835   10/03/18 1100  fluconazole (DIFLUCAN) tablet 100 mg     100 mg Oral Daily 10/03/18 1007 10/10/18 0959   10/02/18 2100  cefTRIAXone (ROCEPHIN) 1 g in sodium chloride 0.9 % 100 mL IVPB  Status:  Discontinued     1 g 200 mL/hr over 30 Minutes Intravenous Every 24 hours 10/02/18 0022 10/04/18 1334   10/01/18 2145  cefTRIAXone (ROCEPHIN) 1 g in sodium chloride 0.9 % 100 mL IVPB     1 g 200 mL/hr over 30 Minutes Intravenous  Once 10/01/18 2136 10/01/18 2227     Subjective: Seen and examined at bedside she is doing a little bit better.  Was surprised that her esophagus was dilated.  Does not want to particularly go to a SNF but understands if she is still significantly weak and is agreeable to going.  Denies any nausea or vomiting.  No other concerns or complaints at this time.  Objective: Vitals:   10/04/18 1726 10/04/18 2038 10/05/18 0412 10/05/18 1311  BP: (!) 157/67 (!) 147/87 130/67 133/78  Pulse: 71 97 79 88  Resp: 18 20 19 20   Temp: 97.8 F (36.6 C) 98.1 F (36.7 C) 98.8 F (37.1 C) 98.3 F (36.8 C)  TempSrc: Oral   Oral  SpO2: 100% 100% 100%   Weight:      Height:        Intake/Output Summary (Last 24 hours) at 10/05/2018 1431 Last data filed at 10/05/2018 1131 Gross per 24 hour  Intake 1284.02 ml  Output 500 ml  Net 784.02 ml   Filed Weights   10/01/18 1324  Weight: 70.8 kg   Examination: Physical Exam:  Constitutional: WN/WD overweight African-American female currently in NAD and appears calm and comfortable talking on the phone Eyes:Lids and conjunctivae normal, sclerae anicteric  ENMT: External Ears, Nose appear normal. Grossly normal hearing.  Neck: Appears normal, supple, no cervical masses, normal ROM, no appreciable  thyromegaly; no JVD Respiratory: Mildly diminished to auscultation bilaterally, no  wheezing, rales, rhonchi or crackles. Normal respiratory effort and patient is not tachypenic. No accessory muscle use.  Cardiovascular: RRR, no murmurs / rubs / gallops. S1 and S2 auscultated. Trace extremity edema.  Abdomen: Soft, non-tender, mildly distended. Bowel sounds positive x4. Has an ileostomy  GU: Deferred. Musculoskeletal: No clubbing / cyanosis of digits/nails. No joint deformity upper and lower extremities. Skin: No rashes, lesions, ulcers on a limited skin evaluation. No induration; Warm and dry.  Neurologic: CN 2-12 grossly intact with no focal deficits. Romberg sign and cerebellar reflexes not assessed.  Psychiatric: Normal judgment and insight. Alert and oriented x 3. Normal mood and appropriate affect.   Data Reviewed: I have personally reviewed following labs and imaging studies  CBC: Recent Labs  Lab 10/01/18 2034 10/02/18 0409 10/03/18 0530 10/04/18 0419 10/05/18 0440  WBC 5.2 5.0 4.2 4.2 4.3  NEUTROABS 4.4  --   --  3.0 3.0  HGB 11.6* 10.4* 9.9* 9.8* 9.4*  HCT 34.3* 31.2* 30.2* 30.9* 30.1*  MCV 86.0 86.4 89.3 92.5 95.0  PLT 348 344 303 288 048   Basic Metabolic Panel: Recent Labs  Lab 10/01/18 2034 10/01/18 2330 10/02/18 0409 10/02/18 1418 10/03/18 0530 10/04/18 0419 10/05/18 0440  NA 130*  --  132*  --  137 142 137  K 2.4*  --  2.3* 2.7* 2.9* 5.1 4.0  CL 87*  --  95*  --  107 113* 110  CO2 22  --  23  --  23 21* 19*  GLUCOSE 316*  --  213*  --  128* 157* 180*  BUN 156*  --  140*  --  112* 65* 39*  CREATININE 4.21*  --  3.16*  --  1.77* 1.48* 1.27*  CALCIUM 10.3  --  9.7  --  9.5 9.3 9.1  MG  --  2.6* 2.6*  --  2.3 2.1 1.7  PHOS  --  7.4* 4.9*  --  2.5 2.2* 2.0*   GFR: Estimated Creatinine Clearance: 38.6 mL/min (A) (by C-G formula based on SCr of 1.27 mg/dL (H)). Liver Function Tests: Recent Labs  Lab 10/01/18 2034 10/02/18 0409 10/03/18 0530  10/04/18 0419 10/05/18 0440  AST 22 23 31 28 30   ALT 15 14 16 16 18   ALKPHOS 91 77 77 76 92  BILITOT 0.8 0.4 0.4 0.5 0.4  PROT 8.2* 7.1 6.5 6.4* 6.4*  ALBUMIN 4.4 3.8 3.3* 3.1* 3.1*   No results for input(s): LIPASE, AMYLASE in the last 168 hours. No results for input(s): AMMONIA in the last 168 hours. Coagulation Profile: No results for input(s): INR, PROTIME in the last 168 hours. Cardiac Enzymes: Recent Labs  Lab 10/01/18 2330  CKTOTAL 132   BNP (last 3 results) No results for input(s): PROBNP in the last 8760 hours. HbA1C: No results for input(s): HGBA1C in the last 72 hours. CBG: Recent Labs  Lab 10/04/18 2342 10/05/18 0408 10/05/18 0731 10/05/18 1138 10/05/18 1324  GLUCAP 277* 170* 119* 225* 262*   Lipid Profile: No results for input(s): CHOL, HDL, LDLCALC, TRIG, CHOLHDL, LDLDIRECT in the last 72 hours. Thyroid Function Tests: No results for input(s): TSH, T4TOTAL, FREET4, T3FREE, THYROIDAB in the last 72 hours. Anemia Panel: Recent Labs    10/04/18 0419  VITAMINB12 574  FOLATE 9.2  FERRITIN 229  TIBC 289  IRON 32  RETICCTPCT 1.6   Sepsis Labs: No results for input(s): PROCALCITON, LATICACIDVEN in the last 168 hours.  Recent Results (from the past 240 hour(s))  Urine  culture     Status: None   Collection Time: 10/01/18  7:37 PM   Specimen: Urine, Clean Catch  Result Value Ref Range Status   Specimen Description   Final    URINE, CLEAN CATCH Performed at [facility], [place] [address] [place], [place] [address]    Special Requests   Final    NONE Performed at [facility], [place] [address], [place], [place] [address]    Culture   Final    NO GROWTH Performed at [facility], [place] [address], [place], [place] [address]    Report Status 10/02/2018 FINAL  Final  SARS Coronavirus 2 [facility] order, Performed in [facility] hospital lab) Nasopharyngeal Nasopharyngeal Swab     Status: None   Collection Time:  10/04/18  8:49 AM   Specimen: Nasopharyngeal Swab  Result Value Ref Range Status   SARS Coronavirus 2 NEGATIVE NEGATIVE Final    Comment: (NOTE) If result is NEGATIVE SARS-CoV-2 target nucleic acids are NOT DETECTED. The SARS-CoV-2 RNA is generally detectable in upper and lower  respiratory specimens during the acute phase of infection. The lowest  concentration of SARS-CoV-2 viral copies this assay can detect is 250  copies / mL. A negative result does not preclude SARS-CoV-2 infection  and should not be used as the sole basis for treatment or other  patient management decisions.  A negative result may occur with  improper specimen collection / handling, submission of specimen other  than nasopharyngeal swab, presence of viral mutation(s) within the  areas targeted by this assay, and inadequate number of viral copies  (<250 copies / mL). A negative result must be combined with clinical  observations, patient history, and epidemiological information. If result is POSITIVE SARS-CoV-2 target nucleic acids are DETECTED. The SARS-CoV-2 RNA is generally detectable in upper and lower  respiratory specimens dur ing the acute phase of infection.  Positive  results are indicative of active infection with SARS-CoV-2.  Clinical  correlation with patient history and other diagnostic information is  necessary to determine patient infection status.  Positive results do  not rule out bacterial infection or co-infection with other viruses. If result is PRESUMPTIVE POSTIVE SARS-CoV-2 nucleic acids MAY BE PRESENT.   A presumptive positive result was obtained on the submitted specimen  and confirmed on repeat testing.  While 2019 novel coronavirus  (SARS-CoV-2) nucleic acids may be present in the submitted sample  additional confirmatory testing may be necessary for epidemiological  and / or clinical management purposes  to differentiate between  SARS-CoV-2 and other Sarbecovirus currently known to  infect humans.  If clinically indicated additional testing with an alternate test  methodology [phone]) is advised. The SARS-CoV-2 RNA is generally  detectable in upper and lower respiratory sp ecimens during the acute  phase of infection. The expected result is Negative. Fact Sheet for Patients:  StrictlyIdeas.no Fact Sheet for Healthcare Providers: BankingDealers.co.za This test is not yet approved or cleared by the [place] FDA and has been authorized for detection and/or diagnosis of SARS-CoV-2 by FDA under an Emergency Use Authorization (EUA).  This EUA will remain in effect (meaning this test can be used) for the duration of the COVID-19 declaration under Section 564(b)(1) of the Act, 21 U.S.C. section 360bbb-3(b)(1), unless the authorization is terminated or revoked sooner. Performed at [facility], [place] [address], [place], [place] [address]     Radiology Studies: No results found. Scheduled Meds: . amLODipine  5  mg Oral Daily  . aspirin EC  81 mg Oral Daily  . atorvastatin  10 mg Oral Daily  . Chlorhexidine Gluconate Cloth  6 each Topical Daily  . feeding supplement  1 Container Oral TID BM  . fluconazole  100 mg Oral Daily  . gabapentin  300 mg Oral Q12H  . insulin aspart  0-9 Units Subcutaneous Q4H  . iron polysaccharides  150 mg Oral BID  . levothyroxine  50 mcg Oral Q0600  . loratadine  10 mg Oral Daily  . mirtazapine  15 mg Oral QHS  . multivitamin with minerals  1 tablet Oral Daily  . pantoprazole (PROTONIX) IV  40 mg Intravenous Q12H  . [START ON 10/06/2018] thiamine  100 mg Oral Daily   Continuous Infusions: . sodium bicarbonate 150 mEq in dextrose 5% 1000 mL 150 mEq (10/05/18 0906)    LOS: 4 days   Kerney Elbe, DO [facility] PAGER is on [place]  If 7PM-7AM, please contact night-coverage [email or web address] Password [facility] 10/05/2018, 2:31 PM

## 2018-10-05 NOTE — Progress Notes (Signed)
Physical Therapy Treatment Patient Details Name: Nicole Sellers MRN: [id number] DOB: [birth year] Today's Date: 10/05/2018    History of Present Illness Nicole Sellers is a 68 y.o. female with medical history significant of Colon CA, anemia, clotting disorder, lower limb ischemia stenting, GERD, HLD, HTN, hypothyroidism, Dm2, tobacco abuse. Pt presented with decreased appetite for the past 3 weeks since she has undergone her surgery for colon cancer has trouble swallowing her food. She also reports generalized fatigue.    PT Comments    Patient is progressing gradually with therapy and performed gait training with SPC. She continues to requires assistance to initiate sit to stand transfers and min assist to steady upon rising. Gait challenges performed today and pt required min assist to prevent LOB with [facility] which she uses at baseline. She will benefit from additional skilled PT interventions at below venue to address impairments and progress towards PLOF. If pt refuses SNF she would require a RW to return home for increased stability with gait. Acute PT will follow and progress as able.   Follow Up Recommendations  SNF(pt may be able to return home pending progress, she lives alone and remains a fall risk currently)     Equipment Recommendations  Rolling walker with 5" wheels(pt would need RW if she were to go home versus SNF)    Recommendations for Other Services       Precautions / Restrictions Precautions Precautions: Fall Restrictions Weight Bearing Restrictions: No    Mobility  Bed Mobility Overal bed mobility: Needs Assistance Bed Mobility: Sit to Supine     Supine to sit: HOB elevated;Supervision     General bed mobility comments: pt using bed rails with HOB elevated, pt seated at EOB at endof session for dinner  Transfers Overall transfer level: Needs assistance Equipment used: Straight cane Transfers: Sit to/from Stand Sit to Stand: Min assist          General transfer comment: assistance required to initiate power up from EOB  Ambulation/Gait Ambulation/Gait assistance: Min assist Gait Distance (Feet): 200 Feet Assistive device: Straight cane Gait Pattern/deviations: Step-through pattern;Decreased stride length;Decreased step length - left;Decreased step length - right;Drifts right/left Gait velocity: decreased   General Gait Details: pt requires min assist intermittently to prevent LOB as she sways Rt/Lt, min guard through majority of gait wtih SPC, cues required for safe sequecning with cane as pt occasionally dragged it behind her   Stairs             Wheelchair Mobility    Modified Rankin (Stroke Patients Only)       Balance Overall balance assessment: Needs assistance Sitting-balance support: Feet supported;No upper extremity supported Sitting balance-Leahy Scale: Good     Standing balance support: During functional activity;No upper extremity supported Standing balance-Leahy Scale: Fair Standing balance comment: pt unsteady with gait and dynamic standign challenges requiring external support             High level balance activites: Head turns;Turns High Level Balance Comments: pt performed horizontal and vertical head turns during gait to challenge her balance, pt unable to maintain steadygait with challenge requiring min assist to steady            Cognition Arousal/Alertness: Awake/alert Behavior During Therapy: WFL for tasks assessed/performed Overall Cognitive Status: Within Functional Limits for tasks assessed                   Exercises      General Comments  Pertinent Vitals/Pain Pain Assessment: No/denies pain           PT Goals (current goals can now be found in the care plan section) Acute Rehab PT Goals Patient Stated Goal: to return to independent function PT Goal Formulation: With patient Time For Goal Achievement: 10/16/18 Potential to Achieve Goals:  Good Progress towards PT goals: Progressing toward goals    Frequency    Min 3X/week      PT Plan Current plan remains appropriate       AM-PAC PT "6 Clicks" Mobility   Outcome Measure  Help needed turning from your back to your side while in a flat bed without using bedrails?: A Little Help needed moving from lying on your back to sitting on the side of a flat bed without using bedrails?: A Little Help needed moving to and from a bed to a chair (including a wheelchair)?: A Little Help needed standing up from a chair using your arms (e.g., wheelchair or bedside chair)?: A Little Help needed to walk in hospital room?: A Little Help needed climbing 3-5 steps with a railing? : A Little 6 Click Score: 18    End of Session Equipment Utilized During Treatment: Gait belt Activity Tolerance: Patient tolerated treatment well Patient left: in bed;with bed alarm set;with nursing/sitter in room Nurse Communication: Mobility status PT Visit Diagnosis: Unsteadiness on feet (R26.81);Other abnormalities of gait and mobility (R26.89);Muscle weakness (generalized) (M62.81)     Time: 4496-7591 PT Time Calculation (min) (ACUTE ONLY): 28 min  Charges:  $Gait Training: 23-37 mins                     Kipp Brood, PT, DPT, [facility] Physical Therapist with [facility]  10/05/2018 7:45 PM

## 2018-10-05 NOTE — Consult Note (Signed)
[place] Nurse ostomy follow up Stoma type/location: LUQ ileostomy.  Patient is independent with care.  Consult for leaking pouch. Bedside RN unavailable.  Spoke with Karsten Fells, Camera operator.  Relayed that patient is familiar with care and only needs supplies.  Wears one piece flat (LAWSON # [id number]) and barrier ring (LAWSON # [id number]) .  Britney to assess and call back if needed. No further action needed by [facility] team at this time.  Will not follow at this time.  Please re-consult if needed.  Domenic Moras MSN, RN, FNP-BC [facility]ON Wound, Ostomy, Continence Nurse Pager [phone]

## 2018-10-05 NOTE — Progress Notes (Signed)
PHARMACIST - PHYSICIAN COMMUNICATION  DR:   Alfredia Ferguson  CONCERNING: IV to Oral Route Change Policy  RECOMMENDATION: This patient is receiving thiamine by the intravenous route.  Based on criteria approved by the Pharmacy and Therapeutics Committee, the intravenous medication(s) is/are being converted to the equivalent oral dose form(s).   DESCRIPTION: These criteria include:  The patient is eating (either orally or via tube) and/or has been taking other orally administered medications for a least 24 hours  The patient has no evidence of active gastrointestinal bleeding or impaired GI absorption (gastrectomy, short bowel, patient on TNA or NPO).  If you have questions about this conversion, please contact the Pharmacy Department  []   [phone] )  [facility] []   [phone] )  [facility] []   [phone] )  [facility] []   [phone] )  [facility] [x]   [phone] )  [facility]   10/05/2018 10:47 AM  Peggyann Juba, PharmD, BCPS

## 2018-10-05 NOTE — Progress Notes (Signed)
Occupational Therapy Treatment Patient Details Name: Nicole Sellers MRN: [id number] DOB: 09-Jun-1950 Today's Date: 10/05/2018    History of present illness Nicole Sellers is a 68 y.o. female with medical history significant of Colon CA, anemia, clotting disorder, lower limb ischemia stenting, GERD, HLD, HTN, hypothyroidism, Dm2, tobacco abuse. Pt presented with decreased appetite for the past 3 weeks since she has undergone her surgery for colon cancer has trouble swallowing her food. She also reports generalized fatigue.   OT comments  Pt. Seen for skilled OT session.  Able to complete in room mobility for b.room/toileting tasks min guard A.  Moving well.   Follow Up Recommendations  SNF;Supervision/Assistance - 24 hour    Equipment Recommendations       Recommendations for Other Services      Precautions / Restrictions Precautions Precautions: Fall       Mobility Bed Mobility   Bed Mobility: Sit to Supine       Sit to supine: Supervision   General bed mobility comments: pt sitting on EOB upon arrival  Transfers Overall transfer level: Needs assistance Equipment used: Straight cane Transfers: Sit to/from Stand;Stand Pivot Transfers Sit to Stand: Min guard Stand pivot transfers: Min guard       General transfer comment: assistance required to initiate power up from EOB    Balance                                           ADL either performed or assessed with clinical judgement   ADL Overall ADL's : Needs assistance/impaired                         Toilet Transfer: Magazine features editor Details (indicate cue type and reason): pt. uses her cane/walking stick Toileting- Clothing Manipulation and Hygiene: Supervision/safety;Sitting/lateral lean         General ADL Comments: pt. performs her own colsotomy bag care (was finsishing upon my arrival into her room)     Vision       Perception     Praxis      Cognition  Arousal/Alertness: Awake/alert Behavior During Therapy: WFL for tasks assessed/performed Overall Cognitive Status: Within Functional Limits for tasks assessed                                          Exercises     Shoulder Instructions       General Comments      Pertinent Vitals/ Pain          Home Living                                          Prior Functioning/Environment              Frequency  Min 2X/week        Progress Toward Goals  OT Goals(current goals can now be found in the care plan section)  Progress towards OT goals: Progressing toward goals     Plan      Co-evaluation                 AM-PAC OT "6  Clicks" Daily Activity     Outcome Measure   Help from another person eating meals?: None Help from another person taking care of personal grooming?: A Little Help from another person toileting, which includes using toliet, bedpan, or urinal?: A Little Help from another person bathing (including washing, rinsing, drying)?: A Little Help from another person to put on and taking off regular upper body clothing?: A Little Help from another person to put on and taking off regular lower body clothing?: A Little 6 Click Score: 19    End of Session Equipment Utilized During Treatment: Gait belt;Other (comment)(cane)  OT Visit Diagnosis: Unsteadiness on feet (R26.81);Other abnormalities of gait and mobility (R26.89);Muscle weakness (generalized) (M62.81)   Activity Tolerance Patient tolerated treatment well   Patient Left in bed;with call bell/phone within reach   Nurse Communication Other (comment)(spoke with cna, she reports ok to put bsc beside bed and pt. did not need bed alarm)        Time: 5993-5701 OT Time Calculation (min): 13 min  Charges: OT General Charges $OT Visit: 1 Visit OT Treatments $Self Care/Home Management : 8-22 mins   Janice Coffin, COTA/L 10/05/2018, 12:58  PM

## 2018-10-05 NOTE — Progress Notes (Signed)
PT Cancellation Note  Patient Details Name: Nicole Sellers MRN: [id number] DOB: 22-Sep-1950   Cancelled Treatment:    Reason Eval/Treat Not Completed: Other (comment)   Patient unable to participate at this time, her ostomy bag is leaking. RN has requested and ostomy consult to address current issue and PT will follow up with patient at later time/date when able to participate.   Kipp Brood, PT, DPT, [facility] Physical Therapist with [facility]  10/05/2018 1:42 PM

## 2018-10-05 NOTE — Telephone Encounter (Signed)
Per 9/25 schedule message moved 9/25 appointments to 10/16. Left message for patient. Schedule mailed.

## 2018-10-05 NOTE — Progress Notes (Signed)
Subjective: Since I last evaluated the patient, she seems to be doing much better.  FrequentlyShe feels the dilation has helped her swallowing significantly.  She denies having any abdominal pain nausea vomiting.   Objective: Vital signs in last 24 hours: Temp:  [98.1 F (36.7 C)-98.8 F (37.1 C)] 98.3 F (36.8 C) (09/25 1311) Pulse Rate:  [79-97] 88 (09/25 1311) Resp:  [19-20] 20 (09/25 1311) BP: (130-147)/(67-87) 133/78 (09/25 1311) SpO2:  [100 %] 100 % (09/25 0412) Last BM Date: 10/04/18  Intake/Output from previous day: 09/24 0701 - 09/25 0700 In: 396 [I.V.:396] Out: 575 [Stool:575] Intake/Output this shift: Total I/O In: 1862.4 [P.O.:1421; I.V.:441.4] Out: -   General appearance: alert, cooperative, appears stated age, no distress and pale Resp: clear to auscultation bilaterally Cardio: regular rate and rhythm, S1, S2 normal, no murmur, click, rub or gallop GI: soft, non-tender; bowel sounds normal; no masses,  no organomegaly; there is an ostomy bag noted on the abdomen Extremities: extremities normal, atraumatic, no cyanosis or edema  Lab Results: Recent Labs    10/03/18 0530 10/04/18 0419 10/05/18 0440  WBC 4.2 4.2 4.3  HGB 9.9* 9.8* 9.4*  HCT 30.2* 30.9* 30.1*  PLT 303 288 261   BMET Recent Labs    10/03/18 0530 10/04/18 0419 10/05/18 0440  NA 137 142 137  K 2.9* 5.1 4.0  CL 107 113* 110  CO2 23 21* 19*  GLUCOSE 128* 157* 180*  BUN 112* 65* 39*  CREATININE 1.77* 1.48* 1.27*  CALCIUM 9.5 9.3 9.1   LFT Recent Labs    10/03/18 0530  10/05/18 0440  PROT 6.5   < > 6.4*  ALBUMIN 3.3*   < > 3.1*  AST 31   < > 30  ALT 16   < > 18  ALKPHOS 77   < > 92  BILITOT 0.4   < > 0.4  BILIDIR 0.1  --   --   IBILI 0.3  --   --    < > = values in this interval not displayed.   Medications: I have reviewed the patient's current medications.  Assessment/Plan: 1) Dysphagia with esophageal stricture status post dilation with savory dilator seems to be doing  better continue PPIs and avoid nonsteroidals. 2) Rectal cancer T3N1 being followed by Dr. Burr Medico. 3) Acute kidney injury-seems to be improved. 4) Hypertension. 5) Hypothyroidism. 6) Hypokalemia.  Dr. Kizzie Ide from [facility] is on is covering this weekend; he is available for any questions or concerns that may arise over the weekend.   LOS: 4 days   Juanita Craver 10/05/2018, 6:09 PM

## 2018-10-06 DIAGNOSIS — K222 Esophageal obstruction: Secondary | ICD-10-CM

## 2018-10-06 LAB — MAGNESIUM: Magnesium: 1.6 mg/dL — ABNORMAL LOW (ref 1.7–2.4)

## 2018-10-06 LAB — CBC WITH DIFFERENTIAL/PLATELET
Abs Immature Granulocytes: 0.01 10*3/uL (ref 0.00–0.07)
Basophils Absolute: 0 10*3/uL (ref 0.0–0.1)
Basophils Relative: 1 %
Eosinophils Absolute: 0.2 10*3/uL (ref 0.0–0.5)
Eosinophils Relative: 6 %
HCT: 27 % — ABNORMAL LOW (ref 36.0–46.0)
Hemoglobin: 8.5 g/dL — ABNORMAL LOW (ref 12.0–15.0)
Immature Granulocytes: 0 %
Lymphocytes Relative: 15 %
Lymphs Abs: 0.5 10*3/uL — ABNORMAL LOW (ref 0.7–4.0)
MCH: 29.3 pg (ref 26.0–34.0)
MCHC: 31.5 g/dL (ref 30.0–36.0)
MCV: 93.1 fL (ref 80.0–100.0)
Monocytes Absolute: 0.3 10*3/uL (ref 0.1–1.0)
Monocytes Relative: 9 %
Neutro Abs: 2.2 10*3/uL (ref 1.7–7.7)
Neutrophils Relative %: 69 %
Platelets: 253 10*3/uL (ref 150–400)
RBC: 2.9 MIL/uL — ABNORMAL LOW (ref 3.87–5.11)
RDW: 15.2 % (ref 11.5–15.5)
WBC: 3.2 10*3/uL — ABNORMAL LOW (ref 4.0–10.5)
nRBC: 0 % (ref 0.0–0.2)

## 2018-10-06 LAB — COMPREHENSIVE METABOLIC PANEL
ALT: 13 U/L (ref 0–44)
AST: 21 U/L (ref 15–41)
Albumin: 2.7 g/dL — ABNORMAL LOW (ref 3.5–5.0)
Alkaline Phosphatase: 74 U/L (ref 38–126)
Anion gap: 7 (ref 5–15)
BUN: 20 mg/dL (ref 8–23)
CO2: 26 mmol/L (ref 22–32)
Calcium: 8.7 mg/dL — ABNORMAL LOW (ref 8.9–10.3)
Chloride: 107 mmol/L (ref 98–111)
Creatinine, Ser: 1.05 mg/dL — ABNORMAL HIGH (ref 0.44–1.00)
GFR calc Af Amer: >60 mL/min (ref >60)
GFR calc non Af Amer: 54 mL/min — ABNORMAL LOW (ref >60)
Glucose, Bld: 96 mg/dL (ref 70–99)
Potassium: 3.5 mmol/L (ref 3.5–5.1)
Sodium: 140 mmol/L (ref 135–145)
Total Bilirubin: 0.6 mg/dL (ref 0.3–1.2)
Total Protein: 5.3 g/dL — ABNORMAL LOW (ref 6.5–8.1)

## 2018-10-06 LAB — GLUCOSE, CAPILLARY
Glucose-Capillary: 102 mg/dL — ABNORMAL HIGH (ref 70–99)
Glucose-Capillary: 143 mg/dL — ABNORMAL HIGH (ref 70–99)
Glucose-Capillary: 132 mg/dL — ABNORMAL HIGH (ref 70–99)

## 2018-10-06 LAB — PHOSPHORUS: Phosphorus: 1.8 mg/dL — ABNORMAL LOW (ref 2.5–4.6)

## 2018-10-06 MED ORDER — MAGIC MOUTHWASH: 5.0000 mL | Freq: Three times a day (TID) | ORAL | 0 refills | Status: DC | PRN

## 2018-10-06 MED ORDER — FLUCONAZOLE 100 MG PO TABS: 100.0000 mg | ORAL_TABLET | Freq: Every day | ORAL | 0 refills | Status: DC

## 2018-10-06 MED ORDER — ONDANSETRON HCL 4 MG PO TABS: 4.0000 mg | ORAL_TABLET | Freq: Four times a day (QID) | ORAL | 0 refills | Status: DC | PRN

## 2018-10-06 MED ORDER — HEPARIN SOD (PORK) LOCK FLUSH 100 UNIT/ML IV SOLN
500.0000 [IU] | Freq: Once | INTRAVENOUS | Status: DC
  Filled 2018-10-06: qty 5

## 2018-10-06 MED ORDER — AMLODIPINE BESYLATE 10 MG PO TABS: 10.0000 mg | ORAL_TABLET | Freq: Every day | ORAL | 0 refills | Status: DC

## 2018-10-06 MED ORDER — THIAMINE HCL 100 MG PO TABS: 100.0000 mg | ORAL_TABLET | Freq: Every day | ORAL | 0 refills | Status: DC

## 2018-10-06 MED ORDER — HEPARIN SOD (PORK) LOCK FLUSH 100 UNIT/ML IV SOLN
500.0000 [IU] | INTRAVENOUS | Status: AC | PRN
  Administered 2018-10-06: 500 [IU]
  Filled 2018-10-06: qty 5

## 2018-10-06 MED ORDER — MAGNESIUM SULFATE 2 GM/50ML IV SOLN
2.0000 g | Freq: Once | INTRAVENOUS | Status: AC
  Administered 2018-10-06: 11:00:00 2 g via INTRAVENOUS
  Filled 2018-10-06: qty 50

## 2018-10-06 MED ORDER — POTASSIUM PHOSPHATES 15 MMOLE/5ML IV SOLN
30.0000 mmol | Freq: Once | INTRAVENOUS | Status: AC
  Administered 2018-10-06: 30 mmol via INTRAVENOUS
  Filled 2018-10-06: qty 10

## 2018-10-06 MED ORDER — PANTOPRAZOLE SODIUM 40 MG PO TBEC: 40.0000 mg | DELAYED_RELEASE_TABLET | Freq: Every day | ORAL | 0 refills | Status: DC

## 2018-10-06 NOTE — Discharge Summary (Signed)
Physician Discharge Summary  Nicole Sellers TGG:[id number] DOB: Mar 02, 1950 DOA: 10/01/2018  PCP: Reynold Bowen, MD  Admit date: 10/01/2018 Discharge date: 10/06/2018  Admitted From: Home Disposition: Home with [place] as she refused SNF now  Recommendations for Outpatient Follow-up:  1. Follow up with PCP in 1-2 weeks 2. Follow-up with gastroenterology in 1 to 2 weeks and repeat EGD at that time for redilation 3. Follow-up with Dr. Burr Medico in Medical Oncology as an outpatient  4. Her PCP addressed antihypertensive regimen as Maxide has been stopped 5. Please obtain CMP/CBC, mag, Foss within 1 week 6. Please follow up on the following pending results:  Home Health: Yes Equipment/Devices: Conservation officer, nature with a Seat   Discharge Condition: Stable CODE STATUS: FULL CODE   Diet recommendation: Soft Heart Healthy Diet  Brief/Interim Summary: Nicole Sellers a 68 y.o.femalewith medical history significant of Colon CA,anemia, clotting disorder, lower limb ischemia stenting, GERD, HLD, HTN, hypothyroidism,Dm2, tobacco abuse. Patient presented secondary to decreased oral intake/dysphagia and weakness  **Interim History Patient is AKI improving and SLP evaluated and did a barium swallow which showed extensive esophagitis and severe ulcerations and distal esophageal narrowing.  She was started on IV PPI twice daily and also started on fluconazole for pharmacy to dose for suspected candidal esophagitis.  We will have GI evaluate for further endoscopic evaluation for narrowed esophagus and esophagitis and she went an upper endoscopy yesterday and found to have a benign intrinsic severe stenosis that was 20 cm from the incisors and stenosis measured 5 mm pulmonary diameter 1 cm Stenosis transverse downsizing scope and a guidewire was placed with dilatation performed.  Discharge Diagnoses:  Active Problems:   Essential hypertension   Non-insulin treated type 2 diabetes mellitus (HCC)    PAD (peripheral artery disease) Rt ABI 0.7, Lt ABI 0.46   Rectal cancer (HCC)   Dehydration   AKI (acute kidney injury) (HCC)   Hypokalemia   Pyuria   Odynophagia   Hyponatremia   Right sided weakness   Oral candidiasis  Dehydration, improving Secondary to dysphagia issues -See below -Continue IV fluid hydration as below now that is been reduced to 75 mL's per hour for 10 more hours  -C/w CLD for now and was advanced to full liquid diet yesterday and soft diet today We will discontinue Maxide at discharge -We will need redilation need to follow-up with PCP and gastroenterology to monitor weight  AKI, improving  Metabolic Acidosis, improving -Secondary to poor oral intake and resultant dehydration as mentioned above. Baseline creatinine of about 1. Creatinine on admission of 4.21 with an associated BUN of 156. Trended down since admission and now BUN/Cr is  20/1.05 -CO2 was 19, and now improved to 26 -Changed IVF to Sodium Bicarbonate 150 mEQ at 75 mL/hr x 10 hours  and now stopped -Strict I's and O's, Daily Weights; patient is +2. 911 L since admission -Avoid nephrotoxic medications, contrast dyes as well as hypotension possible -Repeat CMP as an outpatient  Odynophagia in the setting of Esophagitis and distal esophageal narrowing s/p Dilation  -Speech therapy consulted. Discussed results with SLP who recommends barium swallow -Barium Swallow Results showed extensive esophagitis with small ulcerations of plaque-like lesions extending from the lower cervical esophagus to the lower thoracic esophagus; differentials included Candida esophagitis, cytomegalovirus esophagitis, herpes esophagitis, or glycogenic acanthosis there is also considerable narrowing of the distal esophagus the possibility of the distal esophageal mass is not fully excluded -Gastroenterology is consulted for further evaluation recommendation patient was placed  on IV PPI twice daily and also started on fluconazole  per pharmacy to dose for suspected Candida esophagitis but no mention of this on EGD so we will just treat for 7 days for oral pharyngeal candidiasis -We will have gastroenterology Dr. Collene Mares evaluate for possible EGD and further evaluation to reassess the distal esophagus along with evaluating esophagitis;  -Patient underwent EGD yesterday and showed "Benign-appearing esophageal stenosis. Dilated. Normal stomach. Normal examined duodenum. No specimens collected." -CMV antibody IgG and IgM; IgM was <30.0 and IgG was 9.10 indicating history of CMV and ? Related to Esophagitis  -C/w CLD for now and advancement per GI  and she was advanced to full liquid diet yesterday.  I spoke with Dr. Loletha Carrow of GI who recommend going to soft diet and if patient tolerates this can be discharged home as she is refusing SNF -Follow-up with gastroenterology within 1 to 2 weeks as an outpatient and continue PPI twice daily as well as complete course for oral thrush  Hypokalemia -Significantly low and now improved to  3.5 -Mag Level was low this morning but replete -Continue to Monitor and Replete as Necessary -Repeat CMP in AM   Hyponatremia, improved  -Mild. In setting of poor oral intake and now improved -IV fluids as above and is now 120 -Continue to Monitor and Trend and Repeat CMP in AM  Pyuria -Urinalysis showed hazy appearance, moderate hemoglobin, moderate leukocytes, rare bacteria, 6-10 RBCs per high-power field, and 21-50 WBCs -Unclear if patient was having symptoms but will need to evaluate this further. -Ceftriaxone was started empirically. Urine cultures obtained -Follow urine culture and showed no growth -Discontinued antibiotics given lack of symptoms and fever; WBC is normal  Essential Hypertension -Slightly uncontrolled. On amlodipine, Maxide at home -BP was one 114/60 -Continue Amlodipine 5 mg po Daily  but increased to 10 mg p.o. daily and discontinue Maxide given AKI   Hyperphosphatemia  -> Hypophosphatemia  Hypermagnesemia -> Hypomagnesemia -Patient's phosphorus level is 1 is 1.8 and magnesium level is 1.6 Both were pre-prior to discharge -Continue to Monitor and repeat Mag and Phos Level as an outpatient with PCP  Hypothyroidism -TSH was 0.968 -Continue Levothyroxine 50 mcg po Daily   PAD -Patient previously on Aspirin and Plavix. Per chart review, did not tolerate plavix and no longer taking -Continue Aspirin 81 mg po Daily   Rectal Cancer -Follows with Dr. Burr Medico. -Has an Ostomy and was leaking today  -Medical Oncology Consulted for further evaluation and recc's and patient is currently on observation from a rectal cancer standpoint and they are planning for outpatient follow-up for routine monitoring  Diabetes Mellitus Type 2 -Patient is on metformin and glimepiride as an outpatient.  -HbA1c this Visit was 6.7 -Continue SSI Sensitive Scale  -Patient may benefit from medication adjustment on discharge (discontinuing glimepiride) secondary to age -CBG's ranging from  102-to 38 -Continue to Adjust Insulin as Necessary   Right sided weakness History of CVA -CT head unremarkable -C/w Aspirin 81 mg po Daily and Atorvastatin 10 mg po Daily   Normocytic Anemia -Patient's Hgb/Hct went from 11.6/34.3 -> 10.4/31.2 -> 9.9/30.2 -> 9.8/30.9 -> 9.4/30.1 -> 8.5/27.0 and likely dilutional drop as patient was getting IV fluids significantly -C/w Iron Polysaccharides 150 mg po BID -Checked Anemia Panel and showed an iron level of 32, U IBC of 257, TIBC of 289, saturation ratios of 11%, ferritin level of 229, folate level 9.2, and vitamin B12 level 574 -Check FOBT if concern for bleeding outpatient -Continue to Monitor  for S/Sx of Bleeding; Currently no overt bleeding noted -Repeat CBC in AM   Discharge Instructions  Discharge Instructions    Call MD for:  difficulty breathing, headache or visual disturbances   Complete by: As directed    Call MD for:  extreme  fatigue   Complete by: As directed    Call MD for:  hives   Complete by: As directed    Call MD for:  persistant dizziness or light-headedness   Complete by: As directed    Call MD for:  persistant nausea and vomiting   Complete by: As directed    Call MD for:  redness, tenderness, or signs of infection (pain, swelling, redness, odor or green/yellow discharge around incision site)   Complete by: As directed    Call MD for:  severe uncontrolled pain   Complete by: As directed    Call MD for:  temperature >100.4   Complete by: As directed    Diet - low sodium heart healthy   Complete by: As directed    SOFT DIET   Diet Carb Modified   Complete by: As directed    SOFT DIET   Discharge instructions   Complete by: As directed    You were cared for by a hospitalist during your hospital stay. If you have any questions about your discharge medications or the care you received while you were in the hospital after you are discharged, you can call the unit and ask to speak with the hospitalist on call if the hospitalist that took care of you is not available. Once you are discharged, your primary care physician will handle any further medical issues. Please note that NO REFILLS for any discharge medications will be authorized once you are discharged, as it is imperative that you return to your primary care physician (or establish a relationship with a primary care physician if you do not have one) for your aftercare needs so that they can reassess your need for medications and monitor your lab values.  Follow up with PCP and Gastroenterology as an outpatient and repeat EGD in 2 weeks. Take all medications as prescribed. If symptoms change or worsen please return to the ED for evaluation   Increase activity slowly   Complete by: As directed      Allergies as of 10/06/2018      Reactions   Kiwi Extract Itching   Throat itching    Lisinopril    Bruising   Plavix [clopidogrel]    "feels bad,  doesn't agree with my system"      Medication List    STOP taking these medications   clopidogrel 75 MG tablet Commonly known as: PLAVIX   prochlorperazine 10 MG tablet Commonly known as: COMPAZINE   triamterene-hydrochlorothiazide 75-50 MG tablet Commonly known as: MAXZIDE     TAKE these medications   acetaminophen 500 MG tablet Commonly known as: TYLENOL Take 500 mg by mouth 2 (two) times daily as needed for moderate pain or headache.   albuterol 108 (90 Base) MCG/ACT inhaler Commonly known as: VENTOLIN HFA Inhale 2 puffs into the lungs every 6 (six) hours as needed for wheezing or shortness of breath.   amLODipine 10 MG tablet Commonly known as: NORVASC Take 1 tablet (10 mg total) by mouth daily. What changed:   medication strength  how much to take   ASPERCREME EX Apply 1 application topically daily as needed (joint pain).   aspirin 81 MG tablet Take 81 mg by mouth  daily.   atorvastatin 10 MG tablet Commonly known as: LIPITOR Take 1 tablet (10 mg total) by mouth daily.   CALCIUM 600+D PO Take 1 tablet by mouth daily.   cetirizine 10 MG tablet Commonly known as: ZYRTEC Take 10 mg by mouth daily.   cholecalciferol 1000 units tablet Commonly known as: VITAMIN D Take 4,000 Units by mouth daily.   Ferrex 150 150 MG capsule Generic drug: iron polysaccharides Take 150 mg by mouth 2 (two) times daily.   fluconazole 100 MG tablet Commonly known as: DIFLUCAN Take 1 tablet (100 mg total) by mouth daily. Start taking on: October 07, 2018   gabapentin 300 MG capsule Commonly known as: NEURONTIN Take 300 mg by mouth 2 (two) times daily.   glimepiride 1 MG tablet Commonly known as: AMARYL Take 1 mg by mouth 2 (two) times daily.   levothyroxine 50 MCG tablet Commonly known as: SYNTHROID Take 1 tablet (50 mcg total) by mouth daily.   lidocaine-prilocaine cream Commonly known as: EMLA Apply to affected area once   magic mouthwash Soln Take 5 mLs by  mouth 3 (three) times daily as needed for mouth pain.   metFORMIN 500 MG 24 hr tablet Commonly known as: GLUCOPHAGE-XR TAKE 2 TABLETS BY MOUTH  TWICE A DAY What changed:   when to take this  additional instructions   mirtazapine 7.5 MG tablet Commonly known as: REMERON Take 2 tablets (15 mg total) by mouth at bedtime. What changed:   when to take this  reasons to take this   multivitamin capsule Take 1 capsule by mouth daily.   NASACORT ALLERGY 24HR NA Place 1 spray into the nose daily as needed (allergies).   ondansetron 8 MG tablet Commonly known as: Zofran Take 1 tablet (8 mg total) by mouth 2 (two) times daily as needed for refractory nausea / vomiting. Start on day 3 after chemotherapy. What changed: Another medication with the same name was added. Make sure you understand how and when to take each.   ondansetron 4 MG tablet Commonly known as: ZOFRAN Take 1 tablet (4 mg total) by mouth every 6 (six) hours as needed for nausea. What changed: You were already taking a medication with the same name, and this prescription was added. Make sure you understand how and when to take each.   Osteo Bi-Flex Adv Triple St Tabs Take 1 tablet by mouth 2 (two) times daily.   pantoprazole 40 MG tablet Commonly known as: Protonix Take 1 tablet (40 mg total) by mouth daily.   polycarbophil 625 MG tablet Commonly known as: FIBERCON Take 1 tablet (625 mg total) by mouth 3 (three) times daily.   tetrahydrozoline-zinc 0.05-0.25 % ophthalmic solution Commonly known as: VISINE-AC Place 1 drop into both eyes 2 (two) times daily as needed (dry eyes).   thiamine 100 MG tablet Take 1 tablet (100 mg total) by mouth daily. Start taking on: October 07, 2018   vitamin B-12 500 MCG tablet Commonly known as: CYANOCOBALAMIN Take 500 mcg by mouth daily.            Durable Medical Equipment  (From admission, onward)         Start     Ordered   10/06/18 1515  For home use only  DME 4 wheeled rolling walker with seat  Once    Question:  Patient needs a walker to treat with the following condition  Answer:  Generalized weakness   10/06/18 1514   10/06/18 1253  For  home use only DME Walker rolling  Once    Question:  Patient needs a walker to treat with the following condition  Answer:  Generalized weakness   10/06/18 1253         Follow-up Information    [facility] Follow up.   Specialty: Home Health Services Why: agency will provide home health physical therapy, agency will call you to schedule first visit. Contact information: [place] [address] [phone]        Reynold Bowen, MD. Call.   Specialty: Endocrinology Why: Follow up within 1 week  Contact information: [place] [address] [phone]        Lorretta Harp, MD .   Specialties: Cardiology, Radiology Contact information: [address] [place] [address] [phone]        Juanita Craver, MD. Call.   Specialty: Gastroenterology Why: Follow up within 1-2 weeks and repeat Endoscopy Contact information: [address], [place] [address] [phone]          Allergies  Allergen Reactions  . Kiwi Extract Itching    Throat itching   . Lisinopril     Bruising   . Plavix [Clopidogrel]     "feels bad, doesn't agree with my system"   Consultations:  Gastroenterology  Medical Oncology  Procedures/Studies: Ct Head Wo Contrast  Result Date: 10/01/2018 CLINICAL DATA:  Failure to thrive EXAM: CT HEAD WITHOUT CONTRAST TECHNIQUE: Contiguous axial images were obtained from the base of the skull through the vertex without intravenous contrast. COMPARISON:  11/05/2013 FINDINGS: Brain: Old right basal ganglia lacunar infarct. There is atrophy and chronic small vessel disease changes. No acute intracranial abnormality. Specifically, no hemorrhage, hydrocephalus, mass lesion,  acute infarction, or significant intracranial injury. Vascular: No hyperdense vessel or unexpected calcification. Skull: No acute calvarial abnormality. Sinuses/Orbits: Visualized paranasal sinuses and mastoids clear. Orbital soft tissues unremarkable. Other: None IMPRESSION: Old right basal ganglia lacunar infarct. Atrophy, chronic microvascular disease. No acute intracranial abnormality. Electronically Signed   By: Rolm Baptise M.D.   On: 10/01/2018 23:24   US Renal  Result Date: 10/02/2018 CLINICAL DATA:  Acute renal injury. EXAM: RENAL / URINARY TRACT ULTRASOUND COMPLETE COMPARISON:  Abdomen series 10/01/2018.  CT 01/17/2018. FINDINGS: Right Kidney: Renal measurements: 8.2 x 4.3 x 4.5 cm = volume: 83.6 mL . Echogenicity within normal limits. No mass or hydronephrosis visualized. Left Kidney: Renal measurements: 9.8 x 3.8 x 4.1 cm = volume: 79.1 mL. Echogenicity within normal limits. No mass or hydronephrosis visualized. Bladder: Appears normal for degree of bladder distention. IMPRESSION: No acute or focal abnormality identified. Electronically Signed   By: Marcello Moores  Register   On: 10/02/2018 09:45   Dg Abd Acute 2+v W 1v Chest  Result Date: 10/01/2018 CLINICAL DATA:  Cough, abdominal distention EXAM: DG ABDOMEN ACUTE W/ 1V CHEST COMPARISON:  11/22/2017 chest x-ray.  Abdominal film 08/28/2018 FINDINGS: Right Port-A-Cath in place with the tip in the SVC, unchanged. Lungs clear. Heart is normal size. No effusions. Nonobstructive bowel gas pattern. No organomegaly, free air or suspicious calcification. Vascular calcifications noted. No acute bony abnormality. IMPRESSION: No acute cardiopulmonary disease. No evidence of bowel obstruction or free air. Electronically Signed   By: Rolm Baptise M.D.   On: 10/01/2018 21:09   Dg Esophagus W Double Cm (hd)  Result Date: 10/03/2018 CLINICAL DATA:  Odynophagia. History of colon cancer with lower anterior resection. EXAM: ESOPHOGRAM/BARIUM SWALLOW TECHNIQUE:  Combined  double contrast and single contrast examination performed using effervescent crystals, thick barium liquid, and thin barium liquid. FLUOROSCOPY TIME:  Fluoroscopy Time:  2 minutes, 12 seconds Radiation Exposure Index (if provided by the fluoroscopic device): 9.7 mGy Number of Acquired Spot Images: 0 COMPARISON:  CT chest from 10/25/2017 FINDINGS: The patient is somewhat infirm and slow to move. I judged that with minor assistance she would be able to stand for example for the pharyngeal phase images and for double contrast images, but she did not have ideal mobility for the exam, and also refused to swallow the barium pill. We performed LPO rather than RAO images as I was skeptical she would be able to comfortably lay on her belly. On the pharyngeal phase of swallowing the patient retained barium in the mouth and only swallowed boluses of the oral barium at a time. There is an episode of laryngeal penetration, and also some mild vallecular stasis Starting in the lowest portion of the cervical esophagus but primarily involving the thoracic esophagus, we demonstrate shaggy nodular irregularity of the esophageal wall for example as shown on image 3/4. This has an appearance suggesting small marginal ulcerations and plaque-like lesions. There some sparing of the distal 25% of the thoracic esophagus. Nodularity in the distal esophagus is observed for example on image 33/8, which could be from polyp, ulceration, or a small mass. The filling defect measures about 7 by 5 mm on image 32/8. However, this was somewhat transient. Because the patient took small swallows and possibly due to intrinsic disease, the distal esophagus and gastroesophageal junction remain narrow. I was only able to distend these regions up to about 0.5 cm. Because the patient refused the barium pill I was unable to challenge the region with a larger non liquid bolus. The primary peristaltic waves in the esophagus were not disrupted.  IMPRESSION: 1. Extensive esophagitis with small ulcerations and plaque-like lesions extending from the lower cervical esophagus to the lower thoracic esophagus. Top differential diagnostic considerations for this distribution and appearance include Candida esophagitis, cytomegalovirus esophagitis, herpes esophagitis, or glycogenic acanthosis. 2. Considerable narrowing of the distal esophagus. On series 8 there was a transient filling defect in this vicinity which may been incidental but could be from a varix or possibly even a mass. Because of the relatively small boluses the patient was able to swallow, I am not confident that I was able to fully fluid distend the distal esophagus, the patient was unable to swallow a barium pill. The possibility of a distal esophageal mass is not totally excluded, and after treatment for the patient's presumed esophagitis, consider either endoscopy or repeat esophagram to reassess the distal esophagus. Electronically Signed   By: Van Clines M.D.   On: 10/03/2018 10:13    EGD Findings:      One benign-appearing, intrinsic severe stenosis was found 20 cm from the       incisors. This stenosis measured 5 mm (inner diameter) x 1 cm (in       length). The stenosis was traversed after downsizing scope. A guidewire       was placed and the scope was withdrawn. Dilation was performed with a       Savary dilator with no resistance at 8 mm. The dilation site was       examined following endoscope reinsertion and showed complete resolution       of luminal narrowing. Estimated blood loss was minimal.      The stomach was normal.  The examined duodenum was normal.      A severe benign intrinsic stenosis was found at 20 cm. The adult       endoscope was not able to pass through. The ultraslim upper endoscope       was employed with with some maneuvering the endoscope passed through the       stenosis. The entire esophagus appeared to have evidence of scarring. It        is not clear if this was solely from a GERD esophagitis or another       etiology. The rest of the upper examination was normal. Over a guidewire       the esophagus was dilated using the following Savary dilators: 5 mm, 6       mm, 7 mm, and then the 8 mm. The appropriate mucosal disruption was       achieved and there was no evidence of crepitus during or after the       procedure. Impression:               - Benign-appearing esophageal stenosis. Dilated.                           - Normal stomach.                           - Normal examined duodenum.                           - No specimens collected.  Subjective: Seen and examined at bedside and was doing well tolerated her diet.  Did not want to go significantly more and work with therapy again and want a rolling walker.  She denies any chest pain, and is raising and tolerating her food without issues.  Feels better and wanted to go home and will need to follow-up with PCP and gastroenterology as well as medical oncology in outpatient setting she understands and agrees with the plan of care.  Discharge Exam: Vitals:   10/05/18 2122 10/06/18 0602  BP: 128/62 114/60  Pulse: 74 77  Resp: 17 16  Temp: 98.4 F (36.9 C) 98.9 F (37.2 C)  SpO2: 100% 99%   Vitals:   10/05/18 0412 10/05/18 1311 10/05/18 2122 10/06/18 0602  BP: 130/67 133/78 128/62 114/60  Pulse: 79 88 74 77  Resp: 19 20 17 16   Temp: 98.8 F (37.1 C) 98.3 F (36.8 C) 98.4 F (36.9 C) 98.9 F (37.2 C)  TempSrc:  Oral Oral Oral  SpO2: 100%  100% 99%  Weight:      Height:       General: Pt is alert, awake, not in acute distress Cardiovascular: RRR, S1/S2 +, no rubs, no gallops Respiratory: Diminished bilaterally, no wheezing, no rhonchi Abdominal: Soft, NT, ND, bowel sounds +; has an ostomy bag with dark liquidy stool Extremities: no edema, no cyanosis  The results of significant diagnostics from this hospitalization (including imaging, microbiology,  ancillary and laboratory) are listed below for reference.     Microbiology: Recent Results (from the past 240 hour(s))  Urine culture     Status: None   Collection Time: 10/01/18  7:37 PM   Specimen: Urine, Clean Catch  Result Value Ref Range Status   Specimen Description   Final    URINE, CLEAN CATCH Performed at [facility], [address] [place], [place] [address]    Special Requests   Final    NONE Performed at [facility], [place] [address] [place], [place] [address]    Culture   Final    NO GROWTH Performed at [facility], [place] [address] [place], [place] [address]    Report Status 10/02/2018 FINAL  Final  SARS Coronavirus 2 [facility] order, Performed in [facility] hospital lab) Nasopharyngeal Nasopharyngeal Swab     Status: None   Collection Time: 10/04/18  8:49 AM   Specimen: Nasopharyngeal Swab  Result Value Ref Range Status   SARS Coronavirus 2 NEGATIVE NEGATIVE Final    Comment: (NOTE) If result is NEGATIVE SARS-CoV-2 target nucleic acids are NOT DETECTED. The SARS-CoV-2 RNA is generally detectable in upper and lower  respiratory specimens during the acute phase of infection. The lowest  concentration of SARS-CoV-2 viral copies this assay can detect is 250  copies / mL. A negative result does not preclude SARS-CoV-2 infection  and should not be used as the sole basis for treatment or other  patient management decisions.  A negative result may occur with  improper specimen collection / handling, submission of specimen other  than nasopharyngeal swab, presence of viral mutation(s) within the  areas targeted by this assay, and inadequate number of viral copies  (<250 copies / mL). A negative result must be combined with clinical  observations, patient history, and epidemiological information. If result is POSITIVE SARS-CoV-2 target nucleic acids are DETECTED. The SARS-CoV-2 RNA is generally detectable in upper and  lower  respiratory specimens dur ing the acute phase of infection.  Positive  results are indicative of active infection with SARS-CoV-2.  Clinical  correlation with patient history and other diagnostic information is  necessary to determine patient infection status.  Positive results do  not rule out bacterial infection or co-infection with other viruses. If result is PRESUMPTIVE POSTIVE SARS-CoV-2 nucleic acids MAY BE PRESENT.   A presumptive positive result was obtained on the submitted specimen  and confirmed on repeat testing.  While 2019 novel coronavirus  (SARS-CoV-2) nucleic acids may be present in the submitted sample  additional confirmatory testing may be necessary for epidemiological  and / or clinical management purposes  to differentiate between  SARS-CoV-2 and other Sarbecovirus currently known to infect humans.  If clinically indicated additional testing with an alternate test  methodology [phone]) is advised. The SARS-CoV-2 RNA is generally  detectable in upper and lower respiratory sp ecimens during the acute  phase of infection. The expected result is Negative. Fact Sheet for Patients:  StrictlyIdeas.no Fact Sheet for Healthcare Providers: BankingDealers.co.za This test is not yet approved or cleared by the [place] FDA and has been authorized for detection and/or diagnosis of SARS-CoV-2 by FDA under an Emergency Use Authorization (EUA).  This EUA will remain in effect (meaning this test can be used) for the duration of the COVID-19 declaration under Section 564(b)(1) of the Act, 21 U.S.C. section 360bbb-3(b)(1), unless the authorization is terminated or revoked sooner. Performed at [facility], [place] [address] [place], [place] [address]      Labs: BNP (last 3 results) No results for input(s): BNP in the last 8760 hours. Basic Metabolic Panel: Recent Labs  Lab 10/02/18 0409  10/02/18 1418 10/03/18 0530 10/04/18 0419 10/05/18 0440 10/06/18 0350  NA 132*  --  137 142 137 140  K 2.3* 2.7* 2.9* 5.1 4.0 3.5  CL 95*  --  107 113* 110 107  CO2 23  --  23 21* 19* 26  GLUCOSE 213*  --  128* 157* 180* 96  BUN 140*  --  112* 65* 39* 20  CREATININE 3.16*  --  1.77* 1.48* 1.27* 1.05*  CALCIUM 9.7  --  9.5 9.3 9.1 8.7*  MG 2.6*  --  2.3 2.1 1.7 1.6*  PHOS 4.9*  --  2.5 2.2* 2.0* 1.8*   Liver Function Tests: Recent Labs  Lab 10/02/18 0409 10/03/18 0530 10/04/18 0419 10/05/18 0440 10/06/18 0350  AST 23 31 28 30 21   ALT 14 16 16 18 13   ALKPHOS 77 77 76 92 74  BILITOT 0.4 0.4 0.5 0.4 0.6  PROT 7.1 6.5 6.4* 6.4* 5.3*  ALBUMIN 3.8 3.3* 3.1* 3.1* 2.7*   No results for input(s): LIPASE, AMYLASE in the last 168 hours. No results for input(s): AMMONIA in the last 168 hours. CBC: Recent Labs  Lab 10/01/18 2034 10/02/18 0409 10/03/18 0530 10/04/18 0419 10/05/18 0440 10/06/18 0350  WBC 5.2 5.0 4.2 4.2 4.3 3.2*  NEUTROABS 4.4  --   --  3.0 3.0 2.2  HGB 11.6* 10.4* 9.9* 9.8* 9.4* 8.5*  HCT 34.3* 31.2* 30.2* 30.9* 30.1* 27.0*  MCV 86.0 86.4 89.3 92.5 95.0 93.1  PLT 348 344 303 288 261 253   Cardiac Enzymes: Recent Labs  Lab 10/01/18 2330  CKTOTAL 132   BNP: Invalid input(s): POCBNP CBG: Recent Labs  Lab 10/05/18 1939 10/05/18 2334 10/06/18 0424 10/06/18 0743 10/06/18 1143  GLUCAP 141* 279* 102* 143* 132*   D-Dimer No results for input(s): DDIMER in the last 72 hours. Hgb A1c No results for input(s): HGBA1C in the last 72 hours. Lipid Profile No results for input(s): CHOL, HDL, LDLCALC, TRIG, CHOLHDL, LDLDIRECT in the last 72 hours. Thyroid function studies No results for input(s): TSH, T4TOTAL, T3FREE, THYROIDAB in the last 72 hours.  Invalid input(s): FREET3 Anemia work up Recent Labs    10/04/18 0419  VITAMINB12 574  FOLATE 9.2  FERRITIN 229  TIBC 289  IRON 32  RETICCTPCT 1.6   Urinalysis    Component Value Date/Time    COLORURINE YELLOW 10/01/2018 1936   APPEARANCEUR HAZY (A) 10/01/2018 1936   LABSPEC 1.016 10/01/2018 1936   PHURINE 5.0 10/01/2018 1936   GLUCOSEU NEGATIVE 10/01/2018 1936   HGBUR MODERATE (A) 10/01/2018 1936   BILIRUBINUR NEGATIVE 10/01/2018 1936   BILIRUBINUR negative 11/27/2014 1052   KETONESUR NEGATIVE 10/01/2018 1936   PROTEINUR NEGATIVE 10/01/2018 1936   UROBILINOGEN 0.2 11/27/2014 1052   NITRITE NEGATIVE 10/01/2018 1936   LEUKOCYTESUR MODERATE (A) 10/01/2018 1936   Sepsis Labs Invalid input(s): PROCALCITONIN,  WBC,  LACTICIDVEN Microbiology Recent Results (from the past 240 hour(s))  Urine culture     Status: None   Collection Time: 10/01/18  7:37 PM   Specimen: Urine, Clean Catch  Result Value Ref Range Status   Specimen Description   Final    URINE, CLEAN CATCH Performed at [facility], [place] [address] [place], [place] [address]    Special Requests   Final    NONE Performed at [facility], [place] [address] [place], [place] [address]    Culture   Final    NO GROWTH Performed at [facility], [place] [address] [place],  [address]    Report Status 10/02/2018 FINAL  Final  SARS Coronavirus 2 [facility] order, Performed in [facility] hospital lab) Nasopharyngeal Nasopharyngeal Swab  Status: None   Collection Time: 10/04/18  8:49 AM   Specimen: Nasopharyngeal Swab  Result Value Ref Range Status   SARS Coronavirus 2 NEGATIVE NEGATIVE Final    Comment: (NOTE) If result is NEGATIVE SARS-CoV-2 target nucleic acids are NOT DETECTED. The SARS-CoV-2 RNA is generally detectable in upper and lower  respiratory specimens during the acute phase of infection. The lowest  concentration of SARS-CoV-2 viral copies this assay can detect is 250  copies / mL. A negative result does not preclude SARS-CoV-2 infection  and should not be used as the sole basis for treatment or other  patient management decisions.  A negative result may  occur with  improper specimen collection / handling, submission of specimen other  than nasopharyngeal swab, presence of viral mutation(s) within the  areas targeted by this assay, and inadequate number of viral copies  (<250 copies / mL). A negative result must be combined with clinical  observations, patient history, and epidemiological information. If result is POSITIVE SARS-CoV-2 target nucleic acids are DETECTED. The SARS-CoV-2 RNA is generally detectable in upper and lower  respiratory specimens dur ing the acute phase of infection.  Positive  results are indicative of active infection with SARS-CoV-2.  Clinical  correlation with patient history and other diagnostic information is  necessary to determine patient infection status.  Positive results do  not rule out bacterial infection or co-infection with other viruses. If result is PRESUMPTIVE POSTIVE SARS-CoV-2 nucleic acids MAY BE PRESENT.   A presumptive positive result was obtained on the submitted specimen  and confirmed on repeat testing.  While 2019 novel coronavirus  (SARS-CoV-2) nucleic acids may be present in the submitted sample  additional confirmatory testing may be necessary for epidemiological  and / or clinical management purposes  to differentiate between  SARS-CoV-2 and other Sarbecovirus currently known to infect humans.  If clinically indicated additional testing with an alternate test  methodology [phone]) is advised. The SARS-CoV-2 RNA is generally  detectable in upper and lower respiratory sp ecimens during the acute  phase of infection. The expected result is Negative. Fact Sheet for Patients:  StrictlyIdeas.no Fact Sheet for Healthcare Providers: BankingDealers.co.za This test is not yet approved or cleared by the [place] FDA and has been authorized for detection and/or diagnosis of SARS-CoV-2 by FDA under an Emergency Use Authorization (EUA).  This  EUA will remain in effect (meaning this test can be used) for the duration of the COVID-19 declaration under Section 564(b)(1) of the Act, 21 U.S.C. section 360bbb-3(b)(1), unless the authorization is terminated or revoked sooner. Performed at [facility], [place] [address] [place],  [address]    Time coordinating discharge: 35 minutes  SIGNED:  Kerney Elbe, DO [facility] 10/06/2018, 3:36 PM Pager is on [place]  If 7PM-7AM, please contact night-coverage [email or web address] Password [email or web address]

## 2018-10-06 NOTE — Progress Notes (Signed)
Physical Therapy Treatment Patient Details Name: Nicole Sellers MRN: [id number] DOB: 1950/06/06 Today's Date: 10/06/2018    History of Present Illness Nicole Sellers is a 68 y.o. female with medical history significant of Colon CA, anemia, clotting disorder, lower limb ischemia stenting, GERD, HLD, HTN, hypothyroidism, Dm2, tobacco abuse. Pt presented with decreased appetite for the past 3 weeks since she has undergone her surgery for colon cancer has trouble swallowing her food. She also reports generalized fatigue.    PT Comments    Pt in bed c/o "I'm cold".  Pt familiar from prior admits.  Pt declining rec for SNF.  "I'm going home".  Pt stated her son does the grocery shopping and other errands and she also has [place] daughters that help "when needed".  Pt unsteady with cane, also having to hold to furniture.  Introduced Rollator [employer] and she loved it.  General Gait Details: amb well with 4 [place] Rollator walker with increased balance and safety vs her SPC. Tolerated a great distance.   In the home (tight spaces) she will use her cane and furniture.  Out and about, she has agreed to use Rollator.  Follow Up Recommendations  Home health PT(pt declines SNF)     Equipment Recommendations  Other (comment)([employer] Rollator)    Recommendations for Other Services       Precautions / Restrictions Precautions Precautions: Fall    Mobility  Bed Mobility Overal bed mobility: Needs Assistance Bed Mobility: Supine to Sit;Sit to Supine     Supine to sit: Supervision Sit to supine: Supervision   General bed mobility comments: increased time  Transfers Overall transfer level: Needs assistance Equipment used: 4-wheeled walker Transfers: Sit to/from Stand Sit to Stand: Supervision Stand pivot transfers: Supervision       General transfer comment: increased self ability and balance using 4 WW vd SPC.  50% VC's on proper use, safety with turns and locking/unlocking  brakes.  Ambulation/Gait Ambulation/Gait assistance: Supervision;Min guard Gait Distance (Feet): 185 Feet Assistive device: 4-wheeled walker Gait Pattern/deviations: Step-through pattern;Trunk flexed Gait velocity: decreased   General Gait Details: amb well with 4 [place] Rollator walker with increased balance and safety vs her SPC.  In the home (tight spaces) she will use her cane and furniture.  Out and about, she has agreed to use Rollator.   Stairs             Wheelchair Mobility    Modified Rankin (Stroke Patients Only)       Balance                                            Cognition Arousal/Alertness: Awake/alert Behavior During Therapy: WFL for tasks assessed/performed                                   General Comments: Pt AxO x 3 and familiar from prior admits      Exercises      General Comments        Pertinent Vitals/Pain Pain Assessment: Faces Faces Pain Scale: Hurts a little bit Pain Location: L heel Pain Descriptors / Indicators: Sore Pain Intervention(s): Monitored during session;Repositioned    Home Living  Prior Function            PT Goals (current goals can now be found in the care plan section) Progress towards PT goals: Progressing toward goals    Frequency    Min 3X/week      PT Plan Current plan remains appropriate    Co-evaluation              AM-PAC PT "6 Clicks" Mobility   Outcome Measure  Help needed turning from your back to your side while in a flat bed without using bedrails?: None Help needed moving from lying on your back to sitting on the side of a flat bed without using bedrails?: None Help needed moving to and from a bed to a chair (including a wheelchair)?: None Help needed standing up from a chair using your arms (e.g., wheelchair or bedside chair)?: A Little Help needed to walk in hospital room?: A Little Help needed climbing 3-5  steps with a railing? : A Little 6 Click Score: 21    End of Session Equipment Utilized During Treatment: Gait belt Activity Tolerance: Patient tolerated treatment well Patient left: in bed;with bed alarm set;with nursing/sitter in room   PT Visit Diagnosis: Unsteadiness on feet (R26.81);Other abnormalities of gait and mobility (R26.89);Muscle weakness (generalized) (M62.81)     Time: 1856-3149 PT Time Calculation (min) (ACUTE ONLY): 26 min  Charges:  $Gait Training: 8-22 mins $Therapeutic Exercise: 8-22 mins                     Rica Koyanagi  PTA Acute  Rehabilitation Services Pager      [phone] Office      [phone]

## 2018-10-06 NOTE — TOC Progression Note (Signed)
Transition of Care [facility]) - Progression Note    Patient Details  Name: [name] MRN: [id number] Date of Birth: 1950-07-19  [facility] of [facility]) CM/SW Contact  Servando Snare, [place] Phone Number: 10/06/2018, 1:56 PM  Clinical Narrative:    Patient declined SNF. LCSW notified attending. Patient agreeable to [facility].    Expected Discharge Plan: Home/Self Care Barriers to Discharge: Continued Medical Work up  Expected Discharge Plan and Services Expected Discharge Plan: Home/Self Care   Discharge Planning Services: CM Consult   Living arrangements for the past 2 months: Single Family Home                 DME Arranged: N/A DME Agency: NA       HH Arranged: NA HH Agency: NA         Social Determinants of Health (SDOH) Interventions    Readmission Risk Interventions Readmission Risk Prevention Plan 10/02/2018 08/24/2018  Transportation Screening Complete Complete  Medication Review Press photographer) Complete Complete  PCP or Specialist appointment within 3-5 days of discharge Not Complete Not Complete  PCP/Specialist Appt Not Complete comments not ready to dc Not ready for dc  HRI or [place] Complete Complete  SW Recovery Care/Counseling Consult Complete Not Complete  SW Consult Not Complete Comments - NA  Palliative Care Screening Not Applicable Not [place] Not Applicable Not Applicable  Some recent data might be hidden

## 2018-10-06 NOTE — TOC Progression Note (Signed)
Transition of Care [facility]) - Progression Note    Patient Details  Name: Nicole Sellers MRN: [id number] Date of Birth: [birth year]  [facility]) CM/SW Contact  Joaquin Courts, RN Phone Number: 10/06/2018, 2:23 PM  Clinical Narrative:    Patient set up with Alvis Lemmings for Home health PT. [employer] to deliver rolling walker to bedside for home use.   Expected Discharge Plan: [place] Barriers to Discharge: No Barriers Identified  Expected Discharge Plan and Services Expected Discharge Plan: [place]   Discharge Planning Services: CM Consult Post Acute Care Choice: [place] arrangements for the past 2 months: Single Family Home                 DME Arranged: Walker rolling DME Agency: [employer] Date DME Agency Contacted: 10/06/18 Time DME Agency Contacted: [phone] Representative spoke with at DME Agency: [place]: PT [place]: [place] Date [place]: 10/06/18 Time [place]: 4 Representative spoke with at [place]: [place] ([place]) Interventions    Readmission Risk Interventions Readmission Risk Prevention Plan 10/02/2018 08/24/2018  Transportation Screening Complete Complete  Medication Review Press photographer) Complete Complete  PCP or Specialist appointment within 3-5 days of discharge Not Complete Not Complete  PCP/Specialist Appt Not Complete comments not ready to dc Not ready for dc  HRI or [place] Complete Complete  SW Recovery Care/Counseling Consult Complete Not Complete  SW Consult Not Complete Comments - NA  Palliative Care Screening Not Applicable Not [place] Not Applicable Not Applicable  Some recent data might be hidden

## 2018-10-06 NOTE — Progress Notes (Signed)
   Home health agencies that serve [id number].        [place] Quality of Patient Care Rating Patient Survey Summary Rating  [facility] [phone] 4 out of 5 stars 4 out of [place] [phone] 3 out of 5 stars 4 out of [place] [phone] 4  out of 5 stars 3 out of [place] [phone] 4 out of 5 stars 4 out of [place] [phone] 4  out of 5 stars 4 out of [place] [phone] 4 out of 5 stars 4 out of 5 stars  [facility] [place] [phone] 3  out of 5 stars 4 out of [place] [phone] 2  out of 5 stars 3 out of [place] [phone] 3 out of 5 stars 4 out of 5 stars  [facility] ([phone] 4 out of 5 stars Not [place] [phone] 3 out of 5 stars 4 out of [place] [phone] Not Available5 Not Available12  [facility] [phone] 3  out of 5 stars 3 out of [place] [phone] 5 out of 5 stars 4 out of [place] [phone] 3  out of 5 stars 4 out of [place] [phone] 3  out of 5 stars 3 out of [place] [phone] 3  out of 5 stars Not [place] [phone] 4  out of 5 stars 3 out of [place] number Footnote as displayed on [place]  1 This agency provides services under a federal waiver program to non-traditional, chronic long term population.  2 This agency provides services to a special needs population.  3 Not Available.  4 The number of patient episodes for  this measure is too small to report.  5 This measure currently does not have data or provider has been certified/recertified for less than 6 months.  6 The national average for this measure is not provided because of state-to-state differences in data collection.  7 Medicare is not displaying rates for this measure for any home health agency, because of an issue with the data.  8 There were problems with the data and they are being corrected.  9 Zero, or very few, patients met the survey's rules for inclusion. The scores shown, if any, reflect a very small number of surveys and may not accurately tell how an agency is doing.  10 Survey results are based on less than 12 months of data.  11 Fewer than 70 patients completed the survey. Use the scores shown, if any, with caution as the number of surveys may be too low to accurately tell how an agency is doing.  12 No survey results are available for this period.  13 Data suppressed by CMS for one or more quarters.

## 2018-10-08 ENCOUNTER — Inpatient Hospital Stay: Admission: RE | Admit: 2018-10-08 | Payer: Medicare Other | Source: Ambulatory Visit

## 2018-10-26 ENCOUNTER — Telehealth: Payer: Self-pay

## 2018-10-26 ENCOUNTER — Inpatient Hospital Stay: Payer: Medicare Other | Admitting: Hematology

## 2018-10-26 ENCOUNTER — Inpatient Hospital Stay: Payer: Medicare Other

## 2018-10-26 ENCOUNTER — Inpatient Hospital Stay: Payer: Medicare Other | Attending: Hematology

## 2018-10-26 NOTE — Telephone Encounter (Signed)
Called patient to see if she knew about her appointments today for lab/flush/MD. Patient did not answer, so left VM requesting patient call back to reschedule appointments. Called and spoke with patient's son to see if he would be able to get in touch with patient and encourage her to call the main number and speak with a scheduler to reschedule her appointments. He stated he would and denied any other needs at this time.   Scheduling message sent to call patient and reschedule lab/flush/MD visits.

## 2018-10-30 ENCOUNTER — Telehealth: Payer: Self-pay | Admitting: Hematology

## 2018-10-30 NOTE — Telephone Encounter (Signed)
Scheduled appt per 10/19 sch message - pt is aware of appt date and time   

## 2018-11-14 NOTE — Progress Notes (Signed)
[place]   [phone] [phone]   Clinic Follow up Note   Patient Care Team: Reynold Bowen, MD as PCP - General (Endocrinology) Lorretta Harp, MD as PCP - Cardiology (Cardiology) Leighton Ruff, MD as Consulting Physician (General Surgery) Juanita Craver, MD as Consulting Physician (Gastroenterology) Truitt Merle, MD as Consulting Physician (Hematology)  Date of Service:  11/15/2018  CHIEF COMPLAINT: F/u of Rectal cancer  SUMMARY OF ONCOLOGIC HISTORY: Oncology History Overview Note  Cancer Staging Rectal cancer [facility]) Staging form: Colon and Rectum, AJCC 8th Edition - Clinical stage from 10/04/2017: Stage IIIB (cT3, cN1, cM0) - Signed by Truitt Merle, MD on 11/18/2017     Rectal cancer ([place])  10/04/2017 Procedure   Colonoscopy per Dr. Collene Mares 10/04/17 shows a large, non-obstructing horse-shoe shaped mass was found in the recto-sigmoid colon at 10 cm; the mass was partially circumferential involving one-half of the lumen circumference. The mass measured seven cm in length   10/04/2017 Initial Biopsy   Rectum, at 10-17 cm, mass biopsy -invasive adenocarcinoma, moderately differentiated  ICH stains for MLH1, MSH2, MSH6, and PMS2 are intact (normal)   10/04/2017 Cancer Staging   Staging form: Colon and Rectum, AJCC 8th Edition - Clinical stage from 10/04/2017: Stage IIIB (cT3, cN1, cM0) - Signed by Truitt Merle, MD on 11/18/2017   10/25/2017 Imaging   CT CAP IMPRESSION: 1. Irregular eccentric mass within the sigmoid colon. There are at least 3 abnormal appearing lymph node adjacent to the sigmoid colon at the level of the mass concerning for the possibility of local nodal metastatic disease. 2. Multiple pulmonary nodules as described above. These are indeterminate in etiology. Recommend attention on follow-up.   11/06/2017 Initial Diagnosis   Adenocarcinoma of colon ([place])   11/16/2017 Imaging   Pelvic MRI 11/16/17  IMPRESSION: Rectal adenocarcinoma T  stage:   T3 Rectal adenocarcinoma N stage:  N1 Distance from tumor to the anal sphincter is 6.9 cm.   11/29/2017 -  Chemotherapy   totalneoadjuvant chemo FOLFOXq2 weeks x4 months, starting 11/29/17-03/07/18, followed by concurrent chemoRT. Completed 8 cycles of FOLFOX on 03/07/18   01/17/2018 Imaging   CT AP W Contrast 01/17/18 IMPRESSION: 1. Previously seen soft tissue mass involving the left lateral wall the upper rectum is no longer visualized. 2. Decreased size of left perirectal lymph nodes since prior exam. 3. No other sites of metastatic disease within the abdomen or pelvis. 4. Colonic diverticulosis, without radiographic evidence of diverticulitis. 5. Stable small adjacent paraumbilical hernias containing only fat.   04/09/2018 - 05/17/2018 Chemotherapy   Concurrent ChemoRT with Xeloda '1500mg'$  BID on days of radiation M-F starting 04/09/18. Held week of 04/30/18 due to dirrhea. Restarted 05/07/18 at reduced dose of '1000mg'$  in the AM and 1000-'1500mg'$  in the PM. Completed 05/17/18.    04/09/2018 - 05/17/2018 Radiation Therapy   concurrent chemoRT with Dr. Lisbeth Renshaw   04/09/18-05/17/18   08/03/2018 Surgery   -LOWER ANTERIOR RESECTION; DIAGNOSTIC FLEXIBLE SIGMOIDOSCOPY -UMBILICAL HERNIA REPAIR -DIVERTING LOOP ILEOSTOMY by Dr. Marcello Moores and Dr Dema Severin  08/03/18    08/03/2018 Pathology Results   Diagnosis 08/03/18 1. Colon, segmental resection for tumor, rectosigmoid - BENIGN COLORECTAL-TYPE MUCOSA WITH SCATTERED POOLS OF EXTRACELLULAR MUCIN. - ADENOCARCINOMA IS NOT IDENTIFIED. - THERE IS NO EVIDENCE OF CARCINOMA IN 10 OF 10 LYMPH NODES (0/10). - DIVERTICULAR DISEASE. - SEE ONCOLOGY TABLE BELOW. 2. Colon, segmental resection, additional distal margin - BENIGN ULCERATED COLORECTAL-TYPE MUCOSA WITH SCATTERED POOLS OF EXTRACELLULAR MUCIN. - THERE IS NO EVIDENCE OF CARCINOMA  IN 2 OF 2 LYMPH NODES (0/2). 3. Colon, resection margin (donut), distal margin - BENIGN COLORECTAL-TYPE MUCOSA. - THERE IS NO EVIDENCE  OF MALIGNANCY.   10/04/2018 Procedure   EGD By Dr Christel Mormon 10/04/18  IMPRESSION - Benign-appearing esophageal stenosis. Dilated. - Normal stomach. - Normal examined duodenum. - No specimens collected.      CURRENT THERAPY:  Surveillance   INTERVAL HISTORY:  Nicole Sellers is here for a follow up. She presents to the clinic alone. She notes she is doing well lately. She notes she is having issues getting ostomy bags. Her insurance will only cover 20 bags but since bag leak at times she often needs more. She notes her energy level is low. It took her a lot of time to recover from her surgery. She has not seen Dr. Marcello Moores lately. She feels her surgical incision is doing well. She notes having LE swelling after she was released from hospital in 09/2018. She also has knee weakness and not really standing up straight. She notes her eating and appetite is adequate.    REVIEW OF SYSTEMS:   Constitutional: Denies fevers, chills or abnormal weight loss (+) Low energy  Eyes: Denies blurriness of vision Ears, nose, mouth, throat, and face: Denies mucositis or sore throat Respiratory: Denies cough, dyspnea or wheezes Cardiovascular: Denies palpitation, chest discomfort (+) B/l  lower extremity swelling Gastrointestinal:  Denies nausea, heartburn or change in bowel habits Skin: Denies abnormal skin rashes Lymphatics: Denies new lymphadenopathy or easy bruising Neurological:Denies numbness, tingling or new weaknesses Behavioral/Psych: Mood is stable, no new changes  All other systems were reviewed with the patient and are negative.  MEDICAL HISTORY:  Past Medical History:  Diagnosis Date  . Age-related nuclear cataract, right eye   . Allergy   . Anemia   . Arthritis    "joints sometimes" (12/25/2012)  . Clotting disorder ([place])   . Colon cancer ([place])   . Critical lower limb ischemia   . Diverticulosis   . Gangrene of toe, Rt second toe 12/25/2012  . GERD (gastroesophageal reflux disease)   .  High cholesterol   . Hypertension   . Hypothyroidism   . PAD (peripheral artery disease) ([place])   . PAD (peripheral artery disease) ([place])   . Peripheral neuropathy   . Rectal cancer ([place])   . S/P arterial stent, 12/25/13, successful diamondback orbital rotational arthrectomy, PTA using chocolate  balloon and stenting using I DEV stent of long segment calcified high-grade proximal and mid r 12/25/2012  . Tobacco abuse   . Type II diabetes mellitus ([place])     SURGICAL HISTORY: Past Surgical History:  Procedure Laterality Date  . ABDOMINAL AORTAGRAM  12/20/2012   Procedure: ABDOMINAL AORTAGRAM;  Surgeon: Lorretta Harp, MD;  Location: [facility] CATH LAB;  Service: Cardiovascular;;  . ABDOMINAL HYSTERECTOMY  2000  . ANGIOPLASTY / STENTING FEMORAL Right 12/25/2012  . ATHERECTOMY Right 12/25/2012   Procedure: ATHERECTOMY;  Surgeon: Lorretta Harp, MD;  Location: [facility] CATH LAB;  Service: Cardiovascular;  Laterality: Right;  right SFA  . COLONOSCOPY    . DIVERTING ILEOSTOMY N/A 08/03/2018   Procedure: DIVERTING LOOP ILEOSTOMY;  Surgeon: Leighton Ruff, MD;  Location: [facility] ORS;  Service: General;  Laterality: N/A;  . ESOPHAGOGASTRODUODENOSCOPY N/A 10/04/2018   Procedure: ESOPHAGOGASTRODUODENOSCOPY (EGD);  Surgeon: Carol Ada, MD;  Location: [facility] ENDOSCOPY;  Service: Endoscopy;  Laterality: N/A;  . EYE SURGERY Left    cataract  . HERNIA REPAIR    . LOWER EXTREMITY ANGIOGRAM  N/A 12/20/2012   Procedure: LOWER EXTREMITY ANGIOGRAM;  Surgeon: Lorretta Harp, MD;  Location: [facility] CATH LAB;  Service: Cardiovascular;  Laterality: N/A;  . LOWER EXTREMITY ANGIOGRAM N/A 12/25/2012   Procedure: LOWER EXTREMITY ANGIOGRAM;  Surgeon: Lorretta Harp, MD;  Location: [facility] CATH LAB;  Service: Cardiovascular;  Laterality: N/A;  . LOWER EXTREMITY INTERVENTION  05/08/2017  . LOWER EXTREMITY INTERVENTION Bilateral 05/08/2017   Procedure: LOWER EXTREMITY INTERVENTION;  Surgeon: Lorretta Harp, MD;  Location: [place] CV  LAB;  Service: Cardiovascular;  Laterality: Bilateral;  . PERIPHERAL VASCULAR BALLOON ANGIOPLASTY Right 05/08/2017   Procedure: PERIPHERAL VASCULAR BALLOON ANGIOPLASTY;  Surgeon: Lorretta Harp, MD;  Location: [place] CV LAB;  Service: Cardiovascular;  Laterality: Right;  sfa  . PERIPHERAL VASCULAR INTERVENTION Right 05/08/2017   Procedure: PERIPHERAL VASCULAR INTERVENTION;  Surgeon: Lorretta Harp, MD;  Location: [place] CV LAB;  Service: Cardiovascular;  Laterality: Right;  ext iliac  . PORTACATH PLACEMENT Right 11/22/2017   Procedure: INSERTION PORT-A-CATH;  Surgeon: Leighton Ruff, MD;  Location: [facility] ORS;  Service: General;  Laterality: Right;  . SAVORY DILATION N/A 10/04/2018   Procedure: SAVORY DILATION;  Surgeon: Carol Ada, MD;  Location: [facility] ENDOSCOPY;  Service: Endoscopy;  Laterality: N/A;  . TOE SURGERY Right    2n toe   . UMBILICAL HERNIA REPAIR  2000  . UMBILICAL HERNIA REPAIR N/A 08/03/2018   Procedure: UMBILICAL HERNIA REPAIR;  Surgeon: Leighton Ruff, MD;  Location: [facility] ORS;  Service: General;  Laterality: N/A;  . XI ROBOTIC ASSISTED LOWER ANTERIOR RESECTION N/A 08/03/2018   Procedure: XI ROBOTIC ASSISTED LOWER ANTERIOR RESECTION; DIAGNOSTIC FLEXIBLE SIGMOIDOSCOPY; INTRAOPERATIVE ASSESSMENT OF VASCULAR PERFUSION;  Surgeon: Leighton Ruff, MD;  Location: [facility] ORS;  Service: General;  Laterality: N/A;    I have reviewed the social history and family history with the patient and they are unchanged from previous note.  ALLERGIES:  is allergic to kiwi extract; lisinopril; and plavix [clopidogrel].  MEDICATIONS:  Current Outpatient Medications  Medication Sig Dispense Refill  . acetaminophen (TYLENOL) 500 MG tablet Take 500 mg by mouth 2 (two) times daily as needed for moderate pain or headache.     . albuterol (PROVENTIL HFA;VENTOLIN HFA) 108 (90 BASE) MCG/ACT inhaler Inhale 2 puffs into the lungs every 6 (six) hours as needed for wheezing or shortness of breath.    Marland Kitchen  amLODipine (NORVASC) 10 MG tablet Take 1 tablet (10 mg total) by mouth daily. 30 tablet 0  . aspirin 81 MG tablet Take 81 mg by mouth daily.    Marland Kitchen atorvastatin (LIPITOR) 10 MG tablet Take 1 tablet (10 mg total) by mouth daily. 90 tablet 1  . Calcium Carbonate-Vitamin D (CALCIUM 600+D PO) Take 1 tablet by mouth daily.    . cetirizine (ZYRTEC) 10 MG tablet Take 10 mg by mouth daily.    . cholecalciferol (VITAMIN D) 1000 units tablet Take 4,000 Units by mouth daily.     Marland Kitchen FERREX 150 150 MG capsule Take 150 mg by mouth 2 (two) times daily.  11  . fluconazole (DIFLUCAN) 100 MG tablet Take 1 tablet (100 mg total) by mouth daily. 4 tablet 0  . gabapentin (NEURONTIN) 300 MG capsule Take 300 mg by mouth 2 (two) times daily.     Marland Kitchen glimepiride (AMARYL) 1 MG tablet Take 1 mg by mouth 2 (two) times daily.    Marland Kitchen levothyroxine (SYNTHROID, LEVOTHROID) 50 MCG tablet Take 1 tablet (50 mcg total) by mouth daily. 90 tablet 1  .  lidocaine-prilocaine (EMLA) cream Apply to affected area once 30 g 3  . magic mouthwash SOLN Take 5 mLs by mouth 3 (three) times daily as needed for mouth pain. 150 mL 0  . metFORMIN (GLUCOPHAGE-XR) 500 MG 24 hr tablet TAKE 2 TABLETS BY MOUTH  TWICE A DAY (Patient taking differently: Take 1,000 mg by mouth 2 (two) times a day. Give w/food.) 60 tablet 0  . mirtazapine (REMERON) 7.5 MG tablet Take 2 tablets (15 mg total) by mouth at bedtime. (Patient taking differently: Take 15 mg by mouth at bedtime as needed (sleep). ) 60 tablet 2  . Misc Natural Products (OSTEO BI-FLEX ADV TRIPLE ST) TABS Take 1 tablet by mouth 2 (two) times daily.    . Multiple Vitamin (MULTIVITAMIN) capsule Take 1 capsule by mouth daily.    . ondansetron (ZOFRAN) 4 MG tablet Take 1 tablet (4 mg total) by mouth every 6 (six) hours as needed for nausea. 20 tablet 0  . ondansetron (ZOFRAN) 8 MG tablet Take 1 tablet (8 mg total) by mouth 2 (two) times daily as needed for refractory nausea / vomiting. Start on day 3 after  chemotherapy. 30 tablet 1  . pantoprazole (PROTONIX) 40 MG tablet Take 1 tablet (40 mg total) by mouth daily. 60 tablet 0  . polycarbophil (FIBERCON) 625 MG tablet Take 1 tablet (625 mg total) by mouth 3 (three) times daily.    Marland Kitchen tetrahydrozoline-zinc (VISINE-AC) 0.05-0.25 % ophthalmic solution Place 1 drop into both eyes 2 (two) times daily as needed (dry eyes).    . thiamine 100 MG tablet Take 1 tablet (100 mg total) by mouth daily. 30 tablet 0  . Triamcinolone Acetonide (NASACORT ALLERGY 24HR NA) Place 1 spray into the nose daily as needed (allergies).    Loura Pardon Salicylate (ASPERCREME EX) Apply 1 application topically daily as needed (joint pain).    . vitamin B-12 (CYANOCOBALAMIN) 500 MCG tablet Take 500 mcg by mouth daily.    . potassium chloride SA (KLOR-CON) 20 MEQ tablet Take 1 tablet (20 mEq total) by mouth 2 (two) times daily. Take one tablet by mouth 2 times daily for 5 days then once daily for one month. 40 tablet 1   No current facility-administered medications for this visit.    Facility-Administered Medications Ordered in Other Visits  Medication Dose Route Frequency Provider Last Rate Last Dose  . 0.9 %  sodium chloride infusion   Intravenous Continuous Leighton Ruff, MD      . acetaminophen (TYLENOL) tablet 650 mg  650 mg Oral X5Q PRN Leighton Ruff, MD       Or  . acetaminophen (TYLENOL) suppository 650 mg  650 mg Rectal M0Q PRN Leighton Ruff, MD      . diphenhydrAMINE (BENADRYL) 12.5 MG/5ML elixir 12.5 mg  12.5 mg Oral Q7Y PRN Leighton Ruff, MD       Or  . diphenhydrAMINE (BENADRYL) injection 12.5 mg  12.5 mg Intravenous P9J PRN Leighton Ruff, MD      . enoxaparin (LOVENOX) injection 30 mg  30 mg Subcutaneous K93O Leighton Ruff, MD      . heparin lock flush 100 unit/mL  500 Units Intracatheter Once PRN Alla Feeling, NP      . ondansetron (ZOFRAN-ODT) disintegrating tablet 4 mg  4 mg Oral I7T PRN Leighton Ruff, MD       Or  . ondansetron (ZOFRAN) 4 mg in  sodium chloride 0.9 % 50 mL IVPB  4 mg Intravenous I4P PRN Leighton Ruff, MD      .  polycarbophil (FIBERCON) tablet 625 mg  625 mg Oral TID Leighton Ruff, MD      . sodium chloride 0.9 % bolus 1,000 mL  1,000 mL Intravenous Once Leighton Ruff, MD      . sodium chloride flush (NS) 0.9 % injection 10 mL  10 mL Intracatheter Once PRN Alla Feeling, NP        PHYSICAL EXAMINATION: ECOG PERFORMANCE STATUS: 3 - Symptomatic, >50% confined to bed  Vitals:   11/15/18 1456  BP: (!) 131/59  Pulse: 83  Resp: 18  Temp: 98.5 F (36.9 C)  SpO2: 100%   Filed Weights   11/15/18 1456  Weight: 140 lb 4.8 oz (63.6 kg)    GENERAL:alert, no distress and comfortable SKIN: skin color, texture, turgor are normal, no rashes or significant lesions EYES: normal, Conjunctiva are pink and non-injected, sclera clear  NECK: supple, thyroid normal size, non-tender, without nodularity LYMPH:  no palpable lymphadenopathy in the cervical, axillary new prior authorization request & prior recommendation for today something out something else okay 1 moment well I connect you with a customer service advocate will be willing to take a brief survey at the end of the call her letter know how you are doing now sorry if you like to participate in the care of a LUNGS: clear to auscultation and percussion with normal breathing effort HEART: regular rate & rhythm and no murmurs (+) b/l pitting lower extremity edema up to knees calling about ABDOMEN:abdomen soft, non-tender and normal bowel sounds (+) Surgical incision healed well (+) Colotomy clean, with normal stool Musculoskeletal:no cyanosis of digits and no clubbing  NEURO: alert & oriented x 3 with fluent speech, no focal motor/sensory deficits  LABORATORY DATA:  I have reviewed the data as listed CBC Latest Ref Rng & Units 11/15/2018 10/06/2018 10/05/2018  WBC 4.0 - 10.5 K/uL 3.4(L) 3.2(L) 4.3  Hemoglobin 12.0 - 15.0 g/dL 8.2(L) 8.5(L) 9.4(L)  Hematocrit 36.0 - 46.0  % 25.1(L) 27.0(L) 30.1(L)  Platelets 150 - 400 K/uL 335 253 261     CMP Latest Ref Rng & Units 11/15/2018 10/06/2018 10/05/2018  Glucose 70 - 99 mg/dL 86 96 180(H)  BUN 8 - 23 mg/dL 47(H) 20 39(H)  Creatinine 0.44 - 1.00 mg/dL 2.14(H) 1.05(H) 1.27(H)  Sodium 135 - 145 mmol/L 136 140 137  Potassium 3.5 - 5.1 mmol/L 3.0(LL) 3.5 4.0  Chloride 98 - 111 mmol/L 104 107 110  CO2 22 - 32 mmol/L 22 26 19(L)  Calcium 8.9 - 10.3 mg/dL 9.4 8.7(L) 9.1  Total Protein 6.5 - 8.1 g/dL 6.7 5.3(L) 6.4(L)  Total Bilirubin 0.3 - 1.2 mg/dL 0.2(L) 0.6 0.4  Alkaline Phos 38 - 126 U/L 81 74 92  AST 15 - 41 U/L '19 21 30  '$ ALT 0 - 44 U/L '13 13 18      '$ RADIOGRAPHIC STUDIES: I have personally reviewed the radiological images as listed and agreed with the findings in the report. Vas [place] Lower Extremity Venous (dvt)  Result Date: 11/15/2018  Lower Venous Study Indications: Swelling, and Colon cancer.  Comparison Study: No prior study. Performing Technologist: Maudry Mayhew MHA, RDMS, RVT, RDCS  Examination Guidelines: A complete evaluation includes B-mode imaging, spectral Doppler, color Doppler, and power Doppler as needed of all accessible portions of each vessel. Bilateral testing is considered an integral part of a complete examination. Limited examinations for reoccurring indications may be performed as noted.  +---------+---------------+---------+-----------+----------+-----------------+ RIGHT    CompressibilityPhasicitySpontaneityPropertiesThrombus Aging    +---------+---------------+---------+-----------+----------+-----------------+ CFV  Full           Yes      Yes                                    +---------+---------------+---------+-----------+----------+-----------------+ SFJ      Full                                                           +---------+---------------+---------+-----------+----------+-----------------+ FV Prox  Full                                                            +---------+---------------+---------+-----------+----------+-----------------+ FV Mid   Full                                                           +---------+---------------+---------+-----------+----------+-----------------+ FV DistalFull                                                           +---------+---------------+---------+-----------+----------+-----------------+ PFV      Full                                                           +---------+---------------+---------+-----------+----------+-----------------+ POP      None                    No                   Acute             +---------+---------------+---------+-----------+----------+-----------------+ PTV      Full                                                           +---------+---------------+---------+-----------+----------+-----------------+ PERO     None                                         Acute             +---------+---------------+---------+-----------+----------+-----------------+ TPT      Partial  Age Indeterminate +---------+---------------+---------+-----------+----------+-----------------+  +---------+---------------+---------+-----------+----------+--------------+ LEFT     CompressibilityPhasicitySpontaneityPropertiesThrombus Aging +---------+---------------+---------+-----------+----------+--------------+ CFV      Full           Yes      Yes                                 +---------+---------------+---------+-----------+----------+--------------+ SFJ      Full                                                        +---------+---------------+---------+-----------+----------+--------------+ FV Prox  Full                                                        +---------+---------------+---------+-----------+----------+--------------+ FV Mid   Full                                                         +---------+---------------+---------+-----------+----------+--------------+ FV DistalFull                                                        +---------+---------------+---------+-----------+----------+--------------+ PFV      Full                                                        +---------+---------------+---------+-----------+----------+--------------+ POP      Full           Yes      Yes                                 +---------+---------------+---------+-----------+----------+--------------+ PTV      Full                                                        +---------+---------------+---------+-----------+----------+--------------+ PERO     Full                                                        +---------+---------------+---------+-----------+----------+--------------+  Summary: Right: Findings consistent with acute deep vein thrombosis involving the right popliteal vein, and right peroneal veins. Findings consistent with age indeterminate deep vein thrombosis involving the right tibioperoneal trunk. No cystic structure found in  the popliteal fossa. Left:  There is no evidence of deep vein thrombosis in the lower extremity. No cystic structure found in the popliteal fossa.  *See table(s) above for measurements and observations.    Preliminary      ASSESSMENT & PLAN:  Nicole Sellers is a 68 y.o. female with   1. Adenocarcinoma of the rectosigmoid colon, moderately differentiated,cT3N1M0, stage IIIB,MMR normal -Diagnosed in 09/2017.Shehad completeneoadjuvantchemoand radiation, including4 months ofFOLFOXwithUdenyca,followed byconcurrent chemoRT with Xeloda'1500mg'$  BID, which she completedon5/7/20. -She underwent lower anterior resection on 08/03/18. Her surgical pathology findings showed complete response, no residual tumor, or lymph nodes negative. Predict low risk of recurrence in the future. I do not recommend any more chemo.   -S/p surgery she was hospitalized for small bowel obstruction in 08/2018. Then for AKI and dehydration in 09/2018.  -EGD from 10/04/18 was normal.  -Lately she has been recovering better from surgery. She still has low energy and reduced ambulation. She is managing Colostomy well. Colostomy reversal is still an option for her.  -Will continue surveillance. F/u in 2 months with surveillance CT scan  2. Anemia, history of rron deficiency anemia, secondary to #1 -She has developed worsening anemia after surgery -Iron study was normal in late September -Probably anemia of chronic disease -no need for blood transfusion for now   3. Depression -Due to her diagnosis and treatment. -Currently on mirtazapine. -Her mood has been stable.  4. B/l LE edema, right LE DVT   -We will get a Doppler to rule out DVT today  -Her Doppler showed right popliteal and peroneal vein DVT -due to her AKI, she is not a candidate for Lovenox or Xarelto  -I recommend her to be admitted for DVT and AKI management. She declined, wants to go home today, will come back tomorrow to emergency room to be admitted. -I called her son, and explained the situation and my recommendation for hospital admission today, including the risk of PE and sudden death.  He voiced good understanding, he will talk to his mother tonight and arrange tomorrow's transportation for her mother to go to ED   5.HTN, DM -Continue amlodipine andMaxzide. I recommended her to monitor BP at home. We discussed holdingMaxzideif her blood pressurebecomes low due to diarrhea   6.Peripheralneuropathy, G2 -Secondary to DM and prior chemo  -She will continue Gabapentin and B12. -Her numbness of feet and swelling is improving. The numbness of her fingers is stable.   7. AKI  -Was admitted in September for AKI, resolved after hydration -Cr 2.14 today    Plan -f/u in 2 months with lab and CT CAP with contrast (if EGFR>50) a few days before    -I recommend hospital admission today for DVT and AKI management, she declined, but agrees to go to ED tomorrow to be admitted. She understands to go to ED tonight if she develops chest pain or shortness of breath.   No problem-specific Assessment & Plan notes found for this encounter.   Orders Placed This Encounter  Procedures  . CT Abdomen Pelvis W Contrast    No iv contrast if EGFR<50    Standing Status:   Future    Standing Expiration Date:   11/15/2019    Order Specific Question:   If indicated for the ordered procedure, I authorize the administration of contrast media per Radiology protocol    Answer:   Yes    Order Specific Question:   Preferred imaging location?    Answer:   [facility]    Order Specific  Question:   Is Oral Contrast requested for this exam?    Answer:   Yes, Per Radiology protocol    Order Specific Question:   Radiology Contrast Protocol - do NOT remove file path    Answer:   \\[email or web address]  . CT Chest W Contrast    No iv contrast if EGFR<50    Standing Status:   Future    Standing Expiration Date:   11/15/2019    Order Specific Question:   If indicated for the ordered procedure, I authorize the administration of contrast media per Radiology protocol    Answer:   Yes    Order Specific Question:   Preferred imaging location?    Answer:   [facility]    Order Specific Question:   Radiology Contrast Protocol - do NOT remove file path    Answer:   \\[email or web address]   All questions were answered. The patient knows to call the clinic with any problems, questions or concerns. No barriers to learning was detected. I spent 30 minutes counseling the patient face to face. The total time spent in the appointment was 40 minutes and more than 50% was on counseling and review of test results     Truitt Merle, MD 11/15/2018   I, Joslyn Devon, am acting as scribe for Truitt Merle, MD.   I have reviewed  the above documentation for accuracy and completeness, and I agree with the above.

## 2018-11-15 ENCOUNTER — Other Ambulatory Visit: Payer: Self-pay

## 2018-11-15 ENCOUNTER — Ambulatory Visit (HOSPITAL_COMMUNITY)
Admission: RE | Admit: 2018-11-15 | Discharge: 2018-11-15 | Disposition: A | Discharge status: Home / Self Care | Payer: Medicare Other | Source: Ambulatory Visit | Attending: Hematology | Admitting: Hematology

## 2018-11-15 ENCOUNTER — Encounter: Payer: Self-pay | Admitting: Hematology

## 2018-11-15 ENCOUNTER — Telehealth: Payer: Self-pay | Admitting: *Deleted

## 2018-11-15 ENCOUNTER — Inpatient Hospital Stay: Payer: Medicare Other | Attending: Hematology

## 2018-11-15 ENCOUNTER — Inpatient Hospital Stay: Payer: Medicare Other

## 2018-11-15 ENCOUNTER — Inpatient Hospital Stay (HOSPITAL_BASED_OUTPATIENT_CLINIC_OR_DEPARTMENT_OTHER): Payer: Medicare Other | Admitting: Hematology

## 2018-11-15 VITALS — BP 131/59 | HR 83 | Temp 98.5°F | Resp 18 | Ht 66.0 in | Wt 140.3 lb

## 2018-11-15 DIAGNOSIS — K219 Gastro-esophageal reflux disease without esophagitis: Secondary | ICD-10-CM | POA: Diagnosis not present

## 2018-11-15 DIAGNOSIS — F17210 Nicotine dependence, cigarettes, uncomplicated: Secondary | ICD-10-CM | POA: Insufficient documentation

## 2018-11-15 DIAGNOSIS — C20 Malignant neoplasm of rectum: Secondary | ICD-10-CM

## 2018-11-15 DIAGNOSIS — Z86718 Personal history of other venous thrombosis and embolism: Secondary | ICD-10-CM | POA: Insufficient documentation

## 2018-11-15 DIAGNOSIS — D649 Anemia, unspecified: Secondary | ICD-10-CM | POA: Insufficient documentation

## 2018-11-15 DIAGNOSIS — G629 Polyneuropathy, unspecified: Secondary | ICD-10-CM | POA: Insufficient documentation

## 2018-11-15 DIAGNOSIS — E039 Hypothyroidism, unspecified: Secondary | ICD-10-CM | POA: Insufficient documentation

## 2018-11-15 DIAGNOSIS — E876 Hypokalemia: Secondary | ICD-10-CM

## 2018-11-15 DIAGNOSIS — Z86711 Personal history of pulmonary embolism: Secondary | ICD-10-CM | POA: Insufficient documentation

## 2018-11-15 DIAGNOSIS — E1140 Type 2 diabetes mellitus with diabetic neuropathy, unspecified: Secondary | ICD-10-CM | POA: Diagnosis not present

## 2018-11-15 DIAGNOSIS — Z7982 Long term (current) use of aspirin: Secondary | ICD-10-CM | POA: Diagnosis not present

## 2018-11-15 DIAGNOSIS — Z933 Colostomy status: Secondary | ICD-10-CM | POA: Diagnosis not present

## 2018-11-15 DIAGNOSIS — Z79899 Other long term (current) drug therapy: Secondary | ICD-10-CM | POA: Diagnosis not present

## 2018-11-15 DIAGNOSIS — I739 Peripheral vascular disease, unspecified: Secondary | ICD-10-CM | POA: Insufficient documentation

## 2018-11-15 DIAGNOSIS — Z923 Personal history of irradiation: Secondary | ICD-10-CM | POA: Diagnosis not present

## 2018-11-15 DIAGNOSIS — Z9221 Personal history of antineoplastic chemotherapy: Secondary | ICD-10-CM | POA: Insufficient documentation

## 2018-11-15 DIAGNOSIS — R531 Weakness: Secondary | ICD-10-CM | POA: Diagnosis not present

## 2018-11-15 DIAGNOSIS — Z7984 Long term (current) use of oral hypoglycemic drugs: Secondary | ICD-10-CM | POA: Diagnosis not present

## 2018-11-15 DIAGNOSIS — I10 Essential (primary) hypertension: Secondary | ICD-10-CM | POA: Diagnosis not present

## 2018-11-15 DIAGNOSIS — F329 Major depressive disorder, single episode, unspecified: Secondary | ICD-10-CM | POA: Insufficient documentation

## 2018-11-15 DIAGNOSIS — N179 Acute kidney failure, unspecified: Secondary | ICD-10-CM | POA: Diagnosis not present

## 2018-11-15 DIAGNOSIS — E7800 Pure hypercholesterolemia, unspecified: Secondary | ICD-10-CM | POA: Diagnosis not present

## 2018-11-15 LAB — CMP (CANCER CENTER ONLY)
ALT: 13 U/L (ref 0–44)
AST: 19 U/L (ref 15–41)
Albumin: 3.4 g/dL — ABNORMAL LOW (ref 3.5–5.0)
Alkaline Phosphatase: 81 U/L (ref 38–126)
Anion gap: 10 (ref 5–15)
BUN: 47 mg/dL — ABNORMAL HIGH (ref 8–23)
CO2: 22 mmol/L (ref 22–32)
Calcium: 9.4 mg/dL (ref 8.9–10.3)
Chloride: 104 mmol/L (ref 98–111)
Creatinine: 2.14 mg/dL — ABNORMAL HIGH (ref 0.44–1.00)
GFR, Est AFR Am: 26 mL/min — ABNORMAL LOW (ref >60)
GFR, Estimated: 23 mL/min — ABNORMAL LOW (ref >60)
Glucose, Bld: 86 mg/dL (ref 70–99)
Potassium: 3 mmol/L — CL (ref 3.5–5.1)
Sodium: 136 mmol/L (ref 135–145)
Total Bilirubin: 0.2 mg/dL — ABNORMAL LOW (ref 0.3–1.2)
Total Protein: 6.7 g/dL (ref 6.5–8.1)

## 2018-11-15 LAB — CBC WITH DIFFERENTIAL (CANCER CENTER ONLY)
Abs Immature Granulocytes: 0.01 10*3/uL (ref 0.00–0.07)
Basophils Absolute: 0 10*3/uL (ref 0.0–0.1)
Basophils Relative: 1 %
Eosinophils Absolute: 0.2 10*3/uL (ref 0.0–0.5)
Eosinophils Relative: 5 %
HCT: 25.1 % — ABNORMAL LOW (ref 36.0–46.0)
Hemoglobin: 8.2 g/dL — ABNORMAL LOW (ref 12.0–15.0)
Immature Granulocytes: 0 %
Lymphocytes Relative: 16 %
Lymphs Abs: 0.5 10*3/uL — ABNORMAL LOW (ref 0.7–4.0)
MCH: 29.7 pg (ref 26.0–34.0)
MCHC: 32.7 g/dL (ref 30.0–36.0)
MCV: 90.9 fL (ref 80.0–100.0)
Monocytes Absolute: 0.3 10*3/uL (ref 0.1–1.0)
Monocytes Relative: 10 %
Neutro Abs: 2.4 10*3/uL (ref 1.7–7.7)
Neutrophils Relative %: 68 %
Platelet Count: 335 10*3/uL (ref 150–400)
RBC: 2.76 MIL/uL — ABNORMAL LOW (ref 3.87–5.11)
RDW: 15.8 % — ABNORMAL HIGH (ref 11.5–15.5)
WBC Count: 3.4 10*3/uL — ABNORMAL LOW (ref 4.0–10.5)
nRBC: 0 % (ref 0.0–0.2)

## 2018-11-15 MED ORDER — HEPARIN SOD (PORK) LOCK FLUSH 100 UNIT/ML IV SOLN
500.0000 [IU] | Freq: Once | INTRAVENOUS | Status: AC | PRN
  Administered 2018-11-15: 500 [IU]
  Filled 2018-11-15: qty 5

## 2018-11-15 MED ORDER — SODIUM CHLORIDE 0.9% FLUSH
10.0000 mL | Freq: Once | INTRAVENOUS | Status: AC | PRN
  Administered 2018-11-15: 10 mL
  Filled 2018-11-15: qty 10

## 2018-11-15 MED ORDER — POTASSIUM CHLORIDE CRYS ER 20 MEQ PO TBCR: 20.0000 meq | EXTENDED_RELEASE_TABLET | Freq: Two times a day (BID) | ORAL | 1 refills | Status: DC

## 2018-11-15 NOTE — Progress Notes (Signed)
Bilateral lower extremity venous duplex completed. Refer to "CV Proc" under chart review to view preliminary results.  Critical results discussed with Dr. Burr Medico.  11/15/2018 3:57 PM Kelby Aline., MHA, RVT, RDCS, RDMS

## 2018-11-15 NOTE — Telephone Encounter (Signed)
CRITICAL VALUE STICKER  CRITICAL VALUE: K+ = 3.0.  RECEIVER (on-site recipient of call): Ennifer Harston Winston-Spruiell RN, Triage [facility].   DATE & TIME NOTIFIED: 11/15/2018 at 1526.   MESSENGER (representative from lab): [employer].  MD NOTIFIED: Collaborative nurse.  TIME OF NOTIFICATION: 11/15/2018 at 1537.  RESPONSE: None.

## 2018-11-15 NOTE — Progress Notes (Signed)
CRITICAL VALUE STICKER  CRITICAL VALUE:  Potassium 3.0   RECEIVER (on-site recipient of call):  Valda Favia RN  [place] NOTIFIED: 11/15/2018  1600  MESSENGER (representative from lab): [place]oz  MD NOTIFIED: Dr. Burr Medico  TIME OF NOTIFICATION:1600  RESPONSE: Received orders for potassium sent into pharmacy.

## 2018-11-16 ENCOUNTER — Telehealth: Payer: Self-pay | Admitting: Hematology

## 2018-11-16 NOTE — Telephone Encounter (Signed)
Scheduled appt per 11/5 los.  Left a VM of the appt date and time.

## 2018-11-22 ENCOUNTER — Telehealth: Payer: Self-pay

## 2018-11-22 NOTE — Telephone Encounter (Signed)
Left voice message for patient concerning follow up from her visit with Dr. Burr Medico on 11/5.  She had been instructed to go to the ED for possible admission due to poor kidney function and a new DVT.  She had stated she would go the next day but after reviewing the chart she did not.  I asked the patient to call me back to let me know how she is doing.  Also that we will probably need to get her back in [facility] for labs and possible IVF.

## 2018-11-27 ENCOUNTER — Telehealth: Payer: Self-pay | Admitting: *Deleted

## 2018-11-27 NOTE — Telephone Encounter (Signed)
Called & left message at home # to call back.  Called mobile # & unable to leave message b/c mailbox full.  Reached son, Harrell Gave & he states that pt is stubborn & he can't make her go to ED.  He is concerned & wishes she would go.  Asked him to discuss with his mother & have her call us back to set up appt with Dr Lennie Odor & maybe some IVF.  He states he will try to do this.

## 2018-11-27 NOTE — Telephone Encounter (Signed)
-----   Message from Truitt Merle, MD sent at 11/27/2018  8:36 AM EST ----- [name] (or whoever my desk nurse today),  Please see the phone note from 11/12, she has not called back. Please call pt and her son, to see if she is willing to coming in for some IVF, and I or Lacie will talk to her oral medicine Eliquis for her DVT, if she does not want to go to ED. I am worry about her kidney function and untreated DVT  Thanks  [name]

## 2018-11-30 ENCOUNTER — Encounter (HOSPITAL_COMMUNITY): Payer: Medicare Other

## 2018-12-03 ENCOUNTER — Ambulatory Visit (HOSPITAL_COMMUNITY)
Admission: RE | Admit: 2018-12-03 | Payer: Medicare Other | Source: Ambulatory Visit | Attending: Cardiovascular Disease | Admitting: Cardiovascular Disease

## 2018-12-10 ENCOUNTER — Other Ambulatory Visit: Payer: Self-pay | Admitting: Nurse Practitioner

## 2018-12-10 DIAGNOSIS — E039 Hypothyroidism, unspecified: Secondary | ICD-10-CM

## 2018-12-10 DIAGNOSIS — E119 Type 2 diabetes mellitus without complications: Secondary | ICD-10-CM

## 2018-12-11 ENCOUNTER — Other Ambulatory Visit: Payer: Self-pay | Admitting: Hematology

## 2018-12-11 DIAGNOSIS — E876 Hypokalemia: Secondary | ICD-10-CM

## 2018-12-26 ENCOUNTER — Other Ambulatory Visit: Payer: Self-pay | Admitting: Hematology

## 2018-12-26 DIAGNOSIS — E876 Hypokalemia: Secondary | ICD-10-CM

## 2019-01-09 ENCOUNTER — Other Ambulatory Visit: Payer: Self-pay | Admitting: Hematology

## 2019-01-09 DIAGNOSIS — E876 Hypokalemia: Secondary | ICD-10-CM

## 2019-01-14 ENCOUNTER — Inpatient Hospital Stay: Payer: Medicare Other | Attending: Hematology

## 2019-01-14 ENCOUNTER — Inpatient Hospital Stay: Payer: Medicare Other

## 2019-01-16 ENCOUNTER — Inpatient Hospital Stay: Payer: Medicare Other | Admitting: Hematology

## 2019-03-08 ENCOUNTER — Encounter: Payer: Self-pay | Admitting: Cardiovascular Disease

## 2019-03-14 ENCOUNTER — Ambulatory Visit: Payer: Medicare Other | Attending: Internal Medicine

## 2019-03-14 DIAGNOSIS — Z23 Encounter for immunization: Secondary | ICD-10-CM | POA: Insufficient documentation

## 2019-03-14 NOTE — Progress Notes (Signed)
   Covid-19 Vaccination Clinic  Name:  Nicole Sellers    MRN: [id number] DOB: 1950-04-30  03/14/2019  [name] was observed post Covid-19 immunization for 15 minutes without incident. She was provided with Vaccine Information Sheet and instruction to access the V-Safe system.   [name] was instructed to call 911 with any severe reactions post vaccine: Marland Kitchen Difficulty breathing  . Swelling of face and throat  . A fast heartbeat  . A bad rash all over body  . Dizziness and weakness   Immunizations Administered    Name Date Dose VIS Date Route   Pfizer COVID-19 Vaccine 03/14/2019  3:09 PM 0.3 mL 12/21/2018 Intramuscular   Manufacturer: [place]   Lot: [id number]   [place]: [id number]

## 2019-03-15 ENCOUNTER — Other Ambulatory Visit (HOSPITAL_COMMUNITY): Payer: Self-pay | Admitting: *Deleted

## 2019-03-18 ENCOUNTER — Other Ambulatory Visit: Payer: Medicare Other

## 2019-03-18 ENCOUNTER — Inpatient Hospital Stay (HOSPITAL_COMMUNITY): Admission: RE | Admit: 2019-03-18 | Payer: Medicare Other | Source: Ambulatory Visit

## 2019-03-19 ENCOUNTER — Ambulatory Visit (HOSPITAL_COMMUNITY)
Admission: RE | Admit: 2019-03-19 | Payer: Medicare Other | Source: Ambulatory Visit | Attending: Cardiovascular Disease | Admitting: Cardiovascular Disease

## 2019-03-20 ENCOUNTER — Other Ambulatory Visit: Payer: Self-pay | Admitting: Cardiovascular Disease

## 2019-03-20 DIAGNOSIS — I739 Peripheral vascular disease, unspecified: Secondary | ICD-10-CM

## 2019-03-22 ENCOUNTER — Other Ambulatory Visit: Payer: Self-pay | Admitting: Cardiovascular Disease

## 2019-03-22 DIAGNOSIS — I739 Peripheral vascular disease, unspecified: Secondary | ICD-10-CM

## 2019-03-22 DIAGNOSIS — I708 Atherosclerosis of other arteries: Secondary | ICD-10-CM

## 2019-03-25 ENCOUNTER — Ambulatory Visit (HOSPITAL_COMMUNITY)
Admission: RE | Admit: 2019-03-25 | Discharge: 2019-03-25 | Disposition: A | Discharge status: Home / Self Care | Payer: Medicare Other | Source: Ambulatory Visit | Attending: Cardiovascular Disease | Admitting: Cardiovascular Disease

## 2019-03-25 ENCOUNTER — Ambulatory Visit (HOSPITAL_BASED_OUTPATIENT_CLINIC_OR_DEPARTMENT_OTHER)
Admission: RE | Admit: 2019-03-25 | Discharge: 2019-03-25 | Disposition: A | Discharge status: Home / Self Care | Payer: Medicare Other | Source: Ambulatory Visit | Attending: Internal Medicine | Admitting: Internal Medicine

## 2019-03-25 ENCOUNTER — Encounter (HOSPITAL_COMMUNITY): Payer: Medicare Other

## 2019-03-25 ENCOUNTER — Other Ambulatory Visit: Payer: Self-pay

## 2019-03-25 DIAGNOSIS — I708 Atherosclerosis of other arteries: Secondary | ICD-10-CM

## 2019-03-25 DIAGNOSIS — I739 Peripheral vascular disease, unspecified: Secondary | ICD-10-CM | POA: Insufficient documentation

## 2019-03-26 ENCOUNTER — Other Ambulatory Visit (HOSPITAL_COMMUNITY): Payer: Self-pay | Admitting: *Deleted

## 2019-03-27 ENCOUNTER — Ambulatory Visit: Payer: Medicare Other | Admitting: Cardiovascular Disease

## 2019-03-27 ENCOUNTER — Other Ambulatory Visit: Payer: Self-pay

## 2019-03-27 ENCOUNTER — Encounter (HOSPITAL_COMMUNITY)
Admission: RE | Admit: 2019-03-27 | Discharge: 2019-03-27 | Disposition: A | Discharge status: Home / Self Care | Payer: Medicare Other | Source: Ambulatory Visit | Attending: Endocrinology | Admitting: Endocrinology

## 2019-03-27 DIAGNOSIS — I739 Peripheral vascular disease, unspecified: Secondary | ICD-10-CM | POA: Diagnosis not present

## 2019-03-27 DIAGNOSIS — N170 Acute kidney failure with tubular necrosis: Secondary | ICD-10-CM | POA: Diagnosis not present

## 2019-03-27 MED ORDER — SODIUM CHLORIDE 0.9 % IV SOLN
510.0000 mg | INTRAVENOUS | Status: DC
  Administered 2019-03-27: 510 mg via INTRAVENOUS
  Filled 2019-03-27: qty 510

## 2019-03-29 ENCOUNTER — Emergency Department (HOSPITAL_COMMUNITY): Payer: Medicare Other

## 2019-03-29 ENCOUNTER — Encounter (HOSPITAL_COMMUNITY): Payer: Self-pay | Admitting: *Deleted

## 2019-03-29 ENCOUNTER — Other Ambulatory Visit: Payer: Self-pay

## 2019-03-29 ENCOUNTER — Inpatient Hospital Stay (HOSPITAL_COMMUNITY)
Admission: EM | Admit: 2019-03-29 | Discharge: 2019-04-04 | DRG: 683 | Disposition: A | Discharge status: Home Health | Payer: Medicare Other | Attending: Internal Medicine | Admitting: Internal Medicine

## 2019-03-29 DIAGNOSIS — Z8249 Family history of ischemic heart disease and other diseases of the circulatory system: Secondary | ICD-10-CM

## 2019-03-29 DIAGNOSIS — Z823 Family history of stroke: Secondary | ICD-10-CM | POA: Diagnosis not present

## 2019-03-29 DIAGNOSIS — I82409 Acute embolism and thrombosis of unspecified deep veins of unspecified lower extremity: Secondary | ICD-10-CM | POA: Diagnosis not present

## 2019-03-29 DIAGNOSIS — N179 Acute kidney failure, unspecified: Secondary | ICD-10-CM | POA: Diagnosis not present

## 2019-03-29 DIAGNOSIS — I129 Hypertensive chronic kidney disease with stage 1 through stage 4 chronic kidney disease, or unspecified chronic kidney disease: Secondary | ICD-10-CM | POA: Diagnosis present

## 2019-03-29 DIAGNOSIS — Z87891 Personal history of nicotine dependence: Secondary | ICD-10-CM | POA: Diagnosis not present

## 2019-03-29 DIAGNOSIS — N189 Chronic kidney disease, unspecified: Secondary | ICD-10-CM | POA: Diagnosis not present

## 2019-03-29 DIAGNOSIS — I82441 Acute embolism and thrombosis of right tibial vein: Secondary | ICD-10-CM | POA: Diagnosis not present

## 2019-03-29 DIAGNOSIS — C20 Malignant neoplasm of rectum: Secondary | ICD-10-CM | POA: Diagnosis not present

## 2019-03-29 DIAGNOSIS — K219 Gastro-esophageal reflux disease without esophagitis: Secondary | ICD-10-CM | POA: Diagnosis present

## 2019-03-29 DIAGNOSIS — I10 Essential (primary) hypertension: Secondary | ICD-10-CM | POA: Diagnosis not present

## 2019-03-29 DIAGNOSIS — Z8042 Family history of malignant neoplasm of prostate: Secondary | ICD-10-CM | POA: Diagnosis not present

## 2019-03-29 DIAGNOSIS — E1151 Type 2 diabetes mellitus with diabetic peripheral angiopathy without gangrene: Secondary | ICD-10-CM | POA: Diagnosis not present

## 2019-03-29 DIAGNOSIS — E039 Hypothyroidism, unspecified: Secondary | ICD-10-CM | POA: Diagnosis present

## 2019-03-29 DIAGNOSIS — Z515 Encounter for palliative care: Secondary | ICD-10-CM | POA: Diagnosis not present

## 2019-03-29 DIAGNOSIS — Z20822 Contact with and (suspected) exposure to covid-19: Secondary | ICD-10-CM | POA: Diagnosis present

## 2019-03-29 DIAGNOSIS — Z91018 Allergy to other foods: Secondary | ICD-10-CM

## 2019-03-29 DIAGNOSIS — R600 Localized edema: Secondary | ICD-10-CM | POA: Diagnosis not present

## 2019-03-29 DIAGNOSIS — I739 Peripheral vascular disease, unspecified: Secondary | ICD-10-CM | POA: Diagnosis present

## 2019-03-29 DIAGNOSIS — Z833 Family history of diabetes mellitus: Secondary | ICD-10-CM

## 2019-03-29 DIAGNOSIS — N183 Chronic kidney disease, stage 3 unspecified: Secondary | ICD-10-CM | POA: Diagnosis not present

## 2019-03-29 DIAGNOSIS — Z7189 Other specified counseling: Secondary | ICD-10-CM | POA: Diagnosis not present

## 2019-03-29 DIAGNOSIS — Z85038 Personal history of other malignant neoplasm of large intestine: Secondary | ICD-10-CM

## 2019-03-29 DIAGNOSIS — Z66 Do not resuscitate: Secondary | ICD-10-CM | POA: Diagnosis not present

## 2019-03-29 DIAGNOSIS — E119 Type 2 diabetes mellitus without complications: Secondary | ICD-10-CM | POA: Diagnosis not present

## 2019-03-29 DIAGNOSIS — Z85048 Personal history of other malignant neoplasm of rectum, rectosigmoid junction, and anus: Secondary | ICD-10-CM | POA: Diagnosis not present

## 2019-03-29 DIAGNOSIS — E1122 Type 2 diabetes mellitus with diabetic chronic kidney disease: Secondary | ICD-10-CM | POA: Diagnosis not present

## 2019-03-29 DIAGNOSIS — E1136 Type 2 diabetes mellitus with diabetic cataract: Secondary | ICD-10-CM | POA: Diagnosis present

## 2019-03-29 DIAGNOSIS — E1142 Type 2 diabetes mellitus with diabetic polyneuropathy: Secondary | ICD-10-CM | POA: Diagnosis present

## 2019-03-29 DIAGNOSIS — E785 Hyperlipidemia, unspecified: Secondary | ICD-10-CM | POA: Diagnosis not present

## 2019-03-29 DIAGNOSIS — E7800 Pure hypercholesterolemia, unspecified: Secondary | ICD-10-CM | POA: Diagnosis present

## 2019-03-29 DIAGNOSIS — Z79890 Hormone replacement therapy (postmenopausal): Secondary | ICD-10-CM

## 2019-03-29 DIAGNOSIS — Z7982 Long term (current) use of aspirin: Secondary | ICD-10-CM | POA: Diagnosis not present

## 2019-03-29 DIAGNOSIS — Z9221 Personal history of antineoplastic chemotherapy: Secondary | ICD-10-CM

## 2019-03-29 DIAGNOSIS — Z818 Family history of other mental and behavioral disorders: Secondary | ICD-10-CM | POA: Diagnosis not present

## 2019-03-29 DIAGNOSIS — Z7984 Long term (current) use of oral hypoglycemic drugs: Secondary | ICD-10-CM

## 2019-03-29 DIAGNOSIS — I82431 Acute embolism and thrombosis of right popliteal vein: Secondary | ICD-10-CM | POA: Diagnosis present

## 2019-03-29 DIAGNOSIS — N170 Acute kidney failure with tubular necrosis: Principal | ICD-10-CM | POA: Diagnosis present

## 2019-03-29 DIAGNOSIS — H2511 Age-related nuclear cataract, right eye: Secondary | ICD-10-CM | POA: Diagnosis present

## 2019-03-29 DIAGNOSIS — E875 Hyperkalemia: Secondary | ICD-10-CM | POA: Diagnosis not present

## 2019-03-29 DIAGNOSIS — Z932 Ileostomy status: Secondary | ICD-10-CM

## 2019-03-29 DIAGNOSIS — M7989 Other specified soft tissue disorders: Secondary | ICD-10-CM | POA: Diagnosis not present

## 2019-03-29 DIAGNOSIS — I824Y1 Acute embolism and thrombosis of unspecified deep veins of right proximal lower extremity: Secondary | ICD-10-CM | POA: Diagnosis not present

## 2019-03-29 DIAGNOSIS — Z888 Allergy status to other drugs, medicaments and biological substances status: Secondary | ICD-10-CM

## 2019-03-29 DIAGNOSIS — D631 Anemia in chronic kidney disease: Secondary | ICD-10-CM | POA: Diagnosis present

## 2019-03-29 LAB — BASIC METABOLIC PANEL
Anion gap: 12 (ref 5–15)
BUN: 65 mg/dL — ABNORMAL HIGH (ref 8–23)
CO2: 20 mmol/L — ABNORMAL LOW (ref 22–32)
Calcium: 9.6 mg/dL (ref 8.9–10.3)
Chloride: 105 mmol/L (ref 98–111)
Creatinine, Ser: 5.04 mg/dL — ABNORMAL HIGH (ref 0.44–1.00)
GFR calc Af Amer: 9 mL/min — ABNORMAL LOW (ref >60)
GFR calc non Af Amer: 8 mL/min — ABNORMAL LOW (ref >60)
Glucose, Bld: 146 mg/dL — ABNORMAL HIGH (ref 70–99)
Potassium: 5.1 mmol/L (ref 3.5–5.1)
Sodium: 137 mmol/L (ref 135–145)

## 2019-03-29 LAB — CBC
HCT: 25 % — ABNORMAL LOW (ref 36.0–46.0)
Hemoglobin: 7.8 g/dL — ABNORMAL LOW (ref 12.0–15.0)
MCH: 30.6 pg (ref 26.0–34.0)
MCHC: 31.2 g/dL (ref 30.0–36.0)
MCV: 98 fL (ref 80.0–100.0)
Platelets: 338 10*3/uL (ref 150–400)
RBC: 2.55 MIL/uL — ABNORMAL LOW (ref 3.87–5.11)
RDW: 13.4 % (ref 11.5–15.5)
WBC: 3.9 10*3/uL — ABNORMAL LOW (ref 4.0–10.5)
nRBC: 0 % (ref 0.0–0.2)

## 2019-03-29 MED ORDER — HEPARIN BOLUS VIA INFUSION
4000.0000 [IU] | Freq: Once | INTRAVENOUS | Status: AC
  Administered 2019-03-30: 4000 [IU] via INTRAVENOUS
  Filled 2019-03-29: qty 4000

## 2019-03-29 MED ORDER — PANTOPRAZOLE SODIUM 40 MG PO TBEC
40.0000 mg | DELAYED_RELEASE_TABLET | Freq: Every day | ORAL | Status: DC
  Administered 2019-03-30 – 2019-04-04 (×6): 40 mg via ORAL
  Filled 2019-03-29 (×6): qty 1

## 2019-03-29 MED ORDER — HEPARIN SODIUM (PORCINE) 5000 UNIT/ML IJ SOLN: 5000.0000 [IU] | Freq: Three times a day (TID) | INTRAMUSCULAR | Status: DC

## 2019-03-29 MED ORDER — GABAPENTIN 300 MG PO CAPS
300.0000 mg | ORAL_CAPSULE | Freq: Two times a day (BID) | ORAL | Status: DC
  Administered 2019-03-30 – 2019-04-04 (×12): 300 mg via ORAL
  Filled 2019-03-29 (×12): qty 1

## 2019-03-29 MED ORDER — INSULIN ASPART 100 UNIT/ML ~~LOC~~ SOLN: 0.0000 [IU] | Freq: Every day | SUBCUTANEOUS | Status: DC

## 2019-03-29 MED ORDER — ACETAMINOPHEN 650 MG RE SUPP: 650.0000 mg | Freq: Four times a day (QID) | RECTAL | Status: DC | PRN

## 2019-03-29 MED ORDER — ONDANSETRON HCL 4 MG PO TABS: 4.0000 mg | ORAL_TABLET | Freq: Four times a day (QID) | ORAL | Status: DC | PRN

## 2019-03-29 MED ORDER — ONDANSETRON HCL 4 MG/2ML IJ SOLN: 4.0000 mg | Freq: Four times a day (QID) | INTRAMUSCULAR | Status: DC | PRN

## 2019-03-29 MED ORDER — INSULIN ASPART 100 UNIT/ML ~~LOC~~ SOLN
0.0000 [IU] | Freq: Three times a day (TID) | SUBCUTANEOUS | Status: DC
  Administered 2019-03-30 – 2019-04-01 (×4): 2 [IU] via SUBCUTANEOUS
  Administered 2019-04-02: 1 [IU] via SUBCUTANEOUS
  Administered 2019-04-02: 2 [IU] via SUBCUTANEOUS
  Administered 2019-04-03 (×2): 1 [IU] via SUBCUTANEOUS
  Administered 2019-04-04 (×2): 2 [IU] via SUBCUTANEOUS

## 2019-03-29 MED ORDER — SODIUM CHLORIDE 0.9 % IV BOLUS
500.0000 mL | Freq: Once | INTRAVENOUS | Status: AC
  Administered 2019-03-29: 500 mL via INTRAVENOUS

## 2019-03-29 MED ORDER — LEVOTHYROXINE SODIUM 50 MCG PO TABS
50.0000 ug | ORAL_TABLET | Freq: Every day | ORAL | Status: DC
  Administered 2019-03-30 – 2019-04-04 (×6): 50 ug via ORAL
  Filled 2019-03-29 (×6): qty 1

## 2019-03-29 MED ORDER — ASPIRIN 81 MG PO CHEW
81.0000 mg | CHEWABLE_TABLET | Freq: Every day | ORAL | Status: DC
  Administered 2019-03-30 – 2019-04-04 (×6): 81 mg via ORAL
  Filled 2019-03-29 (×6): qty 1

## 2019-03-29 MED ORDER — ACETAMINOPHEN 325 MG PO TABS
650.0000 mg | ORAL_TABLET | Freq: Four times a day (QID) | ORAL | Status: DC | PRN
  Administered 2019-04-01 – 2019-04-04 (×5): 650 mg via ORAL
  Filled 2019-03-29 (×6): qty 2

## 2019-03-29 MED ORDER — POLYSACCHARIDE IRON COMPLEX 150 MG PO CAPS
150.0000 mg | ORAL_CAPSULE | Freq: Two times a day (BID) | ORAL | Status: DC
  Administered 2019-03-30 – 2019-04-04 (×12): 150 mg via ORAL
  Filled 2019-03-29 (×12): qty 1

## 2019-03-29 MED ORDER — HEPARIN (PORCINE) 25000 UT/250ML-% IV SOLN
700.0000 [IU]/h | INTRAVENOUS | Status: DC
  Administered 2019-03-30: 1000 [IU]/h via INTRAVENOUS
  Administered 2019-03-31: 750 [IU]/h via INTRAVENOUS
  Filled 2019-03-29 (×2): qty 250

## 2019-03-29 NOTE — H&P (Signed)
History and Physical    Nicole Sellers FXT:[id number] DOB: 01-Mar-1950 DOA: 03/29/2019  PCP: Reynold Bowen, MD  Patient coming from: Home  I have personally briefly reviewed patient's old medical records in [place]  Chief Complaint: Abnormal lab  HPI: Nicole Sellers is a 69 y.o. female with medical history significant of stage 3 rectal CA dx in 2019 s/p chemo, resection, believed to be in remission as of last office visit 11/15/18.  Looks like they wanted a f/u CT scan in 2 months after that visit which didn't end up happening.  Pt with leg swelling that visit, labs would end up showing AKI at that time with creat of 2.1 up from 1.0 in Sept.  Also had venous duplex of BLE of BLE which would end up showing a distal RLE DVT.  They had wanted her to go to ED for this according to phone notes at the time, but unfortunately it seems she ended up getting lost to follow up and DVT remained untreated as did AKI.  She presents to the ED today after her PCP checked labs on her and found a very elevated creatinine.  The patient is complaining mostly of swelling to her bilateral lower extremities.  This is fairly chronic from notes reviewed on care everywhere.  She states that she has been eating and drinking well.  No recent fevers or illness.  Denies vomiting, bowel changes.  She is not on chronic anticoagulation.   ED Course: Creat 5.0 today up from 2.1 in Nov and 1.0 Sept.  BUN 65.  HGB 7.8  BP 144/64.  No SIRS.  US renal: no obstruction.   Review of Systems: As per HPI, otherwise all review of systems negative.  Past Medical History:  Diagnosis Date  . Age-related nuclear cataract, right eye   . Allergy   . Anemia   . Arthritis    "joints sometimes" (12/25/2012)  . Clotting disorder ([place])   . Colon cancer ([place])   . Critical lower limb ischemia   . Diverticulosis   . Gangrene of toe, Rt second toe 12/25/2012  . GERD (gastroesophageal reflux disease)   . High  cholesterol   . Hypertension   . Hypothyroidism   . PAD (peripheral artery disease) ([place])   . PAD (peripheral artery disease) ([place])   . Peripheral neuropathy   . Rectal cancer ([place])   . S/P arterial stent, 12/25/13, successful diamondback orbital rotational arthrectomy, PTA using chocolate  balloon and stenting using I DEV stent of long segment calcified high-grade proximal and mid r 12/25/2012  . Tobacco abuse   . Type II diabetes mellitus ([place])     Past Surgical History:  Procedure Laterality Date  . ABDOMINAL AORTAGRAM  12/20/2012   Procedure: ABDOMINAL AORTAGRAM;  Surgeon: Lorretta Harp, MD;  Location: [facility] CATH LAB;  Service: Cardiovascular;;  . ABDOMINAL HYSTERECTOMY  2000  . ANGIOPLASTY / STENTING FEMORAL Right 12/25/2012  . ATHERECTOMY Right 12/25/2012   Procedure: ATHERECTOMY;  Surgeon: Lorretta Harp, MD;  Location: [facility] CATH LAB;  Service: Cardiovascular;  Laterality: Right;  right SFA  . COLONOSCOPY    . DIVERTING ILEOSTOMY N/A 08/03/2018   Procedure: DIVERTING LOOP ILEOSTOMY;  Surgeon: Leighton Ruff, MD;  Location: [facility] ORS;  Service: General;  Laterality: N/A;  . ESOPHAGOGASTRODUODENOSCOPY N/A 10/04/2018   Procedure: ESOPHAGOGASTRODUODENOSCOPY (EGD);  Surgeon: Carol Ada, MD;  Location: [facility] ENDOSCOPY;  Service: Endoscopy;  Laterality: N/A;  . EYE SURGERY Left    cataract  .  HERNIA REPAIR    . LOWER EXTREMITY ANGIOGRAM N/A 12/20/2012   Procedure: LOWER EXTREMITY ANGIOGRAM;  Surgeon: Lorretta Harp, MD;  Location: [facility] CATH LAB;  Service: Cardiovascular;  Laterality: N/A;  . LOWER EXTREMITY ANGIOGRAM N/A 12/25/2012   Procedure: LOWER EXTREMITY ANGIOGRAM;  Surgeon: Lorretta Harp, MD;  Location: [facility] CATH LAB;  Service: Cardiovascular;  Laterality: N/A;  . LOWER EXTREMITY INTERVENTION  05/08/2017  . LOWER EXTREMITY INTERVENTION Bilateral 05/08/2017   Procedure: LOWER EXTREMITY INTERVENTION;  Surgeon: Lorretta Harp, MD;  Location: [place] CV LAB;  Service:  Cardiovascular;  Laterality: Bilateral;  . PERIPHERAL VASCULAR BALLOON ANGIOPLASTY Right 05/08/2017   Procedure: PERIPHERAL VASCULAR BALLOON ANGIOPLASTY;  Surgeon: Lorretta Harp, MD;  Location: [place] CV LAB;  Service: Cardiovascular;  Laterality: Right;  sfa  . PERIPHERAL VASCULAR INTERVENTION Right 05/08/2017   Procedure: PERIPHERAL VASCULAR INTERVENTION;  Surgeon: Lorretta Harp, MD;  Location: [place] CV LAB;  Service: Cardiovascular;  Laterality: Right;  ext iliac  . PORTACATH PLACEMENT Right 11/22/2017   Procedure: INSERTION PORT-A-CATH;  Surgeon: Leighton Ruff, MD;  Location: [facility] ORS;  Service: General;  Laterality: Right;  . SAVORY DILATION N/A 10/04/2018   Procedure: SAVORY DILATION;  Surgeon: Carol Ada, MD;  Location: [facility] ENDOSCOPY;  Service: Endoscopy;  Laterality: N/A;  . TOE SURGERY Right    2n toe   . UMBILICAL HERNIA REPAIR  2000  . UMBILICAL HERNIA REPAIR N/A 08/03/2018   Procedure: UMBILICAL HERNIA REPAIR;  Surgeon: Leighton Ruff, MD;  Location: [facility] ORS;  Service: General;  Laterality: N/A;  . XI ROBOTIC ASSISTED LOWER ANTERIOR RESECTION N/A 08/03/2018   Procedure: XI ROBOTIC ASSISTED LOWER ANTERIOR RESECTION; DIAGNOSTIC FLEXIBLE SIGMOIDOSCOPY; INTRAOPERATIVE ASSESSMENT OF VASCULAR PERFUSION;  Surgeon: Leighton Ruff, MD;  Location: [facility] ORS;  Service: General;  Laterality: N/A;     reports that she has quit smoking. Her smoking use included cigarettes. She has a 24.00 pack-year smoking history. She has never used smokeless tobacco. She reports that she does not drink alcohol or use drugs.  Allergies  Allergen Reactions  . Kiwi Extract Itching and Other (See Comments)    Throat itching   . Lisinopril Other (See Comments)    Bruising   . Plavix [Clopidogrel] Other (See Comments)    "Made me feel badly- didn't agree with my system"    Family History  Problem Relation Age of Onset  . Hypertension Mother   . Stroke Mother   . Cancer Father        prostate    . Mental illness Sister   . Diabetes Brother   . Hypertension Brother   . Cancer Brother 58       prostate  . Diabetes Brother   . Hypertension Brother   . Cancer Brother        "tumors on neck and body"      Prior to Admission medications   Medication Sig Start Date End Date Taking? Authorizing Provider  albuterol (PROVENTIL HFA;VENTOLIN HFA) 108 (90 BASE) MCG/ACT inhaler Inhale 2 puffs into the lungs every 6 (six) hours as needed for wheezing or shortness of breath.   Yes [provider]  aspirin 81 MG tablet Take 81 mg by mouth daily.   Yes [provider]  cetirizine (ZYRTEC) 10 MG tablet Take 10 mg by mouth daily.   Yes [provider]  FERREX 150 150 MG capsule Take 150 mg by mouth 2 (two) times daily. 08/29/17  Yes [provider]  levothyroxine (SYNTHROID, LEVOTHROID) 50 MCG tablet Take 1 tablet (50 mcg total) by mouth daily. 01/01/18  Yes Minette Brine, FNP  Misc Natural Products (OSTEO BI-FLEX ADV TRIPLE ST) TABS Take 1 tablet by mouth 2 (two) times daily.   Yes [provider]  Multiple Vitamins-Minerals (SENIOR MULTIVITAMIN PLUS PO) Take 1 tablet by mouth daily with breakfast.   Yes [provider]  omeprazole (PRILOSEC OTC) 20 MG tablet Take 20 mg by mouth daily before breakfast.   Yes [provider]  Psyllium (METAMUCIL FIBER PO) Take 1 Scoop by mouth See admin instructions. Mix 1 scoopful into 6-8 ounces of desired beverage and drink two times a day   Yes [provider]  tetrahydrozoline-zinc (VISINE-AC) 0.05-0.25 % ophthalmic solution Place 1 drop into both eyes 2 (two) times daily as needed (dry eyes).   Yes [provider]  Triamcinolone Acetonide (NASACORT ALLERGY 24HR NA) Place 1 spray into both nostrils daily as needed (allergies).    Yes [provider]  Trolamine Salicylate (ASPERCREME EX) Apply 1 application topically daily as needed (to affected areas, for joint pain).    Yes  [provider]  acetaminophen (TYLENOL) 500 MG tablet Take 500 mg by mouth 2 (two) times daily as needed for mild pain, moderate pain or headache.     [provider]  amLODipine (NORVASC) 10 MG tablet Take 1 tablet (10 mg total) by mouth daily. Patient not taking: Reported on 03/29/2019 10/06/18   Raiford Noble Latif, DO  atorvastatin (LIPITOR) 10 MG tablet Take 1 tablet (10 mg total) by mouth daily. Patient taking differently: Take 10 mg by mouth at bedtime.  01/01/18   Minette Brine, FNP  Calcium Carbonate-Vitamin D (CALCIUM 600+D PO) Take 1 tablet by mouth daily.    [provider]  Cholecalciferol (VITAMIN D3) 50 MCG (2000 UT) TABS Take 4,000 Units by mouth daily.    [provider]  gabapentin (NEURONTIN) 300 MG capsule Take 300 mg by mouth 2 (two) times daily.     [provider]  glimepiride (AMARYL) 1 MG tablet Take 1 mg by mouth 2 (two) times daily.    [provider]  metFORMIN (GLUCOPHAGE-XR) 500 MG 24 hr tablet TAKE 2 TABLETS BY MOUTH  TWICE A DAY Patient taking differently: Take 1,000 mg by mouth 2 (two) times daily with a meal.  12/27/17   Minette Brine, FNP  triamterene-hydrochlorothiazide (MAXZIDE) 75-50 MG tablet Take 1 tablet by mouth daily. 03/11/19   [provider]  vitamin B-12 (CYANOCOBALAMIN) 500 MCG tablet Take 500 mcg by mouth daily.    [provider]    Physical Exam: Vitals:   03/29/19 1937  BP: (!) 144/64  Pulse: 94  Resp: 18  Temp: 98.7 F (37.1 C)  TempSrc: Oral  SpO2: 100%  Weight: 72.6 kg  Height: 5' 6.5" (1.689 m)    Constitutional: NAD, calm, comfortable Eyes: PERRL, lids and conjunctivae normal ENMT: Mucous membranes are moist. Posterior pharynx clear of any exudate or lesions.Normal dentition.  Neck: normal, supple, no masses, no thyromegaly Respiratory: clear to auscultation bilaterally, no wheezing, no crackles. Normal respiratory effort. No accessory muscle use.    Cardiovascular: Regular rate and rhythm, no murmurs / rubs / gallops. No extremity edema. 2+ pedal pulses. No carotid bruits.  Abdomen: no tenderness, no masses palpated. No hepatosplenomegaly. Bowel sounds positive.  Musculoskeletal: no clubbing / cyanosis. No joint deformity upper and lower extremities. Good ROM, no contractures. Normal muscle tone.  Skin: no  rashes, lesions, ulcers. No induration Neurologic: CN 2-12 grossly intact. Sensation intact, DTR normal. Strength 5/5 in all 4.  Psychiatric: Normal judgment and insight. Alert and oriented x 3. Normal mood.    Labs on Admission: I have personally reviewed following labs and imaging studies  CBC: Recent Labs  Lab 03/29/19 1953  WBC 3.9*  HGB 7.8*  HCT 25.0*  MCV 98.0  PLT 056   Basic Metabolic Panel: Recent Labs  Lab 03/29/19 1953  NA 137  K 5.1  CL 105  CO2 20*  GLUCOSE 146*  BUN 65*  CREATININE 5.04*  CALCIUM 9.6   GFR: Estimated Creatinine Clearance: 10.6 mL/min (A) (by C-G formula based on SCr of 5.04 mg/dL (H)). Liver Function Tests: No results for input(s): AST, ALT, ALKPHOS, BILITOT, PROT, ALBUMIN in the last 168 hours. No results for input(s): LIPASE, AMYLASE in the last 168 hours. No results for input(s): AMMONIA in the last 168 hours. Coagulation Profile: No results for input(s): INR, PROTIME in the last 168 hours. Cardiac Enzymes: No results for input(s): CKTOTAL, CKMB, CKMBINDEX, TROPONINI in the last 168 hours. BNP (last 3 results) No results for input(s): PROBNP in the last 8760 hours. HbA1C: No results for input(s): HGBA1C in the last 72 hours. CBG: No results for input(s): GLUCAP in the last 168 hours. Lipid Profile: No results for input(s): CHOL, HDL, LDLCALC, TRIG, CHOLHDL, LDLDIRECT in the last 72 hours. Thyroid Function Tests: No results for input(s): TSH, T4TOTAL, FREET4, T3FREE, THYROIDAB in the last 72 hours. Anemia Panel: No results for input(s): VITAMINB12, FOLATE, FERRITIN,  TIBC, IRON, RETICCTPCT in the last 72 hours. Urine analysis:    Component Value Date/Time   COLORURINE YELLOW 10/01/2018 1936   APPEARANCEUR HAZY (A) 10/01/2018 1936   LABSPEC 1.016 10/01/2018 1936   PHURINE 5.0 10/01/2018 1936   GLUCOSEU NEGATIVE 10/01/2018 1936   HGBUR MODERATE (A) 10/01/2018 1936   BILIRUBINUR NEGATIVE 10/01/2018 1936   BILIRUBINUR negative 11/27/2014 [place] 10/01/2018 1936   PROTEINUR NEGATIVE 10/01/2018 1936   UROBILINOGEN 0.2 11/27/2014 1052   NITRITE NEGATIVE 10/01/2018 1936   LEUKOCYTESUR MODERATE (A) 10/01/2018 1936    Radiological Exams on Admission: US Renal  Result Date: 03/29/2019 CLINICAL DATA:  Acute kidney injury, elevated BUN and creatinine EXAM: RENAL / URINARY TRACT ULTRASOUND COMPLETE COMPARISON:  Prior ultrasound 10/02/2018, CT abdomen pelvis 01/17/2018 FINDINGS: Right Kidney: Renal measurements: 9.1 x 4.4 x 6.7 cm = volume: 131.9 mL . Echogenicity within normal limits. No mass, shadowing calculus or hydronephrosis. Visualized. Left Kidney: Renal measurements: 9.0 x 4.6 x 5.2 cm = volume: 11.2 mL. Suboptimal visualization secondary to bowel gas. Echogenicity within normal limits. No visible mass, shadowing calculus or hydronephrosis within the visible portions. Bladder: Appears normal for degree of bladder distention. Other: None. IMPRESSION: Suboptimal visualization of the left kidney due to overlying bowel gas. Grossly unremarkable renal ultrasound within the limitations of the exam. Electronically Signed   By: Lovena Le M.D.   On: 03/29/2019 23:27    EKG: Independently reviewed.  Assessment/Plan Principal Problem:   AKI (acute kidney injury) ([place]) Active Problems:   Essential hypertension   Non-insulin treated type 2 diabetes mellitus ([place])   PAD (peripheral artery disease) Rt ABI 0.7, Lt ABI 0.46   Rectal cancer ([place])   Acute kidney injury ([place])   DVT (deep venous thrombosis) ([place])    1. AKI - 1. Worsening renal  function, ? Subacute over past months? 2. Doesn't appear dehydrated 3. No obstruction  on [place] 4. UA pending 5. Hold Maxzide 6. Needs nephrology consult in AM 2. H/o Rectal cancer - 1. Looks to be overdue for CT abd pelvis for survaliance based on Nov onc note they wanted this in [place]. 2. Will get CT abd/pelvis, though this will be without IV contrast today due to AKI 3. Ostomy care 3. DVT - 1. DVT in Nov, never treated 2. BLE swelling persists since that time 3. Will put on heparin gtt for the moment 4. Check repeat US BLE 4. PAD - 1. Cont ASA 81 5. HTN - 1. Holding maxzide 6. DM2 - 1. Sensitive SSI AC/HS 2. Hold metformin and amaryl  DVT prophylaxis: Heparin per pharm Code Status: Full Family Communication: No family in room Disposition Plan: Home after admit Consults called: None, call nephro in AM Admission status: Admit to inpatient  Severity of Illness: The appropriate patient status for this patient is INPATIENT. Inpatient status is judged to be reasonable and necessary in order to provide the required intensity of service to ensure the patient's safety. The patient's presenting symptoms, physical exam findings, and initial radiographic and laboratory data in the context of their chronic comorbidities is felt to place them at high risk for further clinical deterioration. Furthermore, it is not anticipated that the patient will be medically stable for discharge from the hospital within 2 midnights of admission. The following factors support the patient status of inpatient.   IP status for AKF with creat of 5 up from 1.0 baseline.  * I certify that at the point of admission it is my clinical judgment that the patient will require inpatient hospital care spanning beyond 2 midnights from the point of admission due to high intensity of service, high risk for further deterioration and high frequency of surveillance required.*    Slevin Gunby M. DO [facility]  How to  contact the [facility] Attending or Consulting provider [place] or covering provider during after hours [place], for this patient?  1. Check the care team in [facility] and look for a) attending/consulting TR[facility] provider listed and b) the [facility] team listed 2. Log into [email or web address]  Amion Physician Scheduling and messaging for groups and whole hospitals  On call and physician scheduling software for group practices, residents, hospitalists and other medical providers for call, clinic, rotation and shift schedules. On[employer] is a hospital-wide system for scheduling doctors and paging doctors on call. EasyPlot is for scientific plotting and data analysis.  [email or web address]  and use [place]'s universal password to access. If you do not have the password, please contact the hospital operator.  3. Locate the [facility] provider you are looking for under [facility] and page to a number that you can be directly reached. 4. If you still have difficulty reaching the provider, please page the [facility] (Director on Call) for the Hospitalists listed on amion for assistance.  03/29/2019, 11:35 PM

## 2019-03-29 NOTE — Progress Notes (Signed)
ANTICOAGULATION CONSULT NOTE - Initial Consult  Pharmacy Consult for Heparin Indication: DVT   Allergies  Allergen Reactions  . Kiwi Extract Itching and Other (See Comments)    Throat itching   . Lisinopril Other (See Comments)    Bruising   . Plavix [Clopidogrel] Other (See Comments)    "Made me feel badly- didn't agree with my system"    Patient Measurements: Height: 5' 6.5" (168.9 cm) Weight: 160 lb (72.6 kg) IBW/kg (Calculated) : 60.45 Heparin Dosing Weight: 72 kg  Vital Signs: Temp: 98.7 F (37.1 C) (03/19 1937) Temp Source: Oral (03/19 1937) BP: 144/64 (03/19 1937) Pulse Rate: 94 (03/19 1937)  Labs: Recent Labs    03/29/19 1953  HGB 7.8*  HCT 25.0*  PLT 338  CREATININE 5.04*    Estimated Creatinine Clearance: 10.6 mL/min (A) (by C-G formula based on SCr of 5.04 mg/dL (H)).   Medical History: Past Medical History:  Diagnosis Date  . Age-related nuclear cataract, right eye   . Allergy   . Anemia   . Arthritis    "joints sometimes" (12/25/2012)  . Clotting disorder ([place])   . Colon cancer ([place])   . Critical lower limb ischemia   . Diverticulosis   . Gangrene of toe, Rt second toe 12/25/2012  . GERD (gastroesophageal reflux disease)   . High cholesterol   . Hypertension   . Hypothyroidism   . PAD (peripheral artery disease) ([place])   . PAD (peripheral artery disease) ([place])   . Peripheral neuropathy   . Rectal cancer ([place])   . S/P arterial stent, 12/25/13, successful diamondback orbital rotational arthrectomy, PTA using chocolate  balloon and stenting using I DEV stent of long segment calcified high-grade proximal and mid r 12/25/2012  . Tobacco abuse   . Type II diabetes mellitus ([facility])     Medications:  See electronic med rec  Assessment: 69 y.o. F presents with leg swelling. 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 To begin heparin per pharmacy. Pt with AKI on CKD- SCr up to 5.04 (was 2.14 ~4 mos ago). No AC PTA. Hgb 7.8 on admission  (has been ~8 past few months).  Goal of Therapy:  Heparin level 0.3-0.7 units/ml Monitor platelets by anticoagulation protocol: Yes   Plan:  D/c order for Lovenox - none given Heparin IV bolus 4000 units Heparin gtt at 1000 units/hr Will f/u heparin level in 8 hours Daily heparin level and CBC  Sherlon Handing, PharmD, BCPS Please see amion for complete clinical pharmacist phone list 03/29/2019,11:37 PM

## 2019-03-29 NOTE — ED Notes (Signed)
Pt to US.

## 2019-03-29 NOTE — ED Triage Notes (Signed)
Pt reports bilateral leg swelling for a couple, accompanied with pain, numbness, and weeping. Pt says she was sent here by her doctor for admission. Denies CP, sob, or dizziness.

## 2019-03-29 NOTE — ED Provider Notes (Signed)
[facility] EMERGENCY DEPARTMENT Provider Note   CSN: [id number] Arrival date & time: 03/29/19  1925     History Chief Complaint  Patient presents with  . Leg Swelling    Nicole Sellers is a 69 y.o. female.  69 year old female with a history of hypertension, PAD (s/p right EIA stenting and right SFA angioplasty and stenting), HLD, DM, hypothyroid, CKD presents to the emergency department for admission.  She was seen by her primary care doctor, Dr. Forde Dandy, today who is checking labs and noticed that the patient's creatinine was elevated.  He recommended admission for acute on chronic renal failure and uremia.  The patient is complaining mostly of swelling to her bilateral lower extremities.  This is fairly chronic from notes reviewed on care everywhere.  She states that she has been eating and drinking well.  No recent fevers or illness.  Denies vomiting, bowel changes.  She is not on chronic anticoagulation.  The history is provided by the patient. No language interpreter was used.       Past Medical History:  Diagnosis Date  . Age-related nuclear cataract, right eye   . Allergy   . Anemia   . Arthritis    "joints sometimes" (12/25/2012)  . Clotting disorder ([place])   . Colon cancer ([place])   . Critical lower limb ischemia   . Diverticulosis   . Gangrene of toe, Rt second toe 12/25/2012  . GERD (gastroesophageal reflux disease)   . High cholesterol   . Hypertension   . Hypothyroidism   . PAD (peripheral artery disease) ([place])   . PAD (peripheral artery disease) ([place])   . Peripheral neuropathy   . Rectal cancer ([place])   . S/P arterial stent, 12/25/13, successful diamondback orbital rotational arthrectomy, PTA using chocolate  balloon and stenting using I DEV stent of long segment calcified high-grade proximal and mid r 12/25/2012  . Tobacco abuse   . Type II diabetes mellitus [facility])     Patient Active Problem List   Diagnosis Date Noted  . Oral candidiasis    . AKI (acute kidney injury) ([place]) 10/01/2018  . Hypokalemia 10/01/2018  . Pyuria 10/01/2018  . Odynophagia 10/01/2018  . Hyponatremia 10/01/2018  . Right sided weakness 10/01/2018  . SBO (small bowel obstruction) ([place]) 08/22/2018  . Dehydration 08/21/2018  . Pre-operative cardiovascular examination 06/07/2018  . Rectal cancer ([place]) 11/06/2017  . Claudication in peripheral vascular disease ([place]) 05/08/2017  . Carotid artery disease ([place]) 04/14/2017  . PAD (peripheral artery disease) Rt ABI 0.7, Lt ABI 0.46 12/25/2012  . S/P arterial stent, 12/25/13, successful diamondback orbital rotational arthrectomy, PTA using chocolate  balloon and stenting using I DEV stent of long segment calcified high-grade proximal and mid r 12/25/2012  . Gangrene of toe, Rt second toe 12/25/2012  . Essential hypertension 12/13/2012  . Non-insulin treated type 2 diabetes mellitus ([place]) 12/13/2012  . Critical lower limb ischemia 12/13/2012  . Tobacco abuse 12/13/2012    Past Surgical History:  Procedure Laterality Date  . ABDOMINAL AORTAGRAM  12/20/2012   Procedure: ABDOMINAL AORTAGRAM;  Surgeon: Lorretta Harp, MD;  Location: [facility] CATH LAB;  Service: Cardiovascular;;  . ABDOMINAL HYSTERECTOMY  2000  . ANGIOPLASTY / STENTING FEMORAL Right 12/25/2012  . ATHERECTOMY Right 12/25/2012   Procedure: ATHERECTOMY;  Surgeon: Lorretta Harp, MD;  Location: [facility] CATH LAB;  Service: Cardiovascular;  Laterality: Right;  right SFA  . COLONOSCOPY    . DIVERTING ILEOSTOMY N/A 08/03/2018   Procedure: DIVERTING  LOOP ILEOSTOMY;  Surgeon: Leighton Ruff, MD;  Location: [facility] ORS;  Service: General;  Laterality: N/A;  . ESOPHAGOGASTRODUODENOSCOPY N/A 10/04/2018   Procedure: ESOPHAGOGASTRODUODENOSCOPY (EGD);  Surgeon: Carol Ada, MD;  Location: [facility] ENDOSCOPY;  Service: Endoscopy;  Laterality: N/A;  . EYE SURGERY Left    cataract  . HERNIA REPAIR    . LOWER EXTREMITY ANGIOGRAM N/A 12/20/2012   Procedure: LOWER EXTREMITY  ANGIOGRAM;  Surgeon: Lorretta Harp, MD;  Location: [facility] CATH LAB;  Service: Cardiovascular;  Laterality: N/A;  . LOWER EXTREMITY ANGIOGRAM N/A 12/25/2012   Procedure: LOWER EXTREMITY ANGIOGRAM;  Surgeon: Lorretta Harp, MD;  Location: [facility] CATH LAB;  Service: Cardiovascular;  Laterality: N/A;  . LOWER EXTREMITY INTERVENTION  05/08/2017  . LOWER EXTREMITY INTERVENTION Bilateral 05/08/2017   Procedure: LOWER EXTREMITY INTERVENTION;  Surgeon: Lorretta Harp, MD;  Location: [facility] CV LAB;  Service: Cardiovascular;  Laterality: Bilateral;  . PERIPHERAL VASCULAR BALLOON ANGIOPLASTY Right 05/08/2017   Procedure: PERIPHERAL VASCULAR BALLOON ANGIOPLASTY;  Surgeon: Lorretta Harp, MD;  Location: [facility] CV LAB;  Service: Cardiovascular;  Laterality: Right;  sfa  . PERIPHERAL VASCULAR INTERVENTION Right 05/08/2017   Procedure: PERIPHERAL VASCULAR INTERVENTION;  Surgeon: Lorretta Harp, MD;  Location: [facility] CV LAB;  Service: Cardiovascular;  Laterality: Right;  ext iliac  . PORTACATH PLACEMENT Right 11/22/2017   Procedure: INSERTION PORT-A-CATH;  Surgeon: Leighton Ruff, MD;  Location: [facility] ORS;  Service: General;  Laterality: Right;  . SAVORY DILATION N/A 10/04/2018   Procedure: SAVORY DILATION;  Surgeon: Carol Ada, MD;  Location: [facility] ENDOSCOPY;  Service: Endoscopy;  Laterality: N/A;  . TOE SURGERY Right    2n toe   . UMBILICAL HERNIA REPAIR  2000  . UMBILICAL HERNIA REPAIR N/A 08/03/2018   Procedure: UMBILICAL HERNIA REPAIR;  Surgeon: Leighton Ruff, MD;  Location: [facility] ORS;  Service: General;  Laterality: N/A;  . XI ROBOTIC ASSISTED LOWER ANTERIOR RESECTION N/A 08/03/2018   Procedure: XI ROBOTIC ASSISTED LOWER ANTERIOR RESECTION; DIAGNOSTIC FLEXIBLE SIGMOIDOSCOPY; INTRAOPERATIVE ASSESSMENT OF VASCULAR PERFUSION;  Surgeon: Leighton Ruff, MD;  Location: [facility] ORS;  Service: General;  Laterality: N/A;     OB History   No obstetric history on file.     Family History  Problem Relation  Age of Onset  . Hypertension Mother   . Stroke Mother   . Cancer Father        prostate  . Mental illness Sister   . Diabetes Brother   . Hypertension Brother   . Cancer Brother 80       prostate  . Diabetes Brother   . Hypertension Brother   . Cancer Brother        "tumors on neck and body"     Social History   Tobacco Use  . Smoking status: Former Smoker    Packs/day: 0.50    Years: 48.00    Pack years: 24.00    Types: Cigarettes  . Smokeless tobacco: Never Used  . Tobacco comment: quit in 2016  Substance Use Topics  . Alcohol use: No    Alcohol/week: 0.0 standard drinks  . Drug use: No    Home Medications Prior to Admission medications   Medication Sig Start Date End Date Taking? Authorizing Provider  acetaminophen (TYLENOL) 500 MG tablet Take 500 mg by mouth 2 (two) times daily as needed for moderate pain or headache.     [provider]  albuterol (PROVENTIL HFA;VENTOLIN HFA) 108 (90 BASE) MCG/ACT inhaler Inhale 2 puffs  into the lungs every 6 (six) hours as needed for wheezing or shortness of breath.    [provider]  amLODipine (NORVASC) 10 MG tablet Take 1 tablet (10 mg total) by mouth daily. 10/06/18   Raiford Noble Latif, DO  aspirin 81 MG tablet Take 81 mg by mouth daily.    [provider]  atorvastatin (LIPITOR) 10 MG tablet Take 1 tablet (10 mg total) by mouth daily. 01/01/18   Minette Brine, FNP  Calcium Carbonate-Vitamin D (CALCIUM 600+D PO) Take 1 tablet by mouth daily.    [provider]  cetirizine (ZYRTEC) 10 MG tablet Take 10 mg by mouth daily.    [provider]  cholecalciferol (VITAMIN D) 1000 units tablet Take 4,000 Units by mouth daily.     [provider]  FERREX 150 150 MG capsule Take 150 mg by mouth 2 (two) times daily. 08/29/17   [provider]  fluconazole (DIFLUCAN) 100 MG tablet Take 1 tablet (100 mg total) by mouth daily. 10/07/18   Raiford Noble Latif, DO  gabapentin  (NEURONTIN) 300 MG capsule Take 300 mg by mouth 2 (two) times daily.     [provider]  glimepiride (AMARYL) 1 MG tablet Take 1 mg by mouth 2 (two) times daily.    [provider]  KLOR-CON M20 20 MEQ tablet TAKE 1 TABLET TWICE A DAY FOR 5 DAYS,THEN 1 TABLET ONCE DAILY FOR 1 MONTH 01/10/19   Truitt Merle, MD  levothyroxine (SYNTHROID, LEVOTHROID) 50 MCG tablet Take 1 tablet (50 mcg total) by mouth daily. 01/01/18   Minette Brine, FNP  lidocaine-prilocaine (EMLA) cream Apply to affected area once 11/18/17   Truitt Merle, MD  magic mouthwash SOLN Take 5 mLs by mouth 3 (three) times daily as needed for mouth pain. 10/06/18   Sheikh, Omair Latif, DO  metFORMIN (GLUCOPHAGE-XR) 500 MG 24 hr tablet TAKE 2 TABLETS BY MOUTH  TWICE A DAY Patient taking differently: Take 1,000 mg by mouth 2 (two) times a day. Give w/food. 12/27/17   Minette Brine, FNP  mirtazapine (REMERON) 7.5 MG tablet Take 2 tablets (15 mg total) by mouth at bedtime. Patient taking differently: Take 15 mg by mouth at bedtime as needed (sleep).  02/26/18   Truitt Merle, MD  Misc Natural Products (OSTEO BI-FLEX ADV TRIPLE ST) TABS Take 1 tablet by mouth 2 (two) times daily.    [provider]  Multiple Vitamin (MULTIVITAMIN) capsule Take 1 capsule by mouth daily.    [provider]  ondansetron (ZOFRAN) 4 MG tablet Take 1 tablet (4 mg total) by mouth every 6 (six) hours as needed for nausea. 10/06/18   Sheikh, Omair Latif, DO  ondansetron (ZOFRAN) 8 MG tablet Take 1 tablet (8 mg total) by mouth 2 (two) times daily as needed for refractory nausea / vomiting. Start on day 3 after chemotherapy. 11/18/17   Truitt Merle, MD  pantoprazole (PROTONIX) 40 MG tablet Take 1 tablet (40 mg total) by mouth daily. 10/06/18   Raiford Noble Latif, DO  polycarbophil (FIBERCON) 625 MG tablet Take 1 tablet (625 mg total) by mouth 3 (three) times daily. 1/60/10   Leighton Ruff, MD  tetrahydrozoline-zinc (VISINE-AC) 0.05-0.25 % ophthalmic  solution Place 1 drop into both eyes 2 (two) times daily as needed (dry eyes).    [provider]  thiamine 100 MG tablet Take 1 tablet (100 mg total) by mouth daily. 10/07/18   Raiford Noble Latif, DO  Triamcinolone Acetonide (NASACORT ALLERGY 24HR NA) Place  1 spray into the nose daily as needed (allergies).    [provider]  triamterene-hydrochlorothiazide (MAXZIDE) 75-50 MG tablet Take 1 tablet by mouth daily. 03/11/19   [provider]  Trolamine Salicylate (ASPERCREME EX) Apply 1 application topically daily as needed (joint pain).    [provider]  vitamin B-12 (CYANOCOBALAMIN) 500 MCG tablet Take 500 mcg by mouth daily.    [provider]    Allergies    Kiwi extract, Lisinopril, and Plavix [clopidogrel]  Review of Systems   Review of Systems  Ten systems reviewed and are negative for acute change, except as noted in the HPI.    Physical Exam Updated Vital Signs BP (!) 144/64 (BP Location: Right Arm)   Pulse 94   Temp 98.7 F (37.1 C) (Oral)   Resp 18   Ht 5' 6.5" (1.689 m)   Wt 72.6 kg   SpO2 100%   BMI 25.44 kg/m   Physical Exam Vitals and nursing note reviewed.  Constitutional:      General: She is not in acute distress.    Appearance: She is well-developed. She is not diaphoretic.     Comments: Nontoxic appearing and pleasant AA female.  HENT:     Head: Normocephalic and atraumatic.  Eyes:     General: No scleral icterus.    Conjunctiva/sclera: Conjunctivae normal.  Cardiovascular:     Rate and Rhythm: Normal rate and regular rhythm.     Pulses: Normal pulses.     Comments: DP and PT pulse 1+ bilaterally. Pulses confirmed with bedside doppler. Capillary refill <3 seconds in distal digits. Pulmonary:     Effort: Pulmonary effort is normal. No respiratory distress.     Comments: Respirations even and unlabored Musculoskeletal:        General: Normal range of motion.     Cervical back: Normal range of motion.      Right lower leg: Edema present.     Left lower leg: Edema present.     Comments: Pitting 2+ in BLE. No crepitus or deformity. Hyperpigmentation of distal BLE, presumed 2/2 to hx of PVD. No lymphangitic streaking, heat to touch.  Skin:    General: Skin is warm and dry.     Coloration: Skin is not pale.     Findings: No erythema or rash.     Comments: BLE appear well perfused without mottling  Neurological:     Mental Status: She is alert and oriented to person, place, and time.     Comments: GCS 15. Moving all extremities spontaneously. sensation intact in BLE. Able to transition in the bed unassisted.  Psychiatric:        Behavior: Behavior normal.     ED Results / Procedures / Treatments   Labs (all labs ordered are listed, but only abnormal results are displayed) Labs Reviewed  BASIC METABOLIC PANEL - Abnormal; Notable for the following components:      Result Value   CO2 20 (*)    Glucose, Bld 146 (*)    BUN 65 (*)    Creatinine, Ser 5.04 (*)    GFR calc non Af Amer 8 (*)    GFR calc Af Amer 9 (*)    All other components within normal limits  CBC - Abnormal; Notable for the following components:   WBC 3.9 (*)    RBC 2.55 (*)    Hemoglobin 7.8 (*)    HCT 25.0 (*)    All other components within normal limits  SARS CORONAVIRUS 2 (TAT 6-24 HRS)    EKG None  Radiology No results found.  Procedures Procedures (including critical care time)  Medications Ordered in ED Medications  sodium chloride 0.9 % bolus 500 mL (has no administration in time range)    ED Course  I have reviewed the triage vital signs and the nursing notes.  Pertinent labs & imaging results that were available during my care of the patient were reviewed by me and considered in my medical decision making (see chart for details).    MDM Rules/Calculators/A&P                      69 year old female presents to the emergency department at the advice of her PCP for admission due to acute on  chronic renal failure.  Creatinine most recently 2.14 in November.  Today it is 5.04.  She reports adequate PO intake at home.  No vomiting, abdominal pain, back pain, recent fevers.  Is not on diuretics and denies any other medication changes.  IVF hydration initiated in the ED and US renal ordered.  Will require admission to the hospitalist service.  Consult placed.  As an aside, patient complaining of lower extremity swelling.  Per chart review notes, this is fairly chronic.  The patient does appear to have had venous duplex in November which was positive for acute DVT in the right popliteal vein and the right peroneal veins.  No evidence of DVT in the left lower extremity.  It does not appear that the patient was started on any anticoagulation following this study.  The patient is unsure of why this is; denies fall risk, hx of ICH.    She has been seen recently by Dr. Gwenlyn Found, her cardiologist, complaining of her legs feeling "numb" all the time with bilateral claudication.  She had an arterial lower extremity study completed 3 days ago.  This revealed occluded left SFA stent with reconstitution.  There is also occlusion of bilateral peroneal arteries.  Right SFA stent patent.  Today, the patient is neurovascularly intact, able to ambulate and transition.  Her pulses are diminished at 1+, but palpable.  Extremities currently appear well perfused and are warm.  No open sores or ulcerations.  Her physical exam is not presently concerning for cellulitis.  She has no crepitus.  This may require further evaluation while inpatient as well.  Patient agreeable with plan for admission.   Final Clinical Impression(s) / ED Diagnoses Final diagnoses:  Acute renal failure superimposed on chronic kidney disease, unspecified CKD stage, unspecified acute renal failure type [facility])  Peripheral vascular disease [facility])    Rx / DC Orders ED Discharge Orders    None       Antonietta Breach, PA-C 03/29/19 2341      Lajean Saver, MD 03/30/19 1610

## 2019-03-30 ENCOUNTER — Inpatient Hospital Stay (HOSPITAL_COMMUNITY): Payer: Medicare Other

## 2019-03-30 ENCOUNTER — Encounter (HOSPITAL_COMMUNITY): Payer: Self-pay | Admitting: Internal Medicine

## 2019-03-30 DIAGNOSIS — M7989 Other specified soft tissue disorders: Secondary | ICD-10-CM

## 2019-03-30 DIAGNOSIS — I82409 Acute embolism and thrombosis of unspecified deep veins of unspecified lower extremity: Secondary | ICD-10-CM

## 2019-03-30 LAB — HEPARIN LEVEL (UNFRACTIONATED)
Heparin Unfractionated: 0.97 IU/mL — ABNORMAL HIGH (ref 0.30–0.70)
Heparin Unfractionated: 0.86 IU/mL — ABNORMAL HIGH (ref 0.30–0.70)
Heparin Unfractionated: 0.94 IU/mL — ABNORMAL HIGH (ref 0.30–0.70)

## 2019-03-30 LAB — URINALYSIS, COMPLETE (UACMP) WITH MICROSCOPIC
Bilirubin Urine: NEGATIVE
Glucose, UA: NEGATIVE mg/dL
Hgb urine dipstick: NEGATIVE
Ketones, ur: NEGATIVE mg/dL
Nitrite: NEGATIVE
Protein, ur: NEGATIVE mg/dL
Specific Gravity, Urine: 1.011 (ref 1.005–1.030)
pH: 5 (ref 5.0–8.0)

## 2019-03-30 LAB — BASIC METABOLIC PANEL
Anion gap: 10 (ref 5–15)
BUN: 61 mg/dL — ABNORMAL HIGH (ref 8–23)
CO2: 20 mmol/L — ABNORMAL LOW (ref 22–32)
Calcium: 9.3 mg/dL (ref 8.9–10.3)
Chloride: 105 mmol/L (ref 98–111)
Creatinine, Ser: 4.6 mg/dL — ABNORMAL HIGH (ref 0.44–1.00)
GFR calc Af Amer: 10 mL/min — ABNORMAL LOW (ref >60)
GFR calc non Af Amer: 9 mL/min — ABNORMAL LOW (ref >60)
Glucose, Bld: 192 mg/dL — ABNORMAL HIGH (ref 70–99)
Potassium: 4.7 mmol/L (ref 3.5–5.1)
Sodium: 135 mmol/L (ref 135–145)

## 2019-03-30 LAB — GLUCOSE, CAPILLARY
Glucose-Capillary: 112 mg/dL — ABNORMAL HIGH (ref 70–99)
Glucose-Capillary: 116 mg/dL — ABNORMAL HIGH (ref 70–99)
Glucose-Capillary: 160 mg/dL — ABNORMAL HIGH (ref 70–99)
Glucose-Capillary: 82 mg/dL (ref 70–99)
Glucose-Capillary: 95 mg/dL (ref 70–99)

## 2019-03-30 LAB — HEMOGLOBIN A1C
Hgb A1c MFr Bld: 5.4 % (ref 4.8–5.6)
Mean Plasma Glucose: 108.28 mg/dL

## 2019-03-30 LAB — CBC
HCT: 23.6 % — ABNORMAL LOW (ref 36.0–46.0)
Hemoglobin: 7.6 g/dL — ABNORMAL LOW (ref 12.0–15.0)
MCH: 31.7 pg (ref 26.0–34.0)
MCHC: 32.2 g/dL (ref 30.0–36.0)
MCV: 98.3 fL (ref 80.0–100.0)
Platelets: 324 10*3/uL (ref 150–400)
RBC: 2.4 MIL/uL — ABNORMAL LOW (ref 3.87–5.11)
RDW: 13.4 % (ref 11.5–15.5)
WBC: 3.8 10*3/uL — ABNORMAL LOW (ref 4.0–10.5)
nRBC: 0 % (ref 0.0–0.2)

## 2019-03-30 LAB — SARS CORONAVIRUS 2 (TAT 6-24 HRS): SARS Coronavirus 2: NEGATIVE

## 2019-03-30 LAB — SODIUM, URINE, RANDOM: Sodium, Ur: 12 mmol/L

## 2019-03-30 LAB — CREATININE, URINE, RANDOM: Creatinine, Urine: 102.56 mg/dL

## 2019-03-30 MED ORDER — SODIUM CHLORIDE 0.9% FLUSH
10.0000 mL | Freq: Two times a day (BID) | INTRAVENOUS | Status: DC
  Administered 2019-03-30 – 2019-04-04 (×4): 10 mL

## 2019-03-30 MED ORDER — HYDRALAZINE HCL 25 MG PO TABS: 25.0000 mg | ORAL_TABLET | Freq: Three times a day (TID) | ORAL | Status: DC | PRN

## 2019-03-30 MED ORDER — ATORVASTATIN CALCIUM 10 MG PO TABS
10.0000 mg | ORAL_TABLET | Freq: Every day | ORAL | Status: DC
  Administered 2019-03-30 – 2019-04-03 (×5): 10 mg via ORAL
  Filled 2019-03-30 (×5): qty 1

## 2019-03-30 MED ORDER — IOHEXOL 9 MG/ML PO SOLN
500.0000 mL | ORAL | Status: AC
  Administered 2019-03-30 (×2): 500 mL via ORAL

## 2019-03-30 MED ORDER — SODIUM CHLORIDE 0.9% FLUSH: 10.0000 mL | INTRAVENOUS | Status: DC | PRN

## 2019-03-30 MED ORDER — CHLORHEXIDINE GLUCONATE CLOTH 2 % EX PADS
6.0000 | MEDICATED_PAD | Freq: Every day | CUTANEOUS | Status: DC
  Administered 2019-04-01 – 2019-04-04 (×4): 6 via TOPICAL

## 2019-03-30 NOTE — Progress Notes (Signed)
[facility] for Heparin Indication: DVT   Allergies  Allergen Reactions  . Kiwi Extract Itching and Other (See Comments)    Throat itching   . Lisinopril Other (See Comments)    Bruising   . Plavix [Clopidogrel] Other (See Comments)    "Made me feel badly- didn't agree with my system"    Patient Measurements: Height: 5' 6.5" (168.9 cm) Weight: 160 lb (72.6 kg) IBW/kg (Calculated) : 60.45 Heparin Dosing Weight: 72 kg  Vital Signs: Temp: 98.2 F (36.8 C) (03/20 0352) Temp Source: Oral (03/20 0352) BP: 145/63 (03/20 0352) Pulse Rate: 81 (03/20 0352)  Labs: Recent Labs    03/29/19 1953 03/30/19 0346 03/30/19 0905  HGB 7.8* 7.6*  --   HCT 25.0* 23.6*  --   PLT 338 324  --   HEPARINUNFRC  --  0.97* 0.86*  CREATININE 5.04* 4.60*  --     Estimated Creatinine Clearance: 11.6 mL/min (A) (by C-G formula based on SCr of 4.6 mg/dL (H)).   Assessment: 69 y.o. F presents with leg swelling. 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 Pharmacy consulted to begin heparin infusion. Pt with AKI on CKD- SCr up to 5.04 (was 2.14 ~4 mos ago). No anticoagulation PTA. Hgb 7.8 on admission (has been ~8 past few months).  Today, heparin level remains supratherapeutic but trending down as expected at 0.86. Hgb low but stable from previous at 7.6 and platelets WNL. Per RN, the patient's IV site was occluded off and on this mornign (possibly from patient positioning?) and she found minor bleeding from the IV site. RN was able to change IV site successfully and has had no further issues or bleeding.   Goal of Therapy:  Heparin level 0.3-0.7 units/ml Monitor platelets by anticoagulation protocol: Yes   Plan:  Decrease heparin infusion to 850 units/hr Check 8h heparin level Monitor CBC, daily heparin level while on heparin Continue to monitor for signs/symptoms of bleeding F/u transition to oral anticoagulant   Brendolyn Patty, PharmD PGY2  Pharmacy Resident Phone [phone]

## 2019-03-30 NOTE — Progress Notes (Addendum)
ANTICOAGULATION CONSULT NOTE   Pharmacy Consult for Heparin Indication: DVT 11/17/18 still present 03/26/19   Patient Measurements: Height: 5' 6.5" (168.9 cm) Weight: 160 lb (72.6 kg) IBW/kg (Calculated) : 60.45 Heparin Dosing Weight: 72 kg  Vital Signs: Temp: 97.9 F (36.6 C) (03/20 1952) Temp Source: Oral (03/20 1952) BP: 152/64 (03/20 1952) Pulse Rate: 79 (03/20 1952)  Labs: Recent Labs    03/29/19 1953 03/30/19 0346 03/30/19 0905 03/30/19 2019  HGB 7.8* 7.6*  --   --   HCT 25.0* 23.6*  --   --   PLT 338 324  --   --   HEPARINUNFRC  --  0.97* 0.86* 0.94*  CREATININE 5.04* 4.60*  --   --     Estimated Creatinine Clearance: 11.6 mL/min (A) (by C-G formula based on SCr of 4.6 mg/dL (H)).   Assessment: 69 y.o. F presents with leg swelling. 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 Pharmacy consulted to begin heparin infusion. Pt with AKI on CKD- SCr up to 5.04 (was 2.14 ~4 mos ago). No anticoagulation PTA. Hgb 7.8 on admission (has been ~8 past few months).  HL still supratherapeutic. Patient has a port where HL was drawn. Heparin was infusing via PIV so less likely likely that sample included heparin. Asked RN to hold heparin for a few minutes before AM HL draw. Consulted IV team with same request. Will lower dose.   Goal of Therapy:  Heparin level 0.3-0.7 units/ml Monitor platelets by anticoagulation protocol: Yes   Plan:  Decrease heparin infusion to 750 units/hr Monitor daily HL, CBC/plt Monitor for signs/symptoms of bleeding  F/u transition to oral anticoagulant  Benetta Spar, PharmD, BCPS, [place] Clinical Pharmacist  Please check AMION for all [place] phone numbers After 10:00 PM, call [place]

## 2019-03-30 NOTE — Progress Notes (Signed)
VASCULAR LAB PRELIMINARY  PRELIMINARY  PRELIMINARY  PRELIMINARY  Bilateral lower extremity venous duplex completed.    Preliminary report:  See CV proc for preliminary results.   Quantae Martel, RVT 03/30/2019, 2:28 PM

## 2019-03-30 NOTE — Consult Note (Signed)
Renal Service Consult Note [place] 03/30/2019 Sol Blazing Requesting Physician:  Dr [place]  Reason for Consult:  Renal failure HPI: The patient is a 69 y.o. year-old w/ hx of NIDDM, tobacco use, PAD, HTN, HL, PAD, colon/ rectal cancer, DJD and anemia presented to ED on 03/29/19 sent by PCP for [facility] kidney labs.  In ED creat was 5.0, up from 2.1 in Nov 2020.  BUN 65. Pt was admitted, diuretics held and UA ordered. Renal US w/o obstruction. Asked to see for renal failure.   Per H&P pt was dx'd w/ stage 3 rectal Ca in 2019 is sp chemo, sp resection (July 2020) and was felt to be in remission as of 11/15/18 office visit. Labs and duplex ordered that visit showed creat 2.0 and DVT of RLE and pt was supposed to go to ED for that but never showed up.    Pt seen in room, sitting up on side of bed, no c/o's except for swelling in the legs. No n/v, abd pain or confusion or jerking.  No nsaid use at home.   ROS  denies CP  no joint pain   no HA  no blurry vision  no rash  no diarrhea  no nausea/ vomiting  no dysuria  no difficulty voiding  no change in urine color    Past Medical History  Past Medical History:  Diagnosis Date  . Age-related nuclear cataract, right eye   . Allergy   . Anemia   . Arthritis    "joints sometimes" (12/25/2012)  . Clotting disorder ([place])   . Colon cancer ([place])   . Critical lower limb ischemia   . Diverticulosis   . Gangrene of toe, Rt second toe 12/25/2012  . GERD (gastroesophageal reflux disease)   . High cholesterol   . Hypertension   . Hypothyroidism   . PAD (peripheral artery disease) ([place])   . PAD (peripheral artery disease) ([place])   . Peripheral neuropathy   . Rectal cancer ([place])   . S/P arterial stent, 12/25/13, successful diamondback orbital rotational arthrectomy, PTA using chocolate  balloon and stenting using I DEV stent of long segment calcified high-grade proximal and mid r 12/25/2012  . Tobacco  abuse   . Type II diabetes mellitus ([place])    Past Surgical History  Past Surgical History:  Procedure Laterality Date  . ABDOMINAL AORTAGRAM  12/20/2012   Procedure: ABDOMINAL AORTAGRAM;  Surgeon: Lorretta Harp, MD;  Location: [facility] CATH LAB;  Service: Cardiovascular;;  . ABDOMINAL HYSTERECTOMY  2000  . ANGIOPLASTY / STENTING FEMORAL Right 12/25/2012  . ATHERECTOMY Right 12/25/2012   Procedure: ATHERECTOMY;  Surgeon: Lorretta Harp, MD;  Location: [facility] CATH LAB;  Service: Cardiovascular;  Laterality: Right;  right SFA  . COLONOSCOPY    . DIVERTING ILEOSTOMY N/A 08/03/2018   Procedure: DIVERTING LOOP ILEOSTOMY;  Surgeon: Leighton Ruff, MD;  Location: [facility] ORS;  Service: General;  Laterality: N/A;  . ESOPHAGOGASTRODUODENOSCOPY N/A 10/04/2018   Procedure: ESOPHAGOGASTRODUODENOSCOPY (EGD);  Surgeon: Carol Ada, MD;  Location: [facility] ENDOSCOPY;  Service: Endoscopy;  Laterality: N/A;  . EYE SURGERY Left    cataract  . HERNIA REPAIR    . LOWER EXTREMITY ANGIOGRAM N/A 12/20/2012   Procedure: LOWER EXTREMITY ANGIOGRAM;  Surgeon: Lorretta Harp, MD;  Location: [facility] CATH LAB;  Service: Cardiovascular;  Laterality: N/A;  . LOWER EXTREMITY ANGIOGRAM N/A 12/25/2012   Procedure: LOWER EXTREMITY ANGIOGRAM;  Surgeon: Lorretta Harp, MD;  Location: [facility]  CATH LAB;  Service: Cardiovascular;  Laterality: N/A;  . LOWER EXTREMITY INTERVENTION  05/08/2017  . LOWER EXTREMITY INTERVENTION Bilateral 05/08/2017   Procedure: LOWER EXTREMITY INTERVENTION;  Surgeon: Lorretta Harp, MD;  Location: [place] CV LAB;  Service: Cardiovascular;  Laterality: Bilateral;  . PERIPHERAL VASCULAR BALLOON ANGIOPLASTY Right 05/08/2017   Procedure: PERIPHERAL VASCULAR BALLOON ANGIOPLASTY;  Surgeon: Lorretta Harp, MD;  Location: [place] CV LAB;  Service: Cardiovascular;  Laterality: Right;  sfa  . PERIPHERAL VASCULAR INTERVENTION Right 05/08/2017   Procedure: PERIPHERAL VASCULAR INTERVENTION;  Surgeon: Lorretta Harp, MD;   Location: [place] CV LAB;  Service: Cardiovascular;  Laterality: Right;  ext iliac  . PORTACATH PLACEMENT Right 11/22/2017   Procedure: INSERTION PORT-A-CATH;  Surgeon: Leighton Ruff, MD;  Location: [facility] ORS;  Service: General;  Laterality: Right;  . SAVORY DILATION N/A 10/04/2018   Procedure: SAVORY DILATION;  Surgeon: Carol Ada, MD;  Location: [facility] ENDOSCOPY;  Service: Endoscopy;  Laterality: N/A;  . TOE SURGERY Right    2n toe   . UMBILICAL HERNIA REPAIR  2000  . UMBILICAL HERNIA REPAIR N/A 08/03/2018   Procedure: UMBILICAL HERNIA REPAIR;  Surgeon: Leighton Ruff, MD;  Location: [facility] ORS;  Service: General;  Laterality: N/A;  . XI ROBOTIC ASSISTED LOWER ANTERIOR RESECTION N/A 08/03/2018   Procedure: XI ROBOTIC ASSISTED LOWER ANTERIOR RESECTION; DIAGNOSTIC FLEXIBLE SIGMOIDOSCOPY; INTRAOPERATIVE ASSESSMENT OF VASCULAR PERFUSION;  Surgeon: Leighton Ruff, MD;  Location: [facility] ORS;  Service: General;  Laterality: N/A;   Family History  Family History  Problem Relation Age of Onset  . Hypertension Mother   . Stroke Mother   . Cancer Father        prostate  . Mental illness Sister   . Diabetes Brother   . Hypertension Brother   . Cancer Brother 62       prostate  . Diabetes Brother   . Hypertension Brother   . Cancer Brother        "tumors on neck and body"    Social History  reports that she has quit smoking. Her smoking use included cigarettes. She has a 24.00 pack-year smoking history. She has never used smokeless tobacco. She reports that she does not drink alcohol or use drugs. Allergies  Allergies  Allergen Reactions  . Kiwi Extract Itching and Other (See Comments)    Throat itching   . Lisinopril Other (See Comments)    Bruising   . Plavix [Clopidogrel] Other (See Comments)    "Made me feel badly- didn't agree with my system"   Home medications Prior to Admission medications   Medication Sig Start Date End Date Taking? Authorizing Provider  albuterol (PROVENTIL  HFA;VENTOLIN HFA) 108 (90 BASE) MCG/ACT inhaler Inhale 2 puffs into the lungs every 6 (six) hours as needed for wheezing or shortness of breath.   Yes [provider]  aspirin 81 MG tablet Take 81 mg by mouth daily.   Yes [provider]  cetirizine (ZYRTEC) 10 MG tablet Take 10 mg by mouth daily.   Yes [provider]  FERREX 150 150 MG capsule Take 150 mg by mouth 2 (two) times daily. 08/29/17  Yes [provider]  levothyroxine (SYNTHROID, LEVOTHROID) 50 MCG tablet Take 1 tablet (50 mcg total) by mouth daily. 01/01/18  Yes Minette Brine, FNP  Misc Natural Products (OSTEO BI-FLEX ADV TRIPLE ST) TABS Take 1 tablet by mouth 2 (two) times daily.   Yes [provider]  Multiple Vitamins-Minerals (SENIOR MULTIVITAMIN PLUS  PO) Take 1 tablet by mouth daily with breakfast.   Yes [provider]  omeprazole (PRILOSEC OTC) 20 MG tablet Take 20 mg by mouth daily before breakfast.   Yes [provider]  Psyllium (METAMUCIL FIBER PO) Take 1 Scoop by mouth See admin instructions. Mix 1 scoopful into 6-8 ounces of desired beverage and drink two times a day   Yes [provider]  tetrahydrozoline-zinc (VISINE-AC) 0.05-0.25 % ophthalmic solution Place 1 drop into both eyes 2 (two) times daily as needed (dry eyes).   Yes [provider]  Triamcinolone Acetonide (NASACORT ALLERGY 24HR NA) Place 1 spray into both nostrils daily as needed (allergies).    Yes [provider]  Trolamine Salicylate (ASPERCREME EX) Apply 1 application topically daily as needed (to affected areas, for joint pain).    Yes [provider]  acetaminophen (TYLENOL) 500 MG tablet Take 500 mg by mouth 2 (two) times daily as needed for mild pain, moderate pain or headache.     [provider]  amLODipine (NORVASC) 10 MG tablet Take 1 tablet (10 mg total) by mouth daily. Patient not taking: Reported on 03/29/2019 10/06/18   Raiford Noble Latif, DO   atorvastatin (LIPITOR) 10 MG tablet Take 1 tablet (10 mg total) by mouth daily. Patient taking differently: Take 10 mg by mouth at bedtime.  01/01/18   Minette Brine, FNP  Calcium Carbonate-Vitamin D (CALCIUM 600+D PO) Take 1 tablet by mouth daily.    [provider]  Cholecalciferol (VITAMIN D3) 50 MCG (2000 UT) TABS Take 4,000 Units by mouth daily.    [provider]  gabapentin (NEURONTIN) 300 MG capsule Take 300 mg by mouth 2 (two) times daily.     [provider]  glimepiride (AMARYL) 1 MG tablet Take 1 mg by mouth 2 (two) times daily.    [provider]  metFORMIN (GLUCOPHAGE-XR) 500 MG 24 hr tablet TAKE 2 TABLETS BY MOUTH  TWICE A DAY Patient taking differently: Take 1,000 mg by mouth 2 (two) times daily with a meal.  12/27/17   Minette Brine, FNP  triamterene-hydrochlorothiazide (MAXZIDE) 75-50 MG tablet Take 1 tablet by mouth daily. 03/11/19   [provider]  vitamin B-12 (CYANOCOBALAMIN) 500 MCG tablet Take 500 mcg by mouth daily.    [provider]     Vitals:   03/29/19 2343 03/30/19 0020 03/30/19 0352 03/30/19 1257  BP: (!) 156/67 (!) 145/62 (!) 145/63 (!) 153/67  Pulse: 90 81 81 76  Resp: '16 16 18 17  '$ Temp:  98.3 F (36.8 C) 98.2 F (36.8 C)   TempSrc:  Oral Oral   SpO2: 100% 100% 100% 99%  Weight:      Height:       Exam Gen no distress, calm, elderly AAF No rash, cyanosis or gangrene Sclera anicteric, throat clear No jvd or bruits, flat neck veins Chest clear bilat to bases, no rales, wheezing or bronchial BS RRR no MRG  Abd soft ntnd no mass or ascites +bs GU defer MS no joint effusions or deformity Ext bilat L > R lower leg edema 2+ Neuro is alert, Ox 3 , nf, no asterixis    Home meds:  - metformin 1gm bid/ glimepiride '1mg'$  bid  - norvasc 10/ triamterene-hctz 75-50 qd  - neurontin 300 bid  - prilosec 40 qd/ synthroid 50ug qd  - aspirin 81/ lipitor 10  - prn's/ vitamins/ supplements     Date   Creat  eGFR  2011- 07/2018  0.8- 1.1   08/2018  1.5 >> 0.9 AKI mild   09/2018  4.21 >> 1.05 AKI severe, 12 > 60   11/2018  2.14  26 ml/min   Today  4.60   10 ml/ min         July 2020 - rectal CA pt admitted for Robotic LAR , diverting ostomy and hernia repair   Aug 2020 - admit for dehydration and FTT, found to have postop SBO, NG placed and TPN given, she improved and GI tract functioned properly. [place] home.    Sept 2020 - admit for dehydration/ AKI, esophagitis/ dysphagia, creat improved 4.2 > 1.0 at dc; esophagitis rx w/ IV then po PPI and empiric diflucan w/ GI f/u. Hx of CVA w/ R side weakness, chornic    UA 09/2018    20-50 wbc, 6-10 rbc, rare bact, neg protein      09/2018 - UNa <10, UCr 368   UA 03/30/19  6-10 wbc, 0-5 rbc, rare bact, neg protein      03/30/19 - UNa 12, UCr 102    CT abd noncon 3/20 > 1) Adrenals/Urinary Tract: bilateral nonobstructive nephrolithiasis is noted. No hydronephrosis or renal obstruction is noted. Urinary bladder is unremarkable. 2) ileostomy RUQ  3) no acute abd /pelvis     Renal US - 9.0/ 9.1 cm kidneys w/o hydro, normal echo     CXR - not done    Assessment/ Plan: 1. Renal failure - baseline creat from sept- nov 2020 is 1.0- 2.0.  Pt has had AKI episodes from dehydration/ high output ostomy in 2020 after her cancer surgery. At this time does not appear vol depleted, has sig LE edema but some of this could be DVT related. UA and renal US unremarkable. Not sure cause. Consider RV thrombosis w/ hx cancer/ DVT. Get CXR, cont supportive care for now, I/O's and daily wts. No evidence of CHF will hold on diuretics for now, await LE doppler results.  2. LE edema - w/ hx of untreated DVT from late 2020, vs vol overload. Repeat dopplers are pending. Empiric IV heparin.  3. Rectal CA - sp chemo 2019-20 and LAR/ ileostomy in mid 2020 4. HTN -  BP's up slightly  5. DM2 - on po meds at home 6. Smoker 7. PAD - sp LE revasc procedure      Kelly Splinter   MD 03/30/2019, 5:00 PM  Recent Labs  Lab 03/29/19 1953 03/30/19 0346  WBC 3.9* 3.8*  HGB 7.8* 7.6*   Recent Labs  Lab 03/29/19 1953 03/30/19 0346  K 5.1 4.7  BUN 65* 61*  CREATININE 5.04* 4.60*  CALCIUM 9.6 9.3

## 2019-03-30 NOTE — Plan of Care (Signed)
  Problem: Education: Goal: Knowledge of General Education information will improve Description: Including pain rating scale, medication(s)/side effects and non-pharmacologic comfort measures Outcome: Progressing   Problem: Health Behavior/Discharge Planning: Goal: Ability to manage health-related needs will improve Outcome: Progressing   Problem: Clinical Measurements: Goal: Diagnostic test results will improve Outcome: Progressing Goal: Respiratory complications will improve Outcome: Progressing Goal: Cardiovascular complication will be avoided Outcome: Progressing   Problem: Activity: Goal: Risk for activity intolerance will decrease Outcome: Progressing   Problem: Nutrition: Goal: Adequate nutrition will be maintained Outcome: Progressing   Problem: Coping: Goal: Level of anxiety will decrease Outcome: Progressing   Problem: Pain Managment: Goal: General experience of comfort will improve Outcome: Progressing   Problem: Safety: Goal: Ability to remain free from injury will improve Outcome: Progressing   Problem: Skin Integrity: Goal: Risk for impaired skin integrity will decrease Outcome: Progressing

## 2019-03-30 NOTE — Progress Notes (Signed)
[place] for Heparin Indication: DVT   Allergies  Allergen Reactions  . Kiwi Extract Itching and Other (See Comments)    Throat itching   . Lisinopril Other (See Comments)    Bruising   . Plavix [Clopidogrel] Other (See Comments)    "Made me feel badly- didn't agree with my system"    Patient Measurements: Height: 5' 6.5" (168.9 cm) Weight: 160 lb (72.6 kg) IBW/kg (Calculated) : 60.45 Heparin Dosing Weight: 72 kg  Vital Signs: Temp: 98.2 F (36.8 C) (03/20 0352) Temp Source: Oral (03/20 0352) BP: 145/63 (03/20 0352) Pulse Rate: 81 (03/20 0352)  Labs: Recent Labs    03/29/19 1953 03/30/19 0346  HGB 7.8* 7.6*  HCT 25.0* 23.6*  PLT 338 324  HEPARINUNFRC  --  0.97*  CREATININE 5.04* 4.60*    Estimated Creatinine Clearance: 11.6 mL/min (A) (by C-G formula based on SCr of 4.6 mg/dL (H)).   Assessment: 69 y.o. F presents with leg swelling. 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 To begin heparin per pharmacy. Pt with AKI on CKD- SCr up to 5.04 (was 2.14 ~4 mos ago). No AC PTA. Hgb 7.8 on admission (has been ~8 past few months).  Heparin level 0.97 (supratherapeutic) but drawn only ~4h post bolus so likely bolus still affecting. No bleeding noted.  Goal of Therapy:  Heparin level 0.3-0.7 units/ml Monitor platelets by anticoagulation protocol: Yes   Plan:  Continue heparin gtt at 1000 units/hr Reorder heparin level for 0900 (8 hours post bolus/gtt start)  Sherlon Handing, PharmD, BCPS Please see amion for complete clinical pharmacist phone list 03/30/2019,5:05 AM

## 2019-03-30 NOTE — Progress Notes (Signed)
PROGRESS NOTE    CANDELA KRUL  SWN:[id number] DOB: 09/11/1950 DOA: 03/29/2019 PCP: Reynold Bowen, MD    Brief Narrative:  Nicole Sellers is a 69 year old African-American female with past medical history remarkable for stage III rectal cancer diagnosed 2019 status post chemotherapy/resection, PAD, T2DM, peripheral neuropathy, DVT Dx 11/2018 untreated who presented to the ED at the request of her PCP for abnormal labs with an elevated creatinine level.  Patient diagnosed with DVT in November 2020 has apparently not been treated, has been lost to follow-up in regards to general surgery and oncology.  Also patient follows with cardiology, Dr. Gwenlyn Found in regards to her peripheral artery disease.  Recent ultrasound arterial Dopplers notable for occluded left SFA stent, in which patient has outpatient appointment scheduled on 04/01/2019 with Dr. Gwenlyn Found.  In the ED, creatinine was noted to be 5.04; which is up from 2.1 in November 2020 and 1.0 in September 2020.  Hemoglobin 7.8.  Renal ultrasound suboptimal view of left kidney but otherwise unremarkable with no obstruction/masses appreciated.  Given her acute renal failure, hospital service was consulted for admission and further evaluation and work-up.   Assessment & Plan:   Principal Problem:   AKI (acute kidney injury) ([place]) Active Problems:   Essential hypertension   Non-insulin treated type 2 diabetes mellitus ([place])   PAD (peripheral artery disease) Rt ABI 0.7, Lt ABI 0.46   Rectal cancer ([facility])   Acute kidney injury ([place])   DVT (deep venous thrombosis) (H[facility])   Acute renal failure Creatinine elevated to 5.01 on admission, up from 2.14 11/2018 and 1.05 09/2018.  Renal ultrasound with suboptimal views of left kidney but otherwise unremarkable without findings of obstruction or mass.  Is unremarkable. FeNa = 0.4% consistent with prerenal azotemia, although on diuretic therapy with Maxide which could alter result. --Nephrology consult:  Pending --Cr 5.04-->4.60 --Check urine urea to eval FEUrea --CT Abd/pelvis w/o contrast: Pending --Echocardiogram: Pending --Holding home Maxide, Metformin, glimepiride --Avoid nephrotoxins, renally dose all medications --Follow renal function daily  DVT Patient originally diagnosed with DVT right popliteal vein, right peroneal veins with age-indeterminate DVT right tibioperoneal trunk in November 2020.  Apparently has not been treated with anticoagulation.  Continues with bilateral lower extremity edema. --Venous duplex ultrasound bilateral lower extremities: Pending --Continue heparin drip  Essential hypertension Patient on triamterene-HCTZ 75-50 mg p.o. daily at home. --Holding for acute kidney failure as above --Hydralazine '25mg'$  PO q8h prn for SBP >170  Peripheral arterial disease Follows with cardiology, Dr. Gwenlyn Found outpatient.  Recent arterial vascular ultrasound 03/25/2019 with occluded left SFA stent, bilateral occlusion peroneal artery.  Discussed case with Dr. Gwenlyn Found, without critical limb ischemia or ulcerations, no indication for acute intervention at this time recommend continued outpatient follow-up.  Patient has follow-up scheduled in cardiology clinic with Dr. Gwenlyn Found on 3/22. --Continue aspirin 81 mg p.o. daily  Type 2 diabetes mellitus Hemoglobin A1c 5.4, well controlled.  On Metformin 1000 mg twice daily, and glimepiride 1 mg p.o. twice daily at home. --will hold oral hypoglycemics in the setting of acute renal failure as above --Insulin sliding scale for further coverage  GERD: Continue PPI  Hypothyroidism: Continue levothyroxine 50 mcg p.o. daily  Iron deficiency anemia: Continue iron supplementation  HLD: Continue atorvastatin 10 mg p.o. daily  Peripheral neuropathy: Continue gabapentin 300 mg p.o. twice daily  DVT prophylaxis: Heparin drip Code Status: Full code Family Communication: Updated patient extensively at bedside Disposition Plan:      Patient is  from: Home  Anticipated Disposition:  Home     Barriers to discharge or conditions that needs to be met prior to discharge: Improvement of renal function back to baseline  Consultants:   Nephrology - Dr. Jonnie Finner  Cardiology - Discussed with Dr. Gwenlyn Found on 3/20  Procedures:   Echocardiogram: Pending  Antimicrobials:   None   Subjective: Patient seen and examined bedside, resting comfortably.  Complaining of right IV site irritation, IV team present currently changing IV catheter.  Patient poor historian, unclear why she has not followed up for her previously diagnosed DVT in November 2020, states has not been treated with anticoagulation.  Also has been lost to follow-up in regards to her rectal cancer with general surgery and oncology.  Complains of bilateral lower extreme edema, which has been present for some time per her report.  No other specific complaints at this time.  Denies headache, no visual changes, no chest pain, palpitations, no shortness of breath, no abdominal pain, no weakness, no fatigue.  No acute events overnight per nursing staff.  Objective: Vitals:   03/29/19 1937 03/29/19 2343 03/30/19 0020 03/30/19 0352  BP: (!) 144/64 (!) 156/67 (!) 145/62 (!) 145/63  Pulse: 94 90 81 81  Resp: '18 16 16 18  '$ Temp: 98.7 F (37.1 C)  98.3 F (36.8 C) 98.2 F (36.8 C)  TempSrc: Oral  Oral Oral  SpO2: 100% 100% 100% 100%  Weight: 72.6 kg     Height: 5' 6.5" (1.689 m)       Intake/Output Summary (Last 24 hours) at 03/30/2019 1132 Last data filed at 03/30/2019 0900 Gross per 24 hour  Intake 1301.33 ml  Output 500 ml  Net 801.33 ml   Filed Weights   03/29/19 1937  Weight: 72.6 kg    Examination:  General exam: Appears calm and comfortable  Respiratory system: Clear to auscultation. Respiratory effort normal.  Oxygenating well on room air Cardiovascular system: S1 & S2 heard, RRR. No JVD, murmurs, rubs, gallops or clicks.  3+ pedal edema bilaterally to level of  knee Gastrointestinal system: Abdomen is nondistended, soft and nontender. No organomegaly or masses felt. Normal bowel sounds heard. Central nervous system: Alert and oriented. No focal neurological deficits. Extremities: Symmetric 5 x 5 power. Skin: No rashes, lesions or ulcers Psychiatry: Judgement and insight appear poor. Mood & affect appropriate.     Data Reviewed: I have personally reviewed following labs and imaging studies  CBC: Recent Labs  Lab 03/29/19 1953 03/30/19 0346  WBC 3.9* 3.8*  HGB 7.8* 7.6*  HCT 25.0* 23.6*  MCV 98.0 98.3  PLT 338 748   Basic Metabolic Panel: Recent Labs  Lab 03/29/19 1953 03/30/19 0346  NA 137 135  K 5.1 4.7  CL 105 105  CO2 20* 20*  GLUCOSE 146* 192*  BUN 65* 61*  CREATININE 5.04* 4.60*  CALCIUM 9.6 9.3   GFR: Estimated Creatinine Clearance: 11.6 mL/min (A) (by C-G formula based on SCr of 4.6 mg/dL (H)). Liver Function Tests: No results for input(s): AST, ALT, ALKPHOS, BILITOT, PROT, ALBUMIN in the last 168 hours. No results for input(s): LIPASE, AMYLASE in the last 168 hours. No results for input(s): AMMONIA in the last 168 hours. Coagulation Profile: No results for input(s): INR, PROTIME in the last 168 hours. Cardiac Enzymes: No results for input(s): CKTOTAL, CKMB, CKMBINDEX, TROPONINI in the last 168 hours. BNP (last 3 results) No results for input(s): PROBNP in the last 8760 hours. HbA1C: Recent Labs    03/29/19 1953  HGBA1C  5.4   CBG: Recent Labs  Lab 03/30/19 0010 03/30/19 0645  GLUCAP 116* 82   Lipid Profile: No results for input(s): CHOL, HDL, LDLCALC, TRIG, CHOLHDL, LDLDIRECT in the last 72 hours. Thyroid Function Tests: No results for input(s): TSH, T4TOTAL, FREET4, T3FREE, THYROIDAB in the last 72 hours. Anemia Panel: No results for input(s): VITAMINB12, FOLATE, FERRITIN, TIBC, IRON, RETICCTPCT in the last 72 hours. Sepsis Labs: No results for input(s): PROCALCITON, LATICACIDVEN in the last 168  hours.  Recent Results (from the past 240 hour(s))  SARS CORONAVIRUS 2 (TAT 6-24 HRS) Nasopharyngeal Nasopharyngeal Swab     Status: None   Collection Time: 03/29/19 10:38 PM   Specimen: Nasopharyngeal Swab  Result Value Ref Range Status   SARS Coronavirus 2 NEGATIVE NEGATIVE Final    Comment: (NOTE) SARS-CoV-2 target nucleic acids are NOT DETECTED. The SARS-CoV-2 RNA is generally detectable in upper and lower respiratory specimens during the acute phase of infection. Negative results do not preclude SARS-CoV-2 infection, do not rule out co-infections with other pathogens, and should not be used as the sole basis for treatment or other patient management decisions. Negative results must be combined with clinical observations, patient history, and epidemiological information. The expected result is Negative. Fact Sheet for Patients: SugarRoll.be Fact Sheet for Healthcare Providers: https://www.woods-mathews.com/ This test is not yet approved or cleared by the [place] FDA and  has been authorized for detection and/or diagnosis of SARS-CoV-2 by FDA under an Emergency Use Authorization (EUA). This EUA will remain  in effect (meaning this test can be used) for the duration of the COVID-19 declaration under Section 56 4(b)(1) of the Act, 21 U.S.C. section 360bbb-3(b)(1), unless the authorization is terminated or revoked sooner. Performed at [facility], [place] [address] [place], [place] [address]          Radiology Studies: US Renal  Result Date: 03/29/2019 CLINICAL DATA:  Acute kidney injury, elevated BUN and creatinine EXAM: RENAL / URINARY TRACT ULTRASOUND COMPLETE COMPARISON:  Prior ultrasound 10/02/2018, CT abdomen pelvis 01/17/2018 FINDINGS: Right Kidney: Renal measurements: 9.1 x 4.4 x 6.7 cm = volume: 131.9 mL . Echogenicity within normal limits. No mass, shadowing calculus or hydronephrosis. Visualized. Left Kidney: Renal  measurements: 9.0 x 4.6 x 5.2 cm = volume: 11.2 mL. Suboptimal visualization secondary to bowel gas. Echogenicity within normal limits. No visible mass, shadowing calculus or hydronephrosis within the visible portions. Bladder: Appears normal for degree of bladder distention. Other: None. IMPRESSION: Suboptimal visualization of the left kidney due to overlying bowel gas. Grossly unremarkable renal ultrasound within the limitations of the exam. Electronically Signed   By: Lovena Le M.D.   On: 03/29/2019 23:27        Scheduled Meds: . aspirin  81 mg Oral Daily  . Chlorhexidine Gluconate Cloth  6 each Topical Daily  . gabapentin  300 mg Oral BID  . insulin aspart  0-5 Units Subcutaneous QHS  . insulin aspart  0-9 Units Subcutaneous TID WC  . iohexol  500 mL Oral Q1H  . iron polysaccharides  150 mg Oral BID  . levothyroxine  50 mcg Oral Q0600  . pantoprazole  40 mg Oral QAC breakfast  . sodium chloride flush  10-40 mL Intracatheter Q12H   Continuous Infusions: . heparin 850 Units/hr (03/30/19 1026)     LOS: 1 day    Time spent: 37 minutes spent on chart review, discussion with nursing staff, consultants, updating family and interview/physical exam; more than 50% of that time  was spent in counseling and/or coordination of care.    Kammy Klett J [place] DO [facility] Available via Epic secure chat 7am-7pm After these hours, please refer to coverage provider listed on [email or web address] 03/30/2019, 11:32 AM

## 2019-03-31 ENCOUNTER — Inpatient Hospital Stay (HOSPITAL_COMMUNITY): Payer: Medicare Other

## 2019-03-31 DIAGNOSIS — R600 Localized edema: Secondary | ICD-10-CM

## 2019-03-31 LAB — CBC
HCT: 22.8 % — ABNORMAL LOW (ref 36.0–46.0)
Hemoglobin: 7 g/dL — ABNORMAL LOW (ref 12.0–15.0)
MCH: 30.3 pg (ref 26.0–34.0)
MCHC: 30.7 g/dL (ref 30.0–36.0)
MCV: 98.7 fL (ref 80.0–100.0)
Platelets: 313 10*3/uL (ref 150–400)
RBC: 2.31 MIL/uL — ABNORMAL LOW (ref 3.87–5.11)
RDW: 13.4 % (ref 11.5–15.5)
WBC: 3.1 10*3/uL — ABNORMAL LOW (ref 4.0–10.5)
nRBC: 0 % (ref 0.0–0.2)

## 2019-03-31 LAB — HEPARIN LEVEL (UNFRACTIONATED)
Heparin Unfractionated: 0.74 IU/mL — ABNORMAL HIGH (ref 0.30–0.70)
Heparin Unfractionated: 0.62 IU/mL (ref 0.30–0.70)
Heparin Unfractionated: 0.54 IU/mL (ref 0.30–0.70)

## 2019-03-31 LAB — BASIC METABOLIC PANEL
Anion gap: 7 (ref 5–15)
BUN: 52 mg/dL — ABNORMAL HIGH (ref 8–23)
CO2: 19 mmol/L — ABNORMAL LOW (ref 22–32)
Calcium: 9 mg/dL (ref 8.9–10.3)
Chloride: 110 mmol/L (ref 98–111)
Creatinine, Ser: 3.72 mg/dL — ABNORMAL HIGH (ref 0.44–1.00)
GFR calc Af Amer: 13 mL/min — ABNORMAL LOW (ref >60)
GFR calc non Af Amer: 12 mL/min — ABNORMAL LOW (ref >60)
Glucose, Bld: 102 mg/dL — ABNORMAL HIGH (ref 70–99)
Potassium: 4.7 mmol/L (ref 3.5–5.1)
Sodium: 136 mmol/L (ref 135–145)

## 2019-03-31 LAB — VITAMIN B12: Vitamin B-12: 193 pg/mL (ref 180–914)

## 2019-03-31 LAB — IRON AND TIBC
Iron: 220 ug/dL — ABNORMAL HIGH (ref 28–170)
Saturation Ratios: 91 % — ABNORMAL HIGH (ref 10.4–31.8)
TIBC: 241 ug/dL — ABNORMAL LOW (ref 250–450)
UIBC: 21 ug/dL

## 2019-03-31 LAB — GLUCOSE, CAPILLARY
Glucose-Capillary: 95 mg/dL (ref 70–99)
Glucose-Capillary: 141 mg/dL — ABNORMAL HIGH (ref 70–99)
Glucose-Capillary: 157 mg/dL — ABNORMAL HIGH (ref 70–99)
Glucose-Capillary: 91 mg/dL (ref 70–99)

## 2019-03-31 LAB — FERRITIN: Ferritin: 417 ng/mL — ABNORMAL HIGH (ref 11–307)

## 2019-03-31 LAB — ECHOCARDIOGRAM COMPLETE
Height: 66.5 in
Weight: 2423.3 oz

## 2019-03-31 LAB — UREA NITROGEN, URINE: Urea Nitrogen, Ur: 496 mg/dL

## 2019-03-31 MED ORDER — HEPARIN (PORCINE) 25000 UT/250ML-% IV SOLN
700.0000 [IU]/h | INTRAVENOUS | Status: DC
  Administered 2019-04-01 – 2019-04-03 (×2): 700 [IU]/h via INTRAVENOUS
  Filled 2019-03-31 (×2): qty 250

## 2019-03-31 MED ORDER — TRAMADOL HCL 50 MG PO TABS
50.0000 mg | ORAL_TABLET | Freq: Once | ORAL | Status: AC
  Administered 2019-03-31: 50 mg via ORAL
  Filled 2019-03-31: qty 1

## 2019-03-31 NOTE — Procedures (Signed)
Echo attempted. Patient eating. Will attempt again. 

## 2019-03-31 NOTE — Progress Notes (Signed)
ANTICOAGULATION CONSULT NOTE - Follow Up Consult  Pharmacy Consult for heparin Indication: DVT  Labs: Recent Labs    03/29/19 1953 03/29/19 1953 03/30/19 0346 03/30/19 0905 03/31/19 0338 03/31/19 1416 03/31/19 2219  HGB 7.8*   < > 7.6*  --  7.0*  --   --   HCT 25.0*  --  23.6*  --  22.8*  --   --   PLT 338  --  324  --  313  --   --   HEPARINUNFRC  --   --  0.97*   < > 0.74* 0.62 0.54  CREATININE 5.04*  --  4.60*  --  3.72*  --   --    < > = values in this interval not displayed.    Assessment/Plan:  69yo female remains therapeutic on heparin. Will continue gtt at current rate and monitor level daily.   Wynona Neat, PharmD, BCPS  03/31/2019,10:56 PM

## 2019-03-31 NOTE — Progress Notes (Signed)
ANTICOAGULATION CONSULT NOTE - Follow Up Consult  Pharmacy Consult for heparin Indication: DVT  Labs: Recent Labs    03/29/19 1953 03/29/19 1953 03/30/19 0346 03/30/19 0346 03/30/19 0905 03/30/19 2019 03/31/19 0338  HGB 7.8*   < > 7.6*  --   --   --  7.0*  HCT 25.0*  --  23.6*  --   --   --  22.8*  PLT 338  --  324  --   --   --  313  HEPARINUNFRC  --   --  0.97*   < > 0.86* 0.94* 0.74*  CREATININE 5.04*  --  4.60*  --   --   --  3.72*   < > = values in this interval not displayed.    Assessment: 68yo female remains slightly supratherapeutic on heparin after rate change; no gtt issues or signs of bleeding per RN; Hgb low but stable.  Goal of Therapy:  Heparin level 0.3-0.7 units/ml   Plan:  Will decrease heparin gtt slightly to 700 units/hr and check level in 8 hours.    Wynona Neat, PharmD, BCPS  03/31/2019,5:48 AM

## 2019-03-31 NOTE — Progress Notes (Signed)
PROGRESS NOTE    Nicole Sellers  TMH:[id number] DOB: Dec 28, 1950 DOA: 03/29/2019 PCP: Reynold Bowen, MD    Brief Narrative:  Nicole Sellers is a 69 year old African-American female with past medical history remarkable for stage III rectal cancer diagnosed 2019 status post chemotherapy/resection, PAD, T2DM, peripheral neuropathy, DVT Dx 11/2018 untreated who presented to the ED at the request of her PCP for abnormal labs with an elevated creatinine level.  Patient diagnosed with DVT in November 2020 has apparently not been treated, has been lost to follow-up in regards to general surgery and oncology.  Also patient follows with cardiology, Dr. Gwenlyn Found in regards to her peripheral artery disease.  Recent ultrasound arterial Dopplers notable for occluded left SFA stent, in which patient has outpatient appointment scheduled on 04/01/2019 with Dr. Gwenlyn Found.  In the ED, creatinine was noted to be 5.04; which is up from 2.1 in November 2020 and 1.0 in September 2020.  Hemoglobin 7.8.  Renal ultrasound suboptimal view of left kidney but otherwise unremarkable with no obstruction/masses appreciated.  Given her acute renal failure, hospital service was consulted for admission and further evaluation and work-up.   Assessment & Plan:   Principal Problem:   AKI (acute kidney injury) ([place]) Active Problems:   Essential hypertension   Non-insulin treated type 2 diabetes mellitus ([place])   PAD (peripheral artery disease) Rt ABI 0.7, Lt ABI 0.46   Rectal cancer (H[facility])   Acute kidney injury ([place])   DVT (deep venous thrombosis) (H[facility])   Acute renal failure Creatinine elevated to 5.01 on admission, up from 2.14 11/2018 and 1.05 09/2018.  Renal ultrasound with suboptimal views of left kidney but otherwise unremarkable without findings of obstruction or mass.  Is unremarkable. FeNa = 0.4% consistent with prerenal azotemia, although on diuretic therapy with Maxide which could alter result. FeUrea 36.5%, which is  consistent with intrinsic renal disease.  CT abdomen/pelvis negative for hydronephrosis or urinary obstruction. --Nephrology consult: Pending --Cr 5.04-->4.60-->3.72 --Check urine urea to eval FEUrea --Echocardiogram: Pending --Holding home Maxide, Metformin, glimepiride --Avoid nephrotoxins, renally dose all medications --Follow renal function daily  DVT Patient originally diagnosed with DVT right popliteal vein, right peroneal veins with age-indeterminate DVT right tibioperoneal trunk in November 2020.  Apparently has not been treated with anticoagulation.  Continues with bilateral lower extremity edema. Venous duplex ultrasound bilateral lower extremities positive for DVT right popliteal vein, right posterior tibial veins, right peroneal vein --Transition heparin drip to Eliquis  Essential hypertension Patient on triamterene-HCTZ 75-50 mg p.o. daily at home. --Holding for acute kidney failure as above --Hydralazine '25mg'$  PO q8h prn for SBP >170  Peripheral arterial disease Follows with cardiology, Dr. Gwenlyn Found outpatient.  Recent arterial vascular ultrasound 03/25/2019 with occluded left SFA stent, bilateral occlusion peroneal artery.  Discussed case with Dr. Gwenlyn Found, without critical limb ischemia or ulcerations, no indication for acute intervention at this time recommend continued outpatient follow-up.  Patient has follow-up scheduled in cardiology clinic with Dr. Gwenlyn Found on 3/22. --Continue aspirin 81 mg p.o. daily  Type 2 diabetes mellitus Hemoglobin A1c 5.4, well controlled.  On Metformin 1000 mg twice daily, and glimepiride 1 mg p.o. twice daily at home. --will hold oral hypoglycemics in the setting of acute renal failure as above --Insulin sliding scale for further coverage  GERD: Continue PPI  Hypothyroidism: Continue levothyroxine 50 mcg p.o. daily  Iron deficiency anemia: Continue iron supplementation.  Hemoglobin down to 7.0 today from 7.6.  Will obtain iron studies.  Patient  declines blood transfusion at this  time.  If iron levels low, will consider IV Feraheme infusion; which she is agreeable to.  HLD: Continue atorvastatin 10 mg p.o. daily  Peripheral neuropathy: Continue gabapentin 300 mg p.o. twice daily  DVT prophylaxis: Heparin drip Code Status: Full code Family Communication: Updated patient extensively at bedside Disposition Plan:      Patient is from: Home     Anticipated Disposition:  Home     Barriers to discharge or conditions that needs to be met prior to discharge: Improvement of renal function back to baseline  Consultants:   Nephrology - Dr. Jonnie Finner  Cardiology - Discussed with Dr. Gwenlyn Found on 3/20  Procedures:   Echocardiogram: Pending  Antimicrobials:   None   Subjective: Patient seen and examined bedside, resting comfortably.  Feeling better today, reports lower extremity edema improved.  Continues on heparin drip.  Renal function improving.  No other specific complaints at this time.  Denies headache, no visual changes, no chest pain, palpitations, no shortness of breath, no abdominal pain, no weakness, no fatigue.  No acute events overnight per nursing staff.  Objective: Vitals:   03/30/19 1952 03/31/19 0333 03/31/19 0500 03/31/19 0917  BP: (!) 152/64 (!) 141/68  (!) 153/84  Pulse: 79 81  70  Resp: '17 17  16  '$ Temp: 97.9 F (36.6 C)   98.3 F (36.8 C)  TempSrc: Oral   Oral  SpO2: 100% 99%  100%  Weight:   68.7 kg   Height:        Intake/Output Summary (Last 24 hours) at 03/31/2019 1131 Last data filed at 03/30/2019 1300 Gross per 24 hour  Intake 240 ml  Output --  Net 240 ml   Filed Weights   03/29/19 1937 03/31/19 0500  Weight: 72.6 kg 68.7 kg    Examination:  General exam: Appears calm and comfortable  Respiratory system: Clear to auscultation. Respiratory effort normal.  Oxygenating well on room air Cardiovascular system: S1 & S2 heard, RRR. No JVD, murmurs, rubs, gallops or clicks.  3+ pedal edema  bilaterally to level of knee Gastrointestinal system: Abdomen is nondistended, soft and nontender. No organomegaly or masses felt. Normal bowel sounds heard. Central nervous system: Alert and oriented. No focal neurological deficits. Extremities: Symmetric 5 x 5 power. Skin: No rashes, lesions or ulcers Psychiatry: Judgement and insight appear poor. Mood & affect appropriate.     Data Reviewed: I have personally reviewed following labs and imaging studies  CBC: Recent Labs  Lab 03/29/19 1953 03/30/19 0346 03/31/19 0338  WBC 3.9* 3.8* 3.1*  HGB 7.8* 7.6* 7.0*  HCT 25.0* 23.6* 22.8*  MCV 98.0 98.3 98.7  PLT 338 324 938   Basic Metabolic Panel: Recent Labs  Lab 03/29/19 1953 03/30/19 0346 03/31/19 0338  NA 137 135 136  K 5.1 4.7 4.7  CL 105 105 110  CO2 20* 20* 19*  GLUCOSE 146* 192* 102*  BUN 65* 61* 52*  CREATININE 5.04* 4.60* 3.72*  CALCIUM 9.6 9.3 9.0   GFR: Estimated Creatinine Clearance: 13.2 mL/min (A) (by C-G formula based on SCr of 3.72 mg/dL (H)). Liver Function Tests: No results for input(s): AST, ALT, ALKPHOS, BILITOT, PROT, ALBUMIN in the last 168 hours. No results for input(s): LIPASE, AMYLASE in the last 168 hours. No results for input(s): AMMONIA in the last 168 hours. Coagulation Profile: No results for input(s): INR, PROTIME in the last 168 hours. Cardiac Enzymes: No results for input(s): CKTOTAL, CKMB, CKMBINDEX, TROPONINI in the last 168 hours. BNP (last 3  results) No results for input(s): PROBNP in the last 8760 hours. HbA1C: Recent Labs    03/29/19 1953  HGBA1C 5.4   CBG: Recent Labs  Lab 03/30/19 0645 03/30/19 1211 03/30/19 1623 03/30/19 1952 03/31/19 0627  GLUCAP 82 95 160* 112* 95   Lipid Profile: No results for input(s): CHOL, HDL, LDLCALC, TRIG, CHOLHDL, LDLDIRECT in the last 72 hours. Thyroid Function Tests: No results for input(s): TSH, T4TOTAL, FREET4, T3FREE, THYROIDAB in the last 72 hours. Anemia Panel: Recent Labs     03/31/19 0836  VITAMINB12 193  FERRITIN 417*  TIBC 241*  IRON 220*   Sepsis Labs: No results for input(s): PROCALCITON, LATICACIDVEN in the last 168 hours.  Recent Results (from the past 240 hour(s))  SARS CORONAVIRUS 2 (TAT 6-24 HRS) Nasopharyngeal Nasopharyngeal Swab     Status: None   Collection Time: 03/29/19 10:38 PM   Specimen: Nasopharyngeal Swab  Result Value Ref Range Status   SARS Coronavirus 2 NEGATIVE NEGATIVE Final    Comment: (NOTE) SARS-CoV-2 target nucleic acids are NOT DETECTED. The SARS-CoV-2 RNA is generally detectable in upper and lower respiratory specimens during the acute phase of infection. Negative results do not preclude SARS-CoV-2 infection, do not rule out co-infections with other pathogens, and should not be used as the sole basis for treatment or other patient management decisions. Negative results must be combined with clinical observations, patient history, and epidemiological information. The expected result is Negative. Fact Sheet for Patients: SugarRoll.be Fact Sheet for Healthcare Providers: https://www.woods-mathews.com/ This test is not yet approved or cleared by the [place] FDA and  has been authorized for detection and/or diagnosis of SARS-CoV-2 by FDA under an Emergency Use Authorization (EUA). This EUA will remain  in effect (meaning this test can be used) for the duration of the COVID-19 declaration under Section 56 4(b)(1) of the Act, 21 U.S.C. section 360bbb-3(b)(1), unless the authorization is terminated or revoked sooner. Performed at [facility], [place] [address], [place], [place] [address]          Radiology Studies: CT ABDOMEN PELVIS WO CONTRAST  Result Date: 03/30/2019 CLINICAL DATA:  Acute kidney injury. History of colorectal cancer. EXAM: CT ABDOMEN AND PELVIS WITHOUT CONTRAST TECHNIQUE: Multidetector CT imaging of the abdomen and pelvis was performed following the  standard protocol without IV contrast due to acute kidney injury. COMPARISON:  January 17, 2018. FINDINGS: Lower chest: Stable small subcentimeter nodules are noted. No acute abnormality seen in visualized lung bases. Hepatobiliary: No focal liver abnormality is seen. No gallstones, gallbladder wall thickening, or biliary dilatation. Pancreas: Unremarkable. No pancreatic ductal dilatation or surrounding inflammatory changes. Spleen: Normal in size without focal abnormality. Adrenals/Urinary Tract: Adrenal glands appear normal. Bilateral nonobstructive nephrolithiasis is noted. No hydronephrosis or renal obstruction is noted. Urinary bladder is unremarkable. Stomach/Bowel: Stomach is unremarkable. 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 There is no evidence of bowel obstruction or inflammation. The appendix appears normal. Sigmoid diverticulosis is noted without inflammation. Vascular/Lymphatic: Aortic atherosclerosis. No enlarged abdominal or pelvic lymph nodes. Reproductive: Status post hysterectomy. No adnexal masses. Other: No abdominal wall hernia or abnormality. No abdominopelvic ascites. Musculoskeletal: No acute or significant osseous findings. IMPRESSION: 1. Bilateral nonobstructive nephrolithiasis. No hydronephrosis or renal obstruction is noted. 2. 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 3. Sigmoid diverticulosis is noted without inflammation. 4. No acute abnormality seen in the abdomen or pelvis. Aortic Atherosclerosis (ICD10-I70.0). Electronically Signed   By: Marijo Conception M.D.   On: 03/30/2019 13:35  US Renal  Result Date: 03/29/2019 CLINICAL DATA:  Acute kidney injury, elevated BUN and creatinine EXAM: RENAL / URINARY TRACT ULTRASOUND COMPLETE COMPARISON:  Prior ultrasound 10/02/2018, CT abdomen pelvis 01/17/2018 FINDINGS: Right Kidney: Renal measurements: 9.1 x 4.4 x 6.7 cm = volume: 131.9 mL . Echogenicity within normal limits. No mass, shadowing calculus or hydronephrosis.  Visualized. Left Kidney: Renal measurements: 9.0 x 4.6 x 5.2 cm = volume: 11.2 mL. Suboptimal visualization secondary to bowel gas. Echogenicity within normal limits. No visible mass, shadowing calculus or hydronephrosis within the visible portions. Bladder: Appears normal for degree of bladder distention. Other: None. IMPRESSION: Suboptimal visualization of the left kidney due to overlying bowel gas. Grossly unremarkable renal ultrasound within the limitations of the exam. Electronically Signed   By: Lovena Le M.D.   On: 03/29/2019 23:27   DG CHEST PORT 1 VIEW  Result Date: 03/30/2019 CLINICAL DATA:  Acute kidney injury EXAM: PORTABLE CHEST 1 VIEW COMPARISON:  11/22/2017 FINDINGS: Right Port-A-Cath remains in place, unchanged. Heart is upper limits normal in size. No confluent opacities or effusions. No acute bony abnormality. IMPRESSION: No active disease. Electronically Signed   By: Rolm Baptise M.D.   On: 03/30/2019 22:49   VAS [place] LOWER EXTREMITY VENOUS (DVT)  Result Date: 03/30/2019  Lower Venous DVTStudy Indications: Edema, and History of untreated DVT in right Popliteal, posterior tibial, and peroneal veins, 11/15/18.  Risk Factors: Cancer Stage III rectal cancer 2019 Acute kidney injury, CKD. Comparison Study: Prior study from 11/15/18 is available for comparison Performing Technologist: Sharion Dove RVS  Examination Guidelines: A complete evaluation includes B-mode imaging, spectral Doppler, color Doppler, and power Doppler as needed of all accessible portions of each vessel. Bilateral testing is considered an integral part of a complete examination. Limited examinations for reoccurring indications may be performed as noted. The reflux portion of the exam is performed with the patient in reverse Trendelenburg.  +---------+---------------+---------+-----------+----------+-----------------+ RIGHT    CompressibilityPhasicitySpontaneityPropertiesThrombus Aging     +---------+---------------+---------+-----------+----------+-----------------+ CFV      Full           Yes      Yes                                    +---------+---------------+---------+-----------+----------+-----------------+ SFJ      Full                                                           +---------+---------------+---------+-----------+----------+-----------------+ FV Prox  Full                                                           +---------+---------------+---------+-----------+----------+-----------------+ FV Mid   Full                                                           +---------+---------------+---------+-----------+----------+-----------------+ FV DistalFull                                                           +---------+---------------+---------+-----------+----------+-----------------+  PFV      Full                                                           +---------+---------------+---------+-----------+----------+-----------------+ POP      Partial        Yes      Yes                  Age Indeterminate +---------+---------------+---------+-----------+----------+-----------------+ PTV      Partial                                      Chronic           +---------+---------------+---------+-----------+----------+-----------------+ PERO     Partial                                      Chronic           +---------+---------------+---------+-----------+----------+-----------------+   +---------+---------------+---------+-----------+----------+--------------+ LEFT     CompressibilityPhasicitySpontaneityPropertiesThrombus Aging +---------+---------------+---------+-----------+----------+--------------+ CFV      Full           Yes      Yes                                 +---------+---------------+---------+-----------+----------+--------------+ SFJ      Full                                                         +---------+---------------+---------+-----------+----------+--------------+ FV Prox  Full                                                        +---------+---------------+---------+-----------+----------+--------------+ FV Mid   Full                                                        +---------+---------------+---------+-----------+----------+--------------+ FV DistalFull                                                        +---------+---------------+---------+-----------+----------+--------------+ PFV      Full                                                        +---------+---------------+---------+-----------+----------+--------------+ POP      Full  Yes      Yes                                 +---------+---------------+---------+-----------+----------+--------------+ PTV      Full                                                        +---------+---------------+---------+-----------+----------+--------------+ PERO     Full                                                        +---------+---------------+---------+-----------+----------+--------------+     Summary: RIGHT: - Findings consistent with age indeterminate deep vein thrombosis involving the right popliteal vein. 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 Significant interstitial fluid - Ultrasound characteristics of enlarged lymph nodes are noted in the groin.  LEFT: - There is no evidence of deep vein thrombosis in the lower extremity.  Significant interstitial fluid.  *See table(s) above for measurements and observations.    Preliminary         Scheduled Meds: . aspirin  81 mg Oral Daily  . atorvastatin  10 mg Oral QHS  . Chlorhexidine Gluconate Cloth  6 each Topical Daily  . gabapentin  300 mg Oral BID  . insulin aspart  0-5 Units Subcutaneous QHS  . insulin aspart  0-9 Units  Subcutaneous TID WC  . iron polysaccharides  150 mg Oral BID  . levothyroxine  50 mcg Oral Q0600  . pantoprazole  40 mg Oral QAC breakfast  . sodium chloride flush  10-40 mL Intracatheter Q12H   Continuous Infusions: . heparin 700 Units/hr (03/31/19 0637)     LOS: 2 days    Time spent: 37 minutes spent on chart review, discussion with nursing staff, consultants, updating family and interview/physical exam; more than 50% of that time was spent in counseling and/or coordination of care.    Xavien Dauphinais J [place] DO [facility] Available via Epic secure chat 7am-7pm After these hours, please refer to coverage provider listed on [email or web address] 03/31/2019, 11:31 AM

## 2019-03-31 NOTE — Plan of Care (Signed)
  Problem: Clinical Measurements: Goal: Ability to maintain clinical measurements within normal limits will improve Outcome: Progressing Goal: Will remain free from infection Outcome: Progressing Goal: Diagnostic test results will improve Outcome: Progressing Goal: Respiratory complications will improve Outcome: Progressing Goal: Cardiovascular complication will be avoided Outcome: Progressing   Problem: Activity: Goal: Risk for activity intolerance will decrease Outcome: Progressing   Problem: Pain Managment: Goal: General experience of comfort will improve Outcome: Progressing   

## 2019-03-31 NOTE — Progress Notes (Signed)
[facility] Progress Note  Subjective: creat down 3.7 today , 100 cc recorded UOP yest  Vitals:   03/30/19 1952 03/31/19 0333 03/31/19 0500 03/31/19 0917  BP: (!) 152/64 (!) 141/68  (!) 153/84  Pulse: 79 81  70  Resp: 17 17  16  Temp: 97.9 F (36.6 C)   98.3 F (36.8 C)  TempSrc: Oral   Oral  SpO2: 100% 99%  100%  Weight:   68.7 kg   Height:        Exam: Gen no distress, calm, elderly AAF No jvd Chest clear bilat no rales RRR no MRG  Abd soft ntnd no mass or ascites +bs Ext bilat L > R lower leg edema 2+ Neuro is alert, Ox 3 , nf    Home meds:  - metformin 1gm bid/ glimepiride 1mg bid  - norvasc 10/ triamterene-hctz 75-50 qd  - neurontin 300 bid  - prilosec 40 qd/ synthroid 50ug qd  - aspirin 81/ lipitor 10  - prn's/ vitamins/ supplements    Date                           Creat               eGFR   2011- 07/2018             0.8- 1.1   08/2018                       1.5 >> 0.9        AKI mild   09/2018                       4.21 >> 1.05    AKI severe, 12 > 60   11/2018                     2.14                 26 ml/min   Today                        4.60                 10 ml/ min               July 2020 - rectal CA pt admitted for Robotic LAR , diverting ostomy and hernia repair   Aug 2020 - admit for dehydration and FTT, found to have postop SBO, NG placed and TPN given, she improved and GI tract functioned properly. DC'd home.    Sept 2020 - admit for dehydration/ AKI, esophagitis/ dysphagia, creat improved 4.2 > 1.0 at dc; esophagitis rx w/ IV then po PPI and empiric diflucan w/ GI f/u. Hx of CVA w/ R side weakness, chornic    UA 09/2018    20-50 wbc, 6-10 rbc, rare bact, neg protein      09/2018 - UNa <10, UCr 368   UA 03/30/19  6-10 wbc, 0-5 rbc, rare bact, neg protein      03/30/19 - UNa 12, UCr 102    CT abd noncon 3/20 > 1) Adrenals/Urinary Tract: bilateral nonobstructive nephrolithiasis is noted. No hydronephrosis or renal obstruction is  noted. Urinary bladder is unremarkable. 2) ileostomy RUQ  3) no acute abd /pelvis     Renal US - 9.0/ 9.1 cm kidneys w/o   hydro, normal echo     CXR - no active disease    Assessment/ Plan: 1. Renal failure - baseline creat from sept- nov 2020 is 1.0- 2.0.  Pt has had AKI episodes from dehydration/ high output ostomy in 2020 after her cancer surgery. UA and renal US unremarkable. Not sure cause. Pt is voiding but not being recorded. Creat down 5.0 on admit > 4.6 yest > 3.7 today. Swelling LE's "better" per patient. Getting IV hep for RLE DVT. Has bilat LE edema but hesitant to diurese for this given DVT on one side and improving creatinine. Cont to observe. Labs in am.  2. RLE DVT - multiple DVT's RLE, on IV hep per pmd 3. Rectal CA - sp chemo 2019-20 and LAR/ ileostomy in mid 2020 4. HTN -  BP's up slightly  5. DM2 - on po meds at home 6. Smoker 7. PAD - of LE's, has occluded L SFA stent    Rob Schertz 03/31/2019, 11:12 AM   Recent Labs  Lab 03/30/19 0346 03/31/19 0338  K 4.7 4.7  BUN 61* 52*  CREATININE 4.60* 3.72*  CALCIUM 9.3 9.0  HGB 7.6* 7.0*   Inpatient medications: . aspirin  81 mg Oral Daily  . atorvastatin  10 mg Oral QHS  . Chlorhexidine Gluconate Cloth  6 each Topical Daily  . gabapentin  300 mg Oral BID  . insulin aspart  0-5 Units Subcutaneous QHS  . insulin aspart  0-9 Units Subcutaneous TID WC  . iron polysaccharides  150 mg Oral BID  . levothyroxine  50 mcg Oral Q0600  . pantoprazole  40 mg Oral QAC breakfast  . sodium chloride flush  10-40 mL Intracatheter Q12H   . heparin 700 Units/hr (03/31/19 0637)   acetaminophen **OR** acetaminophen, hydrALAZINE, ondansetron **OR** ondansetron (ZOFRAN) IV, sodium chloride flush       

## 2019-03-31 NOTE — Progress Notes (Signed)
  Echocardiogram 2D Echocardiogram has been performed.  Nicole Sellers 03/31/2019, 11:52 AM

## 2019-03-31 NOTE — Progress Notes (Signed)
[facility] for Heparin Indication: DVT   Allergies  Allergen Reactions  . Kiwi Extract Itching and Other (See Comments)    Throat itching   . Lisinopril Other (See Comments)    Bruising   . Plavix [Clopidogrel] Other (See Comments)    "Made me feel badly- didn't agree with my system"    Patient Measurements: Height: 5' 6.5" (168.9 cm) Weight: 151 lb 7.3 oz (68.7 kg) IBW/kg (Calculated) : 60.45 Heparin Dosing Weight: 72 kg  Vital Signs: Temp: 98.3 F (36.8 C) (03/21 0917) Temp Source: Oral (03/21 0917) BP: 153/84 (03/21 0917) Pulse Rate: 70 (03/21 0917)  Labs: Recent Labs    03/29/19 1953 03/29/19 1953 03/30/19 0346 03/30/19 0905 03/30/19 2019 03/31/19 0338 03/31/19 1416  HGB 7.8*   < > 7.6*  --   --  7.0*  --   HCT 25.0*  --  23.6*  --   --  22.8*  --   PLT 338  --  324  --   --  313  --   HEPARINUNFRC  --   --  0.97*   < > 0.94* 0.74* 0.62  CREATININE 5.04*  --  4.60*  --   --  3.72*  --    < > = values in this interval not displayed.    Estimated Creatinine Clearance: 13.2 mL/min (A) (by C-G formula based on SCr of 3.72 mg/dL (H)).   Assessment: 69 y.o. F presents with leg swelling. 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 Pharmacy consulted to begin heparin infusion. Pt with AKI on CKD- SCr up to 5.04 (was 2.14 ~4 mos ago). No anticoagulation PTA. Hgb 7.8 on admission (has been ~8 past few months). 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  This afternoon, heparin level therapeutic after dose adjustments earlier today. Hgb low and trended down from previous at 7.0. Platelets WNL. Per RN, no bleeding noted and no issues with the infusion today (confirmed with RN that this infusion has been running continuously uninterrupted all day).  Goal of Therapy:  Heparin level 0.3-0.7 units/ml Monitor platelets by anticoagulation protocol: Yes   Plan:  Continue  heparin infusion to 700 units/hr Check 8h heparin level Monitor CBC, daily heparin level while on heparin Continue to monitor for signs/symptoms of bleeding F/u transition to oral anticoagulant    Brendolyn Patty, PharmD PGY2 Pharmacy Resident Phone [phone]

## 2019-03-31 NOTE — Plan of Care (Signed)
  Problem: Clinical Measurements: Goal: Diagnostic test results will improve Outcome: Progressing Goal: Respiratory complications will improve Outcome: Progressing   Problem: Nutrition: Goal: Adequate nutrition will be maintained Outcome: Progressing   Problem: Pain Managment: Goal: General experience of comfort will improve Outcome: Progressing   Problem: Skin Integrity: Goal: Risk for impaired skin integrity will decrease Outcome: Progressing

## 2019-04-01 LAB — CBC
HCT: 21.4 % — ABNORMAL LOW (ref 36.0–46.0)
Hemoglobin: 6.7 g/dL — CL (ref 12.0–15.0)
MCH: 30.9 pg (ref 26.0–34.0)
MCHC: 31.3 g/dL (ref 30.0–36.0)
MCV: 98.6 fL (ref 80.0–100.0)
Platelets: 305 10*3/uL (ref 150–400)
RBC: 2.17 MIL/uL — ABNORMAL LOW (ref 3.87–5.11)
RDW: 13.5 % (ref 11.5–15.5)
WBC: 2.8 10*3/uL — ABNORMAL LOW (ref 4.0–10.5)
nRBC: 0 % (ref 0.0–0.2)

## 2019-04-01 LAB — GLUCOSE, CAPILLARY
Glucose-Capillary: 90 mg/dL (ref 70–99)
Glucose-Capillary: 81 mg/dL (ref 70–99)
Glucose-Capillary: 169 mg/dL — ABNORMAL HIGH (ref 70–99)
Glucose-Capillary: 153 mg/dL — ABNORMAL HIGH (ref 70–99)
Glucose-Capillary: 90 mg/dL (ref 70–99)

## 2019-04-01 LAB — BASIC METABOLIC PANEL
Anion gap: 6 (ref 5–15)
BUN: 46 mg/dL — ABNORMAL HIGH (ref 8–23)
CO2: 20 mmol/L — ABNORMAL LOW (ref 22–32)
Calcium: 9.1 mg/dL (ref 8.9–10.3)
Chloride: 113 mmol/L — ABNORMAL HIGH (ref 98–111)
Creatinine, Ser: 3.53 mg/dL — ABNORMAL HIGH (ref 0.44–1.00)
GFR calc Af Amer: 14 mL/min — ABNORMAL LOW (ref >60)
GFR calc non Af Amer: 12 mL/min — ABNORMAL LOW (ref >60)
Glucose, Bld: 95 mg/dL (ref 70–99)
Potassium: 5.2 mmol/L — ABNORMAL HIGH (ref 3.5–5.1)
Sodium: 139 mmol/L (ref 135–145)

## 2019-04-01 LAB — HEPARIN LEVEL (UNFRACTIONATED): Heparin Unfractionated: 0.46 IU/mL (ref 0.30–0.70)

## 2019-04-01 MED ORDER — TRAMADOL HCL 50 MG PO TABS
50.0000 mg | ORAL_TABLET | Freq: Two times a day (BID) | ORAL | Status: DC | PRN
  Administered 2019-04-01 – 2019-04-03 (×3): 50 mg via ORAL
  Filled 2019-04-01 (×3): qty 1

## 2019-04-01 NOTE — Progress Notes (Signed)
PROGRESS NOTE    Nicole Sellers  MWN:[id number] DOB: 1950-08-28 DOA: 03/29/2019 PCP: Reynold Bowen, MD    Brief Narrative:  Nicole Sellers is a 69 year old African-American female with past medical history remarkable for stage III rectal cancer diagnosed 2019 status post chemotherapy/resection, PAD, T2DM, peripheral neuropathy, DVT Dx 11/2018 untreated who presented to the ED at the request of her PCP for abnormal labs with an elevated creatinine level.  Patient diagnosed with DVT in November 2020 has apparently not been treated, has been lost to follow-up in regards to general surgery and oncology.  Also patient follows with cardiology, Dr. Gwenlyn Found in regards to her peripheral artery disease.  Recent ultrasound arterial Dopplers notable for occluded left SFA stent, in which patient has outpatient appointment scheduled on 04/01/2019 with Dr. Gwenlyn Found.  In the ED, creatinine was noted to be 5.04; which is up from 2.1 in November 2020 and 1.0 in September 2020.  Hemoglobin 7.8.  Renal ultrasound suboptimal view of left kidney but otherwise unremarkable with no obstruction/masses appreciated.  Given her acute renal failure, hospital service was consulted for admission and further evaluation and work-up.   Assessment & Plan:   Principal Problem:   AKI (acute kidney injury) ([place]) Active Problems:   Essential hypertension   Non-insulin treated type 2 diabetes mellitus ([place])   PAD (peripheral artery disease) Rt ABI 0.7, Lt ABI 0.46   Rectal cancer (H[facility])   Acute kidney injury ([place])   DVT (deep venous thrombosis) (H[facility])   Acute renal failure Creatinine elevated to 5.01 on admission, up from 2.14 11/2018 and 1.05 09/2018.  Renal ultrasound with suboptimal views of left kidney but otherwise unremarkable without findings of obstruction or mass.  Is unremarkable. FeNa = 0.4% consistent with prerenal azotemia, although on diuretic therapy with Maxide which could alter result. FeUrea 36.5%, which is  consistent with intrinsic renal disease.  CT abdomen/pelvis negative for hydronephrosis or urinary obstruction. --Nephrology consult: Pending --Cr 5.04-->4.60-->3.72-->3.53 --Holding home Maxide, Metformin, glimepiride --Avoid nephrotoxins, renally dose all medications --Follow renal function daily  DVT Patient originally diagnosed with DVT right popliteal vein, right peroneal veins with age-indeterminate DVT right tibioperoneal trunk in November 2020.  Apparently has not been treated with anticoagulation.  Continues with bilateral lower extremity edema. Venous duplex ultrasound bilateral lower extremities positive for DVT right popliteal vein, right posterior tibial veins, right peroneal vein --Continue heparin drip until renal function improves prior to transitioning to Eliquis  Essential hypertension Patient on triamterene-HCTZ 75-50 mg p.o. daily at home. --Holding for acute kidney failure as above --Hydralazine 56m PO q8h prn for SBP >170  Peripheral arterial disease Follows with cardiology, Dr. BGwenlyn Foundoutpatient.  Recent arterial vascular ultrasound 03/25/2019 with occluded left SFA stent, bilateral occlusion peroneal artery.  Discussed case with Dr. BGwenlyn Found without critical limb ischemia or ulcerations, no indication for acute intervention at this time recommend continued outpatient follow-up.  Patient has follow-up scheduled in cardiology clinic with Dr. BGwenlyn Foundon 3/22. --Continue aspirin 81 mg p.o. daily  Type 2 diabetes mellitus Hemoglobin A1c 5.4, well controlled.  On Metformin 1000 mg twice daily, and glimepiride 1 mg p.o. twice daily at home. --will hold oral hypoglycemics in the setting of acute renal failure as above --Insulin sliding scale for further coverage  GERD: Continue PPI  Hypothyroidism: Continue levothyroxine 50 mcg p.o. daily  Iron deficiency anemia/anemia of chronic disease:  Hgb 7.6-->7.0-->6.7.  Anemia work-up with iron 220, TIBC 241, ferritin 417, B12 193,  consistent with anemia of chronic disease  likely secondary to renal failure. --Continues to decline blood transfusion, once again today --Pelvic care consultation for assistance with goals of care/medical decision making  HLD: Continue atorvastatin 10 mg p.o. daily  Peripheral neuropathy: Continue gabapentin 300 mg p.o. twice daily  DVT prophylaxis: Heparin drip Code Status: Full code Family Communication: Updated patient extensively at bedside Disposition Plan:      Patient is from: Home     Anticipated Disposition:  Home     Barriers to discharge or conditions that needs to be met prior to discharge: Improvement of renal function back to baseline; will also consult palliative care today for assistance with goals of care and medical decision making as she has refused intervention with blood transfusion  Consultants:   Nephrology - Dr. Jonnie Finner  Cardiology - Discussed with Dr. Gwenlyn Found on 3/20  Palliative care - pending consultation  Procedures:  Echocardiogram 03/31/2019 IMPRESSIONS    1. Left ventricular ejection fraction, by estimation, is 50 to 55%. The  left ventricle has low normal function. The left ventricle demonstrates  regional wall motion abnormalities (see scoring diagram/findings for  description). There is mild asymmetric  left ventricular hypertrophy of the septal segment. Left ventricular  diastolic parameters are consistent with Grade I diastolic dysfunction  (impaired relaxation).  2. Right ventricular systolic function is normal. The right ventricular  size is normal. There is mildly elevated pulmonary artery systolic  pressure.  3. The mitral valve is normal in structure. No evidence of mitral valve  regurgitation. No evidence of mitral stenosis.  4. The aortic valve is normal in structure. Aortic valve regurgitation is  not visualized. Mild aortic valve sclerosis is present, with no evidence  of aortic valve stenosis.  5. The inferior vena cava is  dilated in size with >50% respiratory  variability, suggesting right atrial pressure of 8 mmHg.    Antimicrobials:   None   Subjective: Patient seen and examined bedside, resting comfortably.  No specific complaint this morning.  Hemoglobin down to 6.7, discussed with patient need for blood transfusion, she declines once again today.  Renal function slightly improved, creatinine 3.53 today.  States swelling improved bilateral lower extremities.  No other complaints or concerns at this time. Denies headache, no visual changes, no chest pain, palpitations, no shortness of breath, no abdominal pain, no weakness, no fatigue.  No acute events overnight per nursing staff.  Objective: Vitals:   03/31/19 1531 03/31/19 1944 04/01/19 0320 04/01/19 0500  BP: (!) 154/67 (!) 167/76 130/62   Pulse: 76 88 77   Resp: _0 Temp: (!) 97.5 F (36.4 C) (!) 97.4 F (36.3 C) 98 F (36.7 C)   TempSrc: Oral Oral Oral   SpO2:  100% 100%   Weight:    70.1 kg  Height:        Intake/Output Summary (Last 24 hours) at 04/01/2019 0732 Last data filed at 04/01/2019 0600 Gross per 24 hour  Intake 1084 ml  Output --  Net 1084 ml   Filed Weights   03/29/19 1937 03/31/19 0500 04/01/19 0500  Weight: 72.6 kg 68.7 kg 70.1 kg    Examination:  General exam: Appears calm and comfortable  Respiratory system: Clear to auscultation. Respiratory effort normal.  Oxygenating well on room air Cardiovascular system: S1 & S2 heard, RRR. No JVD, murmurs, rubs, gallops or clicks.  3+ pedal edema bilaterally to level of knee Gastrointestinal system: Abdomen is nondistended, soft and nontender. No organomegaly or masses felt. Normal bowel sounds  heard. Central nervous system: Alert and oriented. No focal neurological deficits. Extremities: Symmetric 5 x 5 power. Skin: No rashes, lesions or ulcers Psychiatry: Judgement and insight appear poor. Mood & affect appropriate.     Data Reviewed: I have personally reviewed  following labs and imaging studies  CBC: Recent Labs  Lab 03/29/19 1953 03/30/19 0346 03/31/19 0338 04/01/19 0455  WBC 3.9* 3.8* 3.1* 2.8*  HGB 7.8* 7.6* 7.0* 6.7*  HCT 25.0* 23.6* 22.8* 21.4*  MCV 98.0 98.3 98.7 98.6  PLT 338 324 313 403   Basic Metabolic Panel: Recent Labs  Lab 03/29/19 1953 03/30/19 0346 03/31/19 0338 04/01/19 0455  NA 137 135 136 139  K 5.1 4.7 4.7 5.2*  CL 105 105 110 113*  CO2 20* 20* 19* 20*  GLUCOSE 146* 192* 102* 95  BUN 65* 61* 52* 46*  CREATININE 5.04* 4.60* 3.72* 3.53*  CALCIUM 9.6 9.3 9.0 9.1   GFR: Estimated Creatinine Clearance: 14 mL/min (A) (by C-G formula based on SCr of 3.53 mg/dL (H)). Liver Function Tests: No results for input(s): AST, ALT, ALKPHOS, BILITOT, PROT, ALBUMIN in the last 168 hours. No results for input(s): LIPASE, AMYLASE in the last 168 hours. No results for input(s): AMMONIA in the last 168 hours. Coagulation Profile: No results for input(s): INR, PROTIME in the last 168 hours. Cardiac Enzymes: No results for input(s): CKTOTAL, CKMB, CKMBINDEX, TROPONINI in the last 168 hours. BNP (last 3 results) No results for input(s): PROBNP in the last 8760 hours. HbA1C: Recent Labs    03/29/19 1953  HGBA1C 5.4   CBG: Recent Labs  Lab 03/31/19 0627 03/31/19 1202 03/31/19 1658 03/31/19 2014 04/01/19 0642  GLUCAP 95 141* 157* 91 90   Lipid Profile: No results for input(s): CHOL, HDL, LDLCALC, TRIG, CHOLHDL, LDLDIRECT in the last 72 hours. Thyroid Function Tests: No results for input(s): TSH, T4TOTAL, FREET4, T3FREE, THYROIDAB in the last 72 hours. Anemia Panel: Recent Labs    03/31/19 0836  VITAMINB12 193  FERRITIN 417*  TIBC 241*  IRON 220*   Sepsis Labs: No results for input(s): PROCALCITON, LATICACIDVEN in the last 168 hours.  Recent Results (from the past 240 hour(s))  SARS CORONAVIRUS 2 (TAT 6-24 HRS) Nasopharyngeal Nasopharyngeal Swab     Status: None   Collection Time: 03/29/19 10:38 PM    Specimen: Nasopharyngeal Swab  Result Value Ref Range Status   SARS Coronavirus 2 NEGATIVE NEGATIVE Final    Comment: (NOTE) SARS-CoV-2 target nucleic acids are NOT DETECTED. The SARS-CoV-2 RNA is generally detectable in upper and lower respiratory specimens during the acute phase of infection. Negative results do not preclude SARS-CoV-2 infection, do not rule out co-infections with other pathogens, and should not be used as the sole basis for treatment or other patient management decisions. Negative results must be combined with clinical observations, patient history, and epidemiological information. The expected result is Negative. Fact Sheet for Patients: SugarRoll.be Fact Sheet for Healthcare Providers: https://www.woods-mathews.com/ This test is not yet approved or cleared by the [place] FDA and  has been authorized for detection and/or diagnosis of SARS-CoV-2 by FDA under an Emergency Use Authorization (EUA). This EUA will remain  in effect (meaning this test can be used) for the duration of the COVID-19 declaration under Section 56 4(b)(1) of the Act, 21 U.S.C. section 360bbb-3(b)(1), unless the authorization is terminated or revoked sooner. Performed at [facility], [place] [address] [place], [place] [address]          Radiology  Studies: CT ABDOMEN PELVIS WO CONTRAST  Result Date: 03/30/2019 CLINICAL DATA:  Acute kidney injury. History of colorectal cancer. EXAM: CT ABDOMEN AND PELVIS WITHOUT CONTRAST TECHNIQUE: Multidetector CT imaging of the abdomen and pelvis was performed following the standard protocol without IV contrast due to acute kidney injury. COMPARISON:  January 17, 2018. FINDINGS: Lower chest: Stable small subcentimeter nodules are noted. No acute abnormality seen in visualized lung bases. Hepatobiliary: No focal liver abnormality is seen. No gallstones, gallbladder wall thickening, or biliary dilatation.  Pancreas: Unremarkable. No pancreatic ductal dilatation or surrounding inflammatory changes. Spleen: Normal in size without focal abnormality. Adrenals/Urinary Tract: Adrenal glands appear normal. Bilateral nonobstructive nephrolithiasis is noted. No hydronephrosis or renal obstruction is noted. Urinary bladder is unremarkable. Stomach/Bowel: Stomach is unremarkable. 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 There is no evidence of bowel obstruction or inflammation. The appendix appears normal. Sigmoid diverticulosis is noted without inflammation. Vascular/Lymphatic: Aortic atherosclerosis. No enlarged abdominal or pelvic lymph nodes. Reproductive: Status post hysterectomy. No adnexal masses. Other: No abdominal wall hernia or abnormality. No abdominopelvic ascites. Musculoskeletal: No acute or significant osseous findings. IMPRESSION: 1. Bilateral nonobstructive nephrolithiasis. No hydronephrosis or renal obstruction is noted. 2. 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 3. Sigmoid diverticulosis is noted without inflammation. 4. No acute abnormality seen in the abdomen or pelvis. Aortic Atherosclerosis (ICD10-I70.0). Electronically Signed   By: Marijo Conception M.D.   On: 03/30/2019 13:35   DG CHEST PORT 1 VIEW  Result Date: 03/30/2019 CLINICAL DATA:  Acute kidney injury EXAM: PORTABLE CHEST 1 VIEW COMPARISON:  11/22/2017 FINDINGS: Right Port-A-Cath remains in place, unchanged. Heart is upper limits normal in size. No confluent opacities or effusions. No acute bony abnormality. IMPRESSION: No active disease. Electronically Signed   By: Rolm Baptise M.D.   On: 03/30/2019 22:49   ECHOCARDIOGRAM COMPLETE  Result Date: 03/31/2019    ECHOCARDIOGRAM REPORT   Patient Name:   CONSANDRA LASKE Date of Exam: 03/31/2019 Medical Rec #:  [id number]        Height:       66.5 in Accession #:    [id number]       Weight:       151.5 lb Date of Birth:  05-02-1950        BSA:          1.787 m Patient Age:    69 years          BP:           153/84 mmHg Patient Gender: F                HR:           70 bpm. Exam Location:  Inpatient Procedure: 2D Echo, Cardiac Doppler and Color Doppler Indications:    Dyspnea 786.09/R06.00  History:        Patient has no prior history of Echocardiogram examinations.                 Risk Factors:Hypertension, Diabetes and Former Smoker. PAD. DVT.  Sonographer:    Clayton Lefort RDCS (AE) Referring Phys: [id number] Patterson Hollen[place]augh J [place] IMPRESSIONS  1. Left ventricular ejection fraction, by estimation, is 50 to 55%. The left ventricle has low normal function. The left ventricle demonstrates regional wall motion abnormalities (see scoring diagram/findings for description). There is mild asymmetric left ventricular hypertrophy of the septal segment. Left ventricular diastolic parameters are consistent with Grade I diastolic dysfunction (impaired relaxation).  2. Right ventricular  systolic function is normal. The right ventricular size is normal. There is mildly elevated pulmonary artery systolic pressure.  3. The mitral valve is normal in structure. No evidence of mitral valve regurgitation. No evidence of mitral stenosis.  4. The aortic valve is normal in structure. Aortic valve regurgitation is not visualized. Mild aortic valve sclerosis is present, with no evidence of aortic valve stenosis.  5. The inferior vena cava is dilated in size with >50% respiratory variability, suggesting right atrial pressure of 8 mmHg. FINDINGS  Left Ventricle: Left ventricular ejection fraction, by estimation, is 50 to 55%. The left ventricle has low normal function. The left ventricle demonstrates regional wall motion abnormalities. The left ventricular internal cavity size was normal in size. There is mild asymmetric left ventricular hypertrophy of the septal segment. Left ventricular diastolic parameters are consistent with Grade I diastolic dysfunction (impaired relaxation).  LV Wall Scoring: The mid anteroseptal segment and mid  inferoseptal segment are akinetic. Right Ventricle: The right ventricular size is normal. No increase in right ventricular wall thickness. Right ventricular systolic function is normal. There is mildly elevated pulmonary artery systolic pressure. The tricuspid regurgitant velocity is 2.41  m/s, and with an assumed right atrial pressure of 8 mmHg, the estimated right ventricular systolic pressure is 50.5 mmHg. Left Atrium: Left atrial size was normal in size. Right Atrium: Right atrial size was normal in size. Pericardium: There is no evidence of pericardial effusion. Mitral Valve: The mitral valve is normal in structure. Normal mobility of the mitral valve leaflets. Mild mitral annular calcification. No evidence of mitral valve regurgitation. No evidence of mitral valve stenosis. Tricuspid Valve: The tricuspid valve is normal in structure. Tricuspid valve regurgitation is trivial. No evidence of tricuspid stenosis. Aortic Valve: The aortic valve is normal in structure.. There is mild thickening and mild calcification of the aortic valve. Aortic valve regurgitation is not visualized. Mild aortic valve sclerosis is present, with no evidence of aortic valve stenosis. There is mild thickening of the aortic valve. There is mild calcification of the aortic valve. Pulmonic Valve: The pulmonic valve was normal in structure. Pulmonic valve regurgitation is not visualized. No evidence of pulmonic stenosis. Aorta: The aortic root is normal in size and structure. Venous: The inferior vena cava is dilated in size with greater than 50% respiratory variability, suggesting right atrial pressure of 8 mmHg. IAS/Shunts: No atrial level shunt detected by color flow Doppler.  LEFT VENTRICLE PLAX 2D LVIDd:         3.86 cm  Diastology LVIDs:         2.76 cm  LV e' lateral:   7.51 cm/s LV PW:         1.14 cm  LV E/e' lateral: 13.2 LV IVS:        1.36 cm  LV e' medial:    5.33 cm/s LVOT diam:     1.90 cm  LV E/e' medial:  18.6 LV SV:          65 LV SV Index:   36 LVOT Area:     2.84 cm  RIGHT VENTRICLE             IVC RV Basal diam:  2.48 cm     IVC diam: 2.34 cm RV S prime:     13.30 cm/s TAPSE (M-mode): 2.2 cm LEFT ATRIUM             Index       RIGHT ATRIUM  Index LA diam:        2.50 cm 1.40 cm/m  RA Area:     12.20 cm LA Vol (A2C):   57.6 ml 32.23 ml/m RA Volume:   26.10 ml  14.61 ml/m LA Vol (A4C):   48.2 ml 26.97 ml/m LA Biplane Vol: 52.6 ml 29.44 ml/m  AORTIC VALVE LVOT Vmax:   90.10 cm/s LVOT Vmean:  57.400 cm/s LVOT VTI:    0.230 m  AORTA Ao Root diam: 3.00 cm Ao Asc diam:  3.10 cm MITRAL VALVE                TRICUSPID VALVE MV Area (PHT): 3.60 cm     TR Peak grad:   23.2 mmHg MV Decel Time: 211 msec     TR Vmax:        241.00 cm/s MV E velocity: 99.10 cm/s MV A velocity: 152.00 cm/s  SHUNTS MV E/A ratio:  0.65         Systemic VTI:  0.23 m                             Systemic Diam: 1.90 cm Candee Furbish MD Electronically signed by Candee Furbish MD Signature Date/Time: 03/31/2019/12:23:02 PM    Final    VAS [place] LOWER EXTREMITY VENOUS (DVT)  Result Date: 03/31/2019  Lower Venous DVTStudy Indications: Edema, and History of untreated DVT in right Popliteal, posterior tibial, and peroneal veins, 11/15/18.  Risk Factors: Cancer Stage III rectal cancer 2019 Acute kidney injury, CKD. Comparison Study: Prior study from 11/15/18 is available for comparison Performing Technologist: Sharion Dove RVS  Examination Guidelines: A complete evaluation includes B-mode imaging, spectral Doppler, color Doppler, and power Doppler as needed of all accessible portions of each vessel. Bilateral testing is considered an integral part of a complete examination. Limited examinations for reoccurring indications may be performed as noted. The reflux portion of the exam is performed with the patient in reverse Trendelenburg.  +---------+---------------+---------+-----------+----------+-----------------+ RIGHT     CompressibilityPhasicitySpontaneityPropertiesThrombus Aging    +---------+---------------+---------+-----------+----------+-----------------+ CFV      Full           Yes      Yes                                    +---------+---------------+---------+-----------+----------+-----------------+ SFJ      Full                                                           +---------+---------------+---------+-----------+----------+-----------------+ FV Prox  Full                                                           +---------+---------------+---------+-----------+----------+-----------------+ FV Mid   Full                                                           +---------+---------------+---------+-----------+----------+-----------------+  FV DistalFull                                                           +---------+---------------+---------+-----------+----------+-----------------+ PFV      Full                                                           +---------+---------------+---------+-----------+----------+-----------------+ POP      Partial        Yes      Yes                  Age Indeterminate +---------+---------------+---------+-----------+----------+-----------------+ PTV      Partial                                      Chronic           +---------+---------------+---------+-----------+----------+-----------------+ PERO     Partial                                      Chronic           +---------+---------------+---------+-----------+----------+-----------------+   +---------+---------------+---------+-----------+----------+--------------+ LEFT     CompressibilityPhasicitySpontaneityPropertiesThrombus Aging +---------+---------------+---------+-----------+----------+--------------+ CFV      Full           Yes      Yes                                  +---------+---------------+---------+-----------+----------+--------------+ SFJ      Full                                                        +---------+---------------+---------+-----------+----------+--------------+ FV Prox  Full                                                        +---------+---------------+---------+-----------+----------+--------------+ FV Mid   Full                                                        +---------+---------------+---------+-----------+----------+--------------+ FV DistalFull                                                        +---------+---------------+---------+-----------+----------+--------------+ PFV      Full                                                        +---------+---------------+---------+-----------+----------+--------------+  POP      Full           Yes      Yes                                 +---------+---------------+---------+-----------+----------+--------------+ PTV      Full                                                        +---------+---------------+---------+-----------+----------+--------------+ PERO     Full                                                        +---------+---------------+---------+-----------+----------+--------------+     Summary: RIGHT: - Findings consistent with age indeterminate deep vein thrombosis involving the right popliteal vein. 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 Significant interstitial fluid - Ultrasound characteristics of enlarged lymph nodes are noted in the groin.  LEFT: - There is no evidence of deep vein thrombosis in the lower extremity.  Significant interstitial fluid.  *See table(s) above for measurements and observations. Electronically signed by Ruta Hinds MD on 03/31/2019 at 12:40:59 PM.    Final         Scheduled Meds: . aspirin  81 mg Oral  Daily  . atorvastatin  10 mg Oral QHS  . Chlorhexidine Gluconate Cloth  6 each Topical Daily  . gabapentin  300 mg Oral BID  . insulin aspart  0-5 Units Subcutaneous QHS  . insulin aspart  0-9 Units Subcutaneous TID WC  . iron polysaccharides  150 mg Oral BID  . levothyroxine  50 mcg Oral Q0600  . pantoprazole  40 mg Oral QAC breakfast  . sodium chloride flush  10-40 mL Intracatheter Q12H   Continuous Infusions: . heparin       LOS: 3 days    Time spent: 35 minutes spent on chart review, discussion with nursing staff, consultants, updating family and interview/physical exam; more than 50% of that time was spent in counseling and/or coordination of care.    Dania Marsan J [place] DO [facility] Available via Epic secure chat 7am-7pm After these hours, please refer to coverage provider listed on [email or web address] 04/01/2019, 7:32 AM

## 2019-04-01 NOTE — Progress Notes (Signed)
[facility] Progress Note  Subjective: creat down 3.7 > 3.5 today , No recorded UOP yest but says going  Vitals:   03/31/19 1944 04/01/19 0320 04/01/19 0500 04/01/19 0741  BP: (!) 167/76 130/62  (!) 128/56  Pulse: 88 77  72  Resp: '17 18  16  '$ Temp: (!) 97.4 F (36.3 C) 98 F (36.7 C)  98.3 F (36.8 C)  TempSrc: Oral Oral  Oral  SpO2: 100% 100%  100%  Weight:   70.1 kg   Height:        Exam: Gen no distress, calm, elderly AAF  No jvd Chest clear bilat no rales RRR no MRG  Abd soft ntnd no mass or ascites +bs Ext bilat L > R lower leg edema 2+, some venous stasis appearing ulcers on L tibia, no erythema or drainage Neuro is alert, Ox 3 , nf    Home meds:  - metformin 1gm bid/ glimepiride '1mg'$  bid  - norvasc 10/ triamterene-hctz 75-50 qd  - neurontin 300 bid  - prilosec 40 qd/ synthroid 50ug qd  - aspirin 81/ lipitor 10  - prn's/ vitamins/ supplements    Date                           Creat               eGFR   2011- 07/2018             0.8- 1.1   08/2018                       1.5 >> 0.9        AKI mild   09/2018                       4.21 >> 1.05    AKI severe, 12 > 60   11/2018                     2.14                 26 ml/min   Today                        4.60                 10 ml/ min               July 2020 - rectal CA pt admitted for Robotic LAR , diverting ostomy and hernia repair   Aug 2020 - admit for dehydration and FTT, found to have postop SBO, NG placed and TPN given, she improved and GI tract functioned properly. [place] home.    Sept 2020 - admit for dehydration/ AKI, esophagitis/ dysphagia, creat improved 4.2 > 1.0 at dc; esophagitis rx w/ IV then po PPI and empiric diflucan w/ GI f/u. Hx of CVA w/ R side weakness, chornic    UA 09/2018    20-50 wbc, 6-10 rbc, rare bact, neg protein      09/2018 - UNa <10, UCr 368   UA 03/30/19  6-10 wbc, 0-5 rbc, rare bact, neg protein      03/30/19 - UNa 12, UCr 102    CT abd noncon 3/20 > 1)  Adrenals/Urinary Tract: bilateral nonobstructive nephrolithiasis is noted. No hydronephrosis or renal obstruction is noted. Urinary bladder is unremarkable. 2)  ileostomy RUQ  3) no acute abd /pelvis     Renal US - 9.0/ 9.1 cm kidneys w/o hydro, normal echo     CXR - no active disease    Assessment/ Plan: 1. Renal failure - baseline creat from sept- nov 2020 is 1.0- 2.0.  Pt has had AKI episodes from dehydration/ high output ostomy in 2020 after her cancer surgery. UA and renal US unremarkable but noted low urine sodium and FeNa 0.4% ? Prerenal and was treated with NS bolus 1L on presentation.  Pt is voiding but not being recorded. Creat down 5.0 on admit > 4.6 > 3.7 > 3.5 today. Swelling LE's "better" per patient. Getting IV hep for RLE DVT. Has bilat LE edema but hesitant to diurese for this given DVT on one side and improving creatinine. Cont to observe. Labs in am.   May be prudent to hold diuretic at discharge 2. RLE DVT - multiple DVT's RLE, on IV hep per pmd 3. Rectal CA - sp chemo 2019-20 and LAR/ ileostomy in mid 2020 4. HTN -  BP's 128/56 today 5. DM2 - on po meds at home 6. Smoker 7. PAD - of LE's, has occluded L SFA stent   Jannifer Hick MD [facility] Pager [phone]   Recent Labs  Lab 03/31/19 [phone] 04/01/19 0455  K 4.7 5.2*  BUN 52* 46*  CREATININE 3.72* 3.53*  CALCIUM 9.0 9.1  HGB 7.0* 6.7*   Inpatient medications: . aspirin  81 mg Oral Daily  . atorvastatin  10 mg Oral QHS  . Chlorhexidine Gluconate Cloth  6 each Topical Daily  . gabapentin  300 mg Oral BID  . insulin aspart  0-5 Units Subcutaneous QHS  . insulin aspart  0-9 Units Subcutaneous TID WC  . iron polysaccharides  150 mg Oral BID  . levothyroxine  50 mcg Oral Q0600  . pantoprazole  40 mg Oral QAC breakfast  . sodium chloride flush  10-40 mL Intracatheter Q12H   . heparin     acetaminophen **OR** acetaminophen, hydrALAZINE, ondansetron **OR** ondansetron (ZOFRAN) IV, sodium  chloride flush

## 2019-04-01 NOTE — Progress Notes (Signed)
ANTICOAGULATION CONSULT NOTE - Follow Up Consult  Pharmacy Consult for heparin Indication: DVT  Labs: Recent Labs    03/30/19 0346 03/30/19 0905 03/31/19 0338 03/31/19 0338 03/31/19 1416 03/31/19 2219 04/01/19 0455  HGB 7.6*  --  7.0*  --   --   --  6.7*  HCT 23.6*  --  22.8*  --   --   --  21.4*  PLT 324  --  313  --   --   --  305  HEPARINUNFRC 0.97*   < > 0.74*   < > 0.62 0.54 0.46  CREATININE 4.60*  --  3.72*  --   --   --  3.53*   < > = values in this interval not displayed.    Assessment/Plan:  69yo female remains therapeutic on heparin. Will continue gtt at current rate and monitor level daily.   Scr trending down, HgB 6.7 (planning transfusion)  Thank you Anette Guarneri, PharmD 04/01/2019,8:30 AM

## 2019-04-01 NOTE — Consult Note (Signed)
[place] Nurse ostomy consult note Stoma type/location: RLQ, ileostomy Stomal assessment/size: 1" round, budded, pink Peristomal assessment: NA Treatment options for stomal/peristomal skin: NA Output liquid brown/green Ostomy pouching: 1pc convex.  Education provided: patient independent in ostomy care  Enrolled patient in [place] program: Yes, previously   Re consult if needed, will not follow at this time. Thanks  Samarrah Tranchina [employer], RN,CWOCN, CNS, CWON-AP [phone]).

## 2019-04-02 ENCOUNTER — Ambulatory Visit: Payer: Medicare Other | Admitting: Cardiovascular Disease

## 2019-04-02 DIAGNOSIS — I824Y1 Acute embolism and thrombosis of unspecified deep veins of right proximal lower extremity: Secondary | ICD-10-CM

## 2019-04-02 DIAGNOSIS — I739 Peripheral vascular disease, unspecified: Secondary | ICD-10-CM

## 2019-04-02 DIAGNOSIS — E119 Type 2 diabetes mellitus without complications: Secondary | ICD-10-CM

## 2019-04-02 DIAGNOSIS — Z66 Do not resuscitate: Secondary | ICD-10-CM

## 2019-04-02 DIAGNOSIS — Z7189 Other specified counseling: Secondary | ICD-10-CM

## 2019-04-02 DIAGNOSIS — N179 Acute kidney failure, unspecified: Secondary | ICD-10-CM

## 2019-04-02 DIAGNOSIS — C20 Malignant neoplasm of rectum: Secondary | ICD-10-CM

## 2019-04-02 DIAGNOSIS — Z515 Encounter for palliative care: Secondary | ICD-10-CM

## 2019-04-02 DIAGNOSIS — I10 Essential (primary) hypertension: Secondary | ICD-10-CM

## 2019-04-02 LAB — BASIC METABOLIC PANEL
Anion gap: 4 — ABNORMAL LOW (ref 5–15)
BUN: 46 mg/dL — ABNORMAL HIGH (ref 8–23)
CO2: 19 mmol/L — ABNORMAL LOW (ref 22–32)
Calcium: 8.9 mg/dL (ref 8.9–10.3)
Chloride: 115 mmol/L — ABNORMAL HIGH (ref 98–111)
Creatinine, Ser: 3.51 mg/dL — ABNORMAL HIGH (ref 0.44–1.00)
GFR calc Af Amer: 14 mL/min — ABNORMAL LOW (ref >60)
GFR calc non Af Amer: 12 mL/min — ABNORMAL LOW (ref >60)
Glucose, Bld: 98 mg/dL (ref 70–99)
Potassium: 6 mmol/L — ABNORMAL HIGH (ref 3.5–5.1)
Sodium: 138 mmol/L (ref 135–145)

## 2019-04-02 LAB — RENAL FUNCTION PANEL
Albumin: 2.8 g/dL — ABNORMAL LOW (ref 3.5–5.0)
Anion gap: 6 (ref 5–15)
BUN: 41 mg/dL — ABNORMAL HIGH (ref 8–23)
CO2: 19 mmol/L — ABNORMAL LOW (ref 22–32)
Calcium: 9 mg/dL (ref 8.9–10.3)
Chloride: 110 mmol/L (ref 98–111)
Creatinine, Ser: 3.29 mg/dL — ABNORMAL HIGH (ref 0.44–1.00)
GFR calc Af Amer: 16 mL/min — ABNORMAL LOW (ref >60)
GFR calc non Af Amer: 13 mL/min — ABNORMAL LOW (ref >60)
Glucose, Bld: 134 mg/dL — ABNORMAL HIGH (ref 70–99)
Phosphorus: 3.5 mg/dL (ref 2.5–4.6)
Potassium: 5.5 mmol/L — ABNORMAL HIGH (ref 3.5–5.1)
Sodium: 135 mmol/L (ref 135–145)

## 2019-04-02 LAB — CBC
HCT: 20.8 % — ABNORMAL LOW (ref 36.0–46.0)
Hemoglobin: 6.3 g/dL — CL (ref 12.0–15.0)
MCH: 30.4 pg (ref 26.0–34.0)
MCHC: 30.3 g/dL (ref 30.0–36.0)
MCV: 100.5 fL — ABNORMAL HIGH (ref 80.0–100.0)
Platelets: 275 10*3/uL (ref 150–400)
RBC: 2.07 MIL/uL — ABNORMAL LOW (ref 3.87–5.11)
RDW: 13.8 % (ref 11.5–15.5)
WBC: 2.9 10*3/uL — ABNORMAL LOW (ref 4.0–10.5)
nRBC: 0 % (ref 0.0–0.2)

## 2019-04-02 LAB — FOLATE RBC
Folate, Hemolysate: 273 ng/mL
Folate, RBC: 1208 ng/mL (ref >498)
Hematocrit: 22.6 % — ABNORMAL LOW (ref 34.0–46.6)

## 2019-04-02 LAB — HEPARIN LEVEL (UNFRACTIONATED): Heparin Unfractionated: 0.57 IU/mL (ref 0.30–0.70)

## 2019-04-02 LAB — GLUCOSE, CAPILLARY
Glucose-Capillary: 100 mg/dL — ABNORMAL HIGH (ref 70–99)
Glucose-Capillary: 131 mg/dL — ABNORMAL HIGH (ref 70–99)
Glucose-Capillary: 143 mg/dL — ABNORMAL HIGH (ref 70–99)
Glucose-Capillary: 143 mg/dL — ABNORMAL HIGH (ref 70–99)
Glucose-Capillary: 162 mg/dL — ABNORMAL HIGH (ref 70–99)
Glucose-Capillary: 75 mg/dL (ref 70–99)

## 2019-04-02 MED ORDER — SODIUM ZIRCONIUM CYCLOSILICATE 10 G PO PACK
10.0000 g | PACK | Freq: Three times a day (TID) | ORAL | Status: AC
  Administered 2019-04-02 (×2): 10 g via ORAL
  Filled 2019-04-02 (×2): qty 1

## 2019-04-02 MED ORDER — CALCIUM GLUCONATE-NACL 1-0.675 GM/50ML-% IV SOLN
1.0000 g | Freq: Once | INTRAVENOUS | Status: AC
  Administered 2019-04-02: 1000 mg via INTRAVENOUS
  Filled 2019-04-02: qty 50

## 2019-04-02 MED ORDER — DEXTROSE 50 % IV SOLN
1.0000 | Freq: Once | INTRAVENOUS | Status: AC
  Administered 2019-04-02: 50 mL via INTRAVENOUS
  Filled 2019-04-02: qty 50

## 2019-04-02 MED ORDER — INSULIN ASPART 100 UNIT/ML IV SOLN
10.0000 [IU] | Freq: Once | INTRAVENOUS | Status: AC
  Administered 2019-04-02: 10 [IU] via INTRAVENOUS

## 2019-04-02 NOTE — Plan of Care (Signed)

## 2019-04-02 NOTE — Progress Notes (Signed)
PIV consult: Discussed with RN: Pt has implanted port. Use brand new tubing, scrub distal hub for 5 seconds, allow site to dry then connect for additional IV infusions.  Incompatible meds may not be run via port. It is single lumen.

## 2019-04-02 NOTE — Progress Notes (Addendum)
Pt refused blood product again. I did not see a blood refusal form completed in the drawer outside the room, so I started the form and the only thing remaining is the Physicians signature. Will pass information on to day time nurse to obtain signature.   Pt potassium 6.0, notified [place]. Will continue to monitor.

## 2019-04-02 NOTE — Consult Note (Signed)
Consultation Note Date: 04/02/2019   Patient Name: Nicole Sellers  DOB: 1950-10-30  MRN: [id number]  Age / Sex: 69 y.o., female   PCP: Reynold Bowen, MD Referring Physician: [place] Eric J, DO   REASON FOR CONSULTATION:Establishing goals of care  Palliative Care consult requested for goals of care discussion in this 69 y.o. female with multiple medical problems including rectal cancer stage 3 (2019) s/p chemotherapy and resection, colostomy, hypertension, hyperlipidemia, hypothyroidism, diabetes type II, PAD, peripheral neuropathy, and GERD. Patient presented to the ED after a recent visit with her PCP and lab work showed an elevated creatinine. During her ED work-up Cr 5.0 (2.1 in Nov. 2020), BUN 65. Renal ultrasound negative. Since admission patient is being followed by Nephrology. Hemoglobin 6.3 however patient is refusing transfusion.   Clinical Assessment and Goals of Care: I have reviewed medical records including lab results, imaging, Epic notes, and MAR, received report from the bedside RN, and assessed the patient. I met at the bedside with patient to discuss diagnosis prognosis, GOC, EOL wishes, disposition and options.  Nicole Sellers is awake in bed and watching tv. She complains of pain in her left leg. The RN is preparing to administer PRN medication for pain. She Denies shortness of breath. A&O x3. No family at the bedside. I offered to include family or anyone that she felt important to be included in goals of care and decision making. She declines.   I introduced Palliative Medicine as specialized medical care for people living with serious illness. It focuses on providing relief from the symptoms and stress of a serious illness. The goal is to improve quality of life for both the patient and the family. She verbalizes understanding and appreciation of our involvement.   We discussed a brief life review of the patient, along with her functional and nutritional status. She  reports she has one son. He comes over to check on her and take her to some appointments however she states their relationship is "up and down" at times. She is divorced but states she still has a good relationship with her ex-husband and he will call and check in on her. She states she does not have the best relationship with her sisters. She relies on her neighbors for some support. She is tearful in sharing that her best friend of more than 40 years passed away last month. She reports her friend's son had passed away weeks prior to her death due to complications of COVID and her friend went into a depression, stopped taking her heart medications, and died from malnutrition and a heart attack. Support given.   Prior to admission she reports being able to perform all ADLs independently. She uses a cane/walking stick for stability due to her peripheral neuropathy. She reports she uses medical transportation for appointments and a Taxi or [employer] to run errands and grocery shop.   We discussed Her current illness and what it means in the larger context of Her on-going co-morbidities. With specific discussions regarding her anemia and renal failure. Natural disease trajectory and expectations at EOL were discussed.  Nicole Sellers understanding of her current illness. I discussed with her extensively with her about her anemia and renal failure. She shares previous situations with friends regarding poor quality of life with HD and comparison of her mistrust in receiving blood transfusions as it relates to African Americans. She acknowledges medical treatment and protocols used once blood donation occurs and the extensive test etc. However,  she continues to express mistrust in the system. I educated her on the possibility of further decline, fatigue, further damage to her kidneys, heart complications such as MI, and even sudden death with continued anemia. She verbalized understanding expressing her decision  that she is not interested in a transfusion. Support given.   I discussed her CKD and worsening renal failure. I discussed if her kidneys were to worsen her wishes in regards to the need for HD. Nicole Sellers states she would not be interested or even consider the thought of HD. She reports "I guess that is my que amongst everything else that it is almost time for me to go. I am getting older and you can't change that but I can decide what I am not going to do or put myself through!" Therapeutic listening and support given.   I attempted to elicit values and goals of care important to the patient.    The difference between aggressive medical intervention and comfort care was considered in light of the patient's goals of care. Nicole Sellers expresses her goals are to continue with full aggressive treatment with limitations of no blood transfusions or HD if needed. She reports she will make any further decisions regarding care once they arise. She is hopeful for improvement and recovery but also prepared for the worst.   I asked about her support system and again including her son. She states she is worried that her son would say do everything but yet does not really do much for her currently. Stating "he would want to keep me here for his own selfish reasons!"   Advanced directives, concepts specific to code status, artifical feeding and hydration, and rehospitalization were considered and discussed. Patient does not have a documented advanced directive but is inquiring about the contents of the document. I discussed at length with her about advanced directives. Also discussing her current full code status with consideration to her current illness and co-morbidities.   Nicole Sellers reports she would never want to undergo CPR or be placed on life-support. She reports she would like to place her wishes in writing and does not want her son to be able to alter her wishes in the event he is needed to finalize any  healthcare decisions for her. I explained she could indicate on the document that her wishes are not to be altered in any way. She verbalized understanding and appreciation. She is requesting to complete her AD while hospitalized. Packet that was reviewed left at the bedside and she is aware a Chaplain consult has been placed to assist with completion.   I again discussed her expressed wishes for no heroic measures and what a DNR/DNI would mean. She verbalized understanding and has requested to have her code status changed accordingly. Education provided on completed DNR form and bracelet would be applied by nursing staff. She verbalized understanding and appreciation.   Given her concerns with her wishes not being followed if she was unable to speak on her behalf I also offered to complete a MOST form. MOST form reviewed and she requested to complete. Completed MOST form placed on her chart indicating her selections for DNR, limited use of IV fluids for timed trial, ABX as indicated, no PEG.   Hospice and Palliative Care services outpatient were explained and offered. Patient verbalized her understanding and awareness of both palliative and hospice's goals and philosophy of care. She verbalizes she is not ready for hospice however is open to outpatient palliative support.  Questions and concerns were addressed.  Hard Choices booklet left for review. The family was encouraged to call with questions or concerns.  PMT will continue to support holistically.   SOCIAL HISTORY:     reports that she has quit smoking. Her smoking use included cigarettes. She has a 24.00 pack-year smoking history. She has never used smokeless tobacco. She reports that she does not drink alcohol or use drugs.  CODE STATUS: DNR  ADVANCE DIRECTIVES: Self and son, Nicole Sellers in the setting patient could not speak for herself. However she would like her expressed wishes followed as documented with no exceptions.      SYMPTOM MANAGEMENT: per attending   Palliative Prophylaxis:   Frequent Pain Assessment  PSYCHO-SOCIAL/SPIRITUAL:  Support System: Family  Desire for further Chaplaincy support: YES  Additional Recommendations (Limitations, Scope, Preferences):  Full Scope Treatment, No Artificial Feeding, No Blood Transfusions, No Hemodialysis and DNR   PAST MEDICAL HISTORY: Past Medical History:  Diagnosis Date  . Age-related nuclear cataract, right eye   . Allergy   . Anemia   . Arthritis    "joints sometimes" (12/25/2012)  . Clotting disorder ([place])   . Colon cancer ([place])   . Critical lower limb ischemia   . Diverticulosis   . Gangrene of toe, Rt second toe 12/25/2012  . GERD (gastroesophageal reflux disease)   . High cholesterol   . Hypertension   . Hypothyroidism   . PAD (peripheral artery disease) ([place])   . PAD (peripheral artery disease) ([place])   . Peripheral neuropathy   . Rectal cancer ([place])   . S/P arterial stent, 12/25/13, successful diamondback orbital rotational arthrectomy, PTA using chocolate  balloon and stenting using I DEV stent of long segment calcified high-grade proximal and mid r 12/25/2012  . Tobacco abuse   . Type II diabetes mellitus ([place])     PAST SURGICAL HISTORY:  Past Surgical History:  Procedure Laterality Date  . ABDOMINAL AORTAGRAM  12/20/2012   Procedure: ABDOMINAL AORTAGRAM;  Surgeon: Lorretta Harp, MD;  Location: [facility] CATH LAB;  Service: Cardiovascular;;  . ABDOMINAL HYSTERECTOMY  2000  . ANGIOPLASTY / STENTING FEMORAL Right 12/25/2012  . ATHERECTOMY Right 12/25/2012   Procedure: ATHERECTOMY;  Surgeon: Lorretta Harp, MD;  Location: [facility] CATH LAB;  Service: Cardiovascular;  Laterality: Right;  right SFA  . COLONOSCOPY    . DIVERTING ILEOSTOMY N/A 08/03/2018   Procedure: DIVERTING LOOP ILEOSTOMY;  Surgeon: Leighton Ruff, MD;  Location: [facility] ORS;  Service: General;  Laterality: N/A;  . ESOPHAGOGASTRODUODENOSCOPY N/A 10/04/2018   Procedure:  ESOPHAGOGASTRODUODENOSCOPY (EGD);  Surgeon: Carol Ada, MD;  Location: [facility] ENDOSCOPY;  Service: Endoscopy;  Laterality: N/A;  . EYE SURGERY Left    cataract  . HERNIA REPAIR    . LOWER EXTREMITY ANGIOGRAM N/A 12/20/2012   Procedure: LOWER EXTREMITY ANGIOGRAM;  Surgeon: Lorretta Harp, MD;  Location: [facility] CATH LAB;  Service: Cardiovascular;  Laterality: N/A;  . LOWER EXTREMITY ANGIOGRAM N/A 12/25/2012   Procedure: LOWER EXTREMITY ANGIOGRAM;  Surgeon: Lorretta Harp, MD;  Location: [facility] CATH LAB;  Service: Cardiovascular;  Laterality: N/A;  . LOWER EXTREMITY INTERVENTION  05/08/2017  . LOWER EXTREMITY INTERVENTION Bilateral 05/08/2017   Procedure: LOWER EXTREMITY INTERVENTION;  Surgeon: Lorretta Harp, MD;  Location: [place] CV LAB;  Service: Cardiovascular;  Laterality: Bilateral;  . PERIPHERAL VASCULAR BALLOON ANGIOPLASTY Right 05/08/2017   Procedure: PERIPHERAL VASCULAR BALLOON ANGIOPLASTY;  Surgeon: Lorretta Harp, MD;  Location: [place] CV LAB;  Service: Cardiovascular;  Laterality: Right;  sfa  . PERIPHERAL VASCULAR INTERVENTION Right 05/08/2017   Procedure: PERIPHERAL VASCULAR INTERVENTION;  Surgeon: Lorretta Harp, MD;  Location: [place] CV LAB;  Service: Cardiovascular;  Laterality: Right;  ext iliac  . PORTACATH PLACEMENT Right 11/22/2017   Procedure: INSERTION PORT-A-CATH;  Surgeon: Leighton Ruff, MD;  Location: [facility] ORS;  Service: General;  Laterality: Right;  . SAVORY DILATION N/A 10/04/2018   Procedure: SAVORY DILATION;  Surgeon: Carol Ada, MD;  Location: [facility] ENDOSCOPY;  Service: Endoscopy;  Laterality: N/A;  . TOE SURGERY Right    2n toe   . UMBILICAL HERNIA REPAIR  2000  . UMBILICAL HERNIA REPAIR N/A 08/03/2018   Procedure: UMBILICAL HERNIA REPAIR;  Surgeon: Leighton Ruff, MD;  Location: [facility] ORS;  Service: General;  Laterality: N/A;  . XI ROBOTIC ASSISTED LOWER ANTERIOR RESECTION N/A 08/03/2018   Procedure: XI ROBOTIC ASSISTED LOWER ANTERIOR RESECTION;  DIAGNOSTIC FLEXIBLE SIGMOIDOSCOPY; INTRAOPERATIVE ASSESSMENT OF VASCULAR PERFUSION;  Surgeon: Leighton Ruff, MD;  Location: [facility] ORS;  Service: General;  Laterality: N/A;    ALLERGIES:  is allergic to kiwi extract; lisinopril; and plavix [clopidogrel].   MEDICATIONS:  Current Facility-Administered Medications  Medication Dose Route Frequency Provider Last Rate Last Admin  . acetaminophen (TYLENOL) tablet 650 mg  650 mg Oral Q6H PRN Etta Quill, DO   650 mg at 04/02/19 0414   Or  . acetaminophen (TYLENOL) suppository 650 mg  650 mg Rectal Q6H PRN Etta Quill, DO      . aspirin chewable tablet 81 mg  81 mg Oral Daily Jennette Kettle M, DO   81 mg at 04/02/19 1111  . atorvastatin (LIPITOR) tablet 10 mg  10 mg Oral QHS [place] Eric J, DO   10 mg at 04/01/19 2113  . Chlorhexidine Gluconate Cloth 2 % PADS 6 each  6 each Topical Daily [place] Eric J, DO   6 each at 04/02/19 1112  . gabapentin (NEURONTIN) capsule 300 mg  300 mg Oral BID Jennette Kettle M, DO   300 mg at 04/02/19 1111  . heparin ADULT infusion 100 units/mL (25000 units/290m sodium chloride 0.45%)  700 Units/hr Intravenous Continuous [place] Eric J, DO 7 mL/hr at 04/01/19 1648 700 Units/hr at 04/01/19 1648  . hydrALAZINE (APRESOLINE) tablet 25 mg  25 mg Oral Q8H PRN [place] Eric J, DO      . insulin aspart (novoLOG) injection 0-5 Units  0-5 Units Subcutaneous QHS GAlcario Drought Jared M, DO      . insulin aspart (novoLOG) injection 0-9 Units  0-9 Units Subcutaneous TID WC GEtta Quill DO   2 Units at 04/02/19 0747  . iron polysaccharides (NIFEREX) capsule 150 mg  150 mg Oral BID GJennette KettleM, DO   150 mg at 04/02/19 1110  . levothyroxine (SYNTHROID) tablet 50 mcg  50 mcg Oral Q0600 GEtta Quill DO   50 mcg at 04/02/19 0540  . ondansetron (ZOFRAN) tablet 4 mg  4 mg Oral Q6H PRN GEtta Quill DO       Or  . ondansetron ([facility] injection 4 mg  4 mg Intravenous Q6H PRN GEtta Quill DO      . pantoprazole  (PROTONIX) EC tablet 40 mg  40 mg Oral QAC breakfast GJennette KettleM, DO   40 mg at 04/02/19 0745  . sodium chloride flush (NS) 0.9 % injection 10-40 mL  10-40 mL Intracatheter Q12H [place] Eric J, DO   10 mL at 04/02/19 1113  . sodium chloride  flush (NS) 0.9 % injection 10-40 mL  10-40 mL Intracatheter PRN [place] Eric J, DO      . sodium zirconium cyclosilicate (LOKELMA) packet 10 g  10 g Oral TID Justin Mend, MD   10 g at 04/02/19 0745  . traMADol (ULTRAM) tablet 50 mg  50 mg Oral Q12H PRN [place] Eric J, DO   50 mg at 04/02/19 1137   Facility-Administered Medications Ordered in Other Encounters  Medication Dose Route Frequency Provider Last Rate Last Admin  . 0.9 %  sodium chloride infusion   Intravenous Continuous Leighton Ruff, MD      . acetaminophen (TYLENOL) tablet 650 mg  650 mg Oral X3A PRN Leighton Ruff, MD       Or  . acetaminophen (TYLENOL) suppository 650 mg  650 mg Rectal T5T PRN Leighton Ruff, MD      . diphenhydrAMINE (BENADRYL) 12.5 MG/5ML elixir 12.5 mg  12.5 mg Oral D3U PRN Leighton Ruff, MD       Or  . diphenhydrAMINE (BENADRYL) injection 12.5 mg  12.5 mg Intravenous K0U PRN Leighton Ruff, MD      . heparin lock flush 100 unit/mL  500 Units Intracatheter Once PRN Alla Feeling, NP      . ondansetron (ZOFRAN-ODT) disintegrating tablet 4 mg  4 mg Oral R4Y PRN Leighton Ruff, MD       Or  . ondansetron [facility]) 4 mg in sodium chloride 0.9 % 50 mL IVPB  4 mg Intravenous H0W PRN Leighton Ruff, MD      . polycarbophil (FIBERCON) tablet 625 mg  625 mg Oral TID Leighton Ruff, MD      . sodium chloride 0.9 % bolus 1,000 mL  1,000 mL Intravenous Once Leighton Ruff, MD      . sodium chloride flush (NS) 0.9 % injection 10 mL  10 mL Intracatheter Once PRN Alla Feeling, NP        VITAL SIGNS: BP (!) 162/59 (BP Location: Right Arm)   Pulse 82   Temp 97.8 F (36.6 C) (Oral)   Resp 18   Ht 5' 6.5" (1.689 m)   Wt 70.1 kg   SpO2 98%   BMI 24.57 kg/m  Filed  Weights   03/29/19 1937 03/31/19 0500 04/01/19 0500  Weight: 72.6 kg 68.7 kg 70.1 kg    Estimated body mass index is 24.57 kg/m as calculated from the following:   Height as of this encounter: 5' 6.5" (1.689 m).   Weight as of this encounter: 70.1 kg.  LABS: CBC:    Component Value Date/Time   WBC 2.9 (L) 04/02/2019 0323   HGB 6.3 (LL) 04/02/2019 0323   HGB 8.2 (L) 11/15/2018 1443   HGB 8.7 (L) 08/10/2017 1125   HCT 20.8 (L) 04/02/2019 0323   HCT 26.6 (L) 08/10/2017 1125   PLT 275 04/02/2019 0323   PLT 335 11/15/2018 1443   PLT 537 (H) 08/10/2017 1125   Comprehensive Metabolic Panel:    Component Value Date/Time   NA 138 04/02/2019 0323   NA 136 08/10/2017 1125   K 6.0 (H) 04/02/2019 0323   CO2 19 (L) 04/02/2019 0323   BUN 46 (H) 04/02/2019 0323   BUN 13 08/10/2017 1125   CREATININE 3.51 (H) 04/02/2019 0323   CREATININE 2.14 (H) 11/15/2018 1443   CREATININE 0.80 11/27/2014 1043   ALBUMIN 3.4 (L) 11/15/2018 1443   ALBUMIN 4.2 08/10/2017 1125     Review of Systems  Cardiovascular: Positive for leg  swelling.  Neurological: Positive for weakness.  Unless otherwise noted, a complete review of systems is negative.  Physical Exam General: NAD, chronically-ill appearing Cardiovascular: regular rate and rhythm Pulmonary: clear ant fields Abdomen: soft, nontender, + bowel sounds, colostomy in place with good output Extremities: bilateral lower leg edema, no joint deformities Skin: no rashes, ulcerations to lower L extremity, bilateral discoloration to lower extrem Neurological: A&O x3, mood appropriate, follows commands   Prognosis: Guarded to Poor   Discharge Planning:  To Be Determined with outpatient Palliative support  Recommendations:  DNR/DNI-as requested by patient (DNR and MOST Form completed and placed on chart)  Patient requesting assistance with completion of AD, naming son Harrell Gave as HCPOA with exception of no alterations in her expressed wishes  (DNR, no HD, no PEG, no blood transfusions). Spiritual Care consult placed  Continue current plan of care per medical team  Patient remains hopeful for improvement but also prepared for the worst/further health decline in the setting of her declination for blood transfusion or HD if required.   Agreeable to outpatient palliative support at minimum  PMT will continue to support and follow    Palliative Performance Scale: PPS 30%               Patient expressed understanding and was in agreement with this plan.   Thank you for allowing the Palliative Medicine Team to assist in the care of this patient.  Time In: 1045 Time Out: 1155 Time Total: 65 min.   Visit consisted of counseling and education dealing with the complex and emotionally intense issues of symptom management and palliative care in the setting of serious and potentially life-threatening illness.Greater than 50%  of this time was spent counseling and coordinating care related to the above assessment and plan.  Signed by:  Alda Lea, AGPCNP-BC Palliative Medicine Team  Phone: [phone] Fax: [phone] Pager: [phone] Amion: Bjorn Pippin

## 2019-04-02 NOTE — Progress Notes (Signed)
PROGRESS NOTE    Nicole Sellers  OEU:[id number] DOB: 04-22-1950 DOA: 03/29/2019 PCP: Reynold Bowen, MD    Brief Narrative:  Nicole Sellers is a 69 year old African-American female with past medical history remarkable for stage III rectal cancer diagnosed 2019 status post chemotherapy/resection, PAD, T2DM, peripheral neuropathy, DVT Dx 11/2018 untreated who presented to the ED at the request of her PCP for abnormal labs with an elevated creatinine level.  Patient diagnosed with DVT in November 2020 has apparently not been treated, has been lost to follow-up in regards to general surgery and oncology.  Also patient follows with cardiology, Dr. Gwenlyn Found in regards to her peripheral artery disease.  Recent ultrasound arterial Dopplers notable for occluded left SFA stent, in which patient has outpatient appointment scheduled on 04/01/2019 with Dr. Gwenlyn Found.  In the ED, creatinine was noted to be 5.04; which is up from 2.1 in November 2020 and 1.0 in September 2020.  Hemoglobin 7.8.  Renal ultrasound suboptimal view of left kidney but otherwise unremarkable with no obstruction/masses appreciated.  Given her acute renal failure, hospital service was consulted for admission and further evaluation and work-up.   Assessment & Plan:   Principal Problem:   AKI (acute kidney injury) ([place]) Active Problems:   Essential hypertension   Non-insulin treated type 2 diabetes mellitus ([place])   PAD (peripheral artery disease) Rt ABI 0.7, Lt ABI 0.46   Rectal cancer (H[facility])   Acute kidney injury ([place])   DVT (deep venous thrombosis) ([facility])   Acute renal failure Creatinine elevated to 5.01 on admission, up from 2.14 11/2018 and 1.05 09/2018.  Renal ultrasound with suboptimal views of left kidney but otherwise unremarkable without findings of obstruction or mass.  Is unremarkable. FeNa = 0.4% consistent with prerenal azotemia, although on diuretic therapy with Maxide which could alter result. FeUrea 36.5%, which is  consistent with intrinsic renal disease.  CT abdomen/pelvis negative for hydronephrosis or urinary obstruction. --Nephrology following, appreciate assistance --Cr 5.04-->4.60-->3.72-->3.53-->3.51 --Holding home Maxide, Metformin, glimepiride --Avoid nephrotoxins, renally dose all medications --Follow renal function daily  DVT Patient originally diagnosed with DVT right popliteal vein, right peroneal veins with age-indeterminate DVT right tibioperoneal trunk in November 2020.  Apparently has not been treated with anticoagulation.  Continues with bilateral lower extremity edema. Venous duplex ultrasound bilateral lower extremities positive for DVT right popliteal vein, right posterior tibial veins, right peroneal vein --Continue heparin drip until renal function improves prior to transitioning to Eliquis (pharmacy not comfortable transitioning to NOAC at this time until renal function improves more, would be a very poor candidate for Coumadin with her history of failure to follow-up outpatient)  Essential hypertension Patient on triamterene-HCTZ 75-50 mg p.o. daily at home. --Holding for acute kidney failure as above --Hydralazine '25mg'$  PO q8h prn for SBP >170  Peripheral arterial disease Follows with cardiology, Dr. Gwenlyn Found outpatient.  Recent arterial vascular ultrasound 03/25/2019 with occluded left SFA stent, bilateral occlusion peroneal artery.  Discussed case with Dr. Gwenlyn Found, without critical limb ischemia or ulcerations, no indication for acute intervention at this time recommend continued outpatient follow-up.  Patient has follow-up scheduled in cardiology clinic with Dr. Gwenlyn Found. --Continue aspirin 81 mg p.o. daily  Type 2 diabetes mellitus Hemoglobin A1c 5.4, well controlled.  On Metformin 1000 mg twice daily, and glimepiride 1 mg p.o. twice daily at home. --will hold oral hypoglycemics in the setting of acute renal failure as above --Insulin sliding scale for further coverage  GERD:  Continue PPI  Hypothyroidism: Continue levothyroxine 50 mcg p.o.  daily  Iron deficiency anemia/anemia of chronic disease:  Hgb 7.6-->7.0-->6.7-->6.3.  Anemia work-up with iron 220, TIBC 241, ferritin 417, B12 193, consistent with anemia of chronic disease likely secondary to renal failure. --Continues to decline blood transfusions; discussed with patient once again extensively that with continued trending down of hemoglobin she is at risk for adverse events such as cardiac ischemia/MI, cardiopulmonary arrest; she continues to wish not to receive any blood products.  Blood products refusal form signed and placed in chart. --Palliative care consultation for assistance with goals of care/medical decision making pending  HLD: Continue atorvastatin 10 mg p.o. daily  Peripheral neuropathy: Continue gabapentin 300 mg p.o. twice daily  DVT prophylaxis: Heparin drip Code Status: Full code Family Communication: Updated patient extensively at bedside Disposition Plan:      Patient is from: Home     Anticipated Disposition:  Home     Barriers to discharge or conditions that needs to be met prior to discharge: Improvement of renal function back to baseline; pending palliative care today for assistance with goals of care and medical decision making as she has refused intervention with blood transfusion  Consultants:   Nephrology - Dr. Jonnie Finner  Cardiology - Discussed with Dr. Gwenlyn Found on 3/20  Palliative care - pending consultation  Procedures:  Echocardiogram 03/31/2019 IMPRESSIONS    1. Left ventricular ejection fraction, by estimation, is 50 to 55%. The  left ventricle has low normal function. The left ventricle demonstrates  regional wall motion abnormalities (see scoring diagram/findings for  description). There is mild asymmetric  left ventricular hypertrophy of the septal segment. Left ventricular  diastolic parameters are consistent with Grade I diastolic dysfunction  (impaired  relaxation).  2. Right ventricular systolic function is normal. The right ventricular  size is normal. There is mildly elevated pulmonary artery systolic  pressure.  3. The mitral valve is normal in structure. No evidence of mitral valve  regurgitation. No evidence of mitral stenosis.  4. The aortic valve is normal in structure. Aortic valve regurgitation is  not visualized. Mild aortic valve sclerosis is present, with no evidence  of aortic valve stenosis.  5. The inferior vena cava is dilated in size with >50% respiratory  variability, suggesting right atrial pressure of 8 mmHg.    Antimicrobials:   None   Subjective: Patient seen and examined bedside, resting comfortably.  No specific complaint this morning.  Hemoglobin down to 6.3, discussed with patient need for blood transfusion and discussed extensively consequences of continued decline of hemoglobin, she declines once again today.  Renal function slightly improved, creatinine 3.51 today.  States swelling improved bilateral lower extremities.  No other complaints or concerns at this time. Denies headache, no visual changes, no chest pain, palpitations, no shortness of breath, no abdominal pain, no weakness, no fatigue.  No acute events overnight per nursing staff.  Objective: Vitals:   04/01/19 1619 04/01/19 2300 04/02/19 0400 04/02/19 0804  BP: 133/64  128/68 (!) 162/59  Pulse: 85  80 82  Resp: '17  14 18  '$ Temp: 98.2 F (36.8 C)  98.5 F (36.9 C) 97.8 F (36.6 C)  TempSrc: Oral  Oral Oral  SpO2: 100% 100% 100% 98%  Weight:      Height:        Intake/Output Summary (Last 24 hours) at 04/02/2019 0805 Last data filed at 04/01/2019 1300 Gross per 24 hour  Intake 360 ml  Output --  Net 360 ml   Filed Weights   03/29/19 1937 03/31/19  0500 04/01/19 0500  Weight: 72.6 kg 68.7 kg 70.1 kg    Examination:  General exam: Appears calm and comfortable  Respiratory system: Clear to auscultation. Respiratory effort  normal.  Oxygenating well on room air Cardiovascular system: S1 & S2 heard, RRR. No JVD, murmurs, rubs, gallops or clicks.  3+ pedal edema bilaterally to level of knee, R>L Gastrointestinal system: Abdomen is nondistended, soft and nontender. No organomegaly or masses felt. Normal bowel sounds heard. Central nervous system: Alert and oriented. No focal neurological deficits. Extremities: Symmetric 5 x 5 power. Skin: No rashes, lesions or ulcers Psychiatry: Judgement and insight appear poor. Mood & affect appropriate.     Data Reviewed: I have personally reviewed following labs and imaging studies  CBC: Recent Labs  Lab 03/29/19 1953 03/30/19 0346 03/31/19 0338 04/01/19 0455 04/02/19 0323  WBC 3.9* 3.8* 3.1* 2.8* 2.9*  HGB 7.8* 7.6* 7.0* 6.7* 6.3*  HCT 25.0* 23.6* 22.8* 21.4* 20.8*  MCV 98.0 98.3 98.7 98.6 100.5*  PLT 338 324 313 305 841   Basic Metabolic Panel: Recent Labs  Lab 03/29/19 1953 03/30/19 0346 03/31/19 0338 04/01/19 0455 04/02/19 0323  NA 137 135 136 139 138  K 5.1 4.7 4.7 5.2* 6.0*  CL 105 105 110 113* 115*  CO2 20* 20* 19* 20* 19*  GLUCOSE 146* 192* 102* 95 98  BUN 65* 61* 52* 46* 46*  CREATININE 5.04* 4.60* 3.72* 3.53* 3.51*  CALCIUM 9.6 9.3 9.0 9.1 8.9   GFR: Estimated Creatinine Clearance: 14 mL/min (A) (by C-G formula based on SCr of 3.51 mg/dL (H)). Liver Function Tests: No results for input(s): AST, ALT, ALKPHOS, BILITOT, PROT, ALBUMIN in the last 168 hours. No results for input(s): LIPASE, AMYLASE in the last 168 hours. No results for input(s): AMMONIA in the last 168 hours. Coagulation Profile: No results for input(s): INR, PROTIME in the last 168 hours. Cardiac Enzymes: No results for input(s): CKTOTAL, CKMB, CKMBINDEX, TROPONINI in the last 168 hours. BNP (last 3 results) No results for input(s): PROBNP in the last 8760 hours. HbA1C: No results for input(s): HGBA1C in the last 72 hours. CBG: Recent Labs  Lab 04/01/19 1637  04/01/19 2018 04/02/19 0016 04/02/19 0641 04/02/19 0719  GLUCAP 153* 90 143* 100* 162*   Lipid Profile: No results for input(s): CHOL, HDL, LDLCALC, TRIG, CHOLHDL, LDLDIRECT in the last 72 hours. Thyroid Function Tests: No results for input(s): TSH, T4TOTAL, FREET4, T3FREE, THYROIDAB in the last 72 hours. Anemia Panel: Recent Labs    03/31/19 0836  VITAMINB12 193  FERRITIN 417*  TIBC 241*  IRON 220*   Sepsis Labs: No results for input(s): PROCALCITON, LATICACIDVEN in the last 168 hours.  Recent Results (from the past 240 hour(s))  SARS CORONAVIRUS 2 (TAT 6-24 HRS) Nasopharyngeal Nasopharyngeal Swab     Status: None   Collection Time: 03/29/19 10:38 PM   Specimen: Nasopharyngeal Swab  Result Value Ref Range Status   SARS Coronavirus 2 NEGATIVE NEGATIVE Final    Comment: (NOTE) SARS-CoV-2 target nucleic acids are NOT DETECTED. The SARS-CoV-2 RNA is generally detectable in upper and lower respiratory specimens during the acute phase of infection. Negative results do not preclude SARS-CoV-2 infection, do not rule out co-infections with other pathogens, and should not be used as the sole basis for treatment or other patient management decisions. Negative results must be combined with clinical observations, patient history, and epidemiological information. The expected result is Negative. Fact Sheet for Patients: SugarRoll.be Fact Sheet for Healthcare Providers: https://www.woods-mathews.com/ This test  is not yet approved or cleared by the [place] and  has been authorized for detection and/or diagnosis of SARS-CoV-2 by FDA under an Emergency Use Authorization (EUA). This EUA will remain  in effect (meaning this test can be used) for the duration of the COVID-19 declaration under Section 56 4(b)(1) of the Act, 21 U.S.C. section 360bbb-3(b)(1), unless the authorization is terminated or revoked sooner. Performed at [facility], [place] [address] [place], [place] [address]          Radiology Studies: ECHOCARDIOGRAM COMPLETE  Result Date: 03/31/2019    ECHOCARDIOGRAM REPORT   Patient Name:   Nicole Sellers Date of Exam: 03/31/2019 Medical Rec #:  [id number]        Height:       66.5 in Accession #:    [id number]       Weight:       151.5 lb Date of Birth:  April 29, 1950        BSA:          1.787 m Patient Age:    78 years         BP:           153/84 mmHg Patient Gender: F                HR:           70 bpm. Exam Location:  Inpatient Procedure: 2D Echo, Cardiac Doppler and Color Doppler Indications:    Dyspnea 786.09/R06.00  History:        Patient has no prior history of Echocardiogram examinations.                 Risk Factors:Hypertension, Diabetes and Former Smoker. PAD. DVT.  Sonographer:    Clayton Lefort RDCS (AE) Referring Phys: [id number] Barbie Croston J [place] IMPRESSIONS  1. Left ventricular ejection fraction, by estimation, is 50 to 55%. The left ventricle has low normal function. The left ventricle demonstrates regional wall motion abnormalities (see scoring diagram/findings for description). There is mild asymmetric left ventricular hypertrophy of the septal segment. Left ventricular diastolic parameters are consistent with Grade I diastolic dysfunction (impaired relaxation).  2. Right ventricular systolic function is normal. The right ventricular size is normal. There is mildly elevated pulmonary artery systolic pressure.  3. The mitral valve is normal in structure. No evidence of mitral valve regurgitation. No evidence of mitral stenosis.  4. The aortic valve is normal in structure. Aortic valve regurgitation is not visualized. Mild aortic valve sclerosis is present, with no evidence of aortic valve stenosis.  5. The inferior vena cava is dilated in size with >50% respiratory variability, suggesting right atrial pressure of 8 mmHg. FINDINGS  Left Ventricle: Left ventricular ejection fraction, by estimation, is 50 to 55%.  The left ventricle has low normal function. The left ventricle demonstrates regional wall motion abnormalities. The left ventricular internal cavity size was normal in size. There is mild asymmetric left ventricular hypertrophy of the septal segment. Left ventricular diastolic parameters are consistent with Grade I diastolic dysfunction (impaired relaxation).  LV Wall Scoring: The mid anteroseptal segment and mid inferoseptal segment are akinetic. Right Ventricle: The right ventricular size is normal. No increase in right ventricular wall thickness. Right ventricular systolic function is normal. There is mildly elevated pulmonary artery systolic pressure. The tricuspid regurgitant velocity is 2.41  m/s, and with an assumed right atrial pressure of 8 mmHg, the estimated right ventricular systolic pressure is 29.5 mmHg. Left  Atrium: Left atrial size was normal in size. Right Atrium: Right atrial size was normal in size. Pericardium: There is no evidence of pericardial effusion. Mitral Valve: The mitral valve is normal in structure. Normal mobility of the mitral valve leaflets. Mild mitral annular calcification. No evidence of mitral valve regurgitation. No evidence of mitral valve stenosis. Tricuspid Valve: The tricuspid valve is normal in structure. Tricuspid valve regurgitation is trivial. No evidence of tricuspid stenosis. Aortic Valve: The aortic valve is normal in structure.. There is mild thickening and mild calcification of the aortic valve. Aortic valve regurgitation is not visualized. Mild aortic valve sclerosis is present, with no evidence of aortic valve stenosis. There is mild thickening of the aortic valve. There is mild calcification of the aortic valve. Pulmonic Valve: The pulmonic valve was normal in structure. Pulmonic valve regurgitation is not visualized. No evidence of pulmonic stenosis. Aorta: The aortic root is normal in size and structure. Venous: The inferior vena cava is dilated in size with  greater than 50% respiratory variability, suggesting right atrial pressure of 8 mmHg. IAS/Shunts: No atrial level shunt detected by color flow Doppler.  LEFT VENTRICLE PLAX 2D LVIDd:         3.86 cm  Diastology LVIDs:         2.76 cm  LV e' lateral:   7.51 cm/s LV PW:         1.14 cm  LV E/e' lateral: 13.2 LV IVS:        1.36 cm  LV e' medial:    5.33 cm/s LVOT diam:     1.90 cm  LV E/e' medial:  18.6 LV SV:         65 LV SV Index:   36 LVOT Area:     2.84 cm  RIGHT VENTRICLE             IVC RV Basal diam:  2.48 cm     IVC diam: 2.34 cm RV S prime:     13.30 cm/s TAPSE (M-mode): 2.2 cm LEFT ATRIUM             Index       RIGHT ATRIUM           Index LA diam:        2.50 cm 1.40 cm/m  RA Area:     12.20 cm LA Vol (A2C):   57.6 ml 32.23 ml/m RA Volume:   26.10 ml  14.61 ml/m LA Vol (A4C):   48.2 ml 26.97 ml/m LA Biplane Vol: 52.6 ml 29.44 ml/m  AORTIC VALVE LVOT Vmax:   90.10 cm/s LVOT Vmean:  57.400 cm/s LVOT VTI:    0.230 m  AORTA Ao Root diam: 3.00 cm Ao Asc diam:  3.10 cm MITRAL VALVE                TRICUSPID VALVE MV Area (PHT): 3.60 cm     TR Peak grad:   23.2 mmHg MV Decel Time: 211 msec     TR Vmax:        241.00 cm/s MV E velocity: 99.10 cm/s MV A velocity: 152.00 cm/s  SHUNTS MV E/A ratio:  0.65         Systemic VTI:  0.23 m                             Systemic Diam: 1.90 cm Candee Furbish MD Electronically signed by Candee Furbish MD  Signature Date/Time: 03/31/2019/12:23:02 PM    Final         Scheduled Meds: . aspirin  81 mg Oral Daily  . atorvastatin  10 mg Oral QHS  . Chlorhexidine Gluconate Cloth  6 each Topical Daily  . gabapentin  300 mg Oral BID  . insulin aspart  0-5 Units Subcutaneous QHS  . insulin aspart  0-9 Units Subcutaneous TID WC  . iron polysaccharides  150 mg Oral BID  . levothyroxine  50 mcg Oral Q0600  . pantoprazole  40 mg Oral QAC breakfast  . sodium chloride flush  10-40 mL Intracatheter Q12H  . sodium zirconium cyclosilicate  10 g Oral TID   Continuous  Infusions: . calcium gluconate 1,000 mg (04/02/19 0751)  . heparin 700 Units/hr (04/01/19 1648)     LOS: 4 days    Time spent: 34 minutes spent on chart review, discussion with nursing staff, consultants, updating family and interview/physical exam; more than 50% of that time was spent in counseling and/or coordination of care.    Providencia Hottenstein J [place] DO [facility] Available via Epic secure chat 7am-7pm After these hours, please refer to coverage provider listed on [email or web address] 04/02/2019, 8:05 AM

## 2019-04-02 NOTE — Progress Notes (Signed)
ANTICOAGULATION CONSULT NOTE - Follow Up Consult  Pharmacy Consult for heparin Indication: DVT  Labs: Recent Labs    03/31/19 0338 03/31/19 0338 03/31/19 1416 03/31/19 2219 04/01/19 0455 04/02/19 0323  HGB 7.0*   < >  --   --  6.7* 6.3*  HCT 22.8*  --   --   --  21.4* 20.8*  PLT 313  --   --   --  305 275  HEPARINUNFRC 0.74*  --    < > 0.54 0.46 0.57  CREATININE 3.72*  --   --   --  3.53* 3.51*   < > = values in this interval not displayed.    Assessment/Plan:  69yo female remains therapeutic on heparin. Will continue gtt at current rate and monitor level daily.   Refusing transfusions (HgB 6)  Thank you Anette Guarneri, PharmD 04/02/2019,8:24 AM

## 2019-04-02 NOTE — Plan of Care (Signed)
  Problem: Activity: Goal: Risk for activity intolerance will decrease Outcome: Progressing   Problem: Coping: Goal: Level of anxiety will decrease Outcome: Progressing   Problem: Pain Managment: Goal: General experience of comfort will improve Outcome: Progressing   Problem: Safety: Goal: Ability to remain free from injury will improve Outcome: Progressing   Problem: Skin Integrity: Goal: Risk for impaired skin integrity will decrease Outcome: Progressing   

## 2019-04-02 NOTE — Progress Notes (Signed)
[facility] Progress Note  Subjective: creat stable at 3.5 today , No recorded UOP yest but says going. Declining blood transfusion.  Palliative care consulted. Good appetite - eating and drinking freely.   Vitals:   04/01/19 1619 04/01/19 2300 04/02/19 0400 04/02/19 0804  BP: 133/64  128/68 (!) 162/59  Pulse: 85  80 82  Resp: _0 Temp: 98.2 F (36.8 C)  98.5 F (36.9 C) 97.8 F (36.6 C)  TempSrc: Oral  Oral Oral  SpO2: 100% 100% 100% 98%  Weight:      Height:        Exam: Gen no distress, calm, elderly AAF  No jvd Chest clear bilat no rales RRR no MRG  Abd soft ntnd no mass or ascites +bs Ext bilat L > R lower leg edema 2+, some venous stasis appearing ulcers on L tibia, no erythema or drainage Neuro is alert, Ox 3 , nf    Home meds:  - metformin 1gm bid/ glimepiride 73m bid  - norvasc 10/ triamterene-hctz 75-50 qd  - neurontin 300 bid  - prilosec 40 qd/ synthroid 50ug qd  - aspirin 81/ lipitor 10  - prn's/ vitamins/ supplements    Date                           Creat               eGFR   2011- 07/2018             0.8- 1.1   08/2018                       1.5 >> 0.9        AKI mild   09/2018                       4.21 >> 1.05    AKI severe, 12 > 60   11/2018                     2.14                 26 ml/min   Today                        4.60                 10 ml/ min               July 2020 - rectal CA pt admitted for Robotic LAR , diverting ostomy and hernia repair   Aug 2020 - admit for dehydration and FTT, found to have postop SBO, NG placed and TPN given, she improved and GI tract functioned properly. [place].    Sept 2020 - admit for dehydration/ AKI, esophagitis/ dysphagia, creat improved 4.2 > 1.0 at dc; esophagitis rx w/ IV then po PPI and empiric diflucan w/ GI f/u. Hx of CVA w/ R side weakness, chornic    UA 09/2018    20-50 wbc, 6-10 rbc, rare bact, neg protein      09/2018 - UNa <10, UCr 368   UA 03/30/19  6-10 wbc, 0-5 rbc,  rare bact, neg protein      03/30/19 - UNa 12, UCr 102    CT abd noncon 3/20 > 1) Adrenals/Urinary Tract: bilateral nonobstructive nephrolithiasis is noted. No  hydronephrosis or renal obstruction is noted. Urinary bladder is unremarkable. 2) ileostomy RUQ  3) no acute abd /pelvis     Renal US - 9.0/ 9.1 cm kidneys w/o hydro, normal echo     CXR - no active disease    Assessment/ Plan: 1. Renal failure - baseline creat from sept- nov 2020 is 1.0- 2.0.  Pt has had AKI episodes from dehydration/ high output ostomy in 2020 after her cancer surgery. UA and renal US unremarkable but noted low urine sodium and FeNa 0.4% ? Prerenal and was treated with NS bolus 1L on presentation vs ATN.  Pt is voiding but not being recorded. Creat down 5.0 on admit > 4.6 > 3.7 > 3.5 > 3.5 today. Swelling LE's stable c/w yest but overall improved per patient. Getting IV hep for RLE DVT. Has bilat LE edema but hesitant to diurese for this given DVT on one side and improving creatinine. Cont to observe. Labs in am.   May be prudent to hold diuretic at discharge 2. RLE DVT - multiple DVT's RLE, on IV hep per pmd 3. Rectal CA - sp chemo 2019-20 and LAR/ ileostomy in mid 2020 4. HTN -  BP's variable but generally ok.  5. DM2 - on po meds at home.  Per primay. 6. Smoker 7. PAD - of LE's, has occluded L SFA stent   Jannifer Hick MD [facility] Pager [phone]   Recent Labs  Lab 04/01/19 0455 04/02/19 0323  K 5.2* 6.0*  BUN 46* 46*  CREATININE 3.53* 3.51*  CALCIUM 9.1 8.9  HGB 6.7* 6.3*   Inpatient medications: . aspirin  81 mg Oral Daily  . atorvastatin  10 mg Oral QHS  . Chlorhexidine Gluconate Cloth  6 each Topical Daily  . gabapentin  300 mg Oral BID  . insulin aspart  0-5 Units Subcutaneous QHS  . insulin aspart  0-9 Units Subcutaneous TID WC  . iron polysaccharides  150 mg Oral BID  . levothyroxine  50 mcg Oral Q0600  . pantoprazole  40 mg Oral QAC breakfast  . sodium chloride flush   10-40 mL Intracatheter Q12H  . sodium zirconium cyclosilicate  10 g Oral TID   . heparin 700 Units/hr (04/01/19 1648)   acetaminophen **OR** acetaminophen, hydrALAZINE, ondansetron **OR** ondansetron (ZOFRAN) IV, sodium chloride flush, traMADol

## 2019-04-03 ENCOUNTER — Encounter (HOSPITAL_COMMUNITY): Payer: Medicare Other

## 2019-04-03 DIAGNOSIS — N189 Chronic kidney disease, unspecified: Secondary | ICD-10-CM

## 2019-04-03 LAB — CBC
HCT: 21.2 % — ABNORMAL LOW (ref 36.0–46.0)
Hemoglobin: 6.6 g/dL — CL (ref 12.0–15.0)
MCH: 31.4 pg (ref 26.0–34.0)
MCHC: 31.1 g/dL (ref 30.0–36.0)
MCV: 101 fL — ABNORMAL HIGH (ref 80.0–100.0)
Platelets: 295 10*3/uL (ref 150–400)
RBC: 2.1 MIL/uL — ABNORMAL LOW (ref 3.87–5.11)
RDW: 13.9 % (ref 11.5–15.5)
WBC: 3 10*3/uL — ABNORMAL LOW (ref 4.0–10.5)
nRBC: 0 % (ref 0.0–0.2)

## 2019-04-03 LAB — RENAL FUNCTION PANEL
Albumin: 2.7 g/dL — ABNORMAL LOW (ref 3.5–5.0)
Anion gap: 7 (ref 5–15)
BUN: 37 mg/dL — ABNORMAL HIGH (ref 8–23)
CO2: 18 mmol/L — ABNORMAL LOW (ref 22–32)
Calcium: 9.1 mg/dL (ref 8.9–10.3)
Chloride: 111 mmol/L (ref 98–111)
Creatinine, Ser: 3.28 mg/dL — ABNORMAL HIGH (ref 0.44–1.00)
GFR calc Af Amer: 16 mL/min — ABNORMAL LOW (ref >60)
GFR calc non Af Amer: 13 mL/min — ABNORMAL LOW (ref >60)
Glucose, Bld: 112 mg/dL — ABNORMAL HIGH (ref 70–99)
Phosphorus: 3.9 mg/dL (ref 2.5–4.6)
Potassium: 6.1 mmol/L — ABNORMAL HIGH (ref 3.5–5.1)
Sodium: 136 mmol/L (ref 135–145)

## 2019-04-03 LAB — GLUCOSE, CAPILLARY
Glucose-Capillary: 107 mg/dL — ABNORMAL HIGH (ref 70–99)
Glucose-Capillary: 125 mg/dL — ABNORMAL HIGH (ref 70–99)
Glucose-Capillary: 128 mg/dL — ABNORMAL HIGH (ref 70–99)
Glucose-Capillary: 125 mg/dL — ABNORMAL HIGH (ref 70–99)

## 2019-04-03 LAB — HEPARIN LEVEL (UNFRACTIONATED): Heparin Unfractionated: 0.47 IU/mL (ref 0.30–0.70)

## 2019-04-03 MED ORDER — DARBEPOETIN ALFA 60 MCG/0.3ML IJ SOSY: 60.0000 ug | PREFILLED_SYRINGE | INTRAMUSCULAR | Status: DC

## 2019-04-03 MED ORDER — ALBUMIN HUMAN 25 % IV SOLN: 12.5000 g | Freq: Once | INTRAVENOUS | Status: DC

## 2019-04-03 MED ORDER — DARBEPOETIN ALFA 60 MCG/0.3ML IJ SOSY
60.0000 ug | PREFILLED_SYRINGE | INTRAMUSCULAR | Status: DC
  Administered 2019-04-03: 60 ug via SUBCUTANEOUS
  Filled 2019-04-03: qty 0.3

## 2019-04-03 MED ORDER — APIXABAN 5 MG PO TABS
5.0000 mg | ORAL_TABLET | Freq: Two times a day (BID) | ORAL | Status: DC
  Administered 2019-04-03 – 2019-04-04 (×3): 5 mg via ORAL
  Filled 2019-04-03 (×3): qty 1

## 2019-04-03 MED ORDER — SODIUM ZIRCONIUM CYCLOSILICATE 10 G PO PACK
10.0000 g | PACK | Freq: Two times a day (BID) | ORAL | Status: DC
  Administered 2019-04-03 – 2019-04-04 (×3): 10 g via ORAL
  Filled 2019-04-03 (×3): qty 1

## 2019-04-03 NOTE — Progress Notes (Addendum)
ANTICOAGULATION CONSULT NOTE - Follow Up Consult  Pharmacy Consult for heparin Indication: DVT  Labs: Recent Labs    04/01/19 0455 04/01/19 0455 04/02/19 0323 04/02/19 1849 04/03/19 0419  HGB 6.7*   < > 6.3*  --  6.6*  HCT 21.4*  --  20.8*  --  21.2*  PLT 305  --  275  --  295  HEPARINUNFRC 0.46  --  0.57  --  0.47  CREATININE 3.53*   < > 3.51* 3.29* 3.28*   < > = values in this interval not displayed.    Assessment/Plan:  69yo female remains therapeutic on heparin. Will continue gtt at current rate and monitor level daily.   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

## 2019-04-03 NOTE — Discharge Instructions (Signed)
Information on my medicine - ELIQUIS (apixaban)  This medication education was reviewed with me or my healthcare representative as part of my discharge preparation.   Why was Eliquis prescribed for you? Eliquis was prescribed to treat blood clots that may have been found in the veins of your legs (deep vein thrombosis) or in your lungs (pulmonary embolism) and to reduce the risk of them occurring again.  What do You need to know about Eliquis ? The starting dose is 5 mg taken TWICE daily.  Eliquis may be taken with or without food.   Try to take the dose about the same time in the morning and in the evening. If you have difficulty swallowing the tablet whole please discuss with your pharmacist how to take the medication safely.  Take Eliquis exactly as prescribed and DO NOT stop taking Eliquis without talking to the doctor who prescribed the medication.  Stopping may increase your risk of developing a new blood clot.  Refill your prescription before you run out.  After discharge, you should have regular check-up appointments with your healthcare provider that is prescribing your Eliquis.    What do you do if you miss a dose? If a dose of ELIQUIS is not taken at the scheduled time, take it as soon as possible on the same day and twice-daily administration should be resumed. The dose should not be doubled to make up for a missed dose.  Important Safety Information A possible side effect of Eliquis is bleeding. You should call your healthcare provider right away if you experience any of the following: ? Bleeding from an injury or your nose that does not stop. ? Unusual colored urine (red or dark brown) or unusual colored stools (red or black). ? Unusual bruising for unknown reasons. ? A serious fall or if you hit your head (even if there is no bleeding).  Some medicines may interact with Eliquis and might increase your risk of bleeding or clotting while on Eliquis. To help avoid  this, consult your healthcare provider or pharmacist prior to using any new prescription or non-prescription medications, including herbals, vitamins, non-steroidal anti-inflammatory drugs (NSAIDs) and supplements.  This website has more information on Eliquis (apixaban): http://www.eliquis.com/eliquis/home

## 2019-04-03 NOTE — Progress Notes (Addendum)
Chaplain responded to Spiritual Consult for notary of Advance Directive, indicating paperwork at bedside.  Paperwork not completed so took this opportunity to visit. Patient expressed regret over past actions.  Chaplain reminded patient of her baptismal covenant while assuring her our sins are forgiven when we ask for forgiveness.  Patient wonders if God still loves her.  Chaplain drew analogy between the love a mother has for her son as likened to the love God has for [place]. Patient acknowledged there was nothing her son could ever do that could ever separate him from her love.  That's the way God loves [place].   Patient expressed she had never thought about it like that.  Patient wonders what God's plan is for her at this point in her life.  Together we arrived at maybe her purpose right now is to abide  in motherly love that only she can give to her son who is experiencing some difficulty in his personal life right now.  We ended our time together in prayer.  Chaplain advised patient to have her son help her fill out the AD at this next visit and ask staff to alert Chaplain to perform notary.    De Burrs Chaplain Resident

## 2019-04-03 NOTE — Progress Notes (Signed)
[facility] Progress Note  Subjective: creat stable at 3.5 today , No recorded UOP yest but says going and edema improved.  Palliative care consulted yesterday - DNR, no HD, no blood transfusion. Good appetite - eating and drinking freely.  We chatted a bit more -- she lives in apt [address], independent - takes taxi to store, takes med transport or taxi to appts.  Doesn't have anyone helping her with I/ADLs.  Vitals:   04/02/19 1551 04/02/19 2002 04/03/19 0548 04/03/19 0735  BP: (!) 137/57 (!) 165/60 135/67 133/60  Pulse: 83 85 (!) 104 78  Resp: '18 15 16 17  '$ Temp: 97.8 F (36.6 C) 97.9 F (36.6 C) 98.5 F (36.9 C) 98.8 F (37.1 C)  TempSrc: Oral Oral Oral Oral  SpO2: 99% 100% 100% 100%  Weight:      Height:        Exam: Gen no distress, calm, elderly AAF  No jvd Chest clear bilat no rales RRR no MRG  Abd soft ntnd no mass or ascites +bs Ext bilat L > R lower leg edema 1+ - some improvement, some venous stasis appearing ulcers on L tibia, no erythema or drainage - no new ones and the existing ones look a bit better.  Neuro is alert, Ox 3 , nf    Home meds:  - metformin 1gm bid/ glimepiride '1mg'$  bid  - norvasc 10/ triamterene-hctz 75-50 qd  - neurontin 300 bid  - prilosec 40 qd/ synthroid 50ug qd  - aspirin 81/ lipitor 10  - prn's/ vitamins/ supplements    Date                           Creat               eGFR   2011- 07/2018             0.8- 1.1   08/2018                       1.5 >> 0.9        AKI mild   09/2018                       4.21 >> 1.05    AKI severe, 12 > 60   11/2018                     2.14                 26 ml/min   Today                        4.60                 10 ml/ min               July 2020 - rectal CA pt admitted for Robotic LAR , diverting ostomy and hernia repair   Aug 2020 - admit for dehydration and FTT, found to have postop SBO, NG placed and TPN given, she improved and GI tract functioned properly. [place] home.   Sept 2020 - admit for dehydration/ AKI, esophagitis/ dysphagia, creat improved 4.2 > 1.0 at dc; esophagitis rx w/ IV then po PPI and empiric diflucan w/ GI f/u. Hx of CVA w/ R side weakness, chornic    UA 09/2018  20-50 wbc, 6-10 rbc, rare bact, neg protein      09/2018 - UNa <10, UCr 368   UA 03/30/19  6-10 wbc, 0-5 rbc, rare bact, neg protein      03/30/19 - UNa 12, UCr 102    CT abd noncon 3/20 > 1) Adrenals/Urinary Tract: bilateral nonobstructive nephrolithiasis is noted. No hydronephrosis or renal obstruction is noted. Urinary bladder is unremarkable. 2) ileostomy RUQ  3) no acute abd /pelvis     Renal US - 9.0/ 9.1 cm kidneys w/o hydro, normal echo     CXR - no active disease    Assessment/ Plan: 1. Renal failure - baseline creat from sept- nov 2020 is 2.0.  Pt has had AKI episodes from dehydration/ high output ostomy in 2020 after her cancer surgery. UA and renal US unremarkable but noted low urine sodium and FeNa 0.4% ? Prerenal and was treated with NS bolus 1L on presentation vs ATN.  Pt is voiding but not being recorded. Creat down 5.0 on admit > 4.6 > 3.7 > 3.5 > 3.5 > 3.5 today. Swelling LE's stable c/w yest but overall improved per patient.  Has bilat LE edema but hesitant to diurese for this given DVT on one side and improving creatinine. Cont to observe. Labs in am.   May be prudent to hold diuretic at discharge 2. RLE DVT - multiple DVT's RLE, on IV hep per pmd; could consider warfarin vs eliquis on discharge.  3. Rectal CA - sp chemo 2019-20 and LAR/ ileostomy in mid 2020.  In remission per last evaluation. 4. HTN -  BP's variable but generally ok.  5. DM2 - on po meds at home.  Per primay. 6. Smoker 7. PAD - of LE's, has occluded L SFA stent 8. Anemia - Hb in 6s, symptomatic but adamant about no blood transfusion. Iron, B12, folate ok.  Discussed ESA with her and she wishes to proceed.  Will order and arrange outpt ESA. 9. Hyperkalemia - lokelma again today, eating high K  diet (OJ, fruit, potatoes).  Have changed to renal diet and d/w her.   From renal perspective I think she's nearing ok to discharge -- renal function has been stable --> It may improve if this ATN but may not.  As long as K stabilizes she can f/u with me in clinic which I will arrange.  I will continue to follow.  Page with concerns.    Jannifer Hick MD [facility] Pager [phone]   Recent Labs  Lab 04/02/19 [phone] 04/02/19 0323 04/02/19 1849 04/03/19 0419  K 6.0*   < > 5.5* 6.1*  BUN 46*   < > 41* 37*  CREATININE 3.51*   < > 3.29* 3.28*  CALCIUM 8.9   < > 9.0 9.1  PHOS  --   --  3.5 3.9  HGB 6.3*  --   --  6.6*   < > = values in this interval not displayed.   Inpatient medications: . aspirin  81 mg Oral Daily  . atorvastatin  10 mg Oral QHS  . Chlorhexidine Gluconate Cloth  6 each Topical Daily  . gabapentin  300 mg Oral BID  . insulin aspart  0-5 Units Subcutaneous QHS  . insulin aspart  0-9 Units Subcutaneous TID WC  . iron polysaccharides  150 mg Oral BID  . levothyroxine  50 mcg Oral Q0600  . pantoprazole  40 mg Oral QAC breakfast  . sodium chloride flush  10-40 mL Intracatheter Q12H  .  sodium zirconium cyclosilicate  10 g Oral BID   . heparin 700 Units/hr (04/03/19 0755)   acetaminophen **OR** acetaminophen, hydrALAZINE, ondansetron **OR** ondansetron (ZOFRAN) IV, sodium chloride flush, traMADol

## 2019-04-03 NOTE — Progress Notes (Signed)
Daily Progress Note   Patient Name: Nicole Sellers       Date: 04/03/2019 DOB: 12-08-1950  Age: 69 y.o. MRN#: [id number] Attending Physician: Donne Hazel, MD Primary Care Physician: Reynold Bowen, MD Admit Date: 03/29/2019  Reason for Consultation/Follow-up: Establishing goals of care  Subjective: Patient sitting on the side of the bed watching TV. Sad/flat affect today. She denies pain or shortness of breath. States she had a good night and appetite is good. I offered support creating space and opportunity for patient to discuss feelings. She reports she is sad thinking of her relationship with her son and that she may be nearing end-of-life at some point. She is tearful expressing she would not want to leave this world without him knowing how much she truly loves and cares for him. Therapeutic listening and support given.   She states she is hoping to go home soon. Able to appropriately summarize our goals of care discussion on yesterday. Confirms DNR wishes. She states she does not want to discharge without her MOST and DNR form. She is aware the RN will give forms to her at discharge and they are secured on her chart for now. She verbalized understanding and appreciation. She has AD on night stand. We reviewed and I offered to assist with completion. She declines expressing she wanted to go ahead and have her lunch but if not completed by the end of the day or with Chaplain support would like assistance tomorrow. Support given.   All questions answered and support given.   Length of Stay: 5  Current Medications: Scheduled Meds:  . aspirin  81 mg Oral Daily  . atorvastatin  10 mg Oral QHS  . Chlorhexidine Gluconate Cloth  6 each Topical Daily  . darbepoetin (ARANESP) injection -  NON-DIALYSIS  60 mcg Subcutaneous Q Wed-1800  . gabapentin  300 mg Oral BID  . insulin aspart  0-5 Units Subcutaneous QHS  . insulin aspart  0-9 Units Subcutaneous TID WC  . iron polysaccharides  150 mg Oral BID  . levothyroxine  50 mcg Oral Q0600  . pantoprazole  40 mg Oral QAC breakfast  . sodium chloride flush  10-40 mL Intracatheter Q12H  . sodium zirconium cyclosilicate  10 g Oral BID    Continuous Infusions: . heparin 700 Units/hr (04/03/19 0755)  PRN Meds: acetaminophen **OR** acetaminophen, hydrALAZINE, ondansetron **OR** ondansetron (ZOFRAN) IV, sodium chloride flush, traMADol   Vital Signs: BP 133/60 (BP Location: Right Arm)   Pulse 78   Temp 98.8 F (37.1 C) (Oral)   Resp 17   Ht 5' 6.5" (1.689 m)   Wt 70.1 kg   SpO2 100%   BMI 24.57 kg/m  SpO2: SpO2: 100 % O2 Device: O2 Device: Room Air O2 Flow Rate:    Intake/output summary:   Intake/Output Summary (Last 24 hours) at 04/03/2019 1148 Last data filed at 04/02/2019 1600 Gross per 24 hour  Intake 402.08 ml  Output 400 ml  Net 2.08 ml   LBM: Last BM Date: 03/30/19 Baseline Weight: Weight: 72.6 kg Most recent weight: Weight: 70.1 kg       Palliative Assessment/Data: PPS 30%      Patient Active Problem List   Diagnosis Date Noted  . Acute kidney injury ([place]) 03/29/2019  . DVT (deep venous thrombosis) ([place]) 03/29/2019  . Oral candidiasis   . AKI (acute kidney injury) ([place]) 10/01/2018  . Hypokalemia 10/01/2018  . Pyuria 10/01/2018  . Odynophagia 10/01/2018  . Hyponatremia 10/01/2018  . Right sided weakness 10/01/2018  . SBO (small bowel obstruction) ([place]) 08/22/2018  . Dehydration 08/21/2018  . Pre-operative cardiovascular examination 06/07/2018  . Rectal cancer ([place]) 11/06/2017  . Claudication in peripheral vascular disease ([place]) 05/08/2017  . Carotid artery disease ([place]) 04/14/2017  . PAD (peripheral artery disease) Rt ABI 0.7, Lt ABI 0.46 12/25/2012  . S/P arterial stent, 12/25/13,  successful diamondback orbital rotational arthrectomy, PTA using chocolate  balloon and stenting using I DEV stent of long segment calcified high-grade proximal and mid r 12/25/2012  . Gangrene of toe, Rt second toe 12/25/2012  . Essential hypertension 12/13/2012  . Non-insulin treated type 2 diabetes mellitus ([place]) 12/13/2012  . Critical lower limb ischemia 12/13/2012  . Tobacco abuse 12/13/2012    Palliative Care Assessment & Plan    Recommendations/Plan:  Continue current plan of care per medical team  AD at the bedside pending Spiritual Support team to assist with completion.   Outpatient Palliative support at discharge.   PMT will continue to support and follow as needed.   Goals of Care and Additional Recommendations:  Limitations on Scope of Treatment: Full Scope Treatment, No Blood Transfusions, No Hemodialysis and DNR  Code Status:    Code Status Orders  (From admission, onward)         Start     Ordered   04/02/19 1149  Do not attempt resuscitation (DNR)  Continuous    Question Answer Comment  In the event of cardiac or respiratory ARREST Do not call a "code blue"   In the event of cardiac or respiratory ARREST Do not perform Intubation, CPR, defibrillation or ACLS   In the event of cardiac or respiratory ARREST Use medication by any route, position, wound care, and other measures to relive pain and suffering. May use oxygen, suction and manual treatment of airway obstruction as needed for comfort.      04/02/19 1149        Code Status History    Date Active Date Inactive Code Status Order ID Comments User Context   03/29/2019 2317 04/02/2019 1149 Full Code [id number]  Etta Quill, DO ED   10/02/2018 0022 10/06/2018 2252 Full Code [id number]  Toy Baker, MD Inpatient   08/21/2018 1909 09/02/2018 1541 Full Code [id number]  Leighton Ruff, MD Inpatient   08/21/2018 1352 08/21/2018  [facility] Full Code [id number]  Leighton Ruff, MD Outpatient   08/03/2018 1609  08/09/2018 2130 Full Code [id number]  Leighton Ruff, MD Inpatient   05/08/2017 1008 05/09/2017 1501 Full Code [id number]  Lorretta Harp, MD Inpatient   Advance Care Planning Activity       Prognosis:  Guarded   Discharge Planning:  To Be Determined with outpatient palliative support   Care plan was discussed with patient.   Thank you for allowing the Palliative Medicine Team to assist in the care of this patient.  Time Total: 25 min.   Visit consisted of counseling and education dealing with the complex and emotionally intense issues of symptom management and palliative care in the setting of serious and potentially life-threatening illness.Greater than 50%  of this time was spent counseling and coordinating care related to the above assessment and plan.  Alda Lea, AGPCNP-BC  Palliative Medicine Team [phone]   Please contact Palliative Medicine Team phone at [phone] for questions and concerns.

## 2019-04-03 NOTE — TOC Initial Note (Addendum)
Transition of Care [facility]) - Initial/Assessment Note    Patient Details  Name: Nicole Sellers MRN: [id number] Date of Birth: 1950-07-15  [facility] CM/SW Contact:    Sharin Mons, RN Phone Number: [phone] 04/03/2019, 6:52 PM  Clinical Narrative:                 Admitted with abnormal labs / elevated creatinine level, hx of rectal cancer s/p chemotherapy and resection, DM, PAD. From home alone. States independent with ADL's. Owns cane, rolling walker. States has supportive family. Shoshana Johal (Son)     [phone]     Pt high risk for readmission would probably benefit from home health RN,SW.  NCM received consult:Outpatient Palliative at discharge. NCM spoke with pt regarding outpatient palliative care. Pt agreeable. Choice given to pt. Pt selected [facility] to provide services.  NCM will f/u with referral in am.  TOC team following and monitoring for needs......    04/04/2019 @ 1300 Referral made with [facility] for outpatient palliative care per NCM.  Expected Discharge Plan: Home/Self Care(resides alone) Barriers to Discharge: Continued Medical Work up   Patient Goals and CMS Choice        Expected Discharge Plan and Services Expected Discharge Plan: Home/Self Care(resides alone)   Discharge Planning Services: CM Consult     Prior Living Arrangements/Services   Lives with:: Self Patient language and need for interpreter reviewed:: Yes Do you feel safe going back to the place where you live?: Yes      Need for Family Participation in Patient Care: Yes (Comment) Care giver support system in place?: No (comment)   Criminal Activity/Legal Involvement Pertinent to Current Situation/Hospitalization: No - Comment as needed  Activities of Daily Living Home Assistive Devices/Equipment: Walker (specify type), Cane (specify quad or straight) ADL Screening (condition at time of admission) Patient's cognitive ability  adequate to safely complete daily activities?: Yes Is the patient deaf or have difficulty hearing?: No Does the patient have difficulty seeing, even when wearing glasses/contacts?: No Does the patient have difficulty concentrating, remembering, or making decisions?: No Patient able to express need for assistance with ADLs?: Yes Does the patient have difficulty dressing or bathing?: No Independently performs ADLs?: Yes (appropriate for developmental age) Does the patient have difficulty walking or climbing stairs?: Yes Weakness of Legs: Both Weakness of Arms/Hands: None  Permission Sought/Granted   Permission granted to share information with : Yes, Verbal Permission Granted  Share Information with NAME: Toshiye Kever (Son) [phone]           Emotional Assessment Appearance:: Appears older than stated age Attitude/Demeanor/Rapport: Engaged Affect (typically observed): Accepting Orientation: : Oriented to Place, Oriented to  Time, Oriented to Situation, Oriented to Self Alcohol / Substance Use: Not Applicable Psych Involvement: No (comment)  Admission diagnosis:  Peripheral vascular disease ([place]) [I73.9] Acute kidney injury ([place]) [N17.9] Acute renal failure superimposed on chronic kidney disease, unspecified CKD stage, unspecified acute renal failure type ([place]) [N17.9, N18.9] Patient Active Problem List   Diagnosis Date Noted  . Acute kidney injury ([place]) 03/29/2019  . DVT (deep venous thrombosis) ([place]) 03/29/2019  . Oral candidiasis   . AKI (acute kidney injury) ([place]) 10/01/2018  . Hypokalemia 10/01/2018  . Pyuria 10/01/2018  . Odynophagia 10/01/2018  . Hyponatremia 10/01/2018  . Right sided weakness 10/01/2018  . SBO (small bowel obstruction) ([place]) 08/22/2018  . Dehydration 08/21/2018  . Pre-operative cardiovascular examination 06/07/2018  . Rectal cancer ([place]) 11/06/2017  . Claudication in peripheral  vascular disease ([place]) 05/08/2017  . Carotid artery disease  ([place]) 04/14/2017  . PAD (peripheral artery disease) Rt ABI 0.7, Lt ABI 0.46 12/25/2012  . S/P arterial stent, 12/25/13, successful diamondback orbital rotational arthrectomy, PTA using chocolate  balloon and stenting using I DEV stent of long segment calcified high-grade proximal and mid r 12/25/2012  . Gangrene of toe, Rt second toe 12/25/2012  . Essential hypertension 12/13/2012  . Non-insulin treated type 2 diabetes mellitus ([place]) 12/13/2012  . Critical lower limb ischemia 12/13/2012  . Tobacco abuse 12/13/2012   PCP:  Reynold Bowen, MD Pharmacy:   [id number] [place], [place] [address] [place] [address] Phone: [phone] Fax: [phone]  [place], [place] [facility] [address] [place] [address] [place] [address] Phone: [phone] Fax: [phone]     Social Determinants of Health ([place]) Interventions    Readmission Risk Interventions Readmission Risk Prevention Plan 10/02/2018 08/24/2018  Transportation Screening Complete Complete  Medication Review Press photographer) Complete Complete  PCP or Specialist appointment within 3-5 days of discharge Not Complete Not Complete  PCP/Specialist Appt Not Complete comments not ready to dc Not ready for dc  HRI or Home Care Consult Complete Complete  SW Recovery Care/Counseling Consult Complete Not Complete  SW Consult Not Complete Comments - NA  Palliative Care Screening Not Applicable Not [place] Not Applicable Not Applicable  Some recent data might be hidden

## 2019-04-03 NOTE — Progress Notes (Signed)
PROGRESS NOTE    Nicole Sellers  MPN:[id number] DOB: 04/09/1950 DOA: 03/29/2019 PCP: Reynold Bowen, MD    Brief Narrative:  [phone] with hx of rectal cancer s/p chemotherapy and resection, DM, PAD who presented with with new Cr of over 5. Nephrology following. Renal function improved.  Assessment & Plan:   Principal Problem:   AKI (acute kidney injury) ([place]) Active Problems:   Essential hypertension   Non-insulin treated type 2 diabetes mellitus ([place])   PAD (peripheral artery disease) Rt ABI 0.7, Lt ABI 0.46   Rectal cancer (HCC)   Acute kidney injury ([place])   DVT (deep venous thrombosis) (HCC)   Acute renal failure Creatinine elevated to 5.01 on admission, up from 2.14 11/2018 and 1.05 09/2018.  Renal ultrasound with suboptimal views of left kidney but otherwise unremarkable without findings of obstruction or mass.  Is unremarkable. FeNa = 0.4% consistent with prerenal azotemia, although on diuretic therapy with Maxide which could alter result. FeUrea 36.5%, which is consistent with intrinsic renal disease.  CT abdomen/pelvis negative for hydronephrosis or urinary obstruction. --Nephrology following. Per Nephrology, possible d/c if potassium stablizes with close f/u with Nephrology. Currently elevated at over 6 --Cr 5.04-->4.60-->3.72-->3.53-->3.51-->3.28 --Holding home Maxide, Metformin, glimepiride --Avoid nephrotoxins, renally dose all medications --Repeat bmet in AM  DVT --Patient originally diagnosed with DVT right popliteal vein, right peroneal veins with age-indeterminate DVT right tibioperoneal trunk in November 2020.  Apparently has not been treated with anticoagulation.  Continues with bilateral lower extremity edema. Venous duplex ultrasound bilateral lower extremities positive for DVT right popliteal vein, right posterior tibial veins, right peroneal vein --Had been continued on heparin gtt. Per nephrology, can consider transition to coumadin vs eliquis -Discussed with  Pharmacy. Given concerns of compliance, will transition to eliquis  Essential hypertension Patient on triamterene-HCTZ 75-50 mg p.o. daily at home. --Holding for acute kidney failure as above --Hydralazine 25mg  PO q8h prn for SBP >170  Peripheral arterial disease Follows with cardiology, Dr. Gwenlyn Found outpatient.  Recent arterial vascular ultrasound 03/25/2019 with occluded left SFA stent, bilateral occlusion peroneal artery.  Dr. [place] discussed case with Dr. Gwenlyn Found, without critical limb ischemia or ulcerations, no indication for acute intervention at this time recommend continued outpatient follow-up.  Patient has follow-up scheduled in cardiology clinic with Dr. Gwenlyn Found. --Continue aspirin 81 mg p.o. daily  Type 2 diabetes mellitus --Hemoglobin A1c 5.4, well controlled.  On Metformin 1000 mg twice daily, and glimepiride 1 mg p.o. twice daily at home. --holdig oral hypoglycemics in the setting of acute renal failure as above --Continue insulin sliding scale for further coverage  GERD: Continue PPI  Hypothyroidism: Continue levothyroxine 50 mcg p.o. daily as tolerated  Iron deficiency anemia/anemia of chronic disease:  --Hgb 7.6-->7.0-->6.7-->6.3.  Anemia work-up with iron 220, TIBC 241, ferritin 417, B12 193, consistent with anemia of chronic disease likely secondary to renal failure. --Pt had continued to decline blood transfusions; Dr. [place] discussed with patient once again extensively that with continued trending down of hemoglobin she is at risk for adverse events such as cardiac ischemia/MI, cardiopulmonary arrest; she continues to wish not to receive any blood products.  Blood products refusal form signed and placed in chart. --Palliative care consultation for assistance with goals of care/medical decision making pending  HLD: Continue atorvastatin 10 mg p.o. daily  Peripheral neuropathy: Continue gabapentin 300 mg p.o. twice daily  DVT prophylaxis: Eliquis Code Status:  DNR Family Communication: Pt in room, family not at bedside Disposition Plan: From home, plan d/c home if  potassium stabilizes in AM, currently higher at 6  Consultants:   Nephrology  Procedures:     Antimicrobials: Anti-infectives (From admission, onward)   None       Subjective: Eager to go home. Denies sob  Objective: Vitals:   04/02/19 2002 04/03/19 0548 04/03/19 0735 04/03/19 1400  BP: (!) 165/60 135/67 133/60 130/68  Pulse: 85 (!) 104 78 80  Resp: 15 16 17 17   Temp: 97.9 F (36.6 C) 98.5 F (36.9 C) 98.8 F (37.1 C) 98.2 F (36.8 C)  TempSrc: Oral Oral Oral Oral  SpO2: 100% 100% 100% 100%  Weight:      Height:        Intake/Output Summary (Last 24 hours) at 04/03/2019 1531 Last data filed at 04/02/2019 1600 Gross per 24 hour  Intake 162.08 ml  Output --  Net 162.08 ml   Filed Weights   03/29/19 1937 03/31/19 0500 04/01/19 0500  Weight: 72.6 kg 68.7 kg 70.1 kg    Examination:  General exam: Appears calm and comfortable  Respiratory system: Clear to auscultation. Respiratory effort normal. Cardiovascular system: S1 & S2 heard, Regular Gastrointestinal system: Abdomen is nondistended, soft and nontender. No organomegaly or masses felt. Normal bowel sounds heard. Central nervous system: Alert and oriented. No focal neurological deficits. Extremities: Symmetric 5 x 5 power. Skin: No rashes, lesions or ulcers Psychiatry: Judgement and insight appear normal. Mood & affect appropriate.   Data Reviewed: I have personally reviewed following labs and imaging studies  CBC: Recent Labs  Lab 03/30/19 0346 03/30/19 0346 03/31/19 0338 03/31/19 0836 04/01/19 0455 04/02/19 0323 04/03/19 0419  WBC 3.8*  --  3.1*  --  2.8* 2.9* 3.0*  HGB 7.6*  --  7.0*  --  6.7* 6.3* 6.6*  HCT 23.6*   < > 22.8* 22.6* 21.4* 20.8* 21.2*  MCV 98.3  --  98.7  --  98.6 100.5* 101.0*  PLT 324  --  313  --  305 275 295   < > = values in this interval not displayed.   Basic  Metabolic Panel: Recent Labs  Lab 03/31/19 0338 04/01/19 0455 04/02/19 0323 04/02/19 1849 04/03/19 0419  NA 136 139 138 135 136  K 4.7 5.2* 6.0* 5.5* 6.1*  CL 110 113* 115* 110 111  CO2 19* 20* 19* 19* 18*  GLUCOSE 102* 95 98 134* 112*  BUN 52* 46* 46* 41* 37*  CREATININE 3.72* 3.53* 3.51* 3.29* 3.28*  CALCIUM 9.0 9.1 8.9 9.0 9.1  PHOS  --   --   --  3.5 3.9   GFR: Estimated Creatinine Clearance: 15 mL/min (A) (by C-G formula based on SCr of 3.28 mg/dL (H)). Liver Function Tests: Recent Labs  Lab 04/02/19 1849 04/03/19 0419  ALBUMIN 2.8* 2.7*   No results for input(s): LIPASE, AMYLASE in the last 168 hours. No results for input(s): AMMONIA in the last 168 hours. Coagulation Profile: No results for input(s): INR, PROTIME in the last 168 hours. Cardiac Enzymes: No results for input(s): CKTOTAL, CKMB, CKMBINDEX, TROPONINI in the last 168 hours. BNP (last 3 results) No results for input(s): PROBNP in the last 8760 hours. HbA1C: No results for input(s): HGBA1C in the last 72 hours. CBG: Recent Labs  Lab 04/02/19 1146 04/02/19 1652 04/02/19 2055 04/03/19 0656 04/03/19 1119  GLUCAP 75 143* 131* 107* 125*   Lipid Profile: No results for input(s): CHOL, HDL, LDLCALC, TRIG, CHOLHDL, LDLDIRECT in the last 72 hours. Thyroid Function Tests: No results for input(s): TSH, T4TOTAL,  FREET4, T3FREE, THYROIDAB in the last 72 hours. Anemia Panel: No results for input(s): VITAMINB12, FOLATE, FERRITIN, TIBC, IRON, RETICCTPCT in the last 72 hours. Sepsis Labs: No results for input(s): PROCALCITON, LATICACIDVEN in the last 168 hours.  Recent Results (from the past 240 hour(s))  SARS CORONAVIRUS 2 (TAT 6-24 HRS) Nasopharyngeal Nasopharyngeal Swab     Status: None   Collection Time: 03/29/19 10:38 PM   Specimen: Nasopharyngeal Swab  Result Value Ref Range Status   SARS Coronavirus 2 NEGATIVE NEGATIVE Final    Comment: (NOTE) SARS-CoV-2 target nucleic acids are NOT DETECTED. The  SARS-CoV-2 RNA is generally detectable in upper and lower respiratory specimens during the acute phase of infection. Negative results do not preclude SARS-CoV-2 infection, do not rule out co-infections with other pathogens, and should not be used as the sole basis for treatment or other patient management decisions. Negative results must be combined with clinical observations, patient history, and epidemiological information. The expected result is Negative. Fact Sheet for Patients: SugarRoll.be Fact Sheet for Healthcare Providers: https://www.woods-mathews.com/ This test is not yet approved or cleared by the [place] FDA and  has been authorized for detection and/or diagnosis of SARS-CoV-2 by FDA under an Emergency Use Authorization (EUA). This EUA will remain  in effect (meaning this test can be used) for the duration of the COVID-19 declaration under Section 56 4(b)(1) of the Act, 21 U.S.C. section 360bbb-3(b)(1), unless the authorization is terminated or revoked sooner. Performed at [facility], [place] [address] [place],  [address]      Radiology Studies: No results found.  Scheduled Meds: . apixaban  5 mg Oral BID  . aspirin  81 mg Oral Daily  . atorvastatin  10 mg Oral QHS  . Chlorhexidine Gluconate Cloth  6 each Topical Daily  . darbepoetin (ARANESP) injection - NON-DIALYSIS  60 mcg Subcutaneous Q Wed-1800  . gabapentin  300 mg Oral BID  . insulin aspart  0-5 Units Subcutaneous QHS  . insulin aspart  0-9 Units Subcutaneous TID WC  . iron polysaccharides  150 mg Oral BID  . levothyroxine  50 mcg Oral Q0600  . pantoprazole  40 mg Oral QAC breakfast  . sodium chloride flush  10-40 mL Intracatheter Q12H  . sodium zirconium cyclosilicate  10 g Oral BID   Continuous Infusions:   LOS: 5 days   Marylu Lund, MD [facility] Pager On Amion  If 7PM-7AM, please contact night-coverage 04/03/2019, 3:31 PM

## 2019-04-03 NOTE — TOC Benefit Eligibility Note (Signed)
Transition of Care [facility]) Benefit Eligibility Note    Patient Details  Name: NACOLE FLUHR MRN: [id number] Date of Birth: [birth year]   Medication/Dose: ELIQUIS  5 MG BID  Covered?: Yes  Tier: (NO TIER)  Prescription Coverage [facility]  Spoke with Person/Company/Phone Number:: [place]  @ [place] # [phone]  Co-Pay: ZERO DOLLARS     Deductible: (NO DEDUCTIBLE)       Memory Argue Phone Number: 04/03/2019, 5:28 PM

## 2019-04-03 NOTE — Plan of Care (Signed)
  Problem: Education: Goal: Knowledge of General Education information will improve Description: Including pain rating scale, medication(s)/side effects and non-pharmacologic comfort measures Outcome: Progressing   Problem: Activity: Goal: Risk for activity intolerance will decrease Outcome: Progressing   Problem: Nutrition: Goal: Adequate nutrition will be maintained Outcome: Progressing   

## 2019-04-03 NOTE — Progress Notes (Signed)
Benefits check in process for Eliquis 5 mg twice a day. NCM to provide pt with 30 day free Eliquis card prior to d/c.  Whitman Hero RN,BSN,CM

## 2019-04-04 LAB — BASIC METABOLIC PANEL
Anion gap: 6 (ref 5–15)
BUN: 38 mg/dL — ABNORMAL HIGH (ref 8–23)
CO2: 18 mmol/L — ABNORMAL LOW (ref 22–32)
Calcium: 9 mg/dL (ref 8.9–10.3)
Chloride: 112 mmol/L — ABNORMAL HIGH (ref 98–111)
Creatinine, Ser: 3.31 mg/dL — ABNORMAL HIGH (ref 0.44–1.00)
GFR calc Af Amer: 15 mL/min — ABNORMAL LOW (ref >60)
GFR calc non Af Amer: 13 mL/min — ABNORMAL LOW (ref >60)
Glucose, Bld: 98 mg/dL (ref 70–99)
Potassium: 5.5 mmol/L — ABNORMAL HIGH (ref 3.5–5.1)
Sodium: 136 mmol/L (ref 135–145)

## 2019-04-04 LAB — GLUCOSE, CAPILLARY
Glucose-Capillary: 117 mg/dL — ABNORMAL HIGH (ref 70–99)
Glucose-Capillary: 160 mg/dL — ABNORMAL HIGH (ref 70–99)
Glucose-Capillary: 163 mg/dL — ABNORMAL HIGH (ref 70–99)

## 2019-04-04 MED ORDER — APIXABAN 5 MG PO TABS: 5.0000 mg | ORAL_TABLET | Freq: Two times a day (BID) | ORAL | 0 refills | Status: DC

## 2019-04-04 MED ORDER — SITAGLIPTIN PHOSPHATE 25 MG PO TABS: 25.0000 mg | ORAL_TABLET | Freq: Every day | ORAL | 0 refills | Status: DC

## 2019-04-04 MED ORDER — TRAMADOL HCL 50 MG PO TABS: 50.0000 mg | ORAL_TABLET | Freq: Two times a day (BID) | ORAL | 0 refills | Status: DC | PRN

## 2019-04-04 MED ORDER — SODIUM ZIRCONIUM CYCLOSILICATE 10 G PO PACK: 10.0000 g | PACK | Freq: Two times a day (BID) | ORAL | 0 refills | Status: AC

## 2019-04-04 MED ORDER — HEPARIN SOD (PORK) LOCK FLUSH 100 UNIT/ML IV SOLN
500.0000 [IU] | INTRAVENOUS | Status: AC | PRN
  Administered 2019-04-04: 500 [IU]
  Filled 2019-04-04: qty 5

## 2019-04-04 MED FILL — LOKELMA 10 GM PACK: 10 | 30 days supply | Qty: 60 | Fill #0

## 2019-04-04 MED FILL — traMADol HCL 50 MG TABS: 50 | 5 days supply | Qty: 10 | Fill #0

## 2019-04-04 MED FILL — ELIQUIS 5 MG TABLET: 5 | 30 days supply | Qty: 60 | Fill #0

## 2019-04-04 MED FILL — JANUVIA 25 MG TABLET: 25 | 30 days supply | Qty: 30 | Fill #0

## 2019-04-04 NOTE — TOC Transition Note (Signed)
Transition of Care [facility]) - CM/SW Discharge Note   Patient Details  Name: Nicole Sellers MRN: [id number] Date of Birth: 01/30/1950  Transition of Care [facility]) CM/SW Contact:  Sharin Mons, RN Phone Number: [phone] 04/04/2019, 3:48 PM   Clinical Narrative:    Pt will transition to home today. Pt states son to provide transportation to home. NCM provided pt with Eliquis 30 day free card.  No DME needs noted. Pt states no issues affording medication. RNCM will sign off for now as intervention is no longer needed. Please consult [place] again if new needs arise.   Final next level of care: Home/Self Care Barriers to Discharge: No Barriers Identified   Patient Goals and CMS Choice        Discharge Placement                       Discharge Plan and Services   Discharge Planning Services: CM Consult                                 Social Determinants of Health (SDOH) Interventions     Readmission Risk Interventions Readmission Risk Prevention Plan 10/02/2018 08/24/2018  Transportation Screening Complete Complete  Medication Review Press photographer) Complete Complete  PCP or Specialist appointment within 3-5 days of discharge Not Complete Not Complete  PCP/Specialist Appt Not Complete comments not ready to dc Not ready for dc  HRI or [place] Complete Complete  SW Recovery Care/Counseling Consult Complete Not Complete  SW Consult Not Complete Comments - NA  Palliative Care Screening Not Applicable Not [place] Not Applicable Not Applicable  Some recent data might be hidden

## 2019-04-04 NOTE — Plan of Care (Signed)

## 2019-04-04 NOTE — Progress Notes (Signed)
[facility] Progress Note  Subjective: creat stable at 3.5 today. K improved to 5.5. Feels overall well.  Looking forward to discharge.  We chatted a bit more yesterday -- she lives in apt [address], independent - takes taxi to store, takes med transport or taxi to appts.  Doesn't have anyone helping her with I/ADLs.  Vitals:   04/03/19 1400 04/03/19 2058 04/04/19 0429 04/04/19 0759  BP: 130/68 (!) 117/52 (!) 133/56 (!) 134/52  Pulse: 80 84 84 71  Resp: '17 15 14 17  '$ Temp: 98.2 F (36.8 C) 98.5 F (36.9 C) 98.5 F (36.9 C) 98.3 F (36.8 C)  TempSrc: Oral Oral Oral Oral  SpO2: 100% 100% 100% 99%  Weight:      Height:        Exam: Gen no distress, calm, elderly AAF  No jvd Chest clear bilat no rales RRR no MRG  Abd soft ntnd no mass or ascites +bs Ext bilat L > R lower leg edema 1+ - some improvement, some venous stasis appearing ulcers on L tibia, no erythema or drainage - no new ones and the existing ones look a bit better.  Neuro is alert, Ox 3 , nf    Home meds:  - metformin 1gm bid/ glimepiride '1mg'$  bid  - norvasc 10/ triamterene-hctz 75-50 qd  - neurontin 300 bid  - prilosec 40 qd/ synthroid 50ug qd  - aspirin 81/ lipitor 10  - prn's/ vitamins/ supplements    Date                           Creat               eGFR   2011- 07/2018             0.8- 1.1   08/2018                       1.5 >> 0.9        AKI mild   09/2018                       4.21 >> 1.05    AKI severe, 12 > 60   11/2018                     2.14                 26 ml/min   Today                        4.60                 10 ml/ min               July 2020 - rectal CA pt admitted for Robotic LAR , diverting ostomy and hernia repair   Aug 2020 - admit for dehydration and FTT, found to have postop SBO, NG placed and TPN given, she improved and GI tract functioned properly. [place] home.    Sept 2020 - admit for dehydration/ AKI, esophagitis/ dysphagia, creat improved 4.2 > 1.0 at dc;  esophagitis rx w/ IV then po PPI and empiric diflucan w/ GI f/u. Hx of CVA w/ R side weakness, chornic    UA 09/2018    20-50 wbc, 6-10 rbc, rare bact, neg protein      09/2018 -  UNa <10, UCr 368   UA 03/30/19  6-10 wbc, 0-5 rbc, rare bact, neg protein      03/30/19 - UNa 12, UCr 102    CT abd noncon 3/20 > 1) Adrenals/Urinary Tract: bilateral nonobstructive nephrolithiasis is noted. No hydronephrosis or renal obstruction is noted. Urinary bladder is unremarkable. 2) ileostomy RUQ  3) no acute abd /pelvis     Renal US - 9.0/ 9.1 cm kidneys w/o hydro, normal echo     CXR - no active disease    Assessment/ Plan: 1. Renal failure - baseline creat from sept- nov 2020 is 2.0.  Pt has had AKI episodes from dehydration/ high output ostomy in 2020 after her cancer surgery. UA and renal US unremarkable but noted low urine sodium and FeNa 0.4% ? Prerenal and was treated with NS bolus 1L on presentation vs ATN.  Pt is voiding but not being recorded. Creat down 5.0 on admit > 4.6 > 3.7 > 3.5 > 3.5 > 3.5 > 3.5 today. Swelling LE's stable c/w yest but overall improved per patient.  Has bilat LE edema but hesitant to diurese for this given DVT on one side and improving creatinine. Cont to observe. Labs in am.   Hold diuretic at discharge. 2. RLE DVT - multiple DVT's RLE, on IV hep per pmd and starting eliquis.  3. Rectal CA - sp chemo 2019-20 and LAR/ ileostomy in mid 2020.  In remission per last evaluation. 4. HTN -  BP's variable but generally ok.  5. DM2 - on po meds at home.  Per primay. 6. Smoker 7. PAD - of LE's, has occluded L SFA stent 8. Anemia - Hb in 6s, symptomatic but adamant about no blood transfusion. Iron, B12, folate ok.  Discussed ESA with her and she wishes to proceed.  Will order and arrange outpt ESA. 9. Hyperkalemia - Improved with lokelma and renal diet and d/w her.   Having CM run lokelma 10g daily with her insurance.  If unacceptable will check veltassa 8.4g daily.    From renal  perspective I think she's ok to discharge if she can obtain lokelma (see #9 above).  Her renal function has been stable --> It may improve if this ATN but may not. She can f/u with me in clinic which I will arrange for in the next few weeks; she has my card as well.  Call with questions.  Will follow peripherally for now.    Jannifer Hick MD [facility] Pager [phone]   Recent Labs  Lab 04/02/19 [id number] 04/02/19 0323 04/02/19 1849 04/02/19 1849 04/03/19 0419 04/04/19 0402  K 6.0*   < > 5.5*   < > 6.1* 5.5*  BUN 46*   < > 41*   < > 37* 38*  CREATININE 3.51*   < > 3.29*   < > 3.28* 3.31*  CALCIUM 8.9   < > 9.0   < > 9.1 9.0  PHOS  --   --  3.5  --  3.9  --   HGB 6.3*  --   --   --  6.6*  --    < > = values in this interval not displayed.   Inpatient medications: . apixaban  5 mg Oral BID  . aspirin  81 mg Oral Daily  . atorvastatin  10 mg Oral QHS  . Chlorhexidine Gluconate Cloth  6 each Topical Daily  . darbepoetin (ARANESP) injection - NON-DIALYSIS  60 mcg Subcutaneous Q Wed-1800  . gabapentin  300 mg Oral BID  . insulin aspart  0-5 Units Subcutaneous QHS  . insulin aspart  0-9 Units Subcutaneous TID WC  . iron polysaccharides  150 mg Oral BID  . levothyroxine  50 mcg Oral Q0600  . pantoprazole  40 mg Oral QAC breakfast  . sodium chloride flush  10-40 mL Intracatheter Q12H  . sodium zirconium cyclosilicate  10 g Oral BID    acetaminophen **OR** acetaminophen, hydrALAZINE, ondansetron **OR** ondansetron (ZOFRAN) IV, sodium chloride flush, traMADol

## 2019-04-04 NOTE — Progress Notes (Signed)
A discharge instruction packet printed and reviewed with the patient.  The patient will be discharged to home with her son.

## 2019-04-04 NOTE — Progress Notes (Signed)
PALLIATIVE NOTE:  Patient sitting on the side of the bed looking at TV. States she did not sleep as well last night due to frequent checks by nursing staff. Plans to take a nap today. Denies shortness of breath. States her legs are aching. She has some medicated ointment at the bedside that she is rubbing on them.   Goals are set. Patient confirms plans to return home with outpatient palliative support. Again reminds me that she does not want to leave without her completed MOST and DNR which is on her chart. She is aware her RN will provide once she is ready for discharge. States she is planning on reading through the AD packet today and completing. She verbalizes wishes to complete prior to discharge. Offered to review with her for support, however she declines at this time. Shares Chaplain came to visit with her yesterday and plans to come back for completion.   Plan -Continue current plan of care per medical team -D/C home with outpatient palliative support -Chaplain to assist with completion of AD. Patient would like this completed prior to discharging.  -PMT will continue to support and follow as needed.   Time Total: 60min.   Visit consisted of counseling and education dealing with the complex and emotionally intense issues of symptom management and palliative care in the setting of serious and potentially life-threatening illness.Greater than 50%  of this time was spent counseling and coordinating care related to the above assessment and plan.  Alda Lea, AGPCNP-BC  Palliative Medicine Team [phone]

## 2019-04-04 NOTE — TOC Benefit Eligibility Note (Signed)
Transition of Care [facility]) Benefit Eligibility Note    Patient Details  Name: TAMIAH DYSART MRN: [id number] Date of Birth: [birth year]   Medication/Dose: Milly Jakob  10 G DAILY  Covered?: Yes  Tier: (TIER- 4 DRUG)  Prescription Coverage [facility] , [place] with Person/Company/Phone Number:: PATRICIA  @ [place] [facility] # [phone]  Co-Pay: ZERO DOLLARS     Deductible: Unmet(OUT-OF-POCKET: NOT MET and  Pt  has L.I.S LEVEL ONE)  Additional Notes: ALL BRAND  MEDICINE  ARE  $9.20  and   GENERICE  MEDICINE ARE  $3.70    Memory Argue Phone Number: 04/04/2019, 1:25 PM

## 2019-04-04 NOTE — Discharge Summary (Signed)
Physician Discharge Summary  Nicole Sellers RSW:[id number] DOB: 14-Jul-1950 DOA: 03/29/2019  PCP: Reynold Bowen, MD  Admit date: 03/29/2019 Discharge date: 04/04/2019  Admitted From: Home Disposition:  Home  Recommendations for Outpatient Follow-up:  1. Follow up with PCP in 1-2 weeks 2. Follow up with Nephrology as scheduled 3. Outpatient palliative support  Home Health:RN, SW   Discharge Condition:Stable CODE STATUS:DNR Diet recommendation: Diabetic   Brief/Interim Summary: 69 year old African-American female with past medical history remarkable for stage III rectal cancer diagnosed 2019 status post chemotherapy/resection, PAD, T2DM, peripheral neuropathy, DVT Dx 11/2018 untreated who presented to the ED at the request of her PCP for abnormal labs with an elevated creatinine level.  Patient diagnosed with DVT in November 2020 has apparently not been treated, has been lost to follow-up in regards to general surgery and oncology.  Also patient follows with cardiology, Dr. Gwenlyn Found in regards to her peripheral artery disease.  Recent ultrasound arterial Dopplers notable for occluded left SFA stent, in which patient has outpatient appointment scheduled on 04/01/2019 with Dr. Gwenlyn Found.  In the ED, creatinine was noted to be 5.04; which is up from 2.1 in November 2020 and 1.0 in September 2020.  Hemoglobin 7.8.  Renal ultrasound suboptimal view of left kidney but otherwise unremarkable with no obstruction/masses appreciated.  Given her acute renal failure, hospital service was consulted for admission and further evaluation and work-up.  Discharge Diagnoses:  Principal Problem:   AKI (acute kidney injury) ([place]) Active Problems:   Essential hypertension   Non-insulin treated type 2 diabetes mellitus ([place])   PAD (peripheral artery disease) Rt ABI 0.7, Lt ABI 0.46   Rectal cancer ([facility])   Acute kidney injury ([place])   DVT (deep venous thrombosis) ([facility])  Acute renal failure Creatinine elevated  to 5.01 on admission, up from 2.14 11/2018 and 1.05 09/2018. Renal ultrasound with suboptimal views of left kidney but otherwise unremarkable without findings of obstruction or mass. Is unremarkable. FeNa = 0.4% consistent with prerenal azotemia, although on diuretic therapy with Maxide which could alter result. FeUrea 36.5%, which is consistent with intrinsic renal disease. CT abdomen/pelvis negative for hydronephrosis or urinary obstruction. --Nephrologyfollowing. Per Nephrology, OK to d/c today with lokelma on d/c --Holding home Maxide, Metformin, glimepiride --Avoid nephrotoxins, renally dose all medications --Renal function has since remained stable. Pt to follow up with Nephrology as outpatient  DVT --Patient originally diagnosed with DVT right popliteal vein, right peroneal veins with age-indeterminate DVT right tibioperoneal trunk in November 2020. Apparently has not been treated with anticoagulation. Continues with bilateral lower extremity edema. Venous duplex ultrasound bilateral lower extremities positive for DVT right popliteal vein, right posterior tibial veins, right peroneal vein --Had been continued on heparin gtt. Have continued on eliquis  Essential hypertension Patient on triamterene-HCTZ 75-50 mg p.o. daily at home. --Holding for acute kidney failure as above --Was continued on PRN Hydralazine 25mg  PO q8h prn for SBP >170 --BP has otherwise remained stable  Peripheral arterial disease Follows with cardiology, Dr. Gwenlyn Found outpatient. Recent arterial vascular ultrasound 03/25/2019 with occluded left SFA stent, bilateral occlusion peroneal artery. Dr. [place] discussed case with Dr. Gwenlyn Found, without critical limb ischemia or ulcerations, no indication for acute intervention at this time recommend continued outpatient follow-up. Patient has follow-up scheduled in cardiology clinic with Dr. Gwenlyn Found. --Continue aspirin 81 mg p.o. daily  Type 2 diabetes mellitus --Hemoglobin  A1c 5.4, well controlled. On Metformin 1000 mg twice daily, and glimepiride 1 mg p.o. twice daily at home. --holdig oral hypoglycemics in the  setting of acute renal failure as above --Continue insulin sliding scale for further coverage  GERD: Continue PPI  Hypothyroidism:Continue levothyroxine 50 mcg p.o. daily as tolerated  Iron deficiency anemia/anemia of chronic disease: --Anemia work-up with iron 220, TIBC 241, ferritin 417, B12 193, consistent with anemia of chronic disease likely secondary to renal failure. --Pt had continued to decline blood transfusions;Dr. [place] discussed with patient once again extensively that with continued trending down of hemoglobin she is at risk for adverse events such as cardiac ischemia/MI, cardiopulmonary arrest; she continues to wish not to receive any blood products. Blood products refusal form signed and placed in chart. --Palliativecare consultation for assistance with goals of care/medical decision makingpending  KYH:CWCBJSEG atorvastatin 10 mg p.o. daily  Peripheral neuropathy:Continue gabapentin 300 mg p.o. twice daily. Have been continued on PRN ultram for analgesia. NCCSR reviewed, no recent narcotics listed   Discharge Instructions   Allergies as of 04/04/2019      Reactions   Kiwi Extract Itching, Other (See Comments)   Throat itching    Lisinopril Other (See Comments)   Bruising   Plavix [clopidogrel] Other (See Comments)   "Made me feel badly- didn't agree with my system"      Medication List    STOP taking these medications   amLODipine 10 MG tablet Commonly known as: NORVASC   glimepiride 1 MG tablet Commonly known as: AMARYL   metFORMIN 500 MG 24 hr tablet Commonly known as: GLUCOPHAGE-XR   Osteo Bi-Flex Adv Triple St Tabs   triamterene-hydrochlorothiazide 75-50 MG tablet Commonly known as: MAXZIDE     TAKE these medications   acetaminophen 500 MG tablet Commonly known as: TYLENOL Take 500 mg by  mouth 2 (two) times daily as needed for mild pain, moderate pain or headache.   albuterol 108 (90 Base) MCG/ACT inhaler Commonly known as: VENTOLIN HFA Inhale 2 puffs into the lungs every 6 (six) hours as needed for wheezing or shortness of breath.   apixaban 5 MG Tabs tablet Commonly known as: ELIQUIS Take 1 tablet (5 mg total) by mouth 2 (two) times daily.   ASPERCREME EX Apply 1 application topically daily as needed (to affected areas, for joint pain).   aspirin 81 MG tablet Take 81 mg by mouth daily.   atorvastatin 10 MG tablet Commonly known as: LIPITOR Take 1 tablet (10 mg total) by mouth daily. What changed: when to take this   CALCIUM 600+D PO Take 1 tablet by mouth daily.   cetirizine 10 MG tablet Commonly known as: ZYRTEC Take 10 mg by mouth daily.   Ferrex 150 150 MG capsule Generic drug: iron polysaccharides Take 150 mg by mouth 2 (two) times daily.   gabapentin 300 MG capsule Commonly known as: NEURONTIN Take 300 mg by mouth 2 (two) times daily.   levothyroxine 50 MCG tablet Commonly known as: SYNTHROID Take 1 tablet (50 mcg total) by mouth daily.   METAMUCIL FIBER PO Take 1 Scoop by mouth See admin instructions. Mix 1 scoopful into 6-8 ounces of desired beverage and drink two times a day   NASACORT ALLERGY 24HR NA Place 1 spray into both nostrils daily as needed (allergies).   omeprazole 20 MG tablet Commonly known as: PRILOSEC OTC Take 20 mg by mouth daily before breakfast.   SENIOR MULTIVITAMIN PLUS PO Take 1 tablet by mouth daily with breakfast.   sitaGLIPtin 25 MG tablet Commonly known as: Januvia Take 1 tablet (25 mg total) by mouth daily.   sodium zirconium cyclosilicate 10  g Pack packet Commonly known as: LOKELMA Take 10 g by mouth 2 (two) times daily.   tetrahydrozoline-zinc 0.05-0.25 % ophthalmic solution Commonly known as: VISINE-AC Place 1 drop into both eyes 2 (two) times daily as needed (dry eyes).   traMADol 50 MG  tablet Commonly known as: ULTRAM Take 1 tablet (50 mg total) by mouth every 12 (twelve) hours as needed for moderate pain.   vitamin B-12 500 MCG tablet Commonly known as: CYANOCOBALAMIN Take 500 mcg by mouth daily.   Vitamin D3 50 MCG (2000 UT) Tabs Take 4,000 Units by mouth daily.      Follow-up Information    Reynold Bowen, MD. Schedule an appointment as soon as possible for a visit in 2 week(s).   Specialty: Endocrinology Contact information: [place] [address] [phone]        Lorretta Harp, MD .   Specialties: Cardiology, Radiology Contact information: [address] [place] [address] [phone]        Justin Mend, MD Follow up.   Specialty: Internal Medicine Why: Follow up as scheduled Contact information: [address] [place] [address] [phone]          Allergies  Allergen Reactions  . Kiwi Extract Itching and Other (See Comments)    Throat itching   . Lisinopril Other (See Comments)    Bruising   . Plavix [Clopidogrel] Other (See Comments)    "Made me feel badly- didn't agree with my system"    Consultations:  Nephrology  Palliative Care  Procedures/Studies: CT ABDOMEN PELVIS WO CONTRAST  Result Date: 03/30/2019 CLINICAL DATA:  Acute kidney injury. History of colorectal cancer. EXAM: CT ABDOMEN AND PELVIS WITHOUT CONTRAST TECHNIQUE: Multidetector CT imaging of the abdomen and pelvis was performed following the standard protocol without IV contrast due to acute kidney injury. COMPARISON:  January 17, 2018. FINDINGS: Lower chest: Stable small subcentimeter nodules are noted. No acute abnormality seen in visualized lung bases. Hepatobiliary: No focal liver abnormality is seen. No gallstones, gallbladder wall thickening, or biliary dilatation. Pancreas: Unremarkable. No pancreatic ductal dilatation or surrounding inflammatory changes. Spleen: Normal in size without focal abnormality.  Adrenals/Urinary Tract: Adrenal glands appear normal. Bilateral nonobstructive nephrolithiasis is noted. No hydronephrosis or renal obstruction is noted. Urinary bladder is unremarkable. Stomach/Bowel: Stomach is unremarkable. 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 There is no evidence of bowel obstruction or inflammation. The appendix appears normal. Sigmoid diverticulosis is noted without inflammation. Vascular/Lymphatic: Aortic atherosclerosis. No enlarged abdominal or pelvic lymph nodes. Reproductive: Status post hysterectomy. No adnexal masses. Other: No abdominal wall hernia or abnormality. No abdominopelvic ascites. Musculoskeletal: No acute or significant osseous findings. IMPRESSION: 1. Bilateral nonobstructive nephrolithiasis. No hydronephrosis or renal obstruction is noted. 2. 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 3. Sigmoid diverticulosis is noted without inflammation. 4. No acute abnormality seen in the abdomen or pelvis. Aortic Atherosclerosis (ICD10-I70.0). Electronically Signed   By: Marijo Conception M.D.   On: 03/30/2019 13:35   US Renal  Result Date: 03/29/2019 CLINICAL DATA:  Acute kidney injury, elevated BUN and creatinine EXAM: RENAL / URINARY TRACT ULTRASOUND COMPLETE COMPARISON:  Prior ultrasound 10/02/2018, CT abdomen pelvis 01/17/2018 FINDINGS: Right Kidney: Renal measurements: 9.1 x 4.4 x 6.7 cm = volume: 131.9 mL . Echogenicity within normal limits. No mass, shadowing calculus or hydronephrosis. Visualized. Left Kidney: Renal measurements: 9.0 x 4.6 x 5.2 cm = volume: 11.2 mL. Suboptimal visualization secondary to bowel gas. Echogenicity within normal limits.  No visible mass, shadowing calculus or hydronephrosis within the visible portions. Bladder: Appears normal for degree of bladder distention. Other: None. IMPRESSION: Suboptimal visualization of the left kidney due to overlying bowel gas. Grossly unremarkable renal ultrasound within the limitations of the exam.  Electronically Signed   By: Lovena Le M.D.   On: 03/29/2019 23:27   DG CHEST PORT 1 VIEW  Result Date: 03/30/2019 CLINICAL DATA:  Acute kidney injury EXAM: PORTABLE CHEST 1 VIEW COMPARISON:  11/22/2017 FINDINGS: Right Port-A-Cath remains in place, unchanged. Heart is upper limits normal in size. No confluent opacities or effusions. No acute bony abnormality. IMPRESSION: No active disease. Electronically Signed   By: Rolm Baptise M.D.   On: 03/30/2019 22:49   VAS [place] ABI WITH/WO TBI  Result Date: 03/26/2019 LOWER EXTREMITY DOPPLER STUDY Indications: Peripheral artery disease, s/p right EIA stenting and right SFA PTA              and stenting. Patient reports that her legs feel "numb" all the              time, and she continues to have bilateral leg pain with walking. High Risk Factors: Hypertension, Diabetes, past history of smoking.  Vascular Interventions: S/P PTA/stenting of the right external iliac artery                         05/08/17 and history of right PTA/stenting of the entire                         right SFA in 12/17. Comparison Study: Previous ABIs in 11/19 were 0.92 on the right and 0.63 on the                   left. Performing Technologist: Mariane Masters RVT  Examination Guidelines: A complete evaluation includes at minimum, Doppler waveform signals and systolic blood pressure reading at the level of bilateral brachial, anterior tibial, and posterior tibial arteries, when vessel segments are accessible. Bilateral testing is considered an integral part of a complete examination. Photoelectric Plethysmograph (PPG) waveforms and toe systolic pressure readings are included as required and additional duplex testing as needed. Limited examinations for reoccurring indications may be performed as noted.  ABI Findings: +---------+------------------+-----+-------------------+--------+ Right    Rt Pressure (mmHg)IndexWaveform           Comment   +---------+------------------+-----+-------------------+--------+ Brachial 163                                                +---------+------------------+-----+-------------------+--------+ ATA      71                0.44 dampened monophasic         +---------+------------------+-----+-------------------+--------+ PTA      81                0.50 dampened monophasic         +---------+------------------+-----+-------------------+--------+ PERO                            absent                      +---------+------------------+-----+-------------------+--------+ Bard Herbert  0.25 Abnormal                    +---------+------------------+-----+-------------------+--------+ +---------+----------------+-----+------------------+--------------------------+ Left     Lt Pressure     IndexWaveform          Comment                             (mmHg)                                                            +---------+----------------+-----+------------------+--------------------------+ Brachial 108                                    55mg  gradient from right                                                   arm                        +---------+----------------+-----+------------------+--------------------------+ ATA      80              0.49 dampened                                                                   monophasic                                   +---------+----------------+-----+------------------+--------------------------+ PTA      78              0.48 dampened                                                                   monophasic                                   +---------+----------------+-----+------------------+--------------------------+ PERO     71              0.44 dampened                                                                   monophasic                                    +---------+----------------+-----+------------------+--------------------------+  Great Toe0               0.00 Absent                                       +---------+----------------+-----+------------------+--------------------------+ +-------+-----------+-----------+------------+------------+ ABI/TBIToday's ABIToday's TBIPrevious ABIPrevious TBI +-------+-----------+-----------+------------+------------+ Right  0.50       0.25       0.92        0.63         +-------+-----------+-----------+------------+------------+ Left   0.49       0          0.63        0.38         +-------+-----------+-----------+------------+------------+ Bilateral ABIs and TBIs appear decreased compared to prior study on 115/19.  Summary: Right: Resting right ankle-brachial index indicates moderate to severe right lower extremity arterial disease. The right toe-brachial index is abnormal. Left: Resting left ankle-brachial index indicates severe left lower extremity arterial disease. The left toe-brachial index is abnormal.  *See table(s) above for measurements and observations. See aorta-iliac and arterial duplex reports.  Vascular consult recommended. Electronically signed by Quay Burow MD on 03/26/2019 at 7:31:13 AM.    Final    ECHOCARDIOGRAM COMPLETE  Result Date: 03/31/2019    ECHOCARDIOGRAM REPORT   Patient Name:   Nicole Sellers Date of Exam: 03/31/2019 Medical Rec #:  [id number]        Height:       66.5 in Accession #:    [id number]       Weight:       151.5 lb Date of Birth:  [birth year]        BSA:          1.787 m Patient Age:    39 years         BP:           153/84 mmHg Patient Gender: F                HR:           70 bpm. Exam Location:  Inpatient Procedure: 2D Echo, Cardiac Doppler and Color Doppler Indications:    Dyspnea 786.09/R06.00  History:        Patient has no prior history of Echocardiogram examinations.                 Risk Factors:Hypertension, Diabetes and Former Smoker. PAD. DVT.   Sonographer:    Clayton Lefort RDCS (AE) Referring Phys: [id number] ERIC J [place] IMPRESSIONS  1. Left ventricular ejection fraction, by estimation, is 50 to 55%. The left ventricle has low normal function. The left ventricle demonstrates regional wall motion abnormalities (see scoring diagram/findings for description). There is mild asymmetric left ventricular hypertrophy of the septal segment. Left ventricular diastolic parameters are consistent with Grade I diastolic dysfunction (impaired relaxation).  2. Right ventricular systolic function is normal. The right ventricular size is normal. There is mildly elevated pulmonary artery systolic pressure.  3. The mitral valve is normal in structure. No evidence of mitral valve regurgitation. No evidence of mitral stenosis.  4. The aortic valve is normal in structure. Aortic valve regurgitation is not visualized. Mild aortic valve sclerosis is present, with no evidence of aortic valve stenosis.  5. The inferior vena cava is dilated in size with >50% respiratory variability, suggesting right atrial pressure of 8 mmHg. FINDINGS  Left Ventricle:  Left ventricular ejection fraction, by estimation, is 50 to 55%. The left ventricle has low normal function. The left ventricle demonstrates regional wall motion abnormalities. The left ventricular internal cavity size was normal in size. There is mild asymmetric left ventricular hypertrophy of the septal segment. Left ventricular diastolic parameters are consistent with Grade I diastolic dysfunction (impaired relaxation).  LV Wall Scoring: The mid anteroseptal segment and mid inferoseptal segment are akinetic. Right Ventricle: The right ventricular size is normal. No increase in right ventricular wall thickness. Right ventricular systolic function is normal. There is mildly elevated pulmonary artery systolic pressure. The tricuspid regurgitant velocity is 2.41  m/s, and with an assumed right atrial pressure of 8 mmHg, the estimated right  ventricular systolic pressure is 21.1 mmHg. Left Atrium: Left atrial size was normal in size. Right Atrium: Right atrial size was normal in size. Pericardium: There is no evidence of pericardial effusion. Mitral Valve: The mitral valve is normal in structure. Normal mobility of the mitral valve leaflets. Mild mitral annular calcification. No evidence of mitral valve regurgitation. No evidence of mitral valve stenosis. Tricuspid Valve: The tricuspid valve is normal in structure. Tricuspid valve regurgitation is trivial. No evidence of tricuspid stenosis. Aortic Valve: The aortic valve is normal in structure.. There is mild thickening and mild calcification of the aortic valve. Aortic valve regurgitation is not visualized. Mild aortic valve sclerosis is present, with no evidence of aortic valve stenosis. There is mild thickening of the aortic valve. There is mild calcification of the aortic valve. Pulmonic Valve: The pulmonic valve was normal in structure. Pulmonic valve regurgitation is not visualized. No evidence of pulmonic stenosis. Aorta: The aortic root is normal in size and structure. Venous: The inferior vena cava is dilated in size with greater than 50% respiratory variability, suggesting right atrial pressure of 8 mmHg. IAS/Shunts: No atrial level shunt detected by color flow Doppler.  LEFT VENTRICLE PLAX 2D LVIDd:         3.86 cm  Diastology LVIDs:         2.76 cm  LV e' lateral:   7.51 cm/s LV PW:         1.14 cm  LV E/e' lateral: 13.2 LV IVS:        1.36 cm  LV e' medial:    5.33 cm/s LVOT diam:     1.90 cm  LV E/e' medial:  18.6 LV SV:         65 LV SV Index:   36 LVOT Area:     2.84 cm  RIGHT VENTRICLE             IVC RV Basal diam:  2.48 cm     IVC diam: 2.34 cm RV S prime:     13.30 cm/s TAPSE (M-mode): 2.2 cm LEFT ATRIUM             Index       RIGHT ATRIUM           Index LA diam:        2.50 cm 1.40 cm/m  RA Area:     12.20 cm LA Vol (A2C):   57.6 ml 32.23 ml/m RA Volume:   26.10 ml  14.61  ml/m LA Vol (A4C):   48.2 ml 26.97 ml/m LA Biplane Vol: 52.6 ml 29.44 ml/m  AORTIC VALVE LVOT Vmax:   90.10 cm/s LVOT Vmean:  57.400 cm/s LVOT VTI:    0.230 m  AORTA Ao Root diam: 3.00 cm Ao  Asc diam:  3.10 cm MITRAL VALVE                TRICUSPID VALVE MV Area (PHT): 3.60 cm     TR Peak grad:   23.2 mmHg MV Decel Time: 211 msec     TR Vmax:        241.00 cm/s MV E velocity: 99.10 cm/s MV A velocity: 152.00 cm/s  SHUNTS MV E/A ratio:  0.65         Systemic VTI:  0.23 m                             Systemic Diam: 1.90 cm Candee Furbish MD Electronically signed by Candee Furbish MD Signature Date/Time: 03/31/2019/12:23:02 PM    Final    VAS [place] LOWER EXTREMITY ARTERIAL DUPLEX  Result Date: 03/26/2019 LOWER EXTREMITY ARTERIAL DUPLEX STUDY Indications: Peripheral artery disease, s/p right EIA stenting and right SFA              angioplasty and stenting. Patient reports that her legs feel "numb"              all the time, and she continues to have bilateral leg pain with              walking. High Risk Factors: Hypertension, Diabetes, past history of smoking.  Vascular Interventions: S/P PTA/stenting of the right external iliac                         artery 05/08/17 and history of right PTA/stenting of the                         entire right SFA in 12/17. Current ABI:            Today's ABIs are 0.50 on the right and 0.49 on the left. Comparison Study: Previous arterial duplex in 11/19 showed right CFA PSV of 257                   cm/sec and a patent right SFA, s/p stent, with PSV of 128                   cm/sec. Performing Technologist: Mariane Masters RVT  Examination Guidelines: A complete evaluation includes B-mode imaging, spectral Doppler, color Doppler, and power Doppler as needed of all accessible portions of each vessel. Bilateral testing is considered an integral part of a complete examination. Limited examinations for reoccurring indications may be performed as noted.   +-----------+--------+-----+--------+----------+---------------------------+ RIGHT      PSV cm/sRatioStenosisWaveform  Comments                    +-----------+--------+-----+--------+----------+---------------------------+ CFA Prox   129                  monophasicturbulent from EIA stenosis +-----------+--------+-----+--------+----------+---------------------------+ DFA        104                  monophasic                            +-----------+--------+-----+--------+----------+---------------------------+ POP Prox   65                   monophasic                            +-----------+--------+-----+--------+----------+---------------------------+  POP Distal 63                   monophasic                            +-----------+--------+-----+--------+----------+---------------------------+ TP Trunk   61                   monophasic                            +-----------+--------+-----+--------+----------+---------------------------+ ATA Distal 26                   monophasic                            +-----------+--------+-----+--------+----------+---------------------------+ PTA Distal 41                   monophasic                            +-----------+--------+-----+--------+----------+---------------------------+ PERO Distal0            occluded                                      +-----------+--------+-----+--------+----------+---------------------------+ Plantar acceleration times range from 213 to 296 ms, which is in the moderately severe.  Right Stent(s): +-------------------+--------+--------+----------+--------+ SFA proximal-distalPSV cm/sStenosisWaveform  Comments +-------------------+--------+--------+----------+--------+ Prox to Stent      92              monophasic         +-------------------+--------+--------+----------+--------+ Proximal Stent     118             monophasic          +-------------------+--------+--------+----------+--------+ Mid Stent          128             monophasic         +-------------------+--------+--------+----------+--------+ Distal Stent       80              monophasic         +-------------------+--------+--------+----------+--------+ Distal to Stent    133             monophasic         +-------------------+--------+--------+----------+--------+    +-----------+-------+-----+--------------+----------+--------------------------+ LEFT       PSV    RatioStenosis      Waveform  Comments                              cm/s                                                           +-----------+-------+-----+--------------+----------+--------------------------+ CFA Prox   164         30-49%        monophasicturbulent from EIA  stenosis                stenosis                   +-----------+-------+-----+--------------+----------+--------------------------+ DFA        130                       monophasic                           +-----------+-------+-----+--------------+----------+--------------------------+ SFA Prox   47                        monophasicplaque                     +-----------+-------+-----+--------------+----------+--------------------------+ SFA Mid    0           occluded                plaque                     +-----------+-------+-----+--------------+----------+--------------------------+ SFA Distal 112                       monophasicreconstituted              +-----------+-------+-----+--------------+----------+--------------------------+ POP Prox   54                        monophasic                           +-----------+-------+-----+--------------+----------+--------------------------+ POP Distal 50                        monophasic                            +-----------+-------+-----+--------------+----------+--------------------------+ TP Trunk   46                        monophasic                           +-----------+-------+-----+--------------+----------+--------------------------+ ATA Distal 45                        monophasic                           +-----------+-------+-----+--------------+----------+--------------------------+ PTA Distal 48                        monophasic                           +-----------+-------+-----+--------------+----------+--------------------------+ PERO Distal0           occluded                                           +-----------+-------+-----+--------------+----------+--------------------------+ Left plantar acceleration times range from 271 to 325 ms which is in the severe ischemia range.  Summary: Right: Dampened, monophasic flow throughout the right lower extremity. The right  SFA stent is patent. Occlusion of the peroneal artery. Left: 30-49% stenosis of the common femoral artery. Occlusion of the SFA with reconstition in the distal segment. Occlusion of the peroneal artery.  See table(s) above for measurements and observations. See aorta-iliac duplex and ABI reports. Vascular consult recommended. Patient seeing Dr. Gwenlyn Found on 03/27/19. Electronically signed by Quay Burow MD on 03/26/2019 at 7:32:01 AM.    Final    VAS [place] LOWER EXTREMITY VENOUS (DVT)  Result Date: 03/31/2019  Lower Venous DVTStudy Indications: Edema, and History of untreated DVT in right Popliteal, posterior tibial, and peroneal veins, 11/15/18.  Risk Factors: Cancer Stage III rectal cancer 2019 Acute kidney injury, CKD. Comparison Study: Prior study from 11/15/18 is available for comparison Performing Technologist: Sharion Dove RVS  Examination Guidelines: A complete evaluation includes B-mode imaging, spectral Doppler, color Doppler, and power Doppler as needed of all accessible portions of each vessel. Bilateral  testing is considered an integral part of a complete examination. Limited examinations for reoccurring indications may be performed as noted. The reflux portion of the exam is performed with the patient in reverse Trendelenburg.  +---------+---------------+---------+-----------+----------+-----------------+ RIGHT    CompressibilityPhasicitySpontaneityPropertiesThrombus Aging    +---------+---------------+---------+-----------+----------+-----------------+ CFV      Full           Yes      Yes                                    +---------+---------------+---------+-----------+----------+-----------------+ SFJ      Full                                                           +---------+---------------+---------+-----------+----------+-----------------+ FV Prox  Full                                                           +---------+---------------+---------+-----------+----------+-----------------+ FV Mid   Full                                                           +---------+---------------+---------+-----------+----------+-----------------+ FV DistalFull                                                           +---------+---------------+---------+-----------+----------+-----------------+ PFV      Full                                                           +---------+---------------+---------+-----------+----------+-----------------+ POP      Partial        Yes  Yes                  Age Indeterminate +---------+---------------+---------+-----------+----------+-----------------+ PTV      Partial                                      Chronic           +---------+---------------+---------+-----------+----------+-----------------+ PERO     Partial                                      Chronic           +---------+---------------+---------+-----------+----------+-----------------+    +---------+---------------+---------+-----------+----------+--------------+ LEFT     CompressibilityPhasicitySpontaneityPropertiesThrombus Aging +---------+---------------+---------+-----------+----------+--------------+ CFV      Full           Yes      Yes                                 +---------+---------------+---------+-----------+----------+--------------+ SFJ      Full                                                        +---------+---------------+---------+-----------+----------+--------------+ FV Prox  Full                                                        +---------+---------------+---------+-----------+----------+--------------+ FV Mid   Full                                                        +---------+---------------+---------+-----------+----------+--------------+ FV DistalFull                                                        +---------+---------------+---------+-----------+----------+--------------+ PFV      Full                                                        +---------+---------------+---------+-----------+----------+--------------+ POP      Full           Yes      Yes                                 +---------+---------------+---------+-----------+----------+--------------+ PTV      Full                                                        +---------+---------------+---------+-----------+----------+--------------+  PERO     Full                                                        +---------+---------------+---------+-----------+----------+--------------+     Summary: RIGHT: - Findings consistent with age indeterminate deep vein thrombosis involving the right popliteal vein. 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 Significant interstitial fluid - Ultrasound characteristics of enlarged lymph nodes are noted in the  groin.  LEFT: - There is no evidence of deep vein thrombosis in the lower extremity.  Significant interstitial fluid.  *See table(s) above for measurements and observations. Electronically signed by Ruta Hinds MD on 03/31/2019 at 12:40:59 PM.    Final    VAS US AORTA/IVC/ILIACS  Result Date: 03/26/2019 ABDOMINAL AORTA STUDY Indications: PAD, s/p right EIA stenting in 2019. Patient reports that her legs              feel "numb", and she continues to have bilateral leg pain with              walking. Risk Factors: Hypertension, Diabetes, past history of smoking. Vascular Interventions: S/P PTA/stenting of the right external iliac artery                         05/08/17 and history of right PTA/stenting of the entire                         right SFA in 12/17. Limitations: Air/bowel gas and colostomy bag.  Comparison Study: In 11/19, an aorta-iliac duplex showed right common iliac PSV                   of 265 cm/sec, right external iliac PSV of 456 cm/sec, left                   common iliac PSV of 271 cm/sec and left external iliac PSV of                   503 cm/sec. Performing Technologist: Mariane Masters RVT  Examination Guidelines: A complete evaluation includes B-mode imaging, spectral Doppler, color Doppler, and power Doppler as needed of all accessible portions of each vessel. Bilateral testing is considered an integral part of a complete examination. Limited examinations for reoccurring indications may be performed as noted.  Abdominal Aorta Findings: +-------------+-------+----------+---------+----------+--------+---------------+ Location     AP (cm)Trans (cm)PSV      Waveform  ThrombusComments                                      (cm/s)                                     +-------------+-------+----------+---------+----------+--------+---------------+ Proximal     2.00   1.90      67       biphasic                           +-------------+-------+----------+---------+----------+--------+---------------+ Mid  1.50   1.50      55       monophasic                        +-------------+-------+----------+---------+----------+--------+---------------+ Distal       1.40   1.50      101      monophasic                        +-------------+-------+----------+---------+----------+--------+---------------+ RT CIA Prox                   161      monophasic        turbulent       +-------------+-------+----------+---------+----------+--------+---------------+ RT CIA Mid                    119      monophasic                        +-------------+-------+----------+---------+----------+--------+---------------+ RT CIA Distal                 165      monophasic                        +-------------+-------+----------+---------+----------+--------+---------------+ RT EIA Prox                   245      monophasic                        +-------------+-------+----------+---------+----------+--------+---------------+ RT EIA Mid                    660      stenotic          severe stenosis +-------------+-------+----------+---------+----------+--------+---------------+ RT EIA Distal                 224      monophasic        turbulent       +-------------+-------+----------+---------+----------+--------+---------------+ LT CIA Prox                   152      monophasic                        +-------------+-------+----------+---------+----------+--------+---------------+ LT CIA Mid                    149      monophasic                        +-------------+-------+----------+---------+----------+--------+---------------+ LT CIA Distal                 116      monophasic                        +-------------+-------+----------+---------+----------+--------+---------------+ LT EIA Prox                   207      monophasic                         +-------------+-------+----------+---------+----------+--------+---------------+ LT EIA Mid                    703  stenotic          severe stenosis +-------------+-------+----------+---------+----------+--------+---------------+ LT EIA Distal                 228      monophasic                        +-------------+-------+----------+---------+----------+--------+---------------+ Visualization of the Distal Abdominal Aorta and Right CIA Distal artery was limited due to colostomy bag placement. Stent struts not clearly visualized in the right external iliac artery therefore velocities are placed in the native table. IVC/Iliac Findings: +--------+------+--------+--------+   IVC   PatentThrombusComments +--------+------+--------+--------+ IVC Proxpatent                 +--------+------+--------+--------+    Summary: Abdominal Aorta: No evidence of an abdominal aortic aneurysm was visualized. The largest aortic measurement is 2.0 cm. Stenosis: +--------------------+-------------+---------------+-----------------+ Location            Stenosis     Stent          Comments          +--------------------+-------------+---------------+-----------------+ Right External Iliac             50-99% stenosissevere/high-grade +--------------------+-------------+---------------+-----------------+ Left External Iliac >50% stenosis               severe/high-grade +--------------------+-------------+---------------+-----------------+ Atherosclerosis noted throughout the aorta and bilateral iliac arteries. Significantly elevated velocities noted in the bilateral external iliac arteries. Moderate progression noted from previous exam.  *See table(s) above for measurements and observations. See ABI and arterial duplex reports. Vascular consult recommended. Patient scheduled to see Dr. Gwenlyn Found on 03/27/19.  Electronically signed by Quay Burow MD on 03/26/2019 at 7:33:16 AM.    Final      Subjective: Eager to go home  Discharge Exam: Vitals:   04/04/19 0759 04/04/19 1504  BP: (!) 134/52 (!) 135/57  Pulse: 71 82  Resp: 17 17  Temp: 98.3 F (36.8 C) 97.7 F (36.5 C)  SpO2: 99% 100%   Vitals:   04/03/19 2058 04/04/19 0429 04/04/19 0759 04/04/19 1504  BP: (!) 117/52 (!) 133/56 (!) 134/52 (!) 135/57  Pulse: 84 84 71 82  Resp: 15 14 17 17   Temp: 98.5 F (36.9 C) 98.5 F (36.9 C) 98.3 F (36.8 C) 97.7 F (36.5 C)  TempSrc: Oral Oral Oral Oral  SpO2: 100% 100% 99% 100%  Weight:      Height:        General: Pt is alert, awake, not in acute distress Cardiovascular: RRR, S1/S2 +, no rubs, no gallops Respiratory: CTA bilaterally, no wheezing, no rhonchi Abdominal: Soft, NT, ND, bowel sounds + Extremities: no edema, no cyanosis   The results of significant diagnostics from this hospitalization (including imaging, microbiology, ancillary and laboratory) are listed below for reference.     Microbiology: Recent Results (from the past 240 hour(s))  SARS CORONAVIRUS 2 (TAT 6-24 HRS) Nasopharyngeal Nasopharyngeal Swab     Status: None   Collection Time: 03/29/19 10:38 PM   Specimen: Nasopharyngeal Swab  Result Value Ref Range Status   SARS Coronavirus 2 NEGATIVE NEGATIVE Final    Comment: (NOTE) SARS-CoV-2 target nucleic acids are NOT DETECTED. The SARS-CoV-2 RNA is generally detectable in upper and lower respiratory specimens during the acute phase of infection. Negative results do not preclude SARS-CoV-2 infection, do not rule out co-infections with other pathogens, and should not be used as the sole basis for treatment or other patient management decisions. Negative results must be  combined with clinical observations, patient history, and epidemiological information. The expected result is Negative. Fact Sheet for Patients: SugarRoll.be Fact Sheet for Healthcare Providers: https://www.woods-mathews.com/ This  test is not yet approved or cleared by the [place] FDA and  has been authorized for detection and/or diagnosis of SARS-CoV-2 by FDA under an Emergency Use Authorization (EUA). This EUA will remain  in effect (meaning this test can be used) for the duration of the COVID-19 declaration under Section 56 4(b)(1) of the Act, 21 U.S.C. section 360bbb-3(b)(1), unless the authorization is terminated or revoked sooner. Performed at [facility], [place] [address], [place], [place] [address]      Labs: BNP (last 3 results) No results for input(s): BNP in the last 8760 hours. Basic Metabolic Panel: Recent Labs  Lab 04/01/19 0455 04/02/19 0323 04/02/19 1849 04/03/19 0419 04/04/19 0402  NA 139 138 135 136 136  K 5.2* 6.0* 5.5* 6.1* 5.5*  CL 113* 115* 110 111 112*  CO2 20* 19* 19* 18* 18*  GLUCOSE 95 98 134* 112* 98  BUN 46* 46* 41* 37* 38*  CREATININE 3.53* 3.51* 3.29* 3.28* 3.31*  CALCIUM 9.1 8.9 9.0 9.1 9.0  PHOS  --   --  3.5 3.9  --    Liver Function Tests: Recent Labs  Lab 04/02/19 1849 04/03/19 0419  ALBUMIN 2.8* 2.7*   No results for input(s): LIPASE, AMYLASE in the last 168 hours. No results for input(s): AMMONIA in the last 168 hours. CBC: Recent Labs  Lab 03/30/19 0346 03/30/19 0346 03/31/19 0338 03/31/19 0836 04/01/19 0455 04/02/19 0323 04/03/19 0419  WBC 3.8*  --  3.1*  --  2.8* 2.9* 3.0*  HGB 7.6*  --  7.0*  --  6.7* 6.3* 6.6*  HCT 23.6*   < > 22.8* 22.6* 21.4* 20.8* 21.2*  MCV 98.3  --  98.7  --  98.6 100.5* 101.0*  PLT 324  --  313  --  305 275 295   < > = values in this interval not displayed.   Cardiac Enzymes: No results for input(s): CKTOTAL, CKMB, CKMBINDEX, TROPONINI in the last 168 hours. BNP: Invalid input(s): POCBNP CBG: Recent Labs  Lab 04/03/19 1119 04/03/19 1611 04/03/19 2050 04/04/19 0657 04/04/19 1129  GLUCAP 125* 128* 125* 117* 160*   D-Dimer No results for input(s): DDIMER in the last 72 hours. Hgb A1c No results  for input(s): HGBA1C in the last 72 hours. Lipid Profile No results for input(s): CHOL, HDL, LDLCALC, TRIG, CHOLHDL, LDLDIRECT in the last 72 hours. Thyroid function studies No results for input(s): TSH, T4TOTAL, T3FREE, THYROIDAB in the last 72 hours.  Invalid input(s): FREET3 Anemia work up No results for input(s): VITAMINB12, FOLATE, FERRITIN, TIBC, IRON, RETICCTPCT in the last 72 hours. Urinalysis    Component Value Date/Time   COLORURINE YELLOW 03/30/2019 0759   APPEARANCEUR HAZY (A) 03/30/2019 0759   LABSPEC 1.011 03/30/2019 0759   PHURINE 5.0 03/30/2019 0759   GLUCOSEU NEGATIVE 03/30/2019 0759   HGBUR NEGATIVE 03/30/2019 0759   BILIRUBINUR NEGATIVE 03/30/2019 0759   BILIRUBINUR negative 11/27/2014 1052   KETONESUR NEGATIVE 03/30/2019 0759   PROTEINUR NEGATIVE 03/30/2019 0759   UROBILINOGEN 0.2 11/27/2014 1052   NITRITE NEGATIVE 03/30/2019 0759   LEUKOCYTESUR MODERATE (A) 03/30/2019 0759   Sepsis Labs Invalid input(s): PROCALCITONIN,  WBC,  LACTICIDVEN Microbiology Recent Results (from the past 240 hour(s))  SARS CORONAVIRUS 2 (TAT 6-24 HRS) Nasopharyngeal Nasopharyngeal Swab     Status: None   Collection Time:  03/29/19 10:38 PM   Specimen: Nasopharyngeal Swab  Result Value Ref Range Status   SARS Coronavirus 2 NEGATIVE NEGATIVE Final    Comment: (NOTE) SARS-CoV-2 target nucleic acids are NOT DETECTED. The SARS-CoV-2 RNA is generally detectable in upper and lower respiratory specimens during the acute phase of infection. Negative results do not preclude SARS-CoV-2 infection, do not rule out co-infections with other pathogens, and should not be used as the sole basis for treatment or other patient management decisions. Negative results must be combined with clinical observations, patient history, and epidemiological information. The expected result is Negative. Fact Sheet for Patients: SugarRoll.be Fact Sheet for Healthcare  Providers: https://www.woods-mathews.com/ This test is not yet approved or cleared by the [place] FDA and  has been authorized for detection and/or diagnosis of SARS-CoV-2 by FDA under an Emergency Use Authorization (EUA). This EUA will remain  in effect (meaning this test can be used) for the duration of the COVID-19 declaration under Section 56 4(b)(1) of the Act, 21 U.S.C. section 360bbb-3(b)(1), unless the authorization is terminated or revoked sooner. Performed at [facility], [place] [address], [place], [place] [address]    Time spent: 5min  SIGNED:   Marylu Lund, MD  [facility] 04/04/2019, 3:10 PM  If 7PM-7AM, please contact night-coverage

## 2019-04-04 NOTE — Care Management Important Message (Signed)
Important Message  Patient Details  Name: Nicole Sellers MRN: [id number] Date of Birth: [birth year]   Medicare Important Message Given:  Yes     Memory Argue 04/04/2019, 3:03 PM

## 2019-04-10 ENCOUNTER — Ambulatory Visit: Payer: Medicare Other

## 2019-04-12 ENCOUNTER — Telehealth: Payer: Self-pay | Admitting: Hospice

## 2019-04-16 ENCOUNTER — Ambulatory Visit: Payer: Medicare Other | Attending: Internal Medicine

## 2019-04-16 DIAGNOSIS — Z23 Encounter for immunization: Secondary | ICD-10-CM

## 2019-04-16 NOTE — Progress Notes (Signed)
   Covid-19 Vaccination Clinic  Name:  Nicole Sellers    MRN: [id number] DOB: Aug 12, 1950  04/16/2019  Nicole Sellers was observed post Covid-19 immunization for 15 minutes without incident. She was provided with Vaccine Information Sheet and instruction to access the V-Safe system.   Nicole Sellers was instructed to call 911 with any severe reactions post vaccine: Marland Kitchen Difficulty breathing  . Swelling of face and throat  . A fast heartbeat  . A bad rash all over body  . Dizziness and weakness   Immunizations Administered    Name Date Dose VIS Date Route   Pfizer COVID-19 Vaccine 04/16/2019 11:59 AM 0.3 mL 12/21/2018 Intramuscular   Manufacturer: [employer], [employer]   Lot: [id number]   [place]: [id number]

## 2019-04-23 ENCOUNTER — Encounter (HOSPITAL_COMMUNITY): Payer: Self-pay

## 2019-04-25 ENCOUNTER — Other Ambulatory Visit: Payer: Medicare Other | Admitting: Hospice

## 2019-04-25 ENCOUNTER — Other Ambulatory Visit: Payer: Self-pay

## 2019-04-25 DIAGNOSIS — C20 Malignant neoplasm of rectum: Secondary | ICD-10-CM

## 2019-04-25 DIAGNOSIS — Z515 Encounter for palliative care: Secondary | ICD-10-CM

## 2019-04-25 NOTE — Progress Notes (Signed)
[facility] Consult Note Telephone: [phone]  Fax: [phone]  PATIENT NAME: Nicole Sellers DOB: Feb 15, 1950 MRN: [id number]  PRIMARY CARE PROVIDER:   Reynold Bowen, MD  REFERRING PROVIDER:  Reynold Bowen, MD [place],  [place] [address]  RESPONSIBLE PARTY:   Self [phone] Contact - son Veyda Kaufman [phone]  RECOMMENDATIONS/PLAN:   Advance Care Planning/Goals of Care:  Visit at the request of Dr. Reynold Bowen for palliative consult. Visit consisted of building trust and discussions on Palliative Medicine as specialized medical care for people living with serious illness, aimed at facilitating better quality of life through symptoms relief, assisting with advance care plan and establishing goals of care. Patient affirmed she is a DNR.  She did not find her DNR form at home. NP signed a DNR form for her, advised to keep in a visible place like on her fridge.  MOST selections in Epic include limited additional intervention, determine use of antibiotics, IV fluids for defined period of time,  No feeding tube. Goals of care include to maximize quality of life and symptom management. Symptom management: Patient was hospitalized last month for AKI, abnormal labs Creatinine elevated to 5.01 up from 2.24 Nov 2018, GFR on 04/04/2019 was 15. Appointment with Kidney doctor next week; followed by Cardiologist for PAD. Encouraged elevation of extremities and she verbalized understanding. She continues on Januvia for Type 2 DM, current A1c is 5.4. She denies pain/discomfort, hypoglycemic events, no palpitations, dizziness. She lives alone, fairly independent, ambulates with a cane for support; in no acute distress.  Follow up: Palliative care will continue to follow patient for goals of care clarification and symptom management. I spent one hour and 28 minutes providing this initial consultation; time includes chart review and  documentation. More than 50% of the time in this consultation was spent on coordinating communication HISTORY OF PRESENT ILLNESS:  Nicole Sellers is a 69 y.o. year old female with multiple medical problems including stage III rectal cancer diagnosed 2019 status post chemotherapy/resection/colostomy bag, PAD, T2DM, peripheral neuropathy, DVT. Palliative Care was asked to help address goals of care.   CODE STATUS: DNR  PPS: 60% HOSPICE ELIGIBILITY/DIAGNOSIS: TBD  PAST MEDICAL HISTORY:  Past Medical History:  Diagnosis Date  . Age-related nuclear cataract, right eye   . Allergy   . Anemia   . Arthritis    "joints sometimes" (12/25/2012)  . Clotting disorder ([place])   . Colon cancer ([place])   . Critical lower limb ischemia   . Diverticulosis   . Gangrene of toe, Rt second toe 12/25/2012  . GERD (gastroesophageal reflux disease)   . High cholesterol   . Hypertension   . Hypothyroidism   . PAD (peripheral artery disease) ([facility])   . PAD (peripheral artery disease) ([place])   . Peripheral neuropathy   . Rectal cancer ([place])   . S/P arterial stent, 12/25/13, successful diamondback orbital rotational arthrectomy, PTA using chocolate  balloon and stenting using I DEV stent of long segment calcified high-grade proximal and mid r 12/25/2012  . Tobacco abuse   . Type II diabetes mellitus ([place])     SOCIAL HX:  Social History   Tobacco Use  . Smoking status: Former Smoker    Packs/day: 0.50    Years: 48.00    Pack years: 24.00    Types: Cigarettes  . Smokeless tobacco: Never Used  . Tobacco comment: quit in 2016  Substance Use Topics  .  Alcohol use: No    Alcohol/week: 0.0 standard drinks    ALLERGIES:  Allergies  Allergen Reactions  . Kiwi Extract Itching and Other (See Comments)    Throat itching   . Lisinopril Other (See Comments)    Bruising   . Plavix [Clopidogrel] Other (See Comments)    "Made me feel badly- didn't agree with my system"     PERTINENT MEDICATIONS:    Outpatient Encounter Medications as of 04/25/2019  Medication Sig  . acetaminophen (TYLENOL) 500 MG tablet Take 500 mg by mouth 2 (two) times daily as needed for mild pain, moderate pain or headache.   . albuterol (PROVENTIL HFA;VENTOLIN HFA) 108 (90 BASE) MCG/ACT inhaler Inhale 2 puffs into the lungs every 6 (six) hours as needed for wheezing or shortness of breath.  Marland Kitchen apixaban (ELIQUIS) 5 MG TABS tablet Take 1 tablet (5 mg total) by mouth 2 (two) times daily.  Marland Kitchen aspirin 81 MG tablet Take 81 mg by mouth daily.  Marland Kitchen atorvastatin (LIPITOR) 10 MG tablet Take 1 tablet (10 mg total) by mouth daily. (Patient taking differently: Take 10 mg by mouth at bedtime. )  . Calcium Carbonate-Vitamin D (CALCIUM 600+D PO) Take 1 tablet by mouth daily.  . cetirizine (ZYRTEC) 10 MG tablet Take 10 mg by mouth daily.  . Cholecalciferol (VITAMIN D3) 50 MCG (2000 UT) TABS Take 4,000 Units by mouth daily.  Marland Kitchen FERREX 150 150 MG capsule Take 150 mg by mouth 2 (two) times daily.  Marland Kitchen gabapentin (NEURONTIN) 300 MG capsule Take 300 mg by mouth 2 (two) times daily.   Marland Kitchen levothyroxine (SYNTHROID, LEVOTHROID) 50 MCG tablet Take 1 tablet (50 mcg total) by mouth daily.  . Multiple Vitamins-Minerals (SENIOR MULTIVITAMIN PLUS PO) Take 1 tablet by mouth daily with breakfast.  . omeprazole (PRILOSEC OTC) 20 MG tablet Take 20 mg by mouth daily before breakfast.  . Psyllium (METAMUCIL FIBER PO) Take 1 Scoop by mouth See admin instructions. Mix 1 scoopful into 6-8 ounces of desired beverage and drink two times a day  . sitaGLIPtin (JANUVIA) 25 MG tablet Take 1 tablet (25 mg total) by mouth daily.  . sodium zirconium cyclosilicate (LOKELMA) 10 g PACK packet Take 10 g by mouth 2 (two) times daily.  Marland Kitchen tetrahydrozoline-zinc (VISINE-AC) 0.05-0.25 % ophthalmic solution Place 1 drop into both eyes 2 (two) times daily as needed (dry eyes).  . traMADol (ULTRAM) 50 MG tablet Take 1 tablet (50 mg total) by mouth every 12 (twelve) hours as needed for  moderate pain.  . Triamcinolone Acetonide (NASACORT ALLERGY 24HR NA) Place 1 spray into both nostrils daily as needed (allergies).   Loura Pardon Salicylate (ASPERCREME EX) Apply 1 application topically daily as needed (to affected areas, for joint pain).   . vitamin B-12 (CYANOCOBALAMIN) 500 MCG tablet Take 500 mcg by mouth daily.   Facility-Administered Encounter Medications as of 04/25/2019  Medication  . 0.9 %  sodium chloride infusion  . acetaminophen (TYLENOL) tablet 650 mg   Or  . acetaminophen (TYLENOL) suppository 650 mg  . diphenhydrAMINE (BENADRYL) 12.5 MG/5ML elixir 12.5 mg   Or  . diphenhydrAMINE (BENADRYL) injection 12.5 mg  . heparin lock flush 100 unit/mL  . ondansetron (ZOFRAN-ODT) disintegrating tablet 4 mg   Or  . ondansetron (ZOFRAN) 4 mg in sodium chloride 0.9 % 50 mL IVPB  . polycarbophil (FIBERCON) tablet 625 mg  . sodium chloride 0.9 % bolus 1,000 mL  . sodium chloride flush (NS) 0.9 % injection 10 mL  PHYSICAL EXAM:   General: NAD, cooperative Cardiovascular: regular rate and rhythm Pulmonary: clear ant/post fields; normal respiratory effor, no coughing no shortness of breath. Abdomen: soft, nontender, + bowel sounds; Colostomy/bag patent, unremarkeable GU: no suprapubic tenderness Extremities: BLE edema wrapped in gauze secured with tape; dressing clean dry and intact Skin: no rashes to exposed skin Neurological: Weakness but otherwise nonfocal  Teodoro Spray, NP

## 2019-05-15 ENCOUNTER — Other Ambulatory Visit (HOSPITAL_COMMUNITY): Payer: Self-pay | Admitting: *Deleted

## 2019-05-15 NOTE — Discharge Instructions (Signed)

## 2019-05-16 ENCOUNTER — Encounter (HOSPITAL_COMMUNITY): Payer: Self-pay

## 2019-05-16 ENCOUNTER — Inpatient Hospital Stay (HOSPITAL_COMMUNITY)
Admission: RE | Admit: 2019-05-16 | Discharge: 2019-05-16 | Disposition: A | Discharge status: Home / Self Care | Payer: Medicare Other | Source: Ambulatory Visit | Attending: Internal Medicine | Admitting: Internal Medicine

## 2019-05-21 ENCOUNTER — Encounter (HOSPITAL_COMMUNITY): Payer: Self-pay | Admitting: Internal Medicine

## 2019-05-21 ENCOUNTER — Emergency Department (HOSPITAL_COMMUNITY): Payer: Medicare Other

## 2019-05-21 ENCOUNTER — Inpatient Hospital Stay (HOSPITAL_COMMUNITY)
Admission: EM | Admit: 2019-05-21 | Discharge: 2019-05-31 | DRG: 291 | Disposition: A | Discharge status: Hospice | Payer: Medicare Other | Attending: Internal Medicine | Admitting: Internal Medicine

## 2019-05-21 ENCOUNTER — Other Ambulatory Visit: Payer: Self-pay

## 2019-05-21 DIAGNOSIS — Z515 Encounter for palliative care: Secondary | ICD-10-CM | POA: Diagnosis not present

## 2019-05-21 DIAGNOSIS — E875 Hyperkalemia: Secondary | ICD-10-CM | POA: Diagnosis present

## 2019-05-21 DIAGNOSIS — R7401 Elevation of levels of liver transaminase levels: Secondary | ICD-10-CM

## 2019-05-21 DIAGNOSIS — E785 Hyperlipidemia, unspecified: Secondary | ICD-10-CM | POA: Diagnosis present

## 2019-05-21 DIAGNOSIS — K219 Gastro-esophageal reflux disease without esophagitis: Secondary | ICD-10-CM | POA: Diagnosis present

## 2019-05-21 DIAGNOSIS — I6523 Occlusion and stenosis of bilateral carotid arteries: Secondary | ICD-10-CM | POA: Diagnosis present

## 2019-05-21 DIAGNOSIS — N179 Acute kidney failure, unspecified: Secondary | ICD-10-CM | POA: Diagnosis present

## 2019-05-21 DIAGNOSIS — E039 Hypothyroidism, unspecified: Secondary | ICD-10-CM | POA: Diagnosis present

## 2019-05-21 DIAGNOSIS — J9601 Acute respiratory failure with hypoxia: Secondary | ICD-10-CM | POA: Diagnosis present

## 2019-05-21 DIAGNOSIS — Z8249 Family history of ischemic heart disease and other diseases of the circulatory system: Secondary | ICD-10-CM

## 2019-05-21 DIAGNOSIS — Z9049 Acquired absence of other specified parts of digestive tract: Secondary | ICD-10-CM

## 2019-05-21 DIAGNOSIS — R531 Weakness: Secondary | ICD-10-CM | POA: Diagnosis not present

## 2019-05-21 DIAGNOSIS — L02414 Cutaneous abscess of left upper limb: Secondary | ICD-10-CM | POA: Diagnosis present

## 2019-05-21 DIAGNOSIS — B9561 Methicillin susceptible Staphylococcus aureus infection as the cause of diseases classified elsewhere: Secondary | ICD-10-CM | POA: Diagnosis present

## 2019-05-21 DIAGNOSIS — Z89421 Acquired absence of other right toe(s): Secondary | ICD-10-CM

## 2019-05-21 DIAGNOSIS — E876 Hypokalemia: Secondary | ICD-10-CM | POA: Diagnosis not present

## 2019-05-21 DIAGNOSIS — I130 Hypertensive heart and chronic kidney disease with heart failure and stage 1 through stage 4 chronic kidney disease, or unspecified chronic kidney disease: Secondary | ICD-10-CM | POA: Diagnosis present

## 2019-05-21 DIAGNOSIS — C20 Malignant neoplasm of rectum: Secondary | ICD-10-CM | POA: Diagnosis not present

## 2019-05-21 DIAGNOSIS — I70203 Unspecified atherosclerosis of native arteries of extremities, bilateral legs: Secondary | ICD-10-CM | POA: Diagnosis present

## 2019-05-21 DIAGNOSIS — Z79890 Hormone replacement therapy (postmenopausal): Secondary | ICD-10-CM

## 2019-05-21 DIAGNOSIS — Z923 Personal history of irradiation: Secondary | ICD-10-CM

## 2019-05-21 DIAGNOSIS — Z7189 Other specified counseling: Secondary | ICD-10-CM

## 2019-05-21 DIAGNOSIS — Z91018 Allergy to other foods: Secondary | ICD-10-CM

## 2019-05-21 DIAGNOSIS — I824Y1 Acute embolism and thrombosis of unspecified deep veins of right proximal lower extremity: Secondary | ICD-10-CM | POA: Diagnosis not present

## 2019-05-21 DIAGNOSIS — E8809 Other disorders of plasma-protein metabolism, not elsewhere classified: Secondary | ICD-10-CM | POA: Diagnosis present

## 2019-05-21 DIAGNOSIS — K761 Chronic passive congestion of liver: Secondary | ICD-10-CM | POA: Diagnosis present

## 2019-05-21 DIAGNOSIS — Z66 Do not resuscitate: Secondary | ICD-10-CM | POA: Diagnosis present

## 2019-05-21 DIAGNOSIS — E119 Type 2 diabetes mellitus without complications: Secondary | ICD-10-CM

## 2019-05-21 DIAGNOSIS — R0902 Hypoxemia: Secondary | ICD-10-CM

## 2019-05-21 DIAGNOSIS — D631 Anemia in chronic kidney disease: Secondary | ICD-10-CM | POA: Diagnosis present

## 2019-05-21 DIAGNOSIS — Z86718 Personal history of other venous thrombosis and embolism: Secondary | ICD-10-CM

## 2019-05-21 DIAGNOSIS — D638 Anemia in other chronic diseases classified elsewhere: Secondary | ICD-10-CM | POA: Diagnosis not present

## 2019-05-21 DIAGNOSIS — I509 Heart failure, unspecified: Secondary | ICD-10-CM

## 2019-05-21 DIAGNOSIS — I82409 Acute embolism and thrombosis of unspecified deep veins of unspecified lower extremity: Secondary | ICD-10-CM | POA: Diagnosis present

## 2019-05-21 DIAGNOSIS — N184 Chronic kidney disease, stage 4 (severe): Secondary | ICD-10-CM | POA: Diagnosis not present

## 2019-05-21 DIAGNOSIS — H2511 Age-related nuclear cataract, right eye: Secondary | ICD-10-CM | POA: Diagnosis present

## 2019-05-21 DIAGNOSIS — I447 Left bundle-branch block, unspecified: Secondary | ICD-10-CM | POA: Diagnosis present

## 2019-05-21 DIAGNOSIS — Z955 Presence of coronary angioplasty implant and graft: Secondary | ICD-10-CM

## 2019-05-21 DIAGNOSIS — Z85048 Personal history of other malignant neoplasm of rectum, rectosigmoid junction, and anus: Secondary | ICD-10-CM

## 2019-05-21 DIAGNOSIS — Z7984 Long term (current) use of oral hypoglycemic drugs: Secondary | ICD-10-CM

## 2019-05-21 DIAGNOSIS — N185 Chronic kidney disease, stage 5: Secondary | ICD-10-CM

## 2019-05-21 DIAGNOSIS — E8770 Fluid overload, unspecified: Secondary | ICD-10-CM | POA: Diagnosis present

## 2019-05-21 DIAGNOSIS — I5043 Acute on chronic combined systolic (congestive) and diastolic (congestive) heart failure: Secondary | ICD-10-CM | POA: Diagnosis present

## 2019-05-21 DIAGNOSIS — E1122 Type 2 diabetes mellitus with diabetic chronic kidney disease: Secondary | ICD-10-CM | POA: Diagnosis present

## 2019-05-21 DIAGNOSIS — E872 Acidosis: Secondary | ICD-10-CM | POA: Diagnosis present

## 2019-05-21 DIAGNOSIS — D509 Iron deficiency anemia, unspecified: Secondary | ICD-10-CM | POA: Diagnosis present

## 2019-05-21 DIAGNOSIS — Z20822 Contact with and (suspected) exposure to covid-19: Secondary | ICD-10-CM | POA: Diagnosis present

## 2019-05-21 DIAGNOSIS — I5041 Acute combined systolic (congestive) and diastolic (congestive) heart failure: Secondary | ICD-10-CM | POA: Diagnosis not present

## 2019-05-21 DIAGNOSIS — Z9862 Peripheral vascular angioplasty status: Secondary | ICD-10-CM

## 2019-05-21 DIAGNOSIS — Z833 Family history of diabetes mellitus: Secondary | ICD-10-CM

## 2019-05-21 DIAGNOSIS — E1140 Type 2 diabetes mellitus with diabetic neuropathy, unspecified: Secondary | ICD-10-CM | POA: Diagnosis present

## 2019-05-21 DIAGNOSIS — Z79899 Other long term (current) drug therapy: Secondary | ICD-10-CM

## 2019-05-21 DIAGNOSIS — Z7982 Long term (current) use of aspirin: Secondary | ICD-10-CM

## 2019-05-21 DIAGNOSIS — I5031 Acute diastolic (congestive) heart failure: Secondary | ICD-10-CM | POA: Diagnosis not present

## 2019-05-21 DIAGNOSIS — Z888 Allergy status to other drugs, medicaments and biological substances status: Secondary | ICD-10-CM

## 2019-05-21 DIAGNOSIS — Z87891 Personal history of nicotine dependence: Secondary | ICD-10-CM

## 2019-05-21 DIAGNOSIS — Z7901 Long term (current) use of anticoagulants: Secondary | ICD-10-CM

## 2019-05-21 LAB — CBC WITH DIFFERENTIAL/PLATELET
Abs Immature Granulocytes: 0.01 10*3/uL (ref 0.00–0.07)
Basophils Absolute: 0 10*3/uL (ref 0.0–0.1)
Basophils Relative: 1 %
Eosinophils Absolute: 0.1 10*3/uL (ref 0.0–0.5)
Eosinophils Relative: 4 %
HCT: 24.1 % — ABNORMAL LOW (ref 36.0–46.0)
Hemoglobin: 7.4 g/dL — ABNORMAL LOW (ref 12.0–15.0)
Immature Granulocytes: 0 %
Lymphocytes Relative: 10 %
Lymphs Abs: 0.3 10*3/uL — ABNORMAL LOW (ref 0.7–4.0)
MCH: 31 pg (ref 26.0–34.0)
MCHC: 30.7 g/dL (ref 30.0–36.0)
MCV: 100.8 fL — ABNORMAL HIGH (ref 80.0–100.0)
Monocytes Absolute: 0.3 10*3/uL (ref 0.1–1.0)
Monocytes Relative: 10 %
Neutro Abs: 2.4 10*3/uL (ref 1.7–7.7)
Neutrophils Relative %: 75 %
Platelets: 307 10*3/uL (ref 150–400)
RBC: 2.39 MIL/uL — ABNORMAL LOW (ref 3.87–5.11)
RDW: 17.5 % — ABNORMAL HIGH (ref 11.5–15.5)
WBC: 3.2 10*3/uL — ABNORMAL LOW (ref 4.0–10.5)
nRBC: 1.9 % — ABNORMAL HIGH (ref 0.0–0.2)

## 2019-05-21 LAB — COMPREHENSIVE METABOLIC PANEL
ALT: 187 U/L — ABNORMAL HIGH (ref 0–44)
AST: 101 U/L — ABNORMAL HIGH (ref 15–41)
Albumin: 3.2 g/dL — ABNORMAL LOW (ref 3.5–5.0)
Alkaline Phosphatase: 159 U/L — ABNORMAL HIGH (ref 38–126)
Anion gap: 12 (ref 5–15)
BUN: 73 mg/dL — ABNORMAL HIGH (ref 8–23)
CO2: 17 mmol/L — ABNORMAL LOW (ref 22–32)
Calcium: 8.8 mg/dL — ABNORMAL LOW (ref 8.9–10.3)
Chloride: 107 mmol/L (ref 98–111)
Creatinine, Ser: 3.29 mg/dL — ABNORMAL HIGH (ref 0.44–1.00)
GFR calc Af Amer: 16 mL/min — ABNORMAL LOW (ref >60)
GFR calc non Af Amer: 13 mL/min — ABNORMAL LOW (ref >60)
Glucose, Bld: 153 mg/dL — ABNORMAL HIGH (ref 70–99)
Potassium: 4.1 mmol/L (ref 3.5–5.1)
Sodium: 136 mmol/L (ref 135–145)
Total Bilirubin: 0.6 mg/dL (ref 0.3–1.2)
Total Protein: 6.2 g/dL — ABNORMAL LOW (ref 6.5–8.1)

## 2019-05-21 LAB — MAGNESIUM: Magnesium: 1.9 mg/dL (ref 1.7–2.4)

## 2019-05-21 LAB — LIPASE, BLOOD: Lipase: 34 U/L (ref 11–51)

## 2019-05-21 LAB — ACETAMINOPHEN LEVEL: Acetaminophen (Tylenol), Serum: <10 ug/mL — ABNORMAL LOW (ref 10–30)

## 2019-05-21 LAB — GLUCOSE, CAPILLARY: Glucose-Capillary: 137 mg/dL — ABNORMAL HIGH (ref 70–99)

## 2019-05-21 LAB — SARS CORONAVIRUS 2 BY RT PCR (HOSPITAL ORDER, PERFORMED IN ~~LOC~~ HOSPITAL LAB): SARS Coronavirus 2: NEGATIVE

## 2019-05-21 MED ORDER — POLYSACCHARIDE IRON COMPLEX 150 MG PO CAPS
150.0000 mg | ORAL_CAPSULE | Freq: Two times a day (BID) | ORAL | Status: DC
  Administered 2019-05-21 – 2019-05-31 (×20): 150 mg via ORAL
  Filled 2019-05-21 (×21): qty 1

## 2019-05-21 MED ORDER — ONDANSETRON HCL 4 MG PO TABS: 4.0000 mg | ORAL_TABLET | Freq: Four times a day (QID) | ORAL | Status: DC | PRN

## 2019-05-21 MED ORDER — ONDANSETRON HCL 4 MG/2ML IJ SOLN: 4.0000 mg | Freq: Four times a day (QID) | INTRAMUSCULAR | Status: DC | PRN

## 2019-05-21 MED ORDER — NAPHAZOLINE-GLYCERIN 0.012-0.2 % OP SOLN
1.0000 [drp] | Freq: Two times a day (BID) | OPHTHALMIC | Status: DC | PRN
  Filled 2019-05-21: qty 15

## 2019-05-21 MED ORDER — OMEPRAZOLE 20 MG PO CPDR
20.0000 mg | DELAYED_RELEASE_CAPSULE | Freq: Every day | ORAL | Status: DC
  Administered 2019-05-22 – 2019-05-30 (×9): 20 mg via ORAL
  Filled 2019-05-21 (×11): qty 1

## 2019-05-21 MED ORDER — ASPIRIN 81 MG PO CHEW
81.0000 mg | CHEWABLE_TABLET | Freq: Every day | ORAL | Status: DC
  Administered 2019-05-22 – 2019-05-31 (×10): 81 mg via ORAL
  Filled 2019-05-21 (×10): qty 1

## 2019-05-21 MED ORDER — ATORVASTATIN CALCIUM 10 MG PO TABS
10.0000 mg | ORAL_TABLET | Freq: Every day | ORAL | Status: DC
  Administered 2019-05-21 – 2019-05-30 (×10): 10 mg via ORAL
  Filled 2019-05-21 (×11): qty 1

## 2019-05-21 MED ORDER — OMEPRAZOLE MAGNESIUM 20 MG PO TBEC: 20.0000 mg | DELAYED_RELEASE_TABLET | Freq: Every day | ORAL | Status: DC

## 2019-05-21 MED ORDER — APIXABAN 5 MG PO TABS
5.0000 mg | ORAL_TABLET | Freq: Two times a day (BID) | ORAL | Status: DC
  Administered 2019-05-21 – 2019-05-28 (×14): 5 mg via ORAL
  Filled 2019-05-21 (×8): qty 1
  Filled 2019-05-21: qty 2
  Filled 2019-05-21 (×6): qty 1

## 2019-05-21 MED ORDER — LINAGLIPTIN 5 MG PO TABS
5.0000 mg | ORAL_TABLET | Freq: Every day | ORAL | Status: DC
  Administered 2019-05-22 – 2019-05-31 (×10): 5 mg via ORAL
  Filled 2019-05-21 (×10): qty 1

## 2019-05-21 MED ORDER — SODIUM ZIRCONIUM CYCLOSILICATE 5 G PO PACK
5.0000 g | PACK | Freq: Two times a day (BID) | ORAL | Status: DC
  Administered 2019-05-22: 5 g via ORAL
  Filled 2019-05-21 (×3): qty 1

## 2019-05-21 MED ORDER — LEVOTHYROXINE SODIUM 50 MCG PO TABS
50.0000 ug | ORAL_TABLET | Freq: Every day | ORAL | Status: DC
  Administered 2019-05-22 – 2019-05-31 (×9): 50 ug via ORAL
  Filled 2019-05-21 (×10): qty 1

## 2019-05-21 MED ORDER — ACETAMINOPHEN 650 MG RE SUPP: 650.0000 mg | Freq: Four times a day (QID) | RECTAL | Status: DC | PRN

## 2019-05-21 MED ORDER — ALBUTEROL SULFATE (2.5 MG/3ML) 0.083% IN NEBU
2.5000 mg | INHALATION_SOLUTION | Freq: Four times a day (QID) | RESPIRATORY_TRACT | Status: DC | PRN
  Administered 2019-05-21: 2.5 mg via RESPIRATORY_TRACT
  Filled 2019-05-21: qty 3

## 2019-05-21 MED ORDER — GABAPENTIN 300 MG PO CAPS
300.0000 mg | ORAL_CAPSULE | Freq: Two times a day (BID) | ORAL | Status: DC
  Administered 2019-05-21 – 2019-05-25 (×8): 300 mg via ORAL
  Filled 2019-05-21 (×8): qty 1

## 2019-05-21 MED ORDER — ACETAMINOPHEN 325 MG PO TABS
650.0000 mg | ORAL_TABLET | Freq: Four times a day (QID) | ORAL | Status: DC | PRN
  Administered 2019-05-27 – 2019-05-30 (×2): 650 mg via ORAL
  Filled 2019-05-21 (×3): qty 2

## 2019-05-21 MED ORDER — TRAMADOL HCL 50 MG PO TABS
50.0000 mg | ORAL_TABLET | Freq: Two times a day (BID) | ORAL | Status: DC | PRN
  Administered 2019-05-21 – 2019-05-30 (×8): 50 mg via ORAL
  Filled 2019-05-21 (×8): qty 1

## 2019-05-21 MED ORDER — FUROSEMIDE 10 MG/ML IJ SOLN
80.0000 mg | Freq: Once | INTRAMUSCULAR | Status: AC
  Administered 2019-05-21: 80 mg via INTRAVENOUS
  Filled 2019-05-21: qty 8

## 2019-05-21 MED ORDER — FUROSEMIDE 10 MG/ML IJ SOLN
60.0000 mg | Freq: Every day | INTRAMUSCULAR | Status: DC
  Administered 2019-05-22: 60 mg via INTRAVENOUS
  Filled 2019-05-21: qty 6

## 2019-05-21 NOTE — ED Notes (Signed)
Patients SPO2 was 82% RA. Patient was placed on 6L via nasal cannula. SPO2 currently 91%.

## 2019-05-21 NOTE — ED Triage Notes (Signed)
Patient here from home with complaints of weakness x3 day. Reports hx of renal failure, no dialysis. Walks with cane. Palliative care.

## 2019-05-21 NOTE — H&P (Signed)
History and Physical    Nicole Sellers CHE:[id number] DOB: 09-28-1950 DOA: 05/21/2019  PCP: Reynold Bowen, MD  Patient coming from: Home  I have personally briefly reviewed patient's old medical records in [place]  Chief Complaint: Generalized weakness  HPI: Nicole Sellers is a 69 y.o. female with medical history significant of stage 3 rectal CA s/p chemo, radiation, resection, no residual tumor and currently in remission.  HTN, DM2, hypothyroidism, PAD, DVT on eliquis, CKD stage 4-5.  Pt admitted with AKI in March, ultimately discharged with CKD stage 4-5.  Pt had been refusing dialysis previously though she now qualifies "if my options are dialysis or death, do dialysis".  Pt presents to ED with c/o generalized weakness for past 1 week.  Progressively worsening.  Increased orthopnea improved slightly with use of multiple pillows.  She states that today her family was concerned about her and called 911 for her to be evaluated.  She reports that she has had poor appetite and has not eaten in the past few days, last oral intake was 2 days ago when she had chicken soup.  She denies abdominal pain, significant nausea, or vomiting however just has no appetite.   ED Course: Pt found to be fluid overloaded: Pulm edema, small B pleural effusions.  Creat 3.2 same as discharge in March, BUN up to 73 though from 38 in march.  Bicarb 17, AST 101, ALT 187, no liver pathology mentioned on CT scan.  HGB of 7.4 is actually improved from 6.6 on discharge in March.   Review of Systems: As per HPI, otherwise all review of systems negative.  Past Medical History:  Diagnosis Date  . Age-related nuclear cataract, right eye   . Allergy   . Anemia   . Arthritis    "joints sometimes" (12/25/2012)  . Clotting disorder ([place])   . Colon cancer ([place])   . Critical lower limb ischemia   . Diverticulosis   . Gangrene of toe, Rt second toe 12/25/2012  . GERD (gastroesophageal reflux disease)    . High cholesterol   . Hypertension   . Hypothyroidism   . PAD (peripheral artery disease) ([place])   . PAD (peripheral artery disease) ([place])   . Peripheral neuropathy   . Rectal cancer ([place])   . S/P arterial stent, 12/25/13, successful diamondback orbital rotational arthrectomy, PTA using chocolate  balloon and stenting using I DEV stent of long segment calcified high-grade proximal and mid r 12/25/2012  . Tobacco abuse   . Type II diabetes mellitus ([place])     Past Surgical History:  Procedure Laterality Date  . ABDOMINAL AORTAGRAM  12/20/2012   Procedure: ABDOMINAL AORTAGRAM;  Surgeon: Lorretta Harp, MD;  Location: [facility] CATH LAB;  Service: Cardiovascular;;  . ABDOMINAL HYSTERECTOMY  2000  . ANGIOPLASTY / STENTING FEMORAL Right 12/25/2012  . ATHERECTOMY Right 12/25/2012   Procedure: ATHERECTOMY;  Surgeon: Lorretta Harp, MD;  Location: [facility] CATH LAB;  Service: Cardiovascular;  Laterality: Right;  right SFA  . COLONOSCOPY    . DIVERTING ILEOSTOMY N/A 08/03/2018   Procedure: DIVERTING LOOP ILEOSTOMY;  Surgeon: Leighton Ruff, MD;  Location: [facility] ORS;  Service: General;  Laterality: N/A;  . ESOPHAGOGASTRODUODENOSCOPY N/A 10/04/2018   Procedure: ESOPHAGOGASTRODUODENOSCOPY (EGD);  Surgeon: Carol Ada, MD;  Location: [facility] ENDOSCOPY;  Service: Endoscopy;  Laterality: N/A;  . EYE SURGERY Left    cataract  . HERNIA REPAIR    . LOWER EXTREMITY ANGIOGRAM N/A 12/20/2012   Procedure: LOWER EXTREMITY ANGIOGRAM;  Surgeon: Lorretta Harp, MD;  Location: [facility] CATH LAB;  Service: Cardiovascular;  Laterality: N/A;  . LOWER EXTREMITY ANGIOGRAM N/A 12/25/2012   Procedure: LOWER EXTREMITY ANGIOGRAM;  Surgeon: Lorretta Harp, MD;  Location: [facility] CATH LAB;  Service: Cardiovascular;  Laterality: N/A;  . LOWER EXTREMITY INTERVENTION  05/08/2017  . LOWER EXTREMITY INTERVENTION Bilateral 05/08/2017   Procedure: LOWER EXTREMITY INTERVENTION;  Surgeon: Lorretta Harp, MD;  Location: [place] CV LAB;  Service:  Cardiovascular;  Laterality: Bilateral;  . PERIPHERAL VASCULAR BALLOON ANGIOPLASTY Right 05/08/2017   Procedure: PERIPHERAL VASCULAR BALLOON ANGIOPLASTY;  Surgeon: Lorretta Harp, MD;  Location: [place] CV LAB;  Service: Cardiovascular;  Laterality: Right;  sfa  . PERIPHERAL VASCULAR INTERVENTION Right 05/08/2017   Procedure: PERIPHERAL VASCULAR INTERVENTION;  Surgeon: Lorretta Harp, MD;  Location: [place] CV LAB;  Service: Cardiovascular;  Laterality: Right;  ext iliac  . PORTACATH PLACEMENT Right 11/22/2017   Procedure: INSERTION PORT-A-CATH;  Surgeon: Leighton Ruff, MD;  Location: [facility] ORS;  Service: General;  Laterality: Right;  . SAVORY DILATION N/A 10/04/2018   Procedure: SAVORY DILATION;  Surgeon: Carol Ada, MD;  Location: [facility] ENDOSCOPY;  Service: Endoscopy;  Laterality: N/A;  . TOE SURGERY Right    2n toe   . UMBILICAL HERNIA REPAIR  2000  . UMBILICAL HERNIA REPAIR N/A 08/03/2018   Procedure: UMBILICAL HERNIA REPAIR;  Surgeon: Leighton Ruff, MD;  Location: [facility] ORS;  Service: General;  Laterality: N/A;  . XI ROBOTIC ASSISTED LOWER ANTERIOR RESECTION N/A 08/03/2018   Procedure: XI ROBOTIC ASSISTED LOWER ANTERIOR RESECTION; DIAGNOSTIC FLEXIBLE SIGMOIDOSCOPY; INTRAOPERATIVE ASSESSMENT OF VASCULAR PERFUSION;  Surgeon: Leighton Ruff, MD;  Location: [facility] ORS;  Service: General;  Laterality: N/A;     reports that she has quit smoking. Her smoking use included cigarettes. She has a 24.00 pack-year smoking history. She has never used smokeless tobacco. She reports that she does not drink alcohol or use drugs.  Allergies  Allergen Reactions  . Kiwi Extract Itching and Other (See Comments)    Throat itching   . Lisinopril Other (See Comments)    Bruising   . Plavix [Clopidogrel] Other (See Comments)    "Made me feel badly- didn't agree with my system"    Family History  Problem Relation Age of Onset  . Hypertension Mother   . Stroke Mother   . Cancer Father        prostate   . Mental illness Sister   . Diabetes Brother   . Hypertension Brother   . Cancer Brother 54       prostate  . Diabetes Brother   . Hypertension Brother   . Cancer Brother        "tumors on neck and body"      Prior to Admission medications   Medication Sig Start Date End Date Taking? Authorizing Provider  albuterol (PROVENTIL HFA;VENTOLIN HFA) 108 (90 BASE) MCG/ACT inhaler Inhale 2 puffs into the lungs every 6 (six) hours as needed for wheezing or shortness of breath.   Yes [provider]  apixaban (ELIQUIS) 5 MG TABS tablet Take 5 mg by mouth 2 (two) times daily.   Yes [provider]  aspirin 81 MG tablet Take 81 mg by mouth daily.   Yes [provider]  atorvastatin (LIPITOR) 10 MG tablet Take 1 tablet (10 mg total) by mouth daily. Patient taking differently: Take 10 mg by mouth at bedtime.  01/01/18  Yes Minette Brine, FNP  Calcium Carbonate-Vitamin D (  CALCIUM 600+D PO) Take 1 tablet by mouth daily.   Yes [provider]  cetirizine (ZYRTEC) 10 MG tablet Take 10 mg by mouth daily.   Yes [provider]  Cholecalciferol (VITAMIN D3) 50 MCG (2000 UT) TABS Take 4,000 Units by mouth daily.   Yes [provider]  FERREX 150 150 MG capsule Take 150 mg by mouth 2 (two) times daily. 08/29/17  Yes [provider]  furosemide (LASIX) 40 MG tablet Take 60 mg by mouth daily.  05/08/19  Yes [provider]  gabapentin (NEURONTIN) 300 MG capsule Take 300 mg by mouth 2 (two) times daily.    Yes [provider]  levothyroxine (SYNTHROID, LEVOTHROID) 50 MCG tablet Take 1 tablet (50 mcg total) by mouth daily. 01/01/18  Yes Minette Brine, FNP  Multiple Vitamins-Minerals (SENIOR MULTIVITAMIN PLUS PO) Take 1 tablet by mouth daily with breakfast.   Yes [provider]  omeprazole (PRILOSEC OTC) 20 MG tablet Take 20 mg by mouth daily before breakfast.   Yes [provider]  Psyllium (METAMUCIL FIBER PO) Take  1 Scoop by mouth See admin instructions. Mix 1 scoopful into 6-8 ounces of desired beverage and drink two times a day   Yes [provider]  sitaGLIPtin (JANUVIA) 25 MG tablet Take 1 tablet (25 mg total) by mouth daily. 04/04/19  Yes Donne Hazel, MD  sodium zirconium cyclosilicate (LOKELMA) 5 g packet Take 5 g by mouth 2 (two) times daily.   Yes [provider]  tetrahydrozoline-zinc (VISINE-AC) 0.05-0.25 % ophthalmic solution Place 1 drop into both eyes 2 (two) times daily as needed (dry eyes).   Yes [provider]  traMADol (ULTRAM) 50 MG tablet Take 1 tablet (50 mg total) by mouth every 12 (twelve) hours as needed for moderate pain. 04/04/19  Yes Donne Hazel, MD  Triamcinolone Acetonide (NASACORT ALLERGY 24HR NA) Place 1 spray into both nostrils daily as needed (allergies).    Yes [provider]  Trolamine Salicylate (ASPERCREME EX) Apply 1 application topically daily as needed (to affected areas, for joint pain).    Yes [provider]  vitamin B-12 (CYANOCOBALAMIN) 500 MCG tablet Take 500 mcg by mouth daily.   Yes [provider]  apixaban (ELIQUIS) 5 MG TABS tablet Take 1 tablet (5 mg total) by mouth 2 (two) times daily. 04/04/19 05/04/19  Donne Hazel, MD    Physical Exam: Vitals:   05/21/19 1800 05/21/19 1900 05/21/19 2000 05/21/19 2030  BP: 125/81 128/74 131/77 123/72  Pulse:  84 87 86  Resp:  16 17 (!) 24  Temp:      TempSrc:      SpO2:  94% 98% 91%    Constitutional: NAD, calm, comfortable Eyes: PERRL, lids and conjunctivae normal ENMT: Mucous membranes are moist. Posterior pharynx clear of any exudate or lesions.Normal dentition.  Neck: normal, supple, no masses, no thyromegaly Respiratory: clear to auscultation bilaterally, no wheezing, no crackles. Normal respiratory effort. No accessory muscle use.  Cardiovascular: Regular rate and rhythm, no murmurs / rubs / gallops. 3+ BLE edema. 2+ pedal pulses. No carotid  bruits.  Abdomen: no tenderness, no masses palpated. No hepatosplenomegaly. Bowel sounds positive. Ostomy in place Musculoskeletal: no clubbing / cyanosis. No joint deformity upper and lower extremities. Good ROM, no contractures. Normal muscle tone.  Skin: no rashes, lesions, ulcers. No induration Neurologic: CN 2-12 grossly intact. Sensation intact, DTR normal. Strength 5/5 in all 4.  Psychiatric: Normal judgment and insight. Alert  and oriented x 3. Normal mood.    Labs on Admission: I have personally reviewed following labs and imaging studies  CBC: Recent Labs  Lab 05/21/19 1604  WBC 3.2*  NEUTROABS 2.4  HGB 7.4*  HCT 24.1*  MCV 100.8*  PLT 945   Basic Metabolic Panel: Recent Labs  Lab 05/21/19 1604  NA 136  K 4.1  CL 107  CO2 17*  GLUCOSE 153*  BUN 73*  CREATININE 3.29*  CALCIUM 8.8*   GFR: CrCl cannot be calculated (Unknown ideal weight.). Liver Function Tests: Recent Labs  Lab 05/21/19 1604  AST 101*  ALT 187*  ALKPHOS 159*  BILITOT 0.6  PROT 6.2*  ALBUMIN 3.2*   Recent Labs  Lab 05/21/19 1604  LIPASE 34   No results for input(s): AMMONIA in the last 168 hours. Coagulation Profile: No results for input(s): INR, PROTIME in the last 168 hours. Cardiac Enzymes: No results for input(s): CKTOTAL, CKMB, CKMBINDEX, TROPONINI in the last 168 hours. BNP (last 3 results) No results for input(s): PROBNP in the last 8760 hours. HbA1C: No results for input(s): HGBA1C in the last 72 hours. CBG: No results for input(s): GLUCAP in the last 168 hours. Lipid Profile: No results for input(s): CHOL, HDL, LDLCALC, TRIG, CHOLHDL, LDLDIRECT in the last 72 hours. Thyroid Function Tests: No results for input(s): TSH, T4TOTAL, FREET4, T3FREE, THYROIDAB in the last 72 hours. Anemia Panel: No results for input(s): VITAMINB12, FOLATE, FERRITIN, TIBC, IRON, RETICCTPCT in the last 72 hours. Urine analysis:    Component Value Date/Time   COLORURINE YELLOW 03/30/2019  0759   APPEARANCEUR HAZY (A) 03/30/2019 0759   LABSPEC 1.011 03/30/2019 0759   PHURINE 5.0 03/30/2019 0759   GLUCOSEU NEGATIVE 03/30/2019 0759   HGBUR NEGATIVE 03/30/2019 0759   BILIRUBINUR NEGATIVE 03/30/2019 0759   BILIRUBINUR negative 11/27/2014 [place] 03/30/2019 0759   PROTEINUR NEGATIVE 03/30/2019 0759   UROBILINOGEN 0.2 11/27/2014 1052   NITRITE NEGATIVE 03/30/2019 0759   LEUKOCYTESUR MODERATE (A) 03/30/2019 0759    Radiological Exams on Admission: CT ABDOMEN PELVIS WO CONTRAST  Result Date: 05/21/2019 CLINICAL DATA:  Pleural effusions, jaundice and weakness for 3 days. EXAM: CT CHEST, ABDOMEN AND PELVIS WITHOUT CONTRAST TECHNIQUE: Multidetector CT imaging of the chest, abdomen and pelvis was performed following the standard protocol without IV contrast. COMPARISON:  CT abdomen and pelvis 03/30/2019. FINDINGS: CT CHEST FINDINGS Cardiovascular: Cardiomegaly. Calcific aortic and coronary atherosclerosis. No pericardial effusion. No aortic aneurysm. Mediastinum/Nodes: No enlarged mediastinal, hilar, or axillary lymph nodes. Thyroid gland, trachea, and esophagus demonstrate no significant findings. Lungs/Pleura: Small bilateral pleural effusions. There is interlobular septal thickening and scattered foci of mild ground-glass attenuation. No consolidative process, nodule or mass. Mild compressive atelectasis in the bases noted. Musculoskeletal: No acute bony abnormality. CT ABDOMEN PELVIS FINDINGS Hepatobiliary: No focal liver abnormality is seen. No gallstones, gallbladder wall thickening, or biliary dilatation. Pancreas: Unremarkable. No pancreatic ductal dilatation or surrounding inflammatory changes. Spleen: Normal in size without focal abnormality. Adrenals/Urinary Tract: The right kidney appears normal. 0.6 cm calcification upper pole left kidney is compatible with a nonobstructing stone. Ureters and urinary bladder appear normal. Stomach/Bowel: The patient is status post  low anterior resection. 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 There is some sigmoid diverticulosis. The appendix appears normal. No evidence of bowel obstruction. Stomach is unremarkable Vascular/Lymphatic: Aortic atherosclerosis. No enlarged abdominal or pelvic lymph nodes. Reproductive: Status post hysterectomy. No adnexal masses. Other: None. Musculoskeletal: No acute or focal abnormality. IMPRESSION: Interlobular septal thickening in  the chest consistent with pulmonary edema. Associated small bilateral pleural effusions and cardiomegaly noted. No acute abnormality abdomen or pelvis. Status post low anterior resection with an ileostomy in place. Mild diverticulosis without diverticulitis noted. Aortic Atherosclerosis (ICD10-I70.0). Calcific coronary artery disease is also seen. Electronically Signed   By: Inge Rise M.D.   On: 05/21/2019 18:59   DG Chest 2 View  Result Date: 05/21/2019 CLINICAL DATA:  Weakness x3 days. EXAM: CHEST - 2 VIEW COMPARISON:  March 30, 2019 FINDINGS: There is stable right-sided venous Port-A-Cath positioning. Mild to moderate severity diffusely increased lung markings are seen which is increased in severity when compared to the prior study. Mild areas of atelectasis and/or infiltrate are seen within the bilateral lung bases. There are small bilateral pleural effusions. No pneumothorax is identified. The cardiac silhouette is markedly enlarged. This is increased in size when compared to the prior study. The visualized skeletal structures are unremarkable. IMPRESSION: 1. Findings consistent with mild to moderate congestive heart failure. 2. Mild bibasilar atelectasis and/or infiltrate. 3. Small bilateral pleural effusions. 4. Interval enlargement of the cardiac silhouette since the prior chest plain film dated March 30, 2019. The presence of a pericardial effusion cannot be excluded. Electronically Signed   By: Virgina Norfolk M.D.   On: 05/21/2019 18:16   CT Chest Wo Contrast  Result  Date: 05/21/2019 CLINICAL DATA:  Pleural effusions, jaundice and weakness for 3 days. EXAM: CT CHEST, ABDOMEN AND PELVIS WITHOUT CONTRAST TECHNIQUE: Multidetector CT imaging of the chest, abdomen and pelvis was performed following the standard protocol without IV contrast. COMPARISON:  CT abdomen and pelvis 03/30/2019. FINDINGS: CT CHEST FINDINGS Cardiovascular: Cardiomegaly. Calcific aortic and coronary atherosclerosis. No pericardial effusion. No aortic aneurysm. Mediastinum/Nodes: No enlarged mediastinal, hilar, or axillary lymph nodes. Thyroid gland, trachea, and esophagus demonstrate no significant findings. Lungs/Pleura: Small bilateral pleural effusions. There is interlobular septal thickening and scattered foci of mild ground-glass attenuation. No consolidative process, nodule or mass. Mild compressive atelectasis in the bases noted. Musculoskeletal: No acute bony abnormality. CT ABDOMEN PELVIS FINDINGS Hepatobiliary: No focal liver abnormality is seen. No gallstones, gallbladder wall thickening, or biliary dilatation. Pancreas: Unremarkable. No pancreatic ductal dilatation or surrounding inflammatory changes. Spleen: Normal in size without focal abnormality. Adrenals/Urinary Tract: The right kidney appears normal. 0.6 cm calcification upper pole left kidney is compatible with a nonobstructing stone. Ureters and urinary bladder appear normal. Stomach/Bowel: The patient is status post low anterior resection. 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 There is some sigmoid diverticulosis. The appendix appears normal. No evidence of bowel obstruction. Stomach is unremarkable Vascular/Lymphatic: Aortic atherosclerosis. No enlarged abdominal or pelvic lymph nodes. Reproductive: Status post hysterectomy. No adnexal masses. Other: None. Musculoskeletal: No acute or focal abnormality. IMPRESSION: Interlobular septal thickening in the chest consistent with pulmonary edema. Associated small bilateral pleural effusions and cardiomegaly  noted. No acute abnormality abdomen or pelvis. Status post low anterior resection with an ileostomy in place. Mild diverticulosis without diverticulitis noted. Aortic Atherosclerosis (ICD10-I70.0). Calcific coronary artery disease is also seen. Electronically Signed   By: Inge Rise M.D.   On: 05/21/2019 18:59    EKG: Independently reviewed.  Assessment/Plan Principal Problem:   New onset of congestive heart failure (HCC) Active Problems:   Non-insulin treated type 2 diabetes mellitus (HCC)   Rectal cancer (HCC)   DVT (deep venous thrombosis) (HCC)   CKD (chronic kidney disease) stage 4, GFR 15-29 ml/min (HCC)   Fluid overload   Anemia of chronic disease   Transaminitis  1. New onset CHF / fluid overload - 1. Suspect fluid overload due to kidney function as main driver. 2. Repeat 2d echo, but had 50-55% EF and grade 1 diastolic dysfunction in March 3. Lasix '80mg'$  IV x1 now then '60mg'$  IV daily 4. Fluid restriction with diet 5. Repeat CMP in AM 2. CKD stage 4-5 1. Nephrology consult in AM 2. Pt now states she DOES want dialysis if the option is that or death. 3. Keep close eye on kidney function while doing diuresis. 4. Continue BID Lokelma 5. Keep eye on Bicarb, may or may not need to start PO bicarb ultimately for NAG met acidosis 3. Anemia of chronic dz - 1. Cont PO iron 2. Nephro consult as above 3. HGB 7.4 actually improved from 6.6 on discharge in March 4. Repeat CBC in AM 4. Transaminitis - 1. Suspect congestion from fluid overload / CHF 2. Repeat CMP in AM 5. H/o DVT - 1. Cont eliquis 6. Rectal CA - 1. Remission at this time 7. DM2 - 1. Cont Januvia  DVT prophylaxis: Eliquis Code Status: DNR Family Communication: Family at bedside Disposition Plan: Home after diuresis and renal function remains stable Consults called: None, call nephro in AM Admission status: Admit to inpatient  Severity of Illness: The appropriate patient status for this patient is  INPATIENT. Inpatient status is judged to be reasonable and necessary in order to provide the required intensity of service to ensure the patient's safety. The patient's presenting symptoms, physical exam findings, and initial radiographic and laboratory data in the context of their chronic comorbidities is felt to place them at high risk for further clinical deterioration. Furthermore, it is not anticipated that the patient will be medically stable for discharge from the hospital within 2 midnights of admission. The following factors support the patient status of inpatient.   IP status due to new onset CHF, complicated by CKD stage 4-5 at baseline, needs inpatient monitoring during diuresis.   * I certify that at the point of admission it is my clinical judgment that the patient will require inpatient hospital care spanning beyond 2 midnights from the point of admission due to high intensity of service, high risk for further deterioration and high frequency of surveillance required.*    Hilery Wintle M. DO [facility]  How to contact the [facility] Attending or Consulting provider [place] or covering provider during after hours [place], for this patient?  1. Check the care team in [facility] and look for a) attending/consulting [facility] provider listed and b) the [facility] team listed 2. Log into [email or web address]  Amion Physician Scheduling and messaging for groups and whole hospitals  On call and physician scheduling software for group practices, residents, hospitalists and other medical providers for call, clinic, rotation and shift schedules. On[employer] is a hospital-wide system for scheduling doctors and paging doctors on call. EasyPlot is for scientific plotting and data analysis.  [email or web address]  and use [place]'s universal password to access. If you do not have the password, please contact the hospital operator.  3. Locate the [facility] provider you are looking for under [facility] and page to a  number that you can be directly reached. 4. If you still have difficulty reaching the provider, please page the [facility] (Director on Call) for the Hospitalists listed on amion for assistance.  05/21/2019, 9:06 PM

## 2019-05-21 NOTE — ED Provider Notes (Signed)
[place] DEPT Provider Note   CSN: [id number] Arrival date & time: 05/21/19  1458     History Chief Complaint  Patient presents with  . Weakness    Nicole Sellers is a 69 y.o. female with a past medical history of colon cancer/rectal cancer status post neoadjuvant chemo, radiation, lower anterior resection which showed no residual tumor and negative lymph nodes, hypertension, hypothyroid, DM 2, PAD, DVT anticoagulated with Eliquis, renal failure having refused dialysis who presents today for evaluation of feeling generally weak and unwell.  She reports that over the past week she has been feeling more and more weak.  She states that today her family was concerned about her and called 911 for her to be evaluated.  She reports that she has had poor appetite and has not eaten in the past few days, last oral intake was 2 days ago when she had chicken soup.  She denies abdominal pain, significant nausea, or vomiting however just has no appetite.    HPI     Past Medical History:  Diagnosis Date  . Age-related nuclear cataract, right eye   . Allergy   . Anemia   . Arthritis    "joints sometimes" (12/25/2012)  . Clotting disorder ([place])   . Colon cancer ([place])   . Critical lower limb ischemia   . Diverticulosis   . Gangrene of toe, Rt second toe 12/25/2012  . GERD (gastroesophageal reflux disease)   . High cholesterol   . Hypertension   . Hypothyroidism   . PAD (peripheral artery disease) ([place])   . PAD (peripheral artery disease) ([place])   . Peripheral neuropathy   . Rectal cancer ([place])   . S/P arterial stent, 12/25/13, successful diamondback orbital rotational arthrectomy, PTA using chocolate  balloon and stenting using I DEV stent of long segment calcified high-grade proximal and mid r 12/25/2012  . Tobacco abuse   . Type II diabetes mellitus [facility])     Patient Active Problem List   Diagnosis Date Noted  . CKD (chronic kidney disease) stage 4,  GFR 15-29 ml/min (HCC) 05/21/2019  . Fluid overload 05/21/2019  . New onset of congestive heart failure ([place]) 05/21/2019  . Anemia of chronic disease 05/21/2019  . Transaminitis 05/21/2019  . Acute kidney injury ([place]) 03/29/2019  . DVT (deep venous thrombosis) ([place]) 03/29/2019  . Oral candidiasis   . AKI (acute kidney injury) ([place]) 10/01/2018  . Hypokalemia 10/01/2018  . Pyuria 10/01/2018  . Odynophagia 10/01/2018  . Hyponatremia 10/01/2018  . Right sided weakness 10/01/2018  . SBO (small bowel obstruction) ([place]) 08/22/2018  . Dehydration 08/21/2018  . Pre-operative cardiovascular examination 06/07/2018  . Rectal cancer ([place]) 11/06/2017  . Claudication in peripheral vascular disease ([place]) 05/08/2017  . Carotid artery disease ([place]) 04/14/2017  . PAD (peripheral artery disease) Rt ABI 0.7, Lt ABI 0.46 12/25/2012  . S/P arterial stent, 12/25/13, successful diamondback orbital rotational arthrectomy, PTA using chocolate  balloon and stenting using I DEV stent of long segment calcified high-grade proximal and mid r 12/25/2012  . Gangrene of toe, Rt second toe 12/25/2012  . Essential hypertension 12/13/2012  . Non-insulin treated type 2 diabetes mellitus ([place]) 12/13/2012  . Critical lower limb ischemia 12/13/2012  . Tobacco abuse 12/13/2012    Past Surgical History:  Procedure Laterality Date  . ABDOMINAL AORTAGRAM  12/20/2012   Procedure: ABDOMINAL AORTAGRAM;  Surgeon: Lorretta Harp, MD;  Location: [facility] CATH LAB;  Service: Cardiovascular;;  . ABDOMINAL HYSTERECTOMY  2000  .  ANGIOPLASTY / STENTING FEMORAL Right 12/25/2012  . ATHERECTOMY Right 12/25/2012   Procedure: ATHERECTOMY;  Surgeon: Lorretta Harp, MD;  Location: [facility] CATH LAB;  Service: Cardiovascular;  Laterality: Right;  right SFA  . COLONOSCOPY    . DIVERTING ILEOSTOMY N/A 08/03/2018   Procedure: DIVERTING LOOP ILEOSTOMY;  Surgeon: Leighton Ruff, MD;  Location: [facility] ORS;  Service: General;  Laterality: N/A;  .  ESOPHAGOGASTRODUODENOSCOPY N/A 10/04/2018   Procedure: ESOPHAGOGASTRODUODENOSCOPY (EGD);  Surgeon: Carol Ada, MD;  Location: [facility] ENDOSCOPY;  Service: Endoscopy;  Laterality: N/A;  . EYE SURGERY Left    cataract  . HERNIA REPAIR    . LOWER EXTREMITY ANGIOGRAM N/A 12/20/2012   Procedure: LOWER EXTREMITY ANGIOGRAM;  Surgeon: Lorretta Harp, MD;  Location: [facility] CATH LAB;  Service: Cardiovascular;  Laterality: N/A;  . LOWER EXTREMITY ANGIOGRAM N/A 12/25/2012   Procedure: LOWER EXTREMITY ANGIOGRAM;  Surgeon: Lorretta Harp, MD;  Location: [facility] CATH LAB;  Service: Cardiovascular;  Laterality: N/A;  . LOWER EXTREMITY INTERVENTION  05/08/2017  . LOWER EXTREMITY INTERVENTION Bilateral 05/08/2017   Procedure: LOWER EXTREMITY INTERVENTION;  Surgeon: Lorretta Harp, MD;  Location: [place] CV LAB;  Service: Cardiovascular;  Laterality: Bilateral;  . PERIPHERAL VASCULAR BALLOON ANGIOPLASTY Right 05/08/2017   Procedure: PERIPHERAL VASCULAR BALLOON ANGIOPLASTY;  Surgeon: Lorretta Harp, MD;  Location: [place] CV LAB;  Service: Cardiovascular;  Laterality: Right;  sfa  . PERIPHERAL VASCULAR INTERVENTION Right 05/08/2017   Procedure: PERIPHERAL VASCULAR INTERVENTION;  Surgeon: Lorretta Harp, MD;  Location: [place] CV LAB;  Service: Cardiovascular;  Laterality: Right;  ext iliac  . PORTACATH PLACEMENT Right 11/22/2017   Procedure: INSERTION PORT-A-CATH;  Surgeon: Leighton Ruff, MD;  Location: [facility] ORS;  Service: General;  Laterality: Right;  . SAVORY DILATION N/A 10/04/2018   Procedure: SAVORY DILATION;  Surgeon: Carol Ada, MD;  Location: [facility] ENDOSCOPY;  Service: Endoscopy;  Laterality: N/A;  . TOE SURGERY Right    2n toe   . UMBILICAL HERNIA REPAIR  2000  . UMBILICAL HERNIA REPAIR N/A 08/03/2018   Procedure: UMBILICAL HERNIA REPAIR;  Surgeon: Leighton Ruff, MD;  Location: [facility] ORS;  Service: General;  Laterality: N/A;  . XI ROBOTIC ASSISTED LOWER ANTERIOR RESECTION N/A 08/03/2018    Procedure: XI ROBOTIC ASSISTED LOWER ANTERIOR RESECTION; DIAGNOSTIC FLEXIBLE SIGMOIDOSCOPY; INTRAOPERATIVE ASSESSMENT OF VASCULAR PERFUSION;  Surgeon: Leighton Ruff, MD;  Location: [facility] ORS;  Service: General;  Laterality: N/A;     OB History   No obstetric history on file.     Family History  Problem Relation Age of Onset  . Hypertension Mother   . Stroke Mother   . Cancer Father        prostate  . Mental illness Sister   . Diabetes Brother   . Hypertension Brother   . Cancer Brother 23       prostate  . Diabetes Brother   . Hypertension Brother   . Cancer Brother        "tumors on neck and body"     Social History   Tobacco Use  . Smoking status: Former Smoker    Packs/day: 0.50    Years: 48.00    Pack years: 24.00    Types: Cigarettes  . Smokeless tobacco: Never Used  . Tobacco comment: quit in 2016  Substance Use Topics  . Alcohol use: No    Alcohol/week: 0.0 standard drinks  . Drug use: No    Home Medications Prior to Admission medications   Medication  Sig Start Date End Date Taking? Authorizing Provider  albuterol (PROVENTIL HFA;VENTOLIN HFA) 108 (90 BASE) MCG/ACT inhaler Inhale 2 puffs into the lungs every 6 (six) hours as needed for wheezing or shortness of breath.   Yes [provider]  apixaban (ELIQUIS) 5 MG TABS tablet Take 5 mg by mouth 2 (two) times daily.   Yes [provider]  aspirin 81 MG tablet Take 81 mg by mouth daily.   Yes [provider]  atorvastatin (LIPITOR) 10 MG tablet Take 1 tablet (10 mg total) by mouth daily. Patient taking differently: Take 10 mg by mouth at bedtime.  01/01/18  Yes Minette Brine, FNP  Calcium Carbonate-Vitamin D (CALCIUM 600+D PO) Take 1 tablet by mouth daily.   Yes [provider]  cetirizine (ZYRTEC) 10 MG tablet Take 10 mg by mouth daily.   Yes [provider]  Cholecalciferol (VITAMIN D3) 50 MCG (2000 UT) TABS Take 4,000 Units by mouth daily.   Yes [provider]  FERREX 150 150 MG capsule Take 150 mg by mouth 2 (two) times daily. 08/29/17  Yes [provider]  furosemide (LASIX) 40 MG tablet Take 60 mg by mouth daily.  05/08/19  Yes [provider]  gabapentin (NEURONTIN) 300 MG capsule Take 300 mg by mouth 2 (two) times daily.    Yes [provider]  levothyroxine (SYNTHROID, LEVOTHROID) 50 MCG tablet Take 1 tablet (50 mcg total) by mouth daily. 01/01/18  Yes Minette Brine, FNP  Multiple Vitamins-Minerals (SENIOR MULTIVITAMIN PLUS PO) Take 1 tablet by mouth daily with breakfast.   Yes [provider]  omeprazole (PRILOSEC OTC) 20 MG tablet Take 20 mg by mouth daily before breakfast.   Yes [provider]  Psyllium (METAMUCIL FIBER PO) Take 1 Scoop by mouth See admin instructions. Mix 1 scoopful into 6-8 ounces of desired beverage and drink two times a day   Yes [provider]  sitaGLIPtin (JANUVIA) 25 MG tablet Take 1 tablet (25 mg total) by mouth daily. 04/04/19  Yes Donne Hazel, MD  sodium zirconium cyclosilicate (LOKELMA) 5 g packet Take 5 g by mouth 2 (two) times daily.   Yes [provider]  tetrahydrozoline-zinc (VISINE-AC) 0.05-0.25 % ophthalmic solution Place 1 drop into both eyes 2 (two) times daily as needed (dry eyes).   Yes [provider]  traMADol (ULTRAM) 50 MG tablet Take 1 tablet (50 mg total) by mouth every 12 (twelve) hours as needed for moderate pain. 04/04/19  Yes Donne Hazel, MD  Triamcinolone Acetonide (NASACORT ALLERGY 24HR NA) Place 1 spray into both nostrils daily as needed (allergies).    Yes [provider]  Trolamine Salicylate (ASPERCREME EX) Apply 1 application topically daily as needed (to affected areas, for joint pain).    Yes [provider]  vitamin B-12 (CYANOCOBALAMIN) 500 MCG tablet Take 500 mcg by mouth daily.   Yes [provider]  apixaban (ELIQUIS) 5 MG TABS tablet Take 1 tablet (5 mg total) by mouth 2 (two)  times daily. 04/04/19 05/04/19  Donne Hazel, MD    Allergies    Kiwi extract, Lisinopril, and Plavix [clopidogrel]  Review of Systems   Review of Systems  Constitutional: Positive for appetite change and fatigue. Negative for chills and fever.  Respiratory: Positive for shortness of breath.   Cardiovascular: Positive for leg swelling (Unchanged).  Gastrointestinal: Positive for nausea and vomiting.       No change in ostomy output  Genitourinary: Negative  for dysuria.  Neurological: Positive for weakness (Generalized). Negative for headaches.    Physical Exam Updated Vital Signs BP 123/72 (BP Location: Right Arm)   Pulse 86   Temp 98.2 F (36.8 C) (Oral)   Resp (!) 24   SpO2 91%   Physical Exam Vitals and nursing note reviewed.  Constitutional:      Appearance: She is well-developed. She is ill-appearing (Chronically). She is not diaphoretic.  HENT:     Head: Normocephalic and atraumatic.  Eyes:     General: No scleral icterus.       Right eye: No discharge.        Left eye: No discharge.     Conjunctiva/sclera: Conjunctivae normal.  Cardiovascular:     Rate and Rhythm: Normal rate and regular rhythm.     Pulses: Normal pulses.     Heart sounds: Normal heart sounds.  Pulmonary:     Effort: Pulmonary effort is normal. No respiratory distress.     Breath sounds: No stridor. Rales present.  Abdominal:     General: There is no distension.     Tenderness: There is no abdominal tenderness.     Comments: Ostomy in place  Musculoskeletal:        General: No deformity.     Cervical back: Normal range of motion and neck supple.     Right lower leg: Edema present.     Left lower leg: Edema present.     Comments: 3+ pitting edema bilaterally  Skin:    General: Skin is warm and dry.  Neurological:     Mental Status: She is alert.     Motor: No abnormal muscle tone.  Psychiatric:        Mood and Affect: Mood normal.        Behavior: Behavior normal.     ED Results  / Procedures / Treatments   Labs (all labs ordered are listed, but only abnormal results are displayed) Labs Reviewed  CBC WITH DIFFERENTIAL/PLATELET - Abnormal; Notable for the following components:      Result Value   WBC 3.2 (*)    RBC 2.39 (*)    Hemoglobin 7.4 (*)    HCT 24.1 (*)    MCV 100.8 (*)    RDW 17.5 (*)    nRBC 1.9 (*)    Lymphs Abs 0.3 (*)    All other components within normal limits  COMPREHENSIVE METABOLIC PANEL - Abnormal; Notable for the following components:   CO2 17 (*)    Glucose, Bld 153 (*)    BUN 73 (*)    Creatinine, Ser 3.29 (*)    Calcium 8.8 (*)    Total Protein 6.2 (*)    Albumin 3.2 (*)    AST 101 (*)    ALT 187 (*)    Alkaline Phosphatase 159 (*)    GFR calc non Af Amer 13 (*)    GFR calc Af Amer 16 (*)    All other components within normal limits  ACETAMINOPHEN LEVEL - Abnormal; Notable for the following components:   Acetaminophen (Tylenol), Serum <10 (*)    All other components within normal limits  SARS CORONAVIRUS 2 BY RT PCR (HOSPITAL ORDER, [place] [facility])  LIPASE, BLOOD  URINALYSIS, ROUTINE W REFLEX MICROSCOPIC  HEPATITIS PANEL, ACUTE  COMPREHENSIVE METABOLIC PANEL  MAGNESIUM    EKG EKG Interpretation  Date/Time:  Tuesday May 21 2019 19:40:34 EDT Ventricular Rate:  86 PR Interval:  QRS Duration: 146 QT Interval:  427 QTC Calculation: 511 R Axis:   110 Text Interpretation: Sinus rhythm Ventricular premature complex Nonspecific intraventricular conduction delay Probable lateral infarct, age indeterminate Anterior infarct, old No significant change since prior 3/21 Confirmed by Aletta Edouard [phone]) on 05/21/2019 7:47:51 PM   Radiology CT ABDOMEN PELVIS WO CONTRAST  Result Date: 05/21/2019 CLINICAL DATA:  Pleural effusions, jaundice and weakness for 3 days. EXAM: CT CHEST, ABDOMEN AND PELVIS WITHOUT CONTRAST TECHNIQUE: Multidetector CT imaging of the chest, abdomen and pelvis was performed following  the standard protocol without IV contrast. COMPARISON:  CT abdomen and pelvis 03/30/2019. FINDINGS: CT CHEST FINDINGS Cardiovascular: Cardiomegaly. Calcific aortic and coronary atherosclerosis. No pericardial effusion. No aortic aneurysm. Mediastinum/Nodes: No enlarged mediastinal, hilar, or axillary lymph nodes. Thyroid gland, trachea, and esophagus demonstrate no significant findings. Lungs/Pleura: Small bilateral pleural effusions. There is interlobular septal thickening and scattered foci of mild ground-glass attenuation. No consolidative process, nodule or mass. Mild compressive atelectasis in the bases noted. Musculoskeletal: No acute bony abnormality. CT ABDOMEN PELVIS FINDINGS Hepatobiliary: No focal liver abnormality is seen. No gallstones, gallbladder wall thickening, or biliary dilatation. Pancreas: Unremarkable. No pancreatic ductal dilatation or surrounding inflammatory changes. Spleen: Normal in size without focal abnormality. Adrenals/Urinary Tract: The right kidney appears normal. 0.6 cm calcification upper pole left kidney is compatible with a nonobstructing stone. Ureters and urinary bladder appear normal. Stomach/Bowel: The patient is status post low anterior resection. 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 There is some sigmoid diverticulosis. The appendix appears normal. No evidence of bowel obstruction. Stomach is unremarkable Vascular/Lymphatic: Aortic atherosclerosis. No enlarged abdominal or pelvic lymph nodes. Reproductive: Status post hysterectomy. No adnexal masses. Other: None. Musculoskeletal: No acute or focal abnormality. IMPRESSION: Interlobular septal thickening in the chest consistent with pulmonary edema. Associated small bilateral pleural effusions and cardiomegaly noted. No acute abnormality abdomen or pelvis. Status post low anterior resection with an ileostomy in place. Mild diverticulosis without diverticulitis noted. Aortic Atherosclerosis (ICD10-I70.0). Calcific coronary artery disease is  also seen. Electronically Signed   By: Inge Rise M.D.   On: 05/21/2019 18:59   DG Chest 2 View  Result Date: 05/21/2019 CLINICAL DATA:  Weakness x3 days. EXAM: CHEST - 2 VIEW COMPARISON:  March 30, 2019 FINDINGS: There is stable right-sided venous Port-A-Cath positioning. Mild to moderate severity diffusely increased lung markings are seen which is increased in severity when compared to the prior study. Mild areas of atelectasis and/or infiltrate are seen within the bilateral lung bases. There are small bilateral pleural effusions. No pneumothorax is identified. The cardiac silhouette is markedly enlarged. This is increased in size when compared to the prior study. The visualized skeletal structures are unremarkable. IMPRESSION: 1. Findings consistent with mild to moderate congestive heart failure. 2. Mild bibasilar atelectasis and/or infiltrate. 3. Small bilateral pleural effusions. 4. Interval enlargement of the cardiac silhouette since the prior chest plain film dated March 30, 2019. The presence of a pericardial effusion cannot be excluded. Electronically Signed   By: Virgina Norfolk M.D.   On: 05/21/2019 18:16   CT Chest Wo Contrast  Result Date: 05/21/2019 CLINICAL DATA:  Pleural effusions, jaundice and weakness for 3 days. EXAM: CT CHEST, ABDOMEN AND PELVIS WITHOUT CONTRAST TECHNIQUE: Multidetector CT imaging of the chest, abdomen and pelvis was performed following the standard protocol without IV contrast. COMPARISON:  CT abdomen and pelvis 03/30/2019. FINDINGS: CT CHEST FINDINGS Cardiovascular: Cardiomegaly. Calcific aortic and coronary atherosclerosis. No pericardial effusion. No aortic aneurysm. Mediastinum/Nodes: No enlarged mediastinal, hilar, or  axillary lymph nodes. Thyroid gland, trachea, and esophagus demonstrate no significant findings. Lungs/Pleura: Small bilateral pleural effusions. There is interlobular septal thickening and scattered foci of mild ground-glass attenuation. No  consolidative process, nodule or mass. Mild compressive atelectasis in the bases noted. Musculoskeletal: No acute bony abnormality. CT ABDOMEN PELVIS FINDINGS Hepatobiliary: No focal liver abnormality is seen. No gallstones, gallbladder wall thickening, or biliary dilatation. Pancreas: Unremarkable. No pancreatic ductal dilatation or surrounding inflammatory changes. Spleen: Normal in size without focal abnormality. Adrenals/Urinary Tract: The right kidney appears normal. 0.6 cm calcification upper pole left kidney is compatible with a nonobstructing stone. Ureters and urinary bladder appear normal. Stomach/Bowel: The patient is status post low anterior resection. 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 There is some sigmoid diverticulosis. The appendix appears normal. No evidence of bowel obstruction. Stomach is unremarkable Vascular/Lymphatic: Aortic atherosclerosis. No enlarged abdominal or pelvic lymph nodes. Reproductive: Status post hysterectomy. No adnexal masses. Other: None. Musculoskeletal: No acute or focal abnormality. IMPRESSION: Interlobular septal thickening in the chest consistent with pulmonary edema. Associated small bilateral pleural effusions and cardiomegaly noted. No acute abnormality abdomen or pelvis. Status post low anterior resection with an ileostomy in place. Mild diverticulosis without diverticulitis noted. Aortic Atherosclerosis (ICD10-I70.0). Calcific coronary artery disease is also seen. Electronically Signed   By: Inge Rise M.D.   On: 05/21/2019 18:59    Procedures Procedures (including critical care time)  Medications Ordered in ED Medications  traMADol (ULTRAM) tablet 50 mg (50 mg Oral Given 05/21/19 1759)    ED Course  I have reviewed the triage vital signs and the nursing notes.  Pertinent labs & imaging results that were available during my care of the patient were reviewed by me and considered in my medical decision making (see chart for details).  Clinical Course as of  May 20 2099  Tue May 20, 2232  4630 69 year old female here with generalized weakness nausea decreased intake.  Complains of some chronic foot pain.  Has known worsening kidney disease and so far has refused dialysis.  Appears to have newly elevated LFTs.  Likely will need admission.   [MB]  2036 I spoke with Dr. Alcario Drought who will see patient for admission.    [EH]    Clinical Course User Index [EH] Lorin Glass, PA-C [MB] Hayden Rasmussen, MD   MDM Rules/Calculators/A&P                     Patient is a 69 year old woman who presents today for evaluation of generally feeling weak.  She has had poor appetite and has not eaten in the past 2 days.  On exam she appears chronically ill.  She has a prior history of rectal cancer for which she has a ostomy.  She was recently admitted for renal failure however refused dialysis.  On exam peripherally and centrally she appears fluid overloaded.  X-ray shows pulmonary edema, bilateral pleural effusions and increased heart size concerning for pericardial effusion.  Blood work is obtained, her creatinine is slightly improved from before, she remains borderline anemic and leukopenic which is unchanged.  She does appear to have a new transaminitis with elevated AST and ALT.  Acetaminophen level is undetected.  Hepatitis panel is obtained, lipase is not elevated.  CT chest, abdomen, and pelvis were obtained without contrast based on her kidney function.  CT scan show pulmonary edema without pericardial effusion or acute intra-abdominal abnormalities.  Patient states that she only wishes to undergo dialysis if it is that her  death.  We discussed the importance of preplanning for possible fistula creation to allow for dialysis if needed.  She was borderline hypoxic in the low 90s while here.  Patient will be admitted in the hospital.  I spoke with Dr. Alcario Drought who will see the patient for admission.  Note: Portions of this report may have been transcribed using  voice recognition software. Every effort was made to ensure accuracy; however, inadvertent computerized transcription errors may be present   Final Clinical Impression(s) / ED Diagnoses Final diagnoses:  Acute congestive heart failure, unspecified heart failure type ([place])  Transaminitis  Stage 5 chronic kidney disease not on chronic dialysis [facility])  Hypoxia    Rx / DC Orders ED Discharge Orders    None       Ollen Gross 05/21/19 2104    Hayden Rasmussen, MD 05/22/19 1309

## 2019-05-22 ENCOUNTER — Other Ambulatory Visit: Payer: Self-pay

## 2019-05-22 ENCOUNTER — Inpatient Hospital Stay (HOSPITAL_COMMUNITY): Payer: Medicare Other

## 2019-05-22 DIAGNOSIS — I5031 Acute diastolic (congestive) heart failure: Secondary | ICD-10-CM

## 2019-05-22 DIAGNOSIS — I509 Heart failure, unspecified: Secondary | ICD-10-CM

## 2019-05-22 DIAGNOSIS — I824Y1 Acute embolism and thrombosis of unspecified deep veins of right proximal lower extremity: Secondary | ICD-10-CM

## 2019-05-22 LAB — CBC
HCT: 25.7 % — ABNORMAL LOW (ref 36.0–46.0)
Hemoglobin: 7.8 g/dL — ABNORMAL LOW (ref 12.0–15.0)
MCH: 30.4 pg (ref 26.0–34.0)
MCHC: 30.4 g/dL (ref 30.0–36.0)
MCV: 100 fL (ref 80.0–100.0)
Platelets: 319 10*3/uL (ref 150–400)
RBC: 2.57 MIL/uL — ABNORMAL LOW (ref 3.87–5.11)
RDW: 18.1 % — ABNORMAL HIGH (ref 11.5–15.5)
WBC: 3.1 10*3/uL — ABNORMAL LOW (ref 4.0–10.5)
nRBC: 1.6 % — ABNORMAL HIGH (ref 0.0–0.2)

## 2019-05-22 LAB — COMPREHENSIVE METABOLIC PANEL
ALT: 169 U/L — ABNORMAL HIGH (ref 0–44)
AST: 85 U/L — ABNORMAL HIGH (ref 15–41)
Albumin: 3.3 g/dL — ABNORMAL LOW (ref 3.5–5.0)
Alkaline Phosphatase: 143 U/L — ABNORMAL HIGH (ref 38–126)
Anion gap: 11 (ref 5–15)
BUN: 67 mg/dL — ABNORMAL HIGH (ref 8–23)
CO2: 19 mmol/L — ABNORMAL LOW (ref 22–32)
Calcium: 8.9 mg/dL (ref 8.9–10.3)
Chloride: 108 mmol/L (ref 98–111)
Creatinine, Ser: 3.16 mg/dL — ABNORMAL HIGH (ref 0.44–1.00)
GFR calc Af Amer: 16 mL/min — ABNORMAL LOW (ref >60)
GFR calc non Af Amer: 14 mL/min — ABNORMAL LOW (ref >60)
Glucose, Bld: 167 mg/dL — ABNORMAL HIGH (ref 70–99)
Potassium: 3.8 mmol/L (ref 3.5–5.1)
Sodium: 138 mmol/L (ref 135–145)
Total Bilirubin: 0.5 mg/dL (ref 0.3–1.2)
Total Protein: 6.4 g/dL — ABNORMAL LOW (ref 6.5–8.1)

## 2019-05-22 LAB — HEPATITIS PANEL, ACUTE
HCV Ab: NONREACTIVE
Hep A IgM: NONREACTIVE
Hep B C IgM: NONREACTIVE
Hepatitis B Surface Ag: NONREACTIVE

## 2019-05-22 LAB — ECHOCARDIOGRAM COMPLETE

## 2019-05-22 LAB — TSH: TSH: 1.781 u[IU]/mL (ref 0.350–4.500)

## 2019-05-22 LAB — BRAIN NATRIURETIC PEPTIDE: B Natriuretic Peptide: >4500 pg/mL — ABNORMAL HIGH (ref 0.0–100.0)

## 2019-05-22 MED ORDER — DARBEPOETIN ALFA 100 MCG/0.5ML IJ SOSY
100.0000 ug | PREFILLED_SYRINGE | INTRAMUSCULAR | Status: DC
  Administered 2019-05-22 – 2019-05-29 (×2): 100 ug via SUBCUTANEOUS
  Filled 2019-05-22 (×2): qty 0.5

## 2019-05-22 MED ORDER — PERFLUTREN LIPID MICROSPHERE
1.0000 mL | INTRAVENOUS | Status: AC | PRN
  Administered 2019-05-22: 3 mL via INTRAVENOUS
  Filled 2019-05-22: qty 10

## 2019-05-22 MED ORDER — CHLORHEXIDINE GLUCONATE CLOTH 2 % EX PADS
6.0000 | MEDICATED_PAD | Freq: Every day | CUTANEOUS | Status: DC
  Administered 2019-05-22 – 2019-05-31 (×10): 6 via TOPICAL

## 2019-05-22 MED ORDER — FUROSEMIDE 10 MG/ML IJ SOLN
120.0000 mg | Freq: Three times a day (TID) | INTRAVENOUS | Status: DC
  Administered 2019-05-22 – 2019-05-26 (×13): 120 mg via INTRAVENOUS
  Filled 2019-05-22: qty 12
  Filled 2019-05-22: qty 2
  Filled 2019-05-22 (×2): qty 12
  Filled 2019-05-22 (×2): qty 10
  Filled 2019-05-22: qty 12
  Filled 2019-05-22: qty 10
  Filled 2019-05-22: qty 12
  Filled 2019-05-22 (×5): qty 10

## 2019-05-22 MED ORDER — SODIUM CHLORIDE 0.9% FLUSH
10.0000 mL | INTRAVENOUS | Status: DC | PRN
  Administered 2019-05-24: 10 mL

## 2019-05-22 MED ORDER — METOPROLOL SUCCINATE ER 25 MG PO TB24
12.5000 mg | ORAL_TABLET | Freq: Every day | ORAL | Status: DC
  Administered 2019-05-22 – 2019-05-25 (×4): 12.5 mg via ORAL
  Filled 2019-05-22 (×4): qty 1

## 2019-05-22 MED ORDER — SODIUM CHLORIDE 0.9 % IV SOLN
510.0000 mg | INTRAVENOUS | Status: AC
  Administered 2019-05-22 – 2019-05-29 (×2): 510 mg via INTRAVENOUS
  Filled 2019-05-22 (×2): qty 510

## 2019-05-22 MED ORDER — SODIUM CHLORIDE 0.9% FLUSH
10.0000 mL | Freq: Two times a day (BID) | INTRAVENOUS | Status: DC
  Administered 2019-05-24 – 2019-05-29 (×3): 10 mL

## 2019-05-22 NOTE — Progress Notes (Signed)
Echocardiogram 2D Echocardiogram has been performed.  Nicole Sellers 05/22/2019, 11:44 AM   Dr. Meda Coffee notified of critical result.

## 2019-05-22 NOTE — TOC Initial Note (Signed)
Transition of Care [facility]) - Initial/Assessment Note    Patient Details  Name: Nicole Sellers MRN: [id number] Date of Birth: October 25, 1950  Transition of Care [facility]) CM/SW Contact:    Dessa Phi, RN Phone Number: 05/22/2019, 3:30 PM  Clinical Narrative: d/c plan home.Nephrology-no dialysis needed currently.                  Expected Discharge Plan: Home/Self Care Barriers to Discharge: Continued Medical Work up   Patient Goals and CMS Choice Patient states their goals for this hospitalization and ongoing recovery are:: go home CMS Medicare.gov Compare Post Acute Care list provided to:: Other (Comment Required)(n/a) Choice offered to / list presented to : NA  Expected Discharge Plan and Services Expected Discharge Plan: Home/Self Care   Discharge Planning Services: CM Consult   Living arrangements for the past 2 months: Single Family Home                                      Prior Living Arrangements/Services Living arrangements for the past 2 months: Single Family Home Lives with:: Self Patient language and need for interpreter reviewed:: Yes Do you feel safe going back to the place where you live?: Yes      Need for Family Participation in Patient Care: No (Comment) Care giver support system in place?: Yes (comment)   Criminal Activity/Legal Involvement Pertinent to Current Situation/Hospitalization: No - Comment as needed  Activities of Daily Living Home Assistive Devices/Equipment: Walker (specify type) ADL Screening (condition at time of admission) Patient's cognitive ability adequate to safely complete daily activities?: Yes Is the patient deaf or have difficulty hearing?: No Does the patient have difficulty seeing, even when wearing glasses/contacts?: No Does the patient have difficulty concentrating, remembering, or making decisions?: No Patient able to express need for assistance with ADLs?: Yes Does the patient have difficulty dressing or bathing?:  Yes Independently performs ADLs?: No Communication: Independent Dressing (OT): Needs assistance Is this a change from baseline?: Change from baseline, expected to last >3 days Grooming: Independent Feeding: Independent Bathing: Needs assistance Is this a change from baseline?: Change from baseline, expected to last >3 days Toileting: Needs assistance Is this a change from baseline?: Change from baseline, expected to last >3days In/Out Bed: Needs assistance Is this a change from baseline?: Change from baseline, expected to last >3 days Walks in Home: Needs assistance Is this a change from baseline?: Change from baseline, expected to last >3 days Does the patient have difficulty walking or climbing stairs?: Yes Weakness of Legs: Both Weakness of Arms/Hands: Both  Permission Sought/Granted Permission sought to share information with : Case Manager Permission granted to share information with : Yes, Verbal Permission Granted  Share Information with NAME: Case Manager     Permission granted to share info w Relationship: Harrell Gave son [phone]     Emotional Assessment Appearance:: Appears stated age Attitude/Demeanor/Rapport: Gracious Affect (typically observed): Accepting Orientation: : Oriented to Self, Oriented to Place, Oriented to  Time, Oriented to Situation Alcohol / Substance Use: Not Applicable Psych Involvement: No (comment)  Admission diagnosis:  Transaminitis [R74.01] Hypoxia [R09.02] New onset of congestive heart failure (HCC) [I50.9] Stage 5 chronic kidney disease not on chronic dialysis (HCC) [N18.5] Acute congestive heart failure, unspecified heart failure type ([place]) [I50.9] Patient Active Problem List   Diagnosis Date Noted  . CKD (chronic kidney disease) stage 4, GFR 15-29 ml/min (HCC)  05/21/2019  . Fluid overload 05/21/2019  . New onset of congestive heart failure ([place]) 05/21/2019  . Anemia of chronic disease 05/21/2019  . Transaminitis 05/21/2019  .  Acute kidney injury ([place]) 03/29/2019  . DVT (deep venous thrombosis) ([place]) 03/29/2019  . Oral candidiasis   . AKI (acute kidney injury) ([place]) 10/01/2018  . Hypokalemia 10/01/2018  . Pyuria 10/01/2018  . Odynophagia 10/01/2018  . Hyponatremia 10/01/2018  . Right sided weakness 10/01/2018  . SBO (small bowel obstruction) ([place]) 08/22/2018  . Dehydration 08/21/2018  . Pre-operative cardiovascular examination 06/07/2018  . Rectal cancer ([place]) 11/06/2017  . Claudication in peripheral vascular disease ([place]) 05/08/2017  . Carotid artery disease ([place]) 04/14/2017  . PAD (peripheral artery disease) Rt ABI 0.7, Lt ABI 0.46 12/25/2012  . S/P arterial stent, 12/25/13, successful diamondback orbital rotational arthrectomy, PTA using chocolate  balloon and stenting using I DEV stent of long segment calcified high-grade proximal and mid r 12/25/2012  . Gangrene of toe, Rt second toe 12/25/2012  . Essential hypertension 12/13/2012  . Non-insulin treated type 2 diabetes mellitus ([place]) 12/13/2012  . Critical lower limb ischemia 12/13/2012  . Tobacco abuse 12/13/2012   PCP:  Reynold Bowen, MD Pharmacy:   [id number] [place], [place] [address] [place] [address] Phone: [phone] Fax: [phone]  [place], [place] [facility] [address] [place] [address] [place] [address] Phone: [phone] Fax: [place], [place] [address] [place] [address] Phone: [phone] Fax: [phone]     Social Determinants of Health (SDOH) Interventions    Readmission Risk Interventions Readmission Risk Prevention Plan 05/22/2019 05/22/2019 10/02/2018  Transportation Screening - Complete Complete  Medication Review ([place]) - Complete Complete  PCP or Specialist appointment within 3-5 days of  discharge - Complete Not Complete  PCP/Specialist Appt Not Complete comments - - not ready to dc  HRI or Home Care Consult - Complete Complete  SW Recovery Care/Counseling Consult - Complete Complete  SW Consult Not Complete Comments - - -  Palliative Care Screening Complete Not Applicable Not Applicable  Skilled Nursing Facility - Not Applicable Not Applicable  Some recent data might be hidden

## 2019-05-22 NOTE — Progress Notes (Signed)
Initial Nutrition Assessment  INTERVENTION:   -Provide daily snack 2pm and HS -Please obtain weight for admission  NUTRITION DIAGNOSIS:   Inadequate oral intake related to poor appetite as evidenced by per patient/family report.  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  Patient reports poor appetite for the past 3 weeks. States she has been consuming liquids fine but just has no appetite for any foods. Pt states that she is mainly consuming liquids for the past 2 days. Not sure what she can eat on her diet. Reviewed options with patient. Pt is not interested in any protein supplements, states she does not like them. Offered to provide snacks between meals and pt was interested in a sandwich and fruit. RD placed order.  Per pt, she has not been weighed this admission. Last weight in chart is dated 04/01/19. Pt states she doesn't feel like she's lost weight. Pt does have some severe edema, may be masking weight loss.   I/Os: +185 ml since admit UOP: 550 ml so far today Ileostomy output: 325 ml today  Labs reviewed. Medications: Niferex, IV Lasix   NUTRITION - FOCUSED PHYSICAL EXAM:  Depletions may be masked by edema.  Diet Order:   Diet Order            Diet renal/carb modified with fluid restriction Diet-HS Snack? Nothing; Fluid restriction: 1200 mL Fluid; Room service appropriate? Yes; Fluid consistency: Thin  Diet effective now               EDUCATION NEEDS:   Education needs have been addressed  Skin:  Skin Assessment: Reviewed RN Assessment  Last BM:  5/12  Height:   Ht Readings from Last 1 Encounters:  03/29/19 5' 6.5" (1.689 m)    Weight:   Wt Readings from Last 1 Encounters:  04/01/19 70.1 kg    Ideal Body Weight:  59.1 kg  BMI:  There is no height or weight on file to calculate BMI.  Estimated Nutritional Needs:   Kcal:  unable to calculate given no weight for admission  Protein:  "  Fluid:  "  Clayton Bibles, MS, RD, LDN Inpatient Clinical Dietitian Contact information available via Amion

## 2019-05-22 NOTE — Consult Note (Addendum)
Cardiology Consultation:   Patient ID: Nicole Sellers MRN: [id number]; DOB: [birth year]  Admit date: 05/21/2019 Date of Consult: 05/22/2019  Primary Care Provider: Reynold Bowen, Nicole Sellers Primary Cardiologist: Quay Burow, Nicole Sellers  Primary Electrophysiologist:  None    Patient Profile:   Nicole Sellers is a 69 y.o. female with a history of low risk stress test in 06/2018, PAD s/p PTA and stenting of right superficial femoral artery in 2014 for an ischemic toe which ultimately required second right toe amputation and then PTA and stenting of right external iliac artery in 04/2017, bilateral carotid artery disease with 60-79% stenosis of left ICA and 1-39% stenosis of right ICA, DVT in 11/2018 which initially went untreated but started on Eliquis in 03/2019, hypertension, hyperlipidemia, type 2 diabetes mellitus with neuropathy, CKD stage IV-V, hypothyroidism, anemia, and stage 3 rectal cancer diagnosed in 2019 s/p chemo and resection, who is being seen today for the evaluation of newly reduced EF at the request of Dr. Cruzita Lederer.  History of Present Illness:   Nicole Sellers is a 69 year old female with the above history who is followed by Dr. Gwenlyn Found. Patient has a long history of PAD. She underwent PTA/stenting to right superficial artery in 2014 for an ischemic toe and ultimately required 2nd toe amputation. She then underwent PTA/stenting to right external iliac artery in 04/2017. Most recent lower extremity dopplers in 03/2019 showed patent right SFA stent and 30-40% stenosis of left common femoral artery as well as occlusion of the right peroneal artery, left SFA with reconstition in the distal segment, and left peroneal artery. Bilateral ABIs and TBIs decreased from 2019 studies. She also has known carotid artery disease. Most recent dopplers in 06/2018 showed showed 1-39% stenosis of right ICA and 60-79% stenosis of left ICA. She was last seen by Nicole Ransom, Nicole Sellers, in 05/2018 for pre-op evaluation for upcoming  colectomy for stage III rectal cancer. She noted some chest pain during chemotherapy so Myoview was ordered for further evaluation. Myoview showed a small defect in the basal anterior location but was considered low risk overall. Patient was felt to be at acceptable risk for surgery. Patient was admitted in 09/2018 for dehydration and AKI secondary to dysphasia/odynophagia. Creatinine peaked at 4.21. She was found to have extensive esophagitis with small ulceration of plaque-like lesions extending from the lower cervical esophagus to the lower thoracic esophagus. GI recommended PPI and Fluconazole. Also treated with IV fluids. She was admitted again recently from 03/29/2019 to 04/04/2019 for AKI. Creatinine 5.04 on admission, up from 2.10 in 11/2018. Renal ultrasound with suboptimal view of left kidney but otherwise unremarkable. FeNA consistent with pre-renal azotemia. FeUrea consistent with intrinsic renal disease. Home diuretics, Metformin, and Glimepiride were held. Nephrology was consulted and recommended outpatient follow-up. Patient was also noted to be severely anemic during admission with hemoglobin of 6.3 but patient refused transfusions. Felt to be due to anemia of chronic disease secondary to renal failure. Of note, she was found to have a right lower extremity DVT in 11/2018 but was not started on anticoagulation due to loss of follow-up. Repeat dopplers during this admission showed persistent DVT so started on Eliquis '5mg'$  twice daily.  Patient presented to [facility] on 05/21/2019 for further evaluation weakness, shortness of breath, edema.  Patient reports worsening fatigue/weakness/patient in her room for the past month as well as worsening shortness of breath on activity, orthopnea, PND, and lower extremity edema.  She denies any weight gain or abdominal distention but  does report decreased appetite.  Per H&P, she has not eaten in the past 2 days.  She denies any dysphagia/odynophagia and  states she just does not have an appetite.  No chest pain, palpitations, lightheadedness, dizziness, syncope. Stools are dark due to iron supplementation but no abnormal bleeding in urine or stools. She notes a mild nonproductive cough but denies any recent fevers or illnesses.  Patient's family is very concerned and called 26.  In the ED, vitals stable.  EKG showed normal sinus rhythm with left bundle branch block.  No significant changes compared to tracing in 03/2019.  Chest x-ray consistent with mild to moderate CHF with diffusely increased lung markings, mild atelectasis and/or infiltrate within the bilateral lung bases, and small bilateral pleural effusions.  Chest CT showed interlobular septal thickening consistent with pulmonary edema and associated small bilateral pleural effusions.  No acute abnormalities were noted in the abdomen or pelvis.  WBC 3.2, Hgb 7.4, Plts 307. Na 136, K 4.1, Glucose 153, BUN 73, Cr 3.29. Albumin 3.2, AST 101, ALT 187, Alk Phos 159. Lipase normal. Hepatitis panel non-reactive. COVID-19 negative. Patient started on IV Lasix and admitted for further evaluation. Volume overload felt to be due to renal failure; however, repeat Echo was ordered. Echo showed LVEF of 15-20% with severe diffuse hypokinesis. EF down from 50-55% in 03/2019. Cardiology consulted for further evaluation.   Of note, patient had previously stated she did not want dialysis. She told admitting provider that if it was dialysis vs death, she would be OK with dialysis. She still sounds like she is on the fence about it when talking with me.   Past Medical History:  Diagnosis Date  . Age-related nuclear cataract, right eye   . Allergy   . Anemia   . Arthritis    "joints sometimes" (12/25/2012)  . Clotting disorder ([place])   . Colon cancer ([place])   . Critical lower limb ischemia   . Diverticulosis   . Gangrene of toe, Rt second toe 12/25/2012  . GERD (gastroesophageal reflux disease)   . High cholesterol    . Hypertension   . Hypothyroidism   . PAD (peripheral artery disease) ([place])   . PAD (peripheral artery disease) ([place])   . Peripheral neuropathy   . Rectal cancer ([place])   . S/P arterial stent, 12/25/13, successful diamondback orbital rotational arthrectomy, PTA using chocolate  balloon and stenting using I DEV stent of long segment calcified high-grade proximal and mid r 12/25/2012  . Tobacco abuse   . Type II diabetes mellitus ([place])     Past Surgical History:  Procedure Laterality Date  . ABDOMINAL AORTAGRAM  12/20/2012   Procedure: ABDOMINAL AORTAGRAM;  Surgeon: Nicole Harp, Nicole Sellers;  Location: [facility] CATH LAB;  Service: Cardiovascular;;  . ABDOMINAL HYSTERECTOMY  2000  . ANGIOPLASTY / STENTING FEMORAL Right 12/25/2012  . ATHERECTOMY Right 12/25/2012   Procedure: ATHERECTOMY;  Surgeon: Nicole Harp, Nicole Sellers;  Location: [facility] CATH LAB;  Service: Cardiovascular;  Laterality: Right;  right SFA  . COLONOSCOPY    . DIVERTING ILEOSTOMY N/A 08/03/2018   Procedure: DIVERTING LOOP ILEOSTOMY;  Surgeon: Leighton Ruff, Nicole Sellers;  Location: [facility] ORS;  Service: General;  Laterality: N/A;  . ESOPHAGOGASTRODUODENOSCOPY N/A 10/04/2018   Procedure: ESOPHAGOGASTRODUODENOSCOPY (EGD);  Surgeon: Carol Ada, Nicole Sellers;  Location: [facility] END[facility];  Service: Endoscopy;  Laterality: N/A;  . EYE SURGERY Left    cataract  . HERNIA REPAIR    . LOWER EXTREMITY ANGIOGRAM N/A 12/20/2012   Procedure: LOWER EXTREMITY  ANGIOGRAM;  Surgeon: Nicole Harp, Nicole Sellers;  [facility];  Service: Cardiovascular;  Laterality: N/A;  . LOWER EXTREMITY ANGIOGRAM N/A 12/25/2012   Procedure: LOWER EXTREMITY ANGIOGRAM;  Surgeon: Nicole Harp, Nicole Sellers;  Location[facility] CATH LAB;  Service: Cardiovascular;  Laterality: N/A;  . LOWER EXTREMITY INTERVENTION  05/08/2017  . LOWER EXTREMITY INTERVENTION Bilateral 05/08/2017   Procedure: LOWER EXTREMITY INTERVENTION;  Surgeon: Nicole Harp, Nicole Sellers;  Location: [place] CV LAB;  Service: Cardiovascular;   Laterality: Bilateral;  . PERIPHERAL VASCULAR BALLOON ANGIOPLASTY Right 05/08/2017   Procedure: PERIPHERAL VASCULAR BALLOON ANGIOPLASTY;  Surgeon: Nicole Harp, Nicole Sellers;  Location: [place] CV LAB;  Service: Cardiovascular;  Laterality: Right;  sfa  . PERIPHERAL VASCULAR INTERVENTION Right 05/08/2017   Procedure: PERIPHERAL VASCULAR INTERVENTION;  Surgeon: Nicole Harp, Nicole Sellers;  Location: [place] CV LAB;  Service: Cardiovascular;  Laterality: Right;  ext iliac  . PORTACATH PLACEMENT Right 11/22/2017   Procedure: INSERTION PORT-A-CATH;  Surgeon: Leighton Ruff, Nicole Sellers;  Location: [facility] OR[facility];  Service: General;  Laterality: Right;  . SAVORY DILATION N/A 10/04/2018   Procedure: SAVORY DILATION;  Surgeon: Carol Ada, Nicole Sellers;  Location: [facility] ENDOSCOPY;  Service: Endoscopy;  Laterality: N/A;  . TOE SURGERY Right    2n toe   . UMBILICAL HERNIA REPAIR  2000  . UMBILICAL HERNIA REPAIR N/A 08/03/2018   Procedure: UMBILICAL HERNIA REPAIR;  Surgeon: Leighton Ruff, Nicole Sellers;  Location: [facility];  Service: General;  Laterality: N/A;  . XI ROBOTIC ASSISTED LOWER ANTERIOR RESECTION N/A 08/03/2018   Procedure: XI ROBOTIC ASSISTED LOWER ANTERIOR RESECTION; DIAGNOSTIC FLEXIBLE SIGMOIDOSCOPY; INTRAOPERATIVE ASSESSMENT OF VASCULAR PERFUSION;  Surgeon: Leighton Ruff, Nicole Sellers;  Location: [facility] OR[facility];  Service: General;  Laterality: N/A;     Home Medications:  Prior to Admission medications   Medication Sig Start Date End Date Taking? Authorizing Provider  albuterol (PROVENTIL HFA;VENTOLIN HFA) 108 (90 BASE) MCG/ACT inhaler Inhale 2 puffs into the lungs every 6 (six) hours as needed for wheezing or shortness of breath.   Yes Provider, Historical, [facility]  apixaban (ELIQUIS) 5 MG TABS tablet Take 5 mg by mouth 2 (two) times daily.   Yes Provider, Historical, Nicole Sellers  aspirin 81 MG tablet Take 81 mg by mouth daily.   Yes Provider, Historical, Nicole [facility]  atorvastatin (LIPITOR) 10 MG tablet Take 1 tablet (10 mg total) by mouth daily. Patient taking  differently: Take 10 mg by mouth at bedtime.  01/01/18  Yes Minette Brine, FNP  Calcium Carbonate-Vitamin D (CALCIUM 600+D PO) Take 1 tablet by mouth daily.   Yes Provider, Historical, Nicole Sellers  cetirizine (ZYRTEC) 10 MG tablet Take 10 mg by mouth daily.   Yes Provider, Historical, Nicole Sellers  Cholecalciferol (VITAMIN D3) 50 MCG (2000 UT) TABS Take 4,000 Units by mouth daily.   Yes Provider, Historical, Nicole Sellers  FERREX 150 150 MG capsule Take 150 mg by mouth 2 (two) times daily. 08/29/17  Yes Provider, Historical, Nicole [facility]  furosemide (LASIX) 40 MG tablet Take 60 mg by mouth daily.  05/08/19  Yes Provider, Historical, Nicole Sellers  gabapentin (NEURONTIN) 300 MG capsule Take 300 mg by mouth 2 (two) times daily.    Yes Provider, Historical, Nicole Sellers  levothyroxine (SYNTHROID, LEVOTHROID) 50 MCG tablet Take 1 tablet (50 mcg total) by mouth daily. 01/01/18  Yes Minette Brine, FNP  Multiple Vitamins-Minerals (SENIOR MULTIVITAMIN PLUS PO) Take 1 tablet by mouth daily with breakfast.   Yes Provider, Historical, [facility]  omeprazole (PRILOSEC OTC) 20 MG tablet Take 20 mg by mouth daily  before breakfast.   Yes Provider, Historical, [facility]  Psyllium (METAMUCIL FIBER PO) Take 1 Scoop by mouth See admin instructions. Mix 1 scoopful into 6-8 ounces of desired beverage and drink two times a day   Yes Provider, Historical, Nicole [facility]ellers  sitaGLIPtin (JANUVIA) 25 MG tablet Take 1 tablet (25 mg total) by mouth daily. 04/04/19  Yes Donne Hazel, [facility]  sodium zirconium cyclosilicate (LOKELMA) 5 g packet Take 5 g by mouth 2 (two) times daily.   Yes Provider, Historical, Nicole [facility]  tetrahydrozoline-zinc (VISINE-AC) 0.05-0.25 % ophthalmic solution Place 1 drop into both eyes 2 (two) times daily as needed (dry eyes).   Yes Provider, Historical, [facility]  traMADol (ULTRAM) 50 MG tablet Take 1 tablet (50 mg total) by mouth every 12 (twelve) hours as needed for moderate pain. 04/04/19  Yes Donne Hazel, [facility]  Triamcinolone Acetonide (NASACORT ALLERGY 24HR NA) Place 1 spray into both nostrils daily as  needed (allergies).    Yes Provider, Historical, [facility]  Trolamine Salicylate (ASPERCREME EX) Apply 1 application topically daily as needed (to affected areas, for joint pain).    Yes Provider, Historical, Nicole [facility]ellers  vitamin B-12 (CYANOCOBALAMIN) 500 MCG tablet Take 500 mcg by mouth daily.   Yes Provider, Historical, [facility]ellers  apixaban (ELIQUIS) 5 MG TABS tablet Take 1 tablet (5 mg total) by mouth 2 (two) times daily. 04/04/19 05/04/19  Donne Hazel, Nicole Sellers    Inpatient Medications: Scheduled Meds: . apixaban  5 mg Oral BID  . aspirin  81 mg Oral Daily  . atorvastatin  10 mg Oral QHS  . Chlorhexidine Gluconate Cloth  6 each Topical Daily  . furosemide  60 mg Intravenous Daily  . gabapentin  300 mg Oral BID  . iron polysaccharides  150 mg Oral BID  . levothyroxine  50 mcg Oral Daily  . linagliptin  5 mg Oral Daily  . omeprazole  20 mg Oral Daily  . sodium chloride flush  10-40 mL Intracatheter Q12H  . sodium zirconium cyclosilicate  5 g Oral BID   Continuous Infusions:  PRN Meds: acetaminophen **OR** acetaminophen, albuterol, naphazoline-glycerin, ondansetron **OR** ondansetron (ZOFRAN) IV, perflutren lipid microspheres (DEFINITY) IV suspension, sodium chloride flush, traMADol  Allergies:    Allergies  Allergen Reactions  . Kiwi Extract Itching and Other (See Comments)    Throat itching   . Lisinopril Other (See Comments)    Bruising   . Plavix [Clopidogrel] Other (See Comments)    "Made me feel badly- didn't agree with my system"    Social History:   Social History   Socioeconomic History  . Marital status: Single    Spouse name: Not on file  . Number of children: 1  . Years of education: Not on file  . Highest education level: Not on file  Occupational History  . Occupation: CNA    Comment: Retired   Tobacco Use  . Smoking status: Former Smoker    Packs/day: 0.50    Years: 48.00    Pack years: 24.00    Types: Cigarettes  . Smokeless tobacco: Never Used  . Tobacco comment: quit  in 2016  Substance and Sexual Activity  . Alcohol use: No    Alcohol/week: 0.0 standard drinks  . Drug use: No  . Sexual activity: Not Currently  Other Topics Concern  . Not on file  Social History Narrative  . Not on file   Social Determinants of Health   Financial Resource Strain:   . Difficulty of Paying Living Expenses:  Food Insecurity:   . Worried About Charity fundraiser in the Last Year:   . Arboriculturist in the Last Year:   Transportation Needs:   . Film/video editor (Medical):   Marland Kitchen Lack of Transportation (Non-Medical):   Physical Activity:   . Days of Exercise per Week:   . Minutes of Exercise per Session:   Stress:   . Feeling of Stress :   Social Connections:   . Frequency of Communication with Friends and Family:   . Frequency of Social Gatherings with Friends and Family:   . Attends Religious Services:   . Active Member of Clubs or Organizations:   . Attends Archivist Meetings:   Marland Kitchen Marital Status:   Intimate Partner Violence:   . Fear of Current or Ex-Partner:   . Emotionally Abused:   Marland Kitchen Physically Abused:   . Sexually Abused:     Family History:    Family History  Problem Relation Age of Onset  . Hypertension Mother   . Stroke Mother   . Cancer Father        prostate  . Mental illness Sister   . Diabetes Brother   . Hypertension Brother   . Cancer Brother 32       prostate  . Diabetes Brother   . Hypertension Brother   . Cancer Brother        "tumors on neck and body"      ROS:  Please see the history of present illness.  All other ROS reviewed and negative.     Physical Exam/Data:   Vitals:   05/22/19 0557 05/22/19 0953 05/22/19 1010 05/22/19 1314  BP: 101/86 (!) 87/66 123/73 123/68  Pulse: 81 86 90 78  Resp: '17 14  14  '$ Temp: (!) 97.5 F (36.4 C) 98.1 F (36.7 C)  97.7 F (36.5 C)  TempSrc: Oral Oral  Oral  SpO2: (!) 75% 100%  100%    Intake/Output Summary (Last 24 hours) at 05/22/2019 1327 Last data filed  at 05/22/2019 1000 Gross per 24 hour  Intake 420 ml  Output 375 ml  Net 45 ml   Last 3 Weights 04/01/2019 03/31/2019 03/29/2019  Weight (lbs) 154 lb 8.7 oz 151 lb 7.3 oz 160 lb  Weight (kg) 70.1 kg 68.7 kg 72.576 kg     There is no height or weight on file to calculate BMI.   Physical Exam per [facility].   EKG:  The EKG was personally reviewed and demonstrates:  Normal sinus rhythm with LBBB and PVC.   Telemetry:  Telemetry was personally reviewed and demonstrates:  Nicole [facility]ellers to review.  Relevant CV Studies:  Lexiscan Myoview 06/14/2018:  Nuclear stress EF: 56%.  The left ventricular ejection fraction is normal (55-65%).  There was no ST segment deviation noted during stress.  No T wave inversion was noted during stress.  Defect 1: There is a small defect of mild severity present in the basal anterior location.  The study is normal.  This is a low risk study.   Low risk stress nuclear study with normal perfusion and normal left ventricular regional and global systolic function. _______________  Echocardiogram 05/22/2019: Impressions: 1. RVEF is moderately decreased.  2. Since the last study on 03/31/2019 there has been a significant change,  LVEF has decreased from 50-55% (with akinesis in the basal and mid  anteroseptal and anterior walls) to 15-20% with severe diffuse hypokinesis  and paradoxical septal motion.  3. There is  no thrombus on the left ventricle (on Definity echo contrast  imaging).  4. Left ventricular ejection fraction, by estimation, is 20 to 25%. The  left ventricle has severely decreased function. The left ventricle  demonstrates global hypokinesis. The left ventricular internal cavity size  was moderately dilated. Left  ventricular diastolic parameters are consistent with Grade II diastolic  dysfunction (pseudonormalization). Elevated left atrial pressure. The  average left ventricular global longitudinal strain is -7.0 %.  5. Right ventricular systolic  function is moderately reduced. The right  ventricular size is moderately enlarged. There is mildly elevated  pulmonary artery systolic pressure. The estimated right ventricular  systolic pressure is 58.8 mmHg.  6. Left atrial size was moderately dilated.  7. Right atrial size was mildly dilated.  8. The mitral valve is normal in structure. Moderate mitral valve  regurgitation. No evidence of mitral stenosis.  9. Tricuspid valve regurgitation is moderate.  10. The aortic valve is normal in structure. Aortic valve regurgitation is  mild. Mild to moderate aortic valve sclerosis/calcification is present,  without any evidence of aortic stenosis.  11. The inferior vena cava is normal in size with greater than 50%  respiratory variability, suggesting right atrial pressure of 3 mmHg.   Laboratory Data:  High Sensitivity Troponin:  No results for input(s): TROPONINIHS in the last 720 hours.   Chemistry Recent Labs  Lab 05/21/19 1604 05/22/19 0500  NA 136 138  K 4.1 3.8  CL 107 108  CO2 17* 19*  GLUCOSE 153* 167*  BUN 73* 67*  CREATININE 3.29* 3.16*  CALCIUM 8.8* 8.9  GFRNONAA 13* 14*  GFRAA 16* 16*  ANIONGAP 12 11    Recent Labs  Lab 05/21/19 1604 05/22/19 0500  PROT 6.2* 6.4*  ALBUMIN 3.2* 3.3*  AST 101* 85*  ALT 187* 169*  ALKPHOS 159* 143*  BILITOT 0.6 0.5   Hematology Recent Labs  Lab 05/21/19 1604 05/22/19 0500  WBC 3.2* 3.1*  RBC 2.39* 2.57*  HGB 7.4* 7.8*  HCT 24.1* 25.7*  MCV 100.8* 100.0  MCH 31.0 30.4  MCHC 30.7 30.4  RDW 17.5* 18.1*  PLT 307 319   BNPNo results for input(s): BNP, PROBNP in the last 168 hours.  DDimer No results for input(s): DDIMER in the last 168 hours.   Radiology/Studies:  CT ABDOMEN PELVIS WO CONTRAST  Result Date: 05/21/2019 CLINICAL DATA:  Pleural effusions, jaundice and weakness for 3 days. EXAM: CT CHEST, ABDOMEN AND PELVIS WITHOUT CONTRAST TECHNIQUE: Multidetector CT imaging of the chest, abdomen and pelvis was  performed following the standard protocol without IV contrast. COMPARISON:  CT abdomen and pelvis 03/30/2019. FINDINGS: CT CHEST FINDINGS Cardiovascular: Cardiomegaly. Calcific aortic and coronary atherosclerosis. No pericardial effusion. No aortic aneurysm. Mediastinum/Nodes: No enlarged mediastinal, hilar, or axillary lymph nodes. Thyroid gland, trachea, and esophagus demonstrate no significant findings. Lungs/Pleura: Small bilateral pleural effusions. There is interlobular septal thickening and scattered foci of mild ground-glass attenuation. No consolidative process, nodule or mass. Mild compressive atelectasis in the bases noted. Musculoskeletal: No acute bony abnormality. CT ABDOMEN PELVIS FINDINGS Hepatobiliary: No focal liver abnormality is seen. No gallstones, gallbladder wall thickening, or biliary dilatation. Pancreas: Unremarkable. No pancreatic ductal dilatation or surrounding inflammatory changes. Spleen: Normal in size without focal abnormality. Adrenals/Urinary Tract: The right kidney appears normal. 0.6 cm calcification upper pole left kidney is compatible with a nonobstructing stone. Ureters and urinary bladder appear normal. Stomach/Bowel: The patient is status post low anterior resection. 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 There is some sigmoid  diverticulosis. The appendix appears normal. No evidence of bowel obstruction. Stomach is unremarkable Vascular/Lymphatic: Aortic atherosclerosis. No enlarged abdominal or pelvic lymph nodes. Reproductive: Status post hysterectomy. No adnexal masses. Other: None. Musculoskeletal: No acute or focal abnormality. IMPRESSION: Interlobular septal thickening in the chest consistent with pulmonary edema. Associated small bilateral pleural effusions and cardiomegaly noted. No acute abnormality abdomen or pelvis. Status post low anterior resection with an ileostomy in place. Mild diverticulosis without diverticulitis noted. Aortic Atherosclerosis (ICD10-I70.0). Calcific  coronary artery disease is also seen. Electronically Signed   By: Nicole Rise M.D.   On: 05/21/2019 18:59   DG Chest 2 View  Result Date: 05/21/2019 CLINICAL DATA:  Weakness x3 days. EXAM: CHEST - 2 VIEW COMPARISON:  March 30, 2019 FINDINGS: There is stable right-sided venous Port-A-Cath positioning. Mild to moderate severity diffusely increased lung markings are seen which is increased in severity when compared to the prior study. Mild areas of atelectasis and/or infiltrate are seen within the bilateral lung bases. There are small bilateral pleural effusions. No pneumothorax is identified. The cardiac silhouette is markedly enlarged. This is increased in size when compared to the prior study. The visualized skeletal structures are unremarkable. IMPRESSION: 1. Findings consistent with mild to moderate congestive heart failure. 2. Mild bibasilar atelectasis and/or infiltrate. 3. Small bilateral pleural effusions. 4. Interval enlargement of the cardiac silhouette since the prior chest plain film dated March 30, 2019. The presence of a pericardial effusion cannot be excluded. Electronically Signed   By: Nicole Norfolk M.D.   On: 05/21/2019 18:16   CT Chest Wo Contrast  Result Date: 05/21/2019 CLINICAL DATA:  Pleural effusions, jaundice and weakness for 3 days. EXAM: CT CHEST, ABDOMEN AND PELVIS WITHOUT CONTRAST TECHNIQUE: Multidetector CT imaging of the chest, abdomen and pelvis was performed following the standard protocol without IV contrast. COMPARISON:  CT abdomen and pelvis 03/30/2019. FINDINGS: CT CHEST FINDINGS Cardiovascular: Cardiomegaly. Calcific aortic and coronary atherosclerosis. No pericardial effusion. No aortic aneurysm. Mediastinum/Nodes: No enlarged mediastinal, hilar, or axillary lymph nodes. Thyroid gland, trachea, and esophagus demonstrate no significant findings. Lungs/Pleura: Small bilateral pleural effusions. There is interlobular septal thickening and scattered foci of mild  ground-glass attenuation. No consolidative process, nodule or mass. Mild compressive atelectasis in the bases noted. Musculoskeletal: No acute bony abnormality. CT ABDOMEN PELVIS FINDINGS Hepatobiliary: No focal liver abnormality is seen. No gallstones, gallbladder wall thickening, or biliary dilatation. Pancreas: Unremarkable. No pancreatic ductal dilatation or surrounding inflammatory changes. Spleen: Normal in size without focal abnormality. Adrenals/Urinary Tract: The right kidney appears normal. 0.6 cm calcification upper pole left kidney is compatible with a nonobstructing stone. Ureters and urinary bladder appear normal. Stomach/Bowel: The patient is status post low anterior resection. 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 There is some sigmoid diverticulosis. The appendix appears normal. No evidence of bowel obstruction. Stomach is unremarkable Vascular/Lymphatic: Aortic atherosclerosis. No enlarged abdominal or pelvic lymph nodes. Reproductive: Status post hysterectomy. No adnexal masses. Other: None. Musculoskeletal: No acute or focal abnormality. IMPRESSION: Interlobular septal thickening in the chest consistent with pulmonary edema. Associated small bilateral pleural effusions and cardiomegaly noted. No acute abnormality abdomen or pelvis. Status post low anterior resection with an ileostomy in place. Mild diverticulosis without diverticulitis noted. Aortic Atherosclerosis (ICD10-I70.0). Calcific coronary artery disease is also seen. Electronically Signed   By: Nicole Rise M.D.   On: 05/21/2019 18:59   ECHOCARDIOGRAM COMPLETE  Result Date: 05/22/2019    ECHOCARDIOGRAM REPORT   Patient Name:   RIKI BERNINGER Date of Exam: 05/22/2019 Medical  Rec #:  [id number]        Height:       66.5 in Accession #:    [id number]       Weight:       154.5 lb Date of Birth:  January 12, 1950        BSA:          1.802 m Patient Age:    1 years         BP:           123/73 mmHg Patient Gender: F                HR:           84 bpm.  Exam Location:  Inpatient Procedure: 2D Echo, 3D Echo, Color Doppler, Cardiac Doppler and Strain Analysis Indications:    W23.76 Acute diastolic (congestive) heart failure  History:        Patient has prior history of Echocardiogram examinations, most                 recent 03/31/2019. Risk Factors:Hypertension, Diabetes and                 Dyslipidemia.  Sonographer:    Raquel Sarna Senior RDCS Referring Phys: [phone] JARED M GARDNER  Sonographer Comments: Patient scanned sitting up due to dyspnea. IMPRESSIONS  1. RVEF is moderately decreased.  2. Since the last study on 03/31/2019 there has been a significant change, LVEF has decreased from 50-55% (with akinesis in the basal and mid anteroseptal and anterior walls) to 15-20% with severe diffuse hypokinesis and paradoxical septal motion.  3. There is no thrombus on the left ventricle (on Definity echo contrast imaging).  4. Left ventricular ejection fraction, by estimation, is 20 to 25%. The left ventricle has severely decreased function. The left ventricle demonstrates global hypokinesis. The left ventricular internal cavity size was moderately dilated. Left ventricular diastolic parameters are consistent with Grade II diastolic dysfunction (pseudonormalization). Elevated left atrial pressure. The average left ventricular global longitudinal strain is -7.0 %.  5. Right ventricular systolic function is moderately reduced. The right ventricular size is moderately enlarged. There is mildly elevated pulmonary artery systolic pressure. The estimated right ventricular systolic pressure is 51.7 mmHg.  6. Left atrial size was moderately dilated.  7. Right atrial size was mildly dilated.  8. The mitral valve is normal in structure. Moderate mitral valve regurgitation. No evidence of mitral stenosis.  9. Tricuspid valve regurgitation is moderate. 10. The aortic valve is normal in structure. Aortic valve regurgitation is mild. Mild to moderate aortic valve sclerosis/calcification is  present, without any evidence of aortic stenosis. 11. The inferior vena cava is normal in size with greater than 50% respiratory variability, suggesting right atrial pressure of 3 mmHg. FINDINGS  Left Ventricle: Left ventricular ejection fraction, by estimation, is 20 to 25%. The left ventricle has severely decreased function. The left ventricle demonstrates global hypokinesis. The average left ventricular global longitudinal strain is -7.0 %. The left ventricular internal cavity size was moderately dilated. There is no left ventricular hypertrophy. Abnormal (paradoxical) septal motion, consistent with left bundle branch block. Left ventricular diastolic parameters are consistent with Grade II  diastolic dysfunction (pseudonormalization). Elevated left atrial pressure. Right Ventricle: The right ventricular size is moderately enlarged. No increase in right ventricular wall thickness. Right ventricular systolic function is moderately reduced. There is mildly elevated pulmonary artery systolic pressure. The tricuspid regurgitant velocity is 2.61 m/s, and with an assumed right atrial  pressure of 15 mmHg, the estimated right ventricular systolic pressure is 99.3 mmHg. Left Atrium: Left atrial size was moderately dilated. Right Atrium: Right atrial size was mildly dilated. Pericardium: A small pericardial effusion is present. The pericardial effusion is posterior and lateral to the left ventricle. There is no evidence of cardiac tamponade. Mitral Valve: The mitral valve is normal in structure. Normal mobility of the mitral valve leaflets. Moderate mitral valve regurgitation, with centrally-directed jet. No evidence of mitral valve stenosis. Tricuspid Valve: The tricuspid valve is normal in structure. Tricuspid valve regurgitation is moderate . No evidence of tricuspid stenosis. Aortic Valve: The aortic valve is normal in structure.. There is moderate thickening and mild calcification of the aortic valve. Aortic valve  regurgitation is mild. Mild to moderate aortic valve sclerosis/calcification is present, without any evidence of  aortic stenosis. There is moderate thickening of the aortic valve. There is mild calcification of the aortic valve. Pulmonic Valve: The pulmonic valve was normal in structure. Pulmonic valve regurgitation is not visualized. No evidence of pulmonic stenosis. Aorta: The aortic root is normal in size and structure. Venous: The inferior vena cava is normal in size with greater than 50% respiratory variability, suggesting right atrial pressure of 3 mmHg. IAS/Shunts: No atrial level shunt detected by color flow Doppler.  LEFT VENTRICLE PLAX 2D LVIDd:         4.60 cm  Diastology LVIDs:         4.20 cm  LV e' lateral:   7.29 cm/s LV PW:         1.00 cm  LV E/e' lateral: 11.6 LV IVS:        0.80 cm  LV e' medial:    3.70 cm/s LVOT diam:     1.90 cm  LV E/e' medial:  22.8 LV SV:         39 LV SV Index:   22       2D Longitudinal Strain LVOT Area:     2.84 cm 2D Strain GLS Avg:     -7.0 %  RIGHT VENTRICLE RV S prime:     9.57 cm/s TAPSE (M-mode): 1.8 cm LEFT ATRIUM             Index       RIGHT ATRIUM           Index LA diam:        4.30 cm 2.39 cm/m  RA Area:     18.45 cm LA Vol (A2C):   93.4 ml 51.82 ml/m RA Volume:   50.20 ml  27.85 ml/m LA Vol (A4C):   84.1 ml 46.63 ml/m LA Biplane Vol: 85.7 ml 47.55 ml/m  AORTIC VALVE LVOT Vmax:   77.00 cm/s LVOT Vmean:  45.900 cm/s LVOT VTI:    0.139 m  AORTA Ao Root diam: 2.90 cm Ao Asc diam:  2.60 cm MITRAL VALVE               TRICUSPID VALVE MV Area (PHT): 5.23 cm    TR Peak grad:   27.2 mmHg MV Decel Time: 145 msec    TR Vmax:        261.00 cm/s MV E velocity: 84.40 cm/s MV A velocity: 89.10 cm/s  SHUNTS MV E/A ratio:  0.95        Systemic VTI:  0.14 m  Systemic Diam: 1.90 cm Nicole Sellers Electronically signed by Nicole Sellers Signature Date/Time: 05/22/2019/1:09:35 PM    Final     Assessment and Plan:   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 -Chest x-ray and CT consistent with CHF. -Will check BNP and TSH.  -Echo showed LVEF of 15 to 20% with severe hypokinesis.  EF down from 50-55% in 03/2019. -Patient started on IV Lasix by primary team. However with CKD stage IV-V, will defer any additional diuresis to Nephrology. -No ACE/ARB/Arni/MRA due to renal function. -May need to hold off on beta-blocker at this time in setting of decompensated heart failure.  -Consider adding Hydralazine and Imdur at some point. -Given history of severe PAD, suspect etiology is likely ischemic (although low risk nuclear stress test in 06/2018). Patient is not a cath candidate given renal function with desire not to be on dialysis. However, if patient decides that she would be willing to proceed with dialysis if necessary, can consider cath.  Patient also appears to have a left bundle branch block so we will do that as well. Last dose of chemo was in 05/2018 and patient had normal EF on Echo in 03/2019 so do not think this is chemo-induced cardiomyopathy.  -Monitor daily weights, strict I's and O's, and renal function.  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 She then underwent PTA/stenting to right external iliac artery in 04/2017.  -Most recent lower extremity dopplers in 03/2019 showed patent right SFA stent and 30-40% stenosis of left common femoral artery as well as occlusion of the right peroneal artery, left SFA with reconstition in the distal segment, and left peroneal artery. Bilateral ABIs and TBIs decreased from 2019 studies.  -Continue aspirin. May need to hold home statin given elevated LFTs. -Will need follow-up with Dr. Gwenlyn Found as outpatient.  Carotid Artery Stenosis -Most recent dopplers in 06/2018 showed showed 1-39% stenosis of right ICA and 60-79% stenosis of  left ICA. -Due for follow-up dopplers soon. -Continue aspirin. May need to hold home statin given elevated LFTs.  DVT -Recently diagnosed with DVT in 11/2018 but not started on treatment due to loss of follow-up.  Started on Eliquis 5 mg twice daily during hospitalization in 03/2019. -Continue Eliquis.  Hypertension -BP mostly well controlled without any antihypertensives. -Continue to monitor.  Hyperlipidemia - Currently on home Lipitor '10mg'$  daily. However, LFTs elevated possibly to to hepatic congestion. May need to hold statin. Will defer to primary team.  Type 2 Diabetes Mellitus - Management per primary team.  CKD Stage IV-V -Creatinine 3.29 on admission 3.16 today which is around recent baseline. -BUN 67. -Patient has previously refused dialysis.  However, she told admitting provider that if it was dialysis versus death, she would be okay with dialysis.  During my conversation with her, she seems to be on the fence about dialysis. -Management per Nephrology.  Elevated LFTs -AST 101, ALT 187, Alk Phos 159 on admission. Slowly coming down today. -Lipase normal. -Hepatitis panel non-reactive. -Suspect secondary to hepatic congestion or shock from acute CHF. -Continue to monitor.  Chronic Anemia -Hemoglobin stable at 7.8. -Felt to be due to anemia of chronic disease secondary to CKD in the past.   -She has refused blood transfusions in the past. -Management per primary team.  Otherwise, per primary team.  For questions or updates, please contact Nicole Sellers  Nicole Sellers Please consult [email or web address] for contact info under     Signed, Nicole Mclean, Nicole Sellers  05/22/2019 1:27 PM  ---------------------------------------------------------------------------------------------   History and all data above reviewed.  Patient examined.  I agree with the findings as above.  Nicole Sellers is a 69 year old female with a history of significant peripheral arterial disease status post  intervention of the right SFA and right external iliac artery, with history of amputation for ischemic toe, known carotid artery disease, rectal cancer status post colectomy and chemotherapy.  We are consulted for newly reduced ejection fraction of 15% with newly recognized left bundle branch block, first identified on EKG 10/01/2018.  She was admitted for acute decompensated heart failure.  She has known significant renal failure and follows in nephrology.  Patient has previously declined dialysis, however more recently may be open to it if absolutely necessary.  She is sleepy but appears oriented and does not appear frankly uremic.  She denies chest pain, and states her shortness of breath is improved.  She feels her leg swelling is "a whole lot better".  She has been diuresed with Lasix since admission but has only had 250 mLs of output per her nurse.  Constitutional: No acute distress, sleepy Cardiovascular: regular rhythm, normal rate, no murmurs. S1 and S2 normal. Radial pulses normal bilaterally.  External jugular vein is noted to be elevated to the mid one third of the neck at 45 degrees, IJ is not well appreciated Respiratory: Crackles in bilateral bases GI : normal bowel sounds, soft and nontender. No distention.   MSK: extremities warm, well perfused.  Trace edema.  NEURO: grossly nonfocal exam, moves all extremities. PSYCH: Drowsy but oriented  All available labs, radiology testing, previous records reviewed. Agree with documented assessment and plan of my colleague as stated above with the following additions or changes:  Principal Problem:   New onset of congestive heart failure ([place]) Active Problems:   Non-insulin treated type 2 diabetes mellitus (HCC)   Rectal cancer (HCC)   DVT (deep venous thrombosis) (HCC)   CKD (chronic kidney disease) stage 4, GFR 15-29 ml/min (HCC)   Fluid overload   Anemia of chronic disease   Transaminitis    Plan:  The patient is not tremendously  engaged in our conversation due to being quite drowsy.  I do not believe she is frankly uremic as she answers questions appropriately.  She has a newly reduced ejection fraction and I have independently reviewed the echocardiogram images and agree that it is severely reduced.  Previously described as normal.  I have also independently reviewed the images from her nuclear stress test in 2020.  There is suggestion of a mostly fixed anterior anteroseptal defect, and this was read as low risk.  I had a frank discussion with the patient as her attentiveness would allow about likely needing coronary angiography.  I would recommend a right and left heart catheterization.  To perform this, IV contrast will need to be used.  Our challenge here is that she may require dialysis if IV contrast causes worsening of renal function.  She is not yet committed to the idea of dialysis and is being seen in [place] by nephrology.  She did not have an adequate response to Lasix IV thus far, nephrology is performing a Lasix challenge.  We will follow along with nephrology and primary service for further recommendations, but my sense is she will need an ischemic work-up.  At this point nuclear stress testing and coronary CTA  would not be helpful, the next best step would be coronary angiography.  Her last chemotherapy dose was 1 year ago, it is unlikely that this is a late effect of chemotherapy, however if there is no obstructive CAD, would consider this as an etiology.  She appears better compensated today then her story would suggest on presentation, will initiate low-dose beta-blockade for treatment of heart failure.  ACE inhibitor and ARB contraindicated with current renal dysfunction.  Entresto contraindicated for the same.  Length of Stay:  LOS: 1 day   Nicole Munroe, Nicole Sellers Nicole Sellers 3:38 PM  05/22/2019

## 2019-05-22 NOTE — Consult Note (Addendum)
Micro Nurse ostomy follow up Consult requested for ostomy supply assistance. Pt is familiar to [facility] team from previous admissions and states she is independent with pouch application and emptying prior to admission.  She had ileostomy surgery performed in 07/2018. Pt states she applied the current pouch yesterday and it is intact with good seal. Stoma is red and viable when visualized through the pouch. Mod amt brown liquid stool.  She uses a one piece convex pouch and no barrier ring.  2 extra convex pouches left in the room for patient or staff nurse use. She denies further questions or need for assistance.  Please re-consult if further assistance is needed.  Thank-you,  Julien Girt MSN, [place], [place], [place], [place]

## 2019-05-22 NOTE — Consult Note (Signed)
Nicole Sellers Admit Date: 05/21/2019 05/22/2019 Rexene Agent Requesting Physician:  Cruzita Lederer MD  Reason for Consult:  CKD4, Anemia, Hypervolemia HPI:  33F admitted from the ER yesterday after presenting with weakness, fatigue, increased dyspnea.  She has a past medical history is complicated and includes:  History of stage III colorectal cancer status post radiation and resection with long-term ileostomy, in remission  PAD with history of stents  History of DVT on apixaban  DM2 not on insulin  Hypertension  Chronic diastolic heart failure  Chronic lower extremity edema likely as a consequence of CKD, hypoalbuminemia, PAD, history of DVT  She was admitted to the hospital, [facility], in March of this year with AKI.  Work-up was without obstruction, bland urine.  GFR and creatinine slowly improved and was 3.3 at discharge.  She has significant anemia at that time, declined transfusion, and did receive ESA therapy.  She has since followed with Dr. Johnney Ou in our office, being last seen on 04/30/2019.  At that visit her creatinine was 2.6, potassium 5.6, hemoglobin 7.5 starting her on ESA therapy and IV iron was initiated and she was to go on 5/7 but felt too unwell to attend and rescheduled to later this week.  Further, she discussed with Dr. Johnney Ou dialysis and while being very averse to it would choose to receive it if it was either that or death and he can provide a decent quality of life.  For her chronic hyperkalemia she was continued on Lokelma.  She was taking Lasix 60 mg every other day.  When she presented to the ER yesterday her main complaint was weakness.  Hemoglobin was 7.4, creatinine 3.3, BUN 73, K4.1, bicarbonate 17.  She had CT of the chest abdomen and pelvis without contrast.  Findings included pulmonary edema, bilateral small pleural effusions.  No evidence of obstruction or acute structural issues of the collecting system.  She was admitted, placed on IV Lasix  (current dosing of 60 mg once daily), and we have been consulted.  I/Os: I/O last 3 completed shifts: In: 30 [P.O.:60] Out: -    ROS NSAIDS: No identified exposure IV Contrast no exposure TMP/SMX no exposure Hypotension not present Balance of 12 systems is negative w/ exceptions as above  PMH  Past Medical History:  Diagnosis Date  . Age-related nuclear cataract, right eye   . Allergy   . Anemia   . Arthritis    "joints sometimes" (12/25/2012)  . Clotting disorder ([place])   . Colon cancer ([place])   . Critical lower limb ischemia   . Diverticulosis   . Gangrene of toe, Rt second toe 12/25/2012  . GERD (gastroesophageal reflux disease)   . High cholesterol   . Hypertension   . Hypothyroidism   . PAD (peripheral artery disease) ([place])   . PAD (peripheral artery disease) ([place])   . Peripheral neuropathy   . Rectal cancer ([place])   . S/P arterial stent, 12/25/13, successful diamondback orbital rotational arthrectomy, PTA using chocolate  balloon and stenting using I DEV stent of long segment calcified high-grade proximal and mid r 12/25/2012  . Tobacco abuse   . Type II diabetes mellitus (H[facility])    PSH  Past Surgical History:  Procedure Laterality Date  . ABDOMINAL AORTAGRAM  12/20/2012   Procedure: ABDOMINAL AORTAGRAM;  Surgeon: Lorretta Harp, MD;  Location: [facility] CATH LAB;  Service: Cardiovascular;;  . ABDOMINAL HYSTERECTOMY  2000  . ANGIOPLASTY / STENTING FEMORAL Right 12/25/2012  . ATHERECTOMY Right 12/25/2012  Procedure: ATHERECTOMY;  Surgeon: Lorretta Harp, MD;  Location: [facility] CATH LAB;  Service: Cardiovascular;  Laterality: Right;  right SFA  . COLONOSCOPY    . DIVERTING ILEOSTOMY N/A 08/03/2018   Procedure: DIVERTING LOOP ILEOSTOMY;  Surgeon: Leighton Ruff, MD;  Location: [facility] ORS;  Service: General;  Laterality: N/A;  . ESOPHAGOGASTRODUODENOSCOPY N/A 10/04/2018   Procedure: ESOPHAGOGASTRODUODENOSCOPY (EGD);  Surgeon: Carol Ada, MD;  Location: [facility] ENDOSCOPY;  Service:  Endoscopy;  Laterality: N/A;  . EYE SURGERY Left    cataract  . HERNIA REPAIR    . LOWER EXTREMITY ANGIOGRAM N/A 12/20/2012   Procedure: LOWER EXTREMITY ANGIOGRAM;  Surgeon: Lorretta Harp, MD;  Location: [facility] CATH LAB;  Service: Cardiovascular;  Laterality: N/A;  . LOWER EXTREMITY ANGIOGRAM N/A 12/25/2012   Procedure: LOWER EXTREMITY ANGIOGRAM;  Surgeon: Lorretta Harp, MD;  Location: [facility] CATH LAB;  Service: Cardiovascular;  Laterality: N/A;  . LOWER EXTREMITY INTERVENTION  05/08/2017  . LOWER EXTREMITY INTERVENTION Bilateral 05/08/2017   Procedure: LOWER EXTREMITY INTERVENTION;  Surgeon: Lorretta Harp, MD;  Location: [facility] CV LAB;  Service: Cardiovascular;  Laterality: Bilateral;  . PERIPHERAL VASCULAR BALLOON ANGIOPLASTY Right 05/08/2017   Procedure: PERIPHERAL VASCULAR BALLOON ANGIOPLASTY;  Surgeon: Lorretta Harp, MD;  Location: [facility] CV LAB;  Service: Cardiovascular;  Laterality: Right;  sfa  . PERIPHERAL VASCULAR INTERVENTION Right 05/08/2017   Procedure: PERIPHERAL VASCULAR INTERVENTION;  Surgeon: Lorretta Harp, MD;  Location: [facility] CV LAB;  Service: Cardiovascular;  Laterality: Right;  ext iliac  . PORTACATH PLACEMENT Right 11/22/2017   Procedure: INSERTION PORT-A-CATH;  Surgeon: Leighton Ruff, MD;  Location: [facility] ORS;  Service: General;  Laterality: Right;  . SAVORY DILATION N/A 10/04/2018   Procedure: SAVORY DILATION;  Surgeon: Carol Ada, MD;  Location: [facility] ENDOSCOPY;  Service: Endoscopy;  Laterality: N/A;  . TOE SURGERY Right    2n toe   . UMBILICAL HERNIA REPAIR  2000  . UMBILICAL HERNIA REPAIR N/A 08/03/2018   Procedure: UMBILICAL HERNIA REPAIR;  Surgeon: Leighton Ruff, MD;  Location: [facility];  Service: General;  Laterality: N/A;  . XI ROBOTIC ASSISTED LOWER ANTERIOR RESECTION N/A 08/03/2018   Procedure: XI ROBOTIC ASSISTED LOWER ANTERIOR RESECTION; DIAGNOSTIC FLEXIBLE SIGMOIDOSCOPY; INTRAOPERATIVE ASSESSMENT OF VASCULAR PERFUSION;  Surgeon: Leighton Ruff, MD;  Location: [facility] ORS;  Service: General;  Laterality: N/A;   FH  Family History  Problem Relation Age of Onset  . Hypertension Mother   . Stroke Mother   . Cancer Father        prostate  . Mental illness Sister   . Diabetes Brother   . Hypertension Brother   . Cancer Brother 77       prostate  . Diabetes Brother   . Hypertension Brother   . Cancer Brother        "tumors on neck and body"    SH  reports that she has quit smoking. Her smoking use included cigarettes. She has a 24.00 pack-year smoking history. She has never used smokeless tobacco. She reports that she does not drink alcohol or use drugs. Allergies  Allergies  Allergen Reactions  . Kiwi Extract Itching and Other (See Comments)    Throat itching   . Lisinopril Other (See Comments)    Bruising   . Plavix [Clopidogrel] Other (See Comments)    "Made me feel badly- didn't agree with my system"   Home medications Prior to Admission medications   Medication Sig Start Date End Date Taking? Authorizing  Provider  albuterol (PROVENTIL HFA;VENTOLIN HFA) 108 (90 BASE) MCG/ACT inhaler Inhale 2 puffs into the lungs every 6 (six) hours as needed for wheezing or shortness of breath.   Yes [provider]  apixaban (ELIQUIS) 5 MG TABS tablet Take 5 mg by mouth 2 (two) times daily.   Yes [provider]  aspirin 81 MG tablet Take 81 mg by mouth daily.   Yes [provider]  atorvastatin (LIPITOR) 10 MG tablet Take 1 tablet (10 mg total) by mouth daily. Patient taking differently: Take 10 mg by mouth at bedtime.  01/01/18  Yes Minette Brine, FNP  Calcium Carbonate-Vitamin D (CALCIUM 600+D PO) Take 1 tablet by mouth daily.   Yes [provider]  cetirizine (ZYRTEC) 10 MG tablet Take 10 mg by mouth daily.   Yes [provider]  Cholecalciferol (VITAMIN D3) 50 MCG (2000 UT) TABS Take 4,000 Units by mouth daily.   Yes [provider]  FERREX 150 150 MG capsule Take 150 mg  by mouth 2 (two) times daily. 08/29/17  Yes [provider]  furosemide (LASIX) 40 MG tablet Take 60 mg by mouth daily.  05/08/19  Yes [provider]  gabapentin (NEURONTIN) 300 MG capsule Take 300 mg by mouth 2 (two) times daily.    Yes [provider]  levothyroxine (SYNTHROID, LEVOTHROID) 50 MCG tablet Take 1 tablet (50 mcg total) by mouth daily. 01/01/18  Yes Minette Brine, FNP  Multiple Vitamins-Minerals (SENIOR MULTIVITAMIN PLUS PO) Take 1 tablet by mouth daily with breakfast.   Yes [provider]  omeprazole (PRILOSEC OTC) 20 MG tablet Take 20 mg by mouth daily before breakfast.   Yes [provider]  Psyllium (METAMUCIL FIBER PO) Take 1 Scoop by mouth See admin instructions. Mix 1 scoopful into 6-8 ounces of desired beverage and drink two times a day   Yes [provider]  sitaGLIPtin (JANUVIA) 25 MG tablet Take 1 tablet (25 mg total) by mouth daily. 04/04/19  Yes Donne Hazel, MD  sodium zirconium cyclosilicate (LOKELMA) 5 g packet Take 5 g by mouth 2 (two) times daily.   Yes [provider]  tetrahydrozoline-zinc (VISINE-AC) 0.05-0.25 % ophthalmic solution Place 1 drop into both eyes 2 (two) times daily as needed (dry eyes).   Yes [provider]  traMADol (ULTRAM) 50 MG tablet Take 1 tablet (50 mg total) by mouth every 12 (twelve) hours as needed for moderate pain. 04/04/19  Yes Donne Hazel, MD  Triamcinolone Acetonide (NASACORT ALLERGY 24HR NA) Place 1 spray into both nostrils daily as needed (allergies).    Yes [provider]  Trolamine Salicylate (ASPERCREME EX) Apply 1 application topically daily as needed (to affected areas, for joint pain).    Yes [provider]  vitamin B-12 (CYANOCOBALAMIN) 500 MCG tablet Take 500 mcg by mouth daily.   Yes [provider]  apixaban (ELIQUIS) 5 MG TABS tablet Take 1 tablet (5 mg total) by mouth 2 (two) times daily. 04/04/19 05/04/19  Donne Hazel, MD    Current Medications Scheduled Meds: . apixaban  5 mg Oral BID  . aspirin  81 mg Oral Daily  . atorvastatin  10 mg Oral QHS  . Chlorhexidine Gluconate Cloth  6 each Topical Daily  . furosemide  60 mg Intravenous Daily  . gabapentin  300 mg Oral BID  . iron polysaccharides  150 mg Oral BID  . levothyroxine  50 mcg Oral Daily  . linagliptin  5  mg Oral Daily  . omeprazole  20 mg Oral Daily  . sodium chloride flush  10-40 mL Intracatheter Q12H  . sodium zirconium cyclosilicate  5 g Oral BID   Continuous Infusions: PRN Meds:.acetaminophen **OR** acetaminophen, albuterol, naphazoline-glycerin, ondansetron **OR** ondansetron (ZOFRAN) IV, sodium chloride flush, traMADol  CBC Recent Labs  Lab 05/21/19 1604 05/22/19 0500  WBC 3.2* 3.1*  NEUTROABS 2.4  --   HGB 7.4* 7.8*  HCT 24.1* 25.7*  MCV 100.8* 100.0  PLT 307 326   Basic Metabolic Panel Recent Labs  Lab 05/21/19 1604 05/22/19 0500  NA 136 138  K 4.1 3.8  CL 107 108  CO2 17* 19*  GLUCOSE 153* 167*  BUN 73* 67*  CREATININE 3.29* 3.16*  CALCIUM 8.8* 8.9    Physical Exam  Blood pressure 123/68, pulse 78, temperature 97.7 F (36.5 C), temperature source Oral, resp. rate 14, SpO2 100 %. GEN: Chronically ill-appearing female, in bed ENT: NCAT EYES: Mild temporal wasting, EOMI CV: Regular, normal S1 and S2, no rub PULM: Clear bilaterally, normal work of breathing ABD: Soft, nontender, ileostomy in right lower quadrant with liquid stool SKIN: Chronic venous stasis changes of the lower extremity with thickening, scaling, hyperpigmentation EXT: 3-4+ pitting edema of the distal lower extremities  Assessment 4F CKD4 presenting with hypervolemia, anemia, SOB/Weakness.  I think her presentation is multifactorial including a component of acute on chronic diastolic heart failure, persistent anemia, GFR probably lower than creatinine suggest based upon muscle mass with potential some uremia.  1. CKD4, likely that GFR  lower than 16 2. Acute on chronic diastolic heart failure exacerbation with hypervolemia and chronic lower extremity edema 3. Chronic anemia with some features of iron deficiency, likely driven by #1, plan was to start ESA and IV iron as outpatient but postponed to this week 4. Chronic hyperkalemia on Lokelma, K3.8 this morning 5. Chronic metabolic acidosis likely a consequence of #1 presence of ileostomy 6. Chronic ileostomy 7. Colorectal cancer status post radiation and colectomy, resolved 8. Hypertension, blood pressure stable 9. DM 2 10. History of PAD   Plan 1. Increase lasix to 120 IV TID 2. Hold Lokelma, gives sig Na load, might want to try [place] but will defer to Dr Johnney Ou; dose PRN for K >5.5 3. Aranesp 100 mcg today 4. Fereheme 510mg  IV today 5. No HD needed currently, she would possibly pursue if indicated 6. Might want to involve palliative care who sees her outpt, esp if she doesn't improve quickly 7. Daily weights, Daily Renal Panel, Strict I/Os, Avoid nephrotoxins (NSAIDs, judicious IV Contrast)    Rexene Agent  [phone] pgr 05/22/2019, 2:23 PM

## 2019-05-22 NOTE — Progress Notes (Signed)
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  She has been followed by nephrology as an outpatient and she and her nephrologist discussed about dialysis however she has been refusing dialysis.  It appears that currently she is willing to do that in the ED she was found to be fluid overloaded with pulmonary edema, small bilateral pleural effusion and lower extremity swelling.  She was placed on IV Lasix.  Subjective / 24h Interval events: Complains of a poor appetite, appreciates breathing on lower extremity swelling has improved some.  No chest pain  Assessment & Plan: Principal Problem Acute hypoxic respiratory failure in the setting of acute on chronic diastolic CHF/fluid overload-suspect kidney disease is the main driver.  She has not responded to oral furosemide as an outpatient, has been placed on IV Lasix and continue for now.  Urine output not recorded well but subjectively she appreciates that her swelling is getting better.  Continue Lasix, renal function overall stable  Active Problems Acute kidney injury on chronic kidney disease stage IV-V-nephrology consulted, discussed over the phone Dr. Hollie Salk, will evaluate.  Continue Lokelma, potassium actually appears to be fairly good today, she is not significantly acidotic.  For now seems to be stable / improving on IV diuresis but defer to nephrology further management  Anemia of chronic renal disease-continue p.o. iron, hemoglobin overall stable  Elevated  LFTs-possible congestion from fluid overload, monitor CMP  History of DVT-continue Eliquis  Rectal cancer-appears to be in remission  DM2-continue Januvia  Scheduled Meds: . apixaban  5 mg Oral BID  . aspirin  81 mg Oral Daily  . atorvastatin  10 mg Oral QHS  . Chlorhexidine Gluconate Cloth  6 each Topical Daily  . furosemide  60 mg Intravenous Daily  . gabapentin  300 mg Oral BID  . iron polysaccharides  150 mg Oral BID  . levothyroxine  50 mcg Oral Daily  . linagliptin  5 mg Oral Daily  . omeprazole  20 mg Oral Daily  . sodium chloride flush  10-40 mL Intracatheter Q12H  . sodium zirconium cyclosilicate  5 g Oral BID   Continuous Infusions: PRN Meds:.acetaminophen **OR** acetaminophen, albuterol, naphazoline-glycerin, ondansetron **OR** ondansetron (ZOFRAN) IV, sodium chloride flush, traMADol  DVT prophylaxis: Eliquis  Code Status: DNR Family Communication: no family at bedside   Status is: Inpatient  Remains inpatient appropriate because:Inpatient level of care appropriate due to severity of illness   Dispo: The patient is from: Home              Anticipated d/c is to: Home              Anticipated d/c date is: 3 days              Patient currently is not medically stable to d/c.  Consultants:  Nephrology   Procedures:  None   Microbiology  None   Antimicrobials: None     Objective: Vitals:   05/22/19 0217 05/22/19 0557 05/22/19 0953 05/22/19 1010  BP: 131/88 101/86 (!) 87/66 123/73  Pulse: 87 81 86  90  Resp: 20 17 14    Temp: 97.8 F (36.6 C) (!) 97.5 F (36.4 C) 98.1 F (36.7 C)   TempSrc: Oral Oral Oral   SpO2: 100% (!) 75% 100%     Intake/Output Summary (Last 24 hours) at 05/22/2019 1111 Last data filed at 05/22/2019 1000 Gross per 24 hour  Intake 300 ml  Output 375 ml  Net -75 ml   There were no vitals filed for this visit.  Examination:  Constitutional: NAD Eyes: no scleral icterus ENMT: Mucous membranes are moist.  Neck: normal,  supple Respiratory: Faint bibasilar crackles, no wheezing.  Increased respiratory effort Cardiovascular: Regular rate and rhythm, no murmurs / rubs / gallops.  1-2+ LE edema.  Abdomen: non distended, no tenderness. Bowel sounds positive.  Musculoskeletal: no clubbing / cyanosis.  Skin: no rashes Neurologic: non focal  Data Reviewed: I have independently reviewed following labs and imaging studies   CBC: Recent Labs  Lab 05/21/19 1604 05/22/19 0500  WBC 3.2* 3.1*  NEUTROABS 2.4  --   HGB 7.4* 7.8*  HCT 24.1* 25.7*  MCV 100.8* 100.0  PLT 307 226   Basic Metabolic Panel: Recent Labs  Lab 05/21/19 1604 05/21/19 2042 05/22/19 0500  NA 136  --  138  K 4.1  --  3.8  CL 107  --  108  CO2 17*  --  19*  GLUCOSE 153*  --  167*  BUN 73*  --  67*  CREATININE 3.29*  --  3.16*  CALCIUM 8.8*  --  8.9  MG  --  1.9  --    Liver Function Tests: Recent Labs  Lab 05/21/19 1604 05/22/19 0500  AST 101* 85*  ALT 187* 169*  ALKPHOS 159* 143*  BILITOT 0.6 0.5  PROT 6.2* 6.4*  ALBUMIN 3.2* 3.3*   Coagulation Profile: No results for input(s): INR, PROTIME in the last 168 hours. HbA1C: No results for input(s): HGBA1C in the last 72 hours. CBG: Recent Labs  Lab 05/21/19 1532  GLUCAP 137*    Recent Results (from the past 240 hour(s))  SARS Coronavirus 2 by RT PCR (hospital order, performed in [facility] hospital lab) Nasopharyngeal Nasopharyngeal Swab     Status: None   Collection Time: 05/21/19  5:33 PM   Specimen: Nasopharyngeal Swab  Result Value Ref Range Status   SARS Coronavirus 2 NEGATIVE NEGATIVE Final    Comment: (NOTE) SARS-CoV-2 target nucleic acids are NOT DETECTED. The SARS-CoV-2 RNA is generally detectable in upper and lower respiratory specimens during the acute phase of infection. The lowest concentration of SARS-CoV-2 viral copies this assay can detect is 250 copies / mL. A negative result does not preclude SARS-CoV-2 infection and should not be used as the  sole basis for treatment or other patient management decisions.  A negative result may occur with improper specimen collection / handling, submission of specimen other than nasopharyngeal swab, presence of viral mutation(s) within the areas targeted by this assay, and inadequate number of viral copies (<250 copies / mL). A negative result must be combined with clinical observations, patient history, and epidemiological information. Fact Sheet for Patients:   StrictlyIdeas.no Fact Sheet for Healthcare Providers: BankingDealers.co.za This test is not yet approved or cleared  by the [place] FDA and has been authorized for detection and/or diagnosis of SARS-CoV-2 by FDA under an Emergency Use Authorization (EUA).  This EUA will remain in effect (meaning this test can be used) for the duration of the COVID-19 declaration under Section  564(b)(1) of the Act, 21 U.S.C. section 360bbb-3(b)(1), unless the authorization is terminated or revoked sooner. Performed at [facility], [place] [address] [place], [place] [address]      Radiology Studies: CT ABDOMEN PELVIS WO CONTRAST  Result Date: 05/21/2019 CLINICAL DATA:  Pleural effusions, jaundice and weakness for 3 days. EXAM: CT CHEST, ABDOMEN AND PELVIS WITHOUT CONTRAST TECHNIQUE: Multidetector CT imaging of the chest, abdomen and pelvis was performed following the standard protocol without IV contrast. COMPARISON:  CT abdomen and pelvis 03/30/2019. FINDINGS: CT CHEST FINDINGS Cardiovascular: Cardiomegaly. Calcific aortic and coronary atherosclerosis. No pericardial effusion. No aortic aneurysm. Mediastinum/Nodes: No enlarged mediastinal, hilar, or axillary lymph nodes. Thyroid gland, trachea, and esophagus demonstrate no significant findings. Lungs/Pleura: Small bilateral pleural effusions. There is interlobular septal thickening and scattered foci of mild ground-glass attenuation.  No consolidative process, nodule or mass. Mild compressive atelectasis in the bases noted. Musculoskeletal: No acute bony abnormality. CT ABDOMEN PELVIS FINDINGS Hepatobiliary: No focal liver abnormality is seen. No gallstones, gallbladder wall thickening, or biliary dilatation. Pancreas: Unremarkable. No pancreatic ductal dilatation or surrounding inflammatory changes. Spleen: Normal in size without focal abnormality. Adrenals/Urinary Tract: The right kidney appears normal. 0.6 cm calcification upper pole left kidney is compatible with a nonobstructing stone. Ureters and urinary bladder appear normal. Stomach/Bowel: The patient is status post low anterior resection. 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 There is some sigmoid diverticulosis. The appendix appears normal. No evidence of bowel obstruction. Stomach is unremarkable Vascular/Lymphatic: Aortic atherosclerosis. No enlarged abdominal or pelvic lymph nodes. Reproductive: Status post hysterectomy. No adnexal masses. Other: None. Musculoskeletal: No acute or focal abnormality. IMPRESSION: Interlobular septal thickening in the chest consistent with pulmonary edema. Associated small bilateral pleural effusions and cardiomegaly noted. No acute abnormality abdomen or pelvis. Status post low anterior resection with an ileostomy in place. Mild diverticulosis without diverticulitis noted. Aortic Atherosclerosis (ICD10-I70.0). Calcific coronary artery disease is also seen. Electronically Signed   By: Inge Rise M.D.   On: 05/21/2019 18:59   DG Chest 2 View  Result Date: 05/21/2019 CLINICAL DATA:  Weakness x3 days. EXAM: CHEST - 2 VIEW COMPARISON:  March 30, 2019 FINDINGS: There is stable right-sided venous Port-A-Cath positioning. Mild to moderate severity diffusely increased lung markings are seen which is increased in severity when compared to the prior study. Mild areas of atelectasis and/or infiltrate are seen within the bilateral lung bases. There are small bilateral  pleural effusions. No pneumothorax is identified. The cardiac silhouette is markedly enlarged. This is increased in size when compared to the prior study. The visualized skeletal structures are unremarkable. IMPRESSION: 1. Findings consistent with mild to moderate congestive heart failure. 2. Mild bibasilar atelectasis and/or infiltrate. 3. Small bilateral pleural effusions. 4. Interval enlargement of the cardiac silhouette since the prior chest plain film dated March 30, 2019. The presence of a pericardial effusion cannot be excluded. Electronically Signed   By: Virgina Norfolk M.D.   On: 05/21/2019 18:16   CT Chest Wo Contrast  Result Date: 05/21/2019 CLINICAL DATA:  Pleural effusions, jaundice and weakness for 3 days. EXAM: CT CHEST, ABDOMEN AND PELVIS WITHOUT CONTRAST TECHNIQUE: Multidetector CT imaging of the chest, abdomen and pelvis was performed following the standard protocol without IV contrast. COMPARISON:  CT abdomen and pelvis 03/30/2019. FINDINGS: CT CHEST FINDINGS Cardiovascular: Cardiomegaly. Calcific aortic and coronary atherosclerosis. No pericardial effusion. No aortic aneurysm. Mediastinum/Nodes: No enlarged mediastinal, hilar, or axillary lymph nodes. Thyroid gland, trachea, and esophagus demonstrate no significant findings. Lungs/Pleura: Small bilateral pleural  effusions. There is interlobular septal thickening and scattered foci of mild ground-glass attenuation. No consolidative process, nodule or mass. Mild compressive atelectasis in the bases noted. Musculoskeletal: No acute bony abnormality. CT ABDOMEN PELVIS FINDINGS Hepatobiliary: No focal liver abnormality is seen. No gallstones, gallbladder wall thickening, or biliary dilatation. Pancreas: Unremarkable. No pancreatic ductal dilatation or surrounding inflammatory changes. Spleen: Normal in size without focal abnormality. Adrenals/Urinary Tract: The right kidney appears normal. 0.6 cm calcification upper pole left kidney is  compatible with a nonobstructing stone. Ureters and urinary bladder appear normal. Stomach/Bowel: The patient is status post low anterior resection. 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 There is some sigmoid diverticulosis. The appendix appears normal. No evidence of bowel obstruction. Stomach is unremarkable Vascular/Lymphatic: Aortic atherosclerosis. No enlarged abdominal or pelvic lymph nodes. Reproductive: Status post hysterectomy. No adnexal masses. Other: None. Musculoskeletal: No acute or focal abnormality. IMPRESSION: Interlobular septal thickening in the chest consistent with pulmonary edema. Associated small bilateral pleural effusions and cardiomegaly noted. No acute abnormality abdomen or pelvis. Status post low anterior resection with an ileostomy in place. Mild diverticulosis without diverticulitis noted. Aortic Atherosclerosis (ICD10-I70.0). Calcific coronary artery disease is also seen. Electronically Signed   By: Inge Rise M.D.   On: 05/21/2019 18:59   Marzetta Board, MD, PhD [facility]  Between 7 am - 7 pm I am available, please contact me via Amion or Securechat  Between 7 pm - 7 am I am not available, please contact night coverage MD/APP via Amion

## 2019-05-23 LAB — COMPREHENSIVE METABOLIC PANEL
ALT: 136 U/L — ABNORMAL HIGH (ref 0–44)
AST: 57 U/L — ABNORMAL HIGH (ref 15–41)
Albumin: 3.1 g/dL — ABNORMAL LOW (ref 3.5–5.0)
Alkaline Phosphatase: 147 U/L — ABNORMAL HIGH (ref 38–126)
Anion gap: 10 (ref 5–15)
BUN: 64 mg/dL — ABNORMAL HIGH (ref 8–23)
CO2: 22 mmol/L (ref 22–32)
Calcium: 8.7 mg/dL — ABNORMAL LOW (ref 8.9–10.3)
Chloride: 105 mmol/L (ref 98–111)
Creatinine, Ser: 2.81 mg/dL — ABNORMAL HIGH (ref 0.44–1.00)
GFR calc Af Amer: 19 mL/min — ABNORMAL LOW (ref >60)
GFR calc non Af Amer: 16 mL/min — ABNORMAL LOW (ref >60)
Glucose, Bld: 184 mg/dL — ABNORMAL HIGH (ref 70–99)
Potassium: 3.4 mmol/L — ABNORMAL LOW (ref 3.5–5.1)
Sodium: 137 mmol/L (ref 135–145)
Total Bilirubin: 0.6 mg/dL (ref 0.3–1.2)
Total Protein: 6.1 g/dL — ABNORMAL LOW (ref 6.5–8.1)

## 2019-05-23 LAB — LIPID PANEL
Cholesterol: 113 mg/dL (ref 0–200)
HDL: 32 mg/dL — ABNORMAL LOW (ref >40)
LDL Cholesterol: 53 mg/dL (ref 0–99)
Total CHOL/HDL Ratio: 3.5 RATIO
Triglycerides: 140 mg/dL (ref <150)
VLDL: 28 mg/dL (ref 0–40)

## 2019-05-23 LAB — CBC
HCT: 26.9 % — ABNORMAL LOW (ref 36.0–46.0)
Hemoglobin: 8.1 g/dL — ABNORMAL LOW (ref 12.0–15.0)
MCH: 30.7 pg (ref 26.0–34.0)
MCHC: 30.1 g/dL (ref 30.0–36.0)
MCV: 101.9 fL — ABNORMAL HIGH (ref 80.0–100.0)
Platelets: 324 10*3/uL (ref 150–400)
RBC: 2.64 MIL/uL — ABNORMAL LOW (ref 3.87–5.11)
RDW: 18.1 % — ABNORMAL HIGH (ref 11.5–15.5)
WBC: 2.9 10*3/uL — ABNORMAL LOW (ref 4.0–10.5)
nRBC: 2 % — ABNORMAL HIGH (ref 0.0–0.2)

## 2019-05-23 NOTE — Progress Notes (Signed)
[facility] Progress Note    Assessment/ Plan:   6F CKD4 presenting with hypervolemia, anemia, SOB/Weakness.  I think her presentation is multifactorial including a component of acute on chronic diastolic heart failure, persistent anemia, GFR probably lower than creatinine suggest based upon muscle mass with potential some uremia.  1. CKD4, likely that GFR lower than 16 2. Acute on chronic diastolic heart failure exacerbation with hypervolemia and chronic lower extremity edema 3. Chronic anemia with some features of iron deficiency, likely driven by #1, plan was to start ESA and IV iron as outpatient but postponed to this week 4. Chronic hyperkalemia on Lokelma, K3.8 this morning 5. Chronic metabolic acidosis likely a consequence of #1 presence of ileostomy 6. Chronic ileostomy 7. Colorectal cancer status post radiation and colectomy, resolved 8. Hypertension, blood pressure stable 9. DM 2 10. History of PAD   Plan 1. Increase lasix to 120 IV TID--> good diuresis, continue for today Cr is improving to 2.8 today 2. Hold Lokelma, for now 3. S/p Aranesp 100 mcg 5/13 4. Fereheme 510mg  IV 5/13 5. No HD needed currently, she would possibly pursue if indicated 6. Would consult palliative to clarify [place] 7. Daily weights, Daily Renal Panel, Strict I/Os, Avoid nephrotoxins (NSAIDs, judicious IV Contrast)   Subjective:    On lasix 120 mg IV TID.  Good diuresis, weights are down.  Cr improving.  Pt has no complaints, sleepy, doesn't feel like talking.   Objective:   BP 118/62   Pulse 78   Temp 97.7 F (36.5 C) (Oral)   Resp 16   Ht 5\' 6"  (1.676 m)   Wt 69.4 kg   SpO2 94%   BMI 24.71 kg/m   Intake/Output Summary (Last 24 hours) at 05/23/2019 1247 Last data filed at 05/23/2019 0930 Gross per 24 hour  Intake 679 ml  Output 2100 ml  Net -1421 ml   Weight change:   Physical Exam: Gen: NAD CVS: RRR Resp: wearing O2, some muffled basilar sounds Abd: soft Ext: 2+  edema, some skin wrinkling, improving  Imaging: CT ABDOMEN PELVIS WO CONTRAST  Result Date: 05/21/2019 CLINICAL DATA:  Pleural effusions, jaundice and weakness for 3 days. EXAM: CT CHEST, ABDOMEN AND PELVIS WITHOUT CONTRAST TECHNIQUE: Multidetector CT imaging of the chest, abdomen and pelvis was performed following the standard protocol without IV contrast. COMPARISON:  CT abdomen and pelvis 03/30/2019. FINDINGS: CT CHEST FINDINGS Cardiovascular: Cardiomegaly. Calcific aortic and coronary atherosclerosis. No pericardial effusion. No aortic aneurysm. Mediastinum/Nodes: No enlarged mediastinal, hilar, or axillary lymph nodes. Thyroid gland, trachea, and esophagus demonstrate no significant findings. Lungs/Pleura: Small bilateral pleural effusions. There is interlobular septal thickening and scattered foci of mild ground-glass attenuation. No consolidative process, nodule or mass. Mild compressive atelectasis in the bases noted. Musculoskeletal: No acute bony abnormality. CT ABDOMEN PELVIS FINDINGS Hepatobiliary: No focal liver abnormality is seen. No gallstones, gallbladder wall thickening, or biliary dilatation. Pancreas: Unremarkable. No pancreatic ductal dilatation or surrounding inflammatory changes. Spleen: Normal in size without focal abnormality. Adrenals/Urinary Tract: The right kidney appears normal. 0.6 cm calcification upper pole left kidney is compatible with a nonobstructing stone. Ureters and urinary bladder appear normal. Stomach/Bowel: The patient is status post low anterior resection. 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 There is some sigmoid diverticulosis. The appendix appears normal. No evidence of bowel obstruction. Stomach is unremarkable Vascular/Lymphatic: Aortic atherosclerosis. No enlarged abdominal or pelvic lymph nodes. Reproductive: Status post hysterectomy. No adnexal masses. Other: None. Musculoskeletal: No acute or focal abnormality. IMPRESSION: Interlobular septal thickening  in the chest  consistent with pulmonary edema. Associated small bilateral pleural effusions and cardiomegaly noted. No acute abnormality abdomen or pelvis. Status post low anterior resection with an ileostomy in place. Mild diverticulosis without diverticulitis noted. Aortic Atherosclerosis (ICD10-I70.0). Calcific coronary artery disease is also seen. Electronically Signed   By: Inge Rise M.D.   On: 05/21/2019 18:59   DG Chest 2 View  Result Date: 05/21/2019 CLINICAL DATA:  Weakness x3 days. EXAM: CHEST - 2 VIEW COMPARISON:  March 30, 2019 FINDINGS: There is stable right-sided venous Port-A-Cath positioning. Mild to moderate severity diffusely increased lung markings are seen which is increased in severity when compared to the prior study. Mild areas of atelectasis and/or infiltrate are seen within the bilateral lung bases. There are small bilateral pleural effusions. No pneumothorax is identified. The cardiac silhouette is markedly enlarged. This is increased in size when compared to the prior study. The visualized skeletal structures are unremarkable. IMPRESSION: 1. Findings consistent with mild to moderate congestive heart failure. 2. Mild bibasilar atelectasis and/or infiltrate. 3. Small bilateral pleural effusions. 4. Interval enlargement of the cardiac silhouette since the prior chest plain film dated March 30, 2019. The presence of a pericardial effusion cannot be excluded. Electronically Signed   By: Virgina Norfolk M.D.   On: 05/21/2019 18:16   CT Chest Wo Contrast  Result Date: 05/21/2019 CLINICAL DATA:  Pleural effusions, jaundice and weakness for 3 days. EXAM: CT CHEST, ABDOMEN AND PELVIS WITHOUT CONTRAST TECHNIQUE: Multidetector CT imaging of the chest, abdomen and pelvis was performed following the standard protocol without IV contrast. COMPARISON:  CT abdomen and pelvis 03/30/2019. FINDINGS: CT CHEST FINDINGS Cardiovascular: Cardiomegaly. Calcific aortic and coronary atherosclerosis. No pericardial  effusion. No aortic aneurysm. Mediastinum/Nodes: No enlarged mediastinal, hilar, or axillary lymph nodes. Thyroid gland, trachea, and esophagus demonstrate no significant findings. Lungs/Pleura: Small bilateral pleural effusions. There is interlobular septal thickening and scattered foci of mild ground-glass attenuation. No consolidative process, nodule or mass. Mild compressive atelectasis in the bases noted. Musculoskeletal: No acute bony abnormality. CT ABDOMEN PELVIS FINDINGS Hepatobiliary: No focal liver abnormality is seen. No gallstones, gallbladder wall thickening, or biliary dilatation. Pancreas: Unremarkable. No pancreatic ductal dilatation or surrounding inflammatory changes. Spleen: Normal in size without focal abnormality. Adrenals/Urinary Tract: The right kidney appears normal. 0.6 cm calcification upper pole left kidney is compatible with a nonobstructing stone. Ureters and urinary bladder appear normal. Stomach/Bowel: The patient is status post low anterior resection. 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 There is some sigmoid diverticulosis. The appendix appears normal. No evidence of bowel obstruction. Stomach is unremarkable Vascular/Lymphatic: Aortic atherosclerosis. No enlarged abdominal or pelvic lymph nodes. Reproductive: Status post hysterectomy. No adnexal masses. Other: None. Musculoskeletal: No acute or focal abnormality. IMPRESSION: Interlobular septal thickening in the chest consistent with pulmonary edema. Associated small bilateral pleural effusions and cardiomegaly noted. No acute abnormality abdomen or pelvis. Status post low anterior resection with an ileostomy in place. Mild diverticulosis without diverticulitis noted. Aortic Atherosclerosis (ICD10-I70.0). Calcific coronary artery disease is also seen. Electronically Signed   By: Inge Rise M.D.   On: 05/21/2019 18:59   ECHOCARDIOGRAM COMPLETE  Result Date: 05/22/2019    ECHOCARDIOGRAM REPORT   Patient Name:   Nicole Sellers Date of  Exam: 05/22/2019 Medical Rec #:  [id number]        Height:       66.5 in Accession #:    [id number]       Weight:       154.5 lb  Date of Birth:  1950/01/17        BSA:          1.802 m Patient Age:    6 years         BP:           123/73 mmHg Patient Gender: F                HR:           84 bpm. Exam Location:  Inpatient Procedure: 2D Echo, 3D Echo, Color Doppler, Cardiac Doppler and Strain Analysis Indications:    N02.72 Acute diastolic (congestive) heart failure  History:        Patient has prior history of Echocardiogram examinations, most                 recent 03/31/2019. Risk Factors:Hypertension, Diabetes and                 Dyslipidemia.  Sonographer:    Raquel Sarna Senior RDCS Referring Phys: [phone] JARED M GARDNER  Sonographer Comments: Patient scanned sitting up due to dyspnea. IMPRESSIONS  1. RVEF is moderately decreased.  2. Since the last study on 03/31/2019 there has been a significant change, LVEF has decreased from 50-55% (with akinesis in the basal and mid anteroseptal and anterior walls) to 15-20% with severe diffuse hypokinesis and paradoxical septal motion.  3. There is no thrombus on the left ventricle (on Definity echo contrast imaging).  4. Left ventricular ejection fraction, by estimation, is 20 to 25%. The left ventricle has severely decreased function. The left ventricle demonstrates global hypokinesis. The left ventricular internal cavity size was moderately dilated. Left ventricular diastolic parameters are consistent with Grade II diastolic dysfunction (pseudonormalization). Elevated left atrial pressure. The average left ventricular global longitudinal strain is -7.0 %.  5. Right ventricular systolic function is moderately reduced. The right ventricular size is moderately enlarged. There is mildly elevated pulmonary artery systolic pressure. The estimated right ventricular systolic pressure is 44.0 mmHg.  6. Left atrial size was moderately dilated.  7. Right atrial size was mildly dilated.  8.  The mitral valve is normal in structure. Moderate mitral valve regurgitation. No evidence of mitral stenosis.  9. Tricuspid valve regurgitation is moderate. 10. The aortic valve is normal in structure. Aortic valve regurgitation is mild. Mild to moderate aortic valve sclerosis/calcification is present, without any evidence of aortic stenosis. 11. The inferior vena cava is normal in size with greater than 50% respiratory variability, suggesting right atrial pressure of 3 mmHg. FINDINGS  Left Ventricle: Left ventricular ejection fraction, by estimation, is 20 to 25%. The left ventricle has severely decreased function. The left ventricle demonstrates global hypokinesis. The average left ventricular global longitudinal strain is -7.0 %. The left ventricular internal cavity size was moderately dilated. There is no left ventricular hypertrophy. Abnormal (paradoxical) septal motion, consistent with left bundle branch block. Left ventricular diastolic parameters are consistent with Grade II  diastolic dysfunction (pseudonormalization). Elevated left atrial pressure. Right Ventricle: The right ventricular size is moderately enlarged. No increase in right ventricular wall thickness. Right ventricular systolic function is moderately reduced. There is mildly elevated pulmonary artery systolic pressure. The tricuspid regurgitant velocity is 2.61 m/s, and with an assumed right atrial pressure of 15 mmHg, the estimated right ventricular systolic pressure is 34.7 mmHg. Left Atrium: Left atrial size was moderately dilated. Right Atrium: Right atrial size was mildly dilated. Pericardium: A small pericardial effusion is present. The pericardial effusion is posterior and  lateral to the left ventricle. There is no evidence of cardiac tamponade. Mitral Valve: The mitral valve is normal in structure. Normal mobility of the mitral valve leaflets. Moderate mitral valve regurgitation, with centrally-directed jet. No evidence of mitral valve  stenosis. Tricuspid Valve: The tricuspid valve is normal in structure. Tricuspid valve regurgitation is moderate . No evidence of tricuspid stenosis. Aortic Valve: The aortic valve is normal in structure.. There is moderate thickening and mild calcification of the aortic valve. Aortic valve regurgitation is mild. Mild to moderate aortic valve sclerosis/calcification is present, without any evidence of  aortic stenosis. There is moderate thickening of the aortic valve. There is mild calcification of the aortic valve. Pulmonic Valve: The pulmonic valve was normal in structure. Pulmonic valve regurgitation is not visualized. No evidence of pulmonic stenosis. Aorta: The aortic root is normal in size and structure. Venous: The inferior vena cava is normal in size with greater than 50% respiratory variability, suggesting right atrial pressure of 3 mmHg. IAS/Shunts: No atrial level shunt detected by color flow Doppler.  LEFT VENTRICLE PLAX 2D LVIDd:         4.60 cm  Diastology LVIDs:         4.20 cm  LV e' lateral:   7.29 cm/s LV PW:         1.00 cm  LV E/e' lateral: 11.6 LV IVS:        0.80 cm  LV e' medial:    3.70 cm/s LVOT diam:     1.90 cm  LV E/e' medial:  22.8 LV SV:         39 LV SV Index:   22       2D Longitudinal Strain LVOT Area:     2.84 cm 2D Strain GLS Avg:     -7.0 %  RIGHT VENTRICLE RV S prime:     9.57 cm/s TAPSE (M-mode): 1.8 cm LEFT ATRIUM             Index       RIGHT ATRIUM           Index LA diam:        4.30 cm 2.39 cm/m  RA Area:     18.45 cm LA Vol (A2C):   93.4 ml 51.82 ml/m RA Volume:   50.20 ml  27.85 ml/m LA Vol (A4C):   84.1 ml 46.63 ml/m LA Biplane Vol: 85.7 ml 47.55 ml/m  AORTIC VALVE LVOT Vmax:   77.00 cm/s LVOT Vmean:  45.900 cm/s LVOT VTI:    0.139 m  AORTA Ao Root diam: 2.90 cm Ao Asc diam:  2.60 cm MITRAL VALVE               TRICUSPID VALVE MV Area (PHT): 5.23 cm    TR Peak grad:   27.2 mmHg MV Decel Time: 145 msec    TR Vmax:        261.00 cm/s MV E velocity: 84.40 cm/s MV A  velocity: 89.10 cm/s  SHUNTS MV E/A ratio:  0.95        Systemic VTI:  0.14 m                            Systemic Diam: 1.90 cm Ena Dawley MD Electronically signed by Ena Dawley MD Signature Date/Time: 05/22/2019/1:09:35 PM    Final     Labs: BMET Recent Labs  Lab 05/21/19 1604 05/22/19 0500 05/23/19 0307  NA  136 138 137  K 4.1 3.8 3.4*  CL 107 108 105  CO2 17* 19* 22  GLUCOSE 153* 167* 184*  BUN 73* 67* 64*  CREATININE 3.29* 3.16* 2.81*  CALCIUM 8.8* 8.9 8.7*   CBC Recent Labs  Lab 05/21/19 1604 05/22/19 0500 05/23/19 0307  WBC 3.2* 3.1* 2.9*  NEUTROABS 2.4  --   --   HGB 7.4* 7.8* 8.1*  HCT 24.1* 25.7* 26.9*  MCV 100.8* 100.0 101.9*  PLT 307 319 324    Medications:    . apixaban  5 mg Oral BID  . aspirin  81 mg Oral Daily  . atorvastatin  10 mg Oral QHS  . Chlorhexidine Gluconate Cloth  6 each Topical Daily  . darbepoetin (ARANESP) injection - NON-DIALYSIS  100 mcg Subcutaneous Q Wed-1800  . gabapentin  300 mg Oral BID  . iron polysaccharides  150 mg Oral BID  . levothyroxine  50 mcg Oral Daily  . linagliptin  5 mg Oral Daily  . metoprolol succinate  12.5 mg Oral Daily  . omeprazole  20 mg Oral Daily  . sodium chloride flush  10-40 mL Intracatheter Q12H      Madelon Lips, MD 05/23/2019, 12:47 PM

## 2019-05-23 NOTE — Progress Notes (Addendum)
Progress Note  Patient Name: Nicole Sellers Date of Encounter: 05/23/2019  Primary Cardiologist: Quay Burow, MD   Subjective   Primary complaint this morning is difficulty swallowing pills. She has had this issue in the past and required esophageal stretching and antifungal for esophagitis. She is frustrated by the fluid restriction. We discussed her CHF diagnosis again today. She continues to deny any trouble with chest pain in the weeks leading up to admission. She feels her breathing and swelling have improved some.  Inpatient Medications    Scheduled Meds: . apixaban  5 mg Oral BID  . aspirin  81 mg Oral Daily  . atorvastatin  10 mg Oral QHS  . Chlorhexidine Gluconate Cloth  6 each Topical Daily  . darbepoetin (ARANESP) injection - NON-DIALYSIS  100 mcg Subcutaneous Q Wed-1800  . gabapentin  300 mg Oral BID  . iron polysaccharides  150 mg Oral BID  . levothyroxine  50 mcg Oral Daily  . linagliptin  5 mg Oral Daily  . metoprolol succinate  12.5 mg Oral Daily  . omeprazole  20 mg Oral Daily  . sodium chloride flush  10-40 mL Intracatheter Q12H   Continuous Infusions: . ferumoxytol 510 mg (05/22/19 1602)  . furosemide 120 mg (05/22/19 2105)   PRN Meds: acetaminophen **OR** acetaminophen, albuterol, naphazoline-glycerin, ondansetron **OR** ondansetron (ZOFRAN) IV, sodium chloride flush, traMADol   Vital Signs    Vitals:   05/22/19 1614 05/22/19 1751 05/22/19 2108 05/23/19 0527  BP: 123/72  106/64 115/69  Pulse: 85  73 75  Resp:   20 16  Temp:   98 F (36.7 C) 97.7 F (36.5 C)  TempSrc:   Oral Oral  SpO2:   100% 94%  Weight:  71.3 kg  69.4 kg  Height:  5\' 6"  (1.676 m)      Intake/Output Summary (Last 24 hours) at 05/23/2019 0900 Last data filed at 05/23/2019 0535 Gross per 24 hour  Intake 1439 ml  Output 2175 ml  Net -736 ml   Filed Weights   05/22/19 1751 05/23/19 0527  Weight: 71.3 kg 69.4 kg    Telemetry    Sinus rhythm with occasional  tachycardia to the 120s.  - Personally Reviewed  ECG    No new tracings - Personally Reviewed  Physical Exam   GEN: Sitting upright in bed in no acute distress.   Neck: No JVD, no carotid bruits Cardiac: RRR, no murmurs, rubs, or gallops.  Respiratory: decreased breath sounds at lung bases with faint crackles GI: NABS, Soft, nontender, non-distended  MS: 1-2+ pitting edema to thighs, no deformity. Neuro:  Nonfocal, moving all extremities spontaneously Psych: Normal affect   Labs    Chemistry Recent Labs  Lab 05/21/19 1604 05/22/19 0500 05/23/19 0307  NA 136 138 137  K 4.1 3.8 3.4*  CL 107 108 105  CO2 17* 19* 22  GLUCOSE 153* 167* 184*  BUN 73* 67* 64*  CREATININE 3.29* 3.16* 2.81*  CALCIUM 8.8* 8.9 8.7*  PROT 6.2* 6.4* 6.1*  ALBUMIN 3.2* 3.3* 3.1*  AST 101* 85* 57*  ALT 187* 169* 136*  ALKPHOS 159* 143* 147*  BILITOT 0.6 0.5 0.6  GFRNONAA 13* 14* 16*  GFRAA 16* 16* 19*  ANIONGAP 12 11 10      Hematology Recent Labs  Lab 05/21/19 1604 05/22/19 0500 05/23/19 0307  WBC 3.2* 3.1* 2.9*  RBC 2.39* 2.57* 2.64*  HGB 7.4* 7.8* 8.1*  HCT 24.1* 25.7* 26.9*  MCV 100.8* 100.0 101.9*  MCH 31.0 30.4 30.7  MCHC 30.7 30.4 30.1  RDW 17.5* 18.1* 18.1*  PLT 307 319 324    Cardiac EnzymesNo results for input(s): TROPONINI in the last 168 hours. No results for input(s): TROPIPOC in the last 168 hours.   BNP Recent Labs  Lab 05/22/19 1633  BNP >4,500.0*     DDimer No results for input(s): DDIMER in the last 168 hours.   Radiology    CT ABDOMEN PELVIS WO CONTRAST  Result Date: 05/21/2019 CLINICAL DATA:  Pleural effusions, jaundice and weakness for 3 days. EXAM: CT CHEST, ABDOMEN AND PELVIS WITHOUT CONTRAST TECHNIQUE: Multidetector CT imaging of the chest, abdomen and pelvis was performed following the standard protocol without IV contrast. COMPARISON:  CT abdomen and pelvis 03/30/2019. FINDINGS: CT CHEST FINDINGS Cardiovascular: Cardiomegaly. Calcific aortic and  coronary atherosclerosis. No pericardial effusion. No aortic aneurysm. Mediastinum/Nodes: No enlarged mediastinal, hilar, or axillary lymph nodes. Thyroid gland, trachea, and esophagus demonstrate no significant findings. Lungs/Pleura: Small bilateral pleural effusions. There is interlobular septal thickening and scattered foci of mild ground-glass attenuation. No consolidative process, nodule or mass. Mild compressive atelectasis in the bases noted. Musculoskeletal: No acute bony abnormality. CT ABDOMEN PELVIS FINDINGS Hepatobiliary: No focal liver abnormality is seen. No gallstones, gallbladder wall thickening, or biliary dilatation. Pancreas: Unremarkable. No pancreatic ductal dilatation or surrounding inflammatory changes. Spleen: Normal in size without focal abnormality. Adrenals/Urinary Tract: The right kidney appears normal. 0.6 cm calcification upper pole left kidney is compatible with a nonobstructing stone. Ureters and urinary bladder appear normal. Stomach/Bowel: The patient is status post low anterior resection. 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 There is some sigmoid diverticulosis. The appendix appears normal. No evidence of bowel obstruction. Stomach is unremarkable Vascular/Lymphatic: Aortic atherosclerosis. No enlarged abdominal or pelvic lymph nodes. Reproductive: Status post hysterectomy. No adnexal masses. Other: None. Musculoskeletal: No acute or focal abnormality. IMPRESSION: Interlobular septal thickening in the chest consistent with pulmonary edema. Associated small bilateral pleural effusions and cardiomegaly noted. No acute abnormality abdomen or pelvis. Status post low anterior resection with an ileostomy in place. Mild diverticulosis without diverticulitis noted. Aortic Atherosclerosis (ICD10-I70.0). Calcific coronary artery disease is also seen. Electronically Signed   By: Inge Rise M.D.   On: 05/21/2019 18:59   DG Chest 2 View  Result Date: 05/21/2019 CLINICAL DATA:  Weakness x3 days.  EXAM: CHEST - 2 VIEW COMPARISON:  March 30, 2019 FINDINGS: There is stable right-sided venous Port-A-Cath positioning. Mild to moderate severity diffusely increased lung markings are seen which is increased in severity when compared to the prior study. Mild areas of atelectasis and/or infiltrate are seen within the bilateral lung bases. There are small bilateral pleural effusions. No pneumothorax is identified. The cardiac silhouette is markedly enlarged. This is increased in size when compared to the prior study. The visualized skeletal structures are unremarkable. IMPRESSION: 1. Findings consistent with mild to moderate congestive heart failure. 2. Mild bibasilar atelectasis and/or infiltrate. 3. Small bilateral pleural effusions. 4. Interval enlargement of the cardiac silhouette since the prior chest plain film dated March 30, 2019. The presence of a pericardial effusion cannot be excluded. Electronically Signed   By: Virgina Norfolk M.D.   On: 05/21/2019 18:16   CT Chest Wo Contrast  Result Date: 05/21/2019 CLINICAL DATA:  Pleural effusions, jaundice and weakness for 3 days. EXAM: CT CHEST, ABDOMEN AND PELVIS WITHOUT CONTRAST TECHNIQUE: Multidetector CT imaging of the chest, abdomen and pelvis was performed following the standard protocol without IV contrast. COMPARISON:  CT abdomen and pelvis  03/30/2019. FINDINGS: CT CHEST FINDINGS Cardiovascular: Cardiomegaly. Calcific aortic and coronary atherosclerosis. No pericardial effusion. No aortic aneurysm. Mediastinum/Nodes: No enlarged mediastinal, hilar, or axillary lymph nodes. Thyroid gland, trachea, and esophagus demonstrate no significant findings. Lungs/Pleura: Small bilateral pleural effusions. There is interlobular septal thickening and scattered foci of mild ground-glass attenuation. No consolidative process, nodule or mass. Mild compressive atelectasis in the bases noted. Musculoskeletal: No acute bony abnormality. CT ABDOMEN PELVIS FINDINGS  Hepatobiliary: No focal liver abnormality is seen. No gallstones, gallbladder wall thickening, or biliary dilatation. Pancreas: Unremarkable. No pancreatic ductal dilatation or surrounding inflammatory changes. Spleen: Normal in size without focal abnormality. Adrenals/Urinary Tract: The right kidney appears normal. 0.6 cm calcification upper pole left kidney is compatible with a nonobstructing stone. Ureters and urinary bladder appear normal. Stomach/Bowel: The patient is status post low anterior resection. 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 There is some sigmoid diverticulosis. The appendix appears normal. No evidence of bowel obstruction. Stomach is unremarkable Vascular/Lymphatic: Aortic atherosclerosis. No enlarged abdominal or pelvic lymph nodes. Reproductive: Status post hysterectomy. No adnexal masses. Other: None. Musculoskeletal: No acute or focal abnormality. IMPRESSION: Interlobular septal thickening in the chest consistent with pulmonary edema. Associated small bilateral pleural effusions and cardiomegaly noted. No acute abnormality abdomen or pelvis. Status post low anterior resection with an ileostomy in place. Mild diverticulosis without diverticulitis noted. Aortic Atherosclerosis (ICD10-I70.0). Calcific coronary artery disease is also seen. Electronically Signed   By: Inge Rise M.D.   On: 05/21/2019 18:59   ECHOCARDIOGRAM COMPLETE  Result Date: 05/22/2019    ECHOCARDIOGRAM REPORT   Patient Name:   DONTAVIA BRAND Date of Exam: 05/22/2019 Medical Rec #:  [id number]        Height:       66.5 in Accession #:    [id number]       Weight:       154.5 lb Date of Birth:  March 26, 1950        BSA:          1.802 m Patient Age:    76 years         BP:           123/73 mmHg Patient Gender: F                HR:           84 bpm. Exam Location:  Inpatient Procedure: 2D Echo, 3D Echo, Color Doppler, Cardiac Doppler and Strain Analysis Indications:    W29.56 Acute diastolic (congestive) heart failure  History:         Patient has prior history of Echocardiogram examinations, most                 recent 03/31/2019. Risk Factors:Hypertension, Diabetes and                 Dyslipidemia.  Sonographer:    Raquel Sarna Senior RDCS Referring Phys: [phone] JARED M GARDNER  Sonographer Comments: Patient scanned sitting up due to dyspnea. IMPRESSIONS  1. RVEF is moderately decreased.  2. Since the last study on 03/31/2019 there has been a significant change, LVEF has decreased from 50-55% (with akinesis in the basal and mid anteroseptal and anterior walls) to 15-20% with severe diffuse hypokinesis and paradoxical septal motion.  3. There is no thrombus on the left ventricle (on Definity echo contrast imaging).  4. Left ventricular ejection fraction, by estimation, is 20 to 25%. The left ventricle has severely decreased function. The left ventricle demonstrates global hypokinesis. The left  ventricular internal cavity size was moderately dilated. Left ventricular diastolic parameters are consistent with Grade II diastolic dysfunction (pseudonormalization). Elevated left atrial pressure. The average left ventricular global longitudinal strain is -7.0 %.  5. Right ventricular systolic function is moderately reduced. The right ventricular size is moderately enlarged. There is mildly elevated pulmonary artery systolic pressure. The estimated right ventricular systolic pressure is 66.4 mmHg.  6. Left atrial size was moderately dilated.  7. Right atrial size was mildly dilated.  8. The mitral valve is normal in structure. Moderate mitral valve regurgitation. No evidence of mitral stenosis.  9. Tricuspid valve regurgitation is moderate. 10. The aortic valve is normal in structure. Aortic valve regurgitation is mild. Mild to moderate aortic valve sclerosis/calcification is present, without any evidence of aortic stenosis. 11. The inferior vena cava is normal in size with greater than 50% respiratory variability, suggesting right atrial pressure of 3 mmHg.  FINDINGS  Left Ventricle: Left ventricular ejection fraction, by estimation, is 20 to 25%. The left ventricle has severely decreased function. The left ventricle demonstrates global hypokinesis. The average left ventricular global longitudinal strain is -7.0 %. The left ventricular internal cavity size was moderately dilated. There is no left ventricular hypertrophy. Abnormal (paradoxical) septal motion, consistent with left bundle branch block. Left ventricular diastolic parameters are consistent with Grade II  diastolic dysfunction (pseudonormalization). Elevated left atrial pressure. Right Ventricle: The right ventricular size is moderately enlarged. No increase in right ventricular wall thickness. Right ventricular systolic function is moderately reduced. There is mildly elevated pulmonary artery systolic pressure. The tricuspid regurgitant velocity is 2.61 m/s, and with an assumed right atrial pressure of 15 mmHg, the estimated right ventricular systolic pressure is 40.3 mmHg. Left Atrium: Left atrial size was moderately dilated. Right Atrium: Right atrial size was mildly dilated. Pericardium: A small pericardial effusion is present. The pericardial effusion is posterior and lateral to the left ventricle. There is no evidence of cardiac tamponade. Mitral Valve: The mitral valve is normal in structure. Normal mobility of the mitral valve leaflets. Moderate mitral valve regurgitation, with centrally-directed jet. No evidence of mitral valve stenosis. Tricuspid Valve: The tricuspid valve is normal in structure. Tricuspid valve regurgitation is moderate . No evidence of tricuspid stenosis. Aortic Valve: The aortic valve is normal in structure.. There is moderate thickening and mild calcification of the aortic valve. Aortic valve regurgitation is mild. Mild to moderate aortic valve sclerosis/calcification is present, without any evidence of  aortic stenosis. There is moderate thickening of the aortic valve. There is  mild calcification of the aortic valve. Pulmonic Valve: The pulmonic valve was normal in structure. Pulmonic valve regurgitation is not visualized. No evidence of pulmonic stenosis. Aorta: The aortic root is normal in size and structure. Venous: The inferior vena cava is normal in size with greater than 50% respiratory variability, suggesting right atrial pressure of 3 mmHg. IAS/Shunts: No atrial level shunt detected by color flow Doppler.  LEFT VENTRICLE PLAX 2D LVIDd:         4.60 cm  Diastology LVIDs:         4.20 cm  LV e' lateral:   7.29 cm/s LV PW:         1.00 cm  LV E/e' lateral: 11.6 LV IVS:        0.80 cm  LV e' medial:    3.70 cm/s LVOT diam:     1.90 cm  LV E/e' medial:  22.8 LV SV:  39 LV SV Index:   22       2D Longitudinal Strain LVOT Area:     2.84 cm 2D Strain GLS Avg:     -7.0 %  RIGHT VENTRICLE RV S prime:     9.57 cm/s TAPSE (M-mode): 1.8 cm LEFT ATRIUM             Index       RIGHT ATRIUM           Index LA diam:        4.30 cm 2.39 cm/m  RA Area:     18.45 cm LA Vol (A2C):   93.4 ml 51.82 ml/m RA Volume:   50.20 ml  27.85 ml/m LA Vol (A4C):   84.1 ml 46.63 ml/m LA Biplane Vol: 85.7 ml 47.55 ml/m  AORTIC VALVE LVOT Vmax:   77.00 cm/s LVOT Vmean:  45.900 cm/s LVOT VTI:    0.139 m  AORTA Ao Root diam: 2.90 cm Ao Asc diam:  2.60 cm MITRAL VALVE               TRICUSPID VALVE MV Area (PHT): 5.23 cm    TR Peak grad:   27.2 mmHg MV Decel Time: 145 msec    TR Vmax:        261.00 cm/s MV E velocity: 84.40 cm/s MV A velocity: 89.10 cm/s  SHUNTS MV E/A ratio:  0.95        Systemic VTI:  0.14 m                            Systemic Diam: 1.90 cm Ena Dawley MD Electronically signed by Ena Dawley MD Signature Date/Time: 05/22/2019/1:09:35 PM    Final     Cardiac Studies   Lexiscan Myoview 06/14/2018:  Nuclear stress EF: 56%.  The left ventricular ejection fraction is normal (55-65%).  There was no ST segment deviation noted during stress.  No T wave inversion was noted during  stress.  Defect 1: There is a small defect of mild severity present in the basal anterior location.  The study is normal.  This is a low risk study.  Low risk stress nuclear study with normal perfusion and normal left ventricular regional and global systolic function. _______________  Echocardiogram 05/22/2019: Impressions: 1. RVEF is moderately decreased.  2. Since the last study on 03/31/2019 there has been a significant change,  LVEF has decreased from 50-55% (with akinesis in the basal and mid  anteroseptal and anterior walls) to 15-20% with severe diffuse hypokinesis  and paradoxical septal motion.  3. There is no thrombus on the left ventricle (on Definity echo contrast  imaging).  4. Left ventricular ejection fraction, by estimation, is 20 to 25%. The  left ventricle has severely decreased function. The left ventricle  demonstrates global hypokinesis. The left ventricular internal cavity size  was moderately dilated. Left  ventricular diastolic parameters are consistent with Grade II diastolic  dysfunction (pseudonormalization). Elevated left atrial pressure. The  average left ventricular global longitudinal strain is -7.0 %.  5. Right ventricular systolic function is moderately reduced. The right  ventricular size is moderately enlarged. There is mildly elevated  pulmonary artery systolic pressure. The estimated right ventricular  systolic pressure is 12.4 mmHg.  6. Left atrial size was moderately dilated.  7. Right atrial size was mildly dilated.  8. The mitral valve is normal in structure. Moderate mitral valve  regurgitation. No evidence of mitral  stenosis.  9. Tricuspid valve regurgitation is moderate.  10. The aortic valve is normal in structure. Aortic valve regurgitation is  mild. Mild to moderate aortic valve sclerosis/calcification is present,  without any evidence of aortic stenosis.  11. The inferior vena cava is normal in size with greater than 50%   respiratory variability, suggesting right atrial pressure of 3 mmHg.   Patient Profile     69 y.o. 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   Assessment & Plan    1. Acute combined CHF: patient presented with DOE, orthopnea, and edema. CXR/CT chest c/w CHF. BNP >4500. Echo revealed EF 15-20%, down from 50-55% 03/2019, with G2DD, moderate MR/TR, and no LV thrombus. EKG this admission revealed LBBB which may have been present since September. The etiology of her cardiomyopathy is felt to be ischemic in nature given her PAD history. Unfortunately CKD stage 4-5 limits evaluation at this time, as patient is not interested in the possibility of dialysis at this time. CKD further limits management with inability to add ACEi/ARB/ARNI. She was started on IV lasix 120mg  TID as directed by nephrology. UOP is net -7101mL in the past 24 hours and weight is down to 153lbs from 157lbs yesterday. Low dose metoprolol succinate initiated yesterday. Cr downtrending from 3.29 on admission to 2.81 today.  - Continue diuresis per nephrology - Continue metoprolol succinate  - If patient is agreeable to the possibility of dialysis, would pursue a R/LHC to evaluate for ischemic etiology.  - Continue to monitor strict I&Os, daily weights, renal function, and electrolytes closely - Continue aspirin and statin for presumed CAD  2. PAD: s/p PTA/stentint to the right SFA in 2014 due to ischemic toe and ultimately required  2nd toe amputation. She then underwent PTA/stenting to right external iliac artery in 04/2017. Most recent lower extremity dopplers in 03/2019 showed patent right SFA stent and 30-40% stenosis of left common femoral artery as well as occlusion of the right peroneal artery, left SFA with reconstition in the distal segment, and left peroneal artery. Bilateral ABIs and TBIs decreased from 2019 studies. - Continue aspirin and statin - Continue outpatient follow-up with Dr. Gwenlyn Found  3. Carotid artery stenosis: mild right ICA and moderate left ICA on doppler 06/2018. Due for follow-up dopplers 06/2019 - Continue aspirin and statin - Continue routine outpatient monitoring  4. HTN: BP briefly low yesterday morning, otherwise stable with addition of metoprolol succinate 12.5mg  daily yesterday. - Continue metoprolol succinate  5. HLD: no recent lipids on file.  - Will add on lipid panel to AM labs for risk stratification.  - Continue atorvastatin for now  6. DVT: diagnosed 11/2018 but anticoagulation not initiated due to loss of follow-up. Subsequently started on eliquis 03/2019 - Continue eliquis  7. DM type 2: A1C 5.4 03/2019; at goal of <7 - Continue management per primary team.  8. CKD stage 4-5: Cr improved from 3.29 on admission to 2.81 today with IV diuresis. Nephrology following. Patient is not interested in  dialysis unless it was a life or death issue.  - Continue to monitor closely  9. Anemia of chronic disease/iron deficiency: Hgb trough 6.6. Nephrology started on feraheme and aranesp. Hgb stable at 7.8 today - Continue management per primary team and nephrology  10. Transaminitis: likely 2/2 hepatic congestion. LFTs trending down.  - Continue to monitor.   For questions or updates, please contact [place] Please consult [email or web address] for contact info under Cardiology/STEMI.      Signed, Abigail Butts, PA-C  05/23/2019, 9:00 AM   [phone]  Patient seen and examined with  Roby Lofts, PA-C.  Agree as above, with the following exceptions and changes as noted below.  She remains drowsy today and does not easily engage in conversation. Gen: NAD, CV: RRR, no murmurs, Lungs: clear, Abd: soft, Extrem: Warm, well perfused, 1+ lower extremity edema, 2+ thigh edema, neuro/Psych: Oriented but drowsy, normal mood and affect. All available labs, radiology testing, previous records reviewed.  I discussed her care with Dr. Cruzita Lederer.  Given acute decompensated heart failure as presentation with ejection fraction 15%, would continue to recommend an ischemic evaluation.  With her renal dysfunction, we will need to be mindful of contrast administration.  Fortunately with Lasix challenge administered by nephrology her creatinine is improving.  Dr. Cruzita Lederer and I have discussed timing of coronary angiography.  This would need to be when the GFR is significantly better or dialysis has been established.  I would however recommend coronary angiography be performed while in hospital so that renal function can be closely followed afterward.  Beta-blocker initiated yesterday with some nocturnal bradycardia, will maintain current dose.  Cannot currently use Entresto, ACE inhibitor, ARB.  Elouise Munroe, MD 05/23/19 1:32 PM

## 2019-05-23 NOTE — Progress Notes (Signed)
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  She has been followed by nephrology as an outpatient and she and her nephrologist discussed about dialysis however she has been refusing dialysis.  It appears that currently she is willing to do that in the ED she was found to be fluid overloaded with pulmonary edema, small bilateral pleural effusion and lower extremity swelling.  She was placed on IV Lasix.  Subjective / 24h Interval events: Continues to complain of shortness of breath but appreciates slightly better with improved lower extremity swelling.  Not very engaging in conversation today  Assessment & Plan: Principal Problem Acute hypoxic respiratory failure in the setting of acute pulmonary edema multifactorial due to acute systolic CHF and progression of chronic kidney disease -appreciate nephrology assistance, she has been placed on high-dose IV Lasix 120 every 8.  Clinically her volume status appears to improve, 1.7 L urine output over the last 24 hours.  Creatinine actually got a little bit better.  Active Problems Acute kidney injury on chronic kidney disease stage IV-nephrology consulted, currently on Lasix challenge.  Overall stable, does not need acute dialysis  Acute systolic CHF-appreciate cardiology follow-up, 2D echo done yesterday showed a depressed EF which is new.  She will eventually need ischemic work-up, preferably during this hospital stay, but will need to address goals of care with patient as  well as her wishes regarding dialysis  Anemia of chronic renal disease-continue p.o. iron, hemoglobin overall stable  Dysphagia-reported this morning, apparently it has been a problem in the past as well.  Obtain speech evaluation  Elevated LFTs-possible congestion from fluid overload, monitor CMP  History of DVT-continue Eliquis  Rectal cancer- in remission  DM2-continue Januvia  Goals of care-I tried to engage patient today in assessing her wishes/goals for her medical care.  She does not appear very willing to actively participate in this discussion for the time being.  Apparently she will have her son visiting later today and will try discussing with him also.  Palliative care has been consulted as well.  Scheduled Meds: . apixaban  5 mg Oral BID  . aspirin  81 mg Oral Daily  . atorvastatin  10 mg Oral QHS  . Chlorhexidine Gluconate Cloth  6 each Topical Daily  . darbepoetin (ARANESP) injection - NON-DIALYSIS  100 mcg Subcutaneous Q Wed-1800  . gabapentin  300 mg Oral BID  . iron polysaccharides  150 mg Oral BID  . levothyroxine  50 mcg Oral Daily  . linagliptin  5 mg Oral Daily  . metoprolol succinate  12.5 mg Oral Daily  . omeprazole  20 mg Oral Daily  . sodium chloride flush  10-40 mL Intracatheter Q12H   Continuous Infusions: . ferumoxytol 510 mg (05/22/19 1602)  . furosemide 120 mg (05/23/19 1037)   PRN Meds:.acetaminophen **OR** acetaminophen, albuterol, naphazoline-glycerin, ondansetron **OR** ondansetron (ZOFRAN) IV, sodium chloride flush, traMADol  DVT prophylaxis: Eliquis  Code Status: DNR Family Communication: no family at bedside   Status is: Inpatient  Remains inpatient appropriate because:Inpatient level of care appropriate due to severity  of illness  Dispo: The patient is from: Home              Anticipated d/c is to: Home              Anticipated d/c date is: 3 days              Patient currently is not medically stable to d/c.  Consultants:   Nephrology   Procedures:  None   Microbiology  None   Antimicrobials: None     Objective: Vitals:   05/22/19 2108 05/23/19 0527 05/23/19 1033 05/23/19 1322  BP: 106/64 115/69 118/62 90/73  Pulse: 73 75 78 80  Resp: 20 16    Temp: 98 F (36.7 C) 97.7 F (36.5 C)  97.9 F (36.6 C)  TempSrc: Oral Oral    SpO2: 100% 94%  98%  Weight:  69.4 kg    Height:        Intake/Output Summary (Last 24 hours) at 05/23/2019 1332 Last data filed at 05/23/2019 0930 Gross per 24 hour  Intake 679 ml  Output 2100 ml  Net -1421 ml   Filed Weights   05/22/19 1751 05/23/19 0527  Weight: 71.3 kg 69.4 kg    Examination:  Constitutional: nad Eyes: no icterus  ENMT: mmm Neck: normal, supple Respiratory: faint crackles, no wheezing.  Cardiovascular: RRR, no murmurs, 1+ edema Abdomen: soft, nt, nd bs+ Musculoskeletal: no clubbing / cyanosis.  Skin: no rashes seen Neurologic: no focal deficits  Data Reviewed: I have independently reviewed following labs and imaging studies   CBC: Recent Labs  Lab 05/21/19 1604 05/22/19 0500 05/23/19 0307  WBC 3.2* 3.1* 2.9*  NEUTROABS 2.4  --   --   HGB 7.4* 7.8* 8.1*  HCT 24.1* 25.7* 26.9*  MCV 100.8* 100.0 101.9*  PLT 307 319 097   Basic Metabolic Panel: Recent Labs  Lab 05/21/19 1604 05/21/19 2042 05/22/19 0500 05/23/19 0307  NA 136  --  138 137  K 4.1  --  3.8 3.4*  CL 107  --  108 105  CO2 17*  --  19* 22  GLUCOSE 153*  --  167* 184*  BUN 73*  --  67* 64*  CREATININE 3.29*  --  3.16* 2.81*  CALCIUM 8.8*  --  8.9 8.7*  MG  --  1.9  --   --    Liver Function Tests: Recent Labs  Lab 05/21/19 1604 05/22/19 0500 05/23/19 0307  AST 101* 85* 57*  ALT 187* 169* 136*  ALKPHOS 159* 143* 147*  BILITOT 0.6 0.5 0.6  PROT 6.2* 6.4* 6.1*  ALBUMIN 3.2* 3.3* 3.1*   Coagulation Profile: No results for input(s): INR, PROTIME in the last 168 hours. HbA1C: No results for input(s): HGBA1C in the last 72 hours. CBG: Recent Labs   Lab 05/21/19 1532  GLUCAP 137*    Recent Results (from the past 240 hour(s))  SARS Coronavirus 2 by RT PCR (hospital order, performed in [facility] hospital lab) Nasopharyngeal Nasopharyngeal Swab     Status: None   Collection Time: 05/21/19  5:33 PM   Specimen: Nasopharyngeal Swab  Result Value Ref Range Status   SARS Coronavirus 2 NEGATIVE NEGATIVE Final    Comment: (NOTE) SARS-CoV-2 target nucleic acids are NOT DETECTED. The SARS-CoV-2 RNA is generally detectable in upper and lower respiratory specimens during the acute phase of infection. The lowest concentration of SARS-CoV-2 viral copies this assay can detect is 250 copies / mL. A negative result  does not preclude SARS-CoV-2 infection and should not be used as the sole basis for treatment or other patient management decisions.  A negative result may occur with improper specimen collection / handling, submission of specimen other than nasopharyngeal swab, presence of viral mutation(s) within the areas targeted by this assay, and inadequate number of viral copies (<250 copies / mL). A negative result must be combined with clinical observations, patient history, and epidemiological information. Fact Sheet for Patients:   StrictlyIdeas.no Fact Sheet for Healthcare Providers: BankingDealers.co.za This test is not yet approved or cleared  by the [place] FDA and has been authorized for detection and/or diagnosis of SARS-CoV-2 by FDA under an Emergency Use Authorization (EUA).  This EUA will remain in effect (meaning this test can be used) for the duration of the COVID-19 declaration under Section 564(b)(1) of the Act, 21 U.S.C. section 360bbb-3(b)(1), unless the authorization is terminated or revoked sooner. Performed at [facility], [place] [address] [place], [place] [address]      Radiology Studies: No results found. Marzetta Board, MD, PhD [facility]  Between 7 am - 7 pm I am available, please contact me via Amion or Securechat  Between 7 pm - 7 am I am not available, please contact night coverage MD/APP via Amion

## 2019-05-24 ENCOUNTER — Encounter (HOSPITAL_COMMUNITY): Payer: Self-pay

## 2019-05-24 ENCOUNTER — Inpatient Hospital Stay (HOSPITAL_COMMUNITY): Admission: RE | Admit: 2019-05-24 | Payer: Medicare Other | Source: Ambulatory Visit

## 2019-05-24 DIAGNOSIS — Z515 Encounter for palliative care: Secondary | ICD-10-CM

## 2019-05-24 DIAGNOSIS — R531 Weakness: Secondary | ICD-10-CM

## 2019-05-24 DIAGNOSIS — Z7189 Other specified counseling: Secondary | ICD-10-CM

## 2019-05-24 LAB — BASIC METABOLIC PANEL
Anion gap: 8 (ref 5–15)
BUN: 53 mg/dL — ABNORMAL HIGH (ref 8–23)
CO2: 24 mmol/L (ref 22–32)
Calcium: 8.3 mg/dL — ABNORMAL LOW (ref 8.9–10.3)
Chloride: 104 mmol/L (ref 98–111)
Creatinine, Ser: 2.52 mg/dL — ABNORMAL HIGH (ref 0.44–1.00)
GFR calc Af Amer: 21 mL/min — ABNORMAL LOW (ref >60)
GFR calc non Af Amer: 19 mL/min — ABNORMAL LOW (ref >60)
Glucose, Bld: 182 mg/dL — ABNORMAL HIGH (ref 70–99)
Potassium: 3 mmol/L — ABNORMAL LOW (ref 3.5–5.1)
Sodium: 136 mmol/L (ref 135–145)

## 2019-05-24 LAB — CBC
HCT: 25.7 % — ABNORMAL LOW (ref 36.0–46.0)
Hemoglobin: 7.8 g/dL — ABNORMAL LOW (ref 12.0–15.0)
MCH: 31 pg (ref 26.0–34.0)
MCHC: 30.4 g/dL (ref 30.0–36.0)
MCV: 102 fL — ABNORMAL HIGH (ref 80.0–100.0)
Platelets: 271 10*3/uL (ref 150–400)
RBC: 2.52 MIL/uL — ABNORMAL LOW (ref 3.87–5.11)
RDW: 18.6 % — ABNORMAL HIGH (ref 11.5–15.5)
WBC: 5 10*3/uL (ref 4.0–10.5)
nRBC: 1 % — ABNORMAL HIGH (ref 0.0–0.2)

## 2019-05-24 MED ORDER — POTASSIUM CHLORIDE CRYS ER 20 MEQ PO TBCR
40.0000 meq | EXTENDED_RELEASE_TABLET | Freq: Once | ORAL | Status: AC
  Administered 2019-05-24: 40 meq via ORAL
  Filled 2019-05-24: qty 2

## 2019-05-24 NOTE — Progress Notes (Signed)
[facility] Progress Note    Assessment/ Plan:   51F CKD4 presenting with hypervolemia, anemia, SOB/Weakness.  I think her presentation is multifactorial including a component of acute on chronic diastolic heart failure, persistent anemia, GFR probably lower than creatinine suggest based upon muscle mass with potential some uremia.  1. CKD4, likely that GFR lower than 16 2. Acute on chronic diastolic heart failure exacerbation with hypervolemia and chronic lower extremity edema 3. Chronic anemia with some features of iron deficiency, likely driven by #1, plan was to start ESA and IV iron as outpatient but postponed to this week 4. Chronic hyperkalemia on Lokelma 5. Chronic metabolic acidosis likely a consequence of #1 presence of ileostomy 6. Chronic ileostomy 7. Colorectal cancer status post radiation and colectomy, resolved 8. Hypertension, blood pressure stable 9. DM 2 10. History of PAD   Plan 1. Increased lasix to 120 IV TID--> good diuresis, continue for today Cr is improving to 2.8--> 2.5 today.  Anticipate transitioning to oral Lasix in 1-2 days 2. Hold Lokelma, for now 3. S/p Aranesp 100 mcg 5/13 4. Fereheme 510mg  IV 5/13 5. No HD needed currently, she would possibly pursue if indicated 6. Would consult palliative to clarify GOC--> per notes, pt not willing to engage, will table that discussion 7. Daily weights, Daily Renal Panel, Strict I/Os, Avoid nephrotoxins (NSAIDs, judicious IV Contrast)   Subjective:    No complaints.  Off O2.  Diuresing well.     Objective:   BP 134/74 (BP Location: Right Arm)   Pulse 84   Temp 98.7 F (37.1 C) (Oral)   Resp 18   Ht 5\' 6"  (1.676 m)   Wt 68.6 kg   SpO2 93%   BMI 24.40 kg/m   Intake/Output Summary (Last 24 hours) at 05/24/2019 1350 Last data filed at 05/24/2019 1118 Gross per 24 hour  Intake 698 ml  Output 1725 ml  Net -1027 ml   Weight change: -2.722 kg  Physical Exam: Gen: NAD CVS: RRR Resp:  wearing O2, some muffled basilar sounds Abd: soft Ext: 1+ edema, some skin wrinkling, improving  Imaging: No results found.  Labs: BMET Recent Labs  Lab 05/21/19 1604 05/22/19 0500 05/23/19 0307 05/24/19 0627  NA 136 138 137 136  K 4.1 3.8 3.4* 3.0*  CL 107 108 105 104  CO2 17* 19* 22 24  GLUCOSE 153* 167* 184* 182*  BUN 73* 67* 64* 53*  CREATININE 3.29* 3.16* 2.81* 2.52*  CALCIUM 8.8* 8.9 8.7* 8.3*   CBC Recent Labs  Lab 05/21/19 1604 05/22/19 0500 05/23/19 0307 05/24/19 0627  WBC 3.2* 3.1* 2.9* 5.0  NEUTROABS 2.4  --   --   --   HGB 7.4* 7.8* 8.1* 7.8*  HCT 24.1* 25.7* 26.9* 25.7*  MCV 100.8* 100.0 101.9* 102.0*  PLT 307 319 324 271    Medications:    . apixaban  5 mg Oral BID  . aspirin  81 mg Oral Daily  . atorvastatin  10 mg Oral QHS  . Chlorhexidine Gluconate Cloth  6 each Topical Daily  . darbepoetin (ARANESP) injection - NON-DIALYSIS  100 mcg Subcutaneous Q Wed-1800  . gabapentin  300 mg Oral BID  . iron polysaccharides  150 mg Oral BID  . levothyroxine  50 mcg Oral Daily  . linagliptin  5 mg Oral Daily  . metoprolol succinate  12.5 mg Oral Daily  . omeprazole  20 mg Oral Daily  . sodium chloride flush  10-40 mL Intracatheter Q12H  Madelon Lips, MD 05/24/2019, 1:50 PM

## 2019-05-24 NOTE — Consult Note (Signed)
Consultation Note Date: 05/24/2019   Patient Name: Nicole Sellers  DOB: 1950/09/29  MRN: [id number]  Age / Sex: 69 y.o., female  PCP: Nicole Bowen, MD Referring Physician: Caren Griffins, MD  Reason for Consultation: Establishing goals of care  HPI/Patient Profile: 69 y.o. female  with past medical history of   PAD s/p stenting, carotid artery disease, DVT, DM, HTN, CKD IV-v, history of rectal cancer admitted on 05/21/2019 with acute combined CHF .   Clinical Assessment and Goals of Care:  Patient remains admitted to hospital medicine service for acute hypoxic resp failure, acute pulmonary edema, acute systolic CHF and for progression of her CKD. Cardiology and renal medicine colleagues are also following.   Nicole Sellers is resting in bed. She is awake alert, avoids eye contact for the most part, doesn't engage much. I introduced myself and palliative care as follows: Palliative medicine is specialized medical care for people living with serious illness. It focuses on providing relief from the symptoms and stress of a serious illness. The goal is to improve quality of life for both the patient and the family.  Goals of care: Broad aims of medical therapy in relation to the patient's values and preferences. Our aim is to provide medical care aimed at enabling patients to achieve the goals that matter most to them, given the circumstances of their particular medical situation and their constraints.   We reviewed about her current hospitalization, her acute and underlying conditions. We discussed about her goals, wishes and values. She doesn't have any close family member/friend that has been on dialysis. She simply states that dialysis is a long term commitment. I discussed with her about pros cons in some length. She denies difficulty swallowing, she simply states that she doesn't like to take too many pills.  She is thankful for the care she is receiving and for the information she has been provided. She will continue to think about the decisions she has to take, for her short and long term goals. See below.   NEXT OF KIN  patient, has children. Doesn't disclose how many, or where she lives, or who her HCPOA would be.   SUMMARY OF RECOMMENDATIONS    Agree with DNR Patient states she is considering possibility of needing dialysis, how ever,hasn't decided completely, declines goals of care discussions with her/her family at this time.  Continue current mode of care.  Thank you for the consult.   Code Status/Advance Care Planning:  DNR    Symptom Management:      Palliative Prophylaxis:   Delirium Protocol   Psycho-social/Spiritual:   Desire for further Chaplaincy support:yes  Additional Recommendations: Caregiving  Support/Resources  Prognosis:   Unable to determine  Discharge Planning: To Be Determined      Primary Diagnoses: Present on Admission: . CKD (chronic kidney disease) stage 4, GFR 15-29 ml/min ([place]) . DVT (deep venous thrombosis) ([place]) . Rectal cancer ([place]) . Fluid overload . Anemia of chronic disease . Transaminitis   I have reviewed the medical  record, interviewed the patient and family, and examined the patient. The following aspects are pertinent.  Past Medical History:  Diagnosis Date  . Age-related nuclear cataract, right eye   . Allergy   . Anemia   . Arthritis    "joints sometimes" (12/25/2012)  . Clotting disorder ([place])   . Colon cancer ([place])   . Critical lower limb ischemia   . Diverticulosis   . Gangrene of toe, Rt second toe 12/25/2012  . GERD (gastroesophageal reflux disease)   . High cholesterol   . Hypertension   . Hypothyroidism   . PAD (peripheral artery disease) ([place])   . PAD (peripheral artery disease) ([place])   . Peripheral neuropathy   . Rectal cancer ([place])   . S/P arterial stent, 12/25/13, successful diamondback orbital  rotational arthrectomy, PTA using chocolate  balloon and stenting using I DEV stent of long segment calcified high-grade proximal and mid r 12/25/2012  . Tobacco abuse   . Type II diabetes mellitus ([place])    Social History   Socioeconomic History  . Marital status: Single    Spouse name: Not on file  . Number of children: 1  . Years of education: Not on file  . Highest education level: Not on file  Occupational History  . Occupation: CNA    Comment: Retired   Tobacco Use  . Smoking status: Former Smoker    Packs/day: 0.50    Years: 48.00    Pack years: 24.00    Types: Cigarettes  . Smokeless tobacco: Never Used  . Tobacco comment: quit in 2016  Substance and Sexual Activity  . Alcohol use: No    Alcohol/week: 0.0 standard drinks  . Drug use: No  . Sexual activity: Not Currently  Other Topics Concern  . Not on file  Social History Narrative  . Not on file   Social Determinants of Health   Financial Resource Strain:   . Difficulty of Paying Living Expenses:   Food Insecurity:   . Worried About Charity fundraiser in the Last Year:   . Arboriculturist in the Last Year:   Transportation Needs:   . Film/video editor (Medical):   Marland Kitchen Lack of Transportation (Non-Medical):   Physical Activity:   . Days of Exercise per Week:   . Minutes of Exercise per Session:   Stress:   . Feeling of Stress :   Social Connections:   . Frequency of Communication with Friends and Family:   . Frequency of Social Gatherings with Friends and Family:   . Attends Religious Services:   . Active Member of Clubs or Organizations:   . Attends Archivist Meetings:   Marland Kitchen Marital Status:    Family History  Problem Relation Age of Onset  . Hypertension Mother   . Stroke Mother   . Cancer Father        prostate  . Mental illness Sister   . Diabetes Brother   . Hypertension Brother   . Cancer Brother 55       prostate  . Diabetes Brother   . Hypertension Brother   . Cancer  Brother        "tumors on neck and body"    Scheduled Meds: . apixaban  5 mg Oral BID  . aspirin  81 mg Oral Daily  . atorvastatin  10 mg Oral QHS  . Chlorhexidine Gluconate Cloth  6 each Topical Daily  . darbepoetin (ARANESP) injection - NON-DIALYSIS  100  mcg Subcutaneous Q Wed-1800  . gabapentin  300 mg Oral BID  . iron polysaccharides  150 mg Oral BID  . levothyroxine  50 mcg Oral Daily  . linagliptin  5 mg Oral Daily  . metoprolol succinate  12.5 mg Oral Daily  . omeprazole  20 mg Oral Daily  . sodium chloride flush  10-40 mL Intracatheter Q12H   Continuous Infusions: . ferumoxytol 510 mg (05/22/19 1602)  . furosemide 120 mg (05/24/19 1051)   PRN Meds:.acetaminophen **OR** acetaminophen, albuterol, naphazoline-glycerin, ondansetron **OR** ondansetron (ZOFRAN) IV, sodium chloride flush, traMADol Medications Prior to Admission:  Prior to Admission medications   Medication Sig Start Date End Date Taking? Authorizing Provider  albuterol (PROVENTIL HFA;VENTOLIN HFA) 108 (90 BASE) MCG/ACT inhaler Inhale 2 puffs into the lungs every 6 (six) hours as needed for wheezing or shortness of breath.   Yes [provider]  apixaban (ELIQUIS) 5 MG TABS tablet Take 5 mg by mouth 2 (two) times daily.   Yes [provider]  aspirin 81 MG tablet Take 81 mg by mouth daily.   Yes [provider]  atorvastatin (LIPITOR) 10 MG tablet Take 1 tablet (10 mg total) by mouth daily. Patient taking differently: Take 10 mg by mouth at bedtime.  01/01/18  Yes Minette Brine, FNP  Calcium Carbonate-Vitamin D (CALCIUM 600+D PO) Take 1 tablet by mouth daily.   Yes [provider]  cetirizine (ZYRTEC) 10 MG tablet Take 10 mg by mouth daily.   Yes [provider]  Cholecalciferol (VITAMIN D3) 50 MCG (2000 UT) TABS Take 4,000 Units by mouth daily.   Yes [provider]  FERREX 150 150 MG capsule Take 150 mg by mouth 2 (two) times daily. 08/29/17  Yes [provider]  furosemide (LASIX) 40 MG tablet Take 60 mg by mouth daily.  05/08/19  Yes [provider]  gabapentin (NEURONTIN) 300 MG capsule Take 300 mg by mouth 2 (two) times daily.    Yes [provider]  levothyroxine (SYNTHROID, LEVOTHROID) 50 MCG tablet Take 1 tablet (50 mcg total) by mouth daily. 01/01/18  Yes Minette Brine, FNP  Multiple Vitamins-Minerals (SENIOR MULTIVITAMIN PLUS PO) Take 1 tablet by mouth daily with breakfast.   Yes [provider]  omeprazole (PRILOSEC OTC) 20 MG tablet Take 20 mg by mouth daily before breakfast.   Yes [provider]  Psyllium (METAMUCIL FIBER PO) Take 1 Scoop by mouth See admin instructions. Mix 1 scoopful into 6-8 ounces of desired beverage and drink two times a day   Yes [provider]  sitaGLIPtin (JANUVIA) 25 MG tablet Take 1 tablet (25 mg total) by mouth daily. 04/04/19  Yes Donne Hazel, MD  sodium zirconium cyclosilicate (LOKELMA) 5 g packet Take 5 g by mouth 2 (two) times daily.   Yes [provider]  tetrahydrozoline-zinc (VISINE-AC) 0.05-0.25 % ophthalmic solution Place 1 drop into both eyes 2 (two) times daily as needed (dry eyes).   Yes [provider]  traMADol (ULTRAM) 50 MG tablet Take 1 tablet (50 mg total) by mouth every 12 (twelve) hours as needed for moderate pain. 04/04/19  Yes Donne Hazel, MD  Triamcinolone Acetonide (NASACORT ALLERGY 24HR NA) Place 1 spray into both nostrils daily as needed (allergies).    Yes [provider]  Trolamine Salicylate (ASPERCREME EX) Apply 1 application topically daily as needed (to affected areas, for joint pain).    Yes [provider]  vitamin B-12 (CYANOCOBALAMIN) 500 MCG tablet  Take 500 mcg by mouth daily.   Yes [provider]  apixaban (ELIQUIS) 5 MG TABS tablet Take 1 tablet (5 mg total) by mouth 2 (two) times daily. 04/04/19 05/04/19  Donne Hazel, MD   Allergies  Allergen Reactions  . Kiwi  Extract Itching and Other (See Comments)    Throat itching   . Lisinopril Other (See Comments)    Bruising   . Plavix [Clopidogrel] Other (See Comments)    "Made me feel badly- didn't agree with my system"   Review of Systems Denies pain.   Physical Exam Awake alert Flat affect, doesn't engage much.  Regular work of breathing Has some edema S1 S2 Abdomen not distended  Vital Signs: BP 134/74 (BP Location: Right Arm)   Pulse 84   Temp 98.7 F (37.1 C) (Oral)   Resp 18   Ht 5\' 6"  (1.676 m)   Wt 68.6 kg   SpO2 93%   BMI 24.40 kg/m  Pain Scale: 0-10   Pain Score: 0-No pain   SpO2: SpO2: 93 % O2 Device:SpO2: 93 % O2 Flow Rate: .O2 Flow Rate (L/min): 1 L/min  IO: Intake/output summary:   Intake/Output Summary (Last 24 hours) at 05/24/2019 1320 Last data filed at 05/24/2019 1118 Gross per 24 hour  Intake 698 ml  Output 1725 ml  Net -1027 ml    LBM: Last BM Date: 05/24/19 Baseline Weight: Weight: 71.3 kg Most recent weight: Weight: 68.6 kg     Palliative Assessment/Data:   PPS 40%  Time In:  1300 Time Out:  1400 Time Total:  60  Greater than 50%  of this time was spent counseling and coordinating care related to the above assessment and plan.  Signed by: Loistine Chance, MD   Please contact Palliative Medicine Team phone at [phone] for questions and concerns.  For individual provider: See Shea Evans

## 2019-05-24 NOTE — Plan of Care (Signed)

## 2019-05-24 NOTE — Care Management Important Message (Signed)
Important Message  Patient Details IM Letter given to Dessa Phi RN Case Manager to present to the Patient Name: Nicole Sellers MRN: [id number] Date of Birth: May 22, 1950   Medicare Important Message Given:  Yes     Kerin Salen 05/24/2019, 11:10 AM

## 2019-05-24 NOTE — Progress Notes (Signed)
Discussed with Dr. Cruzita Lederer, no new CV recommendations today. Renal function appears to be improving with lasix challenge. Based on trend, may be able to undergo ischemic eval with R/LHC early next week.   Cardiology will follow over the weekend for planning.

## 2019-05-24 NOTE — Evaluation (Signed)
Clinical/Bedside Swallow Evaluation Patient Details  Name: Nicole Sellers MRN: [id number] Date of Birth: 08-09-1950  Today's Date: 05/24/2019 Time: SLP Start Time (ACUTE ONLY): 2023 SLP Stop Time (ACUTE ONLY): 1005 SLP Time Calculation (min) (ACUTE ONLY): 13 min  Past Medical History:  Past Medical History:  Diagnosis Date  . Age-related nuclear cataract, right eye   . Allergy   . Anemia   . Arthritis    "joints sometimes" (12/25/2012)  . Clotting disorder ([place])   . Colon cancer ([place])   . Critical lower limb ischemia   . Diverticulosis   . Gangrene of toe, Rt second toe 12/25/2012  . GERD (gastroesophageal reflux disease)   . High cholesterol   . Hypertension   . Hypothyroidism   . PAD (peripheral artery disease) ([place])   . PAD (peripheral artery disease) ([place])   . Peripheral neuropathy   . Rectal cancer ([place])   . S/P arterial stent, 12/25/13, successful diamondback orbital rotational arthrectomy, PTA using chocolate  balloon and stenting using I DEV stent of long segment calcified high-grade proximal and mid r 12/25/2012  . Tobacco abuse   . Type II diabetes mellitus ([place])    Past Surgical History:  Past Surgical History:  Procedure Laterality Date  . ABDOMINAL AORTAGRAM  12/20/2012   Procedure: ABDOMINAL AORTAGRAM;  Surgeon: Lorretta Harp, MD;  Location: [facility] CATH LAB;  Service: Cardiovascular;;  . ABDOMINAL HYSTERECTOMY  2000  . ANGIOPLASTY / STENTING FEMORAL Right 12/25/2012  . ATHERECTOMY Right 12/25/2012   Procedure: ATHERECTOMY;  Surgeon: Lorretta Harp, MD;  Location: [facility] CATH LAB;  Service: Cardiovascular;  Laterality: Right;  right SFA  . COLONOSCOPY    . DIVERTING ILEOSTOMY N/A 08/03/2018   Procedure: DIVERTING LOOP ILEOSTOMY;  Surgeon: Leighton Ruff, MD;  Location: [facility] ORS;  Service: General;  Laterality: N/A;  . ESOPHAGOGASTRODUODENOSCOPY N/A 10/04/2018   Procedure: ESOPHAGOGASTRODUODENOSCOPY (EGD);  Surgeon: Carol Ada, MD;  Location: [facility] ENDOSCOPY;   Service: Endoscopy;  Laterality: N/A;  . EYE SURGERY Left    cataract  . HERNIA REPAIR    . LOWER EXTREMITY ANGIOGRAM N/A 12/20/2012   Procedure: LOWER EXTREMITY ANGIOGRAM;  Surgeon: Lorretta Harp, MD;  Location: [facility] CATH LAB;  Service: Cardiovascular;  Laterality: N/A;  . LOWER EXTREMITY ANGIOGRAM N/A 12/25/2012   Procedure: LOWER EXTREMITY ANGIOGRAM;  Surgeon: Lorretta Harp, MD;  Location: [facility] CATH LAB;  Service: Cardiovascular;  Laterality: N/A;  . LOWER EXTREMITY INTERVENTION  05/08/2017  . LOWER EXTREMITY INTERVENTION Bilateral 05/08/2017   Procedure: LOWER EXTREMITY INTERVENTION;  Surgeon: Lorretta Harp, MD;  Location: [place] CV LAB;  Service: Cardiovascular;  Laterality: Bilateral;  . PERIPHERAL VASCULAR BALLOON ANGIOPLASTY Right 05/08/2017   Procedure: PERIPHERAL VASCULAR BALLOON ANGIOPLASTY;  Surgeon: Lorretta Harp, MD;  Location: [place] CV LAB;  Service: Cardiovascular;  Laterality: Right;  sfa  . PERIPHERAL VASCULAR INTERVENTION Right 05/08/2017   Procedure: PERIPHERAL VASCULAR INTERVENTION;  Surgeon: Lorretta Harp, MD;  Location: [place] CV LAB;  Service: Cardiovascular;  Laterality: Right;  ext iliac  . PORTACATH PLACEMENT Right 11/22/2017   Procedure: INSERTION PORT-A-CATH;  Surgeon: Leighton Ruff, MD;  Location: [facility] ORS;  Service: General;  Laterality: Right;  . SAVORY DILATION N/A 10/04/2018   Procedure: SAVORY DILATION;  Surgeon: Carol Ada, MD;  Location: [facility] ENDOSCOPY;  Service: Endoscopy;  Laterality: N/A;  . TOE SURGERY Right    2n toe   . UMBILICAL HERNIA REPAIR  2000  . UMBILICAL HERNIA REPAIR N/A 08/03/2018  Procedure: UMBILICAL HERNIA REPAIR;  Surgeon: Leighton Ruff, MD;  Location: [facility];  Service: General;  Laterality: N/A;  . 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  She has been followed by nephrology as an outpatient and she and her nephrologist discussed about dialysis however she has been refusing dialysis.  Clinical swallow eval was completed 09/2018 due to odynophagia - no oropharyngeal dysphagia was identified. Pt underwent upper endoscopy 10/04/18 and stricture was found and esophagus was dilated.  Pt reports recurring symptoms of food getting "caught."   Assessment / Plan / Recommendation Clinical Impression  Pt presents with normal oropharyngeal function per clinical assessment with adequate mastication despite absence of dentures, brisk swallow response, no s/s of aspiration, good synchrony of swallow/breathing cycles.  Pt does report chronic globus.  She had an endoscopy with dilatation Sept 2020, at which time she was told it may need to be repeated.  Pt may benefit from esophageal w/u to address this; no SLP needs are identified and our service will sign off.  Continue current diet.  SLP Visit Diagnosis: Dysphagia, pharyngoesophageal phase (R13.14)    Aspiration Risk  No limitations    Diet Recommendation   regular solids, thin liquids  Medication Administration: Whole meds with liquid    Other  Recommendations Recommended Consults: Consider esophageal assessment Oral Care Recommendations: Oral care BID   Follow up Recommendations None      Frequency and Duration            Prognosis        Swallow Study   General HPI: 69 year old female with history of stage III rectal cancer  status post chemo, radiation, resection, no residual tumor and currently in remission, with history of ileostomy on 2020, chronic kidney disease stage IV, hypertension, DM 2, GERD, hypothyroidism, PAD, 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  She has been followed by nephrology as an outpatient and she and her nephrologist discussed about dialysis however she has been refusing dialysis.  Clinical swallow eval was completed 09/2018 due to odynophagia - no oropharyngeal dysphagia was identified. Pt underwent upper endoscopy 10/04/18 and stricture was found and esophagus was dilated.  Pt reports recurring symptoms of food getting "caught." Type of Study: Bedside Swallow Evaluation Previous Swallow Assessment: see HPI Temperature Spikes Noted: No Respiratory Status: Nasal cannula History of Recent Intubation: No Behavior/Cognition: Alert Oral Cavity Assessment: Within Functional Limits Oral Care Completed by SLP: No Oral Cavity - Dentition: Edentulous;Dentures, not available Vision: Functional for self-feeding Self-Feeding Abilities: Able to feed self Patient Positioning: Upright in bed Baseline Vocal Quality: Normal Volitional Cough: Strong Volitional Swallow: Able to elicit    Oral/Motor/Sensory Function Overall Oral Motor/Sensory Function: Within functional limits   Ice Chips Ice chips: Within functional limits   Thin Liquid Thin Liquid: Within functional limits    Nectar Thick Nectar Thick Liquid: Not tested   Honey Thick Honey  Thick Liquid: Not tested   Puree Puree: Within functional limits   Solid     Solid: Within functional limits      Juan Quam Laurice 05/24/2019,10:10 AM   Estill Bamberg L. Tivis Ringer, [place] Office number [phone] Pager [phone]

## 2019-05-24 NOTE — Progress Notes (Signed)
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  She has been followed by nephrology as an outpatient and she and her nephrologist discussed about dialysis however she has been refusing dialysis.  It appears that currently she is willing to do that in the ED she was found to be fluid overloaded with pulmonary edema, small bilateral pleural effusion and lower extremity swelling.  She was placed on IV Lasix.  Subjective / 24h Interval events: Feels about the same.  No new complaints.  Not engaging too much today  Assessment & Plan: Principal Problem Acute hypoxic respiratory failure in the setting of acute pulmonary edema multifactorial due to acute systolic CHF and progression of chronic kidney disease -appreciate nephrology assistance, she has been placed on high-dose IV Lasix 120 every 8.  Volume status appears to improve, she seems to be tolerating well, creatinine stable and overall improving  Active Problems Acute kidney injury on chronic kidney disease stage IV-nephrology consulted, currently on Lasix challenge.  Overall stable, does not need acute dialysis  Acute systolic CHF-appreciate cardiology follow-up, 2D echo done showed a depressed EF which is new.  She will need ischemic work-up, preferably during his hospital stay with cardiac catheterization however she is not committed to dialysis and this is being on hold.  Anemia of chronic renal disease-continue p.o. iron, hemoglobin overall  stable  Dysphagia-reported this morning, apparently it has been a problem in the past as well.  Per speech she did really well.  We will do an esophagogram  Elevated LFTs-possible congestion from fluid overload, monitor CMP  History of DVT-continue Eliquis  Rectal cancer- in remission  DM2-continue Januvia  Goals of care-still not engaging.  I offered to call her son to which patient responded "no" palliative so patient today as well, discussed with Dr. Rowe Pavy and not engaging with palliative team either  Scheduled Meds: . apixaban  5 mg Oral BID  . aspirin  81 mg Oral Daily  . atorvastatin  10 mg Oral QHS  . Chlorhexidine Gluconate Cloth  6 each Topical Daily  . darbepoetin (ARANESP) injection - NON-DIALYSIS  100 mcg Subcutaneous Q Wed-1800  . gabapentin  300 mg Oral BID  . iron polysaccharides  150 mg Oral BID  . levothyroxine  50 mcg Oral Daily  . linagliptin  5 mg Oral Daily  . metoprolol succinate  12.5 mg Oral Daily  . omeprazole  20 mg Oral Daily  . sodium chloride flush  10-40 mL Intracatheter Q12H   Continuous Infusions: . ferumoxytol 510 mg (05/22/19 1602)  . furosemide 120 mg (05/24/19 1051)   PRN Meds:.acetaminophen **OR** acetaminophen, albuterol, naphazoline-glycerin, ondansetron **OR** ondansetron (ZOFRAN) IV, sodium chloride flush, traMADol  DVT prophylaxis: Eliquis  Code Status: DNR Family Communication: no family at bedside   Status is: Inpatient  Remains inpatient appropriate because:Inpatient level of care appropriate due to severity of illness  Dispo: The patient is from: Home              Anticipated d/c is to: Home  Anticipated d/c date is: 3 days              Patient currently is not medically stable to d/c.  Consultants:  Nephrology   Procedures:  None   Microbiology  None   Antimicrobials: None     Objective: Vitals:   05/23/19 2136 05/24/19 0524 05/24/19 1033 05/24/19 1122  BP: 136/73 132/72 134/74   Pulse: 91 86 84    Resp: 18 18    Temp: 98.4 F (36.9 C) 98.7 F (37.1 C)    TempSrc: Oral Oral    SpO2: 100% 99%  93%  Weight:  68.6 kg    Height:        Intake/Output Summary (Last 24 hours) at 05/24/2019 1342 Last data filed at 05/24/2019 1118 Gross per 24 hour  Intake 698 ml  Output 1725 ml  Net -1027 ml   Filed Weights   05/22/19 1751 05/23/19 0527 05/24/19 0524  Weight: 71.3 kg 69.4 kg 68.6 kg    Examination:  Constitutional: No distress Eyes: No scleral icterus ENMT: Moist mucous membranes Neck: normal, supple Respiratory: Aromasin bibasilar crackles, no wheezing Cardiovascular: Regular rate and rhythm, no murmurs,, 1+ edema Abdomen: No distention, bowel sounds present Musculoskeletal: no clubbing / cyanosis.  Skin: No rashes Neurologic: Nonfocal  Data Reviewed: I have independently reviewed following labs and imaging studies   CBC: Recent Labs  Lab 05/21/19 1604 05/22/19 0500 05/23/19 0307 05/24/19 0627  WBC 3.2* 3.1* 2.9* 5.0  NEUTROABS 2.4  --   --   --   HGB 7.4* 7.8* 8.1* 7.8*  HCT 24.1* 25.7* 26.9* 25.7*  MCV 100.8* 100.0 101.9* 102.0*  PLT 307 319 324 884   Basic Metabolic Panel: Recent Labs  Lab 05/21/19 1604 05/21/19 2042 05/22/19 0500 05/23/19 0307 05/24/19 0627  NA 136  --  138 137 136  K 4.1  --  3.8 3.4* 3.0*  CL 107  --  108 105 104  CO2 17*  --  19* 22 24  GLUCOSE 153*  --  167* 184* 182*  BUN 73*  --  67* 64* 53*  CREATININE 3.29*  --  3.16* 2.81* 2.52*  CALCIUM 8.8*  --  8.9 8.7* 8.3*  MG  --  1.9  --   --   --    Liver Function Tests: Recent Labs  Lab 05/21/19 1604 05/22/19 0500 05/23/19 0307  AST 101* 85* 57*  ALT 187* 169* 136*  ALKPHOS 159* 143* 147*  BILITOT 0.6 0.5 0.6  PROT 6.2* 6.4* 6.1*  ALBUMIN 3.2* 3.3* 3.1*   Coagulation Profile: No results for input(s): INR, PROTIME in the last 168 hours. HbA1C: No results for input(s): HGBA1C in the last 72 hours. CBG: Recent Labs  Lab 05/21/19 1532  GLUCAP 137*    Recent  Results (from the past 240 hour(s))  SARS Coronavirus 2 by RT PCR (hospital order, performed in [facility] hospital lab) Nasopharyngeal Nasopharyngeal Swab     Status: None   Collection Time: 05/21/19  5:33 PM   Specimen: Nasopharyngeal Swab  Result Value Ref Range Status   SARS Coronavirus 2 NEGATIVE NEGATIVE Final    Comment: (NOTE) SARS-CoV-2 target nucleic acids are NOT DETECTED. The SARS-CoV-2 RNA is generally detectable in upper and lower respiratory specimens during the acute phase of infection. The lowest concentration of SARS-CoV-2 viral copies this assay can detect is 250 copies / mL. A negative result does not preclude SARS-CoV-2 infection and should not be used as the  sole basis for treatment or other patient management decisions.  A negative result may occur with improper specimen collection / handling, submission of specimen other than nasopharyngeal swab, presence of viral mutation(s) within the areas targeted by this assay, and inadequate number of viral copies (<250 copies / mL). A negative result must be combined with clinical observations, patient history, and epidemiological information. Fact Sheet for Patients:   StrictlyIdeas.no Fact Sheet for Healthcare Providers: BankingDealers.co.za This test is not yet approved or cleared  by the [place] FDA and has been authorized for detection and/or diagnosis of SARS-CoV-2 by FDA under an Emergency Use Authorization (EUA).  This EUA will remain in effect (meaning this test can be used) for the duration of the COVID-19 declaration under Section 564(b)(1) of the Act, 21 U.S.C. section 360bbb-3(b)(1), unless the authorization is terminated or revoked sooner. Performed at [facility], [place] [address] [place], [place] [address]      Radiology Studies: No results found. Marzetta Board, MD, PhD [facility]  Between 7 am - 7 pm I am available,  please contact me via Amion or Securechat  Between 7 pm - 7 am I am not available, please contact night coverage MD/APP via Amion

## 2019-05-25 DIAGNOSIS — N184 Chronic kidney disease, stage 4 (severe): Secondary | ICD-10-CM

## 2019-05-25 DIAGNOSIS — Z7189 Other specified counseling: Secondary | ICD-10-CM

## 2019-05-25 DIAGNOSIS — Z515 Encounter for palliative care: Secondary | ICD-10-CM

## 2019-05-25 DIAGNOSIS — R531 Weakness: Secondary | ICD-10-CM

## 2019-05-25 LAB — CBC
HCT: 26.2 % — ABNORMAL LOW (ref 36.0–46.0)
Hemoglobin: 7.9 g/dL — ABNORMAL LOW (ref 12.0–15.0)
MCH: 30.6 pg (ref 26.0–34.0)
MCHC: 30.2 g/dL (ref 30.0–36.0)
MCV: 101.6 fL — ABNORMAL HIGH (ref 80.0–100.0)
Platelets: 281 10*3/uL (ref 150–400)
RBC: 2.58 MIL/uL — ABNORMAL LOW (ref 3.87–5.11)
RDW: 19.2 % — ABNORMAL HIGH (ref 11.5–15.5)
WBC: 9.3 10*3/uL (ref 4.0–10.5)
nRBC: 0.4 % — ABNORMAL HIGH (ref 0.0–0.2)

## 2019-05-25 LAB — BASIC METABOLIC PANEL
Anion gap: 11 (ref 5–15)
BUN: 47 mg/dL — ABNORMAL HIGH (ref 8–23)
CO2: 22 mmol/L (ref 22–32)
Calcium: 8.5 mg/dL — ABNORMAL LOW (ref 8.9–10.3)
Chloride: 104 mmol/L (ref 98–111)
Creatinine, Ser: 2.33 mg/dL — ABNORMAL HIGH (ref 0.44–1.00)
GFR calc Af Amer: 24 mL/min — ABNORMAL LOW (ref >60)
GFR calc non Af Amer: 20 mL/min — ABNORMAL LOW (ref >60)
Glucose, Bld: 204 mg/dL — ABNORMAL HIGH (ref 70–99)
Potassium: 2.9 mmol/L — ABNORMAL LOW (ref 3.5–5.1)
Sodium: 137 mmol/L (ref 135–145)

## 2019-05-25 MED ORDER — POTASSIUM CHLORIDE 20 MEQ PO PACK
40.0000 meq | PACK | Freq: Two times a day (BID) | ORAL | Status: DC
Start: 2019-05-25 — End: 2019-05-29
  Administered 2019-05-25 – 2019-05-29 (×8): 40 meq via ORAL
  Filled 2019-05-25 (×9): qty 2

## 2019-05-25 MED ORDER — GABAPENTIN 100 MG PO CAPS
100.0000 mg | ORAL_CAPSULE | Freq: Every day | ORAL | Status: DC
  Administered 2019-05-26 – 2019-05-30 (×5): 100 mg via ORAL
  Filled 2019-05-25 (×5): qty 1

## 2019-05-25 NOTE — Progress Notes (Signed)
Pt had a three beat run of v-tach at 1330 this shift. Pt remained asymptomatic throughout and is in no acute distress. MD notified. Will continue to monitor

## 2019-05-25 NOTE — Plan of Care (Signed)

## 2019-05-25 NOTE — Progress Notes (Signed)
Placed purewick around 0200 in an attempt to get an accurate urine output as the pt was having some bouts of urgency incontinence.  Pt pulled it out sometime during the night and bed was soaked, RN and NA unable to get an accurate urine output measurment

## 2019-05-25 NOTE — Progress Notes (Signed)
Progress Note  Patient Name: Nicole Sellers Date of Encounter: 05/25/2019  Primary Cardiologist: Quay Burow, MD   Subjective   Reports she continued to have shortness of breath.  Net -2.8 L yesterday but with unmeasured UOP.  Received IV Lasix 120 mg 3 times daily.  Creatinine tenuous to improve (2.8 > 2.5 > 2.3).  BP stable at 132/72 this morning  Inpatient Medications    Scheduled Meds: . apixaban  5 mg Oral BID  . aspirin  81 mg Oral Daily  . atorvastatin  10 mg Oral QHS  . Chlorhexidine Gluconate Cloth  6 each Topical Daily  . darbepoetin (ARANESP) injection - NON-DIALYSIS  100 mcg Subcutaneous Q Wed-1800  . gabapentin  300 mg Oral BID  . iron polysaccharides  150 mg Oral BID  . levothyroxine  50 mcg Oral Daily  . linagliptin  5 mg Oral Daily  . metoprolol succinate  12.5 mg Oral Daily  . omeprazole  20 mg Oral Daily  . sodium chloride flush  10-40 mL Intracatheter Q12H   Continuous Infusions: . ferumoxytol 510 mg (05/22/19 1602)  . furosemide 120 mg (05/24/19 2116)   PRN Meds: acetaminophen **OR** acetaminophen, albuterol, naphazoline-glycerin, ondansetron **OR** ondansetron (ZOFRAN) IV, sodium chloride flush, traMADol   Vital Signs    Vitals:   05/24/19 1122 05/24/19 1300 05/24/19 2047 05/25/19 0518  BP:  136/70 126/73 132/72  Pulse:  88 (!) 106 (!) 108  Resp:  20 18 18   Temp:  97.6 F (36.4 C) (!) 100.4 F (38 C) 99.5 F (37.5 C)  TempSrc:  Oral    SpO2: 93% 94% 92% 92%  Weight:      Height:        Intake/Output Summary (Last 24 hours) at 05/25/2019 0735 Last data filed at 05/25/2019 0500 Gross per 24 hour  Intake 746 ml  Output 2000 ml  Net -1254 ml   Filed Weights   05/22/19 1751 05/23/19 0527 05/24/19 0524  Weight: 71.3 kg 69.4 kg 68.6 kg    Telemetry    Sinus rhythm rate 90-100s, PVCs  - Personally Reviewed  ECG    No new tracings - Personally Reviewed  Physical Exam   GEN: Lying in bed in no acute distress.   Neck: +  JVD Cardiac: RRR, no murmurs, rubs, or gallops.  Respiratory: decreased breath sounds at lung bases  GI: NABS, Soft, nontender, non-distended  MS: trace edema, no deformity. Neuro:  Nonfocal, moving all extremities spontaneously Psych: Normal affect   Labs    Chemistry Recent Labs  Lab 05/21/19 1604 05/21/19 1604 05/22/19 0500 05/22/19 0500 05/23/19 0307 05/24/19 0627 05/25/19 0341  NA 136   < > 138   < > 137 136 137  K 4.1   < > 3.8   < > 3.4* 3.0* 2.9*  CL 107   < > 108   < > 105 104 104  CO2 17*   < > 19*   < > 22 24 22   GLUCOSE 153*   < > 167*   < > 184* 182* 204*  BUN 73*   < > 67*   < > 64* 53* 47*  CREATININE 3.29*   < > 3.16*   < > 2.81* 2.52* 2.33*  CALCIUM 8.8*   < > 8.9   < > 8.7* 8.3* 8.5*  PROT 6.2*  --  6.4*  --  6.1*  --   --   ALBUMIN 3.2*  --  3.3*  --  3.1*  --   --   AST 101*  --  85*  --  57*  --   --   ALT 187*  --  169*  --  136*  --   --   ALKPHOS 159*  --  143*  --  147*  --   --   BILITOT 0.6  --  0.5  --  0.6  --   --   GFRNONAA 13*   < > 14*   < > 16* 19* 20*  GFRAA 16*   < > 16*   < > 19* 21* 24*  ANIONGAP 12   < > 11   < > 10 8 11    < > = values in this interval not displayed.     Hematology Recent Labs  Lab 05/23/19 0307 05/24/19 0627 05/25/19 0341  WBC 2.9* 5.0 9.3  RBC 2.64* 2.52* 2.58*  HGB 8.1* 7.8* 7.9*  HCT 26.9* 25.7* 26.2*  MCV 101.9* 102.0* 101.6*  MCH 30.7 31.0 30.6  MCHC 30.1 30.4 30.2  RDW 18.1* 18.6* 19.2*  PLT 324 271 281    Cardiac EnzymesNo results for input(s): TROPONINI in the last 168 hours. No results for input(s): TROPIPOC in the last 168 hours.   BNP Recent Labs  Lab 05/22/19 1633  BNP >4,500.0*     DDimer No results for input(s): DDIMER in the last 168 hours.   Radiology    No results found.  Cardiac Studies   Lexiscan Myoview 06/14/2018:  Nuclear stress EF: 56%.  The left ventricular ejection fraction is normal (55-65%).  There was no ST segment deviation noted during stress.  No T  wave inversion was noted during stress.  Defect 1: There is a small defect of mild severity present in the basal anterior location.  The study is normal.  This is a low risk study.  Low risk stress nuclear study with normal perfusion and normal left ventricular regional and global systolic function. _______________  Echocardiogram 05/22/2019: Impressions: 1. RVEF is moderately decreased.  2. Since the last study on 03/31/2019 there has been a significant change,  LVEF has decreased from 50-55% (with akinesis in the basal and mid  anteroseptal and anterior walls) to 15-20% with severe diffuse hypokinesis  and paradoxical septal motion.  3. There is no thrombus on the left ventricle (on Definity echo contrast  imaging).  4. Left ventricular ejection fraction, by estimation, is 20 to 25%. The  left ventricle has severely decreased function. The left ventricle  demonstrates global hypokinesis. The left ventricular internal cavity size  was moderately dilated. Left  ventricular diastolic parameters are consistent with Grade II diastolic  dysfunction (pseudonormalization). Elevated left atrial pressure. The  average left ventricular global longitudinal strain is -7.0 %.  5. Right ventricular systolic function is moderately reduced. The right  ventricular size is moderately enlarged. There is mildly elevated  pulmonary artery systolic pressure. The estimated right ventricular  systolic pressure is 94.1 mmHg.  6. Left atrial size was moderately dilated.  7. Right atrial size was mildly dilated.  8. The mitral valve is normal in structure. Moderate mitral valve  regurgitation. No evidence of mitral stenosis.  9. Tricuspid valve regurgitation is moderate.  10. The aortic valve is normal in structure. Aortic valve regurgitation is  mild. Mild to moderate aortic valve sclerosis/calcification is present,  without any evidence of aortic stenosis.  11. The inferior vena cava is  normal in size with greater than 50%  respiratory  variability, suggesting right atrial pressure of 3 mmHg.   Patient Profile     69 y.o. 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   Assessment & Plan    1. Acute combined CHF: patient presented with DOE, orthopnea, and edema. CXR/CT chest c/w CHF. BNP >4500. Echo revealed EF 15-20%, down from 50-55% 03/2019, with G2DD, moderate MR/TR, and no LV thrombus. EKG this admission revealed LBBB which may have been present since September.  Suspect ischemic cardiomyopathy given her PAD history. Unfortunately CKD stage 4-5 has limited evaluation (peak creatinine 3.3 on admission), as patient is not interested in the possibility of dialysis at this time.  However she was started on IV Lasix 120 mg 3 times daily per nephrology, and renal function is improving. - Continue diuresis per nephrology - Continue metoprolol succinate  - No ACE/ARB/ARNI given renal function - May be able to proceed with RHC/LHC if creatinine continues to improve  - Continue to monitor strict I&Os, daily weights, renal function, and electrolytes closely - Continue aspirin and statin for presumed CAD  2. PAD: s/p PTA/stent to the right SFA in 2014 due to ischemic toe and ultimately required 2nd toe amputation. She then underwent PTA/stenting to right external iliac artery in 04/2017. Most recent lower extremity dopplers in 03/2019 showed patent right SFA stent and 30-40% stenosis of  left common femoral artery as well as occlusion of the right peroneal artery, left SFA with reconstition in the distal segment, and left peroneal artery. Bilateral ABIs and TBIs decreased from 2019 studies. - Continue aspirin and statin - Continue outpatient follow-up with Dr. Gwenlyn Found  3. Carotid artery stenosis: mild right ICA and moderate left ICA on doppler 06/2018. Due for follow-up dopplers 06/2019 - Continue aspirin and statin - Continue routine outpatient monitoring  4. HTN: BP stable with addition of metoprolol succinate 12.5mg  daily yesterday. - Continue metoprolol succinate  5. HLD: LDL 53, at goal <70 - Continue atorvastatin   6. DVT: diagnosed 11/2018 but anticoagulation not initiated due to loss of follow-up. Subsequently started on eliquis 03/2019 - Would recommend transitioning from Eliquis to heparin in case invasive procedures planned  7. DM type 2: A1C 5.4 03/2019; at goal of <7 - Continue management per primary team.  8. CKD stage 4-5: Cr improved from 3.29 on admission to 2.3 today with IV diuresis. Nephrology following. Patient is not interested in dialysis unless it was a life or death issue.  - Continue to monitor closely  9. Anemia of chronic disease/iron deficiency: Hgb trough 6.6. Nephrology started on feraheme and aranesp. Hgb stable at 7.8 today - Continue management per primary team and nephrology  10. Transaminitis: likely 2/2 hepatic congestion. LFTs trending down.  - Continue to monitor.   For questions or updates, please contact [place] Please consult [email or web address] for contact info under Cardiology/STEMI.      Signed, Donato Heinz, MD  05/25/2019, 7:35 AM   [phone]

## 2019-05-25 NOTE — Progress Notes (Addendum)
[facility] Progress Note    Assessment/ Plan:   62F CKD4 presenting with hypervolemia, anemia, SOB/Weakness.  I think her presentation is multifactorial including a component of acute on chronic diastolic heart failure, persistent anemia, GFR probably lower than creatinine suggest based upon muscle mass with potential some uremia.   Plan 1. AKI on CKD IV: Increased lasix to 120 IV TID--> good diuresis, continue for today Cr is improving to 2.8--> 2.5-->2.3 today.  Anticipate transitioning to oral Lasix in 1-2 days.  She was on 60 mg QOD at home, would recommend seeing if she can maintain weights once euvolemia has been achieved at Lasix 80 TID or torsemide 60 mg BID 2. Acute combined CHF: EF 15-20%, cardiology following, considering L and R HC 3. Hypokalemia: replete, have added 40 BID for now--> watch closely, may need to reduce 4. Anemia of CKD: S/p Aranesp 100 mcg 5/13 and Feraheme 510 mg IV x 5/13 5. H/o DVT: On Eliquis for now 6. No HD needed currently, she would possibly pursue if indicated 7. Would consult palliative to clarify GOC--> per notes, pt not willing to engage, will table that discussion 8. Daily weights, Daily Renal Panel, Strict I/Os, Avoid nephrotoxins (NSAIDs, judicious IV Contrast)  9. Sleepiness: have reduced gabapentin to 100 mg QHS from 300 mg BID 10. Dispo: nothing further to add at present- will sign off for now.  Call with questions  Subjective:    Cr continues to improve, diuresing.  Cardiology planning for L and RHC if Cr continues to improve    Objective:   BP 132/72 (BP Location: Right Arm)   Pulse (!) 108   Temp 99.5 F (37.5 C)   Resp 18   Ht 5\' 6"  (1.676 m)   Wt 68.6 kg   SpO2 92%   BMI 24.40 kg/m   Intake/Output Summary (Last 24 hours) at 05/25/2019 1139 Last data filed at 05/25/2019 0500 Gross per 24 hour  Intake 306 ml  Output 1400 ml  Net -1094 ml   Weight change:   Physical Exam: Gen: NAD CVS: RRR Resp: wearing  O2, some muffled basilar sounds Abd: soft Ext: 1+ edema, some skin wrinkling, improving  Imaging: No results found.  Labs: BMET Recent Labs  Lab 05/21/19 1604 05/22/19 0500 05/23/19 0307 05/24/19 0627 05/25/19 0341  NA 136 138 137 136 137  K 4.1 3.8 3.4* 3.0* 2.9*  CL 107 108 105 104 104  CO2 17* 19* 22 24 22   GLUCOSE 153* 167* 184* 182* 204*  BUN 73* 67* 64* 53* 47*  CREATININE 3.29* 3.16* 2.81* 2.52* 2.33*  CALCIUM 8.8* 8.9 8.7* 8.3* 8.5*   CBC Recent Labs  Lab 05/21/19 1604 05/21/19 1604 05/22/19 0500 05/23/19 0307 05/24/19 0627 05/25/19 0341  WBC 3.2*   < > 3.1* 2.9* 5.0 9.3  NEUTROABS 2.4  --   --   --   --   --   HGB 7.4*   < > 7.8* 8.1* 7.8* 7.9*  HCT 24.1*   < > 25.7* 26.9* 25.7* 26.2*  MCV 100.8*   < > 100.0 101.9* 102.0* 101.6*  PLT 307   < > 319 324 271 281   < > = values in this interval not displayed.    Medications:    . apixaban  5 mg Oral BID  . aspirin  81 mg Oral Daily  . atorvastatin  10 mg Oral QHS  . Chlorhexidine Gluconate Cloth  6 each Topical Daily  . darbepoetin (ARANESP)  injection - NON-DIALYSIS  100 mcg Subcutaneous Q Wed-1800  . [START ON 05/26/2019] gabapentin  100 mg Oral QHS  . iron polysaccharides  150 mg Oral BID  . levothyroxine  50 mcg Oral Daily  . linagliptin  5 mg Oral Daily  . metoprolol succinate  12.5 mg Oral Daily  . omeprazole  20 mg Oral Daily  . potassium chloride  40 mEq Oral BID  . sodium chloride flush  10-40 mL Intracatheter Q12H      Madelon Lips, MD 05/25/2019, 11:39 AM

## 2019-05-25 NOTE — Progress Notes (Signed)
Daily Progress Note   Patient Name: Nicole Sellers       Date: 05/25/2019 DOB: May 24, 1950  Age: 69 y.o. MRN#: [id number] Attending Physician: Caren Griffins, MD Primary Care Physician: Reynold Bowen, MD Admit Date: 05/21/2019  Reason for Consultation/Follow-up: Establishing goals of care  Subjective:  resting in bed, ate some breakfast, appears more tired/chronically ill appearing than yesterday. No family at bedside.    Length of Stay: 4  Current Medications: Scheduled Meds:  . apixaban  5 mg Oral BID  . aspirin  81 mg Oral Daily  . atorvastatin  10 mg Oral QHS  . Chlorhexidine Gluconate Cloth  6 each Topical Daily  . darbepoetin (ARANESP) injection - NON-DIALYSIS  100 mcg Subcutaneous Q Wed-1800  . [START ON 05/26/2019] gabapentin  100 mg Oral QHS  . iron polysaccharides  150 mg Oral BID  . levothyroxine  50 mcg Oral Daily  . linagliptin  5 mg Oral Daily  . metoprolol succinate  12.5 mg Oral Daily  . omeprazole  20 mg Oral Daily  . potassium chloride  40 mEq Oral BID  . sodium chloride flush  10-40 mL Intracatheter Q12H    Continuous Infusions: . ferumoxytol 510 mg (05/22/19 1602)  . furosemide 120 mg (05/25/19 1005)    PRN Meds: acetaminophen **OR** acetaminophen, albuterol, naphazoline-glycerin, ondansetron **OR** ondansetron (ZOFRAN) IV, sodium chloride flush, traMADol  Physical Exam         Lying in bed, no acute distress, appears chronically ill Decreased breath sounds at bases S1-S2 Abdomen is not distended Trace edema Awake alert nonfocal  Vital Signs: BP 132/72 (BP Location: Right Arm)   Pulse (!) 108   Temp 99.5 F (37.5 C)   Resp 18   Ht 5\' 6"  (1.676 m)   Wt 68.6 kg   SpO2 92%   BMI 24.40 kg/m  SpO2: SpO2: 92 % O2 Device: O2 Device: Room  Air O2 Flow Rate: O2 Flow Rate (L/min): 1 L/min  Intake/output summary:   Intake/Output Summary (Last 24 hours) at 05/25/2019 1140 Last data filed at 05/25/2019 0500 Gross per 24 hour  Intake 306 ml  Output 1400 ml  Net -1094 ml   LBM: Last BM Date: 05/25/19 Baseline Weight: Weight: 71.3 kg Most recent weight: Weight: 68.6 kg       Palliative  Assessment/Data:      Patient Active Problem List   Diagnosis Date Noted  . CKD (chronic kidney disease) stage 4, GFR 15-29 ml/min (HCC) 05/21/2019  . Fluid overload 05/21/2019  . New onset of congestive heart failure ([place]) 05/21/2019  . Anemia of chronic disease 05/21/2019  . Transaminitis 05/21/2019  . Acute kidney injury ([place]) 03/29/2019  . DVT (deep venous thrombosis) ([place]) 03/29/2019  . Oral candidiasis   . AKI (acute kidney injury) ([place]) 10/01/2018  . Hypokalemia 10/01/2018  . Pyuria 10/01/2018  . Odynophagia 10/01/2018  . Hyponatremia 10/01/2018  . Right sided weakness 10/01/2018  . SBO (small bowel obstruction) ([place]) 08/22/2018  . Dehydration 08/21/2018  . Pre-operative cardiovascular examination 06/07/2018  . Rectal cancer ([place]) 11/06/2017  . Claudication in peripheral vascular disease ([place]) 05/08/2017  . Carotid artery disease ([place]) 04/14/2017  . PAD (peripheral artery disease) Rt ABI 0.7, Lt ABI 0.46 12/25/2012  . S/P arterial stent, 12/25/13, successful diamondback orbital rotational arthrectomy, PTA using chocolate  balloon and stenting using I DEV stent of long segment calcified high-grade proximal and mid r 12/25/2012  . Gangrene of toe, Rt second toe 12/25/2012  . Essential hypertension 12/13/2012  . Non-insulin treated type 2 diabetes mellitus ([place]) 12/13/2012  . Critical lower limb ischemia 12/13/2012  . Tobacco abuse 12/13/2012    Palliative Care Assessment & Plan   Patient Profile:    Assessment:  69 y.o. female  with past medical history of   PAD s/p stenting, carotid artery disease, DVT, DM, HTN,  CKD IV-v, history of rectal cancer admitted on 05/21/2019 with acute combined CHF .     Patient remains admitted to hospital medicine service for acute hypoxic resp failure, acute pulmonary edema, acute systolic CHF and for progression of her CKD. Cardiology and renal medicine colleagues are also following.   Palliative consultation for goals of care.  Recommendations/Plan: Family meeting was held with Nicole Sellers at the bedside today by the undersigned.  No family was present at the bedside.  We talked about serious life limiting illnesses that the patient is facing particularly acute combined congestive heart failure as well as progression of her stage IV-5 chronic kidney disease.  In addition, she has underlying peripheral arterial disease carotid artery stenosis hypertension and dyslipidemia. Goals wishes and values important to the patient attempted to be explored.  She states that she appreciates all the medical information she is being given.  She recounts that she has been asked to make a decision on whether or not she would want dialysis.  She recalls that doctors have been talking to her about another heart procedure as well (cardiac catheterization.). Patient states that she does not consent to either dialysis or to cardiac catheterization.  She is accepting of as much medical management as can be given in this hospitalization.  We discussed about the serious irreversible nature of the conditions affecting her heart and kidneys.  We discussed about pros and cons of procedures such as cardiac catheterization and dialysis.  Patient continues to endorse that she does not want procedures as mentioned above.  She is accepting of medical management.  We discussed about hospice philosophy of care, optimal medical management and addition of hospice with the goal being to maximize comfort dignity and symptom management rather than life prolongation and use of procedures.  We talked about appropriate  disposition.  Home with hospice versus skilled nursing facility rehabilitation attempt with palliative services following was discussed and explained in detail to the patient.  She will continue to think about it.  I offered again to check in with her family regarding the above.  Patient states that her only family is her son and he is aware of her wishes.  At this time, she does not wish for me to get in touch with him.  Code Status:    Code Status Orders  (From admission, onward)         Start     Ordered   05/21/19 2044  Do not attempt resuscitation (DNR)  Continuous    Question Answer Comment  In the event of cardiac or respiratory ARREST Do not call a "code blue"   In the event of cardiac or respiratory ARREST Do not perform Intubation, CPR, defibrillation or ACLS   In the event of cardiac or respiratory ARREST Use medication by any route, position, wound care, and other measures to relive pain and suffering. May use oxygen, suction and manual treatment of airway obstruction as needed for comfort.      05/21/19 2045        Code Status History    Date Active Date Inactive Code Status Order ID Comments User Context   04/02/2019 1149 04/04/2019 2359 DNR [id number]  Jimmy Footman, NP Inpatient   03/29/2019 2317 04/02/2019 1149 Full Code [id number]  Etta Quill, DO ED   10/02/2018 0022 10/06/2018 2252 Full Code [id number]  Toy Baker, MD Inpatient   08/21/2018 1909 09/02/2018 1541 Full Code [id number]  Leighton Ruff, MD Inpatient   08/21/2018 1352 08/21/2018 1753 Full Code [id number]  Leighton Ruff, MD Outpatient   08/03/2018 1609 08/09/2018 2130 Full Code [id number]  Leighton Ruff, MD Inpatient   05/08/2017 1008 05/09/2017 1501 Full Code [id number]  Lorretta Harp, MD Inpatient   Advance Care Planning Activity    Advance Directive Documentation     Most Recent Value  Type of Advance Directive  Out of facility DNR (pink MOST or yellow form) [pt states she left that at  home]  Pre-existing out of facility DNR order (yellow form or pink MOST form)  --  "MOST" Form in Place?  --      Prognosis:  Unable to determine Guarded. Likely less than 1 year? Decline could be accelerated if patient has ongoing worsening of her renal function, her cardiac condition etc. She is aware.   Discharge Planning: [facility] for rehab with Palliative care service follow-up versus home with hospice, depending on hospital course and patient's wishes.   Care plan was discussed with  Patient, TRH MD.   Thank you for allowing the Palliative Medicine Team to assist in the care of this patient.   Time In:  10 Time Out: 10.35 Total Time 35 Prolonged Time Billed  no       Greater than 50%  of this time was spent counseling and coordinating care related to the above assessment and plan.  Loistine Chance, MD  Please contact Palliative Medicine Team phone at [phone] for questions and concerns.

## 2019-05-25 NOTE — Progress Notes (Signed)
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  She has been followed by nephrology as an outpatient and she and her nephrologist discussed about dialysis however she has been refusing dialysis.  It appears that currently she is willing to do that in the ED she was found to be fluid overloaded with pulmonary edema, small bilateral pleural effusion and lower extremity swelling.  She was placed on IV Lasix.  Subjective / 24h Interval events: Feels about the same.  No new complaints.  Not engaging too much today  Assessment & Plan: Principal Problem Acute hypoxic respiratory failure in the setting of acute pulmonary edema multifactorial due to acute systolic CHF and progression of chronic kidney disease -appreciate nephrology assistance, she has been placed on high-dose IV Lasix 120 every 8.  Volume status is better, urinating well  Active Problems Acute kidney injury on chronic kidney disease stage IV-nephrology consulted, currently on Lasix challenge.  Creatinine is actually improving.  Discussed with Dr. Hollie Salk, may transition to oral diuretics within the next 1 to 2 days  Acute systolic CHF-appreciate cardiology follow-up, 2D echo done showed a depressed EF which is new.  She will need ischemic work-up, preferably during his hospital stay with cardiac catheterization however she is not committed to dialysis.  Palliative followed up today, discussed with Dr. Rowe Pavy, patient told palliative that she does not wish dialysis  nor cardiac catheterization.  Discussed with Dr. Gardiner Rhyme as well  Anemia of chronic renal disease-continue p.o. iron, hemoglobin overall stable  Dysphagia-reported this morning, apparently it has been a problem in the past as well.  Per speech she did really well.   Elevated LFTs-possible congestion from fluid overload, monitor CMP  History of DVT-continue Eliquis  Rectal cancer- in remission  DM2-continue Januvia  Goals of care-difficult to engage patient, I was unsuccessful.  Palliative discussed with her today and patient wishes for no dialysis and no cardiac cath  Scheduled Meds: . apixaban  5 mg Oral BID  . aspirin  81 mg Oral Daily  . atorvastatin  10 mg Oral QHS  . Chlorhexidine Gluconate Cloth  6 each Topical Daily  . darbepoetin (ARANESP) injection - NON-DIALYSIS  100 mcg Subcutaneous Q Wed-1800  . [START ON 05/26/2019] gabapentin  100 mg Oral QHS  . iron polysaccharides  150 mg Oral BID  . levothyroxine  50 mcg Oral Daily  . linagliptin  5 mg Oral Daily  . metoprolol succinate  12.5 mg Oral Daily  . omeprazole  20 mg Oral Daily  . potassium chloride  40 mEq Oral BID  . sodium chloride flush  10-40 mL Intracatheter Q12H   Continuous Infusions: . ferumoxytol 510 mg (05/22/19 1602)  . furosemide 120 mg (05/25/19 1005)   PRN Meds:.acetaminophen **OR** acetaminophen, albuterol, naphazoline-glycerin, ondansetron **OR** ondansetron (ZOFRAN) IV, sodium chloride flush, traMADol  DVT prophylaxis: Eliquis  Code Status: DNR Family Communication: no family at bedside   Status is: Inpatient  Remains inpatient appropriate because:Inpatient level of care appropriate due to severity of illness  Dispo: The patient is from: Home  Anticipated d/c is to: Home              Anticipated d/c date is: 3 days              Patient currently is not medically stable to d/c.  Consultants:  Nephrology Cardiology Palliative care  Procedures:  None   Microbiology  None    Antimicrobials: None     Objective: Vitals:   05/24/19 1122 05/24/19 1300 05/24/19 2047 05/25/19 0518  BP:  136/70 126/73 132/72  Pulse:  88 (!) 106 (!) 108  Resp:  20 18 18   Temp:  97.6 F (36.4 C) (!) 100.4 F (38 C) 99.5 F (37.5 C)  TempSrc:  Oral    SpO2: 93% 94% 92% 92%  Weight:      Height:        Intake/Output Summary (Last 24 hours) at 05/25/2019 1250 Last data filed at 05/25/2019 0500 Gross per 24 hour  Intake 306 ml  Output 1400 ml  Net -1094 ml   Filed Weights   05/22/19 1751 05/23/19 0527 05/24/19 0524  Weight: 71.3 kg 69.4 kg 68.6 kg    Examination:  Constitutional: NAD Eyes: No icterus ENMT: Moist membranes Neck: normal, supple Respiratory: Faint bibasilar crackles, no wheezing Cardiovascular: Regular rate and rhythm, no murmurs, 1+ edema-much improved Abdomen: Soft, nontender, nondistended, bowel sounds positive Musculoskeletal: no clubbing / cyanosis.  Skin: No rashes seen Neurologic: No focal deficits  Data Reviewed: I have independently reviewed following labs and imaging studies   CBC: Recent Labs  Lab 05/21/19 1604 05/22/19 0500 05/23/19 0307 05/24/19 0627 05/25/19 0341  WBC 3.2* 3.1* 2.9* 5.0 9.3  NEUTROABS 2.4  --   --   --   --   HGB 7.4* 7.8* 8.1* 7.8* 7.9*  HCT 24.1* 25.7* 26.9* 25.7* 26.2*  MCV 100.8* 100.0 101.9* 102.0* 101.6*  PLT 307 319 324 271 867   Basic Metabolic Panel: Recent Labs  Lab 05/21/19 1604 05/21/19 2042 05/22/19 0500 05/23/19 0307 05/24/19 0627 05/25/19 0341  NA 136  --  138 137 136 137  K 4.1  --  3.8 3.4* 3.0* 2.9*  CL 107  --  108 105 104 104  CO2 17*  --  19* 22 24 22   GLUCOSE 153*  --  167* 184* 182* 204*  BUN 73*  --  67* 64* 53* 47*  CREATININE 3.29*  --  3.16* 2.81* 2.52* 2.33*  CALCIUM 8.8*  --  8.9 8.7* 8.3* 8.5*  MG  --  1.9  --   --   --   --    Liver Function Tests: Recent Labs  Lab 05/21/19 1604 05/22/19 0500 05/23/19 0307  AST 101* 85* 57*  ALT 187* 169* 136*  ALKPHOS  159* 143* 147*  BILITOT 0.6 0.5 0.6  PROT 6.2* 6.4* 6.1*  ALBUMIN 3.2* 3.3* 3.1*   Coagulation Profile: No results for input(s): INR, PROTIME in the last 168 hours. HbA1C: No results for input(s): HGBA1C in the last 72 hours. CBG: Recent Labs  Lab 05/21/19 1532  GLUCAP 137*    Recent Results (from the past 240 hour(s))  SARS Coronavirus 2 by RT PCR (hospital order, performed in [facility] hospital lab) Nasopharyngeal Nasopharyngeal Swab     Status: None   Collection Time: 05/21/19  5:33 PM   Specimen: Nasopharyngeal Swab  Result Value Ref Range Status   SARS Coronavirus 2 NEGATIVE NEGATIVE Final    Comment: (NOTE) SARS-CoV-2 target nucleic acids are NOT  DETECTED. The SARS-CoV-2 RNA is generally detectable in upper and lower respiratory specimens during the acute phase of infection. The lowest concentration of SARS-CoV-2 viral copies this assay can detect is 250 copies / mL. A negative result does not preclude SARS-CoV-2 infection and should not be used as the sole basis for treatment or other patient management decisions.  A negative result may occur with improper specimen collection / handling, submission of specimen other than nasopharyngeal swab, presence of viral mutation(s) within the areas targeted by this assay, and inadequate number of viral copies (<250 copies / mL). A negative result must be combined with clinical observations, patient history, and epidemiological information. Fact Sheet for Patients:   StrictlyIdeas.no Fact Sheet for Healthcare Providers: BankingDealers.co.za This test is not yet approved or cleared  by the [place] FDA and has been authorized for detection and/or diagnosis of SARS-CoV-2 by FDA under an Emergency Use Authorization (EUA).  This EUA will remain in effect (meaning this test can be used) for the duration of the COVID-19 declaration under Section 564(b)(1) of the Act, 21  U.S.C. section 360bbb-3(b)(1), unless the authorization is terminated or revoked sooner. Performed at [facility], [place] [address] [place], [place] [address]      Radiology Studies: No results found. Marzetta Board, MD, PhD [facility]  Between 7 am - 7 pm I am available, please contact me via Amion or Securechat  Between 7 pm - 7 am I am not available, please contact night coverage MD/APP via Amion

## 2019-05-26 LAB — BASIC METABOLIC PANEL
Anion gap: 9 (ref 5–15)
BUN: 50 mg/dL — ABNORMAL HIGH (ref 8–23)
CO2: 26 mmol/L (ref 22–32)
Calcium: 8.6 mg/dL — ABNORMAL LOW (ref 8.9–10.3)
Chloride: 105 mmol/L (ref 98–111)
Creatinine, Ser: 2.59 mg/dL — ABNORMAL HIGH (ref 0.44–1.00)
GFR calc Af Amer: 21 mL/min — ABNORMAL LOW (ref >60)
GFR calc non Af Amer: 18 mL/min — ABNORMAL LOW (ref >60)
Glucose, Bld: 213 mg/dL — ABNORMAL HIGH (ref 70–99)
Potassium: 3.8 mmol/L (ref 3.5–5.1)
Sodium: 140 mmol/L (ref 135–145)

## 2019-05-26 LAB — CBC
HCT: 25.6 % — ABNORMAL LOW (ref 36.0–46.0)
Hemoglobin: 7.7 g/dL — ABNORMAL LOW (ref 12.0–15.0)
MCH: 30.6 pg (ref 26.0–34.0)
MCHC: 30.1 g/dL (ref 30.0–36.0)
MCV: 101.6 fL — ABNORMAL HIGH (ref 80.0–100.0)
Platelets: 246 10*3/uL (ref 150–400)
RBC: 2.52 MIL/uL — ABNORMAL LOW (ref 3.87–5.11)
RDW: 18.6 % — ABNORMAL HIGH (ref 11.5–15.5)
WBC: 7.1 10*3/uL (ref 4.0–10.5)
nRBC: 0.4 % — ABNORMAL HIGH (ref 0.0–0.2)

## 2019-05-26 MED ORDER — METOPROLOL SUCCINATE ER 25 MG PO TB24
25.0000 mg | ORAL_TABLET | Freq: Every day | ORAL | Status: DC
  Administered 2019-05-26 – 2019-05-30 (×5): 25 mg via ORAL
  Filled 2019-05-26 (×5): qty 1

## 2019-05-26 MED ORDER — TORSEMIDE 20 MG PO TABS
60.0000 mg | ORAL_TABLET | Freq: Two times a day (BID) | ORAL | Status: DC
  Administered 2019-05-26 – 2019-05-28 (×4): 60 mg via ORAL
  Filled 2019-05-26 (×6): qty 3

## 2019-05-26 NOTE — Progress Notes (Signed)
Daily Progress Note   Patient Name: Nicole Sellers       Date: 05/26/2019 DOB: [birth year]  Age: 69 y.o. MRN#: [id number] Attending Physician: Caren Griffins, MD Primary Care Physician: Reynold Bowen, MD Admit Date: 05/21/2019  Reason for Consultation/Follow-up: Establishing goals of care  Subjective: Nicole Sellers appears more tired than yesterday.  She says, "I know that I am very sick and I know the implications of it.  It is what it is ."     Length of Stay: 5  Current Medications: Scheduled Meds:  . apixaban  5 mg Oral BID  . aspirin  81 mg Oral Daily  . atorvastatin  10 mg Oral QHS  . Chlorhexidine Gluconate Cloth  6 each Topical Daily  . darbepoetin (ARANESP) injection - NON-DIALYSIS  100 mcg Subcutaneous Q Wed-1800  . gabapentin  100 mg Oral QHS  . iron polysaccharides  150 mg Oral BID  . levothyroxine  50 mcg Oral Daily  . linagliptin  5 mg Oral Daily  . metoprolol succinate  25 mg Oral Daily  . omeprazole  20 mg Oral Daily  . potassium chloride  40 mEq Oral BID  . sodium chloride flush  10-40 mL Intracatheter Q12H    Continuous Infusions: . ferumoxytol 510 mg (05/22/19 1602)  . furosemide 120 mg (05/26/19 1035)    PRN Meds: acetaminophen **OR** acetaminophen, albuterol, naphazoline-glycerin, ondansetron **OR** ondansetron (ZOFRAN) IV, sodium chloride flush, traMADol  Physical Exam         Lying in bed, no acute distress, appears chronically ill Decreased breath sounds at bases S1-S2 Abdomen is not distended Trace edema Awake alert nonfocal  Vital Signs: BP 118/71 (BP Location: Right Arm)   Pulse (!) 103   Temp 99.3 F (37.4 C)   Resp 17   Ht 5\' 6"  (1.676 m)   Wt 68.6 kg   SpO2 98%   BMI 24.40 kg/m  SpO2: SpO2: 98 % O2 Device: O2 Device: Nasal  Cannula O2 Flow Rate: O2 Flow Rate (L/min): 1 L/min  Intake/output summary:   Intake/Output Summary (Last 24 hours) at 05/26/2019 1116 Last data filed at 05/26/2019 1102 Gross per 24 hour  Intake 366 ml  Output 1350 ml  Net -984 ml   LBM: Last BM Date: 05/26/19 Baseline Weight: Weight: 71.3 kg Most recent weight:  Weight: 68.6 kg       Palliative Assessment/Data:      Patient Active Problem List   Diagnosis Date Noted  . Palliative care by specialist   . Goals of care, counseling/discussion   . General weakness   . CKD (chronic kidney disease) stage 4, GFR 15-29 ml/min (HCC) 05/21/2019  . Fluid overload 05/21/2019  . New onset of congestive heart failure ([place]) 05/21/2019  . Anemia of chronic disease 05/21/2019  . Transaminitis 05/21/2019  . Acute kidney injury ([place]) 03/29/2019  . DVT (deep venous thrombosis) ([place]) 03/29/2019  . Oral candidiasis   . AKI (acute kidney injury) ([place]) 10/01/2018  . Hypokalemia 10/01/2018  . Pyuria 10/01/2018  . Odynophagia 10/01/2018  . Hyponatremia 10/01/2018  . Right sided weakness 10/01/2018  . SBO (small bowel obstruction) ([place]) 08/22/2018  . Dehydration 08/21/2018  . Pre-operative cardiovascular examination 06/07/2018  . Rectal cancer ([place]) 11/06/2017  . Claudication in peripheral vascular disease ([place]) 05/08/2017  . Carotid artery disease ([place]) 04/14/2017  . PAD (peripheral artery disease) Rt ABI 0.7, Lt ABI 0.46 12/25/2012  . S/P arterial stent, 12/25/13, successful diamondback orbital rotational arthrectomy, PTA using chocolate  balloon and stenting using I DEV stent of long segment calcified high-grade proximal and mid r 12/25/2012  . Gangrene of toe, Rt second toe 12/25/2012  . Essential hypertension 12/13/2012  . Non-insulin treated type 2 diabetes mellitus ([place]) 12/13/2012  . Critical lower limb ischemia 12/13/2012  . Tobacco abuse 12/13/2012    Palliative Care Assessment & Plan   Patient Profile:    Assessment:  69  y.o. female  with past medical history of   PAD s/p stenting, carotid artery disease, DVT, DM, HTN, CKD IV-v, history of rectal cancer admitted on 05/21/2019 with acute combined CHF .     Patient remains admitted to hospital medicine service for acute hypoxic resp failure, acute pulmonary edema, acute systolic CHF and for progression of her CKD. Cardiology and renal medicine colleagues are also following.   Palliative consultation for goals of care.  Recommendations/Plan: Patient continues to endorse her wishes for gentle medical treatment, avoiding surgeries and procedures.  Continues to state that she does not want dialysis.  Today, we discussed about her ongoing decline and I gave her a little bit more information about hospice.  Today she was accepting of me to call and discus with her son, call placed but unable to reach Brock Bad at this time.  Follow hospital course and overall disease trajectory.  If the patient has ongoing fatigue and decline then in my opinion she will be better served by residential hospice rather than SNF rehab attempt.  I have discussed this with the patient today.  Code Status:    Code Status Orders  (From admission, onward)         Start     Ordered   05/21/19 2044  Do not attempt resuscitation (DNR)  Continuous    Question Answer Comment  In the event of cardiac or respiratory ARREST Do not call a "code blue"   In the event of cardiac or respiratory ARREST Do not perform Intubation, CPR, defibrillation or ACLS   In the event of cardiac or respiratory ARREST Use medication by any route, position, wound care, and other measures to relive pain and suffering. May use oxygen, suction and manual treatment of airway obstruction as needed for comfort.      05/21/19 2045        Code Status History  Date Active Date Inactive Code Status Order ID Comments User Context   04/02/2019 1149 04/04/2019 2359 DNR [id number]  Jimmy Footman, NP  Inpatient   03/29/2019 2317 04/02/2019 1149 Full Code [id number]  Etta Quill, DO ED   10/02/2018 0022 10/06/2018 2252 Full Code [id number]  Toy Baker, MD Inpatient   08/21/2018 1909 09/02/2018 1541 Full Code [id number]  Leighton Ruff, MD Inpatient   08/21/2018 1352 08/21/2018 1753 Full Code [id number]  Leighton Ruff, MD Outpatient   08/03/2018 1609 08/09/2018 2130 Full Code [id number]  Leighton Ruff, MD Inpatient   05/08/2017 1008 05/09/2017 1501 Full Code [id number]  Lorretta Harp, MD Inpatient   Advance Care Planning Activity    Advance Directive Documentation     Most Recent Value  Type of Advance Directive  Out of facility DNR (pink MOST or yellow form) [pt states she left that at home]  Pre-existing out of facility DNR order (yellow form or pink MOST form)  --  "MOST" Form in Place?  --      Prognosis:  Unable to determine Guarded.  Decline could be accelerated if patient has ongoing worsening of her renal function, her cardiac condition etc. She is aware.   Discharge Planning: [place] for rehab with Palliative care service follow-up versus residential hospice, depending on hospital course and patient's wishes.   Care plan was discussed with  Patient   Thank you for allowing the Palliative Medicine Team to assist in the care of this patient.   Time In:  10 Time Out: 10.35 Total Time 35 Prolonged Time Billed  no       Greater than 50%  of this time was spent counseling and coordinating care related to the above assessment and plan.  Loistine Chance, MD  Please contact Palliative Medicine Team phone at [phone] for questions and concerns.

## 2019-05-26 NOTE — Evaluation (Signed)
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  Clinical Impression  Pt presents with dependencies in mobility secondary to the above diagnosis. Pt will benefit from acute skilled PT to maximize mobility and Independence for possible return home. Pt will need 24 hour assistance with mobility. Per the chart pt may d/c to SNF or home with hospice. At this present time recommend home with family assistance and PT services. Pt will continue to benefit from acute skilled PT to maximize mobility and Independence.     Follow Up Recommendations Home health PT;Supervision/Assistance - 24 hour    Equipment Recommendations  None recommended by PT    Recommendations for Other Services       Precautions / Restrictions Precautions Precautions: Fall Restrictions Weight Bearing Restrictions: No      Mobility  Bed Mobility Overal bed mobility: Needs Assistance Bed Mobility: Supine to Sit     Supine to sit: Min assist;HOB elevated     General bed mobility comments: cues for technique, trunk support  Transfers Overall transfer level: Needs assistance Equipment used: Rolling walker (2 wheeled) Transfers: Sit to/from Stand Sit to Stand: Min assist;From elevated surface         General transfer comment: cues for hand placement, increasing forward trunk flexion  Ambulation/Gait Ambulation/Gait assistance: Min assist Gait Distance (Feet): 2 Feet Assistive device: Rolling walker (2 wheeled)   Gait velocity:  decreased   General Gait Details: side stepped to HOB, o2 at 1 liters o2 sats 94% rest breaks throughtout session  Stairs            Wheelchair Mobility    Modified Rankin (Stroke Patients Only)       Balance Overall balance assessment: Needs assistance Sitting-balance support: Bilateral upper extremity supported;Feet supported Sitting balance-Leahy Scale: Good     Standing balance support: Bilateral upper extremity supported Standing balance-Leahy Scale: Poor                               Pertinent Vitals/Pain Pain Assessment: Faces Faces Pain Scale: Hurts little more Pain Location: right knee Pain Descriptors / Indicators: Discomfort Pain Intervention(s): Limited activity within patient's tolerance;Repositioned;Monitored during session    Home Living Family/patient expects to be discharged to:: Private residence   Available Help at Discharge: Family;Available PRN/intermittently Type of Home: Apartment Home Access: Level entry     Home Layout: One level Home Equipment: Shower seat;Cane - single point;Walker - 2 wheels Additional Comments: pt lives on 4th floor of senior apartment residence, there is an Media planner    Prior Function Level of Independence: Independent with assistive device(s)               Hand Dominance        Extremity/Trunk Assessment   Upper Extremity Assessment Upper Extremity Assessment: Defer to OT evaluation    Lower Extremity Assessment Lower Extremity Assessment: Generalized weakness       Communication   Communication: No difficulties  Cognition Arousal/Alertness: Awake/alert Behavior During Therapy: Flat affect Overall Cognitive Status: Within  Functional Limits for tasks assessed                                        General Comments General comments (skin integrity, edema, etc.): decreased endurance, rest breaks for Dyspnea, o2 sats 94% throughout session, pt motivated to work with  therapy to improve activity tolerance.    Exercises General Exercises - Lower Extremity Ankle Circles/Pumps: AROM;Strengthening;Both;10 reps;Supine Long Arc Quad: AAROM;Strengthening;Both;10 reps;Seated Heel Slides: AAROM;Strengthening;Both;10 reps;Seated Hip ABduction/ADduction: AAROM;Strengthening;Both;10 reps;Supine   Assessment/Plan    PT Assessment Patient needs continued PT services  PT Problem List Decreased strength;Decreased balance;Decreased knowledge of precautions;Decreased range of motion;Decreased mobility;Cardiopulmonary status limiting activity;Decreased activity tolerance;Decreased safety awareness       PT Treatment Interventions Functional mobility training;Balance training;DME instruction;Patient/family education;Gait training;Therapeutic activities;Neuromuscular re-education;Stair training;Therapeutic exercise    PT Goals (Current goals can be found in the Care Plan section)  Acute Rehab PT Goals Patient Stated Goal: to return home PT Goal Formulation: With patient Time For Goal Achievement: 06/09/19 Potential to Achieve Goals: Good    Frequency Min 3X/week   Barriers to discharge Decreased caregiver support pt needs assistance with mobility    Co-evaluation               AM-PAC PT "6 Clicks" Mobility  Outcome Measure Help needed turning from your back to your side while in a flat bed without using bedrails?: A Little Help needed moving from lying on your back to sitting on the side of a flat bed without using bedrails?: A Little Help needed moving to and from a bed to a chair (including a wheelchair)?: A Little Help needed standing up from a chair using your arms (e.g., wheelchair or bedside chair)?: A Little Help needed to walk in hospital room?: A Little Help needed climbing 3-5 steps with a railing? : A Lot 6 Click Score: 17    End of Session Equipment Utilized During Treatment: Oxygen Activity Tolerance: Patient limited by fatigue Patient  left: in bed;with bed alarm set;with call bell/phone within reach Nurse Communication: Mobility status PT Visit Diagnosis: Muscle weakness (generalized) (M62.81);Other abnormalities of gait and mobility (R26.89)    Time: 1430-1459 PT Time Calculation (min) (ACUTE ONLY): 29 min   Charges:   PT Evaluation $PT Eval Moderate Complexity: 1 Mod PT Treatments $Therapeutic Activity: 8-22 mins        Lelon Mast 05/26/2019, 3:11 PM

## 2019-05-26 NOTE — Progress Notes (Signed)
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  She has been followed by nephrology as an outpatient and she and her nephrologist discussed about dialysis however she has been refusing dialysis.  It appears that currently she is willing to do that in the ED she was found to be fluid overloaded with pulmonary edema, small bilateral pleural effusion and lower extremity swelling.  She was placed on IV Lasix.  Subjective / 24h Interval events: Appreciates that her breathing has gotten a little bit better along with improvement in her lower extremity swelling  Assessment & Plan: Principal Problem Acute hypoxic respiratory failure in the setting of acute pulmonary edema multifactorial due to acute systolic CHF and progression of chronic kidney disease -appreciate nephrology assistance, she has been placed on high-dose IV Lasix 120 every 8.  Volume status is improving, less urine output over the last 24 hours and creatinine increasing a bit.  Active Problems Acute kidney injury on chronic kidney disease stage IV-nephrology consulted, currently on Lasix challenge.  Creatinine improved but now raising a little bit with decreased urine output, suspect may be on the dry side intravascularly.  Defer to nephrology timing to transition to oral diuretics  Acute systolic CHF-appreciate cardiology follow-up, 2D echo done showed a depressed EF which is new.  She will need ischemic work-up, preferably during his hospital stay with cardiac  catheterization however she does not wish dialysis nor cardiac catheterization.  Will optimize medically, appreciate cardiology input  Anemia of chronic renal disease-continue p.o. iron, hemoglobin overall stable  Dysphagia-intermittent, passed speech evaluation without difficulties  Elevated LFTs-possible congestion from fluid overload, LFTs improving  History of DVT-continue Eliquis  Rectal cancer- in remission  DM2-continue Januvia  Goals of care-difficult to engage patient, I was unsuccessful.  Palliative discussed with her and patient wishes for no dialysis and no cardiac cath.  PT/OT to see to determine discharge avenue  Scheduled Meds: . apixaban  5 mg Oral BID  . aspirin  81 mg Oral Daily  . atorvastatin  10 mg Oral QHS  . Chlorhexidine Gluconate Cloth  6 each Topical Daily  . darbepoetin (ARANESP) injection - NON-DIALYSIS  100 mcg Subcutaneous Q Wed-1800  . gabapentin  100 mg Oral QHS  . iron polysaccharides  150 mg Oral BID  . levothyroxine  50 mcg Oral Daily  . linagliptin  5 mg Oral Daily  . metoprolol succinate  25 mg Oral Daily  . omeprazole  20 mg Oral Daily  . potassium chloride  40 mEq Oral BID  . sodium chloride flush  10-40 mL Intracatheter Q12H   Continuous Infusions: . ferumoxytol 510 mg (05/22/19 1602)  . furosemide 120 mg (05/25/19 2220)   PRN Meds:.acetaminophen **OR** acetaminophen, albuterol, naphazoline-glycerin, ondansetron **OR** ondansetron (ZOFRAN) IV, sodium chloride flush, traMADol  DVT prophylaxis: Eliquis  Code Status: DNR Family Communication: no family at bedside   Status is: Inpatient  Remains inpatient appropriate because:Inpatient level of care appropriate due to severity of illness  Dispo: The patient is from: Home  Anticipated d/c is to: Home              Anticipated d/c date is: 2 days              Patient currently is not medically stable to d/c.  Consultants:  Nephrology Cardiology Palliative  care  Procedures:  None   Microbiology  None   Antimicrobials: None     Objective: Vitals:   05/25/19 1251 05/25/19 1527 05/25/19 2017 05/26/19 0451  BP:  111/69 124/71 118/71  Pulse:  96 (!) 101 (!) 103  Resp:  18 18 17   Temp:  98.4 F (36.9 C) 98.5 F (36.9 C) 99.3 F (37.4 C)  TempSrc:  Oral    SpO2: 97% 96% 93% 98%  Weight:      Height:        Intake/Output Summary (Last 24 hours) at 05/26/2019 8264 Last data filed at 05/26/2019 0500 Gross per 24 hour  Intake 366 ml  Output 1150 ml  Net -784 ml   Filed Weights   05/22/19 1751 05/23/19 0527 05/24/19 0524  Weight: 71.3 kg 69.4 kg 68.6 kg    Examination:  Constitutional: In bed, no apparent distress Eyes: No scleral icterus ENMT: Moist mucous membranes Neck: normal, supple Respiratory: Faint crackles, no wheezing heard Cardiovascular: Regular rate and rhythm, no murmurs, trace edema Abdomen: Soft, nontender, nondistended, positive bowel sounds Musculoskeletal: no clubbing / cyanosis.  Skin: No rashes seen Neurologic: Nonfocal  Data Reviewed: I have independently reviewed following labs and imaging studies   CBC: Recent Labs  Lab 05/21/19 1604 05/21/19 1604 05/22/19 0500 05/23/19 0307 05/24/19 0627 05/25/19 0341 05/26/19 0408  WBC 3.2*   < > 3.1* 2.9* 5.0 9.3 7.1  NEUTROABS 2.4  --   --   --   --   --   --   HGB 7.4*   < > 7.8* 8.1* 7.8* 7.9* 7.7*  HCT 24.1*   < > 25.7* 26.9* 25.7* 26.2* 25.6*  MCV 100.8*   < > 100.0 101.9* 102.0* 101.6* 101.6*  PLT 307   < > 319 324 271 281 246   < > = values in this interval not displayed.   Basic Metabolic Panel: Recent Labs  Lab 05/21/19 1604 05/21/19 2042 05/22/19 0500 05/23/19 0307 05/24/19 0627 05/25/19 0341 05/26/19 0408  NA   < >  --  138 137 136 137 140  K   < >  --  3.8 3.4* 3.0* 2.9* 3.8  CL   < >  --  108 105 104 104 105  CO2   < >  --  19* 22 24 22 26   GLUCOSE   < >  --  167* 184* 182* 204* 213*  BUN   < >  --  67* 64* 53* 47* 50*   CREATININE   < >  --  3.16* 2.81* 2.52* 2.33* 2.59*  CALCIUM   < >  --  8.9 8.7* 8.3* 8.5* 8.6*  MG  --  1.9  --   --   --   --   --    < > = values in this interval not displayed.   Liver Function Tests: Recent Labs  Lab 05/21/19 1604 05/22/19 0500 05/23/19 0307  AST 101* 85* 57*  ALT 187* 169* 136*  ALKPHOS 159* 143* 147*  BILITOT 0.6 0.5 0.6  PROT 6.2* 6.4* 6.1*  ALBUMIN 3.2* 3.3* 3.1*   Coagulation Profile: No results for input(s): INR, PROTIME in the  last 168 hours. HbA1C: No results for input(s): HGBA1C in the last 72 hours. CBG: Recent Labs  Lab 05/21/19 1532  GLUCAP 137*    Recent Results (from the past 240 hour(s))  SARS Coronavirus 2 by RT PCR (hospital order, performed in [facility] hospital lab) Nasopharyngeal Nasopharyngeal Swab     Status: None   Collection Time: 05/21/19  5:33 PM   Specimen: Nasopharyngeal Swab  Result Value Ref Range Status   SARS Coronavirus 2 NEGATIVE NEGATIVE Final    Comment: (NOTE) SARS-CoV-2 target nucleic acids are NOT DETECTED. The SARS-CoV-2 RNA is generally detectable in upper and lower respiratory specimens during the acute phase of infection. The lowest concentration of SARS-CoV-2 viral copies this assay can detect is 250 copies / mL. A negative result does not preclude SARS-CoV-2 infection and should not be used as the sole basis for treatment or other patient management decisions.  A negative result may occur with improper specimen collection / handling, submission of specimen other than nasopharyngeal swab, presence of viral mutation(s) within the areas targeted by this assay, and inadequate number of viral copies (<250 copies / mL). A negative result must be combined with clinical observations, patient history, and epidemiological information. Fact Sheet for Patients:   StrictlyIdeas.no Fact Sheet for Healthcare Providers: BankingDealers.co.za This test is not yet  approved or cleared  by the [place] FDA and has been authorized for detection and/or diagnosis of SARS-CoV-2 by FDA under an Emergency Use Authorization (EUA).  This EUA will remain in effect (meaning this test can be used) for the duration of the COVID-19 declaration under Section 564(b)(1) of the Act, 21 U.S.C. section 360bbb-3(b)(1), unless the authorization is terminated or revoked sooner. Performed at [facility], [place] [address] [place], [place] [address]      Radiology Studies: No results found. Marzetta Board, MD, PhD [facility]  Between 7 am - 7 pm I am available, please contact me via Amion or Securechat  Between 7 pm - 7 am I am not available, please contact night coverage MD/APP via Amion

## 2019-05-26 NOTE — Progress Notes (Addendum)
Pt refused to drink the potassium supplement.  She did not like the taste.  Possible to take PO pills dissolved in applesauce? As she takes other smaller pills whole with a bite of applesauce   Of note pt did not urinate after getting up to [facility] @ 2220 5/15.  Pt complains of generally not feeling well but when probed she does not specify.  Pt also a bit more lethargic this shift versus last shift.  Questionable quality of sleep as she had some trouble getting comfortable during the night versus disease process.

## 2019-05-26 NOTE — Progress Notes (Addendum)
Progress Note  Patient Name: Nicole Sellers Date of Encounter: 05/26/2019  Primary Cardiologist: Quay Burow, MD   Subjective   Denies dyspnea or chest pain.  Net negative on 780 cc on IV Lasix 120 mg 3 times daily yesterday.  Worsening creatinine today (2.3 > 2.6).  BP stable at 118/71.   Inpatient Medications    Scheduled Meds: . apixaban  5 mg Oral BID  . aspirin  81 mg Oral Daily  . atorvastatin  10 mg Oral QHS  . Chlorhexidine Gluconate Cloth  6 each Topical Daily  . darbepoetin (ARANESP) injection - NON-DIALYSIS  100 mcg Subcutaneous Q Wed-1800  . gabapentin  100 mg Oral QHS  . iron polysaccharides  150 mg Oral BID  . levothyroxine  50 mcg Oral Daily  . linagliptin  5 mg Oral Daily  . metoprolol succinate  12.5 mg Oral Daily  . omeprazole  20 mg Oral Daily  . potassium chloride  40 mEq Oral BID  . sodium chloride flush  10-40 mL Intracatheter Q12H   Continuous Infusions: . ferumoxytol 510 mg (05/22/19 1602)  . furosemide 120 mg (05/25/19 2220)   PRN Meds: acetaminophen **OR** acetaminophen, albuterol, naphazoline-glycerin, ondansetron **OR** ondansetron (ZOFRAN) IV, sodium chloride flush, traMADol   Vital Signs    Vitals:   05/25/19 1251 05/25/19 1527 05/25/19 2017 05/26/19 0451  BP:  111/69 124/71 118/71  Pulse:  96 (!) 101 (!) 103  Resp:  18 18 17   Temp:  98.4 F (36.9 C) 98.5 F (36.9 C) 99.3 F (37.4 C)  TempSrc:  Oral    SpO2: 97% 96% 93% 98%  Weight:      Height:        Intake/Output Summary (Last 24 hours) at 05/26/2019 0628 Last data filed at 05/26/2019 0500 Gross per 24 hour  Intake 366 ml  Output 1150 ml  Net -784 ml   Filed Weights   05/22/19 1751 05/23/19 0527 05/24/19 0524  Weight: 71.3 kg 69.4 kg 68.6 kg    Telemetry    Sinus rhythm rate 90-100s, PVCs  - Personally Reviewed  ECG    No new tracings - Personally Reviewed  Physical Exam   GEN: Lying in bed in no acute distress.   Neck: + JVD Cardiac: RRR, no murmurs,  rubs, or gallops.  Respiratory: decreased breath sounds at lung bases  GI: NABS, Soft, nontender, non-distended  MS: No edema, no deformity. Neuro:  Nonfocal, moving all extremities spontaneously Psych: Normal affect   Labs    Chemistry Recent Labs  Lab 05/21/19 1604 05/21/19 1604 05/22/19 0500 05/22/19 0500 05/23/19 0307 05/23/19 0307 05/24/19 0627 05/25/19 0341 05/26/19 0408  NA 136   < > 138   < > 137   < > 136 137 140  K 4.1   < > 3.8   < > 3.4*   < > 3.0* 2.9* 3.8  CL 107   < > 108   < > 105   < > 104 104 105  CO2 17*   < > 19*   < > 22   < > 24 22 26   GLUCOSE 153*   < > 167*   < > 184*   < > 182* 204* 213*  BUN 73*   < > 67*   < > 64*   < > 53* 47* 50*  CREATININE 3.29*   < > 3.16*   < > 2.81*   < > 2.52* 2.33* 2.59*  CALCIUM 8.8*   < >  8.9   < > 8.7*   < > 8.3* 8.5* 8.6*  PROT 6.2*  --  6.4*  --  6.1*  --   --   --   --   ALBUMIN 3.2*  --  3.3*  --  3.1*  --   --   --   --   AST 101*  --  85*  --  57*  --   --   --   --   ALT 187*  --  169*  --  136*  --   --   --   --   ALKPHOS 159*  --  143*  --  147*  --   --   --   --   BILITOT 0.6  --  0.5  --  0.6  --   --   --   --   GFRNONAA 13*   < > 14*   < > 16*   < > 19* 20* 18*  GFRAA 16*   < > 16*   < > 19*   < > 21* 24* 21*  ANIONGAP 12   < > 11   < > 10   < > 8 11 9    < > = values in this interval not displayed.     Hematology Recent Labs  Lab 05/24/19 0627 05/25/19 0341 05/26/19 0408  WBC 5.0 9.3 7.1  RBC 2.52* 2.58* 2.52*  HGB 7.8* 7.9* 7.7*  HCT 25.7* 26.2* 25.6*  MCV 102.0* 101.6* 101.6*  MCH 31.0 30.6 30.6  MCHC 30.4 30.2 30.1  RDW 18.6* 19.2* 18.6*  PLT 271 281 246    Cardiac EnzymesNo results for input(s): TROPONINI in the last 168 hours. No results for input(s): TROPIPOC in the last 168 hours.   BNP Recent Labs  Lab 05/22/19 1633  BNP >4,500.0*     DDimer No results for input(s): DDIMER in the last 168 hours.   Radiology    No results found.  Cardiac Studies   Lexiscan Myoview  06/14/2018:  Nuclear stress EF: 56%.  The left ventricular ejection fraction is normal (55-65%).  There was no ST segment deviation noted during stress.  No T wave inversion was noted during stress.  Defect 1: There is a small defect of mild severity present in the basal anterior location.  The study is normal.  This is a low risk study.  Low risk stress nuclear study with normal perfusion and normal left ventricular regional and global systolic function. _______________  Echocardiogram 05/22/2019: Impressions: 1. RVEF is moderately decreased.  2. Since the last study on 03/31/2019 there has been a significant change,  LVEF has decreased from 50-55% (with akinesis in the basal and mid  anteroseptal and anterior walls) to 15-20% with severe diffuse hypokinesis  and paradoxical septal motion.  3. There is no thrombus on the left ventricle (on Definity echo contrast  imaging).  4. Left ventricular ejection fraction, by estimation, is 20 to 25%. The  left ventricle has severely decreased function. The left ventricle  demonstrates global hypokinesis. The left ventricular internal cavity size  was moderately dilated. Left  ventricular diastolic parameters are consistent with Grade II diastolic  dysfunction (pseudonormalization). Elevated left atrial pressure. The  average left ventricular global longitudinal strain is -7.0 %.  5. Right ventricular systolic function is moderately reduced. The right  ventricular size is moderately enlarged. There is mildly elevated  pulmonary artery systolic pressure. The estimated right ventricular  systolic pressure is  42.2 mmHg.  6. Left atrial size was moderately dilated.  7. Right atrial size was mildly dilated.  8. The mitral valve is normal in structure. Moderate mitral valve  regurgitation. No evidence of mitral stenosis.  9. Tricuspid valve regurgitation is moderate.  10. The aortic valve is normal in structure. Aortic valve  regurgitation is  mild. Mild to moderate aortic valve sclerosis/calcification is present,  without any evidence of aortic stenosis.  11. The inferior vena cava is normal in size with greater than 50%  respiratory variability, suggesting right atrial pressure of 3 mmHg.   Patient Profile     69 y.o. 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   Assessment & Plan    1. Acute combined CHF: patient presented with DOE, orthopnea, and edema. CXR/CT chest c/w CHF. BNP >4500. Echo revealed EF 15-20%, down from 50-55% 03/2019, with G2DD, moderate MR/TR, and no LV thrombus. EKG this admission revealed LBBB which may have been present since September.  Suspect ischemic cardiomyopathy given her PAD history. Unfortunately CKD stage 4-5 has limited evaluation (peak creatinine 3.3 on admission), as patient is not interested in the possibility of dialysis at this time.  She does not want heart catheterization.  However she was started on IV Lasix 120 mg 3 times daily per nephrology, with improvement in renal function - Continue diuresis per nephrology - Continue metoprolol succinate, will increase to 25 mg daily - No ACE/ARB/ARNI given renal function - Patient declines LHC/RHC to work-up her heart failure - Continue to monitor strict I&Os, daily weights, renal function, and electrolytes closely - Continue aspirin and statin for presumed CAD  2. PAD: s/p PTA/stent to the right SFA in 2014 due to ischemic toe  and ultimately required 2nd toe amputation. She then underwent PTA/stenting to right external iliac artery in 04/2017. Most recent lower extremity dopplers in 03/2019 showed patent right SFA stent and 30-40% stenosis of left common femoral artery as well as occlusion of the right peroneal artery, left SFA with reconstition in the distal segment, and left peroneal artery. Bilateral ABIs and TBIs decreased from 2019 studies. - Continue aspirin and statin - Continue outpatient follow-up with Dr. Gwenlyn Found  3. Carotid artery stenosis: mild right ICA and moderate left ICA on doppler 06/2018. Due for follow-up dopplers 06/2019 - Continue aspirin and statin - Continue routine outpatient monitoring  4. HTN: BP stable  - Continue metoprolol succinate  5. HLD: LDL 53, at goal <70 - Continue atorvastatin   6. DVT: diagnosed 11/2018 but anticoagulation not initiated due to loss of follow-up. Subsequently started on eliquis 03/2019 - Continue Eliquis.  No procedures planned, as patient declines heart catheterization  7. DM type 2: A1C 5.4 03/2019; at goal of <7 - Continue management per primary team.  8. CKD stage 4-5: Cr improved from 3.29 on admission to 2.6 today with IV diuresis. Nephrology following. Patient is not interested in dialysis.  - Continue to monitor closely  9. Anemia of chronic disease/iron deficiency: Hgb  trough 6.6. Nephrology started on feraheme and aranesp. Hgb stable at 7.7 today - Continue management per primary team and nephrology  10. Transaminitis: likely 2/2 hepatic congestion. LFTs trending down.  - Continue to monitor.   For questions or updates, please contact [place] Please consult [email or web address] for contact info under Cardiology/STEMI.      Signed, Donato Heinz, MD  05/26/2019, 6:28 AM   [phone]

## 2019-05-27 LAB — CBC
HCT: 26.6 % — ABNORMAL LOW (ref 36.0–46.0)
Hemoglobin: 8.1 g/dL — ABNORMAL LOW (ref 12.0–15.0)
MCH: 31.2 pg (ref 26.0–34.0)
MCHC: 30.5 g/dL (ref 30.0–36.0)
MCV: 102.3 fL — ABNORMAL HIGH (ref 80.0–100.0)
Platelets: 242 10*3/uL (ref 150–400)
RBC: 2.6 MIL/uL — ABNORMAL LOW (ref 3.87–5.11)
RDW: 18.7 % — ABNORMAL HIGH (ref 11.5–15.5)
WBC: 5.6 10*3/uL (ref 4.0–10.5)
nRBC: 0.5 % — ABNORMAL HIGH (ref 0.0–0.2)

## 2019-05-27 LAB — BASIC METABOLIC PANEL
Anion gap: 9 (ref 5–15)
BUN: 52 mg/dL — ABNORMAL HIGH (ref 8–23)
CO2: 25 mmol/L (ref 22–32)
Calcium: 8.9 mg/dL (ref 8.9–10.3)
Chloride: 104 mmol/L (ref 98–111)
Creatinine, Ser: 2.83 mg/dL — ABNORMAL HIGH (ref 0.44–1.00)
GFR calc Af Amer: 19 mL/min — ABNORMAL LOW (ref >60)
GFR calc non Af Amer: 16 mL/min — ABNORMAL LOW (ref >60)
Glucose, Bld: 213 mg/dL — ABNORMAL HIGH (ref 70–99)
Potassium: 4.1 mmol/L (ref 3.5–5.1)
Sodium: 138 mmol/L (ref 135–145)

## 2019-05-27 MED ORDER — FENTANYL CITRATE (PF) 100 MCG/2ML IJ SOLN
12.5000 ug | Freq: Once | INTRAMUSCULAR | Status: DC
  Filled 2019-05-27: qty 2

## 2019-05-27 MED ORDER — DOXYCYCLINE HYCLATE 100 MG PO TABS: 100.0000 mg | ORAL_TABLET | Freq: Two times a day (BID) | ORAL | Status: DC

## 2019-05-27 MED ORDER — AMOXICILLIN-POT CLAVULANATE 875-125 MG PO TABS
1.0000 | ORAL_TABLET | Freq: Two times a day (BID) | ORAL | Status: DC
  Administered 2019-05-27 – 2019-05-28 (×4): 1 via ORAL
  Filled 2019-05-27 (×4): qty 1

## 2019-05-27 NOTE — Progress Notes (Signed)
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  She has been followed by nephrology as an outpatient and she and her nephrologist discussed about dialysis however she has been refusing dialysis.  It appears that currently she is willing to do that in the ED she was found to be fluid overloaded with pulmonary edema, small bilateral pleural effusion and lower extremity swelling.  She was placed on IV Lasix.  Subjective / 24h Interval events: She feels about the same as she was yesterday.  Appreciates that her swelling is gone down significantly  Assessment & Plan: Principal Problem Acute hypoxic respiratory failure in the setting of acute pulmonary edema multifactorial due to acute systolic CHF and progression of chronic kidney disease -appreciate nephrology assistance, she has been placed on high-dose IV Lasix 120 every 8.  She is dry at this point and was converted to torsemide.  She is approaching end-stage renal, has minimal urine output and her creatinine has climbed however she does not wish dialysis.  She will be discharged home with hospice once hospice services are set up.  Active Problems Acute kidney injury on chronic kidney disease stage IV-nephrology consulted, received Lasix challenge with improvement in her volume status.  Now on oral diuretics  Acute systolic CHF-appreciate cardiology follow-up, 2D echo done showed a depressed EF which is new.  Ischemic work-up was recommended however patient does not want  dialysis nor catheterization.  Optimize medical regimen on discharge  Anemia of chronic renal disease-continue p.o. iron, hemoglobin overall stable  Dysphagia-intermittent, passed speech evaluation without difficulties  Elevated LFTs-possible congestion from fluid overload, LFTs improving  History of DVT-continue Eliquis  Rectal cancer- in remission  DM2-continue Januvia  Goals of care-home with hospice once services set up  Left wrist abscess-small raised abscess on the left wrist where the IV was, starting short course of doxycycline to prevent it from spreading  Scheduled Meds: . amoxicillin-clavulanate  1 tablet Oral Q12H  . apixaban  5 mg Oral BID  . aspirin  81 mg Oral Daily  . atorvastatin  10 mg Oral QHS  . Chlorhexidine Gluconate Cloth  6 each Topical Daily  . darbepoetin (ARANESP) injection - NON-DIALYSIS  100 mcg Subcutaneous Q Wed-1800  . gabapentin  100 mg Oral QHS  . iron polysaccharides  150 mg Oral BID  . levothyroxine  50 mcg Oral Daily  . linagliptin  5 mg Oral Daily  . metoprolol succinate  25 mg Oral Daily  . omeprazole  20 mg Oral Daily  . potassium chloride  40 mEq Oral BID  . sodium chloride flush  10-40 mL Intracatheter Q12H  . torsemide  60 mg Oral BID   Continuous Infusions: . ferumoxytol 510 mg (05/22/19 1602)   PRN Meds:.acetaminophen **OR** acetaminophen, albuterol, naphazoline-glycerin, ondansetron **OR** ondansetron (ZOFRAN) IV, sodium chloride flush, traMADol  DVT prophylaxis: Eliquis  Code Status: DNR Family Communication: no family at bedside   Status is: Inpatient  Remains inpatient appropriate because:Inpatient level of care appropriate due to severity of illness  Dispo: The patient is from: Home  Anticipated d/c is to: Home              Anticipated d/c date is: 1 day              Patient currently is not medically stable to d/c.  Consultants:  Nephrology Cardiology Palliative care  Procedures:  None    Microbiology  None   Antimicrobials: None     Objective: Vitals:   05/26/19 2025 05/27/19 0500 05/27/19 0928 05/27/19 1404  BP: 120/72 110/70 121/77 114/65  Pulse: 87 80 90 90  Resp: 19 17 20 20   Temp: 97.9 F (36.6 C) 98.1 F (36.7 C)  99.2 F (37.3 C)  TempSrc: Oral Oral  Oral  SpO2: 100% 100% 100% 99%  Weight:  69.3 kg    Height:        Intake/Output Summary (Last 24 hours) at 05/27/2019 1428 Last data filed at 05/27/2019 1100 Gross per 24 hour  Intake 324 ml  Output 975 ml  Net -651 ml   Filed Weights   05/23/19 0527 05/24/19 0524 05/27/19 0500  Weight: 69.4 kg 68.6 kg 69.3 kg    Examination:  Constitutional: No distress, appears weak Eyes: No icterus ENMT: mmm Neck: normal, supple Respiratory: No wheezing, diminished at the bases Cardiovascular: Regular rate and rhythm, no murmurs, trace edema Abdomen: Soft, NT, ND, positive bowel sounds Musculoskeletal: no clubbing / cyanosis.  Skin: No rashes seen Neurologic: No focal deficits  Data Reviewed: I have independently reviewed following labs and imaging studies   CBC: Recent Labs  Lab 05/21/19 1604 05/22/19 0500 05/23/19 0307 05/24/19 0627 05/25/19 0341 05/26/19 0408 05/27/19 0359  WBC 3.2*   < > 2.9* 5.0 9.3 7.1 5.6  NEUTROABS 2.4  --   --   --   --   --   --   HGB 7.4*   < > 8.1* 7.8* 7.9* 7.7* 8.1*  HCT 24.1*   < > 26.9* 25.7* 26.2* 25.6* 26.6*  MCV 100.8*   < > 101.9* 102.0* 101.6* 101.6* 102.3*  PLT 307   < > 324 271 281 246 242   < > = values in this interval not displayed.   Basic Metabolic Panel: Recent Labs  Lab 05/21/19 2042 05/22/19 0500 05/23/19 0307 05/24/19 0627 05/25/19 0341 05/26/19 0408 05/27/19 0359  NA  --    < > 137 136 137 140 138  K  --    < > 3.4* 3.0* 2.9* 3.8 4.1  CL  --    < > 105 104 104 105 104  CO2  --    < > 22 24 22 26 25   GLUCOSE  --    < > 184* 182* 204* 213* 213*  BUN  --    < > 64* 53* 47* 50* 52*  CREATININE  --    < > 2.81* 2.52* 2.33* 2.59*  2.83*  CALCIUM  --    < > 8.7* 8.3* 8.5* 8.6* 8.9  MG 1.9  --   --   --   --   --   --    < > = values in this interval not displayed.   Liver Function Tests: Recent Labs  Lab 05/21/19 1604 05/22/19 0500 05/23/19 0307  AST 101* 85* 57*  ALT 187* 169* 136*  ALKPHOS 159* 143* 147*  BILITOT 0.6 0.5 0.6  PROT 6.2* 6.4* 6.1*  ALBUMIN 3.2* 3.3* 3.1*   Coagulation Profile: No results for input(s): INR, PROTIME in the last 168  hours. HbA1C: No results for input(s): HGBA1C in the last 72 hours. CBG: Recent Labs  Lab 05/21/19 1532  GLUCAP 137*    Recent Results (from the past 240 hour(s))  SARS Coronavirus 2 by RT PCR (hospital order, performed in [facility] hospital lab) Nasopharyngeal Nasopharyngeal Swab     Status: None   Collection Time: 05/21/19  5:33 PM   Specimen: Nasopharyngeal Swab  Result Value Ref Range Status   SARS Coronavirus 2 NEGATIVE NEGATIVE Final    Comment: (NOTE) SARS-CoV-2 target nucleic acids are NOT DETECTED. The SARS-CoV-2 RNA is generally detectable in upper and lower respiratory specimens during the acute phase of infection. The lowest concentration of SARS-CoV-2 viral copies this assay can detect is 250 copies / mL. A negative result does not preclude SARS-CoV-2 infection and should not be used as the sole basis for treatment or other patient management decisions.  A negative result may occur with improper specimen collection / handling, submission of specimen other than nasopharyngeal swab, presence of viral mutation(s) within the areas targeted by this assay, and inadequate number of viral copies (<250 copies / mL). A negative result must be combined with clinical observations, patient history, and epidemiological information. Fact Sheet for Patients:   StrictlyIdeas.no Fact Sheet for Healthcare Providers: BankingDealers.co.za This test is not yet approved or cleared  by the [place] FDA  and has been authorized for detection and/or diagnosis of SARS-CoV-2 by FDA under an Emergency Use Authorization (EUA).  This EUA will remain in effect (meaning this test can be used) for the duration of the COVID-19 declaration under Section 564(b)(1) of the Act, 21 U.S.C. section 360bbb-3(b)(1), unless the authorization is terminated or revoked sooner. Performed at [facility], [place] [address] [place], [place] [address]      Radiology Studies: No results found. Marzetta Board, MD, PhD [facility]  Between 7 am - 7 pm I am available, please contact me via Amion or Securechat  Between 7 pm - 7 am I am not available, please contact night coverage MD/APP via Amion

## 2019-05-27 NOTE — Progress Notes (Signed)
Hydrologist [facility]) Hospital Liaison: RN note     Notified by Transition of [place], RN of patient/family request for [facility] services at home after discharge. Chart and patient information under review by [facility] physician. Hospice eligibility pending currently.     Writer spoke with son, [name] to initiate education related to hospice philosophy, services and team approach to care. [name] verbalized understanding of information given. Per discussion, plan is for discharge to home by private vehicle .   Please send signed and completed DNR form home with patient/family. Patient will need prescriptions for discharge comfort medications.      DME needs have been discussed, patient currently has the following equipment in the home:       .  Patient/family requests the following DME for delivery to the home: walker, 3N1 and oxygen with a tank to deliver to room for DC. [place] equipment manager has been notified and will contact DME provider to arrange delivery to the home. Home address has been verified and is correct in the chart.  [name] is the family member to contact to arrange time of delivery.      [facility] aware of the above. Please notify [facility] when patient is ready to leave the unit at discharge. (Call [phone] or [phone] after 5pm.) [facility] information and contact numbers given to [facility].      Please call with any hospice related questions.      Thank you for this referral.      Farrel Gordon, RN, [facility] (listed on [place] under [place])   [phone]

## 2019-05-27 NOTE — TOC Progression Note (Signed)
Transition of Care [facility]) - Progression Note    Patient Details  Name: Nicole Sellers MRN: [id number] Date of Birth: [birth year]  Transition of Care [facility]) CM/SW Contact  Rafaela Dinius, Juliann Pulse, RN Phone Number: 05/27/2019, 3:39 PM  Clinical Narrative: Patient/son chose [facility] home w/hospice-rep Bevely Palmer aware of acceptance-home 02 needed-[facility] care will make arrangements for delivery-patient can safely d/c in am. DNR on shadow chart. Family able to transport home.      Expected Discharge Plan: Home w Hospice Care Barriers to Discharge: Continued Medical Work up  Expected Discharge Plan and Services Expected Discharge Plan: [place]   Discharge Planning Services: CM Consult   Living arrangements for the past 2 months: Single Family Home                           HH Arranged: RN [facility] Agency: Hospice and [place] Date [place]: 05/27/19 Time [place]: [place] Representative spoke with at [place]: [place] Determinants of Health (SDOH) Interventions    Readmission Risk Interventions Readmission Risk Prevention Plan 05/22/2019 05/22/2019 10/02/2018  Transportation Screening - Complete Complete  Medication Review Press photographer) - Complete Complete  PCP or Specialist appointment within 3-5 days of discharge - Complete Not Complete  PCP/Specialist Appt Not Complete comments - - not ready to dc  HRI or [place] - Complete Complete  SW Recovery Care/Counseling Consult - Complete Complete  SW Consult Not Complete Comments - - -  Palliative Care Screening Complete Not Applicable Not Applicable  Skilled Nursing Facility - Not Applicable Not Applicable  Some recent data might be hidden

## 2019-05-27 NOTE — TOC Progression Note (Signed)
Transition of Care [facility]) - Progression Note    Patient Details  Name: Nicole Sellers MRN: [id number] Date of Birth: 04/08/1950  Transition of Care [facility]) CM/SW Contact  Drucilla Cumber, Juliann Pulse, RN Phone Number: 05/27/2019, 12:06 PM  Clinical Narrative:  CM referral to offer choice home w/hospice  Vs residential hospice-await call back from Son Christopher-per patient [place] hospice list in rm-await choice.     Expected Discharge Plan: Home w Hospice Care Barriers to Discharge: Continued Medical Work up  Expected Discharge Plan and Services Expected Discharge Plan: [place]   Discharge Planning Services: CM Consult   Living arrangements for the past 2 months: Single Family Home                                       Social Determinants of Health (SDOH) Interventions    Readmission Risk Interventions Readmission Risk Prevention Plan 05/22/2019 05/22/2019 10/02/2018  Transportation Screening - Complete Complete  Medication Review Press photographer) - Complete Complete  PCP or Specialist appointment within 3-5 days of discharge - Complete Not Complete  PCP/Specialist Appt Not Complete comments - - not ready to dc  HRI or [place] - Complete Complete  SW Recovery Care/Counseling Consult - Complete Complete  SW Consult Not Complete Comments - - -  Palliative Care Screening Complete Not Applicable Not Applicable  Skilled Nursing Facility - Not Applicable Not Applicable  Some recent data might be hidden

## 2019-05-27 NOTE — Progress Notes (Signed)
Daily Progress Note   Patient Name: Nicole Sellers       Date: 05/27/2019 DOB: Jun 03, 1950  Age: 69 y.o. MRN#: [id number] Attending Physician: Caren Griffins, MD Primary Care Physician: Reynold Bowen, MD Admit Date: 05/21/2019  Reason for Consultation/Follow-up: Establishing goals of care  Subjective: [name] appears weak, denies pain. States that she spoke with [facility] staff about hospice arrangements.      Length of Stay: 6  Current Medications: Scheduled Meds:  . amoxicillin-clavulanate  1 tablet Oral Q12H  . apixaban  5 mg Oral BID  . aspirin  81 mg Oral Daily  . atorvastatin  10 mg Oral QHS  . Chlorhexidine Gluconate Cloth  6 each Topical Daily  . darbepoetin (ARANESP) injection - NON-DIALYSIS  100 mcg Subcutaneous Q Wed-1800  . gabapentin  100 mg Oral QHS  . iron polysaccharides  150 mg Oral BID  . levothyroxine  50 mcg Oral Daily  . linagliptin  5 mg Oral Daily  . metoprolol succinate  25 mg Oral Daily  . omeprazole  20 mg Oral Daily  . potassium chloride  40 mEq Oral BID  . sodium chloride flush  10-40 mL Intracatheter Q12H  . torsemide  60 mg Oral BID    Continuous Infusions: . ferumoxytol 510 mg (05/22/19 1602)    PRN Meds: acetaminophen **OR** acetaminophen, albuterol, naphazoline-glycerin, ondansetron **OR** ondansetron (ZOFRAN) IV, sodium chloride flush, traMADol  Physical Exam         Lying in bed, no acute distress, appears chronically ill Decreased breath sounds at bases S1-S2 Abdomen is not distended Trace edema Awake alert nonfocal  Vital Signs: BP 121/77 (BP Location: Right Arm)   Pulse 90   Temp 98.1 F (36.7 C) (Oral)   Resp 20   Ht 5\' 6"  (1.676 m)   Wt 69.3 kg   SpO2 100%   BMI 24.66 kg/m  SpO2: SpO2: 100 % O2 Device: O2 Device: Nasal  Cannula O2 Flow Rate: O2 Flow Rate (L/min): 1 L/min  Intake/output summary:   Intake/Output Summary (Last 24 hours) at 05/27/2019 1404 Last data filed at 05/27/2019 1100 Gross per 24 hour  Intake 324 ml  Output 1075 ml  Net -751 ml   LBM: Last BM Date: 05/27/19 Baseline Weight: Weight: 71.3 kg Most recent weight: Weight: 69.3 kg  Palliative Assessment/Data:      Patient Active Problem List   Diagnosis Date Noted  . Palliative care by specialist   . Goals of care, counseling/discussion   . General weakness   . CKD (chronic kidney disease) stage 4, GFR 15-29 ml/min (HCC) 05/21/2019  . Fluid overload 05/21/2019  . New onset of congestive heart failure ([place]) 05/21/2019  . Anemia of chronic disease 05/21/2019  . Transaminitis 05/21/2019  . Acute kidney injury ([place]) 03/29/2019  . DVT (deep venous thrombosis) ([place]) 03/29/2019  . Oral candidiasis   . AKI (acute kidney injury) ([place]) 10/01/2018  . Hypokalemia 10/01/2018  . Pyuria 10/01/2018  . Odynophagia 10/01/2018  . Hyponatremia 10/01/2018  . Right sided weakness 10/01/2018  . SBO (small bowel obstruction) ([place]) 08/22/2018  . Dehydration 08/21/2018  . Pre-operative cardiovascular examination 06/07/2018  . Rectal cancer ([place]) 11/06/2017  . Claudication in peripheral vascular disease ([place]) 05/08/2017  . Carotid artery disease ([place]) 04/14/2017  . PAD (peripheral artery disease) Rt ABI 0.7, Lt ABI 0.46 12/25/2012  . S/P arterial stent, 12/25/13, successful diamondback orbital rotational arthrectomy, PTA using chocolate  balloon and stenting using I DEV stent of long segment calcified high-grade proximal and mid r 12/25/2012  . Gangrene of toe, Rt second toe 12/25/2012  . Essential hypertension 12/13/2012  . Non-insulin treated type 2 diabetes mellitus ([place]) 12/13/2012  . Critical lower limb ischemia 12/13/2012  . Tobacco abuse 12/13/2012    Palliative Care Assessment & Plan   Patient Profile:    Assessment:  69  y.o. female  with past medical history of   PAD s/p stenting, carotid artery disease, DVT, DM, HTN, CKD IV-v, history of rectal cancer admitted on 05/21/2019 with acute combined CHF .     Patient remains admitted to hospital medicine service for acute hypoxic resp failure, acute pulmonary edema, acute systolic CHF and for progression of her CKD. Cardiology and renal medicine colleagues are also following.   Palliative consultation for goals of care.  Recommendations/Plan: Patient wants to go home with hospice, she doesn't want residential hospice. Discussed with [facility] colleague Juliann Pulse.    Code Status:    Code Status Orders  (From admission, onward)         Start     Ordered   05/21/19 2044  Do not attempt resuscitation (DNR)  Continuous    Question Answer Comment  In the event of cardiac or respiratory ARREST Do not call a "code blue"   In the event of cardiac or respiratory ARREST Do not perform Intubation, CPR, defibrillation or ACLS   In the event of cardiac or respiratory ARREST Use medication by any route, position, wound care, and other measures to relive pain and suffering. May use oxygen, suction and manual treatment of airway obstruction as needed for comfort.      05/21/19 2045        Code Status History    Date Active Date Inactive Code Status Order ID Comments User Context   04/02/2019 1149 04/04/2019 2359 DNR [id number]  Jimmy Footman, NP Inpatient   03/29/2019 2317 04/02/2019 1149 Full Code [id number]  Etta Quill, DO ED   10/02/2018 0022 10/06/2018 2252 Full Code [id number]  Toy Baker, MD Inpatient   08/21/2018 1909 09/02/2018 1541 Full Code [id number]  Leighton Ruff, MD Inpatient   08/21/2018 1352 08/21/2018 1753 Full Code [id number]  Leighton Ruff, MD Outpatient   08/03/2018 1609 08/09/2018 2130 Full Code [id number]  Leighton Ruff, MD Inpatient   05/08/2017 1008  05/09/2017 1501 Full Code [id number]  Lorretta Harp, MD Inpatient   Advance Care Planning  Activity    Advance Directive Documentation     Most Recent Value  Type of Advance Directive  Out of facility DNR (pink MOST or yellow form) [pt states she left that at home]  Pre-existing out of facility DNR order (yellow form or pink MOST form)  --  "MOST" Form in Place?  --      Prognosis:  ?2-3 months.  Guarded.  Decline could be accelerated if patient has ongoing worsening of her renal function, her cardiac condition etc. She is aware.   Discharge Planning: Home with hospice, as per her wishes.   Care plan was discussed with  Patient   Thank you for allowing the Palliative Medicine Team to assist in the care of this patient.   Time In: 1330 Time Out: 1405 Total Time 35 Prolonged Time Billed  no       Greater than 50%  of this time was spent counseling and coordinating care related to the above assessment and plan.  Loistine Chance, MD  Please contact Palliative Medicine Team phone at [phone] for questions and concerns.

## 2019-05-27 NOTE — Evaluation (Signed)
Occupational Therapy Evaluation Patient Details Name: Nicole Sellers MRN: [id number] DOB: [birth year] Today's Date: 05/27/2019    History of Present Illness Pt is a 69yo female with history of stage III rectal cancer status post chemo, radiation, resection, no residual tumor and currently in remission, with history of ileostomy on 2020, chronic kidney disease stage IV, hypertension, DM 2, hypothyroidism, PAD, 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   Clinical Impression   Pt admitted with the above. Pt currently with functional limitations due to the deficits listed below (see OT Problem List).  Pt will benefit from skilled OT to increase their safety and independence with ADL and functional mobility for ADL to facilitate discharge to venue listed below.   Pt very pleasant but did decline getting to chair.  Pt stated she just wasn't feeling up to it     Follow Up Recommendations  Supervision/Assistance - 24 hour;SNF;Home health OT(HH if 24/7 A)          Precautions / Restrictions Precautions Precautions: Fall      Mobility Bed Mobility Overal bed mobility: Needs Assistance Bed Mobility: Supine to Sit;Sit to Supine     Supine to sit: Min assist;HOB elevated Sit to supine: Min assist   General bed mobility comments: cues for technique, trunk support  Transfers Overall transfer level: Needs assistance Equipment used: Rolling walker (2 wheeled) Transfers: Sit to/from Stand Sit to Stand: Min assist;From elevated surface              Balance Overall balance assessment: Needs assistance Sitting-balance support: Bilateral upper extremity supported;Feet supported Sitting balance-Leahy Scale: Good     Standing balance support: Bilateral upper extremity supported Standing balance-Leahy Scale: Poor                             ADL either performed or assessed with clinical judgement    ADL Overall ADL's : Needs assistance/impaired Eating/Feeding: Set up;Sitting   Grooming: Set up;Sitting   Upper Body Bathing: Minimal assistance;Sitting   Lower Body Bathing: Maximal assistance;Sit to/from stand;Cueing for safety;Cueing for compensatory techniques;Cueing for sequencing   Upper Body Dressing : Minimal assistance;Sitting   Lower Body Dressing: Maximal assistance;Sit to/from stand;Cueing for safety   Toilet Transfer: Moderate assistance;Cueing for sequencing;Stand-pivot   Toileting- Clothing Manipulation and Hygiene: Moderate assistance       Functional mobility during ADLs: Minimal assistance;Rolling walker                    Pertinent Vitals/Pain Faces Pain Scale: No hurt     Hand Dominance     Extremity/Trunk Assessment Upper Extremity Assessment Upper Extremity Assessment: Generalized weakness           Communication Communication Communication: No difficulties   Cognition Arousal/Alertness: Awake/alert Behavior During Therapy: WFL for tasks assessed/performed Overall Cognitive Status: Within Functional Limits for tasks assessed                                                Home Living Family/patient expects to be discharged to:: Private residence   Available Help at Discharge: Family;Available PRN/intermittently Type of Home: Apartment Home Access: Level entry     Home Layout: One level  Home Equipment: Shower seat;Cane - single point;Walker - 2 wheels   Additional Comments: pt lives on 4th floor of senior apartment residence, there is an Media planner      Prior Functioning/Environment Level of Independence: Independent with assistive device(s)                 OT Problem List: Decreased strength;Decreased activity tolerance;Impaired balance (sitting and/or standing);Decreased knowledge of use of DME or AE;Decreased safety awareness      OT Treatment/Interventions: Self-care/ADL  training;Patient/family education;DME and/or AE instruction    OT Goals(Current goals can be found in the care plan section) Acute Rehab OT Goals Patient Stated Goal: to return home OT Goal Formulation: With patient Time For Goal Achievement: 06/03/19 ADL Goals Pt Will Perform Grooming: with supervision;standing Pt Will Perform Lower Body Bathing: with min guard assist;sit to/from stand Pt Will Perform Lower Body Dressing: with min guard assist;sit to/from stand Pt Will Transfer to Toilet: with min assist Pt Will Perform Toileting - Clothing Manipulation and hygiene: with min guard assist;sit to/from stand  OT Frequency: Min 2X/week   Barriers to D/C: Decreased caregiver support          Co-evaluation              AM-PAC OT "6 Clicks" Daily Activity     Outcome Measure Help from another person eating meals?: A Little Help from another person taking care of personal grooming?: A Little Help from another person toileting, which includes using toliet, bedpan, or urinal?: A Little Help from another person bathing (including washing, rinsing, drying)?: A Lot Help from another person to put on and taking off regular upper body clothing?: A Little Help from another person to put on and taking off regular lower body clothing?: A Lot 6 Click Score: 16   End of Session Equipment Utilized During Treatment: Rolling walker Nurse Communication: Mobility status  Activity Tolerance: Patient tolerated treatment well Patient left: in bed;with call bell/phone within reach;with bed alarm set  OT Visit Diagnosis: Unsteadiness on feet (R26.81);Other abnormalities of gait and mobility (R26.89);Muscle weakness (generalized) (M62.81);Repeated falls (R29.6);History of falling (Z91.81)                Time: 0623-7628 OT Time Calculation (min): 14 min Charges:  OT General Charges $OT Visit: 1 Visit OT Evaluation $OT Eval Moderate Complexity: 1 Mod  Kari Baars, OT Acute Rehabilitation  Services [phone] Office- [phone], Edwena Felty D 05/27/2019, 3:21 PM

## 2019-05-27 NOTE — TOC Progression Note (Signed)
Transition of Care [facility]) - Progression Note    Patient Details  Name: Nicole Sellers MRN: [id number] Date of Birth: May 19, 1950  [facility] CM/SW Contact  Quianna Avery, Juliann Pulse, RN Phone Number: 05/27/2019, 11:17 AM  Clinical Narrative:If home hospice or residential hospice needed I can assist w/referral to case management to offer choice-MD/Palliative Team/nsg aware.        Expected Discharge Plan: Home/Self Care Barriers to Discharge: Continued Medical Work up  Expected Discharge Plan and Services Expected Discharge Plan: Home/Self Care   Discharge Planning Services: CM Consult   Living arrangements for the past 2 months: Single Family Home                                       Social Determinants of Health (SDOH) Interventions    Readmission Risk Interventions Readmission Risk Prevention Plan 05/22/2019 05/22/2019 10/02/2018  Transportation Screening - Complete Complete  Medication Review [employer]) - Complete Complete  PCP or Specialist appointment within 3-5 days of discharge - Complete Not Complete  PCP/Specialist Appt Not Complete comments - - not ready to dc  HRI or [facility] - Complete Complete  SW Recovery Care/Counseling Consult - Complete Complete  SW Consult Not Complete Comments - - -  Palliative Care Screening Complete Not Applicable Not Applicable  Skilled Nursing Facility - Not Applicable Not Applicable  Some recent data might be hidden

## 2019-05-27 NOTE — Care Management Important Message (Signed)
Important Message  Patient Details IM Letter given to Evette Cristal SW Case Manager to present to the Patient Name: Nicole Sellers MRN: [id number] Date of Birth: 12/29/1950   Medicare Important Message Given:  Yes     Kerin Salen 05/27/2019, 12:02 PM

## 2019-05-28 DIAGNOSIS — N185 Chronic kidney disease, stage 5: Secondary | ICD-10-CM

## 2019-05-28 LAB — HEPARIN LEVEL (UNFRACTIONATED): Heparin Unfractionated: >2.2 IU/mL — ABNORMAL HIGH (ref 0.30–0.70)

## 2019-05-28 LAB — APTT: aPTT: 39 seconds — ABNORMAL HIGH (ref 24–36)

## 2019-05-28 MED ORDER — LIDOCAINE-EPINEPHRINE 1 %-1:100000 IJ SOLN
10.0000 mL | Freq: Once | INTRAMUSCULAR | Status: AC
  Administered 2019-05-28: 10 mL via INTRADERMAL
  Filled 2019-05-28: qty 10

## 2019-05-28 MED ORDER — HEPARIN (PORCINE) 25000 UT/250ML-% IV SOLN
700.0000 [IU]/h | INTRAVENOUS | Status: DC
  Administered 2019-05-28: 700 [IU]/h via INTRAVENOUS
  Filled 2019-05-28: qty 250

## 2019-05-28 MED ORDER — SODIUM CHLORIDE 0.9 % IV SOLN: INTRAVENOUS | Status: DC

## 2019-05-28 MED ORDER — SODIUM CHLORIDE 0.9 % IV SOLN: 250.0000 mL | INTRAVENOUS | Status: DC | PRN

## 2019-05-28 MED ORDER — SODIUM CHLORIDE 0.9% FLUSH
3.0000 mL | Freq: Two times a day (BID) | INTRAVENOUS | Status: DC
  Administered 2019-05-30 – 2019-05-31 (×2): 3 mL via INTRAVENOUS

## 2019-05-28 MED ORDER — SODIUM CHLORIDE 0.9% FLUSH: 3.0000 mL | INTRAVENOUS | Status: DC | PRN

## 2019-05-28 MED ORDER — MEGESTROL ACETATE 40 MG PO TABS
40.0000 mg | ORAL_TABLET | Freq: Every day | ORAL | Status: DC
  Administered 2019-05-28 – 2019-05-29 (×2): 40 mg via ORAL
  Filled 2019-05-28 (×2): qty 1

## 2019-05-28 MED ORDER — ASPIRIN 81 MG PO CHEW
81.0000 mg | CHEWABLE_TABLET | ORAL | Status: AC
  Administered 2019-05-29: 81 mg via ORAL
  Filled 2019-05-28: qty 1

## 2019-05-28 NOTE — Progress Notes (Addendum)
Progress Note  Patient Name: Nicole Sellers Date of Encounter: 05/28/2019  Primary Cardiologist: Quay Burow, MD   Subjective   Patient was going to be discharged home with hospice but after discussion with family would like to reconsider cardiac catheterization. Patient is still a little unsure but would like to go ahead with procedure. No chest pain. Breathing stable.  Inpatient Medications    Scheduled Meds: . amoxicillin-clavulanate  1 tablet Oral Q12H  . apixaban  5 mg Oral BID  . aspirin  81 mg Oral Daily  . atorvastatin  10 mg Oral QHS  . Chlorhexidine Gluconate Cloth  6 each Topical Daily  . darbepoetin (ARANESP) injection - NON-DIALYSIS  100 mcg Subcutaneous Q Wed-1800  . fentaNYL (SUBLIMAZE) injection  12.5 mcg Intravenous Once  . gabapentin  100 mg Oral QHS  . iron polysaccharides  150 mg Oral BID  . levothyroxine  50 mcg Oral Daily  . lidocaine-EPINEPHrine  10 mL Intradermal Once  . linagliptin  5 mg Oral Daily  . megestrol  40 mg Oral Daily  . metoprolol succinate  25 mg Oral Daily  . omeprazole  20 mg Oral Daily  . potassium chloride  40 mEq Oral BID  . sodium chloride flush  10-40 mL Intracatheter Q12H  . torsemide  60 mg Oral BID   Continuous Infusions: . ferumoxytol 510 mg (05/22/19 1602)   PRN Meds: acetaminophen **OR** acetaminophen, albuterol, naphazoline-glycerin, ondansetron **OR** ondansetron (ZOFRAN) IV, sodium chloride flush, traMADol   Vital Signs    Vitals:   05/28/19 0509 05/28/19 0510 05/28/19 0911 05/28/19 1233  BP: 118/76  118/78 115/68  Pulse: 91  91 85  Resp: 19  20 20   Temp: 99.2 F (37.3 C)   97.9 F (36.6 C)  TempSrc: Oral   Oral  SpO2: 99%  100% 100%  Weight:  68.7 kg    Height:        Intake/Output Summary (Last 24 hours) at 05/28/2019 1452 Last data filed at 05/28/2019 1402 Gross per 24 hour  Intake 120 ml  Output 700 ml  Net -580 ml   Last 3 Weights 05/28/2019 05/27/2019 05/24/2019  Weight (lbs) 151 lb 7.3 oz  152 lb 12.5 oz 151 lb 3.2 oz  Weight (kg) 68.7 kg 69.3 kg 68.584 kg      Telemetry    NSR, HR 80-90s, PVCs - Personally Reviewed  ECG    No new - Personally Reviewed  Physical Exam   GEN: No acute distress.   Neck: No JVD Cardiac: RRR, no murmurs, rubs, or gallops.  Respiratory: Clear to auscultation bilaterally. GI: Soft, nontender, non-distended  MS: No edema; No deformity. Neuro:  Nonfocal  Psych: Normal affect   Labs    High Sensitivity Troponin:  No results for input(s): TROPONINIHS in the last 720 hours.    Chemistry Recent Labs  Lab 05/21/19 1604 05/21/19 1604 05/22/19 0500 05/22/19 0500 05/23/19 0307 05/24/19 0627 05/25/19 0341 05/26/19 0408 05/27/19 0359  NA 136   < > 138   < > 137   < > 137 140 138  K 4.1   < > 3.8   < > 3.4*   < > 2.9* 3.8 4.1  CL 107   < > 108   < > 105   < > 104 105 104  CO2 17*   < > 19*   < > 22   < > 22 26 25   GLUCOSE 153*   < > 167*   < >  184*   < > 204* 213* 213*  BUN 73*   < > 67*   < > 64*   < > 47* 50* 52*  CREATININE 3.29*   < > 3.16*   < > 2.81*   < > 2.33* 2.59* 2.83*  CALCIUM 8.8*   < > 8.9   < > 8.7*   < > 8.5* 8.6* 8.9  PROT 6.2*  --  6.4*  --  6.1*  --   --   --   --   ALBUMIN 3.2*  --  3.3*  --  3.1*  --   --   --   --   AST 101*  --  85*  --  57*  --   --   --   --   ALT 187*  --  169*  --  136*  --   --   --   --   ALKPHOS 159*  --  143*  --  147*  --   --   --   --   BILITOT 0.6  --  0.5  --  0.6  --   --   --   --   GFRNONAA 13*   < > 14*   < > 16*   < > 20* 18* 16*  GFRAA 16*   < > 16*   < > 19*   < > 24* 21* 19*  ANIONGAP 12   < > 11   < > 10   < > 11 9 9    < > = values in this interval not displayed.     Hematology Recent Labs  Lab 05/25/19 0341 05/26/19 0408 05/27/19 0359  WBC 9.3 7.1 5.6  RBC 2.58* 2.52* 2.60*  HGB 7.9* 7.7* 8.1*  HCT 26.2* 25.6* 26.6*  MCV 101.6* 101.6* 102.3*  MCH 30.6 30.6 31.2  MCHC 30.2 30.1 30.5  RDW 19.2* 18.6* 18.7*  PLT 281 246 242    BNP Recent Labs  Lab  05/22/19 1633  BNP >4,500.0*     DDimer No results for input(s): DDIMER in the last 168 hours.   Radiology    No results found.  Cardiac Studies   Lexiscan Myoview 06/14/2018:  Nuclear stress EF: 56%.  The left ventricular ejection fraction is normal (55-65%).  There was no ST segment deviation noted during stress.  No T wave inversion was noted during stress.  Defect 1: There is a small defect of mild severity present in the basal anterior location.  The study is normal.  This is a low risk study.  Low risk stress nuclear study with normal perfusion and normal left ventricular regional and global systolic function. _______________  Echocardiogram 05/22/2019: Impressions: 1. RVEF is moderately decreased.  2. Since the last study on 03/31/2019 there has been a significant change,  LVEF has decreased from 50-55% (with akinesis in the basal and mid  anteroseptal and anterior walls) to 15-20% with severe diffuse hypokinesis  and paradoxical septal motion.  3. There is no thrombus on the left ventricle (on Definity echo contrast  imaging).  4. Left ventricular ejection fraction, by estimation, is 20 to 25%. The  left ventricle has severely decreased function. The left ventricle  demonstrates global hypokinesis. The left ventricular internal cavity size  was moderately dilated. Left  ventricular diastolic parameters are consistent with Grade II diastolic  dysfunction (pseudonormalization). Elevated left atrial pressure. The  average left ventricular global longitudinal strain is -7.0 %.  5.  Right ventricular systolic function is moderately reduced. The right  ventricular size is moderately enlarged. There is mildly elevated  pulmonary artery systolic pressure. The estimated right ventricular  systolic pressure is 30.0 mmHg.  6. Left atrial size was moderately dilated.  7. Right atrial size was mildly dilated.  8. The mitral valve is normal in structure. Moderate  mitral valve  regurgitation. No evidence of mitral stenosis.  9. Tricuspid valve regurgitation is moderate.  10. The aortic valve is normal in structure. Aortic valve regurgitation is  mild. Mild to moderate aortic valve sclerosis/calcification is present,  without any evidence of aortic stenosis.  11. The inferior vena cava is normal in size with greater than 50%  respiratory variability, suggesting right atrial pressure of 3 mmHg.   Patient Profile     69 y.o. 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 Weight down 6lbs since admission - continue metoprolol 25mg  daily - No ACE/ARB/ARNI given renal function - patient declined heart cath at first given risk of dialysis but after discussion with family would like to reconsider. Creatinine is 2.86 today (which is lower than admission creatinine of 3.29). Will plan for cardiac catheterization tomorrow. NPO at midnight. If creatinine is significantly worse tomorrow morning I could see holding on procedure. MD to see  PAD - s/p PTA/stent to the right SFA in 2014  ultimately undergoing 2nd toe amputation - s/p PTA/stent to right external iliac artery in 04/2017 - Recent dopplers 03/2019 showed patent right SFA stent and 30-40% stenosis of left common femoral artery as well as occlusion of the right peroneal artery, left SFA with reconstitution in the distal segment - B/L ABIs and TBIs decreased from 2019 - continue aspirin and statin - follows with Dr. Gwenlyn Found  Carotid artery stenosis - mild RICA and moderate L ICA 06/2018 - aspirin and statin  HTN - continue BB  HLD - LDL 53 - continue atorvastatin  DVT - diagnosed on 11/2018 but a/c not started due to loss of follow-up and subsequently started on 03/2019  CKD stage 4-5 - creatinine on admission 3.29 - Creatinine trend 2.33>2.59>2.83 - patient is not interested in dialysis  For questions or updates, please contact [facility] Please consult [email or web address] for contact info under     Signed, Cadence Ninfa Meeker, PA-C  05/28/2019, 2:52 PM     The patient was seen, examined and discussed with Cadence Ninfa Meeker, PA-C   and I agree with the above.   Involvement of hospice was discussed with the patient, she had a long discussion with her family and they  decided to go more aggressive route and undergo cardiac catheterization that was previously offered.  The risk here is acute on chronic kidney failure secondary to contrast nephropathy.  Patient's creatinine today is 2.83, previously as low as 233, 2 days GFR 16.  Will follow tomorrow and only proceed with cardiac cath if creatinine stable and around 2.5.  She appears euvolemic, will hold torsemide tonight and tomorrow morning.  Ena Dawley, MD 05/28/2019

## 2019-05-28 NOTE — Consult Note (Signed)
Reason for Consult:Left wrist abscess Referring Physician: [name] is an 69 y.o. female.  HPI: Nicole Sellers has been admitted for about a week. She had an IV in her left wrist that was removed. Where the IV was she developed a small abscess that seems to be getting worse and hand surgery was consulted. She denies fevers, chills, or sweats. She is RHD.  Past Medical History:  Diagnosis Date  . Age-related nuclear cataract, right eye   . Allergy   . Anemia   . Arthritis    "joints sometimes" (12/25/2012)  . Clotting disorder ([place])   . Colon cancer ([place])   . Critical lower limb ischemia   . Diverticulosis   . Gangrene of toe, Rt second toe 12/25/2012  . GERD (gastroesophageal reflux disease)   . High cholesterol   . Hypertension   . Hypothyroidism   . PAD (peripheral artery disease) ([place])   . PAD (peripheral artery disease) ([place])   . Peripheral neuropathy   . Rectal cancer ([place])   . S/P arterial stent, 12/25/13, successful diamondback orbital rotational arthrectomy, PTA using chocolate  balloon and stenting using I DEV stent of long segment calcified high-grade proximal and mid r 12/25/2012  . Tobacco abuse   . Type II diabetes mellitus ([place])     Past Surgical History:  Procedure Laterality Date  . ABDOMINAL AORTAGRAM  12/20/2012   Procedure: ABDOMINAL AORTAGRAM;  Surgeon: Lorretta Harp, MD;  Location: [facility] CATH LAB;  Service: Cardiovascular;;  . ABDOMINAL HYSTERECTOMY  2000  . ANGIOPLASTY / STENTING FEMORAL Right 12/25/2012  . ATHERECTOMY Right 12/25/2012   Procedure: ATHERECTOMY;  Surgeon: Lorretta Harp, MD;  Location: [facility] CATH LAB;  Service: Cardiovascular;  Laterality: Right;  right SFA  . COLONOSCOPY    . DIVERTING ILEOSTOMY N/A 08/03/2018   Procedure: DIVERTING LOOP ILEOSTOMY;  Surgeon: Leighton Ruff, MD;  Location: [facility] ORS;  Service: General;  Laterality: N/A;  . ESOPHAGOGASTRODUODENOSCOPY N/A 10/04/2018   Procedure: ESOPHAGOGASTRODUODENOSCOPY (EGD);   Surgeon: Carol Ada, MD;  Location: [facility];  Service: Endoscopy;  Laterality: N/A;  . EYE SURGERY Left    cataract  . HERNIA REPAIR    . LOWER EXTREMITY ANGIOGRAM N/A 12/20/2012   Procedure: LOWER EXTREMITY ANGIOGRAM;  Surgeon: Lorretta Harp, MD;  Location: [facility] CATH LAB;  Service: Cardiovascular;  Laterality: N/A;  . LOWER EXTREMITY ANGIOGRAM N/A 12/25/2012   Procedure: LOWER EXTREMITY ANGIOGRAM;  Surgeon: Lorretta Harp, MD;  Location: [facility] CATH LAB;  Service: Cardiovascular;  Laterality: N/A;  . LOWER EXTREMITY INTERVENTION  05/08/2017  . LOWER EXTREMITY INTERVENTION Bilateral 05/08/2017   Procedure: LOWER EXTREMITY INTERVENTION;  Surgeon: Lorretta Harp, MD;  Location: [place] CV LAB;  Service: Cardiovascular;  Laterality: Bilateral;  . PERIPHERAL VASCULAR BALLOON ANGIOPLASTY Right 05/08/2017   Procedure: PERIPHERAL VASCULAR BALLOON ANGIOPLASTY;  Surgeon: Lorretta Harp, MD;  Location: [place] CV LAB;  Service: Cardiovascular;  Laterality: Right;  sfa  . PERIPHERAL VASCULAR INTERVENTION Right 05/08/2017   Procedure: PERIPHERAL VASCULAR INTERVENTION;  Surgeon: Lorretta Harp, MD;  Location: [place] CV LAB;  Service: Cardiovascular;  Laterality: Right;  ext iliac  . PORTACATH PLACEMENT Right 11/22/2017   Procedure: INSERTION PORT-A-CATH;  Surgeon: Leighton Ruff, MD;  Location: [facility];  Service: General;  Laterality: Right;  . SAVORY DILATION N/A 10/04/2018   Procedure: SAVORY DILATION;  Surgeon: Carol Ada, MD;  Location: [facility] ENDOSCOPY;  Service: Endoscopy;  Laterality: N/A;  . TOE SURGERY Right  2n toe   . UMBILICAL HERNIA REPAIR  2000  . UMBILICAL HERNIA REPAIR N/A 08/03/2018   Procedure: UMBILICAL HERNIA REPAIR;  Surgeon: Leighton Ruff, MD;  Location: [facility];  Service: General;  Laterality: N/A;  . XI ROBOTIC ASSISTED LOWER ANTERIOR RESECTION N/A 08/03/2018   Procedure: XI ROBOTIC ASSISTED LOWER ANTERIOR RESECTION; DIAGNOSTIC FLEXIBLE SIGMOIDOSCOPY;  INTRAOPERATIVE ASSESSMENT OF VASCULAR PERFUSION;  Surgeon: Leighton Ruff, MD;  Location: [facility];  Service: General;  Laterality: N/A;    Family History  Problem Relation Age of Onset  . Hypertension Mother   . Stroke Mother   . Cancer Father        prostate  . Mental illness Sister   . Diabetes Brother   . Hypertension Brother   . Cancer Brother 48       prostate  . Diabetes Brother   . Hypertension Brother   . Cancer Brother        "tumors on neck and body"     Social History:  reports that she has quit smoking. Her smoking use included cigarettes. She has a 24.00 pack-year smoking history. She has never used smokeless tobacco. She reports that she does not drink alcohol or use drugs.  Allergies:  Allergies  Allergen Reactions  . Kiwi Extract Itching and Other (See Comments)    Throat itching   . Lisinopril Other (See Comments)    Bruising   . Plavix [Clopidogrel] Other (See Comments)    "Made me feel badly- didn't agree with my system"    Medications: I have reviewed the patient's current medications.  Results for orders placed or performed during the hospital encounter of 05/21/19 (from the past 48 hour(s))  Basic metabolic panel     Status: Abnormal   Collection Time: 05/27/19  3:59 AM  Result Value Ref Range   Sodium 138 135 - 145 mmol/L   Potassium 4.1 3.5 - 5.1 mmol/L   Chloride 104 98 - 111 mmol/L   CO2 25 22 - 32 mmol/L   Glucose, Bld 213 (H) 70 - 99 mg/dL    Comment: Glucose reference range applies only to samples taken after fasting for at least 8 hours.   BUN 52 (H) 8 - 23 mg/dL   Creatinine, Ser 2.83 (H) 0.44 - 1.00 mg/dL   Calcium 8.9 8.9 - 10.3 mg/dL   GFR calc non Af Amer 16 (L) >60 mL/min   GFR calc Af Amer 19 (L) >60 mL/min   Anion gap 9 5 - 15    Comment: Performed at [facility], [place] [address] [place], [place] [address]  CBC     Status: Abnormal   Collection Time: 05/27/19  3:59 AM  Result Value Ref Range   WBC 5.6 4.0  - 10.5 K/uL   RBC 2.60 (L) 3.87 - 5.11 MIL/uL   Hemoglobin 8.1 (L) 12.0 - 15.0 g/dL   HCT 26.6 (L) 36.0 - 46.0 %   MCV 102.3 (H) 80.0 - 100.0 fL   MCH 31.2 26.0 - 34.0 pg   MCHC 30.5 30.0 - 36.0 g/dL   RDW 18.7 (H) 11.5 - 15.5 %   Platelets 242 150 - 400 K/uL   nRBC 0.5 (H) 0.0 - 0.2 %    Comment: Performed at [facility], [place] [address], [place],  [address]    No results found.  Review of Systems  Constitutional: Negative for chills, diaphoresis and fever.  HENT: Negative for ear discharge, ear pain, hearing  loss and tinnitus.   Eyes: Negative for photophobia and pain.  Respiratory: Negative for cough and shortness of breath.   Cardiovascular: Negative for chest pain.  Gastrointestinal: Negative for abdominal pain, nausea and vomiting.  Genitourinary: Negative for dysuria, flank pain, frequency and urgency.  Musculoskeletal: Positive for arthralgias (Left wrist). Negative for back pain, myalgias and neck pain.  Neurological: Negative for dizziness and headaches.  Hematological: Does not bruise/bleed easily.  Psychiatric/Behavioral: The patient is not nervous/anxious.    Blood pressure 115/68, pulse 85, temperature 97.9 F (36.6 C), temperature source Oral, resp. rate 20, height 5\' 6"  (1.676 m), weight 68.7 kg, SpO2 100 %. Physical Exam  Constitutional: She appears well-developed and well-nourished. No distress.  HENT:  Head: Normocephalic and atraumatic.  Eyes: Conjunctivae are normal. Right eye exhibits no discharge. Left eye exhibits no discharge. No scleral icterus.  Cardiovascular: Normal rate and regular rhythm.  Respiratory: Effort normal. No respiratory distress.  Musculoskeletal:     Cervical back: Normal range of motion.     Comments: Left shoulder, elbow, wrist, digits- no skin wounds, pustule noted radial aspect wrist with underlying fluctuance and extension proximally, able to AROM wrist, no instability, no blocks to motion  Sens   Ax/R/M/U intact  Mot   Ax/ R/ PIN/ M/ AIN/ U intact  Rad 2+  Neurological: She is alert.  Skin: Skin is warm and dry. She is not diaphoretic.  Psychiatric: She has a normal mood and affect. Her behavior is normal.    Assessment/Plan: Left wrist abscess -- Bedside I&D and cultures. Local wound care. 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

## 2019-05-28 NOTE — Procedures (Signed)
Procedure: Left wrist I&D  Indication: Left wrist abscess  Surgeon: Silvestre Gunner, PA-C  Assist: None  Anesthesia: 68ml lidocaine w/epi as a field block  EBL: None  Complications: None  Findings: After risks/benefits explained patient desires to undergo procedure. Consent obtained and time out performed. The left wrist/FA was infiltrated with lidocaine. Anesthesia was confirmed and the area was sterilely prepped and draped. A 3cm incision was carried out from the pustule proximally. Approximately 2-43ml purulent discharge encountered and collected for culture. Wound was explored with forceps without any additional pockets of purulence encountered. The wound was then flushed with 1L NS and dressed. Pt tolerated the procedure well.    Lisette Abu, PA-C Orthopedic Surgery [phone]

## 2019-05-28 NOTE — Progress Notes (Signed)
[place] for IV heparin Indication: PTA Eliquis on hold (Hx DVT)  Allergies  Allergen Reactions  . Kiwi Extract Itching and Other (See Comments)    Throat itching   . Lisinopril Other (See Comments)    Bruising   . Plavix [Clopidogrel] Other (See Comments)    "Made me feel badly- didn't agree with my system"    Patient Measurements: Height: 5\' 6"  (167.6 cm) Weight: 68.7 kg (151 lb 7.3 oz) IBW/kg (Calculated) : 59.3 Heparin Dosing Weight: TBW  Vital Signs: Temp: 97.9 F (36.6 C) (05/18 1233) Temp Source: Oral (05/18 1233) BP: 110/78 (05/18 1630) Pulse Rate: 85 (05/18 1233)  Labs: Recent Labs    05/26/19 0408 05/27/19 0359  HGB 7.7* 8.1*  HCT 25.6* 26.6*  PLT 246 242  CREATININE 2.59* 2.83*    Estimated Creatinine Clearance: 17.1 mL/min (A) (by C-G formula based on SCr of 2.83 mg/dL (H)).   Medical History: Past Medical History:  Diagnosis Date  . Age-related nuclear cataract, right eye   . Allergy   . Anemia   . Arthritis    "joints sometimes" (12/25/2012)  . Clotting disorder ([place])   . Colon cancer ([place])   . Critical lower limb ischemia   . Diverticulosis   . Gangrene of toe, Rt second toe 12/25/2012  . GERD (gastroesophageal reflux disease)   . High cholesterol   . Hypertension   . Hypothyroidism   . PAD (peripheral artery disease) ([place])   . PAD (peripheral artery disease) ([place])   . Peripheral neuropathy   . Rectal cancer ([place])   . S/P arterial stent, 12/25/13, successful diamondback orbital rotational arthrectomy, PTA using chocolate  balloon and stenting using I DEV stent of long segment calcified high-grade proximal and mid r 12/25/2012  . Tobacco abuse   . Type II diabetes mellitus ([facility])     Medications:  Medications Prior to Admission  Medication Sig Dispense Refill Last Dose  . albuterol (PROVENTIL HFA;VENTOLIN HFA) 108 (90 BASE) MCG/ACT inhaler Inhale 2 puffs into the lungs every 6 (six) hours as  needed for wheezing or shortness of breath.   Past Week at Unknown time  . apixaban (ELIQUIS) 5 MG TABS tablet Take 5 mg by mouth 2 (two) times daily.   05/20/2019 at 1800  . aspirin 81 MG tablet Take 81 mg by mouth daily.   05/20/2019 at Unknown time  . atorvastatin (LIPITOR) 10 MG tablet Take 1 tablet (10 mg total) by mouth daily. (Patient taking differently: Take 10 mg by mouth at bedtime. ) 90 tablet 1 05/20/2019 at Unknown time  . Calcium Carbonate-Vitamin D (CALCIUM 600+D PO) Take 1 tablet by mouth daily.   Past Week at Unknown time  . cetirizine (ZYRTEC) 10 MG tablet Take 10 mg by mouth daily.   Past Week at Unknown time  . Cholecalciferol (VITAMIN D3) 50 MCG (2000 UT) TABS Take 4,000 Units by mouth daily.   Past Week at Unknown time  . FERREX 150 150 MG capsule Take 150 mg by mouth 2 (two) times daily.  11 Past Week at Unknown time  . furosemide (LASIX) 40 MG tablet Take 60 mg by mouth daily.    Past Week at Unknown time  . gabapentin (NEURONTIN) 300 MG capsule Take 300 mg by mouth 2 (two) times daily.    05/20/2019 at Unknown time  . levothyroxine (SYNTHROID, LEVOTHROID) 50 MCG tablet Take 1 tablet (50 mcg total) by mouth daily. 90 tablet 1 05/20/2019 at  Unknown time  . Multiple Vitamins-Minerals (SENIOR MULTIVITAMIN PLUS PO) Take 1 tablet by mouth daily with breakfast.   Past Week at Unknown time  . omeprazole (PRILOSEC OTC) 20 MG tablet Take 20 mg by mouth daily before breakfast.   05/21/2019 at Unknown time  . Psyllium (METAMUCIL FIBER PO) Take 1 Scoop by mouth See admin instructions. Mix 1 scoopful into 6-8 ounces of desired beverage and drink two times a day   Past Week at Unknown time  . sitaGLIPtin (JANUVIA) 25 MG tablet Take 1 tablet (25 mg total) by mouth daily. 30 tablet 0 05/21/2019 at Unknown time  . sodium zirconium cyclosilicate (LOKELMA) 5 g packet Take 5 g by mouth 2 (two) times daily.   05/20/2019 at Unknown time  . tetrahydrozoline-zinc (VISINE-AC) 0.05-0.25 % ophthalmic solution  Place 1 drop into both eyes 2 (two) times daily as needed (dry eyes).   unknown  . traMADol (ULTRAM) 50 MG tablet Take 1 tablet (50 mg total) by mouth every 12 (twelve) hours as needed for moderate pain. 10 tablet 0 Past Week at Unknown time  . Triamcinolone Acetonide (NASACORT ALLERGY 24HR NA) Place 1 spray into both nostrils daily as needed (allergies).    unknown  . Trolamine Salicylate (ASPERCREME EX) Apply 1 application topically daily as needed (to affected areas, for joint pain).    unknown  . vitamin B-12 (CYANOCOBALAMIN) 500 MCG tablet Take 500 mcg by mouth daily.   Past Week at Unknown time  . apixaban (ELIQUIS) 5 MG TABS tablet Take 1 tablet (5 mg total) by mouth 2 (two) times daily. 60 tablet 0    Scheduled:  . amoxicillin-clavulanate  1 tablet Oral Q12H  . aspirin  81 mg Oral Daily  . [START ON 05/29/2019] aspirin  81 mg Oral Pre-Cath  . atorvastatin  10 mg Oral QHS  . Chlorhexidine Gluconate Cloth  6 each Topical Daily  . darbepoetin (ARANESP) injection - NON-DIALYSIS  100 mcg Subcutaneous Q Wed-1800  . fentaNYL (SUBLIMAZE) injection  12.5 mcg Intravenous Once  . gabapentin  100 mg Oral QHS  . iron polysaccharides  150 mg Oral BID  . levothyroxine  50 mcg Oral Daily  . linagliptin  5 mg Oral Daily  . megestrol  40 mg Oral Daily  . metoprolol succinate  25 mg Oral Daily  . omeprazole  20 mg Oral Daily  . potassium chloride  40 mEq Oral BID  . sodium chloride flush  10-40 mL Intracatheter Q12H  . sodium chloride flush  3 mL Intravenous Q12H   Infusions:  . sodium chloride    . [START ON 05/29/2019] sodium chloride    . ferumoxytol 510 mg (05/22/19 1602)  . heparin      Assessment: 89 yoF with PMH rectal CA in remission, CKD4, DM2, PAD, DVT in 11/2018 (but didn't start Eliquis until 03/2019) admitted for AECHF. Planning for left heart cath so transitioning to heparin periprocedurally   Baseline heparin level, aPTT: pending (anticipate HL will be elevated d/t DOAC, and  will likely take longer than usual to return to normal given CKD4)  Prior anticoagulation: Eliquis 5 mg PO daily, LD 5/18 at 0915  Significant events:  Today, 05/28/2019:  CBC: Hgb low but stable; Plt WNL  Note from previous admission patient was therapeutic on heparin at 700 units/hr  No bleeding or infusion issues per nursing  Goal of Therapy: Heparin level 0.3-0.7 units/ml Monitor platelets by anticoagulation protocol: Yes  Plan:  Heparin 700 units/hr IV infusion,  no bolus  Check aPTT 8 hrs after start  Daily CBC and daily heparin level; aPTT as needed while DOAC effects persist  Monitor for signs of bleeding or thrombosis  Reuel Boom, PharmD, BCPS [phone] 05/28/2019, 8:25 PM

## 2019-05-28 NOTE — Progress Notes (Signed)
Nutrition Follow-up  DOCUMENTATION CODES:   Not applicable  INTERVENTION:  - continue 1400 and HS snacks.   NUTRITION DIAGNOSIS:   Inadequate oral intake related to poor appetite as evidenced by per patient/family report. 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  Patient is consuming <25% of meals. She was weighed on 5/12 at which time she weighed 157 lb and weight on 5/13 was 153 lb. Weight today is 151 lb.   Plan is for discharge home with hospice services once services have been set up.    Labs reviewed; BUN: 52 mg/dl, creatinine: 2.83 mg/dl, GFR: 19 ml/min. Medications reviewed; 510 mg feraheme x1 dose 5/12 and x1 dose 5/19, 50 mcg oral synthroid/day, 5 mg tradjenta/day, 40 mg megace/day, 40 mEq Klor-Con BID.    Diet Order:   Diet Order            Diet renal/carb modified with fluid restriction Diet-HS Snack? Nothing; Fluid restriction: 1200 mL Fluid; Room service appropriate? Yes; Fluid consistency: Thin  Diet effective now              EDUCATION NEEDS:   Education needs have been addressed  Skin:  Skin Assessment: Reviewed RN Assessment  Last BM:  5/28 (250 ml via ileostomy)  Height:   Ht Readings from Last 1 Encounters:  05/22/19 5\' 6"  (1.676 m)    Weight:   Wt Readings from Last 1 Encounters:  05/28/19 68.7 kg    Ideal Body Weight:  59.1 kg  BMI:  Body mass index is 24.45 kg/m.  Estimated Nutritional Needs:   Kcal:   1600-1800 kcal  Protein:  60-70 grams  Fluid:  >/= 1.5 L/day     Jarome Matin, MS, RD, LDN, CNSC Inpatient Clinical Dietitian RD pager # available in [facility]  After hours/weekend pager # available in [facility]

## 2019-05-28 NOTE — TOC Progression Note (Signed)
Transition of Care [facility]) - Progression Note    Patient Details  Name: LAYANI FORONDA MRN: [id number] Date of Birth: Jun 16, 1950  [facility] CM/SW Contact  Madaline Lefeber, Juliann Pulse, RN Phone Number: 05/28/2019, 10:51 AM  Clinical Narrative: Awaiting clarification of GOC. [place] also updated-they have already delivered dme to home,patient has travel 02 tank in rm.      Expected Discharge Plan: Home w Hospice Care Barriers to Discharge: Continued Medical Work up  Expected Discharge Plan and Services Expected Discharge Plan: [place]   Discharge Planning Services: CM Consult   Living arrangements for the past 2 months: Single Family Home                           HH Arranged: RN [facility] Agency: Hospice and [place] Date [place]: 05/27/19 Time [place]: [place] Representative spoke with at [place]: [place] Determinants of Health (SDOH) Interventions    Readmission Risk Interventions Readmission Risk Prevention Plan 05/22/2019 05/22/2019 10/02/2018  Transportation Screening - Complete Complete  Medication Review [employer]) - Complete Complete  PCP or Specialist appointment within 3-5 days of discharge - Complete Not Complete  PCP/Specialist Appt Not Complete comments - - not ready to dc  HRI or [place] - Complete Complete  SW Recovery Care/Counseling Consult - Complete Complete  SW Consult Not Complete Comments - - -  Palliative Care Screening Complete Not Applicable Not Applicable  Skilled Nursing Facility - Not Applicable Not Applicable  Some recent data might be hidden

## 2019-05-28 NOTE — Progress Notes (Signed)
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  She has been followed by nephrology as an outpatient and she and her nephrologist discussed about dialysis however she has been refusing dialysis.  It appears that currently she is willing to do that in the ED she was found to be fluid overloaded with pulmonary edema, small bilateral pleural effusion and lower extremity swelling.  She was placed on IV Lasix.  Nephrology was consulted.  She underwent a 2D echo which showed new onset systolic CHF for which cardiology was consulted.  Patient initially refused work-up by cardiology/cardiac catheterization, refused dialysis and consulted and signed off.  On the day of discharge home with hospice 5/18 she changed her mind after discussing with her son and brother.  Cardiology reconsulted.  Subjective / 24h Interval events: Complains of weakness, shortness of breath and swelling have improved  Assessment & Plan: Principal Problem Acute hypoxic respiratory failure in the setting of acute pulmonary edema multifactorial due to acute systolic CHF and progression of chronic kidney disease -appreciate nephrology assistance, she has been placed on high-dose IV Lasix 120 every 8.  She became euvolemic and then eventually converted to torsemide at nephrologist recommendations.  Initially she was refusing dialysis but now agreeable after discussing with her family.  Currently she has no indication for acute dialysis, I will continue torsemide,  monitor ins and outs and renal function.  I have reconsulted cardiology as below and based on the renal function over the next day or so nephrology may be reconsulted again  Active Problems Acute kidney injury on chronic kidney disease stage IV-nephrology consulted, received Lasix challenge with improvement in her volume status.  Now on oral diuretics.  Nephrology signed off, I will not reconsult for now since she has no acute dialysis needs  Acute systolic CHF-appreciate cardiology follow-up, 2D echo done showed a depressed EF which is new.  Ischemic work-up was recommended however patient did not want dialysis nor catheterization, however after talking to her family changed her mind on 5/18.  Cardiology re-consulted today  Anemia of chronic renal disease-continue p.o. iron, hemoglobin overall stable  Dysphagia-intermittent, passed speech evaluation without difficulties  Elevated LFTs-possible congestion from fluid overload, LFTs improving  History of DVT-continue Eliquis  Rectal cancer- in remission  DM2-continue Januvia  Goals of care-patient has been back-and-forth and changed her mind several times during this hospital stay.  Up until today she did not allow me to talk directly to her son but fortunately we were able to sit down and have a good conversation.  Son was not fully aware of what was going on even though the patient kept telling me every day that she will update the son.  Son was not fully aware that the patient would not let MD or RN called him.  Patient now states that she is willing to undergo dialysis if needed, as well as cardiac work-up that was recommended by cardiology.  I have briefly updated Dr. Domingo Cocking with palliative who does not plan to follow-up unless we consult again  Left wrist abscess-small raised  abscess on the left wrist where the IV was, patient was started on doxycycline on 5/17 however abscess seems a little bit larger today.  I have called [facility] Orth[facility] to  come and do a bedside I&D if indicated from their standpoint  Scheduled Meds: . amoxicillin-clavulanate  1 tablet Oral Q12H  . apixaban  5 mg Oral BID  . aspirin  81 mg Oral Daily  . atorvastatin  10 mg Oral QHS  . Chlorhexidine Gluconate Cloth  6 each Topical Daily  . darbepoetin (ARANESP) injection - NON-DIALYSIS  100 mcg Subcutaneous Q Wed-1800  . fentaNYL (SUBLIMAZE) injection  12.5 mcg Intravenous Once  . gabapentin  100 mg Oral QHS  . iron polysaccharides  150 mg Oral BID  . levothyroxine  50 mcg Oral Daily  . lidocaine-EPINEPHrine  10 mL Intradermal Once  . linagliptin  5 mg Oral Daily  . megestrol  40 mg Oral Daily  . metoprolol succinate  25 mg Oral Daily  . omeprazole  20 mg Oral Daily  . potassium chloride  40 mEq Oral BID  . sodium chloride flush  10-40 mL Intracatheter Q12H  . torsemide  60 mg Oral BID   Continuous Infusions: . ferumoxytol 510 mg (05/22/19 1602)   PRN Meds:.acetaminophen **OR** acetaminophen, albuterol, naphazoline-glycerin, ondansetron **OR** ondansetron (ZOFRAN) IV, sodium chloride flush, traMADol  DVT prophylaxis: Eliquis  Code Status: DNR Family Communication: no family at bedside   Status is: Inpatient  Remains inpatient appropriate because:Inpatient level of care appropriate due to severity of illness  Dispo: The patient is from: Home              Anticipated d/c is to: Home              Anticipated d/c date is: 3 days              Patient currently is not medically stable to d/c.  Consultants:  Nephrology -signed off 5/16 Cardiology -signed off 5/16, consulted again 5/18 Palliative care  Procedures:  None   Microbiology  None   Antimicrobials: None     Objective: Vitals:   05/28/19 0509 05/28/19 0510 05/28/19 0911 05/28/19 1233  BP: 118/76  118/78 115/68  Pulse: 91  91 85  Resp: 19  20 20   Temp: 99.2 F (37.3 C)   97.9 F (36.6 C)  TempSrc: Oral   Oral  SpO2: 99%  100% 100%  Weight:  68.7 kg    Height:         Intake/Output Summary (Last 24 hours) at 05/28/2019 1322 Last data filed at 05/28/2019 0300 Gross per 24 hour  Intake 470 ml  Output 450 ml  Net 20 ml   Filed Weights   05/24/19 0524 05/27/19 0500 05/28/19 0510  Weight: 68.6 kg 69.3 kg 68.7 kg    Examination:  Constitutional: No distress Eyes: No scleral icterus ENMT: mmm Neck: normal, supple Respiratory: no wheezing, diminished at the bases Cardiovascular: rrr, no murmurs Abdomen: soft, nt, nd, bs+ Musculoskeletal: no clubbing / cyanosis.  Skin: no rashes. Chronic venous changes LE Neurologic: non focal   Data Reviewed: I have independently reviewed following labs and imaging studies   CBC: Recent Labs  Lab 05/21/19 1604 05/22/19 0500 05/23/19 0307 05/24/19 0627 05/25/19 0341 05/26/19 0408 05/27/19 0359  WBC 3.2*   < > 2.9* 5.0 9.3 7.1 5.6  NEUTROABS 2.4  --   --   --   --   --   --   HGB  7.4*   < > 8.1* 7.8* 7.9* 7.7* 8.1*  HCT 24.1*   < > 26.9* 25.7* 26.2* 25.6* 26.6*  MCV 100.8*   < > 101.9* 102.0* 101.6* 101.6* 102.3*  PLT 307   < > 324 271 281 246 242   < > = values in this interval not displayed.   Basic Metabolic Panel: Recent Labs  Lab 05/21/19 2042 05/22/19 0500 05/23/19 0307 05/24/19 0627 05/25/19 0341 05/26/19 0408 05/27/19 0359  NA  --    < > 137 136 137 140 138  K  --    < > 3.4* 3.0* 2.9* 3.8 4.1  CL  --    < > 105 104 104 105 104  CO2  --    < > 22 24 22 26 25   GLUCOSE  --    < > 184* 182* 204* 213* 213*  BUN  --    < > 64* 53* 47* 50* 52*  CREATININE  --    < > 2.81* 2.52* 2.33* 2.59* 2.83*  CALCIUM  --    < > 8.7* 8.3* 8.5* 8.6* 8.9  MG 1.9  --   --   --   --   --   --    < > = values in this interval not displayed.   Liver Function Tests: Recent Labs  Lab 05/21/19 1604 05/22/19 0500 05/23/19 0307  AST 101* 85* 57*  ALT 187* 169* 136*  ALKPHOS 159* 143* 147*  BILITOT 0.6 0.5 0.6  PROT 6.2* 6.4* 6.1*  ALBUMIN 3.2* 3.3* 3.1*   Coagulation Profile: No results for  input(s): INR, PROTIME in the last 168 hours. HbA1C: No results for input(s): HGBA1C in the last 72 hours. CBG: Recent Labs  Lab 05/21/19 1532  GLUCAP 137*    Recent Results (from the past 240 hour(s))  SARS Coronavirus 2 by RT PCR (hospital order, performed in [facility] hospital lab) Nasopharyngeal Nasopharyngeal Swab     Status: None   Collection Time: 05/21/19  5:33 PM   Specimen: Nasopharyngeal Swab  Result Value Ref Range Status   SARS Coronavirus 2 NEGATIVE NEGATIVE Final    Comment: (NOTE) SARS-CoV-2 target nucleic acids are NOT DETECTED. The SARS-CoV-2 RNA is generally detectable in upper and lower respiratory specimens during the acute phase of infection. The lowest concentration of SARS-CoV-2 viral copies this assay can detect is 250 copies / mL. A negative result does not preclude SARS-CoV-2 infection and should not be used as the sole basis for treatment or other patient management decisions.  A negative result may occur with improper specimen collection / handling, submission of specimen other than nasopharyngeal swab, presence of viral mutation(s) within the areas targeted by this assay, and inadequate number of viral copies (<250 copies / mL). A negative result must be combined with clinical observations, patient history, and epidemiological information. Fact Sheet for Patients:   StrictlyIdeas.no Fact Sheet for Healthcare Providers: BankingDealers.co.za This test is not yet approved or cleared  by the [place] FDA and has been authorized for detection and/or diagnosis of SARS-CoV-2 by FDA under an Emergency Use Authorization (EUA).  This EUA will remain in effect (meaning this test can be used) for the duration of the COVID-19 declaration under Section 564(b)(1) of the Act, 21 U.S.C. section 360bbb-3(b)(1), unless the authorization is terminated or revoked sooner. Performed at [facility],  [place] [address] [place], [place] [address]      Radiology Studies: No results found. Zethan Alfieri,  MD, PhD [facility]  Between 7 am - 7 pm I am available, please contact me via Amion or Securechat  Between 7 pm - 7 am I am not available, please contact night coverage MD/APP via Amion

## 2019-05-28 NOTE — Progress Notes (Signed)
   Pharmacist called, Pt is on Eliquis and got a dose this am.  Since pt is for cath, possibly in am, will hold Eliquis and start heparin. Could possibly do as a pm case.   Rosaria Ferries, PA-C 05/28/2019 7:40 PM

## 2019-05-29 ENCOUNTER — Encounter (HOSPITAL_COMMUNITY)
Admission: EM | Disposition: A | Discharge status: Hospice | Payer: Self-pay | Source: Home / Self Care | Attending: Internal Medicine

## 2019-05-29 DIAGNOSIS — I5041 Acute combined systolic (congestive) and diastolic (congestive) heart failure: Secondary | ICD-10-CM

## 2019-05-29 LAB — CBC
HCT: 27.6 % — ABNORMAL LOW (ref 36.0–46.0)
Hemoglobin: 8.3 g/dL — ABNORMAL LOW (ref 12.0–15.0)
MCH: 30.4 pg (ref 26.0–34.0)
MCHC: 30.1 g/dL (ref 30.0–36.0)
MCV: 101.1 fL — ABNORMAL HIGH (ref 80.0–100.0)
Platelets: 267 10*3/uL (ref 150–400)
RBC: 2.73 MIL/uL — ABNORMAL LOW (ref 3.87–5.11)
RDW: 17.9 % — ABNORMAL HIGH (ref 11.5–15.5)
WBC: 4.3 10*3/uL (ref 4.0–10.5)
nRBC: 0 % (ref 0.0–0.2)

## 2019-05-29 LAB — BASIC METABOLIC PANEL
Anion gap: 13 (ref 5–15)
BUN: 60 mg/dL — ABNORMAL HIGH (ref 8–23)
CO2: 23 mmol/L (ref 22–32)
Calcium: 8.6 mg/dL — ABNORMAL LOW (ref 8.9–10.3)
Chloride: 100 mmol/L (ref 98–111)
Creatinine, Ser: 2.95 mg/dL — ABNORMAL HIGH (ref 0.44–1.00)
GFR calc Af Amer: 18 mL/min — ABNORMAL LOW (ref >60)
GFR calc non Af Amer: 15 mL/min — ABNORMAL LOW (ref >60)
Glucose, Bld: 253 mg/dL — ABNORMAL HIGH (ref 70–99)
Potassium: 3.7 mmol/L (ref 3.5–5.1)
Sodium: 136 mmol/L (ref 135–145)

## 2019-05-29 LAB — APTT: aPTT: 56 seconds — ABNORMAL HIGH (ref 24–36)

## 2019-05-29 SURGERY — LEFT HEART CATH AND CORONARY ANGIOGRAPHY
Anesthesia: LOCAL

## 2019-05-29 MED ORDER — ISOSORB DINITRATE-HYDRALAZINE 20-37.5 MG PO TABS
1.0000 | ORAL_TABLET | Freq: Three times a day (TID) | ORAL | Status: DC
  Administered 2019-05-30 – 2019-05-31 (×4): 1 via ORAL
  Filled 2019-05-29 (×6): qty 1

## 2019-05-29 MED ORDER — HEPARIN (PORCINE) 25000 UT/250ML-% IV SOLN
800.0000 [IU]/h | INTRAVENOUS | Status: DC
  Administered 2019-05-29: 800 [IU]/h via INTRAVENOUS
  Filled 2019-05-29: qty 250

## 2019-05-29 MED ORDER — AMOXICILLIN-POT CLAVULANATE 500-125 MG PO TABS
1.0000 | ORAL_TABLET | Freq: Two times a day (BID) | ORAL | Status: DC
  Administered 2019-05-29 – 2019-05-30 (×3): 500 mg via ORAL
  Filled 2019-05-29 (×3): qty 1

## 2019-05-29 MED ORDER — HEPARIN (PORCINE) 25000 UT/250ML-% IV SOLN: 800.0000 [IU]/h | INTRAVENOUS | Status: DC

## 2019-05-29 MED ORDER — APIXABAN 5 MG PO TABS
5.0000 mg | ORAL_TABLET | Freq: Two times a day (BID) | ORAL | Status: DC
  Administered 2019-05-29: 5 mg via ORAL
  Filled 2019-05-29: qty 1

## 2019-05-29 NOTE — Progress Notes (Signed)
PHARMACY NOTE:  ANTIMICROBIAL RENAL DOSAGE ADJUSTMENT  Current antimicrobial regimen includes a mismatch between antimicrobial dosage and estimated renal function.  As per policy approved by the Pharmacy & Therapeutics and Medical Executive Committees, the antimicrobial dosage will be adjusted accordingly.  Current antimicrobial dosage:  Augmentin 875 mg PO BID   Indication: left wrist abscess  Renal Function:  Estimated Creatinine Clearance: 16.4 mL/min (A) (by C-G formula based on SCr of 2.95 mg/dL (H)). []      On intermittent HD, scheduled: []      On CRRT    Antimicrobial dosage has been changed to:  Augmentin 500 mg PO BID   Additional comments:   Thank you for allowing pharmacy to be a part of this patient's care.   Royetta Asal, PharmD, BCPS 05/29/2019 8:23 AM

## 2019-05-29 NOTE — Progress Notes (Addendum)
Hydrologist [facility]) Hospital Liaison: RN note     Notified by [facility] [place], RN of patient/family request for [facility] services at home after discharge on 5/17. Chart and patient information reviewed by [facility] physician. Hospice eligibility confirmed.     After initial consultation, patient opted to have more diagnostic tests completed.  Liaison will continue to follow until family and patient make decision on hospice.   DME was ordered and delivered to the home, walker, 3N1 and oxygen. O2 tank delivered to room for transport home.    [facility] aware of the above. Please notify [facility] when patient is ready to leave the unit at discharge. (Call [phone] or [phone] after 5pm.) [facility] information and contact numbers given to [facility].      Please call with any hospice related questions.      Thank you for this referral.      Farrel Gordon, RN, [facility] (listed on [place] under [employer])   [phone]

## 2019-05-29 NOTE — Progress Notes (Signed)
PROGRESS NOTE    Nicole Sellers  LXB:[id number] DOB: 1950/12/03 DOA: 05/21/2019 PCP: Reynold Bowen, MD    Brief Narrative:  Patient admitted to the hospital with a working diagnosis of cardiorenal syndrome.  Newly diagnosed systolic heart failure.  69 year old female with history of stage III rectal cancer status post chemotherapy, radiation and resection.  She also has hypertension, type 2 diabetes mellitus, hypothyroidism, peripheral vascular disease, deep vein thrombosis and chronic kidney disease stage IV-V. Patient reported progressive, generalized weakness for about 7 days, associated with orthopnea and poor oral intake.  On her initial physical examination blood pressure 128/74, heart rate 84, respiratory 24, oxygen saturation 91%, her lungs are clear to auscultation bilaterally, heart S1-S2, present rhythmic, abdomen soft, ostomy in place, 3+ pitting bilateral extremity edema. Sodium 136, potassium 4.1, chloride 107, bicarb 17, glucose 153, BUN 73, creatinine 3.29. Her chest radiograph had bilateral hilar vascular congestion and small bilateral pleural effusions.  Patient received diuresis with furosemide for volume overload. Further work up with echocardiography showed reduction in EF down to 25% with global hypokinesis.   Initially patient decided to continue care under hospice but then change her opinion.   Unable to proceed with further cardiac workup due to low GFR.   05/17. Patient was diagnosed with left wrist abscess and underwent I&D per orthopedics.   Assessment & Plan:   Principal Problem:   Acute congestive heart failure (HCC) Active Problems:   Non-insulin treated type 2 diabetes mellitus (HCC)   Rectal cancer (HCC)   DVT (deep venous thrombosis) (HCC)   CKD (chronic kidney disease) stage 4, GFR 15-29 ml/min (HCC)   Fluid overload   Anemia of chronic disease   Transaminitis   Palliative care by specialist   Goals of care, counseling/discussion   General  weakness   Stage 5 chronic kidney disease not on chronic dialysis ([place])   1. Acute systolic heart failure complicated with AKI on CKD stage 4 to 5, cardiorenal syndrome/ acute cardiogenic pulmonary edema. Today patient with no significant volume overload, her urine output documented 600 cc over last 24 H, renal function with serum cr at 2.95 with K at 3,7 and serum bicarbonate at 23.  Will continue to hold on diuresis today, will continue heart failure management with metoprolol succinate, not able to use ACE inh due to low GFR. Will add Bidil with close monitoring of blood pressure.   2. Anemia of chronic renal disease/ iron deficiency. Hgb and Hct have been stable, will continue close monitoring. Continue with darbepoetin.   Continue with iron supplementation.   3. DVT. Continue anticoagulation with apixaban.  4. T2DM/ dyslipidemia. . Continue glucose control with Januvia. Continue with atorvastatin.   5. Rectal cancer. Follow as outpatient.   6. Left wrist abscess. Continue antibiotic therapy with Augmentin. Pain is well controlled with tramadol. Continue with local wound care.   7. Hypothyroid. Continue with levothyroxine.   Status is: Inpatient  Remains inpatient appropriate because:Ongoing active pain requiring inpatient pain management. Close monitoring of renal function and inpatient heart failure management.    Dispo: The patient is from: Home              Anticipated d/c is to: Home              Anticipated d/c date is: 2 days              Patient currently is not medically stable to d/c.  DVT prophylaxis: Heparin   Code Status:  dnr   Family Communication:  No family at the bedside      Nutrition Status: Nutrition Problem: Inadequate oral intake Etiology: poor appetite Signs/Symptoms: per patient/family report Interventions: Snacks    Consultants:   Nephrology, Cardiology, Palliative Care   Antimicrobials:   Augmentin     Subjective: Patient  continue to feel very weak and deconditioned, poor oral intake, no nausea or vomiting, her dyspnea is close to her baseline, no chest pain.   Objective: Vitals:   05/29/19 0330 05/29/19 0543 05/29/19 0929 05/29/19 1211  BP:  94/75 105/72 109/66  Pulse:  84 82 75  Resp:  18  16  Temp:  (!) 97.4 F (36.3 C)  97.6 F (36.4 C)  TempSrc:    Oral  SpO2:  99%  100%  Weight: 66.9 kg     Height:        Intake/Output Summary (Last 24 hours) at 05/29/2019 1553 Last data filed at 05/29/2019 1453 Gross per 24 hour  Intake --  Output 1325 ml  Net -1325 ml   Filed Weights   05/27/19 0500 05/28/19 0510 05/29/19 0330  Weight: 69.3 kg 68.7 kg 66.9 kg    Examination:   General: Not in pain or dyspnea, deconditioned  Neurology: Awake and alert, non focal  E ENT: mild pallor, no icterus, oral mucosa moist Cardiovascular: Mild JVD. S1-S2 no S3 or S4 gallop present, rhythmic, no gallops, rubs, or murmurs. Trace lower extremity edema. Pulmonary: positive breath sounds bilaterally, adequate air movement, no wheezing, rhonchi or rales. Gastrointestinal. Abdomen with, no organomegaly, non tender, no rebound or guarding Skin. No rashes Musculoskeletal: no joint deformities     Data Reviewed: I have personally reviewed following labs and imaging studies  CBC: Recent Labs  Lab 05/24/19 0627 05/25/19 0341 05/26/19 0408 05/27/19 0359 05/29/19 0400  WBC 5.0 9.3 7.1 5.6 4.3  HGB 7.8* 7.9* 7.7* 8.1* 8.3*  HCT 25.7* 26.2* 25.6* 26.6* 27.6*  MCV 102.0* 101.6* 101.6* 102.3* 101.1*  PLT 271 281 246 242 035   Basic Metabolic Panel: Recent Labs  Lab 05/24/19 0627 05/25/19 0341 05/26/19 0408 05/27/19 0359 05/29/19 0400  NA 136 137 140 138 136  K 3.0* 2.9* 3.8 4.1 3.7  CL 104 104 105 104 100  CO2 24 22 26 25 23   GLUCOSE 182* 204* 213* 213* 253*  BUN 53* 47* 50* 52* 60*  CREATININE 2.52* 2.33* 2.59* 2.83* 2.95*  CALCIUM 8.3* 8.5* 8.6* 8.9 8.6*   GFR: Estimated Creatinine Clearance:  16.4 mL/min (A) (by C-G formula based on SCr of 2.95 mg/dL (H)). Liver Function Tests: Recent Labs  Lab 05/23/19 0307  AST 57*  ALT 136*  ALKPHOS 147*  BILITOT 0.6  PROT 6.1*  ALBUMIN 3.1*   No results for input(s): LIPASE, AMYLASE in the last 168 hours. No results for input(s): AMMONIA in the last 168 hours. Coagulation Profile: No results for input(s): INR, PROTIME in the last 168 hours. Cardiac Enzymes: No results for input(s): CKTOTAL, CKMB, CKMBINDEX, TROPONINI in the last 168 hours. BNP (last 3 results) No results for input(s): PROBNP in the last 8760 hours. HbA1C: No results for input(s): HGBA1C in the last 72 hours. CBG: No results for input(s): GLUCAP in the last 168 hours. Lipid Profile: No results for input(s): CHOL, HDL, LDLCALC, TRIG, CHOLHDL, LDLDIRECT in the last 72 hours. Thyroid Function Tests: No results for input(s): TSH, T4TOTAL, FREET4, T3FREE, THYROIDAB in the last 72 hours. Anemia Panel: No results for input(s): VITAMINB12, FOLATE, FERRITIN,  TIBC, IRON, RETICCTPCT in the last 72 hours.    Radiology Studies: I have reviewed all of the imaging during this hospital visit personally     Scheduled Meds: . amoxicillin-clavulanate  1 tablet Oral Q12H  . aspirin  81 mg Oral Daily  . atorvastatin  10 mg Oral QHS  . Chlorhexidine Gluconate Cloth  6 each Topical Daily  . darbepoetin (ARANESP) injection - NON-DIALYSIS  100 mcg Subcutaneous Q Wed-1800  . fentaNYL (SUBLIMAZE) injection  12.5 mcg Intravenous Once  . gabapentin  100 mg Oral QHS  . iron polysaccharides  150 mg Oral BID  . levothyroxine  50 mcg Oral Daily  . linagliptin  5 mg Oral Daily  . metoprolol succinate  25 mg Oral Daily  . omeprazole  20 mg Oral Daily  . sodium chloride flush  10-40 mL Intracatheter Q12H  . sodium chloride flush  3 mL Intravenous Q12H   Continuous Infusions: . sodium chloride    . sodium chloride 10 mL/hr at 05/29/19 0533  . ferumoxytol 510 mg (05/29/19 1540)  .  heparin       LOS: 8 days        Hazim Treadway Gerome Apley, MD

## 2019-05-29 NOTE — Progress Notes (Signed)
[place] for IV heparin Indication: PTA Eliquis on hold (Hx DVT)  Allergies  Allergen Reactions  . Kiwi Extract Itching and Other (See Comments)    Throat itching   . Lisinopril Other (See Comments)    Bruising   . Plavix [Clopidogrel] Other (See Comments)    "Made me feel badly- didn't agree with my system"    Patient Measurements: Height: 5\' 6"  (167.6 cm) Weight: 66.9 kg (147 lb 7.8 oz) IBW/kg (Calculated) : 59.3 Heparin Dosing Weight: TBW  Vital Signs: Temp: 97.6 F (36.4 C) (05/19 1211) Temp Source: Oral (05/19 1211) BP: 109/66 (05/19 1211) Pulse Rate: 75 (05/19 1211)  Labs: Recent Labs    05/27/19 0359 05/28/19 2041 05/29/19 0400  HGB 8.1*  --  8.3*  HCT 26.6*  --  27.6*  PLT 242  --  267  APTT  --  39* 56*  HEPARINUNFRC  --  >2.20*  --   CREATININE 2.83*  --  2.95*    Estimated Creatinine Clearance: 16.4 mL/min (A) (by C-G formula based on SCr of 2.95 mg/dL (H)).   Medical History: Past Medical History:  Diagnosis Date  . Age-related nuclear cataract, right eye   . Allergy   . Anemia   . Arthritis    "joints sometimes" (12/25/2012)  . Clotting disorder ([place])   . Colon cancer ([place])   . Critical lower limb ischemia   . Diverticulosis   . Gangrene of toe, Rt second toe 12/25/2012  . GERD (gastroesophageal reflux disease)   . High cholesterol   . Hypertension   . Hypothyroidism   . PAD (peripheral artery disease) ([place])   . PAD (peripheral artery disease) ([place])   . Peripheral neuropathy   . Rectal cancer ([place])   . S/P arterial stent, 12/25/13, successful diamondback orbital rotational arthrectomy, PTA using chocolate  balloon and stenting using I DEV stent of long segment calcified high-grade proximal and mid r 12/25/2012  . Tobacco abuse   . Type II diabetes mellitus (HCC)     Medications:  Medications Prior to Admission  Medication Sig Dispense Refill Last Dose  . albuterol (PROVENTIL HFA;VENTOLIN HFA)  108 (90 BASE) MCG/ACT inhaler Inhale 2 puffs into the lungs every 6 (six) hours as needed for wheezing or shortness of breath.   Past Week at Unknown time  . apixaban (ELIQUIS) 5 MG TABS tablet Take 5 mg by mouth 2 (two) times daily.   05/20/2019 at 1800  . aspirin 81 MG tablet Take 81 mg by mouth daily.   05/20/2019 at Unknown time  . atorvastatin (LIPITOR) 10 MG tablet Take 1 tablet (10 mg total) by mouth daily. (Patient taking differently: Take 10 mg by mouth at bedtime. ) 90 tablet 1 05/20/2019 at Unknown time  . Calcium Carbonate-Vitamin D (CALCIUM 600+D PO) Take 1 tablet by mouth daily.   Past Week at Unknown time  . cetirizine (ZYRTEC) 10 MG tablet Take 10 mg by mouth daily.   Past Week at Unknown time  . Cholecalciferol (VITAMIN D3) 50 MCG (2000 UT) TABS Take 4,000 Units by mouth daily.   Past Week at Unknown time  . FERREX 150 150 MG capsule Take 150 mg by mouth 2 (two) times daily.  11 Past Week at Unknown time  . furosemide (LASIX) 40 MG tablet Take 60 mg by mouth daily.    Past Week at Unknown time  . gabapentin (NEURONTIN) 300 MG capsule Take 300 mg by mouth 2 (two) times  daily.    05/20/2019 at Unknown time  . levothyroxine (SYNTHROID, LEVOTHROID) 50 MCG tablet Take 1 tablet (50 mcg total) by mouth daily. 90 tablet 1 05/20/2019 at Unknown time  . Multiple Vitamins-Minerals (SENIOR MULTIVITAMIN PLUS PO) Take 1 tablet by mouth daily with breakfast.   Past Week at Unknown time  . omeprazole (PRILOSEC OTC) 20 MG tablet Take 20 mg by mouth daily before breakfast.   05/21/2019 at Unknown time  . Psyllium (METAMUCIL FIBER PO) Take 1 Scoop by mouth See admin instructions. Mix 1 scoopful into 6-8 ounces of desired beverage and drink two times a day   Past Week at Unknown time  . sitaGLIPtin (JANUVIA) 25 MG tablet Take 1 tablet (25 mg total) by mouth daily. 30 tablet 0 05/21/2019 at Unknown time  . sodium zirconium cyclosilicate (LOKELMA) 5 g packet Take 5 g by mouth 2 (two) times daily.   05/20/2019 at  Unknown time  . tetrahydrozoline-zinc (VISINE-AC) 0.05-0.25 % ophthalmic solution Place 1 drop into both eyes 2 (two) times daily as needed (dry eyes).   unknown  . traMADol (ULTRAM) 50 MG tablet Take 1 tablet (50 mg total) by mouth every 12 (twelve) hours as needed for moderate pain. 10 tablet 0 Past Week at Unknown time  . Triamcinolone Acetonide (NASACORT ALLERGY 24HR NA) Place 1 spray into both nostrils daily as needed (allergies).    unknown  . Trolamine Salicylate (ASPERCREME EX) Apply 1 application topically daily as needed (to affected areas, for joint pain).    unknown  . vitamin B-12 (CYANOCOBALAMIN) 500 MCG tablet Take 500 mcg by mouth daily.   Past Week at Unknown time  . apixaban (ELIQUIS) 5 MG TABS tablet Take 1 tablet (5 mg total) by mouth 2 (two) times daily. 60 tablet 0    Scheduled:  . amoxicillin-clavulanate  1 tablet Oral Q12H  . aspirin  81 mg Oral Daily  . atorvastatin  10 mg Oral QHS  . Chlorhexidine Gluconate Cloth  6 each Topical Daily  . darbepoetin (ARANESP) injection - NON-DIALYSIS  100 mcg Subcutaneous Q Wed-1800  . fentaNYL (SUBLIMAZE) injection  12.5 mcg Intravenous Once  . gabapentin  100 mg Oral QHS  . iron polysaccharides  150 mg Oral BID  . levothyroxine  50 mcg Oral Daily  . linagliptin  5 mg Oral Daily  . metoprolol succinate  25 mg Oral Daily  . omeprazole  20 mg Oral Daily  . sodium chloride flush  10-40 mL Intracatheter Q12H  . sodium chloride flush  3 mL Intravenous Q12H   Infusions:  . sodium chloride    . sodium chloride 10 mL/hr at 05/29/19 0533  . ferumoxytol 510 mg (05/22/19 1602)    Assessment: 39 yoF with PMH rectal CA in remission, CKD4, DM2, PAD, DVT in 11/2018 (but didn't start Eliquis until 03/2019) admitted for AECHF. Planning for left heart cath so transitioning to heparin periprocedurally   Baseline heparin level, aPTT: pending (anticipate HL will be elevated d/t DOAC, and will likely take longer than usual to return to normal  given CKD4)  Prior anticoagulation: Eliquis 5 mg PO daily, LD 5/18 at 0915  Significant events:  Today, 05/29/2019:  CBC: Hgb low but stable; Plt WNL  This AM aPTT was 56 sec and plan was to increase rate up to 800 un its/hr, but then pt was placed back on apixaban  Pt received one dose of apixaban this AM   Goal of Therapy: Heparin level 0.3-0.7 units/ml Monitor platelets  by anticoagulation protocol: Yes  Plan:  Resume Heparin 12 hours after last dose of apixaban at 800 units/hr , no bolus  Check aPTT 8 hrs after start  Daily CBC and daily heparin level; aPTT as needed while DOAC effects persist  Monitor for signs of bleeding or thrombosis   Royetta Asal, PharmD, BCPS 05/29/2019 2:28 PM

## 2019-05-29 NOTE — Progress Notes (Addendum)
Progress Note  Patient Name: Nicole Sellers Date of Encounter: 05/29/2019  Primary Cardiologist: Quay Burow, MD   Subjective   With creatinine increase to 2.95 will hold on cardiac catheterization. Patient is agreeable to this. Denies chest pain. Breathing stable.   Inpatient Medications    Scheduled Meds: . amoxicillin-clavulanate  1 tablet Oral Q12H  . aspirin  81 mg Oral Daily  . atorvastatin  10 mg Oral QHS  . Chlorhexidine Gluconate Cloth  6 each Topical Daily  . darbepoetin (ARANESP) injection - NON-DIALYSIS  100 mcg Subcutaneous Q Wed-1800  . fentaNYL (SUBLIMAZE) injection  12.5 mcg Intravenous Once  . gabapentin  100 mg Oral QHS  . iron polysaccharides  150 mg Oral BID  . levothyroxine  50 mcg Oral Daily  . linagliptin  5 mg Oral Daily  . megestrol  40 mg Oral Daily  . metoprolol succinate  25 mg Oral Daily  . omeprazole  20 mg Oral Daily  . potassium chloride  40 mEq Oral BID  . sodium chloride flush  10-40 mL Intracatheter Q12H  . sodium chloride flush  3 mL Intravenous Q12H   Continuous Infusions: . sodium chloride    . sodium chloride 10 mL/hr at 05/29/19 0533  . ferumoxytol 510 mg (05/22/19 1602)  . heparin 700 Units/hr (05/28/19 2046)   PRN Meds: sodium chloride, acetaminophen **OR** acetaminophen, albuterol, naphazoline-glycerin, ondansetron **OR** ondansetron (ZOFRAN) IV, sodium chloride flush, sodium chloride flush, traMADol   Vital Signs    Vitals:   05/28/19 1630 05/28/19 2121 05/29/19 0330 05/29/19 0543  BP: 110/78 123/77  94/75  Pulse:  89  84  Resp: 17 18  18   Temp:  98.6 F (37 C)  (!) 97.4 F (36.3 C)  TempSrc:      SpO2: 100% 98%  99%  Weight:   66.9 kg   Height:        Intake/Output Summary (Last 24 hours) at 05/29/2019 0739 Last data filed at 05/29/2019 0330 Gross per 24 hour  Intake 180 ml  Output 1125 ml  Net -945 ml   Last 3 Weights 05/29/2019 05/28/2019 05/27/2019  Weight (lbs) 147 lb 7.8 oz 151 lb 7.3 oz 152 lb 12.5  oz  Weight (kg) 66.9 kg 68.7 kg 69.3 kg      Telemetry    NSR, HR 80s, PVCs - Personally Reviewed  ECG    No new - Personally Reviewed  Physical Exam   GEN: No acute distress.   Neck: No JVD Cardiac: RRR, no murmurs, rubs, or gallops.  Respiratory: minimal crackles at bases GI: Soft, nontender, non-distended  MS: No edema; No deformity. Neuro:  Nonfocal  Psych: Normal affect   Labs    High Sensitivity Troponin:  No results for input(s): TROPONINIHS in the last 720 hours.    Chemistry Recent Labs  Lab 05/23/19 0307 05/24/19 0627 05/26/19 0408 05/27/19 0359 05/29/19 0400  NA 137   < > 140 138 136  K 3.4*   < > 3.8 4.1 3.7  CL 105   < > 105 104 100  CO2 22   < > 26 25 23   GLUCOSE 184*   < > 213* 213* 253*  BUN 64*   < > 50* 52* 60*  CREATININE 2.81*   < > 2.59* 2.83* 2.95*  CALCIUM 8.7*   < > 8.6* 8.9 8.6*  PROT 6.1*  --   --   --   --   ALBUMIN 3.1*  --   --   --   --  AST 57*  --   --   --   --   ALT 136*  --   --   --   --   ALKPHOS 147*  --   --   --   --   BILITOT 0.6  --   --   --   --   GFRNONAA 16*   < > 18* 16* 15*  GFRAA 19*   < > 21* 19* 18*  ANIONGAP 10   < > 9 9 13    < > = values in this interval not displayed.     Hematology Recent Labs  Lab 05/26/19 0408 05/27/19 0359 05/29/19 0400  WBC 7.1 5.6 4.3  RBC 2.52* 2.60* 2.73*  HGB 7.7* 8.1* 8.3*  HCT 25.6* 26.6* 27.6*  MCV 101.6* 102.3* 101.1*  MCH 30.6 31.2 30.4  MCHC 30.1 30.5 30.1  RDW 18.6* 18.7* 17.9*  PLT 246 242 267    BNP Recent Labs  Lab 05/22/19 1633  BNP >4,500.0*     DDimer No results for input(s): DDIMER in the last 168 hours.   Radiology    No results found.  Cardiac Studies   Lexiscan Myoview 06/14/2018:  Nuclear stress EF: 56%.  The left ventricular ejection fraction is normal (55-65%).  There was no ST segment deviation noted during stress.  No T wave inversion was noted during stress.  Defect 1: There is a small defect of mild severity present in  the basal anterior location.  The study is normal.  This is a low risk study.  Low risk stress nuclear study with normal perfusion and normal left ventricular regional and global systolic function. _______________  Echocardiogram 05/22/2019: Impressions: 1. RVEF is moderately decreased.  2. Since the last study on 03/31/2019 there has been a significant change,  LVEF has decreased from 50-55% (with akinesis in the basal and mid  anteroseptal and anterior walls) to 15-20% with severe diffuse hypokinesis  and paradoxical septal motion.  3. There is no thrombus on the left ventricle (on Definity echo contrast  imaging).  4. Left ventricular ejection fraction, by estimation, is 20 to 25%. The  left ventricle has severely decreased function. The left ventricle  demonstrates global hypokinesis. The left ventricular internal cavity size  was moderately dilated. Left  ventricular diastolic parameters are consistent with Grade II diastolic  dysfunction (pseudonormalization). Elevated left atrial pressure. The  average left ventricular global longitudinal strain is -7.0 %.  5. Right ventricular systolic function is moderately reduced. The right  ventricular size is moderately enlarged. There is mildly elevated  pulmonary artery systolic pressure. The estimated right ventricular  systolic pressure is 76.1 mmHg.  6. Left atrial size was moderately dilated.  7. Right atrial size was mildly dilated.  8. The mitral valve is normal in structure. Moderate mitral valve  regurgitation. No evidence of mitral stenosis.  9. Tricuspid valve regurgitation is moderate.  10. The aortic valve is normal in structure. Aortic valve regurgitation is  mild. Mild to moderate aortic valve sclerosis/calcification is present,  without any evidence of aortic stenosis.  11. The inferior vena cava is normal in size with greater than 50%  respiratory variability, suggesting right atrial pressure of 3 mmHg.    Patient Profile     69 y.o. 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 Weight down 6lbs since admission - continue metoprolol 25mg  daily - No ACE/ARB/ARNI given renal function - patient declined heart cath at first given risk of dialysis but after discussion with family she reconsidered. Spoke to patient yesterday and cath was scheduled however creatinine is 2.95 today (up from yesterday 2.83) and will hold on cardiac cath. Will evaluate tomorrow for possible cardiac cath.  PAD - s/p PTA/stent to the right SFA in 2014 ultimately undergoing 2nd toe amputation - s/p PTA/stent to right external iliac artery in 04/2017 - Recent dopplers 03/2019 showed patent right SFA stent and 30-40% stenosis of left common femoral artery as well as occlusion of the right peroneal artery, left SFA with reconstitution in the distal segment - B/L ABIs and TBIs decreased from 2019 - continue aspirin and statin - follows with Dr. Gwenlyn Found  Carotid artery stenosis - mild RICA and moderate L ICA 06/2018 - aspirin and statin  HTN -  continue BB  HLD - LDL 53 - continue atorvastatin  DVT - diagnosed on 11/2018 but a/c not started due to loss of follow-up and subsequently started on Eliquis 03/2019 - held last night for cath and was started on IV heparin.   CKD stage 4-5 - creatinine on admission 3.29 - Creatinine trend 2.33>2.59>2.83 - patient is not interested in dialysis  For questions or updates, please contact [place] Please consult [email or web address] for contact info under     Signed, Cadence Ninfa Meeker, PA-C  05/29/2019, 7:39 AM    The patient was seen, examined and discussed with Cadence Ninfa Meeker, PA-C   and I agree with the above.   Patient's creatinine increased today from 2.83-2.95, he has been holding torsemide since yesterday, the patient continues to have good diuresis, will continue to hold, plan for cardiac catheterization for tomorrow.  We need to discontinue Eliquis and restart heparin.  Ena Dawley, MD 05/29/2019

## 2019-05-29 NOTE — Progress Notes (Signed)
[place] for IV heparin Indication: PTA Eliquis on hold (Hx DVT)  Allergies  Allergen Reactions  . Kiwi Extract Itching and Other (See Comments)    Throat itching   . Lisinopril Other (See Comments)    Bruising   . Plavix [Clopidogrel] Other (See Comments)    "Made me feel badly- didn't agree with my system"    Patient Measurements: Height: 5\' 6"  (167.6 cm) Weight: 66.9 kg (147 lb 7.8 oz) IBW/kg (Calculated) : 59.3 Heparin Dosing Weight: TBW  Vital Signs: Temp: 97.6 F (36.4 C) (05/19 1211) Temp Source: Oral (05/19 1211) BP: 109/66 (05/19 1211) Pulse Rate: 75 (05/19 1211)  Labs: Recent Labs    05/27/19 0359 05/28/19 2041 05/29/19 0400  HGB 8.1*  --  8.3*  HCT 26.6*  --  27.6*  PLT 242  --  267  APTT  --  39* 56*  HEPARINUNFRC  --  >2.20*  --   CREATININE 2.83*  --  2.95*    Estimated Creatinine Clearance: 16.4 mL/min (A) (by C-G formula based on SCr of 2.95 mg/dL (H)).   Medical History: Past Medical History:  Diagnosis Date  . Age-related nuclear cataract, right eye   . Allergy   . Anemia   . Arthritis    "joints sometimes" (12/25/2012)  . Clotting disorder ([place])   . Colon cancer ([place])   . Critical lower limb ischemia   . Diverticulosis   . Gangrene of toe, Rt second toe 12/25/2012  . GERD (gastroesophageal reflux disease)   . High cholesterol   . Hypertension   . Hypothyroidism   . PAD (peripheral artery disease) ([place])   . PAD (peripheral artery disease) ([place])   . Peripheral neuropathy   . Rectal cancer ([place])   . S/P arterial stent, 12/25/13, successful diamondback orbital rotational arthrectomy, PTA using chocolate  balloon and stenting using I DEV stent of long segment calcified high-grade proximal and mid r 12/25/2012  . Tobacco abuse   . Type II diabetes mellitus (HCC)     Medications:  Medications Prior to Admission  Medication Sig Dispense Refill Last Dose  . albuterol (PROVENTIL HFA;VENTOLIN HFA)  108 (90 BASE) MCG/ACT inhaler Inhale 2 puffs into the lungs every 6 (six) hours as needed for wheezing or shortness of breath.   Past Week at Unknown time  . apixaban (ELIQUIS) 5 MG TABS tablet Take 5 mg by mouth 2 (two) times daily.   05/20/2019 at 1800  . aspirin 81 MG tablet Take 81 mg by mouth daily.   05/20/2019 at Unknown time  . atorvastatin (LIPITOR) 10 MG tablet Take 1 tablet (10 mg total) by mouth daily. (Patient taking differently: Take 10 mg by mouth at bedtime. ) 90 tablet 1 05/20/2019 at Unknown time  . Calcium Carbonate-Vitamin D (CALCIUM 600+D PO) Take 1 tablet by mouth daily.   Past Week at Unknown time  . cetirizine (ZYRTEC) 10 MG tablet Take 10 mg by mouth daily.   Past Week at Unknown time  . Cholecalciferol (VITAMIN D3) 50 MCG (2000 UT) TABS Take 4,000 Units by mouth daily.   Past Week at Unknown time  . FERREX 150 150 MG capsule Take 150 mg by mouth 2 (two) times daily.  11 Past Week at Unknown time  . furosemide (LASIX) 40 MG tablet Take 60 mg by mouth daily.    Past Week at Unknown time  . gabapentin (NEURONTIN) 300 MG capsule Take 300 mg by mouth 2 (two) times  daily.    05/20/2019 at Unknown time  . levothyroxine (SYNTHROID, LEVOTHROID) 50 MCG tablet Take 1 tablet (50 mcg total) by mouth daily. 90 tablet 1 05/20/2019 at Unknown time  . Multiple Vitamins-Minerals (SENIOR MULTIVITAMIN PLUS PO) Take 1 tablet by mouth daily with breakfast.   Past Week at Unknown time  . omeprazole (PRILOSEC OTC) 20 MG tablet Take 20 mg by mouth daily before breakfast.   05/21/2019 at Unknown time  . Psyllium (METAMUCIL FIBER PO) Take 1 Scoop by mouth See admin instructions. Mix 1 scoopful into 6-8 ounces of desired beverage and drink two times a day   Past Week at Unknown time  . sitaGLIPtin (JANUVIA) 25 MG tablet Take 1 tablet (25 mg total) by mouth daily. 30 tablet 0 05/21/2019 at Unknown time  . sodium zirconium cyclosilicate (LOKELMA) 5 g packet Take 5 g by mouth 2 (two) times daily.   05/20/2019 at  Unknown time  . tetrahydrozoline-zinc (VISINE-AC) 0.05-0.25 % ophthalmic solution Place 1 drop into both eyes 2 (two) times daily as needed (dry eyes).   unknown  . traMADol (ULTRAM) 50 MG tablet Take 1 tablet (50 mg total) by mouth every 12 (twelve) hours as needed for moderate pain. 10 tablet 0 Past Week at Unknown time  . Triamcinolone Acetonide (NASACORT ALLERGY 24HR NA) Place 1 spray into both nostrils daily as needed (allergies).    unknown  . Trolamine Salicylate (ASPERCREME EX) Apply 1 application topically daily as needed (to affected areas, for joint pain).    unknown  . vitamin B-12 (CYANOCOBALAMIN) 500 MCG tablet Take 500 mcg by mouth daily.   Past Week at Unknown time  . apixaban (ELIQUIS) 5 MG TABS tablet Take 1 tablet (5 mg total) by mouth 2 (two) times daily. 60 tablet 0    Scheduled:  . amoxicillin-clavulanate  1 tablet Oral Q12H  . aspirin  81 mg Oral Daily  . atorvastatin  10 mg Oral QHS  . Chlorhexidine Gluconate Cloth  6 each Topical Daily  . darbepoetin (ARANESP) injection - NON-DIALYSIS  100 mcg Subcutaneous Q Wed-1800  . fentaNYL (SUBLIMAZE) injection  12.5 mcg Intravenous Once  . gabapentin  100 mg Oral QHS  . iron polysaccharides  150 mg Oral BID  . levothyroxine  50 mcg Oral Daily  . linagliptin  5 mg Oral Daily  . metoprolol succinate  25 mg Oral Daily  . omeprazole  20 mg Oral Daily  . sodium chloride flush  10-40 mL Intracatheter Q12H  . sodium chloride flush  3 mL Intravenous Q12H   Infusions:  . sodium chloride    . sodium chloride 10 mL/hr at 05/29/19 0533  . ferumoxytol 510 mg (05/22/19 1602)  . heparin      Assessment: 16 yoF with PMH rectal CA in remission, CKD4, DM2, PAD, DVT in 11/2018 (but didn't start Eliquis until 03/2019) admitted for AECHF. Planning for left heart cath so transitioning to heparin periprocedurally   Baseline heparin level, aPTT: pending (anticipate HL will be elevated d/t DOAC, and will likely take longer than usual to  return to normal given CKD4)  Prior anticoagulation: Eliquis 5 mg PO daily, LD 5/18 at 0915  Significant events:  Today, 05/29/2019:  CBC: Hgb low but stable; Plt WNL  This AM aPTT was 56 sec and plan was to increase rate up to 800 un its/hr, but then pt was placed back on apixaban  Pt received one dose of apixaban this AM   Goal of Therapy: Heparin  level 0.3-0.7 units/ml Monitor platelets by anticoagulation protocol: Yes  Plan:  Resume Heparin 12 hours after last dose of apixaban at 800 units/hr , no bolus  Check aPTT 8 hrs after start  Daily CBC   Monitor for signs of bleeding or thrombosis   Royetta Asal, PharmD, BCPS 05/29/2019 2:43 PM

## 2019-05-30 ENCOUNTER — Encounter (HOSPITAL_COMMUNITY)
Admission: EM | Disposition: A | Discharge status: Hospice | Payer: Self-pay | Source: Home / Self Care | Attending: Internal Medicine

## 2019-05-30 ENCOUNTER — Encounter (HOSPITAL_COMMUNITY): Payer: Medicare Other

## 2019-05-30 LAB — CBC
HCT: 28.4 % — ABNORMAL LOW (ref 36.0–46.0)
Hemoglobin: 8.6 g/dL — ABNORMAL LOW (ref 12.0–15.0)
MCH: 30.6 pg (ref 26.0–34.0)
MCHC: 30.3 g/dL (ref 30.0–36.0)
MCV: 101.1 fL — ABNORMAL HIGH (ref 80.0–100.0)
Platelets: 280 10*3/uL (ref 150–400)
RBC: 2.81 MIL/uL — ABNORMAL LOW (ref 3.87–5.11)
RDW: 17.6 % — ABNORMAL HIGH (ref 11.5–15.5)
WBC: 4.1 10*3/uL (ref 4.0–10.5)
nRBC: 0 % (ref 0.0–0.2)

## 2019-05-30 LAB — BASIC METABOLIC PANEL
Anion gap: 12 (ref 5–15)
BUN: 57 mg/dL — ABNORMAL HIGH (ref 8–23)
CO2: 23 mmol/L (ref 22–32)
Calcium: 8.5 mg/dL — ABNORMAL LOW (ref 8.9–10.3)
Chloride: 101 mmol/L (ref 98–111)
Creatinine, Ser: 2.7 mg/dL — ABNORMAL HIGH (ref 0.44–1.00)
GFR calc Af Amer: 20 mL/min — ABNORMAL LOW (ref >60)
GFR calc non Af Amer: 17 mL/min — ABNORMAL LOW (ref >60)
Glucose, Bld: 209 mg/dL — ABNORMAL HIGH (ref 70–99)
Potassium: 3.4 mmol/L — ABNORMAL LOW (ref 3.5–5.1)
Sodium: 136 mmol/L (ref 135–145)

## 2019-05-30 LAB — HEPARIN LEVEL (UNFRACTIONATED): Heparin Unfractionated: >2.2 IU/mL — ABNORMAL HIGH (ref 0.30–0.70)

## 2019-05-30 LAB — APTT
aPTT: 122 seconds — ABNORMAL HIGH (ref 24–36)
aPTT: 115 seconds — ABNORMAL HIGH (ref 24–36)

## 2019-05-30 SURGERY — LEFT HEART CATH AND CORONARY ANGIOGRAPHY
Anesthesia: LOCAL

## 2019-05-30 MED ORDER — ISOSORB DINITRATE-HYDRALAZINE 20-37.5 MG PO TABS: 1.0000 | ORAL_TABLET | Freq: Three times a day (TID) | ORAL | 0 refills | Status: DC

## 2019-05-30 MED ORDER — POTASSIUM CHLORIDE CRYS ER 20 MEQ PO TBCR
40.0000 meq | EXTENDED_RELEASE_TABLET | Freq: Once | ORAL | Status: AC
  Administered 2019-05-30: 40 meq via ORAL
  Filled 2019-05-30: qty 2

## 2019-05-30 MED ORDER — CEPHALEXIN 500 MG PO CAPS
500.0000 mg | ORAL_CAPSULE | Freq: Two times a day (BID) | ORAL | Status: DC
  Administered 2019-05-30 – 2019-05-31 (×2): 500 mg via ORAL
  Filled 2019-05-30 (×2): qty 1

## 2019-05-30 MED ORDER — METOPROLOL SUCCINATE ER 25 MG PO TB24
12.5000 mg | ORAL_TABLET | Freq: Every day | ORAL | Status: DC
  Administered 2019-05-31: 12.5 mg via ORAL
  Filled 2019-05-30: qty 1

## 2019-05-30 MED ORDER — CEPHALEXIN 500 MG PO CAPS: 500.0000 mg | ORAL_CAPSULE | Freq: Two times a day (BID) | ORAL | 0 refills | Status: AC

## 2019-05-30 MED ORDER — METOPROLOL SUCCINATE ER 25 MG PO TB24: 12.5000 mg | ORAL_TABLET | Freq: Every day | ORAL | 0 refills | Status: DC

## 2019-05-30 MED ORDER — APIXABAN 5 MG PO TABS
5.0000 mg | ORAL_TABLET | Freq: Two times a day (BID) | ORAL | Status: DC
  Administered 2019-05-30 – 2019-05-31 (×3): 5 mg via ORAL
  Filled 2019-05-30 (×3): qty 1

## 2019-05-30 MED ORDER — TORSEMIDE 20 MG PO TABS: 60.0000 mg | ORAL_TABLET | Freq: Two times a day (BID) | ORAL | 0 refills | Status: DC

## 2019-05-30 MED ORDER — TORSEMIDE 20 MG PO TABS
60.0000 mg | ORAL_TABLET | Freq: Two times a day (BID) | ORAL | Status: DC
  Administered 2019-05-30 – 2019-05-31 (×2): 60 mg via ORAL
  Filled 2019-05-30 (×3): qty 3

## 2019-05-30 NOTE — Progress Notes (Addendum)
Progress Note  Patient Name: Nicole Sellers Date of Encounter: 05/30/2019  Primary Cardiologist: Quay Burow, MD   Subjective   Creatinine is 2.7. Plan was for cardiac cath today however patient decided she does not to go through with it. Denies chest pain. Feels a little short of breath.   Inpatient Medications    Scheduled Meds: . amoxicillin-clavulanate  1 tablet Oral Q12H  . aspirin  81 mg Oral Daily  . atorvastatin  10 mg Oral QHS  . Chlorhexidine Gluconate Cloth  6 each Topical Daily  . darbepoetin (ARANESP) injection - NON-DIALYSIS  100 mcg Subcutaneous Q Wed-1800  . fentaNYL (SUBLIMAZE) injection  12.5 mcg Intravenous Once  . gabapentin  100 mg Oral QHS  . iron polysaccharides  150 mg Oral BID  . isosorbide-hydrALAZINE  1 tablet Oral TID  . levothyroxine  50 mcg Oral Daily  . linagliptin  5 mg Oral Daily  . metoprolol succinate  25 mg Oral Daily  . omeprazole  20 mg Oral Daily  . sodium chloride flush  10-40 mL Intracatheter Q12H  . sodium chloride flush  3 mL Intravenous Q12H   Continuous Infusions: . sodium chloride    . sodium chloride 10 mL/hr at 05/29/19 0533  . heparin 800 Units/hr (05/29/19 2220)   PRN Meds: sodium chloride, acetaminophen **OR** acetaminophen, albuterol, naphazoline-glycerin, ondansetron **OR** ondansetron (ZOFRAN) IV, sodium chloride flush, sodium chloride flush, traMADol   Vital Signs    Vitals:   05/29/19 1211 05/29/19 2120 05/30/19 0423 05/30/19 0620  BP: 109/66 111/67 97/61   Pulse: 75 73 76   Resp: 16 16    Temp: 97.6 F (36.4 C) 97.9 F (36.6 C) (!) 97.5 F (36.4 C)   TempSrc: Oral Oral Oral   SpO2: 100% 100% 100%   Weight:    67.4 kg  Height:        Intake/Output Summary (Last 24 hours) at 05/30/2019 0808 Last data filed at 05/30/2019 0600 Gross per 24 hour  Intake 674.17 ml  Output 1200 ml  Net -525.83 ml   Last 3 Weights 05/30/2019 05/29/2019 05/28/2019  Weight (lbs) 148 lb 9.4 oz 147 lb 7.8 oz 151 lb 7.3 oz   Weight (kg) 67.4 kg 66.9 kg 68.7 kg      Telemetry    NSR, HR in the 70s, 3 beats NSVT - Personally Reviewed  ECG    No new - Personally Reviewed  Physical Exam   GEN: No acute distress.   Neck: No JVD Cardiac: RRR, no murmurs, rubs, or gallops.  Respiratory: Clear to auscultation bilaterally. GI: Soft, nontender, non-distended  MS: No edema; No deformity. Neuro:  Nonfocal  Psych: Normal affect   Labs    High Sensitivity Troponin:  No results for input(s): TROPONINIHS in the last 720 hours.    Chemistry Recent Labs  Lab 05/27/19 0359 05/29/19 0400 05/30/19 0355  NA 138 136 136  K 4.1 3.7 3.4*  CL 104 100 101  CO2 25 23 23   GLUCOSE 213* 253* 209*  BUN 52* 60* 57*  CREATININE 2.83* 2.95* 2.70*  CALCIUM 8.9 8.6* 8.5*  GFRNONAA 16* 15* 17*  GFRAA 19* 18* 20*  ANIONGAP 9 13 12      Hematology Recent Labs  Lab 05/27/19 0359 05/29/19 0400 05/30/19 0355  WBC 5.6 4.3 4.1  RBC 2.60* 2.73* 2.81*  HGB 8.1* 8.3* 8.6*  HCT 26.6* 27.6* 28.4*  MCV 102.3* 101.1* 101.1*  MCH 31.2 30.4 30.6  MCHC 30.5 30.1 30.3  RDW 18.7* 17.9* 17.6*  PLT 242 267 280    BNPNo results for input(s): BNP, PROBNP in the last 168 hours.   DDimer No results for input(s): DDIMER in the last 168 hours.   Radiology    No results found.  Cardiac Studies   Lexiscan Myoview 06/14/2018:  Nuclear stress EF: 56%.  The left ventricular ejection fraction is normal (55-65%).  There was no ST segment deviation noted during stress.  No T wave inversion was noted during stress.  Defect 1: There is a small defect of mild severity present in the basal anterior location.  The study is normal.  This is a low risk study.  Low risk stress nuclear study with normal perfusion and normal left ventricular regional and global systolic function. _______________  Echocardiogram 05/22/2019: Impressions: 1. RVEF is moderately decreased.  2. Since the last study on 03/31/2019 there has been a  significant change,  LVEF has decreased from 50-55% (with akinesis in the basal and mid  anteroseptal and anterior walls) to 15-20% with severe diffuse hypokinesis  and paradoxical septal motion.  3. There is no thrombus on the left ventricle (on Definity echo contrast  imaging).  4. Left ventricular ejection fraction, by estimation, is 20 to 25%. The  left ventricle has severely decreased function. The left ventricle  demonstrates global hypokinesis. The left ventricular internal cavity size  was moderately dilated. Left  ventricular diastolic parameters are consistent with Grade II diastolic  dysfunction (pseudonormalization). Elevated left atrial pressure. The  average left ventricular global longitudinal strain is -7.0 %.  5. Right ventricular systolic function is moderately reduced. The right  ventricular size is moderately enlarged. There is mildly elevated  pulmonary artery systolic pressure. The estimated right ventricular  systolic pressure is 30.8 mmHg.  6. Left atrial size was moderately dilated.  7. Right atrial size was mildly dilated.  8. The mitral valve is normal in structure. Moderate mitral valve  regurgitation. No evidence of mitral stenosis.  9. Tricuspid valve regurgitation is moderate.  10. The aortic valve is normal in structure. Aortic valve regurgitation is  mild. Mild to moderate aortic valve sclerosis/calcification is present,  without any evidence of aortic stenosis.  11. The inferior vena cava is normal in size with greater than 50%  respiratory variability, suggesting right atrial pressure of 3 mmHg.    Patient Profile     69 y.o. 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 Weight down 9lbs since admission - continue metoprolol 25mg  daily - No ACE/ARB/ARNI given renal function -  Bidil added - patient declined heart cath at firstgiven risk of dialysisbut after discussion with family she reconsidered. Cath was held yesterday for creatinine of 2.95. Today creatinine is 2.70 and could pursue however patient decided to not go through with it. Says she spoke to her family about her decision.   PAD - s/p PTA/stent to the right SFA in 2014 ultimately undergoing 2nd toe amputation - s/p PTA/stent to right external iliac artery in 04/2017 - Recent dopplers 03/2019 showed patent right SFA stent and 30-40% stenosis of left common femoral artery as well as occlusion of the right peroneal artery, left SFA with reconstitution in the distal segment - B/L ABIs and TBIs decreased from 2019 - continue aspirin and statin - follows with Dr. Gwenlyn Found  Carotid artery stenosis - mild RICA and moderate L ICA 06/2018 - aspirin and statin  HTN - continue BB  HLD - LDL 53 - continue atorvastatin  DVT - diagnosed on 11/2018 but a/c not started due to loss of follow-up and subsequently started on Eliquis 03/2019 - Eliquis held for cath and on IV heparin. cn restart Eliquis since no plan for cardiac cath  CKD stage 4-5 -  creatinine on admission 3.29 - Creatinine trend 2.83>2.95>2.70 - patient is not interested in dialysis - torsemide held for cath  For questions or updates, please contact [place] Please consult [email or web address] for contact info under     Signed, Cadence Ninfa Meeker, PA-C  05/30/2019, 8:08 AM    The patient was seen, examined and discussed with Cadence Ninfa Meeker, PA-C   and I agree with the above.   The patient decided again not to proceed invasive management and does not want to undergo cardiac cath, we will cancel, we will restart her Eliquis as well as torsemide.  We will sign off, please call us with any questions.  Ena Dawley, MD 05/30/2019

## 2019-05-30 NOTE — Plan of Care (Signed)
  Problem: Education: Goal: Knowledge of General Education information will improve Description Including pain rating scale, medication(s)/side effects and non-pharmacologic comfort measures Outcome: Progressing   

## 2019-05-30 NOTE — Progress Notes (Signed)
OT Cancellation Note  Patient Details Name: ASHELYNN MARKS MRN: [id number] DOB: 14-Oct-1950   Cancelled Treatment:    Reason Eval/Treat Not Completed: Other (comment)(Patinet declined therapy this a.m. reporting she hadn't ate yet and was waiting on breakfast. Will f/u when able.)  [place] 05/30/2019, 4:49 PM

## 2019-05-30 NOTE — TOC Progression Note (Signed)
Transition of Care [facility]) - Progression Note    Patient Details  Name: Nicole Sellers MRN: [id number] Date of Birth: [birth year]  [facility] CM/SW Contact  Purcell Mouton, RN Phone Number: 05/30/2019, 4:11 PM  Clinical Narrative:    Pt is very [place] that she is going home with Hospice.    Expected Discharge Plan: Home w Hospice Care Barriers to Discharge: Continued Medical Work up  Expected Discharge Plan and Services Expected Discharge Plan: [place]   Discharge Planning Services: CM Consult   Living arrangements for the past 2 months: Single Family Home Expected Discharge Date: 05/30/19                         HH Arranged: RN [facility] Agency: Hospice and [place] Date [place]: 05/27/19 Time [place]: [place] Representative spoke with at [place]: [place] Determinants of Health (SDOH) Interventions    Readmission Risk Interventions Readmission Risk Prevention Plan 05/22/2019 05/22/2019 10/02/2018  Transportation Screening - Complete Complete  Medication Review Press photographer) - Complete Complete  PCP or Specialist appointment within 3-5 days of discharge - Complete Not Complete  PCP/Specialist Appt Not Complete comments - - not ready to dc  HRI or [place] - Complete Complete  SW Recovery Care/Counseling Consult - Complete Complete  SW Consult Not Complete Comments - - -  Palliative Care Screening Complete Not Applicable Not Applicable  Skilled Nursing Facility - Not Applicable Not Applicable  Some recent data might be hidden

## 2019-05-30 NOTE — Progress Notes (Signed)
Subjective:   Procedure(s) (LRB): S/p bedside I&D left wrist.  States she is doing well.  Some soreness of wrist.   Objective: Vital signs in last 24 hours: Temp:  [97.5 F (36.4 C)-97.9 F (36.6 C)] 97.9 F (36.6 C) (05/20 1354) Pulse Rate:  [73-79] 79 (05/20 1354) Resp:  [16-17] 17 (05/20 1354) BP: (97-111)/(61-68) 110/68 (05/20 1354) SpO2:  [98 %-100 %] 98 % (05/20 1354) Weight:  [67.4 kg] 67.4 kg (05/20 0620)  Intake/Output from previous day: 05/19 0701 - 05/20 0700 In: 674.2 [P.O.:300; I.V.:274.2; IV Piggyback:100] Out: 1200 [Urine:800; Stool:400] Intake/Output this shift: Total I/O In: 30 [P.O.:30] Out: 225 [Urine:200; Stool:25]  Recent Labs    05/29/19 0400 05/30/19 0355  HGB 8.3* 8.6*   Recent Labs    05/29/19 0400 05/30/19 0355  WBC 4.3 4.1  RBC 2.73* 2.81*  HCT 27.6* 28.4*  PLT 267 280   Recent Labs    05/29/19 0400 05/30/19 0355  NA 136 136  K 3.7 3.4*  CL 100 101  CO2 23 23  BUN 60* 57*  CREATININE 2.95* 2.70*  GLUCOSE 253* 209*  CALCIUM 8.6* 8.5*   No results for input(s): LABPT, INR in the last 72 hours.  Intact sensation and capillary refill all digits.  Moving all digits.  Wound without erythema or purulence.   Assessment/Plan:   Procedure(s) (LRB): LEFT HEART CATH AND CORONARY ANGIOGRAPHY (N/A) S/p left wrist bedside I&D.  Local wound care.  Antibiotics x 1 week.  F/u in office 1 week if necessary.   Nicole Sellers 05/30/2019, 4:19 PM

## 2019-05-30 NOTE — Discharge Summary (Signed)
Physician Discharge Summary  Nicole Sellers PPI:[id number] DOB: 06/26/1950 DOA: 05/21/2019  PCP: Reynold Bowen, MD  Admit date: 05/21/2019 Discharge date: 05/30/2019  Admitted From: Home  Disposition: Home   Recommendations for Outpatient Follow-up and new medication changes:  1. Follow up with Dr. Forde Dandy in 7 days.  2. Patient has been placed on torsemide, Bidil and metoprolol succinate.  3. Continue with Cephalexin 500 mg bid for 5 days.  4. Patient is being discharge with hospice services.   Home Health: no   Equipment/Devices: na    Discharge Condition: stable  CODE STATUS: DNR   Diet recommendation: heart healthy and diabetic prudent.   Brief/Interim Summary: Patient admitted to the hospital with a working diagnosis of cardiorenal syndrome.  Newly diagnosed systolic heart failure.  69 year old female with history of stage III rectal cancer status post chemotherapy, radiation and resection.  She also has hypertension, type 2 diabetes mellitus, hypothyroidism, peripheral vascular disease, deep vein thrombosis and chronic kidney disease stage IV-V. Patient reported progressive, generalized weakness for about 7 days, associated with orthopnea and poor oral intake.  On her initial physical examination blood pressure 128/74, heart rate 84, respiratory rate 24, oxygen saturation 91%, her lungs were clear to auscultation bilaterally, heart S1-S2, present rhythmic, abdomen soft, ostomy in place, 3+ pitting bilateral lower extremity edema. Sodium 136, potassium 4.1, chloride 107, bicarb 17, glucose 153, BUN 73, creatinine 3.29.  White cell count 3.2, hemoglobin 7.4, hematocrit 24.1, platelets 307.  SARS COVID-19 negative. Her chest radiograph had bilateral hilar vascular congestion and small bilateral pleural effusions.  CT chest with signs of pulmonary edema, CT abdomen negative for acute changes.   EKG 86 bpm, normal axis, prolonged QTC 536, left bundle branch block, sinus rhythm, poor R  wave progression, lateral ST depressions with negative T waves.    Patient received diuresis with furosemide for volume overload. Further work up with echocardiography showed reduction in EF down to 25% with global hypokinesis.   Initially patient decided to continue care under hospice but then change her opinion.   Unable to proceed with further cardiac workup due to low GFR.   05/17. Patient was diagnosed with left wrist abscess and underwent I&D per orthopedics.    Finally patient decided to go home with hospice services.  Continue medical management for heart failure.  1.  Acute on chronic systolic heart failure, complicated with acute kidney injury on chronic kidney disease stage IV-V, acute on chronic cardiorenal syndrome, acute cardiogenic pulmonary edema and acute hypoxic respiratory failure.  Patient was admitted to the medical ward, she received aggressive diuresis with intravenous furosemide, with improvement of her symptoms and volume status, she achieved negative fluid balance, -5972 mL. At discharge her oxymetry is 100% on 1 L/ min per [place] of supplemental 02.   Further work-up with echocardiography showed a significant reduction on her LV systolic function down to 20 to 25% with severe diffuse global hypokinesis.  RV systolic function was moderately reduced.  Patient declined further ischemic work-up, patient will continue with afterload reduction with hydralazine, isosorbide, long-acting beta blockade with metoprolol succinate.  Continue diuresis with torsemide 60 mg twice daily.  2.  Acute kidney injury on chronic kidney disease stage IV/V.  Patient received diuresis with good toleration, her kidney function has remained stable over the last 72 hours, serum creatinine 2.7-2.8.  Her potassium is 3.4 and bicarb 23.  Patient will be discharged on high dose of torsemide, will discontinue sodium zirconium, will need follow-up of kidney  function in 7 days.  3.  Left wrist abscess.   Patient underwent I&D by orthopedics, culture positive for MSSA, patient will be discharged to continue oral antibiotic therapy and continue pain control.  4.  Anemia chronic renal disease, iron deficiency.  Patient's cell count remained stable, continue darbepoetin, follow-up as an outpatient.  5.  Type II diabetes mellitus (Hgb A1c is 5.4), dyslipidemia.  Continue glucose control with Januvia.  Continue atorvastatin.  6.  Rectal cancer.  Status post resection, ostomy in place, follow-up as an outpatient.  7.  Hypothyroidism.  Continue levothyroxine.  8. Hx of DVT. Continue anticoagulation with apixaban.  Type 2 diabetes mellitus with   Discharge Diagnoses:  Principal Problem:   Acute congestive heart failure (HCC) Active Problems:   Non-insulin treated type 2 diabetes mellitus (HCC)   Rectal cancer (HCC)   DVT (deep venous thrombosis) (HCC)   CKD (chronic kidney disease) stage 4, GFR 15-29 ml/min (HCC)   Fluid overload   Anemia of chronic disease   Transaminitis   Palliative care by specialist   Goals of care, counseling/discussion   General weakness   Stage 5 chronic kidney disease not on chronic dialysis [facility])    Discharge Instructions   Allergies as of 05/30/2019      Reactions   Kiwi Extract Itching, Other (See Comments)   Throat itching    Lisinopril Other (See Comments)   Bruising   Plavix [clopidogrel] Other (See Comments)   "Made me feel badly- didn't agree with my system"      Medication List    STOP taking these medications   furosemide 40 MG tablet Commonly known as: LASIX   Lokelma 5 g packet Generic drug: sodium zirconium cyclosilicate     TAKE these medications   albuterol 108 (90 Base) MCG/ACT inhaler Commonly known as: VENTOLIN HFA Inhale 2 puffs into the lungs every 6 (six) hours as needed for wheezing or shortness of breath.   ASPERCREME EX Apply 1 application topically daily as needed (to affected areas, for joint pain).   aspirin 81  MG tablet Take 81 mg by mouth daily.   atorvastatin 10 MG tablet Commonly known as: LIPITOR Take 1 tablet (10 mg total) by mouth daily. What changed: when to take this   CALCIUM 600+D PO Take 1 tablet by mouth daily.   cephALEXin 500 MG capsule Commonly known as: KEFLEX Take 1 capsule (500 mg total) by mouth every 12 (twelve) hours for 5 days.   cetirizine 10 MG tablet Commonly known as: ZYRTEC Take 10 mg by mouth daily.   Eliquis 5 MG Tabs tablet Generic drug: apixaban Take 5 mg by mouth 2 (two) times daily. What changed: Another medication with the same name was removed. Continue taking this medication, and follow the directions you see here.   Ferrex 150 150 MG capsule Generic drug: iron polysaccharides Take 150 mg by mouth 2 (two) times daily.   gabapentin 300 MG capsule Commonly known as: NEURONTIN Take 300 mg by mouth 2 (two) times daily.   isosorbide-hydrALAZINE 20-37.5 MG tablet Commonly known as: BIDIL Take 1 tablet by mouth 3 (three) times daily.   levothyroxine 50 MCG tablet Commonly known as: SYNTHROID Take 1 tablet (50 mcg total) by mouth daily.   METAMUCIL FIBER PO Take 1 Scoop by mouth See admin instructions. Mix 1 scoopful into 6-8 ounces of desired beverage and drink two times a day   metoprolol succinate 25 MG 24 hr tablet Commonly known as: TOPROL-XL Take  0.5 tablets (12.5 mg total) by mouth daily. Start taking on: May 31, 2019   NASACORT ALLERGY 24HR NA Place 1 spray into both nostrils daily as needed (allergies).   omeprazole 20 MG tablet Commonly known as: PRILOSEC OTC Take 20 mg by mouth daily before breakfast.   SENIOR MULTIVITAMIN PLUS PO Take 1 tablet by mouth daily with breakfast.   sitaGLIPtin 25 MG tablet Commonly known as: Januvia Take 1 tablet (25 mg total) by mouth daily.   tetrahydrozoline-zinc 0.05-0.25 % ophthalmic solution Commonly known as: VISINE-AC Place 1 drop into both eyes 2 (two) times daily as needed (dry  eyes).   torsemide 20 MG tablet Commonly known as: DEMADEX Take 3 tablets (60 mg total) by mouth 2 (two) times daily.   traMADol 50 MG tablet Commonly known as: ULTRAM Take 1 tablet (50 mg total) by mouth every 12 (twelve) hours as needed for moderate pain.   vitamin B-12 500 MCG tablet Commonly known as: CYANOCOBALAMIN Take 500 mcg by mouth daily.   Vitamin D3 50 MCG (2000 UT) Tabs Take 4,000 Units by mouth daily.      Follow-up Information    [facility].   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 Kiwi Extract Itching and Other (See Comments)    Throat itching   . Lisinopril Other (See Comments)    Bruising   . Plavix [Clopidogrel] Other (See Comments)    "Made me feel badly- didn't agree with my system"    Consultations:  Cardiology   Nephrology    Procedures/Studies: CT ABDOMEN PELVIS WO CONTRAST  Result Date: 05/21/2019 CLINICAL DATA:  Pleural effusions, jaundice and weakness for 3 days. EXAM: CT CHEST, ABDOMEN AND PELVIS WITHOUT CONTRAST TECHNIQUE: Multidetector CT imaging of the chest, abdomen and pelvis was performed following the standard protocol without IV contrast. COMPARISON:  CT abdomen and pelvis 03/30/2019. FINDINGS: CT CHEST FINDINGS Cardiovascular: Cardiomegaly. Calcific aortic and coronary atherosclerosis. No pericardial effusion. No aortic aneurysm. Mediastinum/Nodes: No enlarged mediastinal, hilar, or axillary lymph nodes. Thyroid gland, trachea, and esophagus demonstrate no significant findings. Lungs/Pleura: Small bilateral pleural effusions. There is interlobular septal thickening and scattered foci of mild ground-glass attenuation. No consolidative process, nodule or mass. Mild compressive atelectasis in the bases noted. Musculoskeletal: No acute bony abnormality. CT ABDOMEN  PELVIS FINDINGS Hepatobiliary: No focal liver abnormality is seen. No gallstones, gallbladder wall thickening, or biliary dilatation. Pancreas: Unremarkable. No pancreatic ductal dilatation or surrounding inflammatory changes. Spleen: Normal in size without focal abnormality. Adrenals/Urinary Tract: The right kidney appears normal. 0.6 cm calcification upper pole left kidney is compatible with a nonobstructing stone. Ureters and urinary bladder appear normal. Stomach/Bowel: The patient is status post low anterior resection. 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 There is some sigmoid diverticulosis. The appendix appears normal. No evidence of bowel obstruction. Stomach is unremarkable Vascular/Lymphatic: Aortic atherosclerosis. No enlarged abdominal or pelvic lymph nodes. Reproductive: Status post hysterectomy. No adnexal masses. Other: None. Musculoskeletal: No acute or focal abnormality. IMPRESSION: Interlobular septal thickening in the chest consistent with pulmonary edema. Associated small bilateral pleural effusions and cardiomegaly noted. No acute abnormality abdomen or pelvis. Status post low anterior resection with an ileostomy in place. Mild diverticulosis without diverticulitis noted. Aortic Atherosclerosis (ICD10-I70.0). Calcific coronary artery disease is also seen. Electronically Signed   By: Inge Rise M.D.   On: 05/21/2019 18:59  DG Chest 2 View  Result Date: 05/21/2019 CLINICAL DATA:  Weakness x3 days. EXAM: CHEST - 2 VIEW COMPARISON:  March 30, 2019 FINDINGS: There is stable right-sided venous Port-A-Cath positioning. Mild to moderate severity diffusely increased lung markings are seen which is increased in severity when compared to the prior study. Mild areas of atelectasis and/or infiltrate are seen within the bilateral lung bases. There are small bilateral pleural effusions. No pneumothorax is identified. The cardiac silhouette is markedly enlarged. This is increased in size when compared to the prior  study. The visualized skeletal structures are unremarkable. IMPRESSION: 1. Findings consistent with mild to moderate congestive heart failure. 2. Mild bibasilar atelectasis and/or infiltrate. 3. Small bilateral pleural effusions. 4. Interval enlargement of the cardiac silhouette since the prior chest plain film dated March 30, 2019. The presence of a pericardial effusion cannot be excluded. Electronically Signed   By: Virgina Norfolk M.D.   On: 05/21/2019 18:16   CT Chest Wo Contrast  Result Date: 05/21/2019 CLINICAL DATA:  Pleural effusions, jaundice and weakness for 3 days. EXAM: CT CHEST, ABDOMEN AND PELVIS WITHOUT CONTRAST TECHNIQUE: Multidetector CT imaging of the chest, abdomen and pelvis was performed following the standard protocol without IV contrast. COMPARISON:  CT abdomen and pelvis 03/30/2019. FINDINGS: CT CHEST FINDINGS Cardiovascular: Cardiomegaly. Calcific aortic and coronary atherosclerosis. No pericardial effusion. No aortic aneurysm. Mediastinum/Nodes: No enlarged mediastinal, hilar, or axillary lymph nodes. Thyroid gland, trachea, and esophagus demonstrate no significant findings. Lungs/Pleura: Small bilateral pleural effusions. There is interlobular septal thickening and scattered foci of mild ground-glass attenuation. No consolidative process, nodule or mass. Mild compressive atelectasis in the bases noted. Musculoskeletal: No acute bony abnormality. CT ABDOMEN PELVIS FINDINGS Hepatobiliary: No focal liver abnormality is seen. No gallstones, gallbladder wall thickening, or biliary dilatation. Pancreas: Unremarkable. No pancreatic ductal dilatation or surrounding inflammatory changes. Spleen: Normal in size without focal abnormality. Adrenals/Urinary Tract: The right kidney appears normal. 0.6 cm calcification upper pole left kidney is compatible with a nonobstructing stone. Ureters and urinary bladder appear normal. Stomach/Bowel: The patient is status post low anterior resection.  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 There is some sigmoid diverticulosis. The appendix appears normal. No evidence of bowel obstruction. Stomach is unremarkable Vascular/Lymphatic: Aortic atherosclerosis. No enlarged abdominal or pelvic lymph nodes. Reproductive: Status post hysterectomy. No adnexal masses. Other: None. Musculoskeletal: No acute or focal abnormality. IMPRESSION: Interlobular septal thickening in the chest consistent with pulmonary edema. Associated small bilateral pleural effusions and cardiomegaly noted. No acute abnormality abdomen or pelvis. Status post low anterior resection with an ileostomy in place. Mild diverticulosis without diverticulitis noted. Aortic Atherosclerosis (ICD10-I70.0). Calcific coronary artery disease is also seen. Electronically Signed   By: Inge Rise M.D.   On: 05/21/2019 18:59   ECHOCARDIOGRAM COMPLETE  Result Date: 05/22/2019    ECHOCARDIOGRAM REPORT   Patient Name:   AALIJAH MIMS Date of Exam: 05/22/2019 Medical Rec #:  [id number]        Height:       66.5 in Accession #:    [id number]       Weight:       154.5 lb Date of Birth:  1950-05-31        BSA:          1.802 m Patient Age:    80 years         BP:           123/73 mmHg Patient Gender: F  HR:           84 bpm. Exam Location:  Inpatient Procedure: 2D Echo, 3D Echo, Color Doppler, Cardiac Doppler and Strain Analysis Indications:    F02.77 Acute diastolic (congestive) heart failure  History:        Patient has prior history of Echocardiogram examinations, most                 recent 03/31/2019. Risk Factors:Hypertension, Diabetes and                 Dyslipidemia.  Sonographer:    Raquel Sarna Senior RDCS Referring Phys: [phone] JARED M GARDNER  Sonographer Comments: Patient scanned sitting up due to dyspnea. IMPRESSIONS  1. RVEF is moderately decreased.  2. Since the last study on 03/31/2019 there has been a significant change, LVEF has decreased from 50-55% (with akinesis in the basal and mid anteroseptal and  anterior walls) to 15-20% with severe diffuse hypokinesis and paradoxical septal motion.  3. There is no thrombus on the left ventricle (on Definity echo contrast imaging).  4. Left ventricular ejection fraction, by estimation, is 20 to 25%. The left ventricle has severely decreased function. The left ventricle demonstrates global hypokinesis. The left ventricular internal cavity size was moderately dilated. Left ventricular diastolic parameters are consistent with Grade II diastolic dysfunction (pseudonormalization). Elevated left atrial pressure. The average left ventricular global longitudinal strain is -7.0 %.  5. Right ventricular systolic function is moderately reduced. The right ventricular size is moderately enlarged. There is mildly elevated pulmonary artery systolic pressure. The estimated right ventricular systolic pressure is 78.6 mmHg.  6. Left atrial size was moderately dilated.  7. Right atrial size was mildly dilated.  8. The mitral valve is normal in structure. Moderate mitral valve regurgitation. No evidence of mitral stenosis.  9. Tricuspid valve regurgitation is moderate. 10. The aortic valve is normal in structure. Aortic valve regurgitation is mild. Mild to moderate aortic valve sclerosis/calcification is present, without any evidence of aortic stenosis. 11. The inferior vena cava is normal in size with greater than 50% respiratory variability, suggesting right atrial pressure of 3 mmHg. FINDINGS  Left Ventricle: Left ventricular ejection fraction, by estimation, is 20 to 25%. The left ventricle has severely decreased function. The left ventricle demonstrates global hypokinesis. The average left ventricular global longitudinal strain is -7.0 %. The left ventricular internal cavity size was moderately dilated. There is no left ventricular hypertrophy. Abnormal (paradoxical) septal motion, consistent with left bundle branch block. Left ventricular diastolic parameters are consistent with Grade II   diastolic dysfunction (pseudonormalization). Elevated left atrial pressure. Right Ventricle: The right ventricular size is moderately enlarged. No increase in right ventricular wall thickness. Right ventricular systolic function is moderately reduced. There is mildly elevated pulmonary artery systolic pressure. The tricuspid regurgitant velocity is 2.61 m/s, and with an assumed right atrial pressure of 15 mmHg, the estimated right ventricular systolic pressure is 76.7 mmHg. Left Atrium: Left atrial size was moderately dilated. Right Atrium: Right atrial size was mildly dilated. Pericardium: A small pericardial effusion is present. The pericardial effusion is posterior and lateral to the left ventricle. There is no evidence of cardiac tamponade. Mitral Valve: The mitral valve is normal in structure. Normal mobility of the mitral valve leaflets. Moderate mitral valve regurgitation, with centrally-directed jet. No evidence of mitral valve stenosis. Tricuspid Valve: The tricuspid valve is normal in structure. Tricuspid valve regurgitation is moderate . No evidence of tricuspid stenosis. Aortic Valve: The aortic valve is normal in  structure.. There is moderate thickening and mild calcification of the aortic valve. Aortic valve regurgitation is mild. Mild to moderate aortic valve sclerosis/calcification is present, without any evidence of  aortic stenosis. There is moderate thickening of the aortic valve. There is mild calcification of the aortic valve. Pulmonic Valve: The pulmonic valve was normal in structure. Pulmonic valve regurgitation is not visualized. No evidence of pulmonic stenosis. Aorta: The aortic root is normal in size and structure. Venous: The inferior vena cava is normal in size with greater than 50% respiratory variability, suggesting right atrial pressure of 3 mmHg. IAS/Shunts: No atrial level shunt detected by color flow Doppler.  LEFT VENTRICLE PLAX 2D LVIDd:         4.60 cm  Diastology LVIDs:          4.20 cm  LV e' lateral:   7.29 cm/s LV PW:         1.00 cm  LV E/e' lateral: 11.6 LV IVS:        0.80 cm  LV e' medial:    3.70 cm/s LVOT diam:     1.90 cm  LV E/e' medial:  22.8 LV SV:         39 LV SV Index:   22       2D Longitudinal Strain LVOT Area:     2.84 cm 2D Strain GLS Avg:     -7.0 %  RIGHT VENTRICLE RV S prime:     9.57 cm/s TAPSE (M-mode): 1.8 cm LEFT ATRIUM             Index       RIGHT ATRIUM           Index LA diam:        4.30 cm 2.39 cm/m  RA Area:     18.45 cm LA Vol (A2C):   93.4 ml 51.82 ml/m RA Volume:   50.20 ml  27.85 ml/m LA Vol (A4C):   84.1 ml 46.63 ml/m LA Biplane Vol: 85.7 ml 47.55 ml/m  AORTIC VALVE LVOT Vmax:   77.00 cm/s LVOT Vmean:  45.900 cm/s LVOT VTI:    0.139 m  AORTA Ao Root diam: 2.90 cm Ao Asc diam:  2.60 cm MITRAL VALVE               TRICUSPID VALVE MV Area (PHT): 5.23 cm    TR Peak grad:   27.2 mmHg MV Decel Time: 145 msec    TR Vmax:        261.00 cm/s MV E velocity: 84.40 cm/s MV A velocity: 89.10 cm/s  SHUNTS MV E/A ratio:  0.95        Systemic VTI:  0.14 m                            Systemic Diam: 1.90 cm Ena Dawley MD Electronically signed by Ena Dawley MD Signature Date/Time: 05/22/2019/1:09:35 PM    Final       Procedures: left wrist abscess I&D  Subjective: Patient is feeling better, no nausea or vomiting, dyspnea has improved, no chest pain,.   Discharge Exam: Vitals:   05/29/19 2120 05/30/19 0423  BP: 111/67 97/61  Pulse: 73 76  Resp: 16   Temp: 97.9 F (36.6 C) (!) 97.5 F (36.4 C)  SpO2: 100% 100%   Vitals:   05/29/19 1211 05/29/19 2120 05/30/19 0423 05/30/19 0620  BP: 109/66 111/67 97/61   Pulse: 75 73  76   Resp: 16 16    Temp: 97.6 F (36.4 C) 97.9 F (36.6 C) (!) 97.5 F (36.4 C)   TempSrc: Oral Oral Oral   SpO2: 100% 100% 100%   Weight:    67.4 kg  Height:        General: Not in pain or dyspnea. Neurology: Awake and alert, non focal  E ENT: no pallor, no icterus, oral mucosa moist Cardiovascular: No  JVD. S1-S2 present, rhythmic, no gallops, rubs, or murmurs. Trace lower extremity edema. Pulmonary: vesicular breath sounds bilaterally, adequate air movement, no wheezing, rhonchi or rales. Gastrointestinal. Abdomen with no organomegaly, non tender, no rebound or guarding Skin. Left wrist dressing in place.  Musculoskeletal: no joint deformities   The results of significant diagnostics from this hospitalization (including imaging, microbiology, ancillary and laboratory) are listed below for reference.     Microbiology: Recent Results (from the past 240 hour(s))  SARS Coronavirus 2 by RT PCR (hospital order, performed in [facility] hospital lab) Nasopharyngeal Nasopharyngeal Swab     Status: None   Collection Time: 05/21/19  5:33 PM   Specimen: Nasopharyngeal Swab  Result Value Ref Range Status   SARS Coronavirus 2 NEGATIVE NEGATIVE Final    Comment: (NOTE) SARS-CoV-2 target nucleic acids are NOT DETECTED. The SARS-CoV-2 RNA is generally detectable in upper and lower respiratory specimens during the acute phase of infection. The lowest concentration of SARS-CoV-2 viral copies this assay can detect is 250 copies / mL. A negative result does not preclude SARS-CoV-2 infection and should not be used as the sole basis for treatment or other patient management decisions.  A negative result may occur with improper specimen collection / handling, submission of specimen other than nasopharyngeal swab, presence of viral mutation(s) within the areas targeted by this assay, and inadequate number of viral copies (<250 copies / mL). A negative result must be combined with clinical observations, patient history, and epidemiological information. Fact Sheet for Patients:   StrictlyIdeas.no Fact Sheet for Healthcare Providers: BankingDealers.co.za This test is not yet approved or cleared  by the [place] FDA and has been authorized for detection  and/or diagnosis of SARS-CoV-2 by FDA under an Emergency Use Authorization (EUA).  This EUA will remain in effect (meaning this test can be used) for the duration of the COVID-19 declaration under Section 564(b)(1) of the Act, 21 U.S.C. section 360bbb-3(b)(1), unless the authorization is terminated or revoked sooner. Performed at [facility], [place] [address], [place], [place] [address]   Aerobic Culture (superficial specimen)     Status: None (Preliminary result)   Collection Time: 05/28/19 12:25 PM   Specimen: Abscess; Wound  Result Value Ref Range Status   Specimen Description   Final    ABSCESS LEFT WRIST Performed at [facility] [address] [place], [place] [address]    Special Requests   Final    NONE Performed at [facility], [place] [address], [place], [place] [address]    Gram Stain   Final    ABUNDANT WBC PRESENT,BOTH PMN AND MONONUCLEAR ABUNDANT GRAM POSITIVE COCCI    Culture   Final    MODERATE STAPHYLOCOCCUS AUREUS FEW ENTEROCOCCUS FAECALIS SUSCEPTIBILITIES TO FOLLOW Performed at [facility], [place] [address], [place],  [address]    Report Status PENDING  Incomplete   Organism ID, Bacteria STAPHYLOCOCCUS AUREUS  Final      Susceptibility   Staphylococcus aureus - MIC*    CIPROFLOXACIN >=8  RESISTANT Resistant     ERYTHROMYCIN <=0.25 SENSITIVE Sensitive     GENTAMICIN <=0.5 SENSITIVE Sensitive     OXACILLIN <=0.25 SENSITIVE Sensitive     TETRACYCLINE >=16 RESISTANT Resistant     VANCOMYCIN <=0.5 SENSITIVE Sensitive     TRIMETH/SULFA <=10 SENSITIVE Sensitive     CLINDAMYCIN <=0.25 SENSITIVE Sensitive     RIFAMPIN <=0.5 SENSITIVE Sensitive     Inducible Clindamycin NEGATIVE Sensitive     * MODERATE STAPHYLOCOCCUS AUREUS     Labs: BNP (last 3 results) Recent Labs    05/22/19 1633  BNP >1,027.2*   Basic Metabolic Panel: Recent Labs  Lab 05/25/19 0341 05/26/19 0408 05/27/19 0359  05/29/19 0400 05/30/19 0355  NA 137 140 138 136 136  K 2.9* 3.8 4.1 3.7 3.4*  CL 104 105 104 100 101  CO2 22 26 25 23 23   GLUCOSE 204* 213* 213* 253* 209*  BUN 47* 50* 52* 60* 57*  CREATININE 2.33* 2.59* 2.83* 2.95* 2.70*  CALCIUM 8.5* 8.6* 8.9 8.6* 8.5*   Liver Function Tests: No results for input(s): AST, ALT, ALKPHOS, BILITOT, PROT, ALBUMIN in the last 168 hours. No results for input(s): LIPASE, AMYLASE in the last 168 hours. No results for input(s): AMMONIA in the last 168 hours. CBC: Recent Labs  Lab 05/25/19 0341 05/26/19 0408 05/27/19 0359 05/29/19 0400 05/30/19 0355  WBC 9.3 7.1 5.6 4.3 4.1  HGB 7.9* 7.7* 8.1* 8.3* 8.6*  HCT 26.2* 25.6* 26.6* 27.6* 28.4*  MCV 101.6* 101.6* 102.3* 101.1* 101.1*  PLT 281 246 242 267 280   Cardiac Enzymes: No results for input(s): CKTOTAL, CKMB, CKMBINDEX, TROPONINI in the last 168 hours. BNP: Invalid input(s): POCBNP CBG: No results for input(s): GLUCAP in the last 168 hours. D-Dimer No results for input(s): DDIMER in the last 72 hours. Hgb A1c No results for input(s): HGBA1C in the last 72 hours. Lipid Profile No results for input(s): CHOL, HDL, LDLCALC, TRIG, CHOLHDL, LDLDIRECT in the last 72 hours. Thyroid function studies No results for input(s): TSH, T4TOTAL, T3FREE, THYROIDAB in the last 72 hours.  Invalid input(s): FREET3 Anemia work up No results for input(s): VITAMINB12, FOLATE, FERRITIN, TIBC, IRON, RETICCTPCT in the last 72 hours. Urinalysis    Component Value Date/Time   COLORURINE YELLOW 03/30/2019 0759   APPEARANCEUR HAZY (A) 03/30/2019 0759   LABSPEC 1.011 03/30/2019 0759   PHURINE 5.0 03/30/2019 0759   GLUCOSEU NEGATIVE 03/30/2019 0759   HGBUR NEGATIVE 03/30/2019 0759   BILIRUBINUR NEGATIVE 03/30/2019 0759   BILIRUBINUR negative 11/27/2014 1052   KETONESUR NEGATIVE 03/30/2019 0759   PROTEINUR NEGATIVE 03/30/2019 0759   UROBILINOGEN 0.2 11/27/2014 1052   NITRITE NEGATIVE 03/30/2019 0759    LEUKOCYTESUR MODERATE (A) 03/30/2019 0759   Sepsis Labs Invalid input(s): PROCALCITONIN,  WBC,  LACTICIDVEN Microbiology Recent Results (from the past 240 hour(s))  SARS Coronavirus 2 by RT PCR (hospital order, performed in [facility] hospital lab) Nasopharyngeal Nasopharyngeal Swab     Status: None   Collection Time: 05/21/19  5:33 PM   Specimen: Nasopharyngeal Swab  Result Value Ref Range Status   SARS Coronavirus 2 NEGATIVE NEGATIVE Final    Comment: (NOTE) SARS-CoV-2 target nucleic acids are NOT DETECTED. The SARS-CoV-2 RNA is generally detectable in upper and lower respiratory specimens during the acute phase of infection. The lowest concentration of SARS-CoV-2 viral copies this assay can detect is 250 copies / mL. A negative result does not preclude SARS-CoV-2 infection and should not be used as the sole basis for  treatment or other patient management decisions.  A negative result may occur with improper specimen collection / handling, submission of specimen other than nasopharyngeal swab, presence of viral mutation(s) within the areas targeted by this assay, and inadequate number of viral copies (<250 copies / mL). A negative result must be combined with clinical observations, patient history, and epidemiological information. Fact Sheet for Patients:   StrictlyIdeas.no Fact Sheet for Healthcare Providers: BankingDealers.co.za This test is not yet approved or cleared  by the [place] FDA and has been authorized for detection and/or diagnosis of SARS-CoV-2 by FDA under an Emergency Use Authorization (EUA).  This EUA will remain in effect (meaning this test can be used) for the duration of the COVID-19 declaration under Section 564(b)(1) of the Act, 21 U.S.C. section 360bbb-3(b)(1), unless the authorization is terminated or revoked sooner. Performed at [facility], [place] [address] [place], [place]  [address]   Aerobic Culture (superficial specimen)     Status: None (Preliminary result)   Collection Time: 05/28/19 12:25 PM   Specimen: Abscess; Wound  Result Value Ref Range Status   Specimen Description   Final    ABSCESS LEFT WRIST Performed at [facility] [address], [place], [place] [address]    Special Requests   Final    NONE Performed at [facility], [place] [address], [place], [place] [address]    Gram Stain   Final    ABUNDANT WBC PRESENT,BOTH PMN AND MONONUCLEAR ABUNDANT GRAM POSITIVE COCCI    Culture   Final    MODERATE STAPHYLOCOCCUS AUREUS FEW ENTEROCOCCUS FAECALIS SUSCEPTIBILITIES TO FOLLOW Performed at [facility], [place] [address] [place], [place] [address]    Report Status PENDING  Incomplete   Organism ID, Bacteria STAPHYLOCOCCUS AUREUS  Final      Susceptibility   Staphylococcus aureus - MIC*    CIPROFLOXACIN >=8 RESISTANT Resistant     ERYTHROMYCIN <=0.25 SENSITIVE Sensitive     GENTAMICIN <=0.5 SENSITIVE Sensitive     OXACILLIN <=0.25 SENSITIVE Sensitive     TETRACYCLINE >=16 RESISTANT Resistant     VANCOMYCIN <=0.5 SENSITIVE Sensitive     TRIMETH/SULFA <=10 SENSITIVE Sensitive     CLINDAMYCIN <=0.25 SENSITIVE Sensitive     RIFAMPIN <=0.5 SENSITIVE Sensitive     Inducible Clindamycin NEGATIVE Sensitive     * MODERATE STAPHYLOCOCCUS AUREUS     Time coordinating discharge: 45 minutes  SIGNED:   Tawni Millers, MD  [facility] 05/30/2019, 1:00 PM

## 2019-05-30 NOTE — Care Management Important Message (Signed)
Important Message  Patient Details IM Letter given to [name] RN Case Manager to present to the Patient Name: Nicole Sellers MRN: [id number] Date of Birth: Aug 30, 1950   Medicare Important Message Given:  Yes     Kerin Salen 05/30/2019, 10:49 AM

## 2019-05-30 NOTE — Progress Notes (Signed)
Pt refusing Cardiac Cath procedure, Cardiology PA made aware, diet reordered. SRP, RN

## 2019-05-30 NOTE — Plan of Care (Signed)
  Problem: Clinical Measurements: Goal: Will remain free from infection Outcome: Progressing   Problem: Nutrition: Goal: Adequate nutrition will be maintained Outcome: Progressing   Problem: Safety: Goal: Ability to remain free from injury will improve Outcome: Progressing   Problem: Pain Managment: Goal: General experience of comfort will improve Outcome: Progressing   Problem: Skin Integrity: Goal: Risk for impaired skin integrity will decrease Outcome: Progressing

## 2019-05-30 NOTE — TOC Progression Note (Signed)
Transition of Care [facility]) - Progression Note    Patient Details  Name: TAISA DELORIA MRN: [id number] Date of Birth: 06-04-1950  Transition of Care [facility] CM/SW Contact  Purcell Mouton, RN Phone Number: 05/30/2019, 10:39 AM  Clinical Narrative:     Spoke with pt concerning discharge plan. Pt states that she want [facility] and that her son will pick her up from hospital.   Expected Discharge Plan: Home w Hospice Care Barriers to Discharge: Continued Medical Work up  Expected Discharge Plan and Services Expected Discharge Plan: [place]   Discharge Planning Services: CM Consult   Living arrangements for the past 2 months: Single Family Home                           HH Arranged: RN [facility] Agency: Hospice and [place] Date [place]: 05/27/19 Time [place]: [place] Representative spoke with at [place]: [place] Determinants of Health ([place]) Interventions    Readmission Risk Interventions Readmission Risk Prevention Plan 05/22/2019 05/22/2019 10/02/2018  Transportation Screening - Complete Complete  Medication Review Press photographer) - Complete Complete  PCP or Specialist appointment within 3-5 days of discharge - Complete Not Complete  PCP/Specialist Appt Not Complete comments - - not ready to dc  HRI or Home Care Consult - Complete Complete  SW Recovery Care/Counseling Consult - Complete Complete  SW Consult Not Complete Comments - - -  Palliative Care Screening Complete Not Applicable Not Applicable  Skilled Nursing Facility - Not Applicable Not Applicable  Some recent data might be hidden

## 2019-05-30 NOTE — Progress Notes (Signed)
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  Pt admitted for working diagnosis of cardiorenal syndrome.  Newly diagnosed systolic heart failure.    PT Comments    Although pt reports she did not want to move, she was eventually agreeable to stand at EOB today.  Discussed d/c plans with pt, recommending SNF however pt declines SNF at this time.  Pt states she wants to go home.  Pt with poor insight regarding mobility as she believes she will "be okay" at home when questioning ability to get to kitchen, food, go to bathroom on her own at home even though she has not ambulated during admission.  Pt continues to reports desire to d/c home.  Recommending SNF currently, if pt becomes agreeable.    Follow Up Recommendations  Supervision/Assistance - 24 hour;SNF     Equipment Recommendations  None recommended by PT    Recommendations for Other Services       Precautions / Restrictions Precautions Precautions: Fall    Mobility  Bed Mobility Overal bed mobility: Needs Assistance Bed Mobility: Supine to Sit     Supine to sit: Supervision;HOB elevated     General bed mobility comments: increased time and effort (at least 5 minutes to get to EOB)  Transfers Overall transfer level: Needs assistance Equipment used: Straight cane Transfers: Sit to/from Stand Sit to Stand: Min guard         General transfer comment: min/guard for safety, increased time to rise, slow transitions  Ambulation/Gait              General Gait Details: pt declined despite encouragement   Stairs             Wheelchair Mobility    Modified Rankin (Stroke Patients Only)       Balance Overall balance assessment: Needs assistance Sitting-balance support: No upper extremity supported;Feet supported Sitting balance-Leahy Scale: Good     Standing balance support: Single extremity supported Standing balance-Leahy Scale: Poor Standing balance comment: requires UE support                            Cognition Arousal/Alertness: Awake/alert Behavior During Therapy: WFL for tasks assessed/performed Overall Cognitive Status: Within Functional Limits for tasks assessed                                        Exercises      General Comments        Pertinent Vitals/Pain Pain Assessment: No/denies pain    Home Living                      Prior Function            PT Goals (current goals can now be found in the care plan section) Progress towards PT goals: Progressing toward goals    Frequency    Min 2X/week      PT Plan  Discharge plan needs to be updated    Co-evaluation              AM-PAC PT "6 Clicks" Mobility   Outcome Measure  Help needed turning from your back to your side while in a flat bed without using bedrails?: A Little Help needed moving from lying on your back to sitting on the side of a flat bed without using bedrails?: A Little   Help needed standing up from a chair using your arms (e.g., wheelchair or bedside chair)?: A Little Help needed to walk in hospital room?: A Lot Help needed climbing 3-5 steps with a railing? : A Lot 6 Click Score: 13    End of Session   Activity Tolerance: Patient limited by fatigue Patient left: in bed;with call bell/phone within reach(sitting EOB per pt request)   PT Visit Diagnosis: Muscle weakness (generalized) (M62.81);Other abnormalities of gait and mobility (R26.89)     Time:  1433-1450 PT Time Calculation (min) (ACUTE ONLY): 17 min  Charges:  $Therapeutic Activity: 8-22 mins                     Arlyce Dice, DPT Acute Rehabilitation Services Office: [phone]  Trena Platt 05/30/2019, 4:23 PM

## 2019-05-30 NOTE — Consult Note (Signed)
[place] Nurse ostomy consult note Stoma type/location: RLQ ileostomy Stomal assessment/size: 1 inch round, red, raised Peristomal assessment: Did not see. Pouch is intact Treatment options for stomal/peristomal skin: None indicated Output: Brown liquid Ostomy pouching: 1pc.convex pouch.  Education provided: None. Patient is independent at home. Enrolled patient in [place] program: Yes, previously   [place] Nurse Consult Note: Reason for Consult:Wound care to left wrist.   Wound type:s/p I&D  Dr. Fredna Dow saw today and ordered twice daily wet to dry dressings. Nursing consulted me to clarify orders as there were existing orders on the chart from Ortho PA on 5/18.  I have reinforced the need to follow the most recent order from Dr. Fredna Dow.   [place] nursing team will not follow, but will remain available to this patient, the nursing and medical teams.  Please re-consult if needed. Thanks, Maudie Flakes, MSN, RN, [place], Arther Abbott  Pager# [phone]

## 2019-05-31 LAB — AEROBIC CULTURE W GRAM STAIN (SUPERFICIAL SPECIMEN)

## 2019-05-31 MED ORDER — HEPARIN SOD (PORK) LOCK FLUSH 100 UNIT/ML IV SOLN
500.0000 [IU] | INTRAVENOUS | Status: AC | PRN
  Administered 2019-05-31: 500 [IU]

## 2019-05-31 NOTE — Progress Notes (Signed)
OT Cancellation Note  Patient Details Name: Nicole Sellers MRN: [id number] DOB: 12-17-1950   Cancelled Treatment:    Reason Eval/Treat Not Completed: Patient declined, no reason specified. Reports she "doesn't feel like it right now."  Nicole Sellers L Nicole Sellers 05/31/2019, 1:24 PM

## 2019-05-31 NOTE — Progress Notes (Signed)
Patient medically stable for discharge, she has decided to go home with hospice services.

## 2019-05-31 NOTE — Plan of Care (Signed)
  Problem: Clinical Measurements: °Goal: Respiratory complications will improve °Outcome: Adequate for Discharge °  °Problem: Clinical Measurements: °Goal: Ability to maintain clinical measurements within normal limits will improve °Outcome: Adequate for Discharge °  °

## 2019-05-31 NOTE — TOC Progression Note (Signed)
Transition of Care [facility]) - Progression Note    Patient Details  Name: Nicole Sellers MRN: [id number] Date of Birth: 1950/11/22  Transition of Care [facility]) CM/SW Contact  Purcell Mouton, RN Phone Number: 05/31/2019, 12:14 PM  Clinical Narrative:    Spoke with pt's son Nicole Sellers concerning discharge plan. Pt was and continues to be very adamant that she is going home with hospice [facility]. Son is off work at 1:30PM and will come pick pt up. RN/NT is aware.   Expected Discharge Plan: Home w Hospice Care Barriers to Discharge: Continued Medical Work up  Expected Discharge Plan and Services Expected Discharge Plan: [place]   Discharge Planning Services: CM Consult   Living arrangements for the past 2 months: Single Family Home Expected Discharge Date: 05/31/19                         HH Arranged: RN [facility] Agency: Hospice and [place] Date [place]: 05/27/19 Time [place]: [place] Representative spoke with at [place]: [place] Determinants of Health ([place]) Interventions    Readmission Risk Interventions Readmission Risk Prevention Plan 05/22/2019 05/22/2019 10/02/2018  Transportation Screening - Complete Complete  Medication Review Press photographer) - Complete Complete  PCP or Specialist appointment within 3-5 days of discharge - Complete Not Complete  PCP/Specialist Appt Not Complete comments - - not ready to dc  HRI or [place] - Complete Complete  SW Recovery Care/Counseling Consult - Complete Complete  SW Consult Not Complete Comments - - -  Palliative Care Screening Complete Not Applicable Not Applicable  Skilled Nursing Facility - Not Applicable Not Applicable  Some recent data might be hidden

## 2019-06-12 ENCOUNTER — Other Ambulatory Visit: Payer: Self-pay

## 2019-06-12 ENCOUNTER — Other Ambulatory Visit: Payer: Medicare Other | Admitting: Hospice

## 2019-06-12 DIAGNOSIS — Z515 Encounter for palliative care: Secondary | ICD-10-CM

## 2019-06-12 DIAGNOSIS — C20 Malignant neoplasm of rectum: Secondary | ICD-10-CM

## 2019-06-12 NOTE — Progress Notes (Signed)
[facility] Consult Note Telephone: [phone]  Fax: [phone]  PATIENT NAME: Nicole Sellers DOB: [birth year] MRN: [id number]  PRIMARY CARE PROVIDER:   Reynold Bowen, MD  REFERRING PROVIDER:  Reynold Bowen, MD [place],  [place] [address]  RESPONSIBLE PARTY:   Self [phone] Contact - son - chris Bazaldua [phone]  RECOMMENDATIONS/PLAN:   Advance Care Planning/Goals of Care:   Visit is to build trust and follow-up on palliative care.  Patient affirmed she is a DNR.  MOST selections in Epic include limited additional intervention, determine use of antibiotics, IV fluids for defined period of time,  No feeding tube. Goals of care include to maximize quality of life and symptom management.  Visit consisted of counseling and education dealing with the complex and emotionally intense issues of symptom management and palliative care in the setting of serious and potentially life-threatening illness. Palliative care team will continue to support patient, patient's family, and medical team. Symptom management: Poor  renal function is ongoing; discussions on possible dialysis in the future from nephrologist, per patient.  Education provided on the need of dialysis; patient is agreeable and open to eat when the time comes.  Patient was hospitalized in March' 21 for AKI, abnormal labs Creatinine elevated to 5.01 up from 2.24 Nov 2018, GFR on 04/04/2019 was 15.  Current lab values 05/30/2019: Creatinine 2.70 BUN 57 GFR 17.  Patient continues to be followed by nephrologist.  Type 2 DM is managed with Januvia, current A1c is 5.4 she denies pain/discomfort, hypoglycemic events, no palpitations, dizziness. She lives alone, fairly independent, refuses meals on wheels; she  ambulates with a cane for support; in no acute distress.  Encouraged ongoing care.  Follow up: Palliative care will continue to follow patient for goals of care  clarification and symptom management. I spent  1 hour and 20 minutes providing this consultation; time includes chart review and documentation. More than 50% of the time in this consultation was spent on coordinating communication HISTORY OF PRESENT ILLNESS:  Nicole Sellers is a 69 y.o. year old female with multiple medical problems including stage III rectal cancer diagnosed 2019 status post chemotherapy/resection/colostomy bag, PAD, T2DM, peripheral neuropathy, DVT. Palliative Care was asked to help address goals of care.   CODE STATUS: DNR  PPS: 50% HOSPICE ELIGIBILITY/DIAGNOSIS: TBD  PAST MEDICAL HISTORY:  Past Medical History:  Diagnosis Date  . Age-related nuclear cataract, right eye   . Allergy   . Anemia   . Arthritis    "joints sometimes" (12/25/2012)  . Clotting disorder ([place])   . Colon cancer ([place])   . Critical lower limb ischemia   . Diverticulosis   . Gangrene of toe, Rt second toe 12/25/2012  . GERD (gastroesophageal reflux disease)   . High cholesterol   . Hypertension   . Hypothyroidism   . PAD (peripheral artery disease) ([place])   . PAD (peripheral artery disease) ([place])   . Peripheral neuropathy   . Rectal cancer ([place])   . S/P arterial stent, 12/25/13, successful diamondback orbital rotational arthrectomy, PTA using chocolate  balloon and stenting using I DEV stent of long segment calcified high-grade proximal and mid r 12/25/2012  . Tobacco abuse   . Type II diabetes mellitus ([place])     SOCIAL HX:  Social History   Tobacco Use  . Smoking status: Former Smoker    Packs/day: 0.50    Years: 48.00    Pack  years: 24.00    Types: Cigarettes  . Smokeless tobacco: Never Used  . Tobacco comment: quit in 2016  Substance Use Topics  . Alcohol use: No    Alcohol/week: 0.0 standard drinks    ALLERGIES:  Allergies  Allergen Reactions  . Kiwi Extract Itching and Other (See Comments)    Throat itching   . Lisinopril Other (See Comments)    Bruising   .  Plavix [Clopidogrel] Other (See Comments)    "Made me feel badly- didn't agree with my system"     PERTINENT MEDICATIONS:  Outpatient Encounter Medications as of 06/12/2019  Medication Sig  . albuterol (PROVENTIL HFA;VENTOLIN HFA) 108 (90 BASE) MCG/ACT inhaler Inhale 2 puffs into the lungs every 6 (six) hours as needed for wheezing or shortness of breath.  Marland Kitchen apixaban (ELIQUIS) 5 MG TABS tablet Take 5 mg by mouth 2 (two) times daily.  Marland Kitchen aspirin 81 MG tablet Take 81 mg by mouth daily.  Marland Kitchen atorvastatin (LIPITOR) 10 MG tablet Take 1 tablet (10 mg total) by mouth daily. (Patient taking differently: Take 10 mg by mouth at bedtime. )  . Calcium Carbonate-Vitamin D (CALCIUM 600+D PO) Take 1 tablet by mouth daily.  . cetirizine (ZYRTEC) 10 MG tablet Take 10 mg by mouth daily.  . Cholecalciferol (VITAMIN D3) 50 MCG (2000 UT) TABS Take 4,000 Units by mouth daily.  Marland Kitchen FERREX 150 150 MG capsule Take 150 mg by mouth 2 (two) times daily.  Marland Kitchen gabapentin (NEURONTIN) 300 MG capsule Take 300 mg by mouth 2 (two) times daily.   . isosorbide-hydrALAZINE (BIDIL) 20-37.5 MG tablet Take 1 tablet by mouth 3 (three) times daily.  Marland Kitchen levothyroxine (SYNTHROID, LEVOTHROID) 50 MCG tablet Take 1 tablet (50 mcg total) by mouth daily.  . metoprolol succinate (TOPROL-XL) 25 MG 24 hr tablet Take 0.5 tablets (12.5 mg total) by mouth daily.  . Multiple Vitamins-Minerals (SENIOR MULTIVITAMIN PLUS PO) Take 1 tablet by mouth daily with breakfast.  . omeprazole (PRILOSEC OTC) 20 MG tablet Take 20 mg by mouth daily before breakfast.  . Psyllium (METAMUCIL FIBER PO) Take 1 Scoop by mouth See admin instructions. Mix 1 scoopful into 6-8 ounces of desired beverage and drink two times a day  . sitaGLIPtin (JANUVIA) 25 MG tablet Take 1 tablet (25 mg total) by mouth daily.  Marland Kitchen tetrahydrozoline-zinc (VISINE-AC) 0.05-0.25 % ophthalmic solution Place 1 drop into both eyes 2 (two) times daily as needed (dry eyes).  . torsemide (DEMADEX) 20 MG tablet  Take 3 tablets (60 mg total) by mouth 2 (two) times daily.  . traMADol (ULTRAM) 50 MG tablet Take 1 tablet (50 mg total) by mouth every 12 (twelve) hours as needed for moderate pain.  . Triamcinolone Acetonide (NASACORT ALLERGY 24HR NA) Place 1 spray into both nostrils daily as needed (allergies).   Loura Pardon Salicylate (ASPERCREME EX) Apply 1 application topically daily as needed (to affected areas, for joint pain).   . vitamin B-12 (CYANOCOBALAMIN) 500 MCG tablet Take 500 mcg by mouth daily.   Facility-Administered Encounter Medications as of 06/12/2019  Medication  . 0.9 %  sodium chloride infusion  . acetaminophen (TYLENOL) tablet 650 mg   Or  . acetaminophen (TYLENOL) suppository 650 mg  . diphenhydrAMINE (BENADRYL) 12.5 MG/5ML elixir 12.5 mg   Or  . diphenhydrAMINE (BENADRYL) injection 12.5 mg  . heparin lock flush 100 unit/mL  . ondansetron (ZOFRAN-ODT) disintegrating tablet 4 mg   Or  . ondansetron (ZOFRAN) 4 mg in sodium chloride 0.9 % 50  mL IVPB  . polycarbophil (FIBERCON) tablet 625 mg  . sodium chloride 0.9 % bolus 1,000 mL  . sodium chloride flush (NS) 0.9 % injection 10 mL    PHYSICAL EXAM/ROS:  General: NAD, cooperative Cardiovascular: regular rate and rhythm; denies chest pain Pulmonary: clear ant/post fields; normal respiratory effort Abdomen: soft, nontender, + bowel sounds; colostomy present GU: no suprapubic tenderness Extremities: Mild nonpitting edema, no joint deformities Skin: no rashes to exposed skin Neurological: Weakness but otherwise nonfocal  Teodoro Spray, NP

## 2019-06-27 ENCOUNTER — Inpatient Hospital Stay (HOSPITAL_COMMUNITY)
Admission: EM | Admit: 2019-06-27 | Discharge: 2019-07-04 | DRG: 291 | Disposition: A | Discharge status: Hospice | Payer: Medicare Other | Source: Ambulatory Visit | Attending: Internal Medicine | Admitting: Internal Medicine

## 2019-06-27 ENCOUNTER — Emergency Department (HOSPITAL_COMMUNITY): Payer: Medicare Other

## 2019-06-27 DIAGNOSIS — I509 Heart failure, unspecified: Secondary | ICD-10-CM | POA: Diagnosis present

## 2019-06-27 DIAGNOSIS — Z7982 Long term (current) use of aspirin: Secondary | ICD-10-CM

## 2019-06-27 DIAGNOSIS — I2510 Atherosclerotic heart disease of native coronary artery without angina pectoris: Secondary | ICD-10-CM | POA: Diagnosis present

## 2019-06-27 DIAGNOSIS — J9621 Acute and chronic respiratory failure with hypoxia: Secondary | ICD-10-CM | POA: Diagnosis present

## 2019-06-27 DIAGNOSIS — Z833 Family history of diabetes mellitus: Secondary | ICD-10-CM

## 2019-06-27 DIAGNOSIS — Z09 Encounter for follow-up examination after completed treatment for conditions other than malignant neoplasm: Secondary | ICD-10-CM

## 2019-06-27 DIAGNOSIS — Z85048 Personal history of other malignant neoplasm of rectum, rectosigmoid junction, and anus: Secondary | ICD-10-CM | POA: Diagnosis not present

## 2019-06-27 DIAGNOSIS — E785 Hyperlipidemia, unspecified: Secondary | ICD-10-CM | POA: Diagnosis present

## 2019-06-27 DIAGNOSIS — Z8042 Family history of malignant neoplasm of prostate: Secondary | ICD-10-CM

## 2019-06-27 DIAGNOSIS — N390 Urinary tract infection, site not specified: Secondary | ICD-10-CM | POA: Diagnosis present

## 2019-06-27 DIAGNOSIS — E440 Moderate protein-calorie malnutrition: Secondary | ICD-10-CM | POA: Diagnosis present

## 2019-06-27 DIAGNOSIS — E1140 Type 2 diabetes mellitus with diabetic neuropathy, unspecified: Secondary | ICD-10-CM | POA: Diagnosis present

## 2019-06-27 DIAGNOSIS — E119 Type 2 diabetes mellitus without complications: Secondary | ICD-10-CM

## 2019-06-27 DIAGNOSIS — Z9889 Other specified postprocedural states: Secondary | ICD-10-CM

## 2019-06-27 DIAGNOSIS — Z86718 Personal history of other venous thrombosis and embolism: Secondary | ICD-10-CM | POA: Diagnosis not present

## 2019-06-27 DIAGNOSIS — E874 Mixed disorder of acid-base balance: Secondary | ICD-10-CM | POA: Diagnosis present

## 2019-06-27 DIAGNOSIS — Z8249 Family history of ischemic heart disease and other diseases of the circulatory system: Secondary | ICD-10-CM

## 2019-06-27 DIAGNOSIS — I10 Essential (primary) hypertension: Secondary | ICD-10-CM | POA: Diagnosis present

## 2019-06-27 DIAGNOSIS — R0600 Dyspnea, unspecified: Secondary | ICD-10-CM

## 2019-06-27 DIAGNOSIS — J90 Pleural effusion, not elsewhere classified: Secondary | ICD-10-CM | POA: Diagnosis not present

## 2019-06-27 DIAGNOSIS — J9811 Atelectasis: Secondary | ICD-10-CM | POA: Diagnosis present

## 2019-06-27 DIAGNOSIS — Z20822 Contact with and (suspected) exposure to covid-19: Secondary | ICD-10-CM | POA: Diagnosis present

## 2019-06-27 DIAGNOSIS — E1151 Type 2 diabetes mellitus with diabetic peripheral angiopathy without gangrene: Secondary | ICD-10-CM | POA: Diagnosis present

## 2019-06-27 DIAGNOSIS — Z515 Encounter for palliative care: Secondary | ICD-10-CM | POA: Diagnosis not present

## 2019-06-27 DIAGNOSIS — Z79899 Other long term (current) drug therapy: Secondary | ICD-10-CM

## 2019-06-27 DIAGNOSIS — C20 Malignant neoplasm of rectum: Secondary | ICD-10-CM | POA: Diagnosis not present

## 2019-06-27 DIAGNOSIS — E039 Hypothyroidism, unspecified: Secondary | ICD-10-CM | POA: Diagnosis present

## 2019-06-27 DIAGNOSIS — I82409 Acute embolism and thrombosis of unspecified deep veins of unspecified lower extremity: Secondary | ICD-10-CM | POA: Diagnosis present

## 2019-06-27 DIAGNOSIS — Z923 Personal history of irradiation: Secondary | ICD-10-CM

## 2019-06-27 DIAGNOSIS — Z9221 Personal history of antineoplastic chemotherapy: Secondary | ICD-10-CM | POA: Diagnosis not present

## 2019-06-27 DIAGNOSIS — I130 Hypertensive heart and chronic kidney disease with heart failure and stage 1 through stage 4 chronic kidney disease, or unspecified chronic kidney disease: Secondary | ICD-10-CM | POA: Diagnosis present

## 2019-06-27 DIAGNOSIS — I5023 Acute on chronic systolic (congestive) heart failure: Secondary | ICD-10-CM | POA: Diagnosis present

## 2019-06-27 DIAGNOSIS — R0603 Acute respiratory distress: Secondary | ICD-10-CM | POA: Diagnosis not present

## 2019-06-27 DIAGNOSIS — J918 Pleural effusion in other conditions classified elsewhere: Secondary | ICD-10-CM | POA: Diagnosis present

## 2019-06-27 DIAGNOSIS — N184 Chronic kidney disease, stage 4 (severe): Secondary | ICD-10-CM | POA: Diagnosis present

## 2019-06-27 DIAGNOSIS — Z7901 Long term (current) use of anticoagulants: Secondary | ICD-10-CM | POA: Diagnosis not present

## 2019-06-27 DIAGNOSIS — Z794 Long term (current) use of insulin: Secondary | ICD-10-CM

## 2019-06-27 DIAGNOSIS — Z823 Family history of stroke: Secondary | ICD-10-CM

## 2019-06-27 DIAGNOSIS — E1122 Type 2 diabetes mellitus with diabetic chronic kidney disease: Secondary | ICD-10-CM | POA: Diagnosis present

## 2019-06-27 DIAGNOSIS — Z66 Do not resuscitate: Secondary | ICD-10-CM | POA: Diagnosis present

## 2019-06-27 DIAGNOSIS — Z87891 Personal history of nicotine dependence: Secondary | ICD-10-CM

## 2019-06-27 DIAGNOSIS — Z79890 Hormone replacement therapy (postmenopausal): Secondary | ICD-10-CM

## 2019-06-27 DIAGNOSIS — Z933 Colostomy status: Secondary | ICD-10-CM

## 2019-06-27 DIAGNOSIS — I824Y1 Acute embolism and thrombosis of unspecified deep veins of right proximal lower extremity: Secondary | ICD-10-CM | POA: Diagnosis not present

## 2019-06-27 DIAGNOSIS — Z7189 Other specified counseling: Secondary | ICD-10-CM | POA: Diagnosis not present

## 2019-06-27 HISTORY — DX: Acute embolism and thrombosis of unspecified deep veins of unspecified lower extremity: I82.409

## 2019-06-27 HISTORY — DX: Chronic kidney disease, unspecified: N18.9

## 2019-06-27 HISTORY — DX: Heart failure, unspecified: I50.9

## 2019-06-27 LAB — I-STAT ARTERIAL BLOOD GAS, ED
Acid-base deficit: 13 mmol/L — ABNORMAL HIGH (ref 0.0–2.0)
Bicarbonate: 17.4 mmol/L — ABNORMAL LOW (ref 20.0–28.0)
Calcium, Ion: 1.37 mmol/L (ref 1.15–1.40)
HCT: 34 % — ABNORMAL LOW (ref 36.0–46.0)
Hemoglobin: 11.6 g/dL — ABNORMAL LOW (ref 12.0–15.0)
O2 Saturation: 99 %
Patient temperature: 98.6
Potassium: 5 mmol/L (ref 3.5–5.1)
Sodium: 133 mmol/L — ABNORMAL LOW (ref 135–145)
TCO2: 19 mmol/L — ABNORMAL LOW (ref 22–32)
pCO2 arterial: 62.9 mmHg — ABNORMAL HIGH (ref 32.0–48.0)
pH, Arterial: 7.049 — CL (ref 7.350–7.450)
pO2, Arterial: 218 mmHg — ABNORMAL HIGH (ref 83.0–108.0)

## 2019-06-27 LAB — CBC WITH DIFFERENTIAL/PLATELET
Abs Immature Granulocytes: 0.01 10*3/uL (ref 0.00–0.07)
Basophils Absolute: 0.1 10*3/uL (ref 0.0–0.1)
Basophils Relative: 2 %
Eosinophils Absolute: 0 10*3/uL (ref 0.0–0.5)
Eosinophils Relative: 0 %
HCT: 36.2 % (ref 36.0–46.0)
Hemoglobin: 10.9 g/dL — ABNORMAL LOW (ref 12.0–15.0)
Immature Granulocytes: 0 %
Lymphocytes Relative: 11 %
Lymphs Abs: 0.4 10*3/uL — ABNORMAL LOW (ref 0.7–4.0)
MCH: 29.8 pg (ref 26.0–34.0)
MCHC: 30.1 g/dL (ref 30.0–36.0)
MCV: 98.9 fL (ref 80.0–100.0)
Monocytes Absolute: 0.2 10*3/uL (ref 0.1–1.0)
Monocytes Relative: 5 %
Neutro Abs: 2.5 10*3/uL (ref 1.7–7.7)
Neutrophils Relative %: 82 %
Platelets: 379 10*3/uL (ref 150–400)
RBC: 3.66 MIL/uL — ABNORMAL LOW (ref 3.87–5.11)
RDW: 15.9 % — ABNORMAL HIGH (ref 11.5–15.5)
WBC: 3.1 10*3/uL — ABNORMAL LOW (ref 4.0–10.5)
nRBC: 0 % (ref 0.0–0.2)

## 2019-06-27 LAB — BASIC METABOLIC PANEL
Anion gap: 11 (ref 5–15)
BUN: 23 mg/dL (ref 8–23)
CO2: 16 mmol/L — ABNORMAL LOW (ref 22–32)
Calcium: 9.2 mg/dL (ref 8.9–10.3)
Chloride: 106 mmol/L (ref 98–111)
Creatinine, Ser: 2.43 mg/dL — ABNORMAL HIGH (ref 0.44–1.00)
GFR calc Af Amer: 22 mL/min — ABNORMAL LOW (ref >60)
GFR calc non Af Amer: 19 mL/min — ABNORMAL LOW (ref >60)
Glucose, Bld: 156 mg/dL — ABNORMAL HIGH (ref 70–99)
Potassium: 4.7 mmol/L (ref 3.5–5.1)
Sodium: 133 mmol/L — ABNORMAL LOW (ref 135–145)

## 2019-06-27 LAB — BRAIN NATRIURETIC PEPTIDE: B Natriuretic Peptide: 4114.3 pg/mL — ABNORMAL HIGH (ref 0.0–100.0)

## 2019-06-27 LAB — LACTIC ACID, PLASMA: Lactic Acid, Venous: 1.6 mmol/L (ref 0.5–1.9)

## 2019-06-27 LAB — SARS CORONAVIRUS 2 BY RT PCR (HOSPITAL ORDER, PERFORMED IN ~~LOC~~ HOSPITAL LAB): SARS Coronavirus 2: NEGATIVE

## 2019-06-27 MED ORDER — HYDRALAZINE HCL 50 MG PO TABS
50.0000 mg | ORAL_TABLET | Freq: Four times a day (QID) | ORAL | Status: DC
  Administered 2019-06-28 – 2019-07-04 (×25): 50 mg via ORAL
  Filled 2019-06-27 (×25): qty 1

## 2019-06-27 MED ORDER — TRAMADOL HCL 50 MG PO TABS
50.0000 mg | ORAL_TABLET | Freq: Two times a day (BID) | ORAL | Status: DC | PRN
  Administered 2019-06-28 – 2019-07-03 (×4): 50 mg via ORAL
  Filled 2019-06-27 (×5): qty 1

## 2019-06-27 MED ORDER — APIXABAN 5 MG PO TABS
5.0000 mg | ORAL_TABLET | Freq: Two times a day (BID) | ORAL | Status: DC
  Administered 2019-06-27 – 2019-07-04 (×13): 5 mg via ORAL
  Filled 2019-06-27 (×15): qty 1

## 2019-06-27 MED ORDER — IPRATROPIUM BROMIDE 0.02 % IN SOLN
0.5000 mg | Freq: Four times a day (QID) | RESPIRATORY_TRACT | Status: DC
  Administered 2019-06-27 – 2019-06-29 (×8): 0.5 mg via RESPIRATORY_TRACT
  Filled 2019-06-27 (×8): qty 2.5

## 2019-06-27 MED ORDER — SODIUM BICARBONATE 650 MG PO TABS
650.0000 mg | ORAL_TABLET | Freq: Three times a day (TID) | ORAL | Status: DC
  Administered 2019-06-27 – 2019-07-04 (×19): 650 mg via ORAL
  Filled 2019-06-27 (×22): qty 1

## 2019-06-27 MED ORDER — SODIUM CHLORIDE 0.9% FLUSH
3.0000 mL | Freq: Two times a day (BID) | INTRAVENOUS | Status: DC
  Administered 2019-07-04: 3 mL via INTRAVENOUS

## 2019-06-27 MED ORDER — ONDANSETRON HCL 4 MG/2ML IJ SOLN: 4.0000 mg | Freq: Four times a day (QID) | INTRAMUSCULAR | Status: DC | PRN

## 2019-06-27 MED ORDER — PANTOPRAZOLE SODIUM 40 MG PO TBEC
40.0000 mg | DELAYED_RELEASE_TABLET | Freq: Every day | ORAL | Status: DC
  Administered 2019-06-28 – 2019-07-04 (×6): 40 mg via ORAL
  Filled 2019-06-27 (×7): qty 1

## 2019-06-27 MED ORDER — ASPIRIN EC 81 MG PO TBEC
81.0000 mg | DELAYED_RELEASE_TABLET | Freq: Every day | ORAL | Status: DC
  Administered 2019-06-28 – 2019-07-04 (×6): 81 mg via ORAL
  Filled 2019-06-27 (×7): qty 1

## 2019-06-27 MED ORDER — HEPARIN SODIUM (PORCINE) 5000 UNIT/ML IJ SOLN: 5000.0000 [IU] | Freq: Three times a day (TID) | INTRAMUSCULAR | Status: DC

## 2019-06-27 MED ORDER — SODIUM CHLORIDE 0.9% FLUSH
10.0000 mL | INTRAVENOUS | Status: DC | PRN
  Administered 2019-07-04: 10 mL

## 2019-06-27 MED ORDER — TRIAMCINOLONE ACETONIDE 55 MCG/ACT NA AERO
1.0000 | INHALATION_SPRAY | Freq: Every day | NASAL | Status: DC
  Administered 2019-07-04: 1 via NASAL
  Filled 2019-06-27: qty 10.8

## 2019-06-27 MED ORDER — ALBUTEROL SULFATE (2.5 MG/3ML) 0.083% IN NEBU: 2.5000 mg | INHALATION_SOLUTION | Freq: Four times a day (QID) | RESPIRATORY_TRACT | Status: DC | PRN

## 2019-06-27 MED ORDER — CARVEDILOL 3.125 MG PO TABS
3.1250 mg | ORAL_TABLET | Freq: Two times a day (BID) | ORAL | Status: DC
  Administered 2019-06-28 – 2019-07-04 (×11): 3.125 mg via ORAL
  Filled 2019-06-27 (×13): qty 1

## 2019-06-27 MED ORDER — FUROSEMIDE 10 MG/ML IJ SOLN
40.0000 mg | Freq: Once | INTRAMUSCULAR | Status: AC
  Administered 2019-06-27: 40 mg via INTRAVENOUS
  Filled 2019-06-27: qty 4

## 2019-06-27 MED ORDER — LORAZEPAM 2 MG/ML IJ SOLN
0.5000 mg | Freq: Once | INTRAMUSCULAR | Status: AC
  Administered 2019-06-27: 0.5 mg via INTRAVENOUS
  Filled 2019-06-27: qty 1

## 2019-06-27 MED ORDER — SODIUM CHLORIDE 0.9 % IV SOLN
250.0000 mL | INTRAVENOUS | Status: DC | PRN
  Administered 2019-06-28 – 2019-07-03 (×2): 250 mL via INTRAVENOUS

## 2019-06-27 MED ORDER — LINAGLIPTIN 5 MG PO TABS
5.0000 mg | ORAL_TABLET | Freq: Every day | ORAL | Status: DC
  Administered 2019-06-28: 5 mg via ORAL
  Filled 2019-06-27 (×2): qty 1

## 2019-06-27 MED ORDER — ACETAMINOPHEN 325 MG PO TABS: 650.0000 mg | ORAL_TABLET | ORAL | Status: DC | PRN

## 2019-06-27 MED ORDER — VITAMIN B-12 1000 MCG PO TABS
500.0000 ug | ORAL_TABLET | Freq: Every day | ORAL | Status: DC
  Administered 2019-06-28 – 2019-07-04 (×6): 500 ug via ORAL
  Filled 2019-06-27 (×7): qty 1

## 2019-06-27 MED ORDER — VITAMIN D 25 MCG (1000 UNIT) PO TABS
4000.0000 [IU] | ORAL_TABLET | Freq: Every day | ORAL | Status: DC
  Administered 2019-06-28 – 2019-07-04 (×6): 4000 [IU] via ORAL
  Filled 2019-06-27 (×7): qty 4

## 2019-06-27 MED ORDER — CHLORHEXIDINE GLUCONATE CLOTH 2 % EX PADS
6.0000 | MEDICATED_PAD | Freq: Every day | CUTANEOUS | Status: DC
  Administered 2019-06-28 – 2019-07-04 (×7): 6 via TOPICAL

## 2019-06-27 MED ORDER — LORATADINE 10 MG PO TABS
10.0000 mg | ORAL_TABLET | Freq: Every day | ORAL | Status: DC
  Administered 2019-06-28 – 2019-07-04 (×6): 10 mg via ORAL
  Filled 2019-06-27 (×7): qty 1

## 2019-06-27 MED ORDER — SODIUM CHLORIDE 0.9% FLUSH
10.0000 mL | Freq: Two times a day (BID) | INTRAVENOUS | Status: DC
  Administered 2019-06-28 – 2019-07-04 (×7): 10 mL

## 2019-06-27 MED ORDER — SODIUM BICARBONATE 4.2 % IV SOLN
50.0000 meq | Freq: Once | INTRAVENOUS | Status: DC
  Filled 2019-06-27 (×2): qty 100

## 2019-06-27 MED ORDER — ATORVASTATIN CALCIUM 10 MG PO TABS
10.0000 mg | ORAL_TABLET | Freq: Every day | ORAL | Status: DC
  Administered 2019-06-28 – 2019-07-03 (×6): 10 mg via ORAL
  Filled 2019-06-27 (×7): qty 1

## 2019-06-27 MED ORDER — NAPHAZOLINE-GLYCERIN 0.012-0.2 % OP SOLN
1.0000 [drp] | Freq: Four times a day (QID) | OPHTHALMIC | Status: DC | PRN
  Filled 2019-06-27: qty 15

## 2019-06-27 MED ORDER — TROLAMINE SALICYLATE 10 % EX LOTN: TOPICAL_LOTION | Freq: Every day | CUTANEOUS | Status: DC | PRN

## 2019-06-27 MED ORDER — LEVOTHYROXINE SODIUM 50 MCG PO TABS
50.0000 ug | ORAL_TABLET | Freq: Every day | ORAL | Status: DC
  Administered 2019-06-28 – 2019-07-04 (×7): 50 ug via ORAL
  Filled 2019-06-27 (×7): qty 1

## 2019-06-27 MED ORDER — POLYSACCHARIDE IRON COMPLEX 150 MG PO CAPS
150.0000 mg | ORAL_CAPSULE | Freq: Two times a day (BID) | ORAL | Status: DC
  Administered 2019-06-28 – 2019-07-04 (×12): 150 mg via ORAL
  Filled 2019-06-27 (×15): qty 1

## 2019-06-27 MED ORDER — NITROGLYCERIN 0.4 MG SL SUBL
0.4000 mg | SUBLINGUAL_TABLET | SUBLINGUAL | Status: DC | PRN
  Administered 2019-06-27: 0.4 mg via SUBLINGUAL
  Filled 2019-06-27: qty 1

## 2019-06-27 MED ORDER — PSYLLIUM 95 % PO PACK
1.0000 | PACK | Freq: Two times a day (BID) | ORAL | Status: DC | PRN
  Filled 2019-06-27: qty 1

## 2019-06-27 MED ORDER — SODIUM BICARBONATE 8.4 % IV SOLN
50.0000 meq | Freq: Once | INTRAVENOUS | Status: AC
  Administered 2019-06-27: 50 meq via INTRAVENOUS

## 2019-06-27 MED ORDER — GABAPENTIN 300 MG PO CAPS
300.0000 mg | ORAL_CAPSULE | Freq: Two times a day (BID) | ORAL | Status: DC
  Administered 2019-06-28 – 2019-07-04 (×12): 300 mg via ORAL
  Filled 2019-06-27 (×14): qty 1

## 2019-06-27 MED ORDER — LORAZEPAM 1 MG PO TABS: 1.0000 mg | ORAL_TABLET | Freq: Once | ORAL | Status: DC

## 2019-06-27 MED ORDER — FUROSEMIDE 10 MG/ML IJ SOLN
40.0000 mg | Freq: Two times a day (BID) | INTRAMUSCULAR | Status: DC
  Administered 2019-06-27 – 2019-07-01 (×8): 40 mg via INTRAVENOUS
  Filled 2019-06-27 (×8): qty 4

## 2019-06-27 MED ORDER — SODIUM CHLORIDE 0.9% FLUSH: 3.0000 mL | INTRAVENOUS | Status: DC | PRN

## 2019-06-27 MED ORDER — ISOSORBIDE MONONITRATE ER 30 MG PO TB24
30.0000 mg | ORAL_TABLET | Freq: Every day | ORAL | Status: DC
  Administered 2019-06-28 – 2019-07-04 (×6): 30 mg via ORAL
  Filled 2019-06-27 (×7): qty 1

## 2019-06-27 MED ORDER — CALCIUM CARBONATE-VITAMIN D 500-200 MG-UNIT PO TABS
1.0000 | ORAL_TABLET | Freq: Every day | ORAL | Status: DC
  Administered 2019-06-28 – 2019-07-04 (×7): 1 via ORAL
  Filled 2019-06-27 (×8): qty 1

## 2019-06-27 NOTE — ED Notes (Signed)
RT at bedside.

## 2019-06-27 NOTE — Progress Notes (Signed)
   06/27/19 2113  Assess: MEWS Score  BP 138/82  Pulse Rate (!) 108  Resp 20  SpO2 95 %  O2 Device Bi-PAP  Assess: MEWS Score  MEWS Temp 0  MEWS Systolic 0  MEWS Pulse 1  MEWS RR 0  MEWS LOC 1  MEWS Score 2  MEWS Score Color Yellow  Assess: if the MEWS score is Yellow or Red  Were vital signs taken at a resting state? No (pt on bipap, had to get pt situated )  Focused Assessment Documented focused assessment  Early Detection of Sepsis Score *See Row Information* Low  MEWS guidelines implemented *See Row Information* Yes  Treat  MEWS Interventions Administered scheduled meds/treatments;Administered prn meds/treatments  Take Vital Signs  Increase Vital Sign Frequency  Yellow: Q 2hr X 2 then Q 4hr X 2, if remains yellow, continue Q 4hrs  Notify: Charge Nurse/RN  Name of Charge Nurse/RN Notified Jequetta RN   Date Charge Nurse/RN Notified 06/27/19  Time Charge Nurse/RN Notified 2130

## 2019-06-27 NOTE — ED Notes (Signed)
Pt work of breathing increasing, pt diaphoretic, in obvious distress, EDP aware. Pt to be placed on bipap.

## 2019-06-27 NOTE — H&P (Addendum)
History and Physical    Nicole Sellers RSW:[id number] DOB: [birth year] DOA: 06/27/2019  PCP: Reynold Bowen, MD (Confirm with patient/family/NH records and if not entered, this has to be entered at [facility] point of entry) Patient coming from: [facility]  I have personally briefly reviewed patient's old medical records in [place]  Chief Complaint: SOB  HPI: Nicole Sellers is a 69 y.o. female with medical history significant of stage III rectal cancer status post chemotherapy, radiation and resection, hypertension, IIDM, hypothyroidism, peripheral vascular disease, deep vein thrombosis and chronic kidney disease stage IV, presented with increasing short of breath for 7 days.  Patient was recently hospitalized in May when she was found to have a new onset of systolic CHF with LVEF 20 to 25% with severe diffuse global hypokinesis.  However patient refused further ischemia work-up, and was discharged on May 20.  Upon discharge, patient was supposed to take torsemide 60 mg twice daily, however patient told me that she has been only take torsemide 20 mg 3 times a week started on June 7, patient however cannot provide me with more details regarding my such a change was made. ED Course: Patient has severe respiratory distress, ABG shows pH 7.0, PCO2 62, bicarb 16.  BiPAP was started  Review of Systems: Difficult to obtain patient on BiPAP as significant short of breath  Past Medical History:  Diagnosis Date  . Age-related nuclear cataract, right eye   . Allergy   . Anemia   . Arthritis    "joints sometimes" (12/25/2012)  . Clotting disorder ([place])   . Colon cancer ([place])   . Critical lower limb ischemia   . Diverticulosis   . Gangrene of toe, Rt second toe 12/25/2012  . GERD (gastroesophageal reflux disease)   . High cholesterol   . Hypertension   . Hypothyroidism   . PAD (peripheral artery disease) ([place])   . PAD (peripheral artery disease) ([place])   . Peripheral neuropathy   . Rectal cancer  ([place])   . S/P arterial stent, 12/25/13, successful diamondback orbital rotational arthrectomy, PTA using chocolate  balloon and stenting using I DEV stent of long segment calcified high-grade proximal and mid r 12/25/2012  . Tobacco abuse   . Type II diabetes mellitus ([place])     Past Surgical History:  Procedure Laterality Date  . ABDOMINAL AORTAGRAM  12/20/2012   Procedure: ABDOMINAL AORTAGRAM;  Surgeon: Lorretta Harp, MD;  Location: [facility] CATH LAB;  Service: Cardiovascular;;  . ABDOMINAL HYSTERECTOMY  2000  . ANGIOPLASTY / STENTING FEMORAL Right 12/25/2012  . ATHERECTOMY Right 12/25/2012   Procedure: ATHERECTOMY;  Surgeon: Lorretta Harp, MD;  Location: [facility] CATH LAB;  Service: Cardiovascular;  Laterality: Right;  right SFA  . COLONOSCOPY    . DIVERTING ILEOSTOMY N/A 08/03/2018   Procedure: DIVERTING LOOP ILEOSTOMY;  Surgeon: Leighton Ruff, MD;  Location: [facility] ORS;  Service: General;  Laterality: N/A;  . ESOPHAGOGASTRODUODENOSCOPY N/A 10/04/2018   Procedure: ESOPHAGOGASTRODUODENOSCOPY (EGD);  Surgeon: Carol Ada, MD;  Location: [facility] ENDOSCOPY;  Service: Endoscopy;  Laterality: N/A;  . EYE SURGERY Left    cataract  . HERNIA REPAIR    . LOWER EXTREMITY ANGIOGRAM N/A 12/20/2012   Procedure: LOWER EXTREMITY ANGIOGRAM;  Surgeon: Lorretta Harp, MD;  Location: [facility] CATH LAB;  Service: Cardiovascular;  Laterality: N/A;  . LOWER EXTREMITY ANGIOGRAM N/A 12/25/2012   Procedure: LOWER EXTREMITY ANGIOGRAM;  Surgeon: Lorretta Harp, MD;  Location: [facility] CATH LAB;  Service: Cardiovascular;  Laterality: N/A;  .  LOWER EXTREMITY INTERVENTION  05/08/2017  . LOWER EXTREMITY INTERVENTION Bilateral 05/08/2017   Procedure: LOWER EXTREMITY INTERVENTION;  Surgeon: Lorretta Harp, MD;  Location: [place] CV LAB;  Service: Cardiovascular;  Laterality: Bilateral;  . PERIPHERAL VASCULAR BALLOON ANGIOPLASTY Right 05/08/2017   Procedure: PERIPHERAL VASCULAR BALLOON ANGIOPLASTY;  Surgeon: Lorretta Harp, MD;   Location: [place] CV LAB;  Service: Cardiovascular;  Laterality: Right;  sfa  . PERIPHERAL VASCULAR INTERVENTION Right 05/08/2017   Procedure: PERIPHERAL VASCULAR INTERVENTION;  Surgeon: Lorretta Harp, MD;  Location: [place] CV LAB;  Service: Cardiovascular;  Laterality: Right;  ext iliac  . PORTACATH PLACEMENT Right 11/22/2017   Procedure: INSERTION PORT-A-CATH;  Surgeon: Leighton Ruff, MD;  Location: [facility] ORS;  Service: General;  Laterality: Right;  . SAVORY DILATION N/A 10/04/2018   Procedure: SAVORY DILATION;  Surgeon: Carol Ada, MD;  Location: [facility] ENDOSCOPY;  Service: Endoscopy;  Laterality: N/A;  . TOE SURGERY Right    2n toe   . UMBILICAL HERNIA REPAIR  2000  . UMBILICAL HERNIA REPAIR N/A 08/03/2018   Procedure: UMBILICAL HERNIA REPAIR;  Surgeon: Leighton Ruff, MD;  Location: [facility] ORS;  Service: General;  Laterality: N/A;  . XI ROBOTIC ASSISTED LOWER ANTERIOR RESECTION N/A 08/03/2018   Procedure: XI ROBOTIC ASSISTED LOWER ANTERIOR RESECTION; DIAGNOSTIC FLEXIBLE SIGMOIDOSCOPY; INTRAOPERATIVE ASSESSMENT OF VASCULAR PERFUSION;  Surgeon: Leighton Ruff, MD;  Location: [facility] ORS;  Service: General;  Laterality: N/A;     reports that she has quit smoking. Her smoking use included cigarettes. She has a 24.00 pack-year smoking history. She has never used smokeless tobacco. She reports that she does not drink alcohol and does not use drugs.  Allergies  Allergen Reactions  . Kiwi Extract Itching and Other (See Comments)    Throat itching   . Lisinopril Other (See Comments)    Bruising   . Plavix [Clopidogrel] Other (See Comments)    "Made me feel badly- didn't agree with my system"    Family History  Problem Relation Age of Onset  . Hypertension Mother   . Stroke Mother   . Cancer Father        prostate  . Mental illness Sister   . Diabetes Brother   . Hypertension Brother   . Cancer Brother 58       prostate  . Diabetes Brother   . Hypertension Brother   . Cancer Brother         "tumors on neck and body"      Prior to Admission medications   Medication Sig Start Date End Date Taking? Authorizing Provider  albuterol (PROVENTIL HFA;VENTOLIN HFA) 108 (90 BASE) MCG/ACT inhaler Inhale 2 puffs into the lungs every 6 (six) hours as needed for wheezing or shortness of breath.   Yes [provider]  apixaban (ELIQUIS) 5 MG TABS tablet Take 5 mg by mouth 2 (two) times daily.   Yes [provider]  aspirin 81 MG tablet Take 81 mg by mouth daily.   Yes [provider]  atorvastatin (LIPITOR) 10 MG tablet Take 1 tablet (10 mg total) by mouth daily. Patient taking differently: Take 10 mg by mouth at bedtime.  01/01/18  Yes Minette Brine, FNP  Calcium Carbonate-Vitamin D (CALCIUM 600+D PO) Take 1 tablet by mouth daily.   Yes [provider]  cetirizine (ZYRTEC) 10 MG tablet Take 10 mg by mouth daily.   Yes [provider]  Cholecalciferol (VITAMIN D3) 50 MCG (2000 UT) TABS Take 4,000 Units by  mouth daily.   Yes [provider]  FERREX 150 150 MG capsule Take 150 mg by mouth 2 (two) times daily. 08/29/17  Yes [provider]  furosemide (LASIX) 40 MG tablet Take 20 mg by mouth every other day.  06/17/19  Yes [provider]  gabapentin (NEURONTIN) 300 MG capsule Take 300 mg by mouth 2 (two) times daily.    Yes [provider]  levothyroxine (SYNTHROID, LEVOTHROID) 50 MCG tablet Take 1 tablet (50 mcg total) by mouth daily. 01/01/18  Yes Minette Brine, FNP  Multiple Vitamins-Minerals (SENIOR MULTIVITAMIN PLUS PO) Take 1 tablet by mouth daily with breakfast.   Yes [provider]  omeprazole (PRILOSEC OTC) 20 MG tablet Take 20 mg by mouth daily before breakfast.   Yes [provider]  Psyllium (METAMUCIL FIBER PO) Take 1 Scoop by mouth See admin instructions. Mix 1 scoopful into 6-8 ounces of desired beverage and drink two times a day   Yes [provider]  sitaGLIPtin  (JANUVIA) 25 MG tablet Take 1 tablet (25 mg total) by mouth daily. 04/04/19  Yes Donne Hazel, MD  tetrahydrozoline-zinc (VISINE-AC) 0.05-0.25 % ophthalmic solution Place 1 drop into both eyes 2 (two) times daily as needed (dry eyes).   Yes [provider]  traMADol (ULTRAM) 50 MG tablet Take 1 tablet (50 mg total) by mouth every 12 (twelve) hours as needed for moderate pain. 04/04/19  Yes Donne Hazel, MD  Triamcinolone Acetonide (NASACORT ALLERGY 24HR NA) Place 1 spray into both nostrils daily as needed (allergies).    Yes [provider]  Trolamine Salicylate (ASPERCREME EX) Apply 1 application topically daily as needed (to affected areas, for joint pain).    Yes [provider]  vitamin B-12 (CYANOCOBALAMIN) 500 MCG tablet Take 500 mcg by mouth daily.   Yes [provider]  isosorbide-hydrALAZINE (BIDIL) 20-37.5 MG tablet Take 1 tablet by mouth 3 (three) times daily. Patient not taking: Reported on 06/27/2019 05/30/19 06/29/19  Arrien, Jimmy Picket, MD  metoprolol succinate (TOPROL-XL) 25 MG 24 hr tablet Take 0.5 tablets (12.5 mg total) by mouth daily. Patient not taking: Reported on 06/27/2019 05/31/19 06/30/19  Arrien, Jimmy Picket, MD  torsemide (DEMADEX) 20 MG tablet Take 3 tablets (60 mg total) by mouth 2 (two) times daily. Patient not taking: Reported on 06/27/2019 05/30/19 06/29/19  Arrien, Jimmy Picket, MD    Physical Exam: Vitals:   06/27/19 1330 06/27/19 1515 06/27/19 1539 06/27/19 1745  BP: 133/87 (!) 155/99  (!) 148/82  Pulse:   79   Resp: 19  (!) 22   Temp:      TempSrc:      SpO2: 97%  97%   Weight:      Height:        Constitutional: NAD, calm, comfortable Vitals:   06/27/19 1330 06/27/19 1515 06/27/19 1539 06/27/19 1745  BP: 133/87 (!) 155/99  (!) 148/82  Pulse:   79   Resp: 19  (!) 22   Temp:      TempSrc:      SpO2: 97%  97%   Weight:      Height:       Eyes: PERRL, lids and conjunctivae normal ENMT: Mucous  membranes are moist. Posterior pharynx clear of any exudate or lesions.Normal dentition.  Neck: normal, supple, no masses, no thyromegaly Respiratory: diffused B/L crackles. Increased respiratory effort. Use of accessory muscle use.  Cardiovascular: Regular rate and rhythm, no murmurs / rubs / gallops. 2+  extremity edema. 2+ pedal pulses. No carotid bruits.  Abdomen: no tenderness, no masses palpated. No hepatosplenomegaly. Bowel sounds positive.  Musculoskeletal: no clubbing / cyanosis. No joint deformity upper and lower extremities. Good ROM, no contractures. Normal muscle tone.  Skin: no rashes, lesions, ulcers. No induration Neurologic: Strength 5/5 in all 4.  Psychiatric: Normal judgment and insight. Alert and oriented x 3. Normal mood.    Labs on Admission: I have personally reviewed following labs and imaging studies  CBC: Recent Labs  Lab 06/27/19 1425 06/27/19 1527  WBC 3.1*  --   NEUTROABS 2.5  --   HGB 10.9* 11.6*  HCT 36.2 34.0*  MCV 98.9  --   PLT 379  --    Basic Metabolic Panel: Recent Labs  Lab 06/27/19 1425 06/27/19 1527  NA 133* 133*  K 4.7 5.0  CL 106  --   CO2 16*  --   GLUCOSE 156*  --   BUN 23  --   CREATININE 2.43*  --   CALCIUM 9.2  --    GFR: Estimated Creatinine Clearance: 21.6 mL/min (A) (by C-G formula based on SCr of 2.43 mg/dL (H)). Liver Function Tests: No results for input(s): AST, ALT, ALKPHOS, BILITOT, PROT, ALBUMIN in the last 168 hours. No results for input(s): LIPASE, AMYLASE in the last 168 hours. No results for input(s): AMMONIA in the last 168 hours. Coagulation Profile: No results for input(s): INR, PROTIME in the last 168 hours. Cardiac Enzymes: No results for input(s): CKTOTAL, CKMB, CKMBINDEX, TROPONINI in the last 168 hours. BNP (last 3 results) No results for input(s): PROBNP in the last 8760 hours. HbA1C: No results for input(s): HGBA1C in the last 72 hours. CBG: No results for input(s): GLUCAP in the last 168  hours. Lipid Profile: No results for input(s): CHOL, HDL, LDLCALC, TRIG, CHOLHDL, LDLDIRECT in the last 72 hours. Thyroid Function Tests: No results for input(s): TSH, T4TOTAL, FREET4, T3FREE, THYROIDAB in the last 72 hours. Anemia Panel: No results for input(s): VITAMINB12, FOLATE, FERRITIN, TIBC, IRON, RETICCTPCT in the last 72 hours. Urine analysis:    Component Value Date/Time   COLORURINE YELLOW 03/30/2019 0759   APPEARANCEUR HAZY (A) 03/30/2019 0759   LABSPEC 1.011 03/30/2019 0759   PHURINE 5.0 03/30/2019 0759   GLUCOSEU NEGATIVE 03/30/2019 0759   HGBUR NEGATIVE 03/30/2019 0759   BILIRUBINUR NEGATIVE 03/30/2019 0759   BILIRUBINUR negative 11/27/2014 [place] 03/30/2019 0759   PROTEINUR NEGATIVE 03/30/2019 0759   UROBILINOGEN 0.2 11/27/2014 1052   NITRITE NEGATIVE 03/30/2019 0759   LEUKOCYTESUR MODERATE (A) 03/30/2019 0759    Radiological Exams on Admission: DG Chest Portable 1 View  Result Date: 06/27/2019 CLINICAL DATA:  Shortness of breath EXAM: PORTABLE CHEST 1 VIEW COMPARISON:  05/21/2019 FINDINGS: Cardiac shadow is enlarged. Aortic calcifications are noted. Right chest wall port is again seen and stable. Increasing bilateral pleural effusions is seen with likely underlying atelectasis. No acute bony abnormality is noted. IMPRESSION: Increasing bilateral pleural effusions. Electronically Signed   By: Inez Catalina M.D.   On: 06/27/2019 13:37    EKG: Independently reviewed. LBBB chronic  Assessment/Plan Active Problems:   CHF (congestive heart failure) (HCC)  Acute on chronic systolic CHF decompensation -Likely from not suffient diuresis, she is fluid overloaded now. -IV lasix BID -BIPAP until symptoms stabilized. Admit to PCU  Acute combined respiratory and metabolic acidosis -pH 7.0, keep 1 amp of bicarb, recheck pH in 2 hours -Start PO bicarb, consider nephro consult if kidney function  worsens or PH/BICARB not improving.  Hypertension,  uncontrolled -Likely from overload, receiving IV diuresis -continue home meds, add Coreg  Rectal CA stage III - Status post resection, ostomy in place, follow-up as an outpatient. -Patient reiterated prefer CODE STATUS as DNR, consult palliative care  Hypothyroidism -Continue levothyroxine.  Hx of DVT.  -Continue anticoagulation with apixaban  DVT prophylaxis: Eliquis Code Status: DNR Family Communication: None at bedside Disposition Plan: Pt sick, will need more than 2 midnight hospital stay to stabilize acid-base and CHF conditions. Consults called: None Admission status: PCU   Lequita Halt MD [facility] Pager [phone]   06/27/2019, 6:54 PM

## 2019-06-27 NOTE — ED Triage Notes (Signed)
Pt to ED via EMS from PCP for follow up with recent hospitalization, just discharged from the hospital a few weeks ago for CHF.   Pt c/o ongoing SHOB , BILAT LE edema.   Medical HX, CHF, DM Last VS: 146/98, 108 hr, 98%RA, TEMP 98.6, No medications given by EMS.

## 2019-06-27 NOTE — ED Provider Notes (Addendum)
[facility] EMERGENCY DEPARTMENT Provider Note   CSN: [id number] Arrival date & time: 06/27/19  1241     History Chief Complaint  Patient presents with  . Shortness of Breath    Nicole Sellers is a 69 y.o. female with a past medical history significant for hypertension, diabetes, PAD, CAD, rectal cancer stage III status post chemotherapy and radiation, history of DVT on Eliquis, hypothyroidism, CKD, and congestive heart failure who presents to the ED due to worsening shortness of breath for the past week.  Patient states shortness of breath is constant however, worse when lying flat.  Admits to using 2 pillows at night.  Denies associated chest pain.  Admits to worsening bilateral lower extremity edema.  She admits to taking Lasix every other day which she has been compliant with.  Patient also admits to a productive cough for the past week with clear phlegm.  Denies fever, chills, sick contacts, and Covid exposures.  She has received both her Covid vaccines.  Denies tobacco abuse.  Not currently on any home oxygen.  Chart reviewed.  Patient was admitted to the hospital on 5/11 to 05/30/2019 due to acute on chronic congestive heart failure exacerbation.  History obtained from patient and past medical records. No interpreter used during encounter.      Past Medical History:  Diagnosis Date  . Age-related nuclear cataract, right eye   . Allergy   . Anemia   . Arthritis    "joints sometimes" (12/25/2012)  . Clotting disorder ([place])   . Colon cancer ([place])   . Critical lower limb ischemia   . Diverticulosis   . Gangrene of toe, Rt second toe 12/25/2012  . GERD (gastroesophageal reflux disease)   . High cholesterol   . Hypertension   . Hypothyroidism   . PAD (peripheral artery disease) ([place])   . PAD (peripheral artery disease) ([place])   . Peripheral neuropathy   . Rectal cancer ([place])   . S/P arterial stent, 12/25/13, successful diamondback orbital rotational  arthrectomy, PTA using chocolate  balloon and stenting using I DEV stent of long segment calcified high-grade proximal and mid r 12/25/2012  . Tobacco abuse   . Type II diabetes mellitus [facility])     Patient Active Problem List   Diagnosis Date Noted  . Stage 5 chronic kidney disease not on chronic dialysis ([place])   . Palliative care by specialist   . Goals of care, counseling/discussion   . General weakness   . CKD (chronic kidney disease) stage 4, GFR 15-29 ml/min (HCC) 05/21/2019  . Fluid overload 05/21/2019  . Acute congestive heart failure ([place]) 05/21/2019  . Anemia of chronic disease 05/21/2019  . Transaminitis 05/21/2019  . Acute kidney injury ([place]) 03/29/2019  . DVT (deep venous thrombosis) ([place]) 03/29/2019  . Oral candidiasis   . AKI (acute kidney injury) ([place]) 10/01/2018  . Hypokalemia 10/01/2018  . Pyuria 10/01/2018  . Odynophagia 10/01/2018  . Hyponatremia 10/01/2018  . Right sided weakness 10/01/2018  . SBO (small bowel obstruction) ([place]) 08/22/2018  . Dehydration 08/21/2018  . Pre-operative cardiovascular examination 06/07/2018  . Rectal cancer ([place]) 11/06/2017  . Claudication in peripheral vascular disease ([place]) 05/08/2017  . Carotid artery disease ([place]) 04/14/2017  . PAD (peripheral artery disease) Rt ABI 0.7, Lt ABI 0.46 12/25/2012  . S/P arterial stent, 12/25/13, successful diamondback orbital rotational arthrectomy, PTA using chocolate  balloon and stenting using I DEV stent of long segment calcified high-grade proximal and mid r 12/25/2012  .  Gangrene of toe, Rt second toe 12/25/2012  . Essential hypertension 12/13/2012  . Non-insulin treated type 2 diabetes mellitus ([place]) 12/13/2012  . Critical lower limb ischemia 12/13/2012  . Tobacco abuse 12/13/2012    Past Surgical History:  Procedure Laterality Date  . ABDOMINAL AORTAGRAM  12/20/2012   Procedure: ABDOMINAL AORTAGRAM;  Surgeon: Lorretta Harp, MD;  Location: [facility] CATH LAB;  Service: Cardiovascular;;  .  ABDOMINAL HYSTERECTOMY  2000  . ANGIOPLASTY / STENTING FEMORAL Right 12/25/2012  . ATHERECTOMY Right 12/25/2012   Procedure: ATHERECTOMY;  Surgeon: Lorretta Harp, MD;  Location: [facility] CATH LAB;  Service: Cardiovascular;  Laterality: Right;  right SFA  . COLONOSCOPY    . DIVERTING ILEOSTOMY N/A 08/03/2018   Procedure: DIVERTING LOOP ILEOSTOMY;  Surgeon: Leighton Ruff, MD;  Location: [facility] ORS;  Service: General;  Laterality: N/A;  . ESOPHAGOGASTRODUODENOSCOPY N/A 10/04/2018   Procedure: ESOPHAGOGASTRODUODENOSCOPY (EGD);  Surgeon: Carol Ada, MD;  Location: [facility] ENDOSCOPY;  Service: Endoscopy;  Laterality: N/A;  . EYE SURGERY Left    cataract  . HERNIA REPAIR    . LOWER EXTREMITY ANGIOGRAM N/A 12/20/2012   Procedure: LOWER EXTREMITY ANGIOGRAM;  Surgeon: Lorretta Harp, MD;  Location: [facility] CATH LAB;  Service: Cardiovascular;  Laterality: N/A;  . LOWER EXTREMITY ANGIOGRAM N/A 12/25/2012   Procedure: LOWER EXTREMITY ANGIOGRAM;  Surgeon: Lorretta Harp, MD;  Location: [facility] CATH LAB;  Service: Cardiovascular;  Laterality: N/A;  . LOWER EXTREMITY INTERVENTION  05/08/2017  . LOWER EXTREMITY INTERVENTION Bilateral 05/08/2017   Procedure: LOWER EXTREMITY INTERVENTION;  Surgeon: Lorretta Harp, MD;  Location: [facility] CV LAB;  Service: Cardiovascular;  Laterality: Bilateral;  . PERIPHERAL VASCULAR BALLOON ANGIOPLASTY Right 05/08/2017   Procedure: PERIPHERAL VASCULAR BALLOON ANGIOPLASTY;  Surgeon: Lorretta Harp, MD;  Location: [facility] CV LAB;  Service: Cardiovascular;  Laterality: Right;  sfa  . PERIPHERAL VASCULAR INTERVENTION Right 05/08/2017   Procedure: PERIPHERAL VASCULAR INTERVENTION;  Surgeon: Lorretta Harp, MD;  Location: [facility] CV LAB;  Service: Cardiovascular;  Laterality: Right;  ext iliac  . PORTACATH PLACEMENT Right 11/22/2017   Procedure: INSERTION PORT-A-CATH;  Surgeon: Leighton Ruff, MD;  Location: [facility] ORS;  Service: General;  Laterality: Right;  . SAVORY DILATION N/A  10/04/2018   Procedure: SAVORY DILATION;  Surgeon: Carol Ada, MD;  Location: [facility] ENDOSCOPY;  Service: Endoscopy;  Laterality: N/A;  . TOE SURGERY Right    2n toe   . UMBILICAL HERNIA REPAIR  2000  . UMBILICAL HERNIA REPAIR N/A 08/03/2018   Procedure: UMBILICAL HERNIA REPAIR;  Surgeon: Leighton Ruff, MD;  Location: [facility] ORS;  Service: General;  Laterality: N/A;  . XI ROBOTIC ASSISTED LOWER ANTERIOR RESECTION N/A 08/03/2018   Procedure: XI ROBOTIC ASSISTED LOWER ANTERIOR RESECTION; DIAGNOSTIC FLEXIBLE SIGMOIDOSCOPY; INTRAOPERATIVE ASSESSMENT OF VASCULAR PERFUSION;  Surgeon: Leighton Ruff, MD;  Location: [facility] ORS;  Service: General;  Laterality: N/A;     OB History   No obstetric history on file.     Family History  Problem Relation Age of Onset  . Hypertension Mother   . Stroke Mother   . Cancer Father        prostate  . Mental illness Sister   . Diabetes Brother   . Hypertension Brother   . Cancer Brother 62       prostate  . Diabetes Brother   . Hypertension Brother   . Cancer Brother        "tumors on neck and body"     Social History  Tobacco Use  . Smoking status: Former Smoker    Packs/day: 0.50    Years: 48.00    Pack years: 24.00    Types: Cigarettes  . Smokeless tobacco: Never Used  . Tobacco comment: quit in 2016  Vaping Use  . Vaping Use: Never used  Substance Use Topics  . Alcohol use: No    Alcohol/week: 0.0 standard drinks  . Drug use: No    Home Medications Prior to Admission medications   Medication Sig Start Date End Date Taking? Authorizing Provider  albuterol (PROVENTIL HFA;VENTOLIN HFA) 108 (90 BASE) MCG/ACT inhaler Inhale 2 puffs into the lungs every 6 (six) hours as needed for wheezing or shortness of breath.   Yes [provider]  apixaban (ELIQUIS) 5 MG TABS tablet Take 5 mg by mouth 2 (two) times daily.   Yes [provider]  aspirin 81 MG tablet Take 81 mg by mouth daily.   Yes [provider]  atorvastatin  (LIPITOR) 10 MG tablet Take 1 tablet (10 mg total) by mouth daily. Patient taking differently: Take 10 mg by mouth at bedtime.  01/01/18  Yes Minette Brine, FNP  Calcium Carbonate-Vitamin D (CALCIUM 600+D PO) Take 1 tablet by mouth daily.   Yes [provider]  cetirizine (ZYRTEC) 10 MG tablet Take 10 mg by mouth daily.   Yes [provider]  Cholecalciferol (VITAMIN D3) 50 MCG (2000 UT) TABS Take 4,000 Units by mouth daily.   Yes [provider]  FERREX 150 150 MG capsule Take 150 mg by mouth 2 (two) times daily. 08/29/17  Yes [provider]  furosemide (LASIX) 40 MG tablet Take 20 mg by mouth every other day.  06/17/19  Yes [provider]  gabapentin (NEURONTIN) 300 MG capsule Take 300 mg by mouth 2 (two) times daily.    Yes [provider]  levothyroxine (SYNTHROID, LEVOTHROID) 50 MCG tablet Take 1 tablet (50 mcg total) by mouth daily. 01/01/18  Yes Minette Brine, FNP  Multiple Vitamins-Minerals (SENIOR MULTIVITAMIN PLUS PO) Take 1 tablet by mouth daily with breakfast.   Yes [provider]  omeprazole (PRILOSEC OTC) 20 MG tablet Take 20 mg by mouth daily before breakfast.   Yes [provider]  Psyllium (METAMUCIL FIBER PO) Take 1 Scoop by mouth See admin instructions. Mix 1 scoopful into 6-8 ounces of desired beverage and drink two times a day   Yes [provider]  sitaGLIPtin (JANUVIA) 25 MG tablet Take 1 tablet (25 mg total) by mouth daily. 04/04/19  Yes Donne Hazel, MD  tetrahydrozoline-zinc (VISINE-AC) 0.05-0.25 % ophthalmic solution Place 1 drop into both eyes 2 (two) times daily as needed (dry eyes).   Yes [provider]  traMADol (ULTRAM) 50 MG tablet Take 1 tablet (50 mg total) by mouth every 12 (twelve) hours as needed for moderate pain. 04/04/19  Yes Donne Hazel, MD  Triamcinolone Acetonide (NASACORT ALLERGY 24HR NA) Place 1 spray into both nostrils daily as needed (allergies).    Yes  [provider]  Trolamine Salicylate (ASPERCREME EX) Apply 1 application topically daily as needed (to affected areas, for joint pain).    Yes [provider]  vitamin B-12 (CYANOCOBALAMIN) 500 MCG tablet Take 500 mcg by mouth daily.   Yes [provider]  isosorbide-hydrALAZINE (BIDIL) 20-37.5 MG tablet Take 1 tablet by mouth 3 (three) times daily. Patient not taking: Reported on 06/27/2019 05/30/19 06/29/19  Arrien, Jimmy Picket, MD  metoprolol succinate (TOPROL-XL) 25 MG  24 hr tablet Take 0.5 tablets (12.5 mg total) by mouth daily. Patient not taking: Reported on 06/27/2019 05/31/19 06/30/19  Arrien, Jimmy Picket, MD  torsemide (DEMADEX) 20 MG tablet Take 3 tablets (60 mg total) by mouth 2 (two) times daily. Patient not taking: Reported on 06/27/2019 05/30/19 06/29/19  Arrien, Jimmy Picket, MD    Allergies    Kiwi extract, Lisinopril, and Plavix [clopidogrel]  Review of Systems   Review of Systems  Constitutional: Negative for chills and fever.  Respiratory: Positive for cough and shortness of breath. Negative for wheezing.   Cardiovascular: Positive for leg swelling. Negative for chest pain.  Gastrointestinal: Negative for abdominal pain, diarrhea, nausea and vomiting.  Genitourinary: Negative for dysuria.  All other systems reviewed and are negative.   Physical Exam Updated Vital Signs BP 133/87   Pulse (!) 106   Temp 97.6 F (36.4 C) (Oral)   Resp 19   Ht 5\' 6"  (1.676 m)   Wt 71.7 kg   SpO2 97%   BMI 25.50 kg/m   Physical Exam Vitals and nursing note reviewed.  Constitutional:      General: She is not in acute distress.    Appearance: She is not toxic-appearing.  HENT:     Head: Normocephalic.  Eyes:     Pupils: Pupils are equal, round, and reactive to light.  Cardiovascular:     Rate and Rhythm: Normal rate and regular rhythm.     Pulses: Normal pulses.     Heart sounds: Normal heart sounds. No murmur heard.  No friction rub. No  gallop.   Pulmonary:     Effort: Pulmonary effort is normal.     Breath sounds: Normal breath sounds.     Comments: Decreased air movement throughout.  Patient speaking in full sentences.  No accessory muscle usage. Abdominal:     General: Abdomen is flat. Bowel sounds are normal. There is no distension.     Palpations: Abdomen is soft.     Tenderness: There is no abdominal tenderness. There is no guarding or rebound.     Comments: Ileostomy in right lower quadrant  Musculoskeletal:     Cervical back: Neck supple.     Comments: 2+ pitting edema bilaterally from ankle to knees.  Skin:    General: Skin is warm and dry.  Neurological:     General: No focal deficit present.     Mental Status: She is alert.  Psychiatric:        Mood and Affect: Mood normal.        Behavior: Behavior normal.     ED Results / Procedures / Treatments   Labs (all labs ordered are listed, but only abnormal results are displayed) Labs Reviewed  CBC WITH DIFFERENTIAL/PLATELET - Abnormal; Notable for the following components:      Result Value   WBC 3.1 (*)    RBC 3.66 (*)    Hemoglobin 10.9 (*)    RDW 15.9 (*)    Lymphs Abs 0.4 (*)    All other components within normal limits  BASIC METABOLIC PANEL - Abnormal; Notable for the following components:   Sodium 133 (*)    CO2 16 (*)    Glucose, Bld 156 (*)    Creatinine, Ser 2.43 (*)    GFR calc non Af Amer 19 (*)    GFR calc Af Amer 22 (*)    All other components within normal limits  BRAIN NATRIURETIC PEPTIDE - Abnormal; Notable for the following components:  B Natriuretic Peptide 4,114.3 (*)    All other components within normal limits  I-STAT ARTERIAL BLOOD GAS, ED - Abnormal; Notable for the following components:   pH, Arterial 7.049 (*)    pCO2 arterial 62.9 (*)    pO2, Arterial 218 (*)    Bicarbonate 17.4 (*)    TCO2 19 (*)    Acid-base deficit 13.0 (*)    Sodium 133 (*)    HCT 34.0 (*)    Hemoglobin 11.6 (*)    All other components  within normal limits  SARS CORONAVIRUS 2 BY RT PCR (HOSPITAL ORDER, [place] [facility])  LACTIC ACID, PLASMA  LACTIC ACID, PLASMA    EKG EKG Interpretation  Date/Time:  Thursday June 27 2019 12:51:34 EDT Ventricular Rate:  106 PR Interval:    QRS Duration: 115 QT Interval:  428 QTC Calculation: 569 R Axis:   -179 Text Interpretation: Sinus tachycardia IRBBB and LPFB Probable anteroseptal infarct, recent Lateral leads are also involved Confirmed by Madalyn Rob [phone]) on 06/27/2019 1:50:19 PM   Radiology DG Chest Portable 1 View  Result Date: 06/27/2019 CLINICAL DATA:  Shortness of breath EXAM: PORTABLE CHEST 1 VIEW COMPARISON:  05/21/2019 FINDINGS: Cardiac shadow is enlarged. Aortic calcifications are noted. Right chest wall port is again seen and stable. Increasing bilateral pleural effusions is seen with likely underlying atelectasis. No acute bony abnormality is noted. IMPRESSION: Increasing bilateral pleural effusions. Electronically Signed   By: Inez Catalina M.D.   On: 06/27/2019 13:37    Procedures .Critical Care Performed by: Suzy Bouchard, PA-C Authorized by: Suzy Bouchard, PA-C   Critical care provider statement:    Critical care time (minutes):  45   Critical care time was exclusive of:  Separately billable procedures and treating other patients and teaching time   Critical care was necessary to treat or prevent imminent or life-threatening deterioration of the following conditions:  Respiratory failure   Critical care was time spent personally by me on the following activities:  Discussions with consultants, evaluation of patient's response to treatment, examination of patient, ordering and performing treatments and interventions, ordering and review of laboratory studies, ordering and review of radiographic studies, pulse oximetry, re-evaluation of patient's condition, obtaining history from patient or surrogate and review of old  charts   I assumed direction of critical care for this patient from another provider in my specialty: no     (including critical care time)  Medications Ordered in ED Medications  sodium chloride flush (NS) 0.9 % injection 10-40 mL (has no administration in time range)  sodium chloride flush (NS) 0.9 % injection 10-40 mL (has no administration in time range)  Chlorhexidine Gluconate Cloth 2 % PADS 6 each (has no administration in time range)  nitroGLYCERIN (NITROSTAT) SL tablet 0.4 mg (0.4 mg Sublingual Given 06/27/19 1528)  furosemide (LASIX) injection 40 mg (40 mg Intravenous Given 06/27/19 1526)  LORazepam (ATIVAN) injection 0.5 mg (0.5 mg Intravenous Given 06/27/19 1551)    ED Course  I have reviewed the triage vital signs and the nursing notes.  Pertinent labs & imaging results that were available during my care of the patient were reviewed by me and considered in my medical decision making (see chart for details).  Clinical Course as of Jun 27 1634  Thu Jun 27, 2019  1519 Spoke to patient's son, Harrell Gave, on the phone to discuss code status. Son unaware that patient is DNR. DNR confirmed in chart. Son is on his  way to the hospital.    [CA]  1609 B Natriuretic Peptide(!): 4,114.3 [CA]    Clinical Course User Index [CA] Suzy Bouchard, PA-C   MDM Rules/Calculators/A&P                         69 year old female presents to the ED due to worsening shortness of breath associated with bilateral lower extremity edema.  Patient recently admitted to the hospital for congestive heart failure exacerbation.  Denies fever, chills, sick contacts, and Covid exposures.  Not currently on any oxygen at home.  Upon arrival, patient afebrile, tachycardic at 106, but otherwise normal vitals.  Patient in no acute distress and nontoxic-appearing.  Exam significant for decreased air movement throughout lungs.  2+ pitting edema bilaterally up into bilateral knees.  Suspect symptoms related to  congestive heart failure exacerbation given patient's orthopnea and lower extremity edema.  Will obtain routine labs and BNP.  Also obtain chest x-ray to rule out pneumonia and signs of fluid overload.  Lower suspicion for PE given patient has been compliant with her Eliquis.  Chest x-ray personally reviewed which demonstrates increasing bilateral pleural effusions.  CBC significant for leukopenia at 3.1 and anemia with hemoglobin at 10.9 which appears better than baseline.  3:20 PM called to bedside by RN due to respiratory distress. [place] placed on patient. Dykstra and myself presented to bedside. Patient speaking in broken up sentences and working hard to breath. Patient is DNR and would not like to be intubated. Called respiratory to come evaluate patient at bedside. ABG, Lasix, and SL nitroglycerin ordered.  BNP elevated at 4114.  BMP significant for mild hyponatremia at 133 and elevation in creatinine at 2.43 which appears slightly better than baseline. ABG demonstrates mixed metabolic/respiratory acidosis. Will add lactic acid.  Will consult hospitalist for admission for acute on chronic congestive heart failure and respiratory distress.   Discussed case with Dr. Roosevelt Locks with [facility] who agrees to admit patient for further treatment.   Discussed case with Dr. Roslynn Amble who evaluated patient at bedside and agrees with assessment and plan. Final Clinical Impression(s) / ED Diagnoses Final diagnoses:  Acute on chronic systolic congestive heart failure [facility])  Respiratory distress    Rx / DC Orders ED Discharge Orders    None       Suzy Bouchard, PA-C 06/27/19 1638    Suzy Bouchard, PA-C 06/27/19 1642    Lucrezia Starch, MD 06/28/19 1740

## 2019-06-27 NOTE — Progress Notes (Signed)
Revisited the patient, son at bedside. Patient reports feeling much better. Patient has some concern about the cannot afford Eliquis which caused him more than $500 one month. Will consult case manager.

## 2019-06-27 NOTE — ED Notes (Signed)
hospitalist at bedside

## 2019-06-28 DIAGNOSIS — E440 Moderate protein-calorie malnutrition: Secondary | ICD-10-CM | POA: Diagnosis present

## 2019-06-28 LAB — BLOOD GAS, ARTERIAL
Acid-base deficit: 9.7 mmol/L — ABNORMAL HIGH (ref 0.0–2.0)
Acid-base deficit: 8.5 mmol/L — ABNORMAL HIGH (ref 0.0–2.0)
Bicarbonate: 16.2 mmol/L — ABNORMAL LOW (ref 20.0–28.0)
Bicarbonate: 16.3 mmol/L — ABNORMAL LOW (ref 20.0–28.0)
Drawn by: 330991
Drawn by: 406621
FIO2: 68
FIO2: 36
O2 Saturation: 97.9 %
O2 Saturation: 99.1 %
Patient temperature: 37
Patient temperature: 37
pCO2 arterial: 38.7 mmHg (ref 32.0–48.0)
pCO2 arterial: 31.9 mmHg — ABNORMAL LOW (ref 32.0–48.0)
pH, Arterial: 7.245 — ABNORMAL LOW (ref 7.350–7.450)
pH, Arterial: 7.329 — ABNORMAL LOW (ref 7.350–7.450)
pO2, Arterial: 113 mmHg — ABNORMAL HIGH (ref 83.0–108.0)
pO2, Arterial: 143 mmHg — ABNORMAL HIGH (ref 83.0–108.0)

## 2019-06-28 LAB — BASIC METABOLIC PANEL
Anion gap: 12 (ref 5–15)
BUN: 23 mg/dL (ref 8–23)
CO2: 17 mmol/L — ABNORMAL LOW (ref 22–32)
Calcium: 9.1 mg/dL (ref 8.9–10.3)
Chloride: 107 mmol/L (ref 98–111)
Creatinine, Ser: 2.41 mg/dL — ABNORMAL HIGH (ref 0.44–1.00)
GFR calc Af Amer: 23 mL/min — ABNORMAL LOW (ref >60)
GFR calc non Af Amer: 20 mL/min — ABNORMAL LOW (ref >60)
Glucose, Bld: 137 mg/dL — ABNORMAL HIGH (ref 70–99)
Potassium: 5.1 mmol/L (ref 3.5–5.1)
Sodium: 136 mmol/L (ref 135–145)

## 2019-06-28 LAB — LACTIC ACID, PLASMA: Lactic Acid, Venous: 1.7 mmol/L (ref 0.5–1.9)

## 2019-06-28 LAB — BLOOD GAS, VENOUS
Acid-base deficit: 8.8 mmol/L — ABNORMAL HIGH (ref 0.0–2.0)
Bicarbonate: 18.6 mmol/L — ABNORMAL LOW (ref 20.0–28.0)
FIO2: 21
O2 Saturation: 53.5 %
Patient temperature: 37
pCO2, Ven: 55.6 mmHg (ref 44.0–60.0)
pH, Ven: 7.15 — CL (ref 7.250–7.430)
pO2, Ven: 36.5 mmHg (ref 32.0–45.0)

## 2019-06-28 LAB — HEMOGLOBIN A1C
Hgb A1c MFr Bld: 5.8 % — ABNORMAL HIGH (ref 4.8–5.6)
Mean Plasma Glucose: 119.76 mg/dL

## 2019-06-28 MED ORDER — INSULIN ASPART 100 UNIT/ML ~~LOC~~ SOLN
0.0000 [IU] | Freq: Three times a day (TID) | SUBCUTANEOUS | Status: DC
  Administered 2019-06-29 – 2019-06-30 (×4): 1 [IU] via SUBCUTANEOUS
  Administered 2019-07-01: 2 [IU] via SUBCUTANEOUS
  Administered 2019-07-01 – 2019-07-02 (×3): 1 [IU] via SUBCUTANEOUS
  Administered 2019-07-02: 2 [IU] via SUBCUTANEOUS
  Administered 2019-07-02 – 2019-07-04 (×4): 1 [IU] via SUBCUTANEOUS

## 2019-06-28 MED ORDER — ENSURE ENLIVE PO LIQD
237.0000 mL | Freq: Two times a day (BID) | ORAL | Status: DC
  Administered 2019-06-28 – 2019-06-30 (×4): 237 mL via ORAL

## 2019-06-28 NOTE — Progress Notes (Signed)
   06/28/19 0500  Assess: MEWS Score  ECG Heart Rate (!) 107  Assess: MEWS Score  MEWS Temp 0  MEWS Systolic 0  MEWS Pulse 1  MEWS RR 0  MEWS LOC 1  MEWS Score 2  MEWS Score Color Yellow  Assess: if the MEWS score is Yellow or Red  Were vital signs taken at a resting state? Yes  Focused Assessment Documented focused assessment  Early Detection of Sepsis Score *See Row Information* Low  MEWS guidelines implemented *See Row Information* No, previously yellow, continue vital signs every 4 hours  Treat  MEWS Interventions Administered scheduled meds/treatments

## 2019-06-28 NOTE — Progress Notes (Signed)
ABG sent to lab, kim of lab notified at this time.

## 2019-06-28 NOTE — Progress Notes (Signed)
PROGRESS NOTE    Nicole Sellers  BOF:[id number] DOB: 1950/09/21 DOA: 06/27/2019 PCP: Reynold Bowen, MD    Chief Complaint  Patient presents with  . Shortness of Breath    Brief Narrative: History of stage III rectal cancer s/p chemotherapy and resection, hypertension, insulin-dependent diabetes mellitus, hypothyroidism, peripheral vascular disease, DVT, stage IV CKD presents to ED ED for worsening shortness of breath.  Patient admitted last month for acute CHF and was found to have a left ventricular ejection fraction of 20 to 25% with severe diffuse global hypokinesis.  Patient at that time refused further ischemic work-up and was discharged home with hospice.  She presents this time with acute respiratory failure with hypoxia and respiratory acidosis and she was started on BiPAP overnight.  She was transitioned to high flow nasal cannula oxygen this morning and repeat ABG shows improvement in the pH from 7.2-7.3. Palliative care consulted for goals of care discussion.  Assessment & Plan:   Active Problems:   CHF (congestive heart failure) (HCC)   Malnutrition of moderate degree   Acute respiratory failure with respiratory and metabolic acidosis secondary to acute on chronic systolic heart failure. Patient was started on IV Lasix 40 mg twice daily.  She was initially started on BiPAP and transition to 10 L of high flow oxygen. Continue with strict intake and output and daily weights.  In view of her multiple medical comorbidities, poor prognosis, deconditioning and debility and and clinical deterioration, palliative care consulted for repeat goals of care discussion.   Essential hypertension Initially uncontrolled currently on Coreg and Lasix.   Stage III rectal cancer s/p chemoradiation and resection. Patient has ostomy in place follow-up with oncology as outpatient.    Hypothyroidism Continue with Synthroid.   History of DVT continue with Eliquis.   Moderate  malnutrition Nutritionist consulted and recommendations given.   Hyperlipidemia Continue with the Lipitor 10 mg daily   Diabetes mellitus Continue with sliding scale insulin while inpatient.   DVT prophylaxis: Lovenox Code Status: DNR Family Communication: None at bedside disposition:   Status is: Inpatient  Remains inpatient appropriate because:IV treatments appropriate due to intensity of illness or inability to take PO   Dispo: The patient is from: Home              Anticipated d/c is to: Pending              Anticipated d/c date is: 3 days              Patient currently is not medically stable to d/c.       Consultants:   Palliative care consult  Procedures: none    Antimicrobials: None   Subjective: Patient reports breathing is better than yesterday but she is not back to baseline  Objective: Vitals:   06/28/19 0723 06/28/19 0839 06/28/19 1357 06/28/19 1505  BP:  124/78  115/68  Pulse:  (!) 117  100  Resp:  20  18  Temp:  97.8 F (36.6 C)  98.4 F (36.9 C)  TempSrc:  Oral  Oral  SpO2: 96% 99% 100% 100%  Weight:      Height:        Intake/Output Summary (Last 24 hours) at 06/28/2019 1558 Last data filed at 06/28/2019 0200 Gross per 24 hour  Intake --  Output 350 ml  Net -350 ml   Filed Weights   06/27/19 1245 06/27/19 2147  Weight: 71.7 kg 64.6 kg    Examination:  General exam:  Appears calm , in  Mild distress from sob.  Respiratory system: Scattered wheeze bilaterally, diminished air entry at bases, tachypnea present, on 10 L of high flow oxygen. Cardiovascular system: S1-S2 heard, tachycardic, no JVD, pedal edema present Gastrointestinal system: Diminished soft, nontender, bowel sounds normal Central nervous system: Alert and oriented. No focal neurological deficits. Extremities: Symmetric 5 x 5 power. Skin: No rashes, lesions or ulcers Psychiatry:  Mood & affect appropriate.     Data Reviewed: I have personally reviewed following  labs and imaging studies  CBC: Recent Labs  Lab 06/27/19 1425 06/27/19 1527  WBC 3.1*  --   NEUTROABS 2.5  --   HGB 10.9* 11.6*  HCT 36.2 34.0*  MCV 98.9  --   PLT 379  --     Basic Metabolic Panel: Recent Labs  Lab 06/27/19 1425 06/27/19 1527 06/28/19 0730  NA 133* 133* 136  K 4.7 5.0 5.1  CL 106  --  107  CO2 16*  --  17*  GLUCOSE 156*  --  137*  BUN 23  --  23  CREATININE 2.43*  --  2.41*  CALCIUM 9.2  --  9.1    GFR: Estimated Creatinine Clearance: 20 mL/min (A) (by C-G formula based on SCr of 2.41 mg/dL (H)).  Liver Function Tests: No results for input(s): AST, ALT, ALKPHOS, BILITOT, PROT, ALBUMIN in the last 168 hours.  CBG: No results for input(s): GLUCAP in the last 168 hours.   Recent Results (from the past 240 hour(s))  SARS Coronavirus 2 by RT PCR (hospital order, performed in [facility] hospital lab) Nasopharyngeal Nasopharyngeal Swab     Status: None   Collection Time: 06/27/19  3:20 PM   Specimen: Nasopharyngeal Swab  Result Value Ref Range Status   SARS Coronavirus 2 NEGATIVE NEGATIVE Final    Comment: (NOTE) SARS-CoV-2 target nucleic acids are NOT DETECTED.  The SARS-CoV-2 RNA is generally detectable in upper and lower respiratory specimens during the acute phase of infection. The lowest concentration of SARS-CoV-2 viral copies this assay can detect is 250 copies / mL. A negative result does not preclude SARS-CoV-2 infection and should not be used as the sole basis for treatment or other patient management decisions.  A negative result may occur with improper specimen collection / handling, submission of specimen other than nasopharyngeal swab, presence of viral mutation(s) within the areas targeted by this assay, and inadequate number of viral copies (<250 copies / mL). A negative result must be combined with clinical observations, patient history, and epidemiological information.  Fact Sheet for Patients:     StrictlyIdeas.no  Fact Sheet for Healthcare Providers: BankingDealers.co.za  This test is not yet approved or  cleared by the [place] FDA and has been authorized for detection and/or diagnosis of SARS-CoV-2 by FDA under an Emergency Use Authorization (EUA).  This EUA will remain in effect (meaning this test can be used) for the duration of the COVID-19 declaration under Section 564(b)(1) of the Act, 21 U.S.C. section 360bbb-3(b)(1), unless the authorization is terminated or revoked sooner.  Performed at [facility], [place] [address] [place], [place] [address]          Radiology Studies: DG Chest Portable 1 View  Result Date: 06/27/2019 CLINICAL DATA:  Shortness of breath EXAM: PORTABLE CHEST 1 VIEW COMPARISON:  05/21/2019 FINDINGS: Cardiac shadow is enlarged. Aortic calcifications are noted. Right chest wall port is again seen and stable. Increasing bilateral pleural effusions is seen with likely underlying  atelectasis. No acute bony abnormality is noted. IMPRESSION: Increasing bilateral pleural effusions. Electronically Signed   By: Inez Catalina M.D.   On: 06/27/2019 13:37        Scheduled Meds: . apixaban  5 mg Oral BID  . aspirin EC  81 mg Oral Daily  . atorvastatin  10 mg Oral QHS  . calcium-vitamin D  1 tablet Oral Daily  . carvedilol  3.125 mg Oral BID WC  . Chlorhexidine Gluconate Cloth  6 each Topical Daily  . cholecalciferol  4,000 Units Oral Daily  . feeding supplement (ENSURE ENLIVE)  237 mL Oral BID BM  . furosemide  40 mg Intravenous BID  . gabapentin  300 mg Oral BID  . hydrALAZINE  50 mg Oral Q6H  . ipratropium  0.5 mg Nebulization Q6H  . iron polysaccharides  150 mg Oral BID  . isosorbide mononitrate  30 mg Oral Daily  . levothyroxine  50 mcg Oral Q0600  . linagliptin  5 mg Oral Daily  . loratadine  10 mg Oral Daily  . pantoprazole  40 mg Oral QAC breakfast  . sodium bicarbonate  650 mg Oral  TID  . sodium chloride flush  10-40 mL Intracatheter Q12H  . sodium chloride flush  3 mL Intravenous Q12H  . triamcinolone  1 spray Each Nare Daily  . vitamin B-12  500 mcg Oral Daily   Continuous Infusions: . sodium chloride       LOS: 1 day        Hosie Poisson, MD [facility]   To contact the attending provider between 7A-7P or the covering provider during after hours 7P-7A, please log into the web site [email or web address] and access using universal [place] password for that web site. If you do not have the password, please call the hospital operator.  06/28/2019, 3:58 PM

## 2019-06-28 NOTE — Progress Notes (Signed)
   06/28/19 0047  Assess: MEWS Score  Temp (!) 97.4 F (36.3 C)  BP (!) 142/87  Pulse Rate (!) 115  Resp 18  SpO2 100 %  O2 Device Bi-PAP  FiO2 (%) 40 %  Assess: MEWS Score  MEWS Temp 0  MEWS Systolic 0  MEWS Pulse 2  MEWS RR 0  MEWS LOC 1  MEWS Score 3  MEWS Score Color Yellow  Assess: if the MEWS score is Yellow or Red  Were vital signs taken at a resting state? Yes (pt on bipap )  Focused Assessment Documented focused assessment  Early Detection of Sepsis Score *See Row Information* Low  MEWS guidelines implemented *See Row Information* No, previously yellow, continue vital signs every 4 hours  Treat  MEWS Interventions Administered scheduled meds/treatments;Administered prn meds/treatments  Document  Patient Outcome Stabilized after interventions

## 2019-06-28 NOTE — Progress Notes (Signed)
Pt taken off BIPAP at this time, placed on 11L HFNC. Pt still with fine crackles bilaterally, but appears somewhat comfortable. RT will continue to monitor.

## 2019-06-28 NOTE — Progress Notes (Signed)
   06/28/19 0703  Assess: MEWS Score  ECG Heart Rate (!) 118  Assess: MEWS Score  MEWS Temp 0  MEWS Systolic 0  MEWS Pulse 2  MEWS RR 0  MEWS LOC 1  MEWS Score 3  MEWS Score Color Yellow  Assess: if the MEWS score is Yellow or Red  Were vital signs taken at a resting state? Yes  Focused Assessment Documented focused assessment  Early Detection of Sepsis Score *See Row Information* Low  MEWS guidelines implemented *See Row Information* No, previously yellow, continue vital signs every 4 hours  Treat  MEWS Interventions Administered scheduled meds/treatments  Document  Patient Outcome Stabilized after interventions

## 2019-06-28 NOTE — TOC Initial Note (Signed)
Transition of Care [facility]) - Initial/Assessment Note    Patient Details  Name: Nicole Sellers MRN: [id number] Date of Birth: 03-05-1950  [facility]) CM/SW Contact:    Verdell Carmine, RN Phone Number: 06/28/2019, 5:41 PM  Clinical Narrative:                 Patient met with Palliative care. Would like to go home with hospice, but has had [facility] in the past and they just brought DME to the home and then did not follow up. She wants a hospice company to come up and discuss what they are going to do with her. Did text [place]. M Ferolito placing orders in from [employer].   Expected Discharge Plan: Home w Hospice Care Barriers to Discharge: Continued Medical Work up   Patient Goals and CMS Choice Patient states their goals for this hospitalization and ongoing recovery are:: HOme with HOspice as long as its known what to expect      Expected Discharge Plan and Services Expected Discharge Plan: Home w Hospice Care In-house Referral: Clinical Social Work Discharge Planning Services: CM Consult                                          Prior Living Arrangements/Services                  Current home services: Hospice    Activities of Daily Living      Permission Sought/Granted   Permission granted to share information with : Yes, Verbal Permission Granted     Permission granted to share info w AGENCY: [facility]        Emotional Assessment   Attitude/Demeanor/Rapport: Engaged, Gracious Affect (typically observed): Calm Orientation: : Oriented to Self, Oriented to Place, Oriented to  Time, Oriented to Situation Alcohol / Substance Use: Not Applicable Psych Involvement: No (comment)  Admission diagnosis:  CHF (congestive heart failure) (HCC) [I50.9] Respiratory distress [R06.03] Acute on chronic systolic congestive heart failure (HCC) [I50.23] Patient Active Problem List   Diagnosis Date Noted  . Malnutrition  of moderate degree 06/28/2019  . CHF (congestive heart failure) ([place]) 06/27/2019  . Respiratory distress   . Stage 5 chronic kidney disease not on chronic dialysis ([place])   . Palliative care by specialist   . Goals of care, counseling/discussion   . General weakness   . CKD (chronic kidney disease) stage 4, GFR 15-29 ml/min (HCC) 05/21/2019  . Fluid overload 05/21/2019  . Acute congestive heart failure ([place]) 05/21/2019  . Anemia of chronic disease 05/21/2019  . Transaminitis 05/21/2019  . Acute kidney injury ([place]) 03/29/2019  . DVT (deep venous thrombosis) ([place]) 03/29/2019  . Oral candidiasis   . AKI (acute kidney injury) ([place]) 10/01/2018  . Hypokalemia 10/01/2018  . Pyuria 10/01/2018  . Odynophagia 10/01/2018  . Hyponatremia 10/01/2018  . Right sided weakness 10/01/2018  . SBO (small bowel obstruction) ([place]) 08/22/2018  . Dehydration 08/21/2018  . Pre-operative cardiovascular examination 06/07/2018  . Rectal cancer ([place]) 11/06/2017  . Claudication in peripheral vascular disease ([place]) 05/08/2017  . Carotid artery disease ([place]) 04/14/2017  . PAD (peripheral artery disease) Rt ABI 0.7, Lt ABI 0.46 12/25/2012  . S/P arterial stent, 12/25/13, successful diamondback orbital rotational arthrectomy, PTA using chocolate  balloon and stenting using I DEV stent of long segment calcified high-grade proximal and mid r  12/25/2012  . Gangrene of toe, Rt second toe 12/25/2012  . Essential hypertension 12/13/2012  . Non-insulin treated type 2 diabetes mellitus ([place]) 12/13/2012  . Critical lower limb ischemia 12/13/2012  . Tobacco abuse 12/13/2012   PCP:  Reynold Bowen, MD Pharmacy:   [id number] [place] [id number]: [phone]: [phone]    Social Determinants of Health (SDOH) Interventions    Readmission Risk Interventions Readmission Risk Prevention Plan  06/28/2019 05/22/2019 05/22/2019  Transportation Screening - - Complete  Medication Review (RN Care Manager) Complete - Complete  PCP or Specialist appointment within 3-5 days of discharge - - Complete  PCP/Specialist Appt Not Complete comments - - -  HRI or Home Care Consult - - Complete  SW Recovery Care/Counseling Consult - - Complete  SW Consult Not Complete Comments - - -  Palliative Care Screening - Complete [place]- - Not Applicable  Some recent data might be hidden

## 2019-06-28 NOTE — Progress Notes (Signed)
Initial Nutrition Assessment  DOCUMENTATION CODES:   Non-severe (moderate) malnutrition in context of chronic illness  INTERVENTION:   -Ensure Enlive po BID, each supplement provides 350 kcal and 20 grams of protein -Magic cup TID with meals, each supplement provides 290 kcal and 9 grams of protein -Downgrade diet to dysphagia 2 diet with thin liquids for ease of intake  NUTRITION DIAGNOSIS:   Moderate Malnutrition related to chronic illness (CKD stage IV, CHF) as evidenced by percent weight loss, energy intake < 75% for > or equal to 1 month, mild fat depletion, moderate fat depletion, moderate muscle depletion, severe muscle depletion, edema.  GOAL:   Patient will meet greater than or equal to 90% of their needs  MONITOR:   PO intake, Supplement acceptance, Labs, Weight trends, Skin, I & O's  REASON FOR ASSESSMENT:   Malnutrition Screening Tool    ASSESSMENT:   Nicole Sellers is a 70 y.o. female with medical history significant of stage III rectal cancer status post chemotherapy, radiation and resection, hypertension, IIDM, hypothyroidism, peripheral vascular disease, deep vein thrombosis and chronic kidney disease stage IV, presented with increasing short of breath for 7 days.  Patient was recently hospitalized in May when she was found to have a new onset of systolic CHF with LVEF 20 to 25% with severe diffuse global hypokinesis.  However patient refused further ischemia work-up, and was discharged on May 20.  Upon discharge, patient was supposed to take torsemide 60 mg twice daily, however patient told me that she has been only take torsemide 20 mg 3 times a week started on June 7, patient however cannot provide me with more details regarding my such a change was made. Pt admitted with CHF.   Reviewed I/O's: -350 ml x 24 hours  UOP: 350 ml x 24 hours  Spoke with pt at bedside, who reports poor appetite" for a long time". She reports she typically eats one meal per day,  which consists of chicken and rice or chicken and noodles. Per pt, "I can't eat much of anything, because I can't eat it". Noted pt with multiple missing teeth; she typically uses dentures when eating, but does not currently have access to them.   Pt reports weight loss due to poor oral intake. She is unsure of UBW or who much weight she has lost. Reviewed wt hx; pt has experienced a 9.2% wt loss over the past 3 months, which is significant for time frame.   Discussed importance of good meal and supplement intake to promote healing. Visit was cut short due to need for breathing treatment.   Medications reviewed and include vitamin D3 and lasix.    Lab Results  Component Value Date   HGBA1C 5.4 03/29/2019   PTA DM medications are 25 mg [place] daily.   Labs reviewed. Inpatient orders for glycemic control are 5 mg linagliptin daily).   NUTRITION - FOCUSED PHYSICAL EXAM:    Most Recent Value  Orbital Region Moderate depletion  Upper Arm Region Moderate depletion  Thoracic and Lumbar Region Mild depletion  Buccal Region Mild depletion  Temple Region Moderate depletion  Clavicle Bone Region Moderate depletion  Clavicle and Acromion Bone Region Moderate depletion  Scapular Bone Region Moderate depletion  Dorsal Hand Moderate depletion  Patellar Region Mild depletion  Anterior Thigh Region Mild depletion  Posterior Calf Region Mild depletion  Edema (RD Assessment) Moderate  Hair Reviewed  Eyes Reviewed  Mouth Reviewed  Skin Reviewed  Nails Reviewed  Diet Order:   Diet Order            DIET DYS 2 Room service appropriate? Yes with Assist; Fluid consistency: Thin  Diet effective now                 EDUCATION NEEDS:   No education needs have been identified at this time  Skin:  Skin Assessment: Reviewed RN Assessment  Last BM:  06/28/19  Height:   Ht Readings from Last 1 Encounters:  06/27/19 5\' 6"  (1.676 m)    Weight:   Wt Readings from Last 1 Encounters:   06/27/19 64.6 kg    Ideal Body Weight:  59.1 kg  BMI:  Body mass index is 22.98 kg/m.  Estimated Nutritional Needs:   Kcal:  1900-2100  Protein:  95-110 grams  Fluid:  > 1.9 L    Loistine Chance, RD, LDN, [place] Registered Dietitian II Certified Diabetes Care and Education Specialist Please refer to [facility] for RD and/or RD on-call/weekend/after hours pager

## 2019-06-28 NOTE — Progress Notes (Signed)
Pt has a hx of sleep apnea, may need to be on cpap at night, or continue the bipap at night, pt has had appts to go to a sleep study in the past, but has not followed thru, may need to go to a sleep study again, to assess her sleep apnea, please address, will continue to monitor, Thanks Arvella Nigh RN

## 2019-06-28 NOTE — Consult Note (Signed)
Palliative Medicine Inpatient Consult Note  Reason for consult:  Goals of care "End Stage Respiratory Failure"  HPI:  Per intake H&P --> Nicole Sellers is a 69 y.o. female with medical history significant of stage III rectal cancer status post chemotherapy, radiation and resection, hypertension, IIDM, hypothyroidism, peripheral vascular disease, deep vein thrombosis and chronic kidney disease stage IV, presented with increasing short of breath for 7 days.  Patient was recently hospitalized in May when she was found to have a new onset of systolic CHF with LVEF 20 to 25% with severe diffuse global hypokinesis.  However patient refused further ischemia work-up, and was discharged on May 20.  Upon discharge, patient was supposed to take torsemide 60 mg twice daily, however patient told me that she has been only take torsemide 20 mg 3 times a week started on June 7, patient however cannot provide me with more details regarding my such a change was made.  Palliative care has seen [name] in the past for goals of care. It appears that she had declined hospice on prior occassions. She has a known mistrust for the healthcare system.   Clinical Assessment/Goals of Care: I have reviewed medical records including EPIC notes, labs and imaging, received report from bedside RN, assessed the patient.    I met with [name]. Pearce and her brother, [name] to further discuss diagnosis prognosis, GOC, EOL wishes, disposition and options.   I introduced Palliative Medicine as specialized medical care for people living with serious illness. It focuses on providing relief from the symptoms and stress of a serious illness. The goal is to improve quality of life for both the patient and the family.  I asked Nicole Sellers to tell me about herself. She shares that she is from [place], [place]. She has though lived in the [place] area for the past twenty two years. She was married "may years ago" and has one  son, Nicole Sellers. She states that she loves spending time with her family, "grandchildren and great grandchildren bring me joy". She is a faithful woman and a member of the [employer].  I asked Zenola where she was living presently. She said that she lives in the [facility]. She uses a walker and a can for mobility otherwise she is able to perform all of her bADLs independently. She does share that she wishes she had more help in her home.  I asked Keondra how her health as been recently. She states that "I need some help". I asked her what she meant and she shared that she has multiple chronic illnesses including, DMII, CKD, and CHF. She said that it is getting harder to do things and would like additional care services during the day. I told her that I plan to reach out to our social work team.   A detailed discussion was had today regarding advanced directives, patient vocalized that she would want her son, Nicole Sellers to make decisions if she were unable to.    Concepts specific to code status, artifical feeding and hydration, continued IV antibiotics and rehospitalization was had.  We reviewed [name]'s prior MOST form as below:  Cardiopulmonary Resuscitation: Do Not Attempt Resuscitation (DNR/No CPR)  Medical Interventions: Limited Additional Interventions: Use medical treatment, IV fluids and cardiac monitoring as indicated, DO NOT USE intubation or mechanical ventilation. May consider use of less invasive airway support such as BiPAP or CPAP. Also provide comfort measures. Transfer to the hospital if indicated. Avoid intensive care.  Antibiotics: Determine use of limitation of antibiotics when infection occurs  IV Fluids: IV fluids for a defined trial period  Feeding Tube: No feeding tube   These wishes for care remain the same.   The difference between a aggressive medical intervention path  and a palliative comfort care path for this patient at this time was had. Berdine said "a  hospice company brought all the equipment and never returned". She states that she just wanted clarity to understand hospice services and this was never done. We talked about her appropriateness for hospice in the setting of her chronic co-mobidities, increasing hospitalizations, and overall poor health state. She said that she is interested in hospice just not [facility] who had seen her prior.   Discussed the importance of continued conversation with family and their  medical providers regarding overall plan of care and treatment options, ensuring decisions are within the context of the patients values and GOCs.  Decision Maker: [name] (Son) [phone]  SUMMARY OF RECOMMENDATIONS    DNR/DNI  MOST in [place]  TOC --> Consult for [facility]  PMT will continue to support and follow  Code Status/Advance Care Planning: DNAR/DNI   Palliative Prophylaxis:   Oral Care, Turn Q2H, Constipation  Additional Recommendations (Limitations, Scope, Preferences):  Treat what is treatable   Psycho-social/Spiritual:   Desire for further Chaplaincy support:Yes   Additional Recommendations: Education on hospice   Prognosis: Poor in the setting of CHF and CKD with worsening debility and increased hospitalizations  Discharge Planning: Discharge plan unclear presently.   PPS: 50%   This conversation/these recommendations were discussed with patient primary care team, [name]  Time In: 1700 Time Out: 1815 Total Time: 75 Greater than 50%  of this time was spent counseling and coordinating care related to the above assessment and plan.  [place] Team Team Cell Phone: [phone] Please utilize secure chat with additional questions, if there is no response within 30 minutes please call the above phone number  Palliative Medicine Team providers are available by phone from 7am to 7pm daily and can be reached through the  team cell phone.  Should this patient require assistance outside of these hours, please call the patient's attending physician.

## 2019-06-28 NOTE — Progress Notes (Signed)
   06/28/19 0435  Assess: MEWS Score  Temp 97.6 F (36.4 C)  BP (!) 148/97  Pulse Rate (!) 115  Resp 20  O2 Device Nasal Cannula  O2 Flow Rate (L/min) 15 L/min  Assess: MEWS Score  MEWS Temp 0  MEWS Systolic 0  MEWS Pulse 2  MEWS RR 0  MEWS LOC 1  MEWS Score 3  MEWS Score Color Yellow  Assess: if the MEWS score is Yellow or Red  Were vital signs taken at a resting state? Yes  Focused Assessment Documented focused assessment  Early Detection of Sepsis Score *See Row Information* Low  MEWS guidelines implemented *See Row Information* No, previously yellow, continue vital signs every 4 hours  Treat  MEWS Interventions Administered scheduled meds/treatments;Administered prn meds/treatments  Document  Patient Outcome Other (Comment) (sat pt upright, and changed her HFNC to 15)

## 2019-06-28 NOTE — Progress Notes (Signed)
   06/28/19 0234  Assess: MEWS Score  Pulse Rate (!) 115  Resp 20  SpO2 100 %  O2 Device HFNC  O2 Flow Rate (L/min) 11 L/min  Assess: MEWS Score  MEWS Temp 0  MEWS Systolic 0  MEWS Pulse 2  MEWS RR 0  MEWS LOC 1  MEWS Score 3  MEWS Score Color Yellow  Assess: if the MEWS score is Yellow or Red  Were vital signs taken at a resting state? Yes  Focused Assessment Documented focused assessment  Early Detection of Sepsis Score *See Row Information* Low  MEWS guidelines implemented *See Row Information* No, previously yellow, continue vital signs every 4 hours  Treat  MEWS Interventions Administered scheduled meds/treatments (pt changed to HFNC at 11 L )  Document  Patient Outcome Stabilized after interventions

## 2019-06-28 NOTE — Progress Notes (Signed)
Chaplain responded to a Spiritual Consult request for prayer.  Chaplain established a relationship of care and concern for Mrs. Herren by asking her what she would like for [place] to pray about this evening.  We had a lovely time of prayer with her son in the room who joined [place] in prayer. Chaplain is available for follow up visit.  De Burrs Chaplain Resident

## 2019-06-28 NOTE — Progress Notes (Signed)
   06/28/19 0600  Assess: MEWS Score  ECG Heart Rate (!) 116  Assess: MEWS Score  MEWS Temp 0  MEWS Systolic 0  MEWS Pulse 2  MEWS RR 0  MEWS LOC 1  MEWS Score 3  MEWS Score Color Yellow  Assess: if the MEWS score is Yellow or Red  Were vital signs taken at a resting state? Yes  Focused Assessment Documented focused assessment  Early Detection of Sepsis Score *See Row Information* Low  MEWS guidelines implemented *See Row Information* No, previously yellow, continue vital signs every 4 hours  Treat  MEWS Interventions Administered scheduled meds/treatments  Document  Patient Outcome Stabilized after interventions

## 2019-06-29 LAB — GLUCOSE, CAPILLARY
Glucose-Capillary: 117 mg/dL — ABNORMAL HIGH (ref 70–99)
Glucose-Capillary: 146 mg/dL — ABNORMAL HIGH (ref 70–99)
Glucose-Capillary: 134 mg/dL — ABNORMAL HIGH (ref 70–99)
Glucose-Capillary: 144 mg/dL — ABNORMAL HIGH (ref 70–99)

## 2019-06-29 LAB — BASIC METABOLIC PANEL
Anion gap: 7 (ref 5–15)
BUN: 26 mg/dL — ABNORMAL HIGH (ref 8–23)
CO2: 21 mmol/L — ABNORMAL LOW (ref 22–32)
Calcium: 8.5 mg/dL — ABNORMAL LOW (ref 8.9–10.3)
Chloride: 108 mmol/L (ref 98–111)
Creatinine, Ser: 2.41 mg/dL — ABNORMAL HIGH (ref 0.44–1.00)
GFR calc Af Amer: 23 mL/min — ABNORMAL LOW (ref >60)
GFR calc non Af Amer: 20 mL/min — ABNORMAL LOW (ref >60)
Glucose, Bld: 127 mg/dL — ABNORMAL HIGH (ref 70–99)
Potassium: 4.5 mmol/L (ref 3.5–5.1)
Sodium: 136 mmol/L (ref 135–145)

## 2019-06-29 MED ORDER — IPRATROPIUM BROMIDE 0.02 % IN SOLN
0.5000 mg | Freq: Three times a day (TID) | RESPIRATORY_TRACT | Status: DC
  Administered 2019-06-29 – 2019-06-30 (×2): 0.5 mg via RESPIRATORY_TRACT
  Filled 2019-06-29 (×3): qty 2.5

## 2019-06-29 NOTE — Discharge Instructions (Signed)
Information on my medicine - ELIQUIS (apixaban)  This medication education was reviewed with me or my healthcare representative as part of my discharge preparation.    Why was Eliquis prescribed for you? Eliquis was prescribed for you to reduce the risk of forming blood clots that can cause a stroke due to your history of blood clots.  What do You need to know about Eliquis ? Take your Eliquis TWICE DAILY - one tablet in the morning and one tablet in the evening with or without food.  It would be best to take the doses about the same time each day.  If you have difficulty swallowing the tablet whole please discuss with your pharmacist how to take the medication safely.  Take Eliquis exactly as prescribed by your doctor and DO NOT stop taking Eliquis without talking to the doctor who prescribed the medication.  Stopping may increase your risk of developing a new clot or stroke.  Refill your prescription before you run out.  After discharge, you should have regular check-up appointments with your healthcare provider that is prescribing your Eliquis.  In the future your dose may need to be changed if your kidney function or weight changes by a significant amount or as you get older.  What do you do if you miss a dose? If you miss a dose, take it as soon as you remember on the same day and resume taking twice daily.  Do not take more than one dose of ELIQUIS at the same time.  Important Safety Information A possible side effect of Eliquis is bleeding. You should call your healthcare provider right away if you experience any of the following: ? Bleeding from an injury or your nose that does not stop. ? Unusual colored urine (red or dark brown) or unusual colored stools (red or black). ? Unusual bruising for unknown reasons. ? A serious fall or if you hit your head (even if there is no bleeding).  Some medicines may interact with Eliquis and might increase your risk of bleeding or  clotting while on Eliquis. To help avoid this, consult your healthcare provider or pharmacist prior to using any new prescription or non-prescription medications, including herbals, vitamins, non-steroidal anti-inflammatory drugs (NSAIDs) and supplements.  This website has more information on Eliquis (apixaban): [email or web address].

## 2019-06-29 NOTE — Progress Notes (Signed)
PROGRESS NOTE    Nicole Sellers  NGE:[id number] DOB: 1950/10/24 DOA: 06/27/2019 PCP: Reynold Bowen, MD    Chief Complaint  Patient presents with  . Shortness of Breath    Brief Narrative: History of stage III rectal cancer s/p chemotherapy and resection, hypertension, insulin-dependent diabetes mellitus, hypothyroidism, peripheral vascular disease, DVT, stage IV CKD presents to ED ED for worsening shortness of breath.  Patient admitted last month for acute CHF and was found to have a left ventricular ejection fraction of 20 to 25% with severe diffuse global hypokinesis.  Patient at that time refused further ischemic work-up and was discharged home with hospice.  She presents this time with acute respiratory failure with hypoxia and respiratory acidosis and she was started on BiPAP overnight.  She was transitioned to high flow nasal cannula oxygen this morning and repeat ABG shows improvement in the pH from 7.2-7.3. Palliative care consulted for goals of care discussion. Pt seen and examined at bedside, we started weaning her oxygen and currently she is at 9 lit of HF oxygen.   Assessment & Plan:   Active Problems:   CHF (congestive heart failure) (HCC)   Malnutrition of moderate degree   Acute respiratory failure with respiratory and metabolic acidosis secondary to acute on chronic systolic heart failure. Patient was started on IV Lasix 40 mg twice daily.  She was initially started on BiPAP and transition to 10 L of high flow oxygen.no t much urine output overnight, but pt appears to be clinically improving, probably from the use the of BIpap and lasix.  Continue with strict intake and output and daily weights. Plan to wean her oxygen down in the next 24 to 48 hours and patient would like to go home with hospice when she is medically ready.    In view of her multiple medical comorbidities, poor prognosis, deconditioning and debility and and clinical deterioration, palliative care  consulted for repeat goals of care discussion.   Essential hypertension Better controlled today.  Resume home meds. Of coreg, isosorbide mononitrate.    Stage III rectal cancer s/p chemoradiation and resection. Patient has ostomy in place follow-up with oncology as outpatient. No new complaints.     Hypothyroidism Continue with Synthroid.   History of DVT  continue with Eliquis.   Moderate malnutrition Nutritionist consulted and recommendations given.   Hyperlipidemia Continue with the Lipitor 10 mg daily   Diabetes mellitus Continue with sliding scale insulin while inpatient. CBG (last 3)  Recent Labs    06/29/19 0623 06/29/19 1418  GLUCAP 117* 146*    Stage 4 CKD:  Creatinine at 2.4, and stable.    DVT prophylaxis: Lovenox Code Status: DNR Family Communication: None at bedside  disposition:   Status is: Inpatient  Remains inpatient appropriate because:IV treatments appropriate due to intensity of illness or inability to take PO   Dispo: The patient is from: Home              Anticipated d/c is to: Pending              Anticipated d/c date is: 3 days              Patient currently is not medically stable to d/c.       Consultants:   Palliative care consult  Procedures: none    Antimicrobials: None   Subjective: Improving breathing, no chest pain or cough.   Objective: Vitals:   06/29/19 0541 06/29/19 0729 06/29/19 1151 06/29/19 1353  BP:  115/68   Pulse:   97   Resp:   20   Temp:   98.5 F (36.9 C)   TempSrc:   Oral   SpO2: 100% 99% 100% 98%  Weight:      Height:        Intake/Output Summary (Last 24 hours) at 06/29/2019 1520 Last data filed at 06/29/2019 1300 Gross per 24 hour  Intake 592.5 ml  Output 700 ml  Net -107.5 ml   Filed Weights   06/27/19 1245 06/27/19 2147 06/29/19 0207  Weight: 71.7 kg 64.6 kg 65.6 kg    Examination:  General exam: calm, not in distress, on 9 lit of [place] oxygen.  Respiratory system:  no wheezing heard, tachypnea,  Cardiovascular system: S1S2, RRR, no JVD, no pedal edema.  Gastrointestinal system: abd  is soft, non tender non distended, bowel sounds.  Central nervous system: alert and oriented, non focal.  Extremities: no pedal edema.  Skin: no rashes.  Psychiatry:  Mood is appropriate.     Data Reviewed: I have personally reviewed following labs and imaging studies  CBC: Recent Labs  Lab 06/27/19 1425 06/27/19 1527  WBC 3.1*  --   NEUTROABS 2.5  --   HGB 10.9* 11.6*  HCT 36.2 34.0*  MCV 98.9  --   PLT 379  --     Basic Metabolic Panel: Recent Labs  Lab 06/27/19 1425 06/27/19 1527 06/28/19 0730 06/29/19 0324  NA 133* 133* 136 136  K 4.7 5.0 5.1 4.5  CL 106  --  107 108  CO2 16*  --  17* 21*  GLUCOSE 156*  --  137* 127*  BUN 23  --  23 26*  CREATININE 2.43*  --  2.41* 2.41*  CALCIUM 9.2  --  9.1 8.5*    GFR: Estimated Creatinine Clearance: 20 mL/min (A) (by C-G formula based on SCr of 2.41 mg/dL (H)).  Liver Function Tests: No results for input(s): AST, ALT, ALKPHOS, BILITOT, PROT, ALBUMIN in the last 168 hours.  CBG: Recent Labs  Lab 06/29/19 0623 06/29/19 1418  GLUCAP 117* 146*     Recent Results (from the past 240 hour(s))  SARS Coronavirus 2 by RT PCR (hospital order, performed in [facility] hospital lab) Nasopharyngeal Nasopharyngeal Swab     Status: None   Collection Time: 06/27/19  3:20 PM   Specimen: Nasopharyngeal Swab  Result Value Ref Range Status   SARS Coronavirus 2 NEGATIVE NEGATIVE Final    Comment: (NOTE) SARS-CoV-2 target nucleic acids are NOT DETECTED.  The SARS-CoV-2 RNA is generally detectable in upper and lower respiratory specimens during the acute phase of infection. The lowest concentration of SARS-CoV-2 viral copies this assay can detect is 250 copies / mL. A negative result does not preclude SARS-CoV-2 infection and should not be used as the sole basis for treatment or other patient management  decisions.  A negative result may occur with improper specimen collection / handling, submission of specimen other than nasopharyngeal swab, presence of viral mutation(s) within the areas targeted by this assay, and inadequate number of viral copies (<250 copies / mL). A negative result must be combined with clinical observations, patient history, and epidemiological information.  Fact Sheet for Patients:   StrictlyIdeas.no  Fact Sheet for Healthcare Providers: BankingDealers.co.za  This test is not yet approved or  cleared by the [place] FDA and has been authorized for detection and/or diagnosis of SARS-CoV-2 by FDA under an Emergency Use Authorization (EUA).  This EUA will  remain in effect (meaning this test can be used) for the duration of the COVID-19 declaration under Section 564(b)(1) of the Act, 21 U.S.C. section 360bbb-3(b)(1), unless the authorization is terminated or revoked sooner.  Performed at [facility], [place] [address] [place], [place] [address]          Radiology Studies: No results found.      Scheduled Meds: . apixaban  5 mg Oral BID  . aspirin EC  81 mg Oral Daily  . atorvastatin  10 mg Oral QHS  . calcium-vitamin D  1 tablet Oral Daily  . carvedilol  3.125 mg Oral BID WC  . Chlorhexidine Gluconate Cloth  6 each Topical Daily  . cholecalciferol  4,000 Units Oral Daily  . feeding supplement (ENSURE ENLIVE)  237 mL Oral BID BM  . furosemide  40 mg Intravenous BID  . gabapentin  300 mg Oral BID  . hydrALAZINE  50 mg Oral Q6H  . insulin aspart  0-9 Units Subcutaneous TID WC  . ipratropium  0.5 mg Nebulization TID  . iron polysaccharides  150 mg Oral BID  . isosorbide mononitrate  30 mg Oral Daily  . levothyroxine  50 mcg Oral Q0600  . loratadine  10 mg Oral Daily  . pantoprazole  40 mg Oral QAC breakfast  . sodium bicarbonate  650 mg Oral TID  . sodium chloride flush  10-40 mL  Intracatheter Q12H  . sodium chloride flush  3 mL Intravenous Q12H  . triamcinolone  1 spray Each Nare Daily  . vitamin B-12  500 mcg Oral Daily   Continuous Infusions: . sodium chloride 250 mL (06/28/19 2218)     LOS: 2 days        Hosie Poisson, MD [facility]   To contact the attending provider between 7A-7P or the covering provider during after hours 7P-7A, please log into the web site [email or web address] and access using universal [place] password for that web site. If you do not have the password, please call the hospital operator.  06/29/2019, 3:20 PM

## 2019-06-29 NOTE — Progress Notes (Signed)
Attempted to give pt her am meds, per morning report I gave them whole or halved in applesauce. Pt states she did not want to take them they were to hard to swallow. This nurse asked the pt if she would like her meds crushed in applesauce. Pt rolled her eyes and said " I do not want to take them at all" This nurse crushed her meds and put in applesauce and tried once again to give pt her meds, again pt refused her medications, closed her eyes and turned away from this nurse.

## 2019-06-30 DIAGNOSIS — Z66 Do not resuscitate: Secondary | ICD-10-CM | POA: Diagnosis present

## 2019-06-30 LAB — GLUCOSE, CAPILLARY
Glucose-Capillary: 136 mg/dL — ABNORMAL HIGH (ref 70–99)
Glucose-Capillary: 142 mg/dL — ABNORMAL HIGH (ref 70–99)
Glucose-Capillary: 99 mg/dL (ref 70–99)
Glucose-Capillary: 174 mg/dL — ABNORMAL HIGH (ref 70–99)

## 2019-06-30 LAB — BASIC METABOLIC PANEL
Anion gap: 8 (ref 5–15)
BUN: 29 mg/dL — ABNORMAL HIGH (ref 8–23)
CO2: 21 mmol/L — ABNORMAL LOW (ref 22–32)
Calcium: 8.3 mg/dL — ABNORMAL LOW (ref 8.9–10.3)
Chloride: 108 mmol/L (ref 98–111)
Creatinine, Ser: 2.31 mg/dL — ABNORMAL HIGH (ref 0.44–1.00)
GFR calc Af Amer: 24 mL/min — ABNORMAL LOW (ref >60)
GFR calc non Af Amer: 21 mL/min — ABNORMAL LOW (ref >60)
Glucose, Bld: 133 mg/dL — ABNORMAL HIGH (ref 70–99)
Potassium: 3.8 mmol/L (ref 3.5–5.1)
Sodium: 137 mmol/L (ref 135–145)

## 2019-06-30 NOTE — Progress Notes (Signed)
PROGRESS NOTE    Nicole Sellers  PXT:[id number] DOB: 12-21-1950 DOA: 06/27/2019 PCP: Reynold Bowen, MD    Chief Complaint  Patient presents with  . Shortness of Breath    Brief Narrative: History of stage III rectal cancer s/p chemotherapy and resection, hypertension, insulin-dependent diabetes mellitus, hypothyroidism, peripheral vascular disease, DVT, stage IV CKD presents to ED ED for worsening shortness of breath.  Patient admitted last month for acute CHF and was found to have a left ventricular ejection fraction of 20 to 25% with severe diffuse global hypokinesis.  Patient at that time refused further ischemic work-up and was discharged home with hospice.  She presents this time with acute respiratory failure with hypoxia and respiratory acidosis and she was started on BiPAP overnight.  She was transitioned to high flow nasal cannula oxygen this morning and repeat ABG shows improvement in the pH from 7.2-7.3. Palliative care consulted for goals of care discussion.  After further discussions with palliative care patient did not want to go to rehab and wants to go home with [facility] [place] and wants to remain DNR/DNI at this time. Patient seen and examined at bedside, weaning oxygen to 6 L today.  Assessment & Plan:   Active Problems:   CHF (congestive heart failure) (HCC)   Malnutrition of moderate degree   DNR (do not resuscitate)   Acute respiratory failure with respiratory and metabolic acidosis secondary to acute on chronic systolic heart failure. Patient was started on IV Lasix 40 mg twice daily.  She was initially started on BiPAP and we were able to wean her off the BiPAP and she is transition to high flow nasal cannula oxygen.  We have weaned her oxygen down to 6 L today.  She appears to be clinically improving with Lasix.  Watch her overnight with BiPAP use and plan for discharge in the morning.  Plan to transition her to oral Lasix/torsemide in the  morning Continue with strict intake and output and daily weights.   In view of her multiple medical comorbidities, poor prognosis, deconditioning and debility and and clinical deterioration, palliative care consulted for repeat goals of care discussion.   Essential hypertension Well-controlled. Resume home meds. Of coreg, isosorbide mononitrate.    Stage III rectal cancer s/p chemoradiation and resection. Patient has ostomy in place follow-up with oncology as outpatient. No new complaints.     Hypothyroidism Continue with Synthroid  History of DVT  continue with Eliquis.   Moderate malnutrition Nutritionist consulted and recommendations given.   Hyperlipidemia Continue with the Lipitor 10 mg daily   Diabetes mellitus Continue with sliding scale insulin while inpatient. CBG (last 3)  Recent Labs    06/29/19 2206 06/30/19 0609 06/30/19 1129  GLUCAP 144* 136* 142*    Stage 4 CKD:  Creatinine at 2.4, and stable.    DVT prophylaxis: Lovenox Code Status: DNR Family Communication: None at bedside  disposition:   Status is: Inpatient  Remains inpatient appropriate because:IV treatments appropriate due to intensity of illness or inability to take PO   Dispo: The patient is from: Home              Anticipated d/c is to: Pending              Anticipated d/c date is: 3 days              Patient currently is not medically stable to d/c.       Consultants:   Palliative care consult  Procedures: none    Antimicrobials: None   Subjective: Patient reports her breathing has improved but she is not back to baseline yet.  She denies any chest pain or shortness of breath  Objective: Vitals:   06/30/19 0620 06/30/19 0834 06/30/19 0909 06/30/19 1208  BP:  105/64  107/68  Pulse:  99  83  Resp:    16  Temp:      TempSrc:      SpO2: 98%  100% 99%  Weight:      Height:        Intake/Output Summary (Last 24 hours) at 06/30/2019 1407 Last data filed at  06/30/2019 0900 Gross per 24 hour  Intake 410 ml  Output 1300 ml  Net -890 ml   Filed Weights   06/27/19 2147 06/29/19 0207 06/30/19 0409  Weight: 64.6 kg 65.6 kg 63.5 kg    Examination:  General exam: Alert, not in any kind of distress on 6 L of nasal cannula oxygen. Respiratory system: Air entry fair, no wheezing, no tachypnea tachypnea Cardiovascular system: S1-S2 heard, regular rate rhythm, no JVD, no pedal edema.  Gastrointestinal system: Abdomen is soft, nontender, nondistended, bowel sounds normal. Central nervous system: Alert and oriented, grossly nonfocal Extremities: Pedal edema has improved Skin: no rashes Psychiatry: Mood is appropriate    Data Reviewed: I have personally reviewed following labs and imaging studies  CBC: Recent Labs  Lab 06/27/19 1425 06/27/19 1527  WBC 3.1*  --   NEUTROABS 2.5  --   HGB 10.9* 11.6*  HCT 36.2 34.0*  MCV 98.9  --   PLT 379  --     Basic Metabolic Panel: Recent Labs  Lab 06/27/19 1425 06/27/19 1527 06/28/19 0730 06/29/19 0324 06/30/19 0428  NA 133* 133* 136 136 137  K 4.7 5.0 5.1 4.5 3.8  CL 106  --  107 108 108  CO2 16*  --  17* 21* 21*  GLUCOSE 156*  --  137* 127* 133*  BUN 23  --  23 26* 29*  CREATININE 2.43*  --  2.41* 2.41* 2.31*  CALCIUM 9.2  --  9.1 8.5* 8.3*    GFR: Estimated Creatinine Clearance: 20.9 mL/min (A) (by C-G formula based on SCr of 2.31 mg/dL (H)).  Liver Function Tests: No results for input(s): AST, ALT, ALKPHOS, BILITOT, PROT, ALBUMIN in the last 168 hours.  CBG: Recent Labs  Lab 06/29/19 1418 06/29/19 1733 06/29/19 2206 06/30/19 0609 06/30/19 1129  GLUCAP 146* 134* 144* 136* 142*     Recent Results (from the past 240 hour(s))  SARS Coronavirus 2 by RT PCR (hospital order, performed in [facility] hospital lab) Nasopharyngeal Nasopharyngeal Swab     Status: None   Collection Time: 06/27/19  3:20 PM   Specimen: Nasopharyngeal Swab  Result Value Ref Range Status   SARS  Coronavirus 2 NEGATIVE NEGATIVE Final    Comment: (NOTE) SARS-CoV-2 target nucleic acids are NOT DETECTED.  The SARS-CoV-2 RNA is generally detectable in upper and lower respiratory specimens during the acute phase of infection. The lowest concentration of SARS-CoV-2 viral copies this assay can detect is 250 copies / mL. A negative result does not preclude SARS-CoV-2 infection and should not be used as the sole basis for treatment or other patient management decisions.  A negative result may occur with improper specimen collection / handling, submission of specimen other than nasopharyngeal swab, presence of viral mutation(s) within the areas targeted by this assay, and inadequate number of viral copies (<250  copies / mL). A negative result must be combined with clinical observations, patient history, and epidemiological information.  Fact Sheet for Patients:   StrictlyIdeas.no  Fact Sheet for Healthcare Providers: BankingDealers.co.za  This test is not yet approved or  cleared by the [place] FDA and has been authorized for detection and/or diagnosis of SARS-CoV-2 by FDA under an Emergency Use Authorization (EUA).  This EUA will remain in effect (meaning this test can be used) for the duration of the COVID-19 declaration under Section 564(b)(1) of the Act, 21 U.S.C. section 360bbb-3(b)(1), unless the authorization is terminated or revoked sooner.  Performed at [facility], [place] [address] [place], [place] [address]          Radiology Studies: No results found.      Scheduled Meds: . apixaban  5 mg Oral BID  . aspirin EC  81 mg Oral Daily  . atorvastatin  10 mg Oral QHS  . calcium-vitamin D  1 tablet Oral Daily  . carvedilol  3.125 mg Oral BID WC  . Chlorhexidine Gluconate Cloth  6 each Topical Daily  . cholecalciferol  4,000 Units Oral Daily  . feeding supplement (ENSURE ENLIVE)  237 mL Oral BID BM  .  furosemide  40 mg Intravenous BID  . gabapentin  300 mg Oral BID  . hydrALAZINE  50 mg Oral Q6H  . insulin aspart  0-9 Units Subcutaneous TID WC  . ipratropium  0.5 mg Nebulization TID  . iron polysaccharides  150 mg Oral BID  . isosorbide mononitrate  30 mg Oral Daily  . levothyroxine  50 mcg Oral Q0600  . loratadine  10 mg Oral Daily  . pantoprazole  40 mg Oral QAC breakfast  . sodium bicarbonate  650 mg Oral TID  . sodium chloride flush  10-40 mL Intracatheter Q12H  . sodium chloride flush  3 mL Intravenous Q12H  . triamcinolone  1 spray Each Nare Daily  . vitamin B-12  500 mcg Oral Daily   Continuous Infusions: . sodium chloride 250 mL (06/28/19 2218)     LOS: 3 days        Hosie Poisson, MD [facility]   To contact the attending provider between 7A-7P or the covering provider during after hours 7P-7A, please log into the web site [email or web address] and access using universal [place] password for that web site. If you do not have the password, please call the hospital operator.  06/30/2019, 2:07 PM

## 2019-06-30 NOTE — Progress Notes (Signed)
   Palliative Medicine Inpatient Follow Up Note  Reason for consult:  Goals of care "End Stage Respiratory Failure"  HPI:  Per intake H&P --> Nicole Sellers a 69 y.o.femalewith medical history significant ofstage III rectal cancer status post chemotherapy, radiation and resection,hypertension, IIDM, hypothyroidism, peripheral vascular disease, deep vein thrombosis and chronic kidney disease stage IV, presented with increasing short of breath for7 days.Patient was recently hospitalized in May when she was found to have a new onset of systolic CHF with LVEF 20 to 25% with severe diffuse global hypokinesis. However patient refused further ischemia work-up, and was discharged on May 20.Upon discharge, patient was supposed to take torsemide 60 mg twice daily, however patient told me that she has been only take torsemide 20 mg 3 times a week started on June7,patient however cannot provide me with more details regarding my such a change was made.  Palliative care has seen Nicole Sellers in the past for goals of care. It appears that she had declined hospice on prior occassions. She has a known mistrust for the healthcare system.   Today's Discussion (06/30/2019): Chart reviewed. I met with Nicole Sellers this morning. She is looking forward to discharging from the hospital. She would like to go home with hospice. She states she has intermittent foot pain though this is not new and her current pain regiment addresses it.   Discussed the importance of continued conversation with family and their  medical providers regarding overall plan of care and treatment options, ensuring decisions are within the context of the patients values and GOCs.  Provided "Hard Choices for [employer]" booklet.   Questions and concerns addressed   SUMMARY OF RECOMMENDATIONS  DNR/DNI  MOST in [place]  PMT will continue to support and follow   Time Spent: 25 Greater than 50% of the time  was spent in counseling and coordination of care ______________________________________________________________________________________ [place] Team Team Cell Phone: [phone] Please utilize secure chat with additional questions, if there is no response within 30 minutes please call the above phone number  Palliative Medicine Team providers are available by phone from 7am to 7pm daily and can be reached through the team cell phone.  Should this patient require assistance outside of these hours, please call the patient's attending physician.

## 2019-06-30 NOTE — Consult Note (Signed)
Pt alert and aware sitting on edge of bed. Pt states that she is hopeful to go home soon.  The chaplain offered caring and supportive presence, prayers and blessings. Pt requested further follow-up from spiritual care.

## 2019-06-30 NOTE — TOC Progression Note (Signed)
Transition of Care [facility]) - Progression Note    Patient Details  Name: GRACIN SOOHOO MRN: [id number] Date of Birth: November 28, 1950  [facility] of [facility] CM/SW Contact  Graves-Bigelow, Ocie Cornfield, RN Phone Number: 06/30/2019, 9:53 AM  Clinical Narrative:  Patient presented for CHF- Prior to arrival from home alone and has support of her son. Patient wants to return home with hospice services. Patient had spoken to previous Case Manager, patient wants to use [facility] for Hospice Services- Referral called to [facility] and they will follow the patient while hospitalized. Patient states the only durable medical equipment she feels she will need is oxygen. Case Manager will continue to follow for additional transition of care needs.   Expected Discharge Plan: Home w Hospice Care Barriers to Discharge: Continued Medical Work up  Expected Discharge Plan and Services Expected Discharge Plan: Home w Hospice Care In-house Referral: Clinical Social Work Discharge Planning Services: CM Consult Post Acute Care Choice: Hospice Living arrangements for the past 2 months: Apartment                  HH Arranged: RN [place] Agency: [place] Date [place]: 06/30/19 Time [place]: [phone] Representative spoke with at [place]: [place]    Readmission Risk Interventions Readmission Risk Prevention Plan 06/28/2019 05/22/2019 05/22/2019  Transportation Screening - - Complete  Medication Review Press photographer) Complete - Complete  PCP or Specialist appointment within 3-5 days of discharge - - Complete  PCP/Specialist Appt Not Complete comments - - -  HRI or Home Care Consult - - Complete  SW Recovery Care/Counseling Consult - - Complete  SW Consult Not Complete Comments - - -  Palliative Care Screening - Complete Not [place] - - Not Applicable  Some recent data might be hidden

## 2019-06-30 NOTE — Progress Notes (Signed)
Placed patient on BIPAP for HS. No distress noted at this time. Patient tolerating well at this time. RN aware.

## 2019-07-01 ENCOUNTER — Inpatient Hospital Stay (HOSPITAL_COMMUNITY): Payer: Medicare Other

## 2019-07-01 DIAGNOSIS — Z515 Encounter for palliative care: Secondary | ICD-10-CM

## 2019-07-01 DIAGNOSIS — Z7189 Other specified counseling: Secondary | ICD-10-CM

## 2019-07-01 DIAGNOSIS — Z66 Do not resuscitate: Secondary | ICD-10-CM

## 2019-07-01 LAB — BASIC METABOLIC PANEL WITH GFR
Anion gap: 7 (ref 5–15)
BUN: 29 mg/dL — ABNORMAL HIGH (ref 8–23)
CO2: 23 mmol/L (ref 22–32)
Calcium: 8.3 mg/dL — ABNORMAL LOW (ref 8.9–10.3)
Chloride: 106 mmol/L (ref 98–111)
Creatinine, Ser: 2.3 mg/dL — ABNORMAL HIGH (ref 0.44–1.00)
GFR calc Af Amer: 24 mL/min — ABNORMAL LOW
GFR calc non Af Amer: 21 mL/min — ABNORMAL LOW
Glucose, Bld: 128 mg/dL — ABNORMAL HIGH (ref 70–99)
Potassium: 3.7 mmol/L (ref 3.5–5.1)
Sodium: 136 mmol/L (ref 135–145)

## 2019-07-01 LAB — GLUCOSE, CAPILLARY
Glucose-Capillary: 125 mg/dL — ABNORMAL HIGH (ref 70–99)
Glucose-Capillary: 143 mg/dL — ABNORMAL HIGH (ref 70–99)
Glucose-Capillary: 156 mg/dL — ABNORMAL HIGH (ref 70–99)
Glucose-Capillary: 140 mg/dL — ABNORMAL HIGH (ref 70–99)

## 2019-07-01 LAB — URINALYSIS, ROUTINE W REFLEX MICROSCOPIC
Bilirubin Urine: NEGATIVE
Glucose, UA: NEGATIVE mg/dL
Ketones, ur: NEGATIVE mg/dL
Nitrite: POSITIVE — AB
Protein, ur: NEGATIVE mg/dL
Specific Gravity, Urine: 1.006 (ref 1.005–1.030)
WBC, UA: >50 WBC/hpf — ABNORMAL HIGH (ref 0–5)
pH: 5 (ref 5.0–8.0)

## 2019-07-01 MED ORDER — FUROSEMIDE 10 MG/ML IJ SOLN
80.0000 mg | Freq: Two times a day (BID) | INTRAMUSCULAR | Status: DC
  Administered 2019-07-01 – 2019-07-04 (×6): 80 mg via INTRAVENOUS
  Filled 2019-07-01 (×6): qty 8

## 2019-07-01 NOTE — Progress Notes (Signed)
[facility]  Met with pt in room along with her brother Nicole Sellers who was at bedside. Discussed hospice interdisciplinary care team and goals of comfort. She confirms interest in this and is in agreement. She has in home a cane and walker. She will need oxygen, nebulizer, bi-pap, w/c, BSC and this has been ordered for deliver from [employer]. The pt's brother will make himself available to be at home when delivered. She has been approved for services at home with hospice care.   Webb Silversmith RN [phone]

## 2019-07-01 NOTE — Progress Notes (Signed)
PROGRESS NOTE    Nicole Sellers  BHA:[id number] DOB: 1950/08/06 DOA: 06/27/2019 PCP: Reynold Bowen, MD    Chief Complaint  Patient presents with  . Shortness of Breath    Brief Narrative: History of stage III rectal cancer s/p chemotherapy and resection, hypertension, insulin-dependent diabetes mellitus, hypothyroidism, peripheral vascular disease, DVT, stage IV CKD presents to ED ED for worsening shortness of breath.  Patient admitted last month for acute CHF and was found to have a left ventricular ejection fraction of 20 to 25% with severe diffuse global hypokinesis.  Patient at that time refused further ischemic work-up and was discharged home with hospice.  She presents this time with acute respiratory failure with hypoxia and respiratory acidosis and she was started on BiPAP overnight.  She was transitioned to high flow nasal cannula oxygen this morning and repeat ABG shows improvement in the pH from 7.2-7.3. Palliative care consulted for goals of care discussion.  After further discussions with palliative care patient did not want to go to rehab and wants to go home with [facility] [place] and wants to remain DNR/DNI at this time. Patient seen and examined at bedside we were able to wean her oxygen down to 4 L today.  Assessment & Plan:   Active Problems:   CHF (congestive heart failure) (HCC)   Malnutrition of moderate degree   DNR (do not resuscitate)   Acute respiratory failure with respiratory and metabolic acidosis secondary to acute on chronic systolic heart failure. Patient was started on IV Lasix 40 mg twice daily, not much diuresis today.  Increase Lasix to 80 mg twice daily.  She was initially started on BiPAP and we were able to wean her off the BiPAP and she is transition to high flow nasal cannula oxygen.  We will wean her oxygen down to 4 L/min today.   Patient reports she feels short of breath this morning chest x-ray was repeated did not show any change.   Will order a CT chest without contrast for further evaluation. Continue with strict intake and output and daily weights.  Urine output about -700 mL in the last 24 hours.   In view of her multiple medical comorbidities, poor prognosis, deconditioning and debility and and clinical deterioration, palliative care consulted for repeat goals of care discussion.   Essential hypertension Well-controlled Resume home meds. Of coreg, isosorbide mononitrate.    Stage III rectal cancer s/p chemoradiation and resection. Patient has ostomy in place follow-up with oncology as outpatient. Patient has Port-A-Cath on the right chest    Hypothyroidism Continue with Synthroid.  History of DVT  continue with Eliquis.   Moderate malnutrition Nutritionist consulted and recommendations given.   Hyperlipidemia Continue with the Lipitor 10 mg daily   Diabetes mellitus Continue with sliding scale insulin while inpatient. CBG (last 3)  Recent Labs    06/30/19 2102 07/01/19 0609 07/01/19 1114  GLUCAP 174* 125* 143*  No changes in her medications.  Stage 4 CKD:  Baseline creatinine around 2.5 currently is at 2.3 and stable.   DVT prophylaxis: Lovenox Code Status: DNR Family Communication: None at bedside  disposition:   Status is: Inpatient  Remains inpatient appropriate because:IV treatments appropriate due to intensity of illness or inability to take PO   Dispo: The patient is from: Home              Anticipated d/c is to: Pending              Anticipated d/c  date is: 2 days              Patient currently is not medically stable to d/c.       Consultants:   Palliative care consult  Procedures: none    Antimicrobials: None   Subjective: Patient feels short of breath earlier this morning reports slightly worse than yesterday.  Though we have weaned her oxygen down to 4 L and she is saturating at 98%. Objective: Vitals:   07/01/19 0730 07/01/19 0739 07/01/19 1037  07/01/19 1217  BP:  113/68 124/75 128/76  Pulse: (!) 104 (!) 102 (!) 104 98  Resp: 19 20 16    Temp:  98.7 F (37.1 C) 99 F (37.2 C)   TempSrc:  Oral Oral   SpO2: 99% 98% 100%   Weight:      Height:        Intake/Output Summary (Last 24 hours) at 07/01/2019 1403 Last data filed at 07/01/2019 1313 Gross per 24 hour  Intake 700 ml  Output 1475 ml  Net -775 ml   Filed Weights   06/29/19 0207 06/30/19 0409 07/01/19 0333  Weight: 65.6 kg 63.5 kg 69.5 kg    Examination:  General exam: Alert, on 4 L of nasal cannula oxygen, not in any kind of distress. Respiratory system: Diminished air entry at bases, no wheezing tachypnea present. Cardiovascular system: S1-S2 heard, tachycardic, no JVD, improving pedal edema. Gastrointestinal system: Abdomen is soft, nontender, nondistended, bowel sounds normal. Central nervous system: Alert and oriented, grossly nonfocal Extremities: Improving pedal edema, no cyanosis or clubbing Skin: No rashes seen Psychiatry: Mood is appropriate    Data Reviewed: I have personally reviewed following labs and imaging studies  CBC: Recent Labs  Lab 06/27/19 1425 06/27/19 1527  WBC 3.1*  --   NEUTROABS 2.5  --   HGB 10.9* 11.6*  HCT 36.2 34.0*  MCV 98.9  --   PLT 379  --     Basic Metabolic Panel: Recent Labs  Lab 06/27/19 1425 06/27/19 1425 06/27/19 1527 06/28/19 0730 06/29/19 0324 06/30/19 0428 07/01/19 0323  NA 133*   < > 133* 136 136 137 136  K 4.7   < > 5.0 5.1 4.5 3.8 3.7  CL 106  --   --  107 108 108 106  CO2 16*  --   --  17* 21* 21* 23  GLUCOSE 156*  --   --  137* 127* 133* 128*  BUN 23  --   --  23 26* 29* 29*  CREATININE 2.43*  --   --  2.41* 2.41* 2.31* 2.30*  CALCIUM 9.2  --   --  9.1 8.5* 8.3* 8.3*   < > = values in this interval not displayed.    GFR: Estimated Creatinine Clearance: 21 mL/min (A) (by C-G formula based on SCr of 2.3 mg/dL (H)).  Liver Function Tests: No results for input(s): AST, ALT, ALKPHOS,  BILITOT, PROT, ALBUMIN in the last 168 hours.  CBG: Recent Labs  Lab 06/30/19 1129 06/30/19 1631 06/30/19 2102 07/01/19 0609 07/01/19 1114  GLUCAP 142* 99 174* 125* 143*     Recent Results (from the past 240 hour(s))  SARS Coronavirus 2 by RT PCR (hospital order, performed in [facility] hospital lab) Nasopharyngeal Nasopharyngeal Swab     Status: None   Collection Time: 06/27/19  3:20 PM   Specimen: Nasopharyngeal Swab  Result Value Ref Range Status   SARS Coronavirus 2 NEGATIVE NEGATIVE Final    Comment: (NOTE)  SARS-CoV-2 target nucleic acids are NOT DETECTED.  The SARS-CoV-2 RNA is generally detectable in upper and lower respiratory specimens during the acute phase of infection. The lowest concentration of SARS-CoV-2 viral copies this assay can detect is 250 copies / mL. A negative result does not preclude SARS-CoV-2 infection and should not be used as the sole basis for treatment or other patient management decisions.  A negative result may occur with improper specimen collection / handling, submission of specimen other than nasopharyngeal swab, presence of viral mutation(s) within the areas targeted by this assay, and inadequate number of viral copies (<250 copies / mL). A negative result must be combined with clinical observations, patient history, and epidemiological information.  Fact Sheet for Patients:   StrictlyIdeas.no  Fact Sheet for Healthcare Providers: BankingDealers.co.za  This test is not yet approved or  cleared by the [place] FDA and has been authorized for detection and/or diagnosis of SARS-CoV-2 by FDA under an Emergency Use Authorization (EUA).  This EUA will remain in effect (meaning this test can be used) for the duration of the COVID-19 declaration under Section 564(b)(1) of the Act, 21 U.S.C. section 360bbb-3(b)(1), unless the authorization is terminated or revoked sooner.  Performed at  [facility], [place] [address], [place], [place] [address]          Radiology Studies: DG CHEST PORT 1 VIEW  Result Date: 07/01/2019 CLINICAL DATA:  Shortness of breath EXAM: PORTABLE CHEST 1 VIEW COMPARISON:  06/27/2019 FINDINGS: No significant interval change in AP portable examination with small to moderate layering bilateral pleural effusions and associated atelectasis or consolidation. Mild, diffuse interstitial opacity. The heart and mediastinum are unremarkable. Right chest port catheter. IMPRESSION: No significant interval change in AP portable examination with small to moderate layering bilateral pleural effusions and associated atelectasis or consolidation. Mild, diffuse interstitial opacity, likely edema. No focal airspace opacity. Electronically Signed   By: Eddie Candle M.D.   On: 07/01/2019 13:19        Scheduled Meds: . apixaban  5 mg Oral BID  . aspirin EC  81 mg Oral Daily  . atorvastatin  10 mg Oral QHS  . calcium-vitamin D  1 tablet Oral Daily  . carvedilol  3.125 mg Oral BID WC  . Chlorhexidine Gluconate Cloth  6 each Topical Daily  . cholecalciferol  4,000 Units Oral Daily  . feeding supplement (ENSURE ENLIVE)  237 mL Oral BID BM  . furosemide  80 mg Intravenous BID  . gabapentin  300 mg Oral BID  . hydrALAZINE  50 mg Oral Q6H  . insulin aspart  0-9 Units Subcutaneous TID WC  . iron polysaccharides  150 mg Oral BID  . isosorbide mononitrate  30 mg Oral Daily  . levothyroxine  50 mcg Oral Q0600  . loratadine  10 mg Oral Daily  . pantoprazole  40 mg Oral QAC breakfast  . sodium bicarbonate  650 mg Oral TID  . sodium chloride flush  10-40 mL Intracatheter Q12H  . sodium chloride flush  3 mL Intravenous Q12H  . triamcinolone  1 spray Each Nare Daily  . vitamin B-12  500 mcg Oral Daily   Continuous Infusions: . sodium chloride 250 mL (06/28/19 2218)     LOS: 4 days        Hosie Poisson, MD [facility]   To contact the attending  provider between 7A-7P or the covering provider during after hours 7P-7A, please log into the web site [email or web address] and access using  universal [place] password for that web site. If you do not have the password, please call the hospital operator.  07/01/2019, 2:03 PM

## 2019-07-02 ENCOUNTER — Inpatient Hospital Stay (HOSPITAL_COMMUNITY): Payer: Medicare Other

## 2019-07-02 ENCOUNTER — Other Ambulatory Visit: Payer: Self-pay

## 2019-07-02 HISTORY — PX: IR THORACENTESIS ASP PLEURAL SPACE W/IMG GUIDE: IMG5380

## 2019-07-02 LAB — GLUCOSE, CAPILLARY
Glucose-Capillary: 136 mg/dL — ABNORMAL HIGH (ref 70–99)
Glucose-Capillary: 144 mg/dL — ABNORMAL HIGH (ref 70–99)
Glucose-Capillary: 174 mg/dL — ABNORMAL HIGH (ref 70–99)
Glucose-Capillary: 181 mg/dL — ABNORMAL HIGH (ref 70–99)

## 2019-07-02 LAB — BASIC METABOLIC PANEL
Anion gap: 8 (ref 5–15)
BUN: 26 mg/dL — ABNORMAL HIGH (ref 8–23)
CO2: 21 mmol/L — ABNORMAL LOW (ref 22–32)
Calcium: 8.3 mg/dL — ABNORMAL LOW (ref 8.9–10.3)
Chloride: 106 mmol/L (ref 98–111)
Creatinine, Ser: 2.15 mg/dL — ABNORMAL HIGH (ref 0.44–1.00)
GFR calc Af Amer: 26 mL/min — ABNORMAL LOW (ref >60)
GFR calc non Af Amer: 22 mL/min — ABNORMAL LOW (ref >60)
Glucose, Bld: 118 mg/dL — ABNORMAL HIGH (ref 70–99)
Potassium: 3.5 mmol/L (ref 3.5–5.1)
Sodium: 135 mmol/L (ref 135–145)

## 2019-07-02 MED ORDER — LIDOCAINE HCL (PF) 1 % IJ SOLN
INTRAMUSCULAR | Status: DC | PRN
  Administered 2019-07-02: 5 mL

## 2019-07-02 MED ORDER — LIDOCAINE HCL 1 % IJ SOLN
INTRAMUSCULAR | Status: AC
  Filled 2019-07-02: qty 20

## 2019-07-02 MED ORDER — SODIUM CHLORIDE 0.9 % IV SOLN
1.0000 g | INTRAVENOUS | Status: DC
  Administered 2019-07-02 – 2019-07-04 (×3): 1 g via INTRAVENOUS
  Filled 2019-07-02 (×3): qty 10

## 2019-07-02 MED ORDER — POTASSIUM CHLORIDE CRYS ER 20 MEQ PO TBCR
40.0000 meq | EXTENDED_RELEASE_TABLET | Freq: Once | ORAL | Status: AC
  Administered 2019-07-02: 40 meq via ORAL
  Filled 2019-07-02: qty 2

## 2019-07-02 MED ORDER — SODIUM CHLORIDE 0.9 % IV SOLN: 1.0000 g | INTRAVENOUS | Status: DC

## 2019-07-02 NOTE — Progress Notes (Signed)
PROGRESS NOTE    Nicole Sellers  HER:[id number] DOB: 1950/08/11 DOA: 06/27/2019 PCP: Reynold Bowen, MD    Chief Complaint  Patient presents with  . Shortness of Breath    Brief Narrative: History of stage III rectal cancer s/p chemotherapy and resection, hypertension, insulin-dependent diabetes mellitus, hypothyroidism, peripheral vascular disease, DVT, stage IV CKD presents to ED ED for worsening shortness of breath.  Patient admitted last month for acute CHF and was found to have a left ventricular ejection fraction of 20 to 25% with severe diffuse global hypokinesis.  Patient at that time refused further ischemic work-up and was discharged home with hospice.  She presents this time with acute respiratory failure with hypoxia and respiratory acidosis and she was started on BiPAP overnight.  She was transitioned to high flow nasal cannula oxygen and repeat ABG shows improvement in the pH from 7.0-7.3. Palliative care consulted for goals of care discussion.  After further discussions with palliative care patient did not want to go to rehab and wants to go home with [facility] [place] and wants to remain DNR/DNI at this time. Patient continues to report dyspnea, not much diuresis with IV lasix 80 mg BID, and  unable to wean her off the oxygen,. CT chest without contrast showed moderate bilateral pleural effusions with partial compressive atelectasis of the lower lobes. IR consulted for thoracentesis and she underwent right sided thoracentesis and 1.1 L of clear yellow fluid removed.  Postprocedure chest x-ray shows no pneumothorax.  On ultrasound patient was also found to have large left effusion amenable to percutaneous drainage.  New order for IR thoracentesis on the left ordered.    Patient seen and examined and discussed in detail regarding the CT results and she was agreeable to thoracentesis.  She continues to remain on 4 L of nasal cannula oxygen during the day and using BiPAP at  night.  Assessment & Plan:   Active Problems:   CHF (congestive heart failure) (HCC)   Malnutrition of moderate degree   DNR (do not resuscitate)   Acute respiratory failure with respiratory and metabolic acidosis secondary to acute on chronic systolic heart failure.   She was initially started on BiPAP and we were able to wean her off the BiPAP during the day and transitioned to high flow nasal cannula oxygen.  She is currently on 4 L of nasal cannula oxygen and unable to wean her off the oxygen at this time. She was initially started on IV Lasix 40 mg twice daily, increased to 80 mg twice daily without much diuresis.  CT chest without contrast showed moderate bilateral pleural effusions with partial compressive atelectasis of the lower lobes.  IR consulted for thoracentesis and she underwent right sided thoracentesis and 1.1 L of clear yellow fluid removed.  Postprocedure chest x-ray shows no pneumothorax.  On ultrasound patient was also found to have large left effusion amenable to percutaneous drainage.  New order for IR thoracentesis on the left ordered.    Continue with strict intake and output and daily weights.  Urine output about 1300 mL in the last 24 hours. Monitor renal parameters while on IV Lasix.  Replace potassium today.     In view of her multiple medical comorbidities, poor prognosis, deconditioning and debility and and clinical deterioration, palliative care consulted for repeat goals of care discussion.   Essential hypertension Well-controlled blood pressure parameters Resume home meds. Of coreg, isosorbide mononitrate, hydralazine and Lasix   Stage III rectal cancer s/p chemoradiation  and resection. Patient has ostomy in place with stool, follow-up with oncology as outpatient. Patient has Port-A-Cath on the right chest    Hypothyroidism Continue with Synthroid  History of DVT  continue with Eliquis.   Moderate malnutrition Nutritionist consulted and  recommendations given.   Hyperlipidemia Continue with the Lipitor 10 mg daily   Diabetes mellitus Continue with sliding scale insulin while inpatient. CBG (last 3)  Recent Labs    07/01/19 2125 07/02/19 0612 07/02/19 1221  GLUCAP 140* 136* 144*  No changes in her medications.  Continue with gabapentin for diabetic neuropathy.   Stage 4 CKD:  Baseline creatinine around 2.5 currently is at 2.1 and improving with IV Lasix.    Urinary tract infection Urine analysis shows large leukocytes and positive nitrites with many bacteria Urine cultures will be ordered and she was started on IV Rocephin.  DVT prophylaxis: Eliquis Code Status: DNR Family Communication: None at bedside  disposition:   Status is: Inpatient  Remains inpatient appropriate because:IV treatments appropriate due to intensity of illness or inability to take PO   Dispo: The patient is from: Home              Anticipated d/c is to: Home with Hospice.               Anticipated d/c date is: 2 days              Patient currently is not medically stable to d/c.       Consultants:   Palliative care consult  Procedures: none    Antimicrobials: None   Subjective: Patient continues to complain of shortness of breath.  She reports she does not use oxygen at home and currently we were unable to wean her off the oxygen.  She denies any chest pain nausea vomiting or abdominal pain at this time. Objective: Vitals:   07/02/19 0745 07/02/19 0919 07/02/19 1223 07/02/19 1543  BP: (!) 143/73 130/78 114/78 120/69  Pulse: 95 99 83 92  Resp: 18 17 18 15   Temp:   98 F (36.7 C)   TempSrc:   Oral   SpO2: 100% 100% 100% 100%  Weight:      Height:        Intake/Output Summary (Last 24 hours) at 07/02/2019 1615 Last data filed at 07/02/2019 1433 Gross per 24 hour  Intake 504.72 ml  Output 1450 ml  Net -945.28 ml   Filed Weights   06/30/19 0409 07/01/19 0333 07/02/19 0451  Weight: 63.5 kg 69.5 kg 71.5 kg     Examination:  General exam: Alert mild discomfort on 4 L of nasal cannula oxygen Respiratory system: Diminished air entry at bases, no wheezing, tachypnea present Cardiovascular system: S1-S2 heard, tachycardic, no JVD, pedal edema present  gastrointestinal system: Abdomen is soft, nontender, nondistended, ostomy present with stool.  Bowel sounds normal Central nervous system: Alert and oriented, grossly nonfocal Extremities: Improving leg edema, no cyanosis or clubbing Skin: No rashes seen Psychiatry: Mood is appropriate    Data Reviewed: I have personally reviewed following labs and imaging studies  CBC: Recent Labs  Lab 06/27/19 1425 06/27/19 1527  WBC 3.1*  --   NEUTROABS 2.5  --   HGB 10.9* 11.6*  HCT 36.2 34.0*  MCV 98.9  --   PLT 379  --     Basic Metabolic Panel: Recent Labs  Lab 06/28/19 0730 06/29/19 0324 06/30/19 0428 07/01/19 0323 07/02/19 0450  NA 136 136 137 136 135  K  5.1 4.5 3.8 3.7 3.5  CL 107 108 108 106 106  CO2 17* 21* 21* 23 21*  GLUCOSE 137* 127* 133* 128* 118*  BUN 23 26* 29* 29* 26*  CREATININE 2.41* 2.41* 2.31* 2.30* 2.15*  CALCIUM 9.1 8.5* 8.3* 8.3* 8.3*    GFR: Estimated Creatinine Clearance: 24.3 mL/min (A) (by C-G formula based on SCr of 2.15 mg/dL (H)).  Liver Function Tests: No results for input(s): AST, ALT, ALKPHOS, BILITOT, PROT, ALBUMIN in the last 168 hours.  CBG: Recent Labs  Lab 07/01/19 1114 07/01/19 1623 07/01/19 2125 07/02/19 0612 07/02/19 1221  GLUCAP 143* 156* 140* 136* 144*     Recent Results (from the past 240 hour(s))  SARS Coronavirus 2 by RT PCR (hospital order, performed in [facility] hospital lab) Nasopharyngeal Nasopharyngeal Swab     Status: None   Collection Time: 06/27/19  3:20 PM   Specimen: Nasopharyngeal Swab  Result Value Ref Range Status   SARS Coronavirus 2 NEGATIVE NEGATIVE Final    Comment: (NOTE) SARS-CoV-2 target nucleic acids are NOT DETECTED.  The SARS-CoV-2 RNA is  generally detectable in upper and lower respiratory specimens during the acute phase of infection. The lowest concentration of SARS-CoV-2 viral copies this assay can detect is 250 copies / mL. A negative result does not preclude SARS-CoV-2 infection and should not be used as the sole basis for treatment or other patient management decisions.  A negative result may occur with improper specimen collection / handling, submission of specimen other than nasopharyngeal swab, presence of viral mutation(s) within the areas targeted by this assay, and inadequate number of viral copies (<250 copies / mL). A negative result must be combined with clinical observations, patient history, and epidemiological information.  Fact Sheet for Patients:   StrictlyIdeas.no  Fact Sheet for Healthcare Providers: BankingDealers.co.za  This test is not yet approved or  cleared by the [place] FDA and has been authorized for detection and/or diagnosis of SARS-CoV-2 by FDA under an Emergency Use Authorization (EUA).  This EUA will remain in effect (meaning this test can be used) for the duration of the COVID-19 declaration under Section 564(b)(1) of the Act, 21 U.S.C. section 360bbb-3(b)(1), unless the authorization is terminated or revoked sooner.  Performed at [facility], [place] [address] [place], [place] [address]          Radiology Studies: DG Chest 1 View  Result Date: 07/02/2019 CLINICAL DATA:  Status post thoracentesis. EXAM: CHEST  1 VIEW COMPARISON:  July 01, 2019 FINDINGS: There is stable right-sided venous Port-A-Cath positioning. Mild to moderate severity diffusely increased lung markings are seen, most prominently within the bilateral upper lobes. Stable moderate severity left basilar atelectasis and/or infiltrate is noted. There is a small right pleural effusion which is decreased in size when compared to the prior study. A small to  moderate-sized stable left pleural effusion is seen. No pneumothorax is identified. The cardiac silhouette is markedly enlarged and stable in appearance. The visualized skeletal structures are unremarkable. IMPRESSION: 1. Small right pleural effusion, decreased in size when compared to the prior study. 2. Moderate severity left basilar atelectasis and/or infiltrate. 3. Stable small to moderate-sized left pleural effusion. 4. Stable cardiomegaly. Electronically Signed   By: Virgina Norfolk M.D.   On: 07/02/2019 16:10   CT CHEST WO CONTRAST  Result Date: 07/01/2019 CLINICAL DATA:  69 year old female with shortness of breath. EXAM: CT CHEST WITHOUT CONTRAST TECHNIQUE: Multidetector CT imaging of the chest was performed following the standard  protocol without IV contrast. COMPARISON:  Chest CT dated 05/21/2019. FINDINGS: Evaluation of this exam is limited in the absence of intravenous contrast. Cardiovascular: There is mild cardiomegaly. No significant pericardial effusion. Coronary vascular calcification primarily involving the LAD. There is moderate atherosclerotic calcification of the thoracic aorta. A right-sided Port-A-Cath with tip 6 over central SVC. Mediastinum/Nodes: No hilar or mediastinal adenopathy. Evaluation however is limited in the absence of intravenous contrast as well as due to consolidative changes of the adjacent lungs. The esophagus is grossly unremarkable. No mediastinal fluid collection. Lungs/Pleura: Moderate bilateral pleural effusions with associated partial compressive atelectasis of the lower lobes. Pneumonia is not excluded clinical correlation is recommended there is subsegmental consolidation of the lingula which may represent atelectasis or infiltrate. Bilateral upper lobe predominant peripheral and subpleural clusters of ground-glass opacity concerning for atypical pneumonia including COVID-19. Clinical correlation recommended. There is mild interstitial and interlobular septal  prominence consistent with edema. There is no pneumothorax. The central airways are patent. Upper Abdomen: Not visualized. Musculoskeletal: Degenerative changes of the spine. No acute osseous pathology. IMPRESSION: 1. Moderate bilateral pleural effusions, increased since the prior CT. There is associated partial compressive atelectasis of the lower lobes. Pneumonia is not excluded clinical correlation is recommended. 2. Bilateral upper lobe predominant peripheral and subpleural clusters of ground-glass opacity may represent edema but concerning for atypical pneumonia including COVID-19. Clinical correlation and follow-up to resolution recommended. 3. Cardiomegaly with mild interstitial edema. 4. Aortic Atherosclerosis (ICD10-I70.0). Electronically Signed   By: Anner Crete M.D.   On: 07/01/2019 19:30   DG CHEST PORT 1 VIEW  Result Date: 07/01/2019 CLINICAL DATA:  Shortness of breath EXAM: PORTABLE CHEST 1 VIEW COMPARISON:  06/27/2019 FINDINGS: No significant interval change in AP portable examination with small to moderate layering bilateral pleural effusions and associated atelectasis or consolidation. Mild, diffuse interstitial opacity. The heart and mediastinum are unremarkable. Right chest port catheter. IMPRESSION: No significant interval change in AP portable examination with small to moderate layering bilateral pleural effusions and associated atelectasis or consolidation. Mild, diffuse interstitial opacity, likely edema. No focal airspace opacity. Electronically Signed   By: Eddie Candle M.D.   On: 07/01/2019 13:19   IR THORACENTESIS ASP PLEURAL SPACE W/IMG GUIDE  Result Date: 07/02/2019 INDICATION: Patient with history of rectal cancer, CKD, CHF currently admitted with acute respiratory failure found to have bilateral pleural effusions. Request to IR for therapeutic thoracentesis. EXAM: ULTRASOUND GUIDED RIGHT THORACENTESIS MEDICATIONS: 8 mL 1% lidocaine COMPLICATIONS: None immediate. PROCEDURE:  An ultrasound guided thoracentesis was thoroughly discussed with the patient and questions answered. The benefits, risks, alternatives and complications were also discussed. The patient understands and wishes to proceed with the procedure. Written consent was obtained. Ultrasound was performed to localize and mark an adequate pocket of fluid in the right chest. The area was then prepped and draped in the normal sterile fashion. 1% Lidocaine was used for local anesthesia. Under ultrasound guidance a 6 Fr Safe-T-Centesis catheter was introduced. Thoracentesis was performed. The catheter was removed and a dressing applied. FINDINGS: A total of approximately 1.1 L of clear yellow fluid was removed. IMPRESSION: Successful ultrasound guided right thoracentesis yielding 1.1 L of pleural fluid. Read by Candiss Norse, PA-C Electronically Signed   By: Jerilynn Mages.  Shick M.D.   On: 07/02/2019 15:50        Scheduled Meds: . apixaban  5 mg Oral BID  . aspirin EC  81 mg Oral Daily  . atorvastatin  10 mg Oral QHS  . calcium-vitamin D  1 tablet Oral Daily  . carvedilol  3.125 mg Oral BID WC  . Chlorhexidine Gluconate Cloth  6 each Topical Daily  . cholecalciferol  4,000 Units Oral Daily  . feeding supplement (ENSURE ENLIVE)  237 mL Oral BID BM  . furosemide  80 mg Intravenous BID  . gabapentin  300 mg Oral BID  . hydrALAZINE  50 mg Oral Q6H  . insulin aspart  0-9 Units Subcutaneous TID WC  . iron polysaccharides  150 mg Oral BID  . isosorbide mononitrate  30 mg Oral Daily  . levothyroxine  50 mcg Oral Q0600  . lidocaine      . loratadine  10 mg Oral Daily  . pantoprazole  40 mg Oral QAC breakfast  . sodium bicarbonate  650 mg Oral TID  . sodium chloride flush  10-40 mL Intracatheter Q12H  . sodium chloride flush  3 mL Intravenous Q12H  . triamcinolone  1 spray Each Nare Daily  . vitamin B-12  500 mcg Oral Daily   Continuous Infusions: . sodium chloride 250 mL (06/28/19 2218)  . cefTRIAXone (ROCEPHIN)   IV 1 g (07/02/19 0942)     LOS: 5 days        Hosie Poisson, MD [facility]   To contact the attending provider between 7A-7P or the covering provider during after hours 7P-7A, please log into the web site [email or web address] and access using universal [place] password for that web site. If you do not have the password, please call the hospital operator.  07/02/2019, 4:15 PM

## 2019-07-02 NOTE — Progress Notes (Signed)
Patient returned to unit. 

## 2019-07-02 NOTE — Progress Notes (Signed)
Nutrition Follow-up  DOCUMENTATION CODES:   Non-severe (moderate) malnutrition in context of chronic illness  INTERVENTION:   -Continue Ensure Enlive po BID, each supplement provides 350 kcal and 20 grams of protein -Continue Magic cup TID with meals, each supplement provides 290 kcal and 9 grams of protein -Continue dysphagia 2 diet with thin liquids for ease of intake  NUTRITION DIAGNOSIS:   Moderate Malnutrition related to chronic illness (CKD stage IV, CHF) as evidenced by percent weight loss, energy intake < 75% for > or equal to 1 month, mild fat depletion, moderate fat depletion, moderate muscle depletion, severe muscle depletion, edema.  Ongoing  GOAL:   Patient will meet greater than or equal to 90% of their needs  Progressing   MONITOR:   PO intake, Supplement acceptance, Labs, Weight trends, Skin, I & O's  REASON FOR ASSESSMENT:   Malnutrition Screening Tool    ASSESSMENT:   Nicole Sellers is a 69 y.o. female with medical history significant of stage III rectal cancer status post chemotherapy, radiation and resection, hypertension, IIDM, hypothyroidism, peripheral vascular disease, deep vein thrombosis and chronic kidney disease stage IV, presented with increasing short of breath for 7 days.  Patient was recently hospitalized in May when she was found to have a new onset of systolic CHF with LVEF 20 to 25% with severe diffuse global hypokinesis.  However patient refused further ischemia work-up, and was discharged on May 20.  Upon discharge, patient was supposed to take torsemide 60 mg twice daily, however patient told me that she has been only take torsemide 20 mg 3 times a week started on June 7, patient however cannot provide me with more details regarding my such a change was made.  Reviewed I/O's: -710 ml x 24 hours and -2.3 L since admission  UOP: 1 L x 24 hours  Ileostomy output: 325 ml x 24 hours  Attempted to speak with pt via phone, however, unable to  reach pt.   Pt's intake has improved; noted meal completion 10-80%. Pt is accepting of Ensure supplements, however, refused doses yesterday.   Per MD notes, plan to discharge home with hospice services. Palliative care team following.   Medications reviewed and include lasix and vitamin B-12.   Labs reviewed: CBGS: 140 (inpatient orders for glycemic control are 0-9 units in[place] aspart TID with meals).   Diet Order:   Diet Order            DIET DYS 2 Room service appropriate? Yes with Assist; Fluid consistency: Thin  Diet effective now                 EDUCATION NEEDS:   No education needs have been identified at this time  Skin:  Skin Assessment: Reviewed RN Assessment  Last BM:  07/02/19 (via ileostomy)  Height:   Ht Readings from Last 1 Encounters:  06/27/19 5\' 6"  (1.676 m)    Weight:   Wt Readings from Last 1 Encounters:  07/02/19 71.5 kg    Ideal Body Weight:  59.1 kg  BMI:  Body mass index is 25.45 kg/m.  Estimated Nutritional Needs:   Kcal:  1900-2100  Protein:  95-110 grams  Fluid:  > 1.9 L   Loistine Chance, RD, LDN, [place] Registered Dietitian II Certified Diabetes Care and Education Specialist Please refer to [facility] for RD and/or RD on-call/weekend/after hours pager

## 2019-07-02 NOTE — Progress Notes (Signed)
Placed patient on BIPAP per request for SOB and difficulty breathing. No distress noted. RN aware.

## 2019-07-02 NOTE — TOC Progression Note (Signed)
Transition of Care [facility]) - Progression Note    Patient Details  Name: KRISS PERLEBERG MRN: [id number] Date of Birth: Mar 11, 1950  [facility]) CM/SW Contact  Zenon Mayo, RN Phone Number: 07/02/2019, 2:30 PM  Clinical Narrative:    NCM spoke with [facility] with [facility] [place], she states the DME is supposed to be delivered to patient's home by 3 pm today , she will call this NCM to let know when DME is delivered.   Expected Discharge Plan: Home w Hospice Care Barriers to Discharge: Continued Medical Work up  Expected Discharge Plan and Services Expected Discharge Plan: Home w Hospice Care In-house Referral: Clinical Social Work Discharge Planning Services: CM Consult Post Acute Care Choice: Hospice Living arrangements for the past 2 months: Apartment                           HH Arranged: RN [place] Agency: [place] Date [place]: 06/30/19 Time [place]: [phone] Representative spoke with at [place]: [place] ([place]) Interventions    Readmission Risk Interventions Readmission Risk Prevention Plan 06/28/2019 05/22/2019 05/22/2019  Transportation Screening - - Complete  Medication Review Press photographer) Complete - Complete  PCP or Specialist appointment within 3-5 days of discharge - - Complete  PCP/Specialist Appt Not Complete comments - - -  HRI or Home Care Consult - - Complete  SW Recovery Care/Counseling Consult - - Complete  SW Consult Not Complete Comments - - -  Palliative Care Screening - Complete Not [place] - - Not Applicable  Some recent data might be hidden

## 2019-07-02 NOTE — Progress Notes (Signed)
Placed patient on BIPAP for HS. Patient request. No distress noted at this time. Patient resting comfortably.

## 2019-07-02 NOTE — Progress Notes (Signed)
Patient off of unit to radiology.

## 2019-07-02 NOTE — Procedures (Signed)
PROCEDURE SUMMARY:  Successful image-guided right thoracentesis. Yielded 1.1 liters of clear yellow fluid. Patient tolerated procedure well. EBL: Zero No immediate complications.  Specimen was not sent for labs. Post procedure CXR shows no pneumothorax.  - Large left effusion also noted on [place] today which is amenable to percutaneous drainage. Please place a new order for IR thoracentesis if it is felt patient would benefit from left sided thoracentesis.  Please see imaging section of Epic for full dictation.  Joaquim Nam PA-C 07/02/2019 3:23 PM

## 2019-07-02 NOTE — Plan of Care (Signed)
°  Problem: Education: °Goal: Ability to demonstrate management of disease process will improve °Outcome: Progressing °Goal: Ability to verbalize understanding of medication therapies will improve °Outcome: Progressing °Goal: Individualized Educational Video(s) °Outcome: Progressing °  °

## 2019-07-03 ENCOUNTER — Inpatient Hospital Stay (HOSPITAL_COMMUNITY): Payer: Medicare Other

## 2019-07-03 ENCOUNTER — Encounter (HOSPITAL_COMMUNITY): Payer: Self-pay | Admitting: Internal Medicine

## 2019-07-03 DIAGNOSIS — E119 Type 2 diabetes mellitus without complications: Secondary | ICD-10-CM

## 2019-07-03 DIAGNOSIS — N184 Chronic kidney disease, stage 4 (severe): Secondary | ICD-10-CM

## 2019-07-03 DIAGNOSIS — J90 Pleural effusion, not elsewhere classified: Secondary | ICD-10-CM

## 2019-07-03 DIAGNOSIS — E039 Hypothyroidism, unspecified: Secondary | ICD-10-CM

## 2019-07-03 DIAGNOSIS — J9621 Acute and chronic respiratory failure with hypoxia: Secondary | ICD-10-CM

## 2019-07-03 DIAGNOSIS — I10 Essential (primary) hypertension: Secondary | ICD-10-CM

## 2019-07-03 DIAGNOSIS — C20 Malignant neoplasm of rectum: Secondary | ICD-10-CM

## 2019-07-03 DIAGNOSIS — I824Y1 Acute embolism and thrombosis of unspecified deep veins of right proximal lower extremity: Secondary | ICD-10-CM

## 2019-07-03 HISTORY — PX: IR THORACENTESIS ASP PLEURAL SPACE W/IMG GUIDE: IMG5380

## 2019-07-03 LAB — COMPREHENSIVE METABOLIC PANEL
ALT: 9 U/L (ref 0–44)
AST: 15 U/L (ref 15–41)
Albumin: 2 g/dL — ABNORMAL LOW (ref 3.5–5.0)
Alkaline Phosphatase: 63 U/L (ref 38–126)
Anion gap: 9 (ref 5–15)
BUN: 26 mg/dL — ABNORMAL HIGH (ref 8–23)
CO2: 23 mmol/L (ref 22–32)
Calcium: 8.4 mg/dL — ABNORMAL LOW (ref 8.9–10.3)
Chloride: 103 mmol/L (ref 98–111)
Creatinine, Ser: 2.18 mg/dL — ABNORMAL HIGH (ref 0.44–1.00)
GFR calc Af Amer: 26 mL/min — ABNORMAL LOW (ref >60)
GFR calc non Af Amer: 22 mL/min — ABNORMAL LOW (ref >60)
Glucose, Bld: 122 mg/dL — ABNORMAL HIGH (ref 70–99)
Potassium: 3.7 mmol/L (ref 3.5–5.1)
Sodium: 135 mmol/L (ref 135–145)
Total Bilirubin: 0.5 mg/dL (ref 0.3–1.2)
Total Protein: 5 g/dL — ABNORMAL LOW (ref 6.5–8.1)

## 2019-07-03 LAB — CBC WITH DIFFERENTIAL/PLATELET
Abs Immature Granulocytes: 0 10*3/uL (ref 0.00–0.07)
Basophils Absolute: 0 10*3/uL (ref 0.0–0.1)
Basophils Relative: 1 %
Eosinophils Absolute: 0.2 10*3/uL (ref 0.0–0.5)
Eosinophils Relative: 5 %
HCT: 27.9 % — ABNORMAL LOW (ref 36.0–46.0)
Hemoglobin: 8.4 g/dL — ABNORMAL LOW (ref 12.0–15.0)
Immature Granulocytes: 0 %
Lymphocytes Relative: 12 %
Lymphs Abs: 0.4 10*3/uL — ABNORMAL LOW (ref 0.7–4.0)
MCH: 29.3 pg (ref 26.0–34.0)
MCHC: 30.1 g/dL (ref 30.0–36.0)
MCV: 97.2 fL (ref 80.0–100.0)
Monocytes Absolute: 0.3 10*3/uL (ref 0.1–1.0)
Monocytes Relative: 9 %
Neutro Abs: 2.5 10*3/uL (ref 1.7–7.7)
Neutrophils Relative %: 73 %
Platelets: 310 10*3/uL (ref 150–400)
RBC: 2.87 MIL/uL — ABNORMAL LOW (ref 3.87–5.11)
RDW: 16.1 % — ABNORMAL HIGH (ref 11.5–15.5)
WBC: 3.4 10*3/uL — ABNORMAL LOW (ref 4.0–10.5)
nRBC: 0 % (ref 0.0–0.2)

## 2019-07-03 LAB — GLUCOSE, CAPILLARY
Glucose-Capillary: 132 mg/dL — ABNORMAL HIGH (ref 70–99)
Glucose-Capillary: 117 mg/dL — ABNORMAL HIGH (ref 70–99)
Glucose-Capillary: 136 mg/dL — ABNORMAL HIGH (ref 70–99)
Glucose-Capillary: 165 mg/dL — ABNORMAL HIGH (ref 70–99)

## 2019-07-03 LAB — LACTATE DEHYDROGENASE: LDH: 173 U/L (ref 98–192)

## 2019-07-03 MED ORDER — LIDOCAINE HCL 1 % IJ SOLN
INTRAMUSCULAR | Status: AC
  Filled 2019-07-03: qty 20

## 2019-07-03 MED ORDER — LIDOCAINE HCL 1 % IJ SOLN
INTRAMUSCULAR | Status: DC | PRN
  Administered 2019-07-03: 10 mL

## 2019-07-03 NOTE — Progress Notes (Signed)
PROGRESS NOTE    Nicole Sellers  GDJ:[id number] DOB: 30-Nov-1950 DOA: 06/27/2019 PCP: Reynold Bowen, MD    Chief Complaint  Patient presents with  . Shortness of Breath    Brief Narrative:  History of stage III rectal cancer s/p chemotherapy and resection, hypertension, insulin-dependent diabetes mellitus, hypothyroidism, peripheral vascular disease, DVT, stage IV CKD presents to ED ED for worsening shortness of breath.  Patient admitted last month for acute CHF and was found to have a left ventricular ejection fraction of 20 to 25% with severe diffuse global hypokinesis.  Patient at that time refused further ischemic work-up and was discharged home with hospice.  She presents this time with acute respiratory failure with hypoxia and respiratory acidosis and she was started on BiPAP overnight.  She was transitioned to high flow nasal cannula oxygen and repeat ABG shows improvement in the pH from 7.0-7.3. Palliative care consulted for goals of care discussion.  After further discussions with palliative care patient did not want to go to rehab and wants to go home with [facility] [place] and wants to remain DNR/DNI at this time. Patient continues to report dyspnea, not much diuresis with IV lasix 80 mg BID, and  unable to wean her off the oxygen,. CT chest without contrast showed moderate bilateral pleural effusions with partial compressive atelectasis of the lower lobes. IR consulted for thoracentesis and she underwent right sided thoracentesis and 1.1 L of clear yellow fluid removed.  Postprocedure chest x-ray shows no pneumothorax.  On ultrasound patient was also found to have large left effusion amenable to percutaneous drainage.  New order for IR thoracentesis on the left ordered.    Patient seen and examined and discussed in detail regarding the CT results and she was agreeable to thoracentesis.  She continues to remain on 4 L of nasal cannula oxygen during the day and using BiPAP at  night.    Assessment & Plan:   Principal Problem:   Acute on chronic respiratory failure with hypoxia (HCC) Active Problems:   Essential hypertension   Non-insulin treated type 2 diabetes mellitus (HCC)   Rectal cancer (HCC)   DVT (deep venous thrombosis) (HCC)   CKD (chronic kidney disease) stage 4, GFR 15-29 ml/min (HCC)   Acute congestive heart failure (HCC)   CHF (congestive heart failure) (HCC)   Respiratory distress   Malnutrition of moderate degree   DNR (do not resuscitate)   Pleural effusion on left   Hypothyroidism  1 acute respiratory failure with respiratory metabolic acidosis secondary to acute on chronic systolic heart failure/bilateral pleural effusions Patient had presented worsening shortness of breath.  Patient noted recently admitted for CHF exacerbation noted to have an EF of 20 to 25% with diffuse hypokinesis noted on 2D echo.  Patient at that time refused any further ischemic work-up subsequently discharged home with hospice. Patient presented back with worsening shortness of breath initially started on BiPAP and currently has been weaned off BiPAP during the day and transition to high flow nasal cannula.  Patient on BiPAP at bedtime.  Patient currently on 3 to 4 L nasal cannula.  Patient was on IV Lasix and dose increased to 80 mg twice daily.  Urine output of 1.050 L over the past 24 hours.  Patient is -2.148 L during this hospitalization.  CT chest done showed moderate bilateral pleural effusion with partial compressive atelectasis of the lower bases.  Patient status post bilateral thoracentesis with 1.1 L of clear yellow fluid removed bilaterally.  Postprocedure chest x-ray negative for pneumothorax.  Continue IV diuretics.  Strict I's and O's.  Daily weights.  Follow.  2.  Hypertension Stable.  Continue current regimen of Coreg, Imdur, hydralazine, Lasix.  3.  Stage III rectal cancer status post chemoradiation and resection Patient with ostomy in place with  stool.  Outpatient follow-up with hematology/oncology.  Port-A-Cath noted.  4.  Hypothyroidism Synthroid.  5.  History of DVT Continue Eliquis.  6.  Moderate protein calorie malnutrition Continue current nutritional supplementation.  7.  Hyperlipidemia Continue statin.  8.  Well-controlled diabetes mellitus type 2 with diabetic neuropathy Hemoglobin A1c 5.8 (06/28/2019).  Hold Januvia.  Sliding scale insulin.  Gabapentin.  9.  Chronic kidney disease stage IV Baseline creatinine noted at 2.5.  Creatinine currently at 2.18.  Continue IV Lasix.  Avoid nephrotoxic agents.  Follow.  DVT prophylaxis: Eliquis Code Status: DNR Family Communication: Updated patient.  No family at bedside. Disposition:   Status is: Inpatient    Dispo: The patient is from: Home              Anticipated d/c is to: Home with hospice              Anticipated d/c date is: Tomorrow if continued clinical improvement.              Patient currently on IV Lasix, status post bilateral thoracenteses.       Consultants:   Palliative care: Dr. Domingo Cocking 06/28/2019  Procedures:   CT chest 07/01/2019  Chest x-ray 06/27/2019, 07/01/2019, 07/02/2019, 07/03/2019  Ultrasound-guided right thoracentesis 07/02/2019 per Dr. Annamaria Boots  Ultrasound-guided left thoracentesis 07/03/2019 per Dr. Earleen Newport  Antimicrobials:   IV Rocephin 07/02/2019  Subjective: Sitting up in bed.  Just returning from thoracentesis.  Few shortness of breath has improved.  Denies any chest pain.  Currently on high flow nasal cannula at 3 L with sats of 96%.  Objective: Vitals:   07/03/19 0430 07/03/19 0735 07/03/19 0849 07/03/19 1139  BP:  116/65  105/66  Pulse:  96 96 84  Resp:  18 16 18   Temp:  98.4 F (36.9 C)  98.4 F (36.9 C)  TempSrc:  Oral  Oral  SpO2: 99% 100% 96% 100%  Weight:      Height:        Intake/Output Summary (Last 24 hours) at 07/03/2019 1625 Last data filed at 07/03/2019 1438 Gross per 24 hour  Intake 1264.46 ml   Output 950 ml  Net 314.46 ml   Filed Weights   07/01/19 0333 07/02/19 0451 07/03/19 0346  Weight: 69.5 kg 71.5 kg 71.5 kg    Examination:  General exam: Appears calm and comfortable  Respiratory system: Decreased breath sounds in the bases.  Minimal bibasilar crackles.  Speaking in full sentences.  Normal respiratory effort.   Cardiovascular system: S1 & S2 heard, RRR. No JVD, murmurs, rubs, gallops or clicks. No pedal edema. Gastrointestinal system: Abdomen is nondistended, soft and nontender. No organomegaly or masses felt. Normal bowel sounds heard. Central nervous system: Alert and oriented. No focal neurological deficits. Extremities: Symmetric 5 x 5 power. Skin: No rashes, lesions or ulcers Psychiatry: Judgement and insight appear normal. Mood & affect appropriate.     Data Reviewed: I have personally reviewed following labs and imaging studies  CBC: Recent Labs  Lab 06/27/19 1425 06/27/19 1527 07/03/19 1230  WBC 3.1*  --  3.4*  NEUTROABS 2.5  --  2.5  HGB 10.9* 11.6* 8.4*  HCT 36.2 34.0* 27.9*  MCV 98.9  --  97.2  PLT 379  --  440    Basic Metabolic Panel: Recent Labs  Lab 06/29/19 0324 06/30/19 0428 07/01/19 0323 07/02/19 0450 07/03/19 1230  NA 136 137 136 135 135  K 4.5 3.8 3.7 3.5 3.7  CL 108 108 106 106 103  CO2 21* 21* 23 21* 23  GLUCOSE 127* 133* 128* 118* 122*  BUN 26* 29* 29* 26* 26*  CREATININE 2.41* 2.31* 2.30* 2.15* 2.18*  CALCIUM 8.5* 8.3* 8.3* 8.3* 8.4*    GFR: Estimated Creatinine Clearance: 24 mL/min (A) (by C-G formula based on SCr of 2.18 mg/dL (H)).  Liver Function Tests: Recent Labs  Lab 07/03/19 1230  AST 15  ALT 9  ALKPHOS 63  BILITOT 0.5  PROT 5.0*  ALBUMIN 2.0*    CBG: Recent Labs  Lab 07/02/19 1221 07/02/19 1718 07/02/19 2118 07/03/19 0625 07/03/19 1135  GLUCAP 144* 174* 181* 132* 117*     Recent Results (from the past 240 hour(s))  SARS Coronavirus 2 by RT PCR (hospital order, performed in [facility]  hospital lab) Nasopharyngeal Nasopharyngeal Swab     Status: None   Collection Time: 06/27/19  3:20 PM   Specimen: Nasopharyngeal Swab  Result Value Ref Range Status   SARS Coronavirus 2 NEGATIVE NEGATIVE Final    Comment: (NOTE) SARS-CoV-2 target nucleic acids are NOT DETECTED.  The SARS-CoV-2 RNA is generally detectable in upper and lower respiratory specimens during the acute phase of infection. The lowest concentration of SARS-CoV-2 viral copies this assay can detect is 250 copies / mL. A negative result does not preclude SARS-CoV-2 infection and should not be used as the sole basis for treatment or other patient management decisions.  A negative result may occur with improper specimen collection / handling, submission of specimen other than nasopharyngeal swab, presence of viral mutation(s) within the areas targeted by this assay, and inadequate number of viral copies (<250 copies / mL). A negative result must be combined with clinical observations, patient history, and epidemiological information.  Fact Sheet for Patients:   StrictlyIdeas.no  Fact Sheet for Healthcare Providers: BankingDealers.co.za  This test is not yet approved or  cleared by the [place] FDA and has been authorized for detection and/or diagnosis of SARS-CoV-2 by FDA under an Emergency Use Authorization (EUA).  This EUA will remain in effect (meaning this test can be used) for the duration of the COVID-19 declaration under Section 564(b)(1) of the Act, 21 U.S.C. section 360bbb-3(b)(1), unless the authorization is terminated or revoked sooner.  Performed at [facility], [place] [address], [place], [place] [address]          Radiology Studies: DG Chest 1 View  Result Date: 07/03/2019 CLINICAL DATA:  Post left thoracentesis EXAM: CHEST  1 VIEW COMPARISON:  07/02/2019 FINDINGS: Decreased left pleural effusion. No pneumothorax. Improved aeration  left lower lobe. Progression of right pleural effusion and right lower lobe atelectasis. Port-A-Cath tip SVC. IMPRESSION: No pneumothorax post left thoracentesis Progression of right pleural effusion and right lower lobe airspace disease. Electronically Signed   By: Franchot Gallo M.D.   On: 07/03/2019 11:31   DG Chest 1 View  Result Date: 07/02/2019 CLINICAL DATA:  Status post thoracentesis. EXAM: CHEST  1 VIEW COMPARISON:  July 01, 2019 FINDINGS: There is stable right-sided venous Port-A-Cath positioning. Mild to moderate severity diffusely increased lung markings are seen, most prominently within the bilateral upper lobes. Stable moderate severity left basilar atelectasis and/or infiltrate is  noted. There is a small right pleural effusion which is decreased in size when compared to the prior study. A small to moderate-sized stable left pleural effusion is seen. No pneumothorax is identified. The cardiac silhouette is markedly enlarged and stable in appearance. The visualized skeletal structures are unremarkable. IMPRESSION: 1. Small right pleural effusion, decreased in size when compared to the prior study. 2. Moderate severity left basilar atelectasis and/or infiltrate. 3. Stable small to moderate-sized left pleural effusion. 4. Stable cardiomegaly. Electronically Signed   By: Virgina Norfolk M.D.   On: 07/02/2019 16:10   CT CHEST WO CONTRAST  Result Date: 07/01/2019 CLINICAL DATA:  69 year old female with shortness of breath. EXAM: CT CHEST WITHOUT CONTRAST TECHNIQUE: Multidetector CT imaging of the chest was performed following the standard protocol without IV contrast. COMPARISON:  Chest CT dated 05/21/2019. FINDINGS: Evaluation of this exam is limited in the absence of intravenous contrast. Cardiovascular: There is mild cardiomegaly. No significant pericardial effusion. Coronary vascular calcification primarily involving the LAD. There is moderate atherosclerotic calcification of the thoracic  aorta. A right-sided Port-A-Cath with tip 6 over central SVC. Mediastinum/Nodes: No hilar or mediastinal adenopathy. Evaluation however is limited in the absence of intravenous contrast as well as due to consolidative changes of the adjacent lungs. The esophagus is grossly unremarkable. No mediastinal fluid collection. Lungs/Pleura: Moderate bilateral pleural effusions with associated partial compressive atelectasis of the lower lobes. Pneumonia is not excluded clinical correlation is recommended there is subsegmental consolidation of the lingula which may represent atelectasis or infiltrate. Bilateral upper lobe predominant peripheral and subpleural clusters of ground-glass opacity concerning for atypical pneumonia including COVID-19. Clinical correlation recommended. There is mild interstitial and interlobular septal prominence consistent with edema. There is no pneumothorax. The central airways are patent. Upper Abdomen: Not visualized. Musculoskeletal: Degenerative changes of the spine. No acute osseous pathology. IMPRESSION: 1. Moderate bilateral pleural effusions, increased since the prior CT. There is associated partial compressive atelectasis of the lower lobes. Pneumonia is not excluded clinical correlation is recommended. 2. Bilateral upper lobe predominant peripheral and subpleural clusters of ground-glass opacity may represent edema but concerning for atypical pneumonia including COVID-19. Clinical correlation and follow-up to resolution recommended. 3. Cardiomegaly with mild interstitial edema. 4. Aortic Atherosclerosis (ICD10-I70.0). Electronically Signed   By: Anner Crete M.D.   On: 07/01/2019 19:30   IR THORACENTESIS ASP PLEURAL SPACE W/IMG GUIDE  Result Date: 07/03/2019 INDICATION: Patient with history of rectal cancer, CKD, congestive heart failure with history of bilateral pleural effusions. She is status post right thoracentesis 07/02/2019 with 1.1 L of fluid removed. She now presents  for left thoracentesis. EXAM: ULTRASOUND GUIDED LEFT THORACENTESIS MEDICATIONS: 10 mL 1% lidocaine COMPLICATIONS: None immediate. PROCEDURE: An ultrasound guided thoracentesis was thoroughly discussed with the patient and questions answered. The benefits, risks, alternatives and complications were also discussed. The patient understands and wishes to proceed with the procedure. Written consent was obtained. Ultrasound was performed to localize and mark an adequate pocket of fluid in the left chest. The area was then prepped and draped in the normal sterile fashion. 1% Lidocaine was used for local anesthesia. Under ultrasound guidance a 6 Fr Safe-T-Centesis catheter was introduced. Thoracentesis was performed. The catheter was removed and a dressing applied. FINDINGS: A total of approximately 1.1 liters of yellow fluid was removed. IMPRESSION: Successful ultrasound guided therapeutic left thoracentesis yielding 1.1 liters of pleural fluid. Read by: Brynda Greathouse PA-C Electronically Signed   By: Corrie Mckusick D.O.   On: 07/03/2019 11:57  IR THORACENTESIS ASP PLEURAL SPACE W/IMG GUIDE  Result Date: 07/02/2019 INDICATION: Patient with history of rectal cancer, CKD, CHF currently admitted with acute respiratory failure found to have bilateral pleural effusions. Request to IR for therapeutic thoracentesis. EXAM: ULTRASOUND GUIDED RIGHT THORACENTESIS MEDICATIONS: 8 mL 1% lidocaine COMPLICATIONS: None immediate. PROCEDURE: An ultrasound guided thoracentesis was thoroughly discussed with the patient and questions answered. The benefits, risks, alternatives and complications were also discussed. The patient understands and wishes to proceed with the procedure. Written consent was obtained. Ultrasound was performed to localize and mark an adequate pocket of fluid in the right chest. The area was then prepped and draped in the normal sterile fashion. 1% Lidocaine was used for local anesthesia. Under ultrasound guidance a  6 Fr Safe-T-Centesis catheter was introduced. Thoracentesis was performed. The catheter was removed and a dressing applied. FINDINGS: A total of approximately 1.1 L of clear yellow fluid was removed. IMPRESSION: Successful ultrasound guided right thoracentesis yielding 1.1 L of pleural fluid. Read by Candiss Norse, PA-C Electronically Signed   By: Jerilynn Mages.  Shick M.D.   On: 07/02/2019 15:50        Scheduled Meds: . apixaban  5 mg Oral BID  . aspirin EC  81 mg Oral Daily  . atorvastatin  10 mg Oral QHS  . calcium-vitamin D  1 tablet Oral Daily  . carvedilol  3.125 mg Oral BID WC  . Chlorhexidine Gluconate Cloth  6 each Topical Daily  . cholecalciferol  4,000 Units Oral Daily  . feeding supplement (ENSURE ENLIVE)  237 mL Oral BID BM  . furosemide  80 mg Intravenous BID  . gabapentin  300 mg Oral BID  . hydrALAZINE  50 mg Oral Q6H  . insulin aspart  0-9 Units Subcutaneous TID WC  . iron polysaccharides  150 mg Oral BID  . isosorbide mononitrate  30 mg Oral Daily  . levothyroxine  50 mcg Oral Q0600  . lidocaine      . loratadine  10 mg Oral Daily  . pantoprazole  40 mg Oral QAC breakfast  . sodium bicarbonate  650 mg Oral TID  . sodium chloride flush  10-40 mL Intracatheter Q12H  . sodium chloride flush  3 mL Intravenous Q12H  . triamcinolone  1 spray Each Nare Daily  . vitamin B-12  500 mcg Oral Daily   Continuous Infusions: . sodium chloride 10 mL/hr at 07/03/19 0700  . cefTRIAXone (ROCEPHIN)  IV 1 g (07/03/19 0939)     LOS: 6 days    Time spent: 35 minutes    Irine Seal, MD [facility]   To contact the attending provider between 7A-7P or the covering provider during after hours 7P-7A, please log into the web site [email or web address] and access using universal [place] password for that web site. If you do not have the password, please call the hospital operator.  07/03/2019, 4:25 PM

## 2019-07-03 NOTE — Procedures (Signed)
PROCEDURE SUMMARY:  Successful US guided therapeutic left thoracentesis. Yielded 1.1 liters of yellow fluid. Pt tolerated procedure well. No immediate complications.  Specimen not sent for labs. CXR ordered.  EBL < 5 mL  Docia Barrier PA-C 07/03/2019 10:55 AM

## 2019-07-03 NOTE — Plan of Care (Signed)
  Problem: Education: Goal: Ability to demonstrate management of disease process will improve Outcome: Adequate for Discharge Goal: Ability to verbalize understanding of medication therapies will improve Outcome: Adequate for Discharge   Problem: Activity: Goal: Capacity to carry out activities will improve Outcome: Adequate for Discharge   Problem: Cardiac: Goal: Ability to achieve and maintain adequate cardiopulmonary perfusion will improve Outcome: Adequate for Discharge   

## 2019-07-03 NOTE — Progress Notes (Signed)
[facility]:  [place]  Pt equipment has been delivered including bi-pap for night use. Respiratory will come back out when pt discharged home so they can fit her to the device.  We will be able to accept pt when she is felt to be stable for discharge home.   Thank you Webb Silversmith RN [phone]

## 2019-07-04 LAB — GLUCOSE, CAPILLARY
Glucose-Capillary: 125 mg/dL — ABNORMAL HIGH (ref 70–99)
Glucose-Capillary: 153 mg/dL — ABNORMAL HIGH (ref 70–99)

## 2019-07-04 LAB — HEMOGLOBIN AND HEMATOCRIT, BLOOD
HCT: 26.2 % — ABNORMAL LOW (ref 36.0–46.0)
Hemoglobin: 8.1 g/dL — ABNORMAL LOW (ref 12.0–15.0)

## 2019-07-04 LAB — BASIC METABOLIC PANEL
Anion gap: 8 (ref 5–15)
BUN: 24 mg/dL — ABNORMAL HIGH (ref 8–23)
CO2: 24 mmol/L (ref 22–32)
Calcium: 8.4 mg/dL — ABNORMAL LOW (ref 8.9–10.3)
Chloride: 102 mmol/L (ref 98–111)
Creatinine, Ser: 2.2 mg/dL — ABNORMAL HIGH (ref 0.44–1.00)
GFR calc Af Amer: 25 mL/min — ABNORMAL LOW (ref >60)
GFR calc non Af Amer: 22 mL/min — ABNORMAL LOW (ref >60)
Glucose, Bld: 134 mg/dL — ABNORMAL HIGH (ref 70–99)
Potassium: 3.6 mmol/L (ref 3.5–5.1)
Sodium: 134 mmol/L — ABNORMAL LOW (ref 135–145)

## 2019-07-04 MED ORDER — PANTOPRAZOLE SODIUM 40 MG PO TBEC: 40.0000 mg | DELAYED_RELEASE_TABLET | Freq: Every day | ORAL | 1 refills | Status: DC

## 2019-07-04 MED ORDER — HYDRALAZINE HCL 50 MG PO TABS: 50.0000 mg | ORAL_TABLET | Freq: Four times a day (QID) | ORAL | 1 refills | Status: DC

## 2019-07-04 MED ORDER — NITROGLYCERIN 0.4 MG SL SUBL: 0.4000 mg | SUBLINGUAL_TABLET | SUBLINGUAL | 1 refills | Status: DC | PRN

## 2019-07-04 MED ORDER — APIXABAN 5 MG PO TABS: 5.0000 mg | ORAL_TABLET | Freq: Two times a day (BID) | ORAL | 1 refills | Status: DC

## 2019-07-04 MED ORDER — HEPARIN SOD (PORK) LOCK FLUSH 100 UNIT/ML IV SOLN
500.0000 [IU] | INTRAVENOUS | Status: AC | PRN
  Administered 2019-07-04: 500 [IU]
  Filled 2019-07-04: qty 5

## 2019-07-04 MED ORDER — TORSEMIDE 20 MG PO TABS: 80.0000 mg | ORAL_TABLET | Freq: Two times a day (BID) | ORAL | 1 refills | Status: DC

## 2019-07-04 MED ORDER — SODIUM BICARBONATE 650 MG PO TABS: 650.0000 mg | ORAL_TABLET | Freq: Three times a day (TID) | ORAL | 1 refills | Status: DC

## 2019-07-04 MED ORDER — ISOSORBIDE MONONITRATE ER 30 MG PO TB24: 30.0000 mg | ORAL_TABLET | Freq: Every day | ORAL | 1 refills | Status: DC

## 2019-07-04 MED ORDER — CARVEDILOL 3.125 MG PO TABS: 3.1250 mg | ORAL_TABLET | Freq: Two times a day (BID) | ORAL | 1 refills | Status: AC

## 2019-07-04 MED ORDER — LEVOTHYROXINE SODIUM 50 MCG PO TABS: 50.0000 ug | ORAL_TABLET | Freq: Every day | ORAL | 1 refills | Status: DC

## 2019-07-04 NOTE — TOC Transition Note (Signed)
Transition of Care [facility]) - CM/SW Discharge Note   Patient Details  Name: Nicole Sellers MRN: [id number] Date of Birth: 10-Oct-1950  [facility] of [facility]) CM/SW Contact:  Zenon Mayo, RN Phone Number: 07/04/2019, 10:48 AM   Clinical Narrative:    Patient is for dc home with [facility] [place] today, she will be transported home by her son and he will bring her oxygen for her to go home with  Per Sheree with [facility] [place]. Will need DNR form to go with her in her packet.    Final next level of care: Home w Hospice Care Barriers to Discharge: No Barriers Identified   Patient Goals and CMS Choice Patient states their goals for this hospitalization and ongoing recovery are:: HOme with [facility] as long as its known what to expect      Discharge Placement                       Discharge Plan and Services In-house Referral: Clinical Social Work Discharge Planning Services: CM Consult Post Acute Care Choice: Hospice                    HH Arranged: RN [facility] Agency: [place] Date [place]: 06/30/19 Time [place]: [phone] Representative spoke with at [place]: [place] ([place]) Interventions     Readmission Risk Interventions Readmission Risk Prevention Plan 06/28/2019 05/22/2019 05/22/2019  Transportation Screening - - Complete  Medication Review Press photographer) Complete - Complete  PCP or Specialist appointment within 3-5 days of discharge - - Complete  PCP/Specialist Appt Not Complete comments - - -  HRI or Home Care Consult - - Complete  SW Recovery Care/Counseling Consult - - Complete  SW Consult Not Complete Comments - - -  Palliative Care Screening - Complete Not [place] - - Not Applicable  Some recent data might be hidden

## 2019-07-04 NOTE — Progress Notes (Signed)
Family informed of discharge and need to bring o2 tank waiting on their arrival. Spoke with [place] reported Rn would be out see patient in her home at 2 pm today. Patient is dressed and ready to go waiting on Son to arrive. Consult with IV team for [facility] cath needle de access await their arrival.

## 2019-07-04 NOTE — Care Management Important Message (Signed)
Important Message  Patient Details  Name: Nicole Sellers MRN: [id number] Date of Birth: 06/06/1950   Medicare Important Message Given:        Orbie Pyo 07/04/2019, 2:46 PM

## 2019-07-04 NOTE — Progress Notes (Signed)
Patient taken out via [facility] for home. Discharge instructions given patient veralized instructions given. No further changes noted.

## 2019-07-04 NOTE — Discharge Summary (Signed)
Physician Discharge Summary  DARTHY MANGANELLI OFB:[id number] DOB: [birth year] DOA: 06/27/2019  PCP: Reynold Bowen, MD  Admit date: 06/27/2019 Discharge date: 07/04/2019  Time spent: 55 minutes  Recommendations for Outpatient Follow-up:  1. Patient be discharged home with hospice. 2. Follow-up with Dr. Quay Burow, cardiology in 2 weeks. 3. Follow-up with Reynold Bowen, MD in 2 weeks. Will need a basic metabolic profile done to follow-up on electrolytes and renal function.   Discharge Diagnoses:  Principal Problem:   Acute on chronic respiratory failure with hypoxia (HCC) Active Problems:   Essential hypertension   Non-insulin treated type 2 diabetes mellitus (HCC)   Rectal cancer (HCC)   DVT (deep venous thrombosis) (HCC)   CKD (chronic kidney disease) stage 4, GFR 15-29 ml/min (HCC)   Acute congestive heart failure (HCC)   CHF (congestive heart failure) (HCC)   Respiratory distress   Malnutrition of moderate degree   DNR (do not resuscitate)   Pleural effusion on left   Hypothyroidism   Discharge Condition: Stable and improved.  Diet recommendation: Heart healthy  Filed Weights   07/02/19 0451 07/03/19 0346 07/04/19 0900  Weight: 71.5 kg 71.5 kg 68 kg    History of present illness:  HPI per Dr. Verita Schneiders Nicole Sellers is a 69 y.o. female with medical history significant of stage III rectal cancer status post chemotherapy, radiation and resection, hypertension, IIDM, hypothyroidism, peripheral vascular disease, deep vein thrombosis and chronic kidney disease stage IV, presented with increasing short of breath for 7 days.  Patient was recently hospitalized in May when she was found to have a new onset of systolic CHF with LVEF 20 to 25% with severe diffuse global hypokinesis.  However patient refused further ischemia work-up, and was discharged on May 20.  Upon discharge, patient was supposed to take torsemide 60 mg twice daily, however patient told me that she has been only  take torsemide 20 mg 3 times a week started on June 7, patient however cannot provide me with more details regarding my such a change was made. ED Course: Patient has severe respiratory distress, ABG shows pH 7.0, PCO2 62, bicarb 16.  BiPAP was started.  Hospital Course:  1 acute respiratory failure with respiratory metabolic acidosis secondary to acute on chronic systolic heart failure/bilateral pleural effusions Patient had presented worsening shortness of breath.  Patient noted recently admitted for CHF exacerbation noted to have an EF of 20 to 25% with diffuse hypokinesis noted on 2D echo.  Patient at that time refused any further ischemic work-up subsequently discharged home with hospice. Patient presented back with worsening shortness of breath initially started on BiPAP and weaned off BiPAP during the day and transitioned to high flow nasal cannula.  Patient on BiPAP at bedtime.  Patient was on 3-4 L nasal cannula.  Patient was on IV Lasix and dose increased to 80 mg twice daily with good urine output and clinical improvement. Patient -2.068 L over the hospitalization.CT chest done showed moderate bilateral pleural effusion with partial compressive atelectasis of the lower bases.  Patient status post bilateral thoracentesis with 1.1 L of clear yellow fluid removed bilaterally.  Postprocedure chest x-ray negative for pneumothorax. Patient maintained on IV Lasix and subsequently transition to oral torsemide on discharge. Patient improved clinically. Patient be discharged home with hospice. Outpatient follow-up with cardiology, PCP.   2.  Hypertension Stable.    Patient maintained on Coreg, Imdur, hydralazine, IV Lasix with good blood pressure control.  Patient will be transitioned from IV Lasix  to oral torsemide on discharge.  Outpatient follow-up.    3.  Stage III rectal cancer status post chemoradiation and resection Patient with ostomy in place with stool.  Outpatient follow-up with  hematology/oncology.  Port-A-Cath noted.  4.  Hypothyroidism Patient maintained on home regimen Synthroid.  5.  History of DVT Patient maintained on home regimen Eliquis.    6.  Moderate protein calorie malnutrition Patient maintained on nutritional supplementation.   7.  Hyperlipidemia Patient maintained on statin.    8.  Well-controlled diabetes mellitus type 2 with diabetic neuropathy Hemoglobin A1c 5.8 (06/28/2019).  Patient Celesta Gentile was held throughout the hospitalization patient maintained on sliding scale insulin.  Patient maintained on home regimen gabapentin.  Outpatient follow-up.   9.  Chronic kidney disease stage IV Baseline creatinine noted at 2.5.    Patient maintained on IV Lasix.  Renal function remained stable.  By day of discharge creatinine was 2.20.  Outpatient follow-up.    Procedures:  CT chest 07/01/2019  Chest x-ray 06/27/2019, 07/01/2019, 07/02/2019, 07/03/2019  Ultrasound-guided right thoracentesis 07/02/2019 per Dr. Annamaria Boots  Ultrasound-guided left thoracentesis 07/03/2019 per Dr. Earleen Newport   Consultations:    Palliative care: Dr. Domingo Cocking 06/28/2019 Discharge Exam: Vitals:   07/04/19 0423 07/04/19 0838  BP: 111/62 (!) 97/55  Pulse: 97 94  Resp: 17 20  Temp: 99.1 F (37.3 C) 98.6 F (37 C)  SpO2: 99% 99%    General: NAD Cardiovascular: RRR Respiratory: Decreased breath sounds in the bases.  Discharge Instructions   Discharge Instructions    Diet - low sodium heart healthy   Complete by: As directed    Increase activity slowly   Complete by: As directed      Allergies as of 07/04/2019      Reactions   Kiwi Extract Itching, Other (See Comments)   Throat itching    Lisinopril Other (See Comments)   Bruising   Plavix [clopidogrel] Other (See Comments)   "Made me feel badly- didn't agree with my system"      Medication List    STOP taking these medications   furosemide 40 MG tablet Commonly known as: LASIX    isosorbide-hydrALAZINE 20-37.5 MG tablet Commonly known as: BIDIL   metoprolol succinate 25 MG 24 hr tablet Commonly known as: TOPROL-XL   omeprazole 20 MG tablet Commonly known as: PRILOSEC OTC Replaced by: pantoprazole 40 MG tablet     TAKE these medications   albuterol 108 (90 Base) MCG/ACT inhaler Commonly known as: VENTOLIN HFA Inhale 2 puffs into the lungs every 6 (six) hours as needed for wheezing or shortness of breath.   apixaban 5 MG Tabs tablet Commonly known as: Eliquis Take 1 tablet (5 mg total) by mouth 2 (two) times daily.   ASPERCREME EX Apply 1 application topically daily as needed (to affected areas, for joint pain).   aspirin 81 MG tablet Take 81 mg by mouth daily.   atorvastatin 10 MG tablet Commonly known as: LIPITOR Take 1 tablet (10 mg total) by mouth daily. What changed: when to take this   CALCIUM 600+D PO Take 1 tablet by mouth daily.   carvedilol 3.125 MG tablet Commonly known as: COREG Take 1 tablet (3.125 mg total) by mouth 2 (two) times daily with a meal.   cetirizine 10 MG tablet Commonly known as: ZYRTEC Take 10 mg by mouth daily.   Ferrex 150 150 MG capsule Generic drug: iron polysaccharides Take 150 mg by mouth 2 (two) times daily.   gabapentin 300 MG  capsule Commonly known as: NEURONTIN Take 300 mg by mouth 2 (two) times daily.   hydrALAZINE 50 MG tablet Commonly known as: APRESOLINE Take 1 tablet (50 mg total) by mouth every 6 (six) hours.   isosorbide mononitrate 30 MG 24 hr tablet Commonly known as: IMDUR Take 1 tablet (30 mg total) by mouth daily. Start taking on: July 05, 2019   levothyroxine 50 MCG tablet Commonly known as: SYNTHROID Take 1 tablet (50 mcg total) by mouth daily.   METAMUCIL FIBER PO Take 1 Scoop by mouth See admin instructions. Mix 1 scoopful into 6-8 ounces of desired beverage and drink two times a day   NASACORT ALLERGY 24HR NA Place 1 spray into both nostrils daily as needed (allergies).    nitroGLYCERIN 0.4 MG SL tablet Commonly known as: NITROSTAT Place 1 tablet (0.4 mg total) under the tongue every 5 (five) minutes as needed for chest pain.   pantoprazole 40 MG tablet Commonly known as: PROTONIX Take 1 tablet (40 mg total) by mouth daily before breakfast. Start taking on: July 05, 2019 Replaces: omeprazole 20 MG tablet   SENIOR MULTIVITAMIN PLUS PO Take 1 tablet by mouth daily with breakfast.   sitaGLIPtin 25 MG tablet Commonly known as: Januvia Take 1 tablet (25 mg total) by mouth daily.   sodium bicarbonate 650 MG tablet Take 1 tablet (650 mg total) by mouth 3 (three) times daily.   tetrahydrozoline-zinc 0.05-0.25 % ophthalmic solution Commonly known as: VISINE-AC Place 1 drop into both eyes 2 (two) times daily as needed (dry eyes).   torsemide 20 MG tablet Commonly known as: Demadex Take 4 tablets (80 mg total) by mouth 2 (two) times daily. What changed: how much to take   traMADol 50 MG tablet Commonly known as: ULTRAM Take 1 tablet (50 mg total) by mouth every 12 (twelve) hours as needed for moderate pain.   vitamin B-12 500 MCG tablet Commonly known as: CYANOCOBALAMIN Take 500 mcg by mouth daily.   Vitamin D3 50 MCG (2000 UT) Tabs Take 4,000 Units by mouth daily.      Allergies  Allergen Reactions  . Kiwi Extract Itching and Other (See Comments)    Throat itching   . Lisinopril Other (See Comments)    Bruising   . Plavix [Clopidogrel] Other (See Comments)    "Made me feel badly- didn't agree with my system"    Follow-up Information    [facility] Follow up.   Why: Registered Nurse-office to call with visit time.  Contact information: [place] [phone]       Reynold Bowen, MD. Schedule an appointment as soon as possible for a visit in 2 week(s).   Specialty: Endocrinology Contact information: [place] [address] [phone]        Lorretta Harp,  MD. Schedule an appointment as soon as possible for a visit in 2 week(s).   Specialties: Cardiology, Radiology Contact information: [address] [place] [address] [phone]                The results of significant diagnostics from this hospitalization (including imaging, microbiology, ancillary and laboratory) are listed below for reference.    Significant Diagnostic Studies: DG Chest 1 View  Result Date: 07/03/2019 CLINICAL DATA:  Post left thoracentesis EXAM: CHEST  1 VIEW COMPARISON:  07/02/2019 FINDINGS: Decreased left pleural effusion. No pneumothorax. Improved aeration left lower lobe. Progression of right pleural effusion and right  lower lobe atelectasis. Port-A-Cath tip SVC. IMPRESSION: No pneumothorax post left thoracentesis Progression of right pleural effusion and right lower lobe airspace disease. Electronically Signed   By: Franchot Gallo M.D.   On: 07/03/2019 11:31   DG Chest 1 View  Result Date: 07/02/2019 CLINICAL DATA:  Status post thoracentesis. EXAM: CHEST  1 VIEW COMPARISON:  July 01, 2019 FINDINGS: There is stable right-sided venous Port-A-Cath positioning. Mild to moderate severity diffusely increased lung markings are seen, most prominently within the bilateral upper lobes. Stable moderate severity left basilar atelectasis and/or infiltrate is noted. There is a small right pleural effusion which is decreased in size when compared to the prior study. A small to moderate-sized stable left pleural effusion is seen. No pneumothorax is identified. The cardiac silhouette is markedly enlarged and stable in appearance. The visualized skeletal structures are unremarkable. IMPRESSION: 1. Small right pleural effusion, decreased in size when compared to the prior study. 2. Moderate severity left basilar atelectasis and/or infiltrate. 3. Stable small to moderate-sized left pleural effusion. 4. Stable cardiomegaly. Electronically Signed   By: Virgina Norfolk  M.D.   On: 07/02/2019 16:10   CT CHEST WO CONTRAST  Result Date: 07/01/2019 CLINICAL DATA:  69 year old female with shortness of breath. EXAM: CT CHEST WITHOUT CONTRAST TECHNIQUE: Multidetector CT imaging of the chest was performed following the standard protocol without IV contrast. COMPARISON:  Chest CT dated 05/21/2019. FINDINGS: Evaluation of this exam is limited in the absence of intravenous contrast. Cardiovascular: There is mild cardiomegaly. No significant pericardial effusion. Coronary vascular calcification primarily involving the LAD. There is moderate atherosclerotic calcification of the thoracic aorta. A right-sided Port-A-Cath with tip 6 over central SVC. Mediastinum/Nodes: No hilar or mediastinal adenopathy. Evaluation however is limited in the absence of intravenous contrast as well as due to consolidative changes of the adjacent lungs. The esophagus is grossly unremarkable. No mediastinal fluid collection. Lungs/Pleura: Moderate bilateral pleural effusions with associated partial compressive atelectasis of the lower lobes. Pneumonia is not excluded clinical correlation is recommended there is subsegmental consolidation of the lingula which may represent atelectasis or infiltrate. Bilateral upper lobe predominant peripheral and subpleural clusters of ground-glass opacity concerning for atypical pneumonia including COVID-19. Clinical correlation recommended. There is mild interstitial and interlobular septal prominence consistent with edema. There is no pneumothorax. The central airways are patent. Upper Abdomen: Not visualized. Musculoskeletal: Degenerative changes of the spine. No acute osseous pathology. IMPRESSION: 1. Moderate bilateral pleural effusions, increased since the prior CT. There is associated partial compressive atelectasis of the lower lobes. Pneumonia is not excluded clinical correlation is recommended. 2. Bilateral upper lobe predominant peripheral and subpleural clusters of  ground-glass opacity may represent edema but concerning for atypical pneumonia including COVID-19. Clinical correlation and follow-up to resolution recommended. 3. Cardiomegaly with mild interstitial edema. 4. Aortic Atherosclerosis (ICD10-I70.0). Electronically Signed   By: Anner Crete M.D.   On: 07/01/2019 19:30   DG CHEST PORT 1 VIEW  Result Date: 07/01/2019 CLINICAL DATA:  Shortness of breath EXAM: PORTABLE CHEST 1 VIEW COMPARISON:  06/27/2019 FINDINGS: No significant interval change in AP portable examination with small to moderate layering bilateral pleural effusions and associated atelectasis or consolidation. Mild, diffuse interstitial opacity. The heart and mediastinum are unremarkable. Right chest port catheter. IMPRESSION: No significant interval change in AP portable examination with small to moderate layering bilateral pleural effusions and associated atelectasis or consolidation. Mild, diffuse interstitial opacity, likely edema. No focal airspace opacity. Electronically Signed   By: Dorna Bloom.D.  On: 07/01/2019 13:19   DG Chest Portable 1 View  Result Date: 06/27/2019 CLINICAL DATA:  Shortness of breath EXAM: PORTABLE CHEST 1 VIEW COMPARISON:  05/21/2019 FINDINGS: Cardiac shadow is enlarged. Aortic calcifications are noted. Right chest wall port is again seen and stable. Increasing bilateral pleural effusions is seen with likely underlying atelectasis. No acute bony abnormality is noted. IMPRESSION: Increasing bilateral pleural effusions. Electronically Signed   By: Inez Catalina M.D.   On: 06/27/2019 13:37   IR THORACENTESIS ASP PLEURAL SPACE W/IMG GUIDE  Result Date: 07/03/2019 INDICATION: Patient with history of rectal cancer, CKD, congestive heart failure with history of bilateral pleural effusions. She is status post right thoracentesis 07/02/2019 with 1.1 L of fluid removed. She now presents for left thoracentesis. EXAM: ULTRASOUND GUIDED LEFT THORACENTESIS MEDICATIONS: 10  mL 1% lidocaine COMPLICATIONS: None immediate. PROCEDURE: An ultrasound guided thoracentesis was thoroughly discussed with the patient and questions answered. The benefits, risks, alternatives and complications were also discussed. The patient understands and wishes to proceed with the procedure. Written consent was obtained. Ultrasound was performed to localize and mark an adequate pocket of fluid in the left chest. The area was then prepped and draped in the normal sterile fashion. 1% Lidocaine was used for local anesthesia. Under ultrasound guidance a 6 Fr Safe-T-Centesis catheter was introduced. Thoracentesis was performed. The catheter was removed and a dressing applied. FINDINGS: A total of approximately 1.1 liters of yellow fluid was removed. IMPRESSION: Successful ultrasound guided therapeutic left thoracentesis yielding 1.1 liters of pleural fluid. Read by: Brynda Greathouse PA-C Electronically Signed   By: Corrie Mckusick D.O.   On: 07/03/2019 11:57   IR THORACENTESIS ASP PLEURAL SPACE W/IMG GUIDE  Result Date: 07/02/2019 INDICATION: Patient with history of rectal cancer, CKD, CHF currently admitted with acute respiratory failure found to have bilateral pleural effusions. Request to IR for therapeutic thoracentesis. EXAM: ULTRASOUND GUIDED RIGHT THORACENTESIS MEDICATIONS: 8 mL 1% lidocaine COMPLICATIONS: None immediate. PROCEDURE: An ultrasound guided thoracentesis was thoroughly discussed with the patient and questions answered. The benefits, risks, alternatives and complications were also discussed. The patient understands and wishes to proceed with the procedure. Written consent was obtained. Ultrasound was performed to localize and mark an adequate pocket of fluid in the right chest. The area was then prepped and draped in the normal sterile fashion. 1% Lidocaine was used for local anesthesia. Under ultrasound guidance a 6 Fr Safe-T-Centesis catheter was introduced. Thoracentesis was performed. The  catheter was removed and a dressing applied. FINDINGS: A total of approximately 1.1 L of clear yellow fluid was removed. IMPRESSION: Successful ultrasound guided right thoracentesis yielding 1.1 L of pleural fluid. Read by Candiss Norse, PA-C Electronically Signed   By: Jerilynn Mages.  Shick M.D.   On: 07/02/2019 15:50    Microbiology: Recent Results (from the past 240 hour(s))  SARS Coronavirus 2 by RT PCR (hospital order, performed in [facility] hospital lab) Nasopharyngeal Nasopharyngeal Swab     Status: None   Collection Time: 06/27/19  3:20 PM   Specimen: Nasopharyngeal Swab  Result Value Ref Range Status   SARS Coronavirus 2 NEGATIVE NEGATIVE Final    Comment: (NOTE) SARS-CoV-2 target nucleic acids are NOT DETECTED.  The SARS-CoV-2 RNA is generally detectable in upper and lower respiratory specimens during the acute phase of infection. The lowest concentration of SARS-CoV-2 viral copies this assay can detect is 250 copies / mL. A negative result does not preclude SARS-CoV-2 infection and should not be used as the sole basis for treatment  or other patient management decisions.  A negative result may occur with improper specimen collection / handling, submission of specimen other than nasopharyngeal swab, presence of viral mutation(s) within the areas targeted by this assay, and inadequate number of viral copies (<250 copies / mL). A negative result must be combined with clinical observations, patient history, and epidemiological information.  Fact Sheet for Patients:   StrictlyIdeas.no  Fact Sheet for Healthcare Providers: BankingDealers.co.za  This test is not yet approved or  cleared by the [place] FDA and has been authorized for detection and/or diagnosis of SARS-CoV-2 by FDA under an Emergency Use Authorization (EUA).  This EUA will remain in effect (meaning this test can be used) for the duration of the COVID-19 declaration  under Section 564(b)(1) of the Act, 21 U.S.C. section 360bbb-3(b)(1), unless the authorization is terminated or revoked sooner.  Performed at [facility], [place] [address], [place], [place] [address]      Labs: Basic Metabolic Panel: Recent Labs  Lab 06/30/19 0428 07/01/19 0323 07/02/19 0450 07/03/19 1230 07/04/19 0619  NA 137 136 135 135 134*  K 3.8 3.7 3.5 3.7 3.6  CL 108 106 106 103 102  CO2 21* 23 21* 23 24  GLUCOSE 133* 128* 118* 122* 134*  BUN 29* 29* 26* 26* 24*  CREATININE 2.31* 2.30* 2.15* 2.18* 2.20*  CALCIUM 8.3* 8.3* 8.3* 8.4* 8.4*   Liver Function Tests: Recent Labs  Lab 07/03/19 1230  AST 15  ALT 9  ALKPHOS 63  BILITOT 0.5  PROT 5.0*  ALBUMIN 2.0*   No results for input(s): LIPASE, AMYLASE in the last 168 hours. No results for input(s): AMMONIA in the last 168 hours. CBC: Recent Labs  Lab 06/27/19 1425 06/27/19 1527 07/03/19 1230 07/04/19 0507  WBC 3.1*  --  3.4*  --   NEUTROABS 2.5  --  2.5  --   HGB 10.9* 11.6* 8.4* 8.1*  HCT 36.2 34.0* 27.9* 26.2*  MCV 98.9  --  97.2  --   PLT 379  --  310  --    Cardiac Enzymes: No results for input(s): CKTOTAL, CKMB, CKMBINDEX, TROPONINI in the last 168 hours. BNP: BNP (last 3 results) Recent Labs    05/22/19 1633 06/27/19 1425  BNP >4,500.0* 4,114.3*    ProBNP (last 3 results) No results for input(s): PROBNP in the last 8760 hours.  CBG: Recent Labs  Lab 07/03/19 0625 07/03/19 1135 07/03/19 1654 07/03/19 2140 07/04/19 0622  GLUCAP 132* 117* 136* 165* 125*       Signed:  Irine Seal MD.  [facility] 07/04/2019, 10:04 AM

## 2019-07-04 NOTE — Progress Notes (Signed)
[facility]: [place] to pt's brother Clare Gandy he states that he is under the impression as of last night pt will be coming home today. He said he was waiting to hear. He wants to take her home by car. He was made aware that he will need to bring the portable oxygen tank with him so she will have oxygen to get home and he verbalizes that will not be a problem.   If pt is felt stable and MD wants to discharged today we are prepared to follow up with them after getting home. We tentatively have a nurse scheduled for 200-230pm.   Webb Silversmith RN [phone]

## 2019-07-06 LAB — URINE CULTURE: Culture: 10000 — AB

## 2019-07-22 ENCOUNTER — Encounter: Payer: Self-pay | Admitting: General Practice

## 2020-02-25 ENCOUNTER — Inpatient Hospital Stay (HOSPITAL_COMMUNITY)
Admission: EM | Admit: 2020-02-25 | Discharge: 2020-03-03 | DRG: 300 | Disposition: A | Discharge status: Hospice | Attending: Family Medicine | Admitting: Family Medicine

## 2020-02-25 ENCOUNTER — Emergency Department (HOSPITAL_COMMUNITY)

## 2020-02-25 DIAGNOSIS — E860 Dehydration: Secondary | ICD-10-CM | POA: Diagnosis present

## 2020-02-25 DIAGNOSIS — E871 Hypo-osmolality and hyponatremia: Secondary | ICD-10-CM | POA: Diagnosis present

## 2020-02-25 DIAGNOSIS — Z20822 Contact with and (suspected) exposure to covid-19: Secondary | ICD-10-CM | POA: Diagnosis present

## 2020-02-25 DIAGNOSIS — E1122 Type 2 diabetes mellitus with diabetic chronic kidney disease: Secondary | ICD-10-CM | POA: Diagnosis present

## 2020-02-25 DIAGNOSIS — I70229 Atherosclerosis of native arteries of extremities with rest pain, unspecified extremity: Secondary | ICD-10-CM | POA: Diagnosis not present

## 2020-02-25 DIAGNOSIS — I2510 Atherosclerotic heart disease of native coronary artery without angina pectoris: Secondary | ICD-10-CM | POA: Diagnosis present

## 2020-02-25 DIAGNOSIS — Z87891 Personal history of nicotine dependence: Secondary | ICD-10-CM

## 2020-02-25 DIAGNOSIS — Z833 Family history of diabetes mellitus: Secondary | ICD-10-CM

## 2020-02-25 DIAGNOSIS — Z90710 Acquired absence of both cervix and uterus: Secondary | ICD-10-CM

## 2020-02-25 DIAGNOSIS — I130 Hypertensive heart and chronic kidney disease with heart failure and stage 1 through stage 4 chronic kidney disease, or unspecified chronic kidney disease: Secondary | ICD-10-CM | POA: Diagnosis present

## 2020-02-25 DIAGNOSIS — N179 Acute kidney failure, unspecified: Secondary | ICD-10-CM | POA: Diagnosis present

## 2020-02-25 DIAGNOSIS — Z7982 Long term (current) use of aspirin: Secondary | ICD-10-CM

## 2020-02-25 DIAGNOSIS — Z66 Do not resuscitate: Secondary | ICD-10-CM | POA: Diagnosis present

## 2020-02-25 DIAGNOSIS — N184 Chronic kidney disease, stage 4 (severe): Secondary | ICD-10-CM | POA: Diagnosis present

## 2020-02-25 DIAGNOSIS — I5022 Chronic systolic (congestive) heart failure: Secondary | ICD-10-CM

## 2020-02-25 DIAGNOSIS — I70263 Atherosclerosis of native arteries of extremities with gangrene, bilateral legs: Secondary | ICD-10-CM | POA: Diagnosis present

## 2020-02-25 DIAGNOSIS — L97519 Non-pressure chronic ulcer of other part of right foot with unspecified severity: Secondary | ICD-10-CM | POA: Diagnosis present

## 2020-02-25 DIAGNOSIS — E1152 Type 2 diabetes mellitus with diabetic peripheral angiopathy with gangrene: Principal | ICD-10-CM | POA: Diagnosis present

## 2020-02-25 DIAGNOSIS — E1169 Type 2 diabetes mellitus with other specified complication: Secondary | ICD-10-CM | POA: Diagnosis present

## 2020-02-25 DIAGNOSIS — R627 Adult failure to thrive: Secondary | ICD-10-CM | POA: Diagnosis present

## 2020-02-25 DIAGNOSIS — E861 Hypovolemia: Secondary | ICD-10-CM | POA: Diagnosis present

## 2020-02-25 DIAGNOSIS — I959 Hypotension, unspecified: Secondary | ICD-10-CM | POA: Diagnosis present

## 2020-02-25 DIAGNOSIS — E119 Type 2 diabetes mellitus without complications: Secondary | ICD-10-CM | POA: Diagnosis not present

## 2020-02-25 DIAGNOSIS — E8809 Other disorders of plasma-protein metabolism, not elsewhere classified: Secondary | ICD-10-CM | POA: Diagnosis present

## 2020-02-25 DIAGNOSIS — I6529 Occlusion and stenosis of unspecified carotid artery: Secondary | ICD-10-CM | POA: Diagnosis present

## 2020-02-25 DIAGNOSIS — E785 Hyperlipidemia, unspecified: Secondary | ICD-10-CM | POA: Diagnosis present

## 2020-02-25 DIAGNOSIS — Z79899 Other long term (current) drug therapy: Secondary | ICD-10-CM

## 2020-02-25 DIAGNOSIS — I10 Essential (primary) hypertension: Secondary | ICD-10-CM | POA: Diagnosis not present

## 2020-02-25 DIAGNOSIS — M869 Osteomyelitis, unspecified: Secondary | ICD-10-CM

## 2020-02-25 DIAGNOSIS — I7025 Atherosclerosis of native arteries of other extremities with ulceration: Secondary | ICD-10-CM | POA: Diagnosis not present

## 2020-02-25 DIAGNOSIS — E11621 Type 2 diabetes mellitus with foot ulcer: Secondary | ICD-10-CM | POA: Diagnosis present

## 2020-02-25 DIAGNOSIS — M86672 Other chronic osteomyelitis, left ankle and foot: Secondary | ICD-10-CM | POA: Diagnosis not present

## 2020-02-25 DIAGNOSIS — I739 Peripheral vascular disease, unspecified: Secondary | ICD-10-CM | POA: Diagnosis present

## 2020-02-25 DIAGNOSIS — Z6826 Body mass index (BMI) 26.0-26.9, adult: Secondary | ICD-10-CM

## 2020-02-25 DIAGNOSIS — Z888 Allergy status to other drugs, medicaments and biological substances status: Secondary | ICD-10-CM

## 2020-02-25 DIAGNOSIS — E1140 Type 2 diabetes mellitus with diabetic neuropathy, unspecified: Secondary | ICD-10-CM | POA: Diagnosis present

## 2020-02-25 DIAGNOSIS — C20 Malignant neoplasm of rectum: Secondary | ICD-10-CM | POA: Diagnosis present

## 2020-02-25 DIAGNOSIS — L97511 Non-pressure chronic ulcer of other part of right foot limited to breakdown of skin: Secondary | ICD-10-CM | POA: Diagnosis not present

## 2020-02-25 DIAGNOSIS — Z7901 Long term (current) use of anticoagulants: Secondary | ICD-10-CM

## 2020-02-25 DIAGNOSIS — Z515 Encounter for palliative care: Secondary | ICD-10-CM

## 2020-02-25 DIAGNOSIS — Z86718 Personal history of other venous thrombosis and embolism: Secondary | ICD-10-CM

## 2020-02-25 DIAGNOSIS — Z8249 Family history of ischemic heart disease and other diseases of the circulatory system: Secondary | ICD-10-CM

## 2020-02-25 DIAGNOSIS — Z7189 Other specified counseling: Secondary | ICD-10-CM | POA: Diagnosis not present

## 2020-02-25 DIAGNOSIS — Z923 Personal history of irradiation: Secondary | ICD-10-CM

## 2020-02-25 DIAGNOSIS — H2511 Age-related nuclear cataract, right eye: Secondary | ICD-10-CM | POA: Diagnosis present

## 2020-02-25 DIAGNOSIS — E440 Moderate protein-calorie malnutrition: Secondary | ICD-10-CM | POA: Diagnosis present

## 2020-02-25 DIAGNOSIS — Z7984 Long term (current) use of oral hypoglycemic drugs: Secondary | ICD-10-CM

## 2020-02-25 DIAGNOSIS — L97529 Non-pressure chronic ulcer of other part of left foot with unspecified severity: Secondary | ICD-10-CM | POA: Diagnosis present

## 2020-02-25 DIAGNOSIS — Z933 Colostomy status: Secondary | ICD-10-CM

## 2020-02-25 DIAGNOSIS — Z9221 Personal history of antineoplastic chemotherapy: Secondary | ICD-10-CM

## 2020-02-25 DIAGNOSIS — L89621 Pressure ulcer of left heel, stage 1: Secondary | ICD-10-CM | POA: Diagnosis present

## 2020-02-25 DIAGNOSIS — D509 Iron deficiency anemia, unspecified: Secondary | ICD-10-CM | POA: Diagnosis present

## 2020-02-25 DIAGNOSIS — Z9889 Other specified postprocedural states: Secondary | ICD-10-CM | POA: Diagnosis not present

## 2020-02-25 DIAGNOSIS — Z91018 Allergy to other foods: Secondary | ICD-10-CM

## 2020-02-25 DIAGNOSIS — E039 Hypothyroidism, unspecified: Secondary | ICD-10-CM | POA: Diagnosis present

## 2020-02-25 DIAGNOSIS — L97419 Non-pressure chronic ulcer of right heel and midfoot with unspecified severity: Secondary | ICD-10-CM | POA: Diagnosis present

## 2020-02-25 DIAGNOSIS — K219 Gastro-esophageal reflux disease without esophagitis: Secondary | ICD-10-CM | POA: Diagnosis present

## 2020-02-25 DIAGNOSIS — Z9119 Patient's noncompliance with other medical treatment and regimen: Secondary | ICD-10-CM

## 2020-02-25 DIAGNOSIS — Z79890 Hormone replacement therapy (postmenopausal): Secondary | ICD-10-CM

## 2020-02-25 LAB — CBC WITH DIFFERENTIAL/PLATELET
Abs Immature Granulocytes: 0 10*3/uL (ref 0.00–0.07)
Basophils Absolute: 0 10*3/uL (ref 0.0–0.1)
Basophils Relative: 0 %
Eosinophils Absolute: 0 10*3/uL (ref 0.0–0.5)
Eosinophils Relative: 0 %
HCT: 28.1 % — ABNORMAL LOW (ref 36.0–46.0)
Hemoglobin: 9.5 g/dL — ABNORMAL LOW (ref 12.0–15.0)
Lymphocytes Relative: 4 %
Lymphs Abs: 0.4 10*3/uL — ABNORMAL LOW (ref 0.7–4.0)
MCH: 33.3 pg (ref 26.0–34.0)
MCHC: 33.8 g/dL (ref 30.0–36.0)
MCV: 98.6 fL (ref 80.0–100.0)
Monocytes Absolute: 0.4 10*3/uL (ref 0.1–1.0)
Monocytes Relative: 4 %
Neutro Abs: 8.5 10*3/uL — ABNORMAL HIGH (ref 1.7–7.7)
Neutrophils Relative %: 92 %
Platelets: 441 10*3/uL — ABNORMAL HIGH (ref 150–400)
RBC: 2.85 MIL/uL — ABNORMAL LOW (ref 3.87–5.11)
RDW: 13.8 % (ref 11.5–15.5)
WBC: 9.2 10*3/uL (ref 4.0–10.5)
nRBC: 0 % (ref 0.0–0.2)
nRBC: 0 /100 WBC

## 2020-02-25 LAB — COMPREHENSIVE METABOLIC PANEL
ALT: 7 U/L (ref 0–44)
AST: 15 U/L (ref 15–41)
Albumin: 1.8 g/dL — ABNORMAL LOW (ref 3.5–5.0)
Alkaline Phosphatase: 120 U/L (ref 38–126)
Anion gap: 13 (ref 5–15)
BUN: 67 mg/dL — ABNORMAL HIGH (ref 8–23)
CO2: 24 mmol/L (ref 22–32)
Calcium: 7.6 mg/dL — ABNORMAL LOW (ref 8.9–10.3)
Chloride: 94 mmol/L — ABNORMAL LOW (ref 98–111)
Creatinine, Ser: 4.37 mg/dL — ABNORMAL HIGH (ref 0.44–1.00)
GFR, Estimated: 10 mL/min — ABNORMAL LOW (ref >60)
Glucose, Bld: 204 mg/dL — ABNORMAL HIGH (ref 70–99)
Potassium: 3.2 mmol/L — ABNORMAL LOW (ref 3.5–5.1)
Sodium: 131 mmol/L — ABNORMAL LOW (ref 135–145)
Total Bilirubin: 0.7 mg/dL (ref 0.3–1.2)
Total Protein: 6.2 g/dL — ABNORMAL LOW (ref 6.5–8.1)

## 2020-02-25 LAB — TSH: TSH: 2.391 u[IU]/mL (ref 0.350–4.500)

## 2020-02-25 LAB — SARS CORONAVIRUS 2 (TAT 6-24 HRS): SARS Coronavirus 2: NEGATIVE

## 2020-02-25 LAB — LACTIC ACID, PLASMA: Lactic Acid, Venous: 1.4 mmol/L (ref 0.5–1.9)

## 2020-02-25 LAB — CBG MONITORING, ED: Glucose-Capillary: 175 mg/dL — ABNORMAL HIGH (ref 70–99)

## 2020-02-25 MED ORDER — DRONABINOL 2.5 MG PO CAPS
2.5000 mg | ORAL_CAPSULE | Freq: Two times a day (BID) | ORAL | Status: DC
  Administered 2020-02-26 – 2020-03-01 (×7): 2.5 mg via ORAL
  Filled 2020-02-25 (×8): qty 1

## 2020-02-25 MED ORDER — TRIAMCINOLONE ACETONIDE 55 MCG/ACT NA AERO: 1.0000 | INHALATION_SPRAY | Freq: Every day | NASAL | Status: DC | PRN

## 2020-02-25 MED ORDER — SODIUM CHLORIDE 0.9 % IV SOLN: INTRAVENOUS | Status: DC

## 2020-02-25 MED ORDER — GABAPENTIN 300 MG PO CAPS
300.0000 mg | ORAL_CAPSULE | Freq: Two times a day (BID) | ORAL | Status: DC
  Administered 2020-02-25 – 2020-03-01 (×11): 300 mg via ORAL
  Filled 2020-02-25 (×11): qty 1

## 2020-02-25 MED ORDER — VITAMIN D3 50 MCG (2000 UT) PO TABS: 4000.0000 [IU] | ORAL_TABLET | Freq: Every day | ORAL | Status: DC

## 2020-02-25 MED ORDER — LORATADINE 10 MG PO TABS
10.0000 mg | ORAL_TABLET | Freq: Every day | ORAL | Status: DC
  Administered 2020-02-26 – 2020-03-01 (×5): 10 mg via ORAL
  Filled 2020-02-25 (×5): qty 1

## 2020-02-25 MED ORDER — PSYLLIUM 51.7 % PO PACK: PACK | ORAL | Status: DC

## 2020-02-25 MED ORDER — ACETAMINOPHEN 325 MG PO TABS
650.0000 mg | ORAL_TABLET | Freq: Four times a day (QID) | ORAL | Status: DC | PRN
  Administered 2020-02-26 – 2020-03-01 (×3): 650 mg via ORAL
  Filled 2020-02-25 (×3): qty 2

## 2020-02-25 MED ORDER — LINAGLIPTIN 5 MG PO TABS: 5.0000 mg | ORAL_TABLET | Freq: Every day | ORAL | Status: DC

## 2020-02-25 MED ORDER — ALBUTEROL SULFATE HFA 108 (90 BASE) MCG/ACT IN AERS: 2.0000 | INHALATION_SPRAY | Freq: Four times a day (QID) | RESPIRATORY_TRACT | Status: DC | PRN

## 2020-02-25 MED ORDER — TROLAMINE SALICYLATE 10 % EX LOTN: TOPICAL_LOTION | Freq: Every day | CUTANEOUS | Status: DC | PRN

## 2020-02-25 MED ORDER — SODIUM BICARBONATE 650 MG PO TABS
650.0000 mg | ORAL_TABLET | Freq: Three times a day (TID) | ORAL | Status: DC
  Administered 2020-02-25 – 2020-03-01 (×16): 650 mg via ORAL
  Filled 2020-02-25 (×17): qty 1

## 2020-02-25 MED ORDER — CALCIUM CARBONATE-VITAMIN D 600-200 MG-UNIT PO TABS: ORAL_TABLET | Freq: Every day | ORAL | Status: DC

## 2020-02-25 MED ORDER — ATORVASTATIN CALCIUM 10 MG PO TABS
10.0000 mg | ORAL_TABLET | Freq: Every day | ORAL | Status: DC
  Administered 2020-02-25 – 2020-03-01 (×6): 10 mg via ORAL
  Filled 2020-02-25 (×6): qty 1

## 2020-02-25 MED ORDER — POLYSACCHARIDE IRON COMPLEX 150 MG PO CAPS: 150.0000 mg | ORAL_CAPSULE | Freq: Two times a day (BID) | ORAL | Status: DC

## 2020-02-25 MED ORDER — VITAMIN B-12 1000 MCG PO TABS: 500.0000 ug | ORAL_TABLET | Freq: Every day | ORAL | Status: DC

## 2020-02-25 MED ORDER — NAPHAZOLINE-GLYCERIN 0.012-0.2 % OP SOLN: 1.0000 [drp] | Freq: Four times a day (QID) | OPHTHALMIC | Status: DC | PRN

## 2020-02-25 MED ORDER — ACETAMINOPHEN 650 MG RE SUPP: 650.0000 mg | Freq: Four times a day (QID) | RECTAL | Status: DC | PRN

## 2020-02-25 MED ORDER — HYDROMORPHONE HCL 1 MG/ML IJ SOLN
0.5000 mg | INTRAMUSCULAR | Status: DC | PRN
  Administered 2020-02-25: 1 mg via INTRAVENOUS
  Filled 2020-02-25: qty 1

## 2020-02-25 MED ORDER — TRAMADOL HCL 50 MG PO TABS
50.0000 mg | ORAL_TABLET | Freq: Two times a day (BID) | ORAL | Status: DC | PRN
  Administered 2020-02-25 – 2020-02-27 (×2): 50 mg via ORAL
  Filled 2020-02-25 (×2): qty 1

## 2020-02-25 MED ORDER — ENSURE ENLIVE PO LIQD
237.0000 mL | Freq: Two times a day (BID) | ORAL | Status: DC
  Administered 2020-02-26 – 2020-03-03 (×6): 237 mL via ORAL
  Filled 2020-02-25: qty 237

## 2020-02-25 MED ORDER — HYDRALAZINE HCL 25 MG PO TABS: 25.0000 mg | ORAL_TABLET | Freq: Four times a day (QID) | ORAL | Status: DC | PRN

## 2020-02-25 MED ORDER — SODIUM CHLORIDE 0.9 % IV SOLN
2.0000 g | INTRAVENOUS | Status: DC
  Administered 2020-02-25 – 2020-03-01 (×6): 2 g via INTRAVENOUS
  Filled 2020-02-25 (×6): qty 2

## 2020-02-25 MED ORDER — INSULIN ASPART 100 UNIT/ML ~~LOC~~ SOLN
0.0000 [IU] | Freq: Three times a day (TID) | SUBCUTANEOUS | Status: DC
  Administered 2020-02-26 (×2): 2 [IU] via SUBCUTANEOUS
  Administered 2020-02-29 (×2): 1 [IU] via SUBCUTANEOUS
  Administered 2020-02-29: 2 [IU] via SUBCUTANEOUS
  Administered 2020-03-01 (×3): 1 [IU] via SUBCUTANEOUS

## 2020-02-25 MED ORDER — VANCOMYCIN VARIABLE DOSE PER UNSTABLE RENAL FUNCTION (PHARMACIST DOSING): Status: DC

## 2020-02-25 MED ORDER — PANTOPRAZOLE SODIUM 40 MG PO TBEC
40.0000 mg | DELAYED_RELEASE_TABLET | Freq: Every day | ORAL | Status: DC
  Administered 2020-02-26 – 2020-03-01 (×5): 40 mg via ORAL
  Filled 2020-02-25 (×5): qty 1

## 2020-02-25 MED ORDER — APIXABAN 5 MG PO TABS
5.0000 mg | ORAL_TABLET | Freq: Two times a day (BID) | ORAL | Status: DC
  Administered 2020-02-25 – 2020-02-26 (×3): 5 mg via ORAL
  Filled 2020-02-25 (×3): qty 1

## 2020-02-25 MED ORDER — LEVOTHYROXINE SODIUM 50 MCG PO TABS
50.0000 ug | ORAL_TABLET | Freq: Every day | ORAL | Status: DC
  Administered 2020-02-27 – 2020-03-02 (×5): 50 ug via ORAL
  Filled 2020-02-25 (×5): qty 1

## 2020-02-25 MED ORDER — VANCOMYCIN HCL 1500 MG/300ML IV SOLN
1500.0000 mg | Freq: Once | INTRAVENOUS | Status: AC
  Administered 2020-02-25: 1500 mg via INTRAVENOUS
  Filled 2020-02-25: qty 300

## 2020-02-25 MED ORDER — ASPIRIN EC 81 MG PO TBEC
81.0000 mg | DELAYED_RELEASE_TABLET | Freq: Every day | ORAL | Status: DC
  Administered 2020-02-26 – 2020-03-01 (×5): 81 mg via ORAL
  Filled 2020-02-25 (×5): qty 1

## 2020-02-25 MED ORDER — POTASSIUM CHLORIDE CRYS ER 20 MEQ PO TBCR
20.0000 meq | EXTENDED_RELEASE_TABLET | Freq: Once | ORAL | Status: AC
  Administered 2020-02-25: 20 meq via ORAL
  Filled 2020-02-25: qty 1

## 2020-02-25 MED ORDER — ISOSORBIDE MONONITRATE ER 30 MG PO TB24
30.0000 mg | ORAL_TABLET | Freq: Every day | ORAL | Status: DC
  Administered 2020-02-27 – 2020-02-28 (×2): 30 mg via ORAL
  Filled 2020-02-25 (×3): qty 1

## 2020-02-25 MED ORDER — SODIUM CHLORIDE 0.9 % IV SOLN
2.0000 g | Freq: Once | INTRAVENOUS | Status: AC
  Administered 2020-02-25: 2 g via INTRAVENOUS
  Filled 2020-02-25: qty 20

## 2020-02-25 NOTE — ED Notes (Signed)
Spoke with pharmacy regarding pts home meds, meds need to be reconciled prior.

## 2020-02-25 NOTE — ED Triage Notes (Signed)
Per ems pt is non compliant with care/medications. Patient has severe edema in the legs.wheeping Wounds on both patients left and right foot. Pt is on hospice. Per hospice they were doing topical antibiotics on the wounds. Patient is alert and oriented x4. Also trying to get patient in a facility because she can no longer take care of herself. Pt lives by herself.

## 2020-02-25 NOTE — Progress Notes (Signed)
Pharmacy Antibiotic Note  Nicole Sellers is a 70 y.o. female admitted on 02/25/2020 with wound infection on her right heel and bilateral toes.  She states wounds have worsened with swelling, purulent drainage, and foul smell.  Patient has stage IV chronic kidney disease not on hemodialysis.  Baseline creatinine ~2; on admit Scr 4.37.  X-ray showed osteomyelitis of the toes.  Pharmacy has been consulted for vancomycin and cefepime dosing.  WBC 9.2, lactate 1.4, Tm 98.5  Plan: Cefepime 2g IV q24 hours  Vancomycin 1500 mg IV x1  Will not order maintenance doses at this time given unstable renal function Monitor renal function, clinical improvement, vanc levels as indicated  Height: 5\' 6"  (167.6 cm) Weight: 74.8 kg (165 lb) IBW/kg (Calculated) : 59.3  Temp (24hrs), Avg:98.5 F (36.9 C), Min:98.5 F (36.9 C), Max:98.5 F (36.9 C)  Recent Labs  Lab 02/25/20 1717  WBC 9.2  CREATININE 4.37*  LATICACIDVEN 1.4    Estimated Creatinine Clearance: 12 mL/min (A) (by C-G formula based on SCr of 4.37 mg/dL (H)).    Allergies  Allergen Reactions  . Kiwi Extract Itching and Other (See Comments)    Throat itching   . Lisinopril Other (See Comments)    Bruising   . Plavix [Clopidogrel] Other (See Comments)    "Made me feel badly- didn't agree with my system"    Antimicrobials this admission: Ceftriaxone 2/15 >> 2/15 Vancomycin 2/15 >>  Cefepime 2/15 >>   Microbiology results: None this admit  Thank you for allowing pharmacy to be a part of this patient's care.  Dimple Nanas, PharmD PGY-1 Acute Care Pharmacy Resident Office: [phone] 02/25/2020 7:30 PM

## 2020-02-25 NOTE — ED Provider Notes (Signed)
[facility] EMERGENCY DEPARTMENT Provider Note   CSN: [id number] Arrival date & time: 02/25/20  1653     History Chief Complaint  Patient presents with  . Wound Infection    [name] is a 70 y.o. female.  HPI   Chief complaint of infected feet, this has been gradual in onset persistent, ongoing for several months, gradually worsening and now become severe without associated fever   This is a 70 year old female, she has a known history of stage IV chronic kidney disease, colon cancer, history of DVTs, hypertension, hypothyroidism, she has peripheral arterial disease and chronic swelling of her legs and she has a diabetic type II.  The patient is also known to have some congestive heart failure.  She has home hospice that is working with her and possibly a doctor's office to get her placed and advanced assisted living or nursing facility because of her progressive conditions.  The patient reports to me that it has been about 3 days since she has been out of bed, she was able to transition from the bed to the bedside commode this morning but with great difficulty and is making very little urine.  She is becoming weaker and weaker and has had increasing swelling of her legs, increasing wounds of her feet including her right heel and the bilateral toes.  She reports that they are now smelling bad, she has had a hospice nurse coming to the house to change her dressings but of note these have become worsened with increasing swelling, purulent drainage, foul smell.  The patient reports that she has some discomfort of her feet but is more put off by the smell and the swelling.  She has no fevers chills coughing or shortness of breath.  She has not had any diarrhea.  Sx are persistent and gradually worsening.  Has not seen her PCP at [facility] recently.  She is non compliant with her medicines including lasix and has family around that doesn't come over very  often.  Past Medical History:  Diagnosis Date  . Age-related nuclear cataract, right eye   . Allergy   . Anemia   . Arthritis    "joints sometimes" (12/25/2012)  . CHF (congestive heart failure) ([place])   . Chronic kidney disease    stage 4  . Clotting disorder ([place])   . Colon cancer ([place])   . Critical lower limb ischemia   . Deep vein thrombosis (DVT) ([place])   . Diverticulosis   . Gangrene of toe, Rt second toe 12/25/2012  . GERD (gastroesophageal reflux disease)   . High cholesterol   . Hypertension   . Hypothyroidism   . PAD (peripheral artery disease) ([place])   . PAD (peripheral artery disease) ([place])   . Peripheral neuropathy   . Rectal cancer ([place])   . S/P arterial stent, 12/25/13, successful diamondback orbital rotational arthrectomy, PTA using chocolate  balloon and stenting using I DEV stent of long segment calcified high-grade proximal and mid r 12/25/2012  . Tobacco abuse   . Type II diabetes mellitus [facility])     Patient Active Problem List   Diagnosis Date Noted  . Acute on chronic respiratory failure with hypoxia ([place]) 07/03/2019  . Pleural effusion on left   . Hypothyroidism   . DNR (do not resuscitate)   . Malnutrition of moderate degree 06/28/2019  . CHF (congestive heart failure) ([place]) 06/27/2019  . Respiratory distress   . Stage 5 chronic kidney disease not  on chronic dialysis ([place])   . Palliative care by specialist   . Goals of care, counseling/discussion   . General weakness   . CKD (chronic kidney disease) stage 4, GFR 15-29 ml/min (HCC) 05/21/2019  . Fluid overload 05/21/2019  . Acute congestive heart failure ([place]) 05/21/2019  . Anemia of chronic disease 05/21/2019  . Transaminitis 05/21/2019  . Acute kidney injury ([place]) 03/29/2019  . DVT (deep venous thrombosis) ([place]) 03/29/2019  . Oral candidiasis   . AKI (acute kidney injury) ([place]) 10/01/2018  . Hypokalemia 10/01/2018  . Pyuria 10/01/2018  . Odynophagia 10/01/2018  . Hyponatremia 10/01/2018  .  Right sided weakness 10/01/2018  . SBO (small bowel obstruction) ([place]) 08/22/2018  . Dehydration 08/21/2018  . Pre-operative cardiovascular examination 06/07/2018  . Rectal cancer ([place]) 11/06/2017  . Claudication in peripheral vascular disease ([place]) 05/08/2017  . Carotid artery disease ([place]) 04/14/2017  . PAD (peripheral artery disease) Rt ABI 0.7, Lt ABI 0.46 12/25/2012  . S/P arterial stent, 12/25/13, successful diamondback orbital rotational arthrectomy, PTA using chocolate  balloon and stenting using I DEV stent of long segment calcified high-grade proximal and mid r 12/25/2012  . Gangrene of toe, Rt second toe 12/25/2012  . Essential hypertension 12/13/2012  . Non-insulin treated type 2 diabetes mellitus ([place]) 12/13/2012  . Critical lower limb ischemia ([place]) 12/13/2012  . Tobacco abuse 12/13/2012    Past Surgical History:  Procedure Laterality Date  . ABDOMINAL AORTAGRAM  12/20/2012   Procedure: ABDOMINAL AORTAGRAM;  Surgeon: Lorretta Harp, MD;  Location: [facility] CATH LAB;  Service: Cardiovascular;;  . ABDOMINAL HYSTERECTOMY  2000  . ANGIOPLASTY / STENTING FEMORAL Right 12/25/2012  . ATHERECTOMY Right 12/25/2012   Procedure: ATHERECTOMY;  Surgeon: Lorretta Harp, MD;  Location: [facility] CATH LAB;  Service: Cardiovascular;  Laterality: Right;  right SFA  . COLONOSCOPY    . DIVERTING ILEOSTOMY N/A 08/03/2018   Procedure: DIVERTING LOOP ILEOSTOMY;  Surgeon: Leighton Ruff, MD;  Location: [facility] ORS;  Service: General;  Laterality: N/A;  . ESOPHAGOGASTRODUODENOSCOPY N/A 10/04/2018   Procedure: ESOPHAGOGASTRODUODENOSCOPY (EGD);  Surgeon: Carol Ada, MD;  Location: [facility] ENDOSCOPY;  Service: Endoscopy;  Laterality: N/A;  . EYE SURGERY Left    cataract  . HERNIA REPAIR    . IR THORACENTESIS ASP PLEURAL SPACE W/IMG GUIDE  07/02/2019  . IR THORACENTESIS ASP PLEURAL SPACE W/IMG GUIDE  07/03/2019  . LOWER EXTREMITY ANGIOGRAM N/A 12/20/2012   Procedure: LOWER EXTREMITY ANGIOGRAM;  Surgeon: Lorretta Harp, MD;  Location: [facility] CATH LAB;  Service: Cardiovascular;  Laterality: N/A;  . LOWER EXTREMITY ANGIOGRAM N/A 12/25/2012   Procedure: LOWER EXTREMITY ANGIOGRAM;  Surgeon: Lorretta Harp, MD;  Location[facility] CATH LAB;  Service: Cardiovascular;  Laterality: N/A;  . LOWER EXTREMITY INTERVENTION  05/08/2017  . LOWER EXTREMITY INTERVENTION Bilateral 05/08/2017   Procedure: LOWER EXTREMITY INTERVENTION;  Surgeon: Lorretta Harp, MD;  Location: [facility] CV LAB;  Service: Cardiovascular;  Laterality: Bilateral;  . PERIPHERAL VASCULAR BALLOON ANGIOPLASTY Right 05/08/2017   Procedure: PERIPHERAL VASCULAR BALLOON ANGIOPLASTY;  Surgeon: Lorretta Harp, MD;  Location: [place] [facility] CV LAB;  Service: Cardiovascular;  Laterality: Right;  sfa  . PERIPHERAL VASCULAR INTERVENTION Right 05/08/2017   Procedure: PERIPHERAL VASCULAR INTERVENTION;  Surgeon: Lorretta Harp, MD;  Location: [facility] CV LAB;  Service: Cardiovascular;  Laterality: Right;  ext iliac  . PORTACATH PLACEMENT Right 11/22/2017   Procedure: INSERTION PORT-A-CATH;  Surgeon: Leighton Ruff, MD;  Location: [facility] ORS;  Service: General;  Laterality: Right;  .  SAVORY DILATION N/A 10/04/2018   Procedure: SAVORY DILATION;  Surgeon: Carol Ada, MD;  Location: [facility] ENDOSCOPY;  Service: Endoscopy;  Laterality: N/A;  . TOE SURGERY Right    2n toe   . UMBILICAL HERNIA REPAIR  2000  . UMBILICAL HERNIA REPAIR N/A 08/03/2018   Procedure: UMBILICAL HERNIA REPAIR;  Surgeon: Leighton Ruff, MD;  Location: [facility] ORS;  Service: General;  Laterality: N/A;  . XI ROBOTIC ASSISTED LOWER ANTERIOR RESECTION N/A 08/03/2018   Procedure: XI ROBOTIC ASSISTED LOWER ANTERIOR RESECTION; DIAGNOSTIC FLEXIBLE SIGMOIDOSCOPY; INTRAOPERATIVE ASSESSMENT OF VASCULAR PERFUSION;  Surgeon: Leighton Ruff, MD;  Location: [facility] ORS;  Service: General;  Laterality: N/A;     OB History   No obstetric history on file.     Family History  Problem Relation Age of Onset  . Hypertension  Mother   . Stroke Mother   . Cancer Father        prostate  . Mental illness Sister   . Diabetes Brother   . Hypertension Brother   . Cancer Brother 49       prostate  . Diabetes Brother   . Hypertension Brother   . Cancer Brother        "tumors on neck and body"     Social History   Tobacco Use  . Smoking status: Former Smoker    Packs/day: 0.50    Years: 48.00    Pack years: 24.00    Types: Cigarettes  . Smokeless tobacco: Never Used  . Tobacco comment: quit in 2016  Vaping Use  . Vaping Use: Never used  Substance Use Topics  . Alcohol use: No    Alcohol/week: 0.0 standard drinks  . Drug use: No    Home Medications Prior to Admission medications   Medication Sig Start Date End Date Taking? Authorizing Provider  albuterol (PROVENTIL HFA;VENTOLIN HFA) 108 (90 BASE) MCG/ACT inhaler Inhale 2 puffs into the lungs every 6 (six) hours as needed for wheezing or shortness of breath.    [provider]  apixaban (ELIQUIS) 5 MG TABS tablet Take 1 tablet (5 mg total) by mouth 2 (two) times daily. 07/04/19   Eugenie Filler, MD  aspirin 81 MG tablet Take 81 mg by mouth daily.    [provider]  atorvastatin (LIPITOR) 10 MG tablet Take 1 tablet (10 mg total) by mouth daily. Patient taking differently: Take 10 mg by mouth at bedtime.  01/01/18   Minette Brine, FNP  Calcium Carbonate-Vitamin D (CALCIUM 600+D PO) Take 1 tablet by mouth daily.    [provider]  carvedilol (COREG) 3.125 MG tablet Take 1 tablet (3.125 mg total) by mouth 2 (two) times daily with a meal. 07/04/19   Eugenie Filler, MD  cetirizine (ZYRTEC) 10 MG tablet Take 10 mg by mouth daily.    [provider]  Cholecalciferol (VITAMIN D3) 50 MCG (2000 UT) TABS Take 4,000 Units by mouth daily.    [provider]  FERREX 150 150 MG capsule Take 150 mg by mouth 2 (two) times daily. 08/29/17   [provider]  gabapentin (NEURONTIN) 300 MG capsule Take 300 mg by  mouth 2 (two) times daily.     [provider]  hydrALAZINE (APRESOLINE) 50 MG tablet Take 1 tablet (50 mg total) by mouth every 6 (six) hours. 07/04/19   Eugenie Filler, MD  isosorbide mononitrate (IMDUR) 30 MG 24 hr tablet Take 1 tablet (30 mg total) by mouth daily. 07/05/19   [place] Silos,  Malachy Moan, MD  levothyroxine (SYNTHROID) 50 MCG tablet Take 1 tablet (50 mcg total) by mouth daily. 07/04/19   Eugenie Filler, MD  Multiple Vitamins-Minerals (SENIOR MULTIVITAMIN PLUS PO) Take 1 tablet by mouth daily with breakfast.    [provider]  nitroGLYCERIN (NITROSTAT) 0.4 MG SL tablet Place 1 tablet (0.4 mg total) under the tongue every 5 (five) minutes as needed for chest pain. 07/04/19   Eugenie Filler, MD  pantoprazole (PROTONIX) 40 MG tablet Take 1 tablet (40 mg total) by mouth daily before breakfast. 07/05/19   Eugenie Filler, MD  Psyllium (METAMUCIL FIBER PO) Take 1 Scoop by mouth See admin instructions. Mix 1 scoopful into 6-8 ounces of desired beverage and drink two times a day    [provider]  sitaGLIPtin (JANUVIA) 25 MG tablet Take 1 tablet (25 mg total) by mouth daily. 04/04/19   Donne Hazel, MD  sodium bicarbonate 650 MG tablet Take 1 tablet (650 mg total) by mouth 3 (three) times daily. 07/04/19   Eugenie Filler, MD  tetrahydrozoline-zinc (VISINE-AC) 0.05-0.25 % ophthalmic solution Place 1 drop into both eyes 2 (two) times daily as needed (dry eyes).    [provider]  torsemide (DEMADEX) 20 MG tablet Take 4 tablets (80 mg total) by mouth 2 (two) times daily. 07/04/19   Eugenie Filler, MD  traMADol (ULTRAM) 50 MG tablet Take 1 tablet (50 mg total) by mouth every 12 (twelve) hours as needed for moderate pain. 04/04/19   Donne Hazel, MD  Triamcinolone Acetonide (NASACORT ALLERGY 24HR NA) Place 1 spray into both nostrils daily as needed (allergies).     [provider]  Trolamine Salicylate (ASPERCREME EX) Apply 1 application  topically daily as needed (to affected areas, for joint pain).     [provider]  vitamin B-12 (CYANOCOBALAMIN) 500 MCG tablet Take 500 mcg by mouth daily.    [provider]    Allergies    Kiwi extract, Lisinopril, and Plavix [clopidogrel]  Review of Systems   Review of Systems  All other systems reviewed and are negative.   Physical Exam Updated Vital Signs BP 111/65   Pulse 82   Temp 98.5 F (36.9 C) (Oral)   Resp 11   Ht 1.676 m (5\' 6" )   Wt 74.8 kg   SpO2 96%   BMI 26.63 kg/m   Physical Exam Vitals and nursing note reviewed.  Constitutional:      General: She is not in acute distress.    Appearance: She is well-developed and well-nourished.  HENT:     Head: Normocephalic and atraumatic.     Mouth/Throat:     Mouth: Oropharynx is clear and moist.     Pharynx: No oropharyngeal exudate.  Eyes:     General: No scleral icterus.       Right eye: No discharge.        Left eye: No discharge.     Extraocular Movements: EOM normal.     Conjunctiva/sclera: Conjunctivae normal.     Pupils: Pupils are equal, round, and reactive to light.  Neck:     Thyroid: No thyromegaly.     Vascular: No JVD.  Cardiovascular:     Rate and Rhythm: Normal rate and regular rhythm.     Pulses: Intact distal pulses.     Heart sounds: Normal heart sounds. No murmur heard. No friction rub. No gallop.   Pulmonary:     Effort: Pulmonary effort is  normal. No respiratory distress.     Breath sounds: Normal breath sounds. No wheezing or rales.  Abdominal:     General: Bowel sounds are normal. There is no distension.     Palpations: Abdomen is soft. There is no mass.     Tenderness: There is no abdominal tenderness.  Musculoskeletal:        General: Tenderness present. Normal range of motion.     Cervical back: Normal range of motion and neck supple.     Right lower leg: Edema present.     Left lower leg: Edema present.     Comments: The patient is able to barely lift  either leg off of the bed an inch or 2.  She has multiple wounds to her feet  Lymphadenopathy:     Cervical: No cervical adenopathy.  Skin:    General: Skin is warm and dry.     Findings: Erythema, lesion and rash present.     Comments: There is open ulcers to her right heel and early also to the left heel, bilateral great toes and second toes are abnormal in appearance and palpation, there is some crepitance, there is some foul-smelling purulent drainage from the toes in the bilateral legs have edema and redness  Neurological:     Mental Status: She is alert.     Coordination: Coordination normal.  Psychiatric:        Mood and Affect: Mood and affect normal.        Behavior: Behavior normal.     ED Results / Procedures / Treatments   Labs (all labs ordered are listed, but only abnormal results are displayed) Labs Reviewed  CBC WITH DIFFERENTIAL/PLATELET - Abnormal; Notable for the following components:      Result Value   RBC 2.85 (*)    Hemoglobin 9.5 (*)    HCT 28.1 (*)    Platelets 441 (*)    Neutro Abs 8.5 (*)    Lymphs Abs 0.4 (*)    All other components within normal limits  COMPREHENSIVE METABOLIC PANEL - Abnormal; Notable for the following components:   Sodium 131 (*)    Potassium 3.2 (*)    Chloride 94 (*)    Glucose, Bld 204 (*)    BUN 67 (*)    Creatinine, Ser 4.37 (*)    Calcium 7.6 (*)    Total Protein 6.2 (*)    Albumin 1.8 (*)    GFR, Estimated 10 (*)    All other components within normal limits  SARS CORONAVIRUS 2 (TAT 6-24 HRS)  LACTIC ACID, PLASMA  URINALYSIS, ROUTINE W REFLEX MICROSCOPIC    EKG EKG Interpretation  Date/Time:  Tuesday February 25 2020 18:19:15 EST Ventricular Rate:  84 PR Interval:    QRS Duration: 146 QT Interval:  461 QTC Calculation: 545 R Axis:   138 Text Interpretation: Sinus rhythm Ventricular bigeminy Consider left ventricular hypertrophy Anterior Q waves, possibly due to LVH Abnormal T, consider ischemia, lateral  leads Prolonged QT interval Since last tracing rate slower Confirmed by Noemi Chapel [phone]) on 02/25/2020 6:21:05 PM   Radiology No results found.  Procedures .Critical Care Performed by: Noemi Chapel, MD Authorized by: Noemi Chapel, MD   Critical care provider statement:    Critical care time (minutes):  35   Critical care time was exclusive of:  Separately billable procedures and treating other patients and teaching time   Critical care was necessary to treat or prevent imminent or life-threatening deterioration of the  following conditions:  Renal failure   Critical care was time spent personally by me on the following activities:  Blood draw for specimens, development of treatment plan with patient or surrogate, discussions with consultants, evaluation of patient's response to treatment, examination of patient, obtaining history from patient or surrogate, ordering and performing treatments and interventions, ordering and review of laboratory studies, ordering and review of radiographic studies, pulse oximetry, re-evaluation of patient's condition and review of old charts     Medications Ordered in ED Medications  0.9 %  sodium chloride infusion ( Intravenous New Bag/Given 02/25/20 1733)  vancomycin (VANCOREADY) IVPB 1500 mg/300 mL (1,500 mg Intravenous New Bag/Given 02/25/20 1810)  cefTRIAXone (ROCEPHIN) 2 g in sodium chloride 0.9 % 100 mL IVPB (0 g Intravenous Stopped 02/25/20 1809)    ED Course  I have reviewed the triage vital signs and the nursing notes.  Pertinent labs & imaging results that were available during my care of the patient were reviewed by me and considered in my medical decision making (see chart for details).    MDM Rules/Calculators/A&P                          This patient appears systemically dehydrated, she has a blood pressure of 106/53, she is not tachycardic or febrile and has appearance of some necrotic toes has a combination of both peripheral  arterial disease as well as her edema and poorly controlled diabetes as well.  At this time she will need labs x-ray antibiotics and admission to the hospital.  She will also need to have further work to try to place her as she is not capable of living by herself at this time  The labs show that the patient has no leukocytosis however she does have anemia and her metabolic panel shows that she has an acute kidney injury with acute renal failure.  Creatinine is around 4.4, baseline is around 2, she is also moderately uremic.  I have personally viewed her x-rays and see that there is osteomyelitis of the toes of the foot, this is consistent with her clinical appearance.  Vancomycin, Rocephin, fluids.  Discussed with the hospitalist who will admit  Final Clinical Impression(s) / ED Diagnoses Final diagnoses:  Osteomyelitis of foot, unspecified laterality, unspecified type ([place])  Acute renal failure, unspecified acute renal failure type ([place])      Noemi Chapel, MD 02/25/20 1824

## 2020-02-25 NOTE — ED Notes (Signed)
Patient transported to X-ray 

## 2020-02-25 NOTE — H&P (Signed)
History and Physical    Nicole Sellers QMV:[id number] DOB: 29-Jul-1950 DOA: 02/25/2020  PCP: Adrian Prince, MD (Confirm with patient/family/NH records and if not entered, this has to be entered at [facility] point of entry) Patient coming from: Hom hospice  I have personally briefly reviewed patient's old medical records in [facility]  Chief Complaint: Foot pain  HPI: Nicole Sellers is a 70 y.o. female with medical history significant of IIDM, HTN, CKD stage IV, chronic PVD with chronic bilateral feet and legs wound, DVT on Eliquis, chronic systolic CHF with LVEF 20 to 25%, stage III rectal cancer status post chemoradiation and resection, who has been under home hospice care and wound care presented with worsening of bilateral feet pain and infection.  Patient was last evaluated by podiatrist more than 6 months ago, she has been under home hospice care with wound care 2 times a week for chronic legs and feet wound.  Which recently has progressively getting worse.  Especially the right big toe, patient claims she has been receiving pain meds but no antibiotics.  Denies any fever chills.  Patient also reported she has been having significant decrease of appetite but no abdominal pain nausea vomit or diarrhea.  He has been losing weight but does not know how much.  She has been only drinking Ensure 2 times a day but no appetite for solid food.  Denies any trouble swallowing.  ED Course: X-ray showed right big toe osteomyelitis, possible osteomyelitis on the left first distal phalanges.  Worsening of kidney function creatinine 4.3 compared to 2.0 baseline.  Review of Systems: As per HPI otherwise 14 point review of systems negative.    Past Medical History:  Diagnosis Date  . Age-related nuclear cataract, right eye   . Allergy   . Anemia   . Arthritis    "joints sometimes" (12/25/2012)  . CHF (congestive heart failure) (HCC)   . Chronic kidney disease    stage 4  . Clotting disorder  (HCC)   . Colon cancer (HCC)   . Critical lower limb ischemia   . Deep vein thrombosis (DVT) (HCC)   . Diverticulosis   . Gangrene of toe, Rt second toe 12/25/2012  . GERD (gastroesophageal reflux disease)   . High cholesterol   . Hypertension   . Hypothyroidism   . PAD (peripheral artery disease) (HCC)   . PAD (peripheral artery disease) (HCC)   . Peripheral neuropathy   . Rectal cancer (HCC)   . S/P arterial stent, 12/25/13, successful diamondback orbital rotational arthrectomy, PTA using chocolate  balloon and stenting using I DEV stent of long segment calcified high-grade proximal and mid r 12/25/2012  . Tobacco abuse   . Type II diabetes mellitus (HCC)     Past Surgical History:  Procedure Laterality Date  . ABDOMINAL AORTAGRAM  12/20/2012   Procedure: ABDOMINAL AORTAGRAM;  Surgeon: Runell Gess, MD;  Location: [facility] CATH LAB;  Service: Cardiovascular;;  . ABDOMINAL HYSTERECTOMY  2000  . ANGIOPLASTY / STENTING FEMORAL Right 12/25/2012  . ATHERECTOMY Right 12/25/2012   Procedure: ATHERECTOMY;  Surgeon: Runell Gess, MD;  Location: [facility] CATH LAB;  Service: Cardiovascular;  Laterality: Right;  right SFA  . COLONOSCOPY    . DIVERTING ILEOSTOMY N/A 08/03/2018   Procedure: DIVERTING LOOP ILEOSTOMY;  Surgeon: Romie Levee, MD;  Location: [facility] ORS;  Service: General;  Laterality: N/A;  . ESOPHAGOGASTRODUODENOSCOPY N/A 10/04/2018   Procedure: ESOPHAGOGASTRODUODENOSCOPY (EGD);  Surgeon: Jeani Hawking, MD;  Location: [facility] ENDOSCOPY;  Service: Endoscopy;  Laterality: N/A;  . EYE SURGERY Left    cataract  . HERNIA REPAIR    . IR THORACENTESIS ASP PLEURAL SPACE W/IMG GUIDE  07/02/2019  . IR THORACENTESIS ASP PLEURAL SPACE W/IMG GUIDE  07/03/2019  . LOWER EXTREMITY ANGIOGRAM N/A 12/20/2012   Procedure: LOWER EXTREMITY ANGIOGRAM;  Surgeon: Lorretta Harp, MD;  Location: [facility] CATH LAB;  Service: Cardiovascular;  Laterality: N/A;  . LOWER EXTREMITY ANGIOGRAM N/A 12/25/2012   Procedure: LOWER  EXTREMITY ANGIOGRAM;  Surgeon: Lorretta Harp, MD;  Location: [facility] CATH LAB;  Service: Cardiovascular;  Laterality: N/A;  . LOWER EXTREMITY INTERVENTION  05/08/2017  . LOWER EXTREMITY INTERVENTION Bilateral 05/08/2017   Procedure: LOWER EXTREMITY INTERVENTION;  Surgeon: Lorretta Harp, MD;  Location: [place] CV LAB;  Service: Cardiovascular;  Laterality: Bilateral;  . PERIPHERAL VASCULAR BALLOON ANGIOPLASTY Right 05/08/2017   Procedure: PERIPHERAL VASCULAR BALLOON ANGIOPLASTY;  Surgeon: Lorretta Harp, MD;  Location: [place] CV LAB;  Service: Cardiovascular;  Laterality: Right;  sfa  . PERIPHERAL VASCULAR INTERVENTION Right 05/08/2017   Procedure: PERIPHERAL VASCULAR INTERVENTION;  Surgeon: Lorretta Harp, MD;  Location: [place] CV LAB;  Service: Cardiovascular;  Laterality: Right;  ext iliac  . PORTACATH PLACEMENT Right 11/22/2017   Procedure: INSERTION PORT-A-CATH;  Surgeon: Leighton Ruff, MD;  Location: [facility] ORS;  Service: General;  Laterality: Right;  . SAVORY DILATION N/A 10/04/2018   Procedure: SAVORY DILATION;  Surgeon: Carol Ada, MD;  Location: [facility] ENDOSCOPY;  Service: Endoscopy;  Laterality: N/A;  . TOE SURGERY Right    2n toe   . UMBILICAL HERNIA REPAIR  2000  . UMBILICAL HERNIA REPAIR N/A 08/03/2018   Procedure: UMBILICAL HERNIA REPAIR;  Surgeon: Leighton Ruff, MD;  Location: [facility] ORS;  Service: General;  Laterality: N/A;  . XI ROBOTIC ASSISTED LOWER ANTERIOR RESECTION N/A 08/03/2018   Procedure: XI ROBOTIC ASSISTED LOWER ANTERIOR RESECTION; DIAGNOSTIC FLEXIBLE SIGMOIDOSCOPY; INTRAOPERATIVE ASSESSMENT OF VASCULAR PERFUSION;  Surgeon: Leighton Ruff, MD;  Location: [facility] ORS;  Service: General;  Laterality: N/A;     reports that she has quit smoking. Her smoking use included cigarettes. She has a 24.00 pack-year smoking history. She has never used smokeless tobacco. She reports that she does not drink alcohol and does not use drugs.  Allergies  Allergen Reactions  .  Kiwi Extract Itching and Other (See Comments)    Throat itching   . Lisinopril Other (See Comments)    Bruising   . Plavix [Clopidogrel] Other (See Comments)    "Made me feel badly- didn't agree with my system"    Family History  Problem Relation Age of Onset  . Hypertension Mother   . Stroke Mother   . Cancer Father        prostate  . Mental illness Sister   . Diabetes Brother   . Hypertension Brother   . Cancer Brother 95       prostate  . Diabetes Brother   . Hypertension Brother   . Cancer Brother        "tumors on neck and body"      Prior to Admission medications   Medication Sig Start Date End Date Taking? Authorizing Provider  albuterol (PROVENTIL HFA;VENTOLIN HFA) 108 (90 BASE) MCG/ACT inhaler Inhale 2 puffs into the lungs every 6 (six) hours as needed for wheezing or shortness of breath.    [provider]  apixaban (ELIQUIS) 5 MG TABS tablet Take 1 tablet (5 mg total) by mouth 2 (two)  times daily. 07/04/19   Eugenie Filler, MD  aspirin 81 MG tablet Take 81 mg by mouth daily.    [provider]  atorvastatin (LIPITOR) 10 MG tablet Take 1 tablet (10 mg total) by mouth daily. Patient taking differently: Take 10 mg by mouth at bedtime.  01/01/18   Minette Brine, FNP  Calcium Carbonate-Vitamin D (CALCIUM 600+D PO) Take 1 tablet by mouth daily.    [provider]  carvedilol (COREG) 3.125 MG tablet Take 1 tablet (3.125 mg total) by mouth 2 (two) times daily with a meal. 07/04/19   Eugenie Filler, MD  cetirizine (ZYRTEC) 10 MG tablet Take 10 mg by mouth daily.    [provider]  Cholecalciferol (VITAMIN D3) 50 MCG (2000 UT) TABS Take 4,000 Units by mouth daily.    [provider]  FERREX 150 150 MG capsule Take 150 mg by mouth 2 (two) times daily. 08/29/17   [provider]  gabapentin (NEURONTIN) 300 MG capsule Take 300 mg by mouth 2 (two) times daily.     [provider]  hydrALAZINE (APRESOLINE) 50 MG  tablet Take 1 tablet (50 mg total) by mouth every 6 (six) hours. 07/04/19   Eugenie Filler, MD  isosorbide mononitrate (IMDUR) 30 MG 24 hr tablet Take 1 tablet (30 mg total) by mouth daily. 07/05/19   Eugenie Filler, MD  levothyroxine (SYNTHROID) 50 MCG tablet Take 1 tablet (50 mcg total) by mouth daily. 07/04/19   Eugenie Filler, MD  Multiple Vitamins-Minerals (SENIOR MULTIVITAMIN PLUS PO) Take 1 tablet by mouth daily with breakfast.    [provider]  nitroGLYCERIN (NITROSTAT) 0.4 MG SL tablet Place 1 tablet (0.4 mg total) under the tongue every 5 (five) minutes as needed for chest pain. 07/04/19   Eugenie Filler, MD  pantoprazole (PROTONIX) 40 MG tablet Take 1 tablet (40 mg total) by mouth daily before breakfast. 07/05/19   Eugenie Filler, MD  Psyllium (METAMUCIL FIBER PO) Take 1 Scoop by mouth See admin instructions. Mix 1 scoopful into 6-8 ounces of desired beverage and drink two times a day    [provider]  sitaGLIPtin (JANUVIA) 25 MG tablet Take 1 tablet (25 mg total) by mouth daily. 04/04/19   Donne Hazel, MD  sodium bicarbonate 650 MG tablet Take 1 tablet (650 mg total) by mouth 3 (three) times daily. 07/04/19   Eugenie Filler, MD  tetrahydrozoline-zinc (VISINE-AC) 0.05-0.25 % ophthalmic solution Place 1 drop into both eyes 2 (two) times daily as needed (dry eyes).    [provider]  torsemide (DEMADEX) 20 MG tablet Take 4 tablets (80 mg total) by mouth 2 (two) times daily. 07/04/19   Eugenie Filler, MD  traMADol (ULTRAM) 50 MG tablet Take 1 tablet (50 mg total) by mouth every 12 (twelve) hours as needed for moderate pain. 04/04/19   Donne Hazel, MD  Triamcinolone Acetonide (NASACORT ALLERGY 24HR NA) Place 1 spray into both nostrils daily as needed (allergies).     [provider]  Trolamine Salicylate (ASPERCREME EX) Apply 1 application topically daily as needed (to affected areas, for joint pain).     [provider]   vitamin B-12 (CYANOCOBALAMIN) 500 MCG tablet Take 500 mcg by mouth daily.    [provider]    Physical Exam: Vitals:   02/25/20 1730 02/25/20 1733 02/25/20 1820 02/25/20 1830  BP: 111/65  109/63 103/66  Pulse:  82 79 80  Resp: 15  $'11 10 16  'M$ Temp:      TempSrc:      SpO2:  96% 99% 99%  Weight:      Height:        Constitutional: NAD, calm, comfortable Vitals:   02/25/20 1730 02/25/20 1733 02/25/20 1820 02/25/20 1830  BP: 111/65  109/63 103/66  Pulse:  82 79 80  Resp: $Remo'15 11 10 16  'buAJR$ Temp:      TempSrc:      SpO2:  96% 99% 99%  Weight:      Height:       Eyes: PERRL, lids and conjunctivae normal ENMT: Mucous membranes are dry. Posterior pharynx clear of any exudate or lesions.Normal dentition.  Neck: normal, supple, no masses, no thyromegaly Respiratory: clear to auscultation bilaterally, no wheezing, no crackles. Normal respiratory effort. No accessory muscle use.  Cardiovascular: Regular rate and rhythm, no murmurs / rubs / gallops. No extremity edema. 2+ pedal pulses. No carotid bruits.  Abdomen: no tenderness, no masses palpated. No hepatosplenomegaly. Bowel sounds positive.  Musculoskeletal: no clubbing / cyanosis. No joint deformity upper and lower extremities. Good ROM, no contractures. Normal muscle tone.  Skin: Deep ulcer on the right big toe, deep to the bone, and shallow ulcer on the left foot on the first ray.  Bilateral chronic lymphedema changes of the legs Neurologic: CN 2-12 grossly intact. Sensation intact, DTR normal. Strength 5/5 in all 4.  Psychiatric: Normal judgment and insight. Alert and oriented x 3. Normal mood.     Labs on Admission: I have personally reviewed following labs and imaging studies  CBC: Recent Labs  Lab 02/25/20 1717  WBC 9.2  NEUTROABS 8.5*  HGB 9.5*  HCT 28.1*  MCV 98.6  PLT 161*   Basic Metabolic Panel: Recent Labs  Lab 02/25/20 1717  NA 131*  K 3.2*  CL 94*  CO2 24  GLUCOSE 204*  BUN 67*  CREATININE  4.37*  CALCIUM 7.6*   GFR: Estimated Creatinine Clearance: 12 mL/min (A) (by C-G formula based on SCr of 4.37 mg/dL (H)). Liver Function Tests: Recent Labs  Lab 02/25/20 1717  AST 15  ALT 7  ALKPHOS 120  BILITOT 0.7  PROT 6.2*  ALBUMIN 1.8*   No results for input(s): LIPASE, AMYLASE in the last 168 hours. No results for input(s): AMMONIA in the last 168 hours. Coagulation Profile: No results for input(s): INR, PROTIME in the last 168 hours. Cardiac Enzymes: No results for input(s): CKTOTAL, CKMB, CKMBINDEX, TROPONINI in the last 168 hours. BNP (last 3 results) No results for input(s): PROBNP in the last 8760 hours. HbA1C: No results for input(s): HGBA1C in the last 72 hours. CBG: No results for input(s): GLUCAP in the last 168 hours. Lipid Profile: No results for input(s): CHOL, HDL, LDLCALC, TRIG, CHOLHDL, LDLDIRECT in the last 72 hours. Thyroid Function Tests: No results for input(s): TSH, T4TOTAL, FREET4, T3FREE, THYROIDAB in the last 72 hours. Anemia Panel: No results for input(s): VITAMINB12, FOLATE, FERRITIN, TIBC, IRON, RETICCTPCT in the last 72 hours. Urine analysis:    Component Value Date/Time   COLORURINE YELLOW 07/01/2019 1644   APPEARANCEUR CLOUDY (A) 07/01/2019 1644   LABSPEC 1.006 07/01/2019 1644   PHURINE 5.0 07/01/2019 1644   GLUCOSEU NEGATIVE 07/01/2019 1644   HGBUR MODERATE (A) 07/01/2019 1644   BILIRUBINUR NEGATIVE 07/01/2019 1644   BILIRUBINUR negative 11/27/2014 1052   KETONESUR NEGATIVE 07/01/2019 1644   PROTEINUR NEGATIVE 07/01/2019 1644   UROBILINOGEN 0.2 11/27/2014 1052   NITRITE POSITIVE (A)  07/01/2019 [place] (A) 07/01/2019 1644    Radiological Exams on Admission: DG Foot Complete Left  Result Date: 02/25/2020 CLINICAL DATA:  Bilateral foot wound EXAM: LEFT FOOT - COMPLETE 3+ VIEW COMPARISON:  None. FINDINGS: Bones appear diffusely demineralized. No subluxation. No soft tissue emphysema. Questionable small fracture  deformity at the base of the second proximal phalanx on one view. Possible bony resorptive change at the distal phalanx on the lateral view of what appears to be second digit. IMPRESSION: 1. Possible small fracture deformity at the base of the second proximal phalanx. Correlate for point tenderness. 2. Possible bony resorptive change/osteomyelitis at 1 of the distal phalanges on lateral view, probably second digit. Consider correlation with MRI if clinically able. 3. Diffuse bone demineralization. Electronically Signed   By: Donavan Foil M.D.   On: 02/25/2020 18:36   DG Foot Complete Right  Result Date: 02/25/2020 CLINICAL DATA:  Swelling with wounds EXAM: RIGHT FOOT COMPLETE - 3+ VIEW COMPARISON:  None. FINDINGS: Osseous demineralization. Bony destructive change in fragmentation at the first distal phalanx concerning for osteomyelitis. Foreshortened second digit possibly due to prior partial amputation at the level of the D IP joint, correlate with surgical history. No subluxation. No soft tissue emphysema. Ulcer at the tip of the first digit. Probable ulcer plantar surface of the foot at the level of the metatarsal heads and potential ulcer at the heel pad. IMPRESSION: 1. Bony demineralization with bony destructive change and fragmentation at the first distal phalanx concerning for osteomyelitis. Electronically Signed   By: Donavan Foil M.D.   On: 02/25/2020 18:46    EKG: Independently reviewed.  Chronic LBBB, prolonged QTC  Assessment/Plan Active Problems:   Osteomyelitis ([place])  (please populate well all problems here in Problem List. (For example, if patient is on BP meds at home and you resume or decide to hold them, it is a problem that needs to be her. Same for CAD, COPD, HLD and so on)  Acute, probably on chronic osteomyelitis -Definitely on the right big toe and also suspicious for left big toe and first ray. -Antibiotics vancomycin and cefepime -Prefer conservative management given  patient's comorbidity, but consulted podiatry for possible biopsy and deep tissue culture. -Send base level ESR -Wound care  AKI on CKD stage IV -Appears to be dehydrated, slow hydration x12 hours then reevaluate -Hold diuresis hydralazine and Coreg -Not a candidate for HD given her comorbidity and CODE STATUS.  Hyponatremia -Hypovolemia, hydration as above and reevaluate  IIDM with hyperglycemia -Continue Januvia, add sliding scale.  Chronic systolic CHF -Hypovolemia, mild hydration and reevaluate -Hold diuresis  DVT -Continue Eliquis  DVT prophylaxis: Eliquis  code Status: DNR Family Communication: Daughter at bedside Disposition Plan: Expect 2 midnight hospital stay, remained off osteomyelitis Consults called: Podiatry, wound care, palliative care Admission status: Telemetry admission  Lequita Halt MD [facility] Pager [phone]  02/25/2020, 7:35 PM

## 2020-02-26 ENCOUNTER — Inpatient Hospital Stay (HOSPITAL_COMMUNITY)

## 2020-02-26 DIAGNOSIS — I7025 Atherosclerosis of native arteries of other extremities with ulceration: Secondary | ICD-10-CM

## 2020-02-26 DIAGNOSIS — I5022 Chronic systolic (congestive) heart failure: Secondary | ICD-10-CM

## 2020-02-26 DIAGNOSIS — L97511 Non-pressure chronic ulcer of other part of right foot limited to breakdown of skin: Secondary | ICD-10-CM

## 2020-02-26 DIAGNOSIS — M86672 Other chronic osteomyelitis, left ankle and foot: Secondary | ICD-10-CM

## 2020-02-26 LAB — BASIC METABOLIC PANEL
Anion gap: 13 (ref 5–15)
BUN: 67 mg/dL — ABNORMAL HIGH (ref 8–23)
CO2: 24 mmol/L (ref 22–32)
Calcium: 7.3 mg/dL — ABNORMAL LOW (ref 8.9–10.3)
Chloride: 95 mmol/L — ABNORMAL LOW (ref 98–111)
Creatinine, Ser: 4.13 mg/dL — ABNORMAL HIGH (ref 0.44–1.00)
GFR, Estimated: 11 mL/min — ABNORMAL LOW (ref >60)
Glucose, Bld: 151 mg/dL — ABNORMAL HIGH (ref 70–99)
Potassium: 3.6 mmol/L (ref 3.5–5.1)
Sodium: 132 mmol/L — ABNORMAL LOW (ref 135–145)

## 2020-02-26 LAB — GLUCOSE, CAPILLARY
Glucose-Capillary: 152 mg/dL — ABNORMAL HIGH (ref 70–99)
Glucose-Capillary: 107 mg/dL — ABNORMAL HIGH (ref 70–99)
Glucose-Capillary: 164 mg/dL — ABNORMAL HIGH (ref 70–99)
Glucose-Capillary: 164 mg/dL — ABNORMAL HIGH (ref 70–99)

## 2020-02-26 LAB — CBC
HCT: 28.5 % — ABNORMAL LOW (ref 36.0–46.0)
Hemoglobin: 8.9 g/dL — ABNORMAL LOW (ref 12.0–15.0)
MCH: 31.8 pg (ref 26.0–34.0)
MCHC: 31.2 g/dL (ref 30.0–36.0)
MCV: 101.8 fL — ABNORMAL HIGH (ref 80.0–100.0)
Platelets: 435 10*3/uL — ABNORMAL HIGH (ref 150–400)
RBC: 2.8 MIL/uL — ABNORMAL LOW (ref 3.87–5.11)
RDW: 13.6 % (ref 11.5–15.5)
WBC: 8.1 10*3/uL (ref 4.0–10.5)
nRBC: 0 % (ref 0.0–0.2)

## 2020-02-26 LAB — SEDIMENTATION RATE: Sed Rate: 109 mm/hr — ABNORMAL HIGH (ref 0–22)

## 2020-02-26 MED ORDER — ADULT MULTIVITAMIN W/MINERALS CH
1.0000 | ORAL_TABLET | Freq: Every day | ORAL | Status: DC
  Administered 2020-02-26 – 2020-03-01 (×5): 1 via ORAL
  Filled 2020-02-26 (×5): qty 1

## 2020-02-26 NOTE — Consult Note (Signed)
WOC Nurse Consult Note: Patient receiving care in [facility] [phone]. Reason for Consult: wounds on feet Wound type: Patient has necrotic wounds on bilateral feet. On the right foot all 5 toes are impacted with necrotic ulcers, as well as the plantar surface of the first metatarsal head and entire heel.  The left toes 1 - 3 are discolored and swollen. The left heel is boggy and has a DTPI on the plantar surface.   The patient was on [place] prior to admission.  Complete resolution of these foot ulcers would likely require bilateral amputation.  I am not at all sure this is the avenue to pursue.  Therefore, I am ordering very conservative topical therapy while goals of care discussions can be had.    Pressure Injury POA: Yes Measurement: Wound bed: Drainage (amount, consistency, odor)  Periwound: Dressing procedure/placement/frequency:  Apply iodine from the swabsticks or swab pads from clean utility to all wounds on bilateral feet and toes.  Allow to air dry. Place feet into Prevalon boots. I have also added turning instructions. If aggressive measures are to be taken, consult surgery services for the feet.  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 Stomal assessment/size: 7/8 inches, red, moist Peristomal assessment: deferred, supplies have to be ordered, and the pouch was newly placed by the patient. Treatment options for stomal/peristomal skin: barrier ring if needed; patient does not currently use. Output: green Ostomy pouching: 1pc. Convex, Pouch Kellie Simmering [phone] Education provided: none Thank you for the consult.  Discussed plan of care with the patient.  [place] nurse will not follow at this time.  Please re-consult the [place] team if needed.  Val Riles, RN, MSN, CWOCN, CNS-BC, pager [phone]

## 2020-02-26 NOTE — Progress Notes (Signed)
   02/26/20 0827  Assess: MEWS Score  BP (!) 75/53  Assess: MEWS Score  MEWS Temp 0  MEWS Systolic 2  MEWS Pulse 0  MEWS RR 0  MEWS LOC 0  MEWS Score 2  MEWS Score Color Yellow  Assess: if the MEWS score is Yellow or Red  Were vital signs taken at a resting state? Yes  Focused Assessment No change from prior assessment  Early Detection of Sepsis Score *See Row Information* Medium  MEWS guidelines implemented *See Row Information* Yes  Treat  MEWS Interventions Escalated (See documentation below)  Pain Scale 0-10  Pain Score 0  Take Vital Signs  Increase Vital Sign Frequency  Yellow: Q 2hr X 2 then Q 4hr X 2, if remains yellow, continue Q 4hrs  Escalate  MEWS: Escalate Yellow: discuss with charge nurse/RN and consider discussing with provider and RRT  Notify: Provider  Provider Name/Title Dr Cordelia Poche  Date Provider Notified 02/26/20  Time Provider Notified 0820  Notification Type Page  Notification Reason Change in status  Response No new orders  Date of Provider Response 02/26/20  Time of Provider Response 0825  Notify: Rapid Response  Name of Rapid Response RN Notified N/A  Document  Patient Outcome Stabilized after interventions  Progress note created (see row info) Yes

## 2020-02-26 NOTE — Progress Notes (Signed)
Palliative Medicine RN Note: Consult order from Dr Lonny Prude noted. I spoke with Cheri with [facility], and they are aware of her admission. [name] will put in a note regarding the circumstances of her referral to ED, and PMT will see her when we have an available provider.  Marjie Skiff Asha Grumbine, RN, BSN, [facility] Palliative Medicine Team 02/26/2020 10:54 AM Office [phone]

## 2020-02-26 NOTE — Consult Note (Signed)
Nicole Sellers  JQB:[id number]  DOB: 03/17/1950  OA: 02/25/2020  Consulting physician: Lequita Halt, MD  Chief complaint: OM left great toe.  Heel ulcer RT. B/L foot ulcers  Brief narrative: 70 y.o. female PMHx type II DM, HTN, CKD stage IV, chronic PVD with chronic bilateral foot and leg wounds, DVT on Eliquis,chronic systolic CHF with LVEF 20 to 25%, stage III rectal cancer status post chemoradiation and resection, who has been under home hospice care and wound care presented with worsening of bilateral feet pain and infection.  Patient presented to the ED with worsening deterioration of the foot wounds and admitted podiatry consulted.  Patient seen this a.m. resting comfortably in bed.  Past Medical History:  Diagnosis Date  . Age-related nuclear cataract, right eye   . Allergy   . Anemia   . Arthritis    "joints sometimes" (12/25/2012)  . CHF (congestive heart failure) ([place])   . Chronic kidney disease    stage 4  . Clotting disorder ([place])   . Colon cancer ([place])   . Critical lower limb ischemia   . Deep vein thrombosis (DVT) ([place])   . Diverticulosis   . Gangrene of toe, Rt second toe 12/25/2012  . GERD (gastroesophageal reflux disease)   . High cholesterol   . Hypertension   . Hypothyroidism   . PAD (peripheral artery disease) ([place])   . PAD (peripheral artery disease) ([place])   . Peripheral neuropathy   . Rectal cancer ([place])   . S/P arterial stent, 12/25/13, successful diamondback orbital rotational arthrectomy, PTA using chocolate  balloon and stenting using I DEV stent of long segment calcified high-grade proximal and mid r 12/25/2012  . Tobacco abuse   . Type II diabetes mellitus (HCC)    CBC Latest Ref Rng & Units 02/26/2020 02/25/2020 07/04/2019  WBC 4.0 - 10.5 K/uL 8.1 9.2 -  Hemoglobin 12.0 - 15.0 g/dL 8.9(L) 9.5(L) 8.1(L)  Hematocrit 36.0 - 46.0 % 28.5(L) 28.1(L) 26.2(L)  Platelets 150 - 400 K/uL 435(H) 441(H) -   BMP Latest Ref Rng & Units 02/26/2020  02/25/2020 07/04/2019  Glucose 70 - 99 mg/dL 151(H) 204(H) 134(H)  BUN 8 - 23 mg/dL 67(H) 67(H) 24(H)  Creatinine 0.44 - 1.00 mg/dL 4.13(H) 4.37(H) 2.20(H)  BUN/Creat Ratio 12 - 28 - - -  Sodium 135 - 145 mmol/L 132(L) 131(L) 134(L)  Potassium 3.5 - 5.1 mmol/L 3.6 3.2(L) 3.6  Chloride 98 - 111 mmol/L 95(L) 94(L) 102  CO2 22 - 32 mmol/L 24 24 24   Calcium 8.9 - 10.3 mg/dL 7.3(L) 7.6(L) 8.4(L)   RIGHT FOOT:       LEFT FOOT:        Physical Exam: General: The patient is alert and NAD.   Dermatology: Please see photos above. RT foot: Dry stable ischemic ulcers noted to digits 1, 3, 4.  Dry  ischemic unstageable pressure ulcer with overlying well adhered eschar noted RT heel.   LT foot: Preulcerative stage I pressure ulcer left heel.  Ulcer is also noted to the left great toe and left anterior leg.  Overall there is no obvious drainage.  No significant malodor noted.  Ulcers appear very dry and stable.  Vascular: Known history of peripheral vascular disease  Neurological: Epicritic and protective threshold diminished bilaterally.   Musculoskeletal Exam: No significant pedal deformity.  History of prior partial toe amputation right second digit.  Radiographic Exam 02/25/2020 RT foot:  Osseous demineralization. Bony destructive change in fragmentation at the first  distal phalanx concerning for osteomyelitis. Foreshortened second digit possibly due to prior partial amputation at the level of the D IP joint, correlate with surgical history. No subluxation. No soft tissue emphysema. Ulcer at the tip of the first digit. Probable ulcer plantar surface of the foot at the level of the metatarsal heads and potential ulcer at the heel pad.  IMPRESSION: 1. Bony demineralization with bony destructive change and fragmentation at the first distal phalanx concerning for osteomyelitis.  Radiographic exam 02/25/2020 LT foot: Bones appear diffusely demineralized. No subluxation. No soft  tissue emphysema. Questionable small fracture deformity at the base of the second proximal phalanx on one view. Possible bony resorptive change at the distal phalanx on the lateral view of what appears to be second digit.  IMPRESSION: 1. Possible small fracture deformity at the base of the second proximal phalanx. Correlate for point tenderness. 2. Possible bony resorptive change/osteomyelitis at 1 of the distal phalanges on lateral view, probably second digit. Consider correlation with MRI if clinically able. 3. Diffuse bone demineralization.  Assessment/plan of care: 1. OM RT great toe/ischemic pressure ulcers bilateral feet  -Patient will likely need partial toe amputation right hallux and surgical debridement RT heel.   -Prior to any debridement patient needs full vascular work-up to optimize circulation.  She has not been followed by vascular for several years.   -Wounds are very dry and stable.  Currently no need for wound care orders or dressing changes.   -Order placed for offloading bunny boots bilateral  2.  Critical limb ischemia bilateral lower extremities/known history of PVD  -Recommend vascular consult  -Arterial ABI ordered B/L LE  -Will continue to follow  *Thank you for the consult.  Please contact me directly with any questions or concerns.  Cell [phone]     Edrick Kins, DPM [facility]  Dr. Edrick Kins, DPM    [address], [place] [address]                Office [phone]  Fax [phone]

## 2020-02-26 NOTE — Progress Notes (Signed)
ABI study completed.  Preliminary results relayed to Leatrice Jewels, [place].   See CV Proc for preliminary results report.   Darlin Coco, RDMS

## 2020-02-26 NOTE — Progress Notes (Signed)
PROGRESS NOTE    Nicole Sellers  DDU:[id number] DOB: September 29, 1950 DOA: 02/25/2020 PCP: Reynold Bowen, MD   Brief Narrative: Nicole Sellers is a 70 y.o. female with a history of diabetes mellitus, hypertension, CKD stage IV, PVD, bilateral feet/leg wounds, CHF. Patient presented secondary to failure to thrive and worsening LE wounds.   Assessment & Plan:   Active Problems:   Acute renal failure (HCC)   Osteomyelitis (HCC)   Osteomyelitis Noticed on foot x-ray. Podiatry consulted on admission with recommendations for ABI and vascular consult. Vancomycin and Cefepime initiated on admission. Unsure of patient's goals of care. Patient is under hospice care but is wanting treatment for leg wounds/infection. Does not want to consider amputation for treatment. -Continue Vancomycin/Cefepime -Goals of care discussions/Palliative care consult  AKI on CKD stage IV Baseline creatinine of 2.2. Creatinine of 4.37 on admission. In setting of poor oral intake and dehydration -Continue IV fluids; reduce rate  Hyponatremia Mild. Improved slightly   Diabetes mellitus, type 2 -Continue SSI  Hypothyroidism Patient is on Synthroid as an  Outpatient -Continue Synthroid  Chronic systolic heart failure No evidence of exacerbation. She does have significant LE edema but not likely from heart failure. Patient is is on Coreg, torsemide as an outpatient.  Primary hypertension Patient is on Coreg, hydralazine, Imdur as an outpatient.  PAD Patient follows with cardiology as an outpatient.  History of rectal cancer S/p neoadjuvant chemo and radiation. 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  History of DVT -Continue Eliquis   DVT prophylaxis: Eliquis Code Status:   Code Status: DNR Family Communication: None at bedside Disposition Plan: Discharge pending management of of LE wounds and goals of care   Consultants:   Podiatry  Procedures:    ABIs  Antimicrobials:  Vancomycin  Cefepime    Subjective: No dyspnea or chest pain.  Objective: Vitals:   02/26/20 0500 02/26/20 0659 02/26/20 0827 02/26/20 0828  BP: (!) 101/54 (!) 70/55 (!) 75/53 111/62  Pulse: 76   79  Resp: 14 16  12   Temp:  98.1 F (36.7 C)  98.1 F (36.7 C)  TempSrc:  Oral  Oral  SpO2: 97% 97%  99%  Weight:      Height:        Intake/Output Summary (Last 24 hours) at 02/26/2020 0914 Last data filed at 02/26/2020 0700 Gross per 24 hour  Intake -  Output 275 ml  Net -275 ml   Filed Weights   02/25/20 1700  Weight: 74.8 kg    Examination:  General exam: Appears calm and comfortable Respiratory system: Clear to auscultation. Respiratory effort normal. Cardiovascular system: S1 & S2 heard, RRR. No murmurs, rubs, gallops or clicks. Gastrointestinal system: Abdomen is nondistended, soft and nontender. No organomegaly or masses felt. Normal bowel sounds heard. Central nervous system: Alert and oriented. No focal neurological deficits. Musculoskeletal: Bilateral LE are edematous with multiple wounds. Multiple ischemic appearing LE digits. Skin: No cyanosis. No rashes Psychiatry: Judgement and insight appear normal. Mood & affect appropriate.     Data Reviewed: I have personally reviewed following labs and imaging studies  CBC Lab Results  Component Value Date   WBC 8.1 02/26/2020   RBC 2.80 (L) 02/26/2020   HGB 8.9 (L) 02/26/2020   HCT 28.5 (L) 02/26/2020   MCV 101.8 (H) 02/26/2020   MCH 31.8 02/26/2020   PLT 435 (H) 02/26/2020   MCHC 31.2 02/26/2020   RDW 13.6 02/26/2020   LYMPHSABS 0.4 (L) 02/25/2020  MONOABS 0.4 02/25/2020   EOSABS 0.0 02/25/2020   BASOSABS 0.0 26/71/2458     Last metabolic panel Lab Results  Component Value Date   NA 132 (L) 02/26/2020   K 3.6 02/26/2020   CL 95 (L) 02/26/2020   CO2 24 02/26/2020   BUN 67 (H) 02/26/2020   CREATININE 4.13 (H) 02/26/2020   GLUCOSE 151 (H) 02/26/2020   GFRNONAA 11  (L) 02/26/2020   GFRAA 25 (L) 07/04/2019   CALCIUM 7.3 (L) 02/26/2020   PHOS 3.9 04/03/2019   PROT 6.2 (L) 02/25/2020   ALBUMIN 1.8 (L) 02/25/2020   LABGLOB 2.9 08/10/2017   AGRATIO 1.4 08/10/2017   BILITOT 0.7 02/25/2020   ALKPHOS 120 02/25/2020   AST 15 02/25/2020   ALT 7 02/25/2020   ANIONGAP 13 02/26/2020    CBG (last 3)  Recent Labs    02/25/20 2135 02/26/20 0739  GLUCAP 175* 152*     GFR: Estimated Creatinine Clearance: 12.7 mL/min (A) (by C-G formula based on SCr of 4.13 mg/dL (H)).  Coagulation Profile: No results for input(s): INR, PROTIME in the last 168 hours.  Recent Results (from the past 240 hour(s))  SARS CORONAVIRUS 2 (TAT 6-24 HRS) Nasopharyngeal Nasopharyngeal Swab     Status: None   Collection Time: 02/25/20  5:07 PM   Specimen: Nasopharyngeal Swab  Result Value Ref Range Status   SARS Coronavirus 2 NEGATIVE NEGATIVE Final    Comment: (NOTE) SARS-CoV-2 target nucleic acids are NOT DETECTED.  The SARS-CoV-2 RNA is generally detectable in upper and lower respiratory specimens during the acute phase of infection. Negative results do not preclude SARS-CoV-2 infection, do not rule out co-infections with other pathogens, and should not be used as the sole basis for treatment or other patient management decisions. Negative results must be combined with clinical observations, patient history, and epidemiological information. The expected result is Negative.  Fact Sheet for Patients: SugarRoll.be  Fact Sheet for Healthcare Providers: https://www.woods-mathews.com/  This test is not yet approved or cleared by the [place] FDA and  has been authorized for detection and/or diagnosis of SARS-CoV-2 by FDA under an Emergency Use Authorization (EUA). This EUA will remain  in effect (meaning this test can be used) for the duration of the COVID-19 declaration under Se ction 564(b)(1) of the Act, 21 U.S.C. section  360bbb-3(b)(1), unless the authorization is terminated or revoked sooner.  Performed at [facility], [place] [address], [place], [place] [address]         Radiology Studies: DG Foot Complete Left  Result Date: 02/25/2020 CLINICAL DATA:  Bilateral foot wound EXAM: LEFT FOOT - COMPLETE 3+ VIEW COMPARISON:  None. FINDINGS: Bones appear diffusely demineralized. No subluxation. No soft tissue emphysema. Questionable small fracture deformity at the base of the second proximal phalanx on one view. Possible bony resorptive change at the distal phalanx on the lateral view of what appears to be second digit. IMPRESSION: 1. Possible small fracture deformity at the base of the second proximal phalanx. Correlate for point tenderness. 2. Possible bony resorptive change/osteomyelitis at 1 of the distal phalanges on lateral view, probably second digit. Consider correlation with MRI if clinically able. 3. Diffuse bone demineralization. Electronically Signed   By: Donavan Foil M.D.   On: 02/25/2020 18:36   DG Foot Complete Right  Result Date: 02/25/2020 CLINICAL DATA:  Swelling with wounds EXAM: RIGHT FOOT COMPLETE - 3+ VIEW COMPARISON:  None. FINDINGS: Osseous demineralization. Bony destructive change in fragmentation at the first distal  phalanx concerning for osteomyelitis. Foreshortened second digit possibly due to prior partial amputation at the level of the D IP joint, correlate with surgical history. No subluxation. No soft tissue emphysema. Ulcer at the tip of the first digit. Probable ulcer plantar surface of the foot at the level of the metatarsal heads and potential ulcer at the heel pad. IMPRESSION: 1. Bony demineralization with bony destructive change and fragmentation at the first distal phalanx concerning for osteomyelitis. Electronically Signed   By: Donavan Foil M.D.   On: 02/25/2020 18:46        Scheduled Meds: . apixaban  5 mg Oral BID  . aspirin EC  81 mg Oral Daily  . atorvastatin   10 mg Oral QHS  . dronabinol  2.5 mg Oral BID AC  . feeding supplement  237 mL Oral BID BM  . gabapentin  300 mg Oral BID  . insulin aspart  0-9 Units Subcutaneous TID WC  . isosorbide mononitrate  30 mg Oral Daily  . levothyroxine  50 mcg Oral Daily  . loratadine  10 mg Oral Daily  . pantoprazole  40 mg Oral QAC breakfast  . sodium bicarbonate  650 mg Oral TID  . vancomycin variable dose per unstable renal function (pharmacist dosing)   Does not apply See admin instructions   Continuous Infusions: . sodium chloride 100 mL/hr at 02/25/20 2123  . ceFEPime (MAXIPIME) IV Stopped (02/25/20 2200)     LOS: 1 day     Cordelia Poche, MD [facility] 02/26/2020, 9:14 AM  If 7PM-7AM, please contact night-coverage [email or web address]

## 2020-02-26 NOTE — TOC Initial Note (Signed)
Transition of Care [facility]) - Initial/Assessment Note    Patient Details  Name: Nicole Sellers MRN: [id number] Date of Birth: 1950-01-12  [facility] CM/SW Contact:    Emeterio Reeve, [place] Phone Number: 02/26/2020, 4:16 PM  Clinical Narrative:                  CSW met with pt at bedside. CSW introduced self and explained her role at the hospital.  Pt reports PTA she was living at home alone. Pts son and granddaughter check on her on occasion.  Pt receives hospice and aid services. Pt reports up until last week she was mostly independent with mobility and required some assistance will ADL's.   CSW attempted to discuss DC plan with pt. Pt states she needs help, she needs to go to a faciity. Pt reports her family cannot provide 24 hour support. CSW explained that If pt goes to SNF she will need to revoke her hospice. If pt goes long term at a nursing facility with hospice she will need to apply for medicaid to cover that cost. Pt asked why a few times and CSW was unable to to break it down in a way for pt to understand. Pt stated CSW can call her son [name] and talk to him about it.   Expected Discharge Plan: Skilled Nursing Facility Barriers to Discharge: Continued Medical Work up,Insurance Authorization   Patient Goals and CMS Choice Patient states their goals for this hospitalization and ongoing recovery are:: Pt stated she wasnt sure CMS Medicare.gov Compare Post Acute Care list provided to:: Patient Choice offered to / list presented to : [place]  Expected Discharge Plan and Services Expected Discharge Plan: [place]       Living arrangements for the past 2 months: Single Family Home                                      Prior Living Arrangements/Services Living arrangements for the past 2 months: Single Family Home Lives with:: Self Patient language and need for interpreter reviewed:: Yes Do you feel safe going back to the  place where you live?: Yes      Need for Family Participation in Patient Care: Yes (Comment) Care giver support system in place?: Yes (comment) Current home services: DME,Homehealth aide,Hospice Criminal Activity/Legal Involvement Pertinent to Current Situation/Hospitalization: No - Comment as needed  Activities of Daily Living      Permission Sought/Granted Permission sought to share information with : Family Supports Permission granted to share information with : Yes, Verbal Permission Granted     Permission granted to share info w AGENCY: SNF        Emotional Assessment Appearance:: Appears stated age Attitude/Demeanor/Rapport: Engaged Affect (typically observed): Appropriate Orientation: : Oriented to Situation,Oriented to  Time,Oriented to Place,Oriented to Self Alcohol / Substance Use: Not Applicable Psych Involvement: No (comment)  Admission diagnosis:  Osteomyelitis ([place]) [M86.9] Acute renal failure, unspecified acute renal failure type ([place]) [N17.9] Osteomyelitis of foot, unspecified laterality, unspecified type ([place]) [M86.9] Patient Active Problem List   Diagnosis Date Noted  . Osteomyelitis ([place]) 02/25/2020  . Acute on chronic respiratory failure with hypoxia ([place]) 07/03/2019  . Pleural effusion on left   . Hypothyroidism   . DNR (do not resuscitate)   . Malnutrition of moderate degree 06/28/2019  . CHF (congestive heart failure) ([place]) 06/27/2019  . Respiratory distress   .  Stage 5 chronic kidney disease not on chronic dialysis ([place])   . Palliative care by specialist   . Goals of care, counseling/discussion   . General weakness   . CKD (chronic kidney disease) stage 4, GFR 15-29 ml/min (HCC) 05/21/2019  . Fluid overload 05/21/2019  . Acute congestive heart failure ([name]) 05/21/2019  . Anemia of chronic disease 05/21/2019  . Transaminitis 05/21/2019  . Acute renal failure ([place]) 03/29/2019  . DVT (deep venous thrombosis) ([place]) 03/29/2019  . Oral candidiasis    . AKI (acute kidney injury) ([place]) 10/01/2018  . Hypokalemia 10/01/2018  . Pyuria 10/01/2018  . Odynophagia 10/01/2018  . Hyponatremia 10/01/2018  . Right sided weakness 10/01/2018  . SBO (small bowel obstruction) ([place]) 08/22/2018  . Dehydration 08/21/2018  . Pre-operative cardiovascular examination 06/07/2018  . Rectal cancer ([place]) 11/06/2017  . Claudication in peripheral vascular disease ([place]) 05/08/2017  . Carotid artery disease ([place]) 04/14/2017  . PAD (peripheral artery disease) Rt ABI 0.7, Lt ABI 0.46 12/25/2012  . S/P arterial stent, 12/25/13, successful diamondback orbital rotational arthrectomy, PTA using chocolate  balloon and stenting using I DEV stent of long segment calcified high-grade proximal and mid r 12/25/2012  . Gangrene of toe, Rt second toe 12/25/2012  . Essential hypertension 12/13/2012  . Non-insulin treated type 2 diabetes mellitus ([place]) 12/13/2012  . Critical lower limb ischemia ([place]) 12/13/2012  . Tobacco abuse 12/13/2012   PCP:  Reynold Bowen, MD Pharmacy:   [id number] - [place], [place] [address] [place] [address] Phone: [phone] Fax: [phone]     Social Determinants of Health (SDOH) Interventions    Readmission Risk Interventions Readmission Risk Prevention Plan 06/28/2019 05/22/2019 05/22/2019  Transportation Screening - - Complete  Medication Review ([place]) Complete - Complete  PCP or Specialist appointment within 3-5 days of discharge - - Complete  PCP/Specialist Appt Not Complete comments - - -  [place] or [place] - - Complete  SW Recovery Care/Counseling Consult - - Complete  SW Consult Not Complete Comments - - -  [place] - Complete Not [place] - - Not Applicable  Some recent data might be hidden   Emeterio Reeve, Latanya Presser, [place] Social Worker [phone]

## 2020-02-26 NOTE — Progress Notes (Addendum)
Initial Nutrition Assessment  DOCUMENTATION CODES:   Not applicable  INTERVENTION:   -Continue Ensure Enlive po BID, each supplement provides 350 kcal and 20 grams of protein -Magic cup BID with meals, each supplement provides 290 kcal and 9 grams of protein -MVI with minerals daily  NUTRITION DIAGNOSIS:   Increased nutrient needs related to wound healing as evidenced by estimated needs.  GOAL:   Patient will meet greater than or equal to 90% of their needs  MONITOR:   PO intake,Supplement acceptance,Labs,Weight trends,Skin,I & O's  REASON FOR ASSESSMENT:   Malnutrition Screening Tool    ASSESSMENT:   Nicole Sellers is a 70 y.o. female with medical history significant of IIDM, HTN, CKD stage IV, chronic PVD with chronic bilateral feet and legs wound, DVT on Eliquis, chronic systolic CHF with LVEF 20 to 25%, stage III rectal cancer status post chemoradiation and resection, who has been under home hospice care and wound care presented with worsening of bilateral feet pain and infection.  Pt admitted with osteomyelitis.   Reviewed I/O's: -275 ml x 24 hours  Pt out of room at time of visit. No family or caregivers present to provide further history. Per palliative care notes, pt is active with hospice.   Per [facility] notes, pt with necrotic wounds on bilateral feet. Right foot also has 5 necrotic toes as well as necrosis on plantar surface of first metatarsal head and entire heel. Pt also with DPTO on lt plantar foot surface.   Per [facility] notes, pt will likely require partial toe amputation of rt hallus and debridement of rt heel.   Reviewed wt hx; wt has been stable over the past year. However, pt with moderate edema, which may be masking weight loss as well as fat and muscle depletion.   Pt with increased nutritional needs for wound healing and would benefit from addition of oral nutrition supplements.   Medications reviewed and include marinol.   Lab Results  Component  Value Date   HGBA1C 5.8 (H) 06/28/2019   PTA DM medications are none.   Labs reviewed: Na: 132, CBGS: 152-175 (inpatient orders for glycemic control are 0-9 units insulin aspart TID with meals).   Diet Order:   Diet Order            Diet renal/carb modified with fluid restriction Diet-HS Snack? Nothing; Fluid restriction: 1200 mL Fluid; Room service appropriate? Yes; Fluid consistency: Thin  Diet effective now                 EDUCATION NEEDS:   No education needs have been identified at this time  Skin:  Skin Assessment: Reviewed RN Assessment  Last BM:  02/26/20  Height:   Ht Readings from Last 1 Encounters:  02/25/20 5\' 6"  (1.676 m)    Weight:   Wt Readings from Last 1 Encounters:  02/25/20 74.8 kg    Ideal Body Weight:  59.1 kg  BMI:  Body mass index is 26.63 kg/m.  Estimated Nutritional Needs:   Kcal:  2050-2250  Protein:  115-130 grams  Fluid:  1.2 L    Loistine Chance, RD, LDN, [place] Registered Dietitian II Certified Diabetes Care and Education Specialist Please refer to [facility] for RD and/or RD on-call/weekend/after hours pager

## 2020-02-26 NOTE — ED Notes (Signed)
Tele Breakfast order placed 

## 2020-02-26 NOTE — Progress Notes (Signed)
Pt is a current home care pt with [facility]. She resides at home by herself. 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   [facility] has been for several weeks trying to convince pt to purse placement due to ongoing decline and inability to care for her self. Which she has been consistently refusing this. Our NP with Hospice has seen pt multiple times and does not feel she has capacity to make decisions so [facility] has made 3 APS referral in past 6 weeks and all three times it was closed out with them feeling pt had capacity and able to make her own decisions. The son Harrell Gave has been hand off with any attempts to reach him and help with the hospice plan of care.   Our SW has been working with DSS and was able to get nurse aides set up for a few hours a days several times a week to help pt but as of 02/21/20 the [place], Ms. Mathis Fare stated they would not be able to start this until 2nd week of March.  The pt was sent to the ED yesterday after the RN and MSW made a visit to the home and had to stand outside of pt's door talking through the door for approximately 15 minutes, before pt finally stated that she could not come to the door bc she cannot walk. Upon entering, there was a strong odor in apartment and pt was cleaning herself off, and it appeared her colostomy bag had leaked all over her because she had not changed it in several days. She was still wearing the clothes she changed into on Friday 2/11, and was cleaning stomach contents and feces off of herself and her skin. She admitted to hospice staff that she had not been able to stand to get off of the couch since Friday when the RN was last there. As she was cleaned by the two it was noted that she could not bear weight on her foot and stand to get her cleaned off without  assist of two people.  Pt's right leg noted to be more edematous than it normally is, and there is redness and warmth going up her leg from her foot. RN went to proceed with wound care, and pt's legs are unwrapped, and the dressing on her right heel is not present. When RN removed sock, the heel wound is twice it's size in diameter, and there is necrotic tissue and undermining present. Pt cannot walk, and cannot care for her wounds, cannot remember to take her medications, and at this point, she cannot get off the couch to get water or food. She was not felt to be in safe environment and she did finally agree to both our RN and SW that she needs help and can not take care of herself anymore. The pt refused to go to our inpt hospice facility therefore sent to ED. We do not feel that a safe d/c plan is back at the home unless she has 24 hour care.   I have spoke to our MD regarding this hospitalization it will be a GIP admission and related to her terminal illness.   We will continue to follow pt through out hospitalization and will be able to assist at an ALF or SNF if this is the d/c plan. However, the pt would  need to revoke services if she wants to pursue surgical intervention for her wounds, long term IV antibiotics for infections or an aggressive vascular workup.    I have spoke to [facility] in [facility] to update on all of this as well.   I have reached out to the son and updated him on this as well.   Please reach out to me with any concerns or questions. Webb Silversmith RN BSN [facility] [phone]

## 2020-02-27 LAB — URINALYSIS, ROUTINE W REFLEX MICROSCOPIC
Bilirubin Urine: NEGATIVE
Glucose, UA: NEGATIVE mg/dL
Hgb urine dipstick: NEGATIVE
Ketones, ur: NEGATIVE mg/dL
Leukocytes,Ua: NEGATIVE
Nitrite: NEGATIVE
Protein, ur: NEGATIVE mg/dL
Specific Gravity, Urine: 1.011 (ref 1.005–1.030)
pH: 5 (ref 5.0–8.0)

## 2020-02-27 LAB — BASIC METABOLIC PANEL
Anion gap: 10 (ref 5–15)
BUN: 67 mg/dL — ABNORMAL HIGH (ref 8–23)
CO2: 24 mmol/L (ref 22–32)
Calcium: 7.3 mg/dL — ABNORMAL LOW (ref 8.9–10.3)
Chloride: 100 mmol/L (ref 98–111)
Creatinine, Ser: 3.66 mg/dL — ABNORMAL HIGH (ref 0.44–1.00)
GFR, Estimated: 13 mL/min — ABNORMAL LOW (ref >60)
Glucose, Bld: 112 mg/dL — ABNORMAL HIGH (ref 70–99)
Potassium: 3.5 mmol/L (ref 3.5–5.1)
Sodium: 134 mmol/L — ABNORMAL LOW (ref 135–145)

## 2020-02-27 LAB — VANCOMYCIN, RANDOM: Vancomycin Rm: 16

## 2020-02-27 LAB — CBC
HCT: 26.4 % — ABNORMAL LOW (ref 36.0–46.0)
Hemoglobin: 8.3 g/dL — ABNORMAL LOW (ref 12.0–15.0)
MCH: 31.7 pg (ref 26.0–34.0)
MCHC: 31.4 g/dL (ref 30.0–36.0)
MCV: 100.8 fL — ABNORMAL HIGH (ref 80.0–100.0)
Platelets: 457 10*3/uL — ABNORMAL HIGH (ref 150–400)
RBC: 2.62 MIL/uL — ABNORMAL LOW (ref 3.87–5.11)
RDW: 13.6 % (ref 11.5–15.5)
WBC: 7.4 10*3/uL (ref 4.0–10.5)
nRBC: 0 % (ref 0.0–0.2)

## 2020-02-27 LAB — APTT: aPTT: 136 seconds — ABNORMAL HIGH (ref 24–36)

## 2020-02-27 LAB — GLUCOSE, CAPILLARY
Glucose-Capillary: 114 mg/dL — ABNORMAL HIGH (ref 70–99)
Glucose-Capillary: 118 mg/dL — ABNORMAL HIGH (ref 70–99)
Glucose-Capillary: 98 mg/dL (ref 70–99)
Glucose-Capillary: 85 mg/dL (ref 70–99)

## 2020-02-27 LAB — HEMOGLOBIN A1C
Hgb A1c MFr Bld: 6 % — ABNORMAL HIGH (ref 4.8–5.6)
Mean Plasma Glucose: 126 mg/dL

## 2020-02-27 LAB — HEPARIN LEVEL (UNFRACTIONATED): Heparin Unfractionated: >2.2 IU/mL — ABNORMAL HIGH (ref 0.30–0.70)

## 2020-02-27 MED ORDER — SODIUM CHLORIDE 0.9% FLUSH: 10.0000 mL | INTRAVENOUS | Status: DC | PRN

## 2020-02-27 MED ORDER — HYDROMORPHONE HCL 1 MG/ML IJ SOLN
0.5000 mg | INTRAMUSCULAR | Status: DC | PRN
  Administered 2020-02-28: 1 mg via INTRAVENOUS
  Administered 2020-02-29: 0.5 mg via INTRAVENOUS
  Administered 2020-03-02: 1 mg via INTRAVENOUS
  Filled 2020-02-27 (×4): qty 1

## 2020-02-27 MED ORDER — HEPARIN (PORCINE) 25000 UT/250ML-% IV SOLN
800.0000 [IU]/h | INTRAVENOUS | Status: DC
  Administered 2020-02-27: 1000 [IU]/h via INTRAVENOUS
  Administered 2020-02-29 – 2020-03-02 (×2): 800 [IU]/h via INTRAVENOUS
  Filled 2020-02-27 (×6): qty 250

## 2020-02-27 MED ORDER — OXYCODONE-ACETAMINOPHEN 5-325 MG PO TABS
1.0000 | ORAL_TABLET | Freq: Four times a day (QID) | ORAL | Status: DC | PRN
  Administered 2020-02-27 – 2020-02-29 (×2): 1 via ORAL
  Filled 2020-02-27 (×3): qty 1

## 2020-02-27 MED ORDER — CHLORHEXIDINE GLUCONATE CLOTH 2 % EX PADS
6.0000 | MEDICATED_PAD | Freq: Every day | CUTANEOUS | Status: DC
  Administered 2020-02-27 – 2020-03-01 (×4): 6 via TOPICAL

## 2020-02-27 MED ORDER — VANCOMYCIN HCL 750 MG/150ML IV SOLN
750.0000 mg | INTRAVENOUS | Status: DC
  Administered 2020-02-27 – 2020-02-29 (×2): 750 mg via INTRAVENOUS
  Filled 2020-02-27 (×3): qty 150

## 2020-02-27 NOTE — Progress Notes (Signed)
ANTICOAGULATION CONSULT NOTE - Follow Up Consult  Pharmacy Consult for Heparin Indication: history of DVT  Allergies  Allergen Reactions  . Kiwi Extract Itching and Other (See Comments)    Throat itching   . Lisinopril Other (See Comments)    Bruising   . Plavix [Clopidogrel] Other (See Comments)    "Made me feel badly- didn't agree with my system"    Patient Measurements: Height: 5\' 6"  (167.6 cm) Weight: 74.8 kg (165 lb) IBW/kg (Calculated) : 59.3 Heparin Dosing Weight: 74 kg  Vital Signs: Temp: 97.9 F (36.6 C) (02/17 1950) Temp Source: Oral (02/17 1950) BP: 102/56 (02/17 1950) Pulse Rate: 73 (02/17 1950)  Labs: Recent Labs    02/25/20 1717 02/26/20 0431 02/27/20 0925 02/27/20 2100  HGB 9.5* 8.9* 8.3*  --   HCT 28.1* 28.5* 26.4*  --   PLT 441* 435* 457*  --   APTT  --   --   --  136*  HEPARINUNFRC  --   --   --  >2.20*  CREATININE 4.37* 4.13* 3.66*  --     Estimated Creatinine Clearance: 14.4 mL/min (A) (by C-G formula based on SCr of 3.66 mg/dL (H)).  Assessment: 52 YOF presented with fotd pain, found to have critical limb ischemia with osteomyelitis of the toes and heel wound.   Patient has a history of DVT and on Eliquis PTA.  Pharmacy consulted to transition to IV heparin while Eliquis on hold, last dose on 02/26/20 PM.  Will use aPTT to guide heparin dosing until heparin level and aPTT correlate.     Initial heparin level is >2.20 (invalid) and aPTT 136 seconds is above target on heparin at 1000 units/hr. No bleeding or infusion problems reported.  Goal of Therapy:  Heparin level 0.3-0.7 units/ml aPTT 66-102 seconds Monitor platelets by anticoagulation protocol: Yes   Plan:  Decrease heparin drip to 800 units/hr Next heparin level and aPTT in am Daily heparin level, aPTT and CBC. Eliquis on hold.  Arty Baumgartner, RPh 02/27/2020,10:30 PM

## 2020-02-27 NOTE — Progress Notes (Signed)
PROGRESS NOTE    Nicole Sellers  DGL:[id number] DOB: 1950/12/25 DOA: 02/25/2020 PCP: Reynold Bowen, MD   Brief Narrative: Nicole Sellers is a 70 y.o. female with a history of diabetes mellitus, hypertension, CKD stage IV, PVD, bilateral feet/leg wounds, CHF. Patient presented secondary to failure to thrive and worsening LE wounds.   Assessment & Plan:   Principal Problem:   Osteomyelitis ([place]) Active Problems:   Essential hypertension   Non-insulin treated type 2 diabetes mellitus (HCC)   PAD (peripheral artery disease) Rt ABI 0.7, Lt ABI 0.46   AKI (acute kidney injury) (HCC)   Acute renal failure (HCC)   CKD (chronic kidney disease) stage 4, GFR 15-29 ml/min (HCC)   Chronic systolic CHF (congestive heart failure) (HCC)   Osteomyelitis Leg wounds/Cellulitis Noticed on foot x-ray. Podiatry consulted on admission with recommendations for ABI and vascular consult. Vancomycin and Cefepime initiated on admission. Unsure of patient's goals of care. Patient is under hospice care but is wanting treatment for leg wounds/infection. Does not want to consider amputation for treatment. -Continue Vancomycin/Cefepime -Goals of care discussions/Palliative care consult -Vascular surgery consult  AKI on CKD stage IV Baseline creatinine of 2.2. Creatinine of 4.37 on admission. In setting of poor oral intake and dehydration -Continue IV fluids at 50 ml/hr -Follow-up BMP for this morning  Hyponatremia Mild. Improved slightly   Diabetes mellitus, type 2 -Continue SSI  Hypothyroidism Patient is on Synthroid as an  Outpatient -Continue Synthroid  Chronic systolic heart failure No evidence of exacerbation. She does have significant LE edema but not likely from heart failure. Patient is is on Coreg, torsemide as an outpatient.  Primary hypertension Patient is on Coreg, hydralazine, Imdur as an outpatient.  PAD Patient follows with cardiology as an outpatient.  History of rectal  cancer S/p neoadjuvant chemo and radiation. 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  History of DVT Discontinue Eliquis and start heparin IV   DVT prophylaxis: Heparin IV Code Status:   Code Status: DNR Family Communication: Son on telephone Disposition Plan: Discharge pending management of of LE wounds and goals of care   Consultants:   Podiatry  Palliative care medicine  Vascular surgery  Procedures:   ABIs  Antimicrobials:  Vancomycin  Cefepime    Subjective: Leg pain today. No other concerns.  Objective: Vitals:   02/26/20 1056 02/26/20 1452 02/26/20 1818 02/27/20 0347  BP: (!) 100/56 106/66 116/68 (!) 107/52  Pulse: 68 82 80 72  Resp: 20 19 18 18   Temp: 97.7 F (36.5 C)  98.5 F (36.9 C) 98 F (36.7 C)  TempSrc: Oral Oral Tympanic   SpO2: 95% 96% 98% 98%  Weight:      Height:        Intake/Output Summary (Last 24 hours) at 02/27/2020 0915 Last data filed at 02/26/2020 1816 Gross per 24 hour  Intake 2184.76 ml  Output 450 ml  Net 1734.76 ml   Filed Weights   02/25/20 1700  Weight: 74.8 kg    Examination:  General exam: Appears calm and comfortable  Respiratory system: Clear to auscultation. Respiratory effort normal. Cardiovascular system: S1 & S2 heard, RRR. No murmurs, rubs, gallops or clicks. Gastrointestinal system: Abdomen is nondistended, soft and nontender. No organomegaly or masses felt. Normal bowel sounds heard. Central nervous system: Alert and oriented. No focal neurological deficits. Musculoskeletal: LE edema. No calf tenderness Skin: No cyanosis. Multiple foot/leg wounds with ulcer formation which are dry Psychiatry: Judgement and insight appear normal. Mood &  affect appropriate.     Data Reviewed: I have personally reviewed following labs and imaging studies  CBC Lab Results  Component Value Date   WBC 8.1 02/26/2020   RBC 2.80 (L) 02/26/2020   HGB 8.9 (L) 02/26/2020   HCT 28.5 (L) 02/26/2020    MCV 101.8 (H) 02/26/2020   MCH 31.8 02/26/2020   PLT 435 (H) 02/26/2020   MCHC 31.2 02/26/2020   RDW 13.6 02/26/2020   LYMPHSABS 0.4 (L) 02/25/2020   MONOABS 0.4 02/25/2020   EOSABS 0.0 02/25/2020   BASOSABS 0.0 23/55/7322     Last metabolic panel Lab Results  Component Value Date   NA 132 (L) 02/26/2020   K 3.6 02/26/2020   CL 95 (L) 02/26/2020   CO2 24 02/26/2020   BUN 67 (H) 02/26/2020   CREATININE 4.13 (H) 02/26/2020   GLUCOSE 151 (H) 02/26/2020   GFRNONAA 11 (L) 02/26/2020   GFRAA 25 (L) 07/04/2019   CALCIUM 7.3 (L) 02/26/2020   PHOS 3.9 04/03/2019   PROT 6.2 (L) 02/25/2020   ALBUMIN 1.8 (L) 02/25/2020   LABGLOB 2.9 08/10/2017   AGRATIO 1.4 08/10/2017   BILITOT 0.7 02/25/2020   ALKPHOS 120 02/25/2020   AST 15 02/25/2020   ALT 7 02/25/2020   ANIONGAP 13 02/26/2020    CBG (last 3)  Recent Labs    02/26/20 1638 02/26/20 2127 02/27/20 0722  GLUCAP 164* 164* 114*     GFR: Estimated Creatinine Clearance: 12.7 mL/min (A) (by C-G formula based on SCr of 4.13 mg/dL (H)).  Coagulation Profile: No results for input(s): INR, PROTIME in the last 168 hours.  Recent Results (from the past 240 hour(s))  SARS CORONAVIRUS 2 (TAT 6-24 HRS) Nasopharyngeal Nasopharyngeal Swab     Status: None   Collection Time: 02/25/20  5:07 PM   Specimen: Nasopharyngeal Swab  Result Value Ref Range Status   SARS Coronavirus 2 NEGATIVE NEGATIVE Final    Comment: (NOTE) SARS-CoV-2 target nucleic acids are NOT DETECTED.  The SARS-CoV-2 RNA is generally detectable in upper and lower respiratory specimens during the acute phase of infection. Negative results do not preclude SARS-CoV-2 infection, do not rule out co-infections with other pathogens, and should not be used as the sole basis for treatment or other patient management decisions. Negative results must be combined with clinical observations, patient history, and epidemiological information. The expected result is  Negative.  Fact Sheet for Patients: SugarRoll.be  Fact Sheet for Healthcare Providers: https://www.woods-mathews.com/  This test is not yet approved or cleared by the [place] FDA and  has been authorized for detection and/or diagnosis of SARS-CoV-2 by FDA under an Emergency Use Authorization (EUA). This EUA will remain  in effect (meaning this test can be used) for the duration of the COVID-19 declaration under Se ction 564(b)(1) of the Act, 21 U.S.C. section 360bbb-3(b)(1), unless the authorization is terminated or revoked sooner.  Performed at [facility], [place] [address], [place], [place] [address]         Radiology Studies: DG Foot Complete Left  Result Date: 02/25/2020 CLINICAL DATA:  Bilateral foot wound EXAM: LEFT FOOT - COMPLETE 3+ VIEW COMPARISON:  None. FINDINGS: Bones appear diffusely demineralized. No subluxation. No soft tissue emphysema. Questionable small fracture deformity at the base of the second proximal phalanx on one view. Possible bony resorptive change at the distal phalanx on the lateral view of what appears to be second digit. IMPRESSION: 1. Possible small fracture deformity at the base of the second  proximal phalanx. Correlate for point tenderness. 2. Possible bony resorptive change/osteomyelitis at 1 of the distal phalanges on lateral view, probably second digit. Consider correlation with MRI if clinically able. 3. Diffuse bone demineralization. Electronically Signed   By: Donavan Foil M.D.   On: 02/25/2020 18:36   DG Foot Complete Right  Result Date: 02/25/2020 CLINICAL DATA:  Swelling with wounds EXAM: RIGHT FOOT COMPLETE - 3+ VIEW COMPARISON:  None. FINDINGS: Osseous demineralization. Bony destructive change in fragmentation at the first distal phalanx concerning for osteomyelitis. Foreshortened second digit possibly due to prior partial amputation at the level of the D IP joint, correlate with surgical  history. No subluxation. No soft tissue emphysema. Ulcer at the tip of the first digit. Probable ulcer plantar surface of the foot at the level of the metatarsal heads and potential ulcer at the heel pad. IMPRESSION: 1. Bony demineralization with bony destructive change and fragmentation at the first distal phalanx concerning for osteomyelitis. Electronically Signed   By: Donavan Foil M.D.   On: 02/25/2020 18:46   VAS [place] ABI WITH/WO TBI  Result Date: 02/26/2020 LOWER EXTREMITY DOPPLER STUDY Indications: Ischemic ulcers of bilateral lower extremities. Known history of              PVD.  Vascular Interventions: 05-08-2017 Stenting of RT external iliac artery and                         12-2015 stenting of RT SFA. Limitations: Today's exam was limited due to multiple open wounds. Comparison       03-25-2019 Prior ABI w/ TBI RT ABI/TBI was 0.5/0.25 LT was Study:           0.49/0 Performing Technologist: Darlin Coco RDMS  Examination Guidelines: A complete evaluation includes at minimum, Doppler waveform signals and systolic blood pressure reading at the level of bilateral brachial, anterior tibial, and posterior tibial arteries, when vessel segments are accessible. Bilateral testing is considered an integral part of a complete examination. Photoelectric Plethysmograph (PPG) waveforms and toe systolic pressure readings are included as required and additional duplex testing as needed. Limited examinations for reoccurring indications may be performed as noted.  ABI Findings: +---------+------------------+-----+-------------------+-----------------------+ Right    Rt Pressure (mmHg)IndexWaveform           Comment                 +---------+------------------+-----+-------------------+-----------------------+ Brachial 110                    triphasic                                  +---------+------------------+-----+-------------------+-----------------------+ PTA      49                0.45 dampened  monophasic                        +---------+------------------+-----+-------------------+-----------------------+ DP       47                0.43 dampened monophasic                        +---------+------------------+-----+-------------------+-----------------------+ Great Toe  Unable to assess due to                                                    toe wounds.             +---------+------------------+-----+-------------------+-----------------------+ +---------+------------------+-----+-------------------+-----------------------+ Left     Lt Pressure (mmHg)IndexWaveform           Comment                 +---------+------------------+-----+-------------------+-----------------------+ Brachial                        monophasic         Unable to take pressure                                                    per patient.            +---------+------------------+-----+-------------------+-----------------------+ PTA      44                0.40 dampened monophasic                        +---------+------------------+-----+-------------------+-----------------------+ DP       34                0.31 dampened monophasic                        +---------+------------------+-----+-------------------+-----------------------+ Great Toe                                          Unable to assess due to                                                    toe wounds.             +---------+------------------+-----+-------------------+-----------------------+ +-------+-----------+-----------+------------+------------+ ABI/TBIToday's ABIToday's TBIPrevious ABIPrevious TBI +-------+-----------+-----------+------------+------------+ Right  0.45                  0.50        0.25         +-------+-----------+-----------+------------+------------+ Left   0.40                  0.49        0             +-------+-----------+-----------+------------+------------+ Right ABIs appear decreased compared to prior study on 03-25-2019. Left ABIs appear decreased compared to prior study on 03-25-2019.  Summary: Right: Resting right ankle-brachial index indicates severe right lower extremity arterial disease. Unable to assess TBI secondary to extensive toe wounds. Left: Resting left ankle-brachial index indicates severe left lower extremity arterial disease. Unable to assess TBI secondary to extensive toe wounds.  *See table(s) above for measurements and observations.  Electronically signed by Harold Barban MD on 02/26/2020 at 9:46:35 PM.    Final  Scheduled Meds: . aspirin EC  81 mg Oral Daily  . atorvastatin  10 mg Oral QHS  . Chlorhexidine Gluconate Cloth  6 each Topical Daily  . dronabinol  2.5 mg Oral BID AC  . feeding supplement  237 mL Oral BID BM  . gabapentin  300 mg Oral BID  . insulin aspart  0-9 Units Subcutaneous TID WC  . isosorbide mononitrate  30 mg Oral Daily  . levothyroxine  50 mcg Oral Daily  . loratadine  10 mg Oral Daily  . multivitamin with minerals  1 tablet Oral Daily  . pantoprazole  40 mg Oral QAC breakfast  . sodium bicarbonate  650 mg Oral TID  . vancomycin variable dose per unstable renal function (pharmacist dosing)   Does not apply See admin instructions   Continuous Infusions: . sodium chloride 50 mL/hr at 02/26/20 2033  . ceFEPime (MAXIPIME) IV 2 g (02/26/20 2207)     LOS: 2 days     Cordelia Poche, MD [facility] 02/27/2020, 9:15 AM  If 7PM-7AM, please contact night-coverage [email or web address]

## 2020-02-27 NOTE — Progress Notes (Signed)
ANTICOAGULATION & ANTIBIOTIC CONSULT NOTE  Pharmacy Consult:  Heparin + Vancomycin/Cefepime Indication:  History of DVT + foot wound/osteomyelitis   Allergies  Allergen Reactions  . Kiwi Extract Itching and Other (See Comments)    Throat itching   . Lisinopril Other (See Comments)    Bruising   . Plavix [Clopidogrel] Other (See Comments)    "Made me feel badly- didn't agree with my system"    Patient Measurements: Height: 5\' 6"  (167.6 cm) Weight: 74.8 kg (165 lb) IBW/kg (Calculated) : 59.3 Heparin Dosing Weight: 74 kg  Vital Signs: Temp: 98 F (36.7 C) (02/17 0347) BP: 107/52 (02/17 0347) Pulse Rate: 72 (02/17 0347)  Labs: Recent Labs    02/25/20 1717 02/26/20 0431  HGB 9.5* 8.9*  HCT 28.1* 28.5*  PLT 441* 435*  CREATININE 4.37* 4.13*    Estimated Creatinine Clearance: 12.7 mL/min (A) (by C-G formula based on SCr of 4.13 mg/dL (H)).   Medical History: Past Medical History:  Diagnosis Date  . Age-related nuclear cataract, right eye   . Allergy   . Anemia   . Arthritis    "joints sometimes" (12/25/2012)  . CHF (congestive heart failure) ([place])   . Chronic kidney disease    stage 4  . Clotting disorder ([place])   . Colon cancer ([place])   . Critical lower limb ischemia   . Deep vein thrombosis (DVT) ([place])   . Diverticulosis   . Gangrene of toe, Rt second toe 12/25/2012  . GERD (gastroesophageal reflux disease)   . High cholesterol   . Hypertension   . Hypothyroidism   . PAD (peripheral artery disease) ([place])   . PAD (peripheral artery disease) ([place])   . Peripheral neuropathy   . Rectal cancer ([place])   . S/P arterial stent, 12/25/13, successful diamondback orbital rotational arthrectomy, PTA using chocolate  balloon and stenting using I DEV stent of long segment calcified high-grade proximal and mid r 12/25/2012  . Tobacco abuse   . Type II diabetes mellitus (HCC)      Assessment: 38 YOF presented with food pain, found to have critical limb ischemia with  osteomyelitis of the toes and heel wound.  Pharmacy consulted to dose vancomycin and cefepime.  Renal function improving and vancomycin level is within desired range.  Will schedule vancomycin.  Afebrile, WBC WNL.  Patient has a history of DVT on Eliquis.  Pharmacy consulted to transition to IV heparin while Eliquis on hold, last dose on 02/26/20 PM.  Will use aPTT to guide heparin dosing until heparin level and aPTT correlate.  CBC stable; no bleeding reported.   Goal of Therapy:  Heparin level 0.3-0.7 units/ml aPTT 66-102 seconds Monitor platelets by anticoagulation protocol: Yes  Vanc level 15-20 mcg/mL   Plan:  Start heparin infusion at 1000 units/hr - no bolus with recent Eliquis use Check 8 hr heparin level and aPTT Daily heparin level, aPTT and CBC  Schedule vanc 750mg  IV Q48H for AUC 457 using SCr 3.66 Cefepime 2gm IV Q24H Monitor renal fxn, clinical progress, vanc level as indicated  Bo Teicher D. Mina Marble, PharmD, BCPS, [place] 02/27/2020, 1:48 PM

## 2020-02-27 NOTE — Progress Notes (Signed)
   I have spoke to the pt son [name] and he confirms that the plan is to continue treatment with the IV antibiotics for infection. He did also confirm that his mother told him last evening that she would not want any amputation.   I did explain to [name] that if his mother goes to a SNF for rehab or to use Medicare days that his mother would need to revoke the hospice medicare benefit because Medicare unfortunately will not pay for hospice care and rehab at same time. He understands. We will continue to follow and see where pt lands.   Thank you for your help with this pt.   Webb Silversmith RN [phone]

## 2020-02-28 DIAGNOSIS — M869 Osteomyelitis, unspecified: Secondary | ICD-10-CM | POA: Diagnosis not present

## 2020-02-28 DIAGNOSIS — Z9889 Other specified postprocedural states: Secondary | ICD-10-CM

## 2020-02-28 DIAGNOSIS — I739 Peripheral vascular disease, unspecified: Secondary | ICD-10-CM

## 2020-02-28 DIAGNOSIS — N179 Acute kidney failure, unspecified: Secondary | ICD-10-CM | POA: Diagnosis not present

## 2020-02-28 DIAGNOSIS — Z66 Do not resuscitate: Secondary | ICD-10-CM

## 2020-02-28 DIAGNOSIS — N184 Chronic kidney disease, stage 4 (severe): Secondary | ICD-10-CM

## 2020-02-28 DIAGNOSIS — I70229 Atherosclerosis of native arteries of extremities with rest pain, unspecified extremity: Secondary | ICD-10-CM

## 2020-02-28 DIAGNOSIS — I5022 Chronic systolic (congestive) heart failure: Secondary | ICD-10-CM

## 2020-02-28 DIAGNOSIS — I10 Essential (primary) hypertension: Secondary | ICD-10-CM | POA: Diagnosis not present

## 2020-02-28 DIAGNOSIS — M86672 Other chronic osteomyelitis, left ankle and foot: Secondary | ICD-10-CM

## 2020-02-28 DIAGNOSIS — E119 Type 2 diabetes mellitus without complications: Secondary | ICD-10-CM

## 2020-02-28 DIAGNOSIS — L97511 Non-pressure chronic ulcer of other part of right foot limited to breakdown of skin: Secondary | ICD-10-CM

## 2020-02-28 DIAGNOSIS — Z7189 Other specified counseling: Secondary | ICD-10-CM

## 2020-02-28 DIAGNOSIS — Z515 Encounter for palliative care: Secondary | ICD-10-CM

## 2020-02-28 LAB — RENAL FUNCTION PANEL
Albumin: 1.3 g/dL — ABNORMAL LOW (ref 3.5–5.0)
Anion gap: 15 (ref 5–15)
BUN: 64 mg/dL — ABNORMAL HIGH (ref 8–23)
CO2: 23 mmol/L (ref 22–32)
Calcium: 7.3 mg/dL — ABNORMAL LOW (ref 8.9–10.3)
Chloride: 99 mmol/L (ref 98–111)
Creatinine, Ser: 3.42 mg/dL — ABNORMAL HIGH (ref 0.44–1.00)
GFR, Estimated: 14 mL/min — ABNORMAL LOW (ref >60)
Glucose, Bld: 93 mg/dL (ref 70–99)
Phosphorus: 4 mg/dL (ref 2.5–4.6)
Potassium: 3 mmol/L — ABNORMAL LOW (ref 3.5–5.1)
Sodium: 137 mmol/L (ref 135–145)

## 2020-02-28 LAB — CBC
HCT: 26.6 % — ABNORMAL LOW (ref 36.0–46.0)
Hemoglobin: 8.9 g/dL — ABNORMAL LOW (ref 12.0–15.0)
MCH: 33.2 pg (ref 26.0–34.0)
MCHC: 33.5 g/dL (ref 30.0–36.0)
MCV: 99.3 fL (ref 80.0–100.0)
Platelets: 489 10*3/uL — ABNORMAL HIGH (ref 150–400)
RBC: 2.68 MIL/uL — ABNORMAL LOW (ref 3.87–5.11)
RDW: 13.9 % (ref 11.5–15.5)
WBC: 6.8 10*3/uL (ref 4.0–10.5)
nRBC: 0 % (ref 0.0–0.2)

## 2020-02-28 LAB — APTT
aPTT: 97 seconds — ABNORMAL HIGH (ref 24–36)
aPTT: 81 seconds — ABNORMAL HIGH (ref 24–36)

## 2020-02-28 LAB — GLUCOSE, CAPILLARY
Glucose-Capillary: 75 mg/dL (ref 70–99)
Glucose-Capillary: 141 mg/dL — ABNORMAL HIGH (ref 70–99)
Glucose-Capillary: 97 mg/dL (ref 70–99)
Glucose-Capillary: 103 mg/dL — ABNORMAL HIGH (ref 70–99)

## 2020-02-28 LAB — HEPARIN LEVEL (UNFRACTIONATED): Heparin Unfractionated: >2.2 IU/mL — ABNORMAL HIGH (ref 0.30–0.70)

## 2020-02-28 MED ORDER — POTASSIUM CHLORIDE CRYS ER 20 MEQ PO TBCR
40.0000 meq | EXTENDED_RELEASE_TABLET | ORAL | Status: AC
  Administered 2020-02-28 (×2): 40 meq via ORAL
  Filled 2020-02-28 (×2): qty 2

## 2020-02-28 MED ORDER — PROSOURCE PLUS PO LIQD
30.0000 mL | Freq: Three times a day (TID) | ORAL | Status: DC
  Administered 2020-02-28 – 2020-03-03 (×11): 30 mL via ORAL
  Filled 2020-02-28 (×11): qty 30

## 2020-02-28 NOTE — Consult Note (Addendum)
Cardiology Consultation:   Patient ID: Nicole Sellers; [id number]; Mar 25, 1950   Admit date: 02/25/2020 Date of Consult: 02/28/2020  Primary Care Provider: Reynold Bowen, MD Primary Cardiologist: Dr. Quay Burow, MD   Patient Profile:   Nicole Sellers is a 71 y.o. female with a hx of severe PAD s/p PTA and stenting of right superficial femoral artery in 2014 for an ischemic toe which ultimately required second right toe amputation and PTA and stenting of right external iliac artery in 04/2017, bilateral carotid artery disease with 60-79% stenosis of left ICA and 1-39% stenosis of right ICA, DVT in 11/2018 which initially went untreated but started on Eliquis in 03/2019, hypertension, hyperlipidemia, DM2 neuropathy, CKD stage IV-V, hypothyroidism, anemia, and stage III rectal cancer diagnosed in 2019 s/p chemo and resection who is being seen today for the evaluation of LE non-healing wounds with known PAD at the request of Dr. Teryl Lucy.  History of Present Illness:   Nicole Sellers is a 70yo F with a hx as stated above who presented to [facility] 02/25/20 with worsening bilateral foot pain with question of infection. HPI obtained from chart review as she is minimally responsive to my questioning. She has been followed by outpatient hospice along with wound care and was recently evaluated in her home found to have worsening LE wounds with evidence of infection, she was transferred to the ED for further evaluation. Wound care has been following her twice per week for chronic wounds. She has been steadily declining over the last several months with weight loss, poor appetite and difficutly with swallowing.   In the ED, imagining revealed right big toe osteomyelitis with possible osteomyelitis on the left first distal phalanges. Creatinine on presentation found to be 4.3 with a baseline at 2.0. As above she has a hx of PTA/stent to the right SFA in 2014 due to ischemic toe and ultimately required 2nd toe  amputation. She then underwent PTA/stenting to right external iliac artery in 04/2017. LE dopplers from 03/2019 showed patent right SFA stent and 30-40% stenosis of left common femoral artery as well as occlusion of the right peroneal artery, left SFA with reconstition in the distal segment, and left peroneal artery. Bilateral ABIs and TBIs decreased from 2019 studies. Primary admitting team repeat study this admission that showed severe bilateral arterial disease with inability to assess TBI due to extensive wounds (previous R-0.50, L-0.49; today R-0.45, L-0.40).   She was started on abx per primary team. Cariology was consulted 02/28/20 for treament recommendations for PAD wounds. At this time, she does not want to consider amputation. She has not been seen by OP cardiology follow up since 05/2018.   She was seen by our team 05/2018 for pre-op evaluation for colectomy due to  stage III rectal cancer. She had chest pain with chemotherapy therefore a Myoview was ordered that showed a small defect in the basal anterior location but was considered low risk overall. She has been admitted several times for AKI and esophagitis. Renal ultrasound with suboptimal view of left kidney but otherwise unremarkable. Nephrology has been following her. Patient has had issues with severe anemia, at one point with a Hb of 6.3 but with persistant refusal of transfusion. She was found to have a right lower extremity DVT in 11/2018 but was not started on anticoagulation due to loss of follow-up. Repeat dopplers showed persistent DVT therefore she was started on Eliquis 5mg  twice daily.   She was last seen by our service during hospital  consultation 05/2019 after presenting with weakness, shortness of breath, and edema found to have new onset CHF with an LVEF at 15% with new LBBB on EKG. There was discussion that under normal circumstances she would need coronary angiography however this was deferred several days due to poor renal  function. She was diuresed with IV Lasix for CHF with some improvement in her renal function and volume status. She ultimately declined further ischemic workup out of concern for the need for HD. At cardiology sign-off she was transitioned to torsemide and Bidil was added to her regimen. There were no issues with her PAD at that time however she has been lost to follow up since that time.    Past Medical History:  Diagnosis Date   Age-related nuclear cataract, right eye    Allergy    Anemia    Arthritis    "joints sometimes" (12/25/2012)   CHF (congestive heart failure) (HCC)    Chronic kidney disease    stage 4   Clotting disorder (HCC)    Colon cancer (HCC)    Critical lower limb ischemia    Deep vein thrombosis (DVT) (HCC)    Diverticulosis    Gangrene of toe, Rt second toe 12/25/2012   GERD (gastroesophageal reflux disease)    High cholesterol    Hypertension    Hypothyroidism    PAD (peripheral artery disease) (HCC)    PAD (peripheral artery disease) (HCC)    Peripheral neuropathy    Rectal cancer (HCC)    S/P arterial stent, 12/25/13, successful diamondback orbital rotational arthrectomy, PTA using chocolate  balloon and stenting using I DEV stent of long segment calcified high-grade proximal and mid r 12/25/2012   Tobacco abuse    Type II diabetes mellitus ([place])     Past Surgical History:  Procedure Laterality Date   ABDOMINAL AORTAGRAM  12/20/2012   Procedure: ABDOMINAL Maxcine Ham;  Surgeon: Lorretta Harp, MD;  Location: [facility] CATH LAB;  Service: Cardiovascular;;   ABDOMINAL HYSTERECTOMY  2000   ANGIOPLASTY / STENTING FEMORAL Right 12/25/2012   ATHERECTOMY Right 12/25/2012   Procedure: ATHERECTOMY;  Surgeon: Lorretta Harp, MD;  Location: [facility] CATH LAB;  Service: Cardiovascular;  Laterality: Right;  right SFA   COLONOSCOPY     DIVERTING ILEOSTOMY N/A 08/03/2018   Procedure: DIVERTING LOOP ILEOSTOMY;  Surgeon: Leighton Ruff, MD;  Location: [facility] ORS;  Service: General;   Laterality: N/A;   ESOPHAGOGASTRODUODENOSCOPY N/A 10/04/2018   Procedure: ESOPHAGOGASTRODUODENOSCOPY (EGD);  Surgeon: Carol Ada, MD;  Location: [facility] ENDOSCOPY;  Service: Endoscopy;  Laterality: N/A;   EYE SURGERY Left    cataract   HERNIA REPAIR     IR THORACENTESIS ASP PLEURAL SPACE W/IMG GUIDE  07/02/2019   IR THORACENTESIS ASP PLEURAL SPACE W/IMG GUIDE  07/03/2019   LOWER EXTREMITY ANGIOGRAM N/A 12/20/2012   Procedure: LOWER EXTREMITY ANGIOGRAM;  Surgeon: Lorretta Harp, MD;  Location: [facility] CATH LAB;  Service: Cardiovascular;  Laterality: N/A;   LOWER EXTREMITY ANGIOGRAM N/A 12/25/2012   Procedure: LOWER EXTREMITY ANGIOGRAM;  Surgeon: Lorretta Harp, MD;  Location: [facility] CATH LAB;  Service: Cardiovascular;  Laterality: N/A;   LOWER EXTREMITY INTERVENTION  05/08/2017   LOWER EXTREMITY INTERVENTION Bilateral 05/08/2017   Procedure: LOWER EXTREMITY INTERVENTION;  Surgeon: Lorretta Harp, MD;  Location: [place] CV LAB;  Service: Cardiovascular;  Laterality: Bilateral;   PERIPHERAL VASCULAR BALLOON ANGIOPLASTY Right 05/08/2017   Procedure: PERIPHERAL VASCULAR BALLOON ANGIOPLASTY;  Surgeon: Lorretta Harp, MD;  Location: [place] CV  LAB;  Service: Cardiovascular;  Laterality: Right;  sfa   PERIPHERAL VASCULAR INTERVENTION Right 05/08/2017   Procedure: PERIPHERAL VASCULAR INTERVENTION;  Surgeon: Lorretta Harp, MD;  Location: [place] CV LAB;  Service: Cardiovascular;  Laterality: Right;  ext iliac   PORTACATH PLACEMENT Right 11/22/2017   Procedure: INSERTION PORT-A-CATH;  Surgeon: Leighton Ruff, MD;  Location: [facility] ORS;  Service: General;  Laterality: Right;   SAVORY DILATION N/A 10/04/2018   Procedure: Azzie Almas DILATION;  Surgeon: Carol Ada, MD;  Location: [facility] ENDOSCOPY;  Service: Endoscopy;  Laterality: N/A;   TOE SURGERY Right    2n toe    UMBILICAL HERNIA REPAIR  [id number]   UMBILICAL HERNIA REPAIR N/A 08/03/2018   Procedure: UMBILICAL HERNIA REPAIR;  Surgeon: Leighton Ruff, MD;   Location: [facility] ORS;  Service: General;  Laterality: N/A;   XI ROBOTIC ASSISTED LOWER ANTERIOR RESECTION N/A 08/03/2018   Procedure: XI ROBOTIC ASSISTED LOWER ANTERIOR RESECTION; DIAGNOSTIC FLEXIBLE SIGMOIDOSCOPY; INTRAOPERATIVE ASSESSMENT OF VASCULAR PERFUSION;  Surgeon: Leighton Ruff, MD;  Location: [facility] ORS;  Service: General;  Laterality: N/A;     Prior to Admission medications   Medication Sig Start Date End Date Taking? Authorizing Provider  albuterol (PROVENTIL HFA;VENTOLIN HFA) 108 (90 BASE) MCG/ACT inhaler Inhale 2 puffs into the lungs every 6 (six) hours as needed for wheezing or shortness of breath.   Yes [provider]  apixaban (ELIQUIS) 5 MG TABS tablet Take 1 tablet (5 mg total) by mouth 2 (two) times daily. 07/04/19  Yes Eugenie Filler, MD  aspirin 81 MG tablet Take 81 mg by mouth daily.   Yes [provider]  atorvastatin (LIPITOR) 10 MG tablet Take 1 tablet (10 mg total) by mouth daily. Patient taking differently: Take 10 mg by mouth at bedtime. 01/01/18  Yes Minette Brine, FNP  carvedilol (COREG) 3.125 MG tablet Take 1 tablet (3.125 mg total) by mouth 2 (two) times daily with a meal. 07/04/19  Yes Eugenie Filler, MD  cetirizine (ZYRTEC) 10 MG tablet Take 10 mg by mouth daily.   Yes [provider]  gabapentin (NEURONTIN) 300 MG capsule Take 300 mg by mouth 2 (two) times daily.    Yes [provider]  hydrALAZINE (APRESOLINE) 50 MG tablet Take 1 tablet (50 mg total) by mouth every 6 (six) hours. 07/04/19  Yes Eugenie Filler, MD  isosorbide mononitrate (IMDUR) 30 MG 24 hr tablet Take 1 tablet (30 mg total) by mouth daily. 07/05/19  Yes Eugenie Filler, MD  levothyroxine (SYNTHROID) 50 MCG tablet Take 1 tablet (50 mcg total) by mouth daily. 07/04/19  Yes Eugenie Filler, MD  nitroGLYCERIN (NITROSTAT) 0.4 MG SL tablet Place 1 tablet (0.4 mg total) under the tongue every 5 (five) minutes as needed for chest pain. 07/04/19  Yes Eugenie Filler, MD  pantoprazole (PROTONIX) 40 MG tablet Take 1 tablet (40 mg total) by mouth daily before breakfast. 07/05/19  Yes Eugenie Filler, MD  sodium bicarbonate 650 MG tablet Take 1 tablet (650 mg total) by mouth 3 (three) times daily. 07/04/19  Yes Eugenie Filler, MD  torsemide (DEMADEX) 20 MG tablet Take 4 tablets (80 mg total) by mouth 2 (two) times daily. 07/04/19  Yes Eugenie Filler, MD  traMADol (ULTRAM) 50 MG tablet Take 1 tablet (50 mg total) by mouth every 12 (twelve) hours as needed for moderate pain. 04/04/19  Yes Donne Hazel, MD    Inpatient Medications: Scheduled Meds:  aspirin EC  81  mg Oral Daily   atorvastatin  10 mg Oral QHS   Chlorhexidine Gluconate Cloth  6 each Topical Daily   dronabinol  2.5 mg Oral BID AC   feeding supplement  237 mL Oral BID BM   gabapentin  300 mg Oral BID   insulin aspart  0-9 Units Subcutaneous TID WC   levothyroxine  50 mcg Oral Daily   loratadine  10 mg Oral Daily   multivitamin with minerals  1 tablet Oral Daily   pantoprazole  40 mg Oral QAC breakfast   potassium chloride  40 mEq Oral Q4H   sodium bicarbonate  650 mg Oral TID   Continuous Infusions:  sodium chloride 50 mL/hr at 02/28/20 0818   ceFEPime (MAXIPIME) IV 2 g (02/27/20 2139)   heparin 800 Units/hr (02/27/20 2232)   vancomycin 750 mg (02/27/20 1543)   PRN Meds: acetaminophen **OR** acetaminophen, albuterol, hydrALAZINE, HYDROmorphone (DILAUDID) injection, oxyCODONE-acetaminophen, sodium chloride flush  Allergies:    Allergies  Allergen Reactions   Kiwi Extract Itching and Other (See Comments)    Throat itching    Lisinopril Other (See Comments)    Bruising    Plavix [Clopidogrel] Other (See Comments)    "Made me feel badly- didn't agree with my system"    Social History:   Social History   Socioeconomic History   Marital status: Single    Spouse name: Not on file   Number of children: 1   Years of education: Not on file   Highest education  level: Not on file  Occupational History   Occupation: CNA    Comment: Retired   Tobacco Use   Smoking status: Former Smoker    Packs/day: 0.50    Years: 48.00    Pack years: 24.00    Types: Cigarettes   Smokeless tobacco: Never Used   Tobacco comment: quit in 2016  Vaping Use   Vaping Use: Never used  Substance and Sexual Activity   Alcohol use: No    Alcohol/week: 0.0 standard drinks   Drug use: No   Sexual activity: Not Currently  Other Topics Concern   Not on file  Social History Narrative   Not on file   Social Determinants of Health   Financial Resource Strain: Not on file  Food Insecurity: Not on file  Transportation Needs: Not on file  Physical Activity: Not on file  Stress: Not on file  Social Connections: Not on file  Intimate Partner Violence: Not on file    Family History:   Family History  Problem Relation Age of Onset   Hypertension Mother    Stroke Mother    Cancer Father        prostate   Mental illness Sister    Diabetes Brother    Hypertension Brother    Cancer Brother 57       prostate   Diabetes Brother    Hypertension Brother    Cancer Brother        "tumors on neck and body"    Family Status:  Family Status  Relation Name Status   Mother  Deceased   Father  Deceased   Sister  Alive   Brother  Alive   Brother  Alive    ROS:  Please see the history of present illness.  All other ROS reviewed and negative.     Physical Exam/Data:   Vitals:   02/27/20 0347 02/27/20 1401 02/27/20 1950 02/28/20 0400  BP: (!) 107/52 (!) 87/57 (!) 102/56 Marland Kitchen)  100/54  Pulse: 72 72 73 73  Resp: 18 17 17 17   Temp: 98 F (36.7 C) (!) 97.4 F (36.3 C) 97.9 F (36.6 C) 98 F (36.7 C)  TempSrc:  Oral Oral Oral  SpO2: 98% 97% 98% 97%  Weight:      Height:        Intake/Output Summary (Last 24 hours) at 02/28/2020 1245 Last data filed at 02/28/2020 0830 Gross per 24 hour  Intake 540 ml  Output 403 ml  Net 137 ml   Filed Weights   02/25/20  1700  Weight: 74.8 kg   Body mass index is 26.63 kg/m.   General: Ill appearing, NAD Skin: Bilateral right and left toe wounds with odor. Unna boots in place  Lungs:Clear to ausculation bilaterally. Breathing is unlabored. Cardiovascular: RRR with S1 S2. No murmurs Abdomen: Soft, non-tender, non-distended. No obvious abdominal masses. Extremities: As above, bilateral right and left toe open wounds with odor and serosanguineous drainage Neuro: Opens eyes to voice command but no other response.    EKG:  The EKG was personally reviewed and demonstrates: 02/25/20 NSR with ventricular bigeminy, non-specific TW abnormalities however these seems to have been present on prior tracings. IVCD, HR 84bpm Telemetry:  Telemetry was personally reviewed and demonstrates: 02/28/20 NSR 70's   Relevant CV Studies:  Lexiscan Myoview 06/14/2018: Nuclear stress EF: 56%. The left ventricular ejection fraction is normal (55-65%). There was no ST segment deviation noted during stress. No T wave inversion was noted during stress. Defect 1: There is a small defect of mild severity present in the basal anterior location. The study is normal. This is a low risk study.   Low risk stress nuclear study with normal perfusion and normal left ventricular regional and global systolic function. _______________   Echocardiogram 05/22/2019: Impressions:  1. RVEF is moderately decreased.   2. Since the last study on 03/31/2019 there has been a significant change,  LVEF has decreased from 50-55% (with akinesis in the basal and mid  anteroseptal and anterior walls) to 15-20% with severe diffuse hypokinesis  and paradoxical septal motion.   3. There is no thrombus on the left ventricle (on Definity echo contrast  imaging).   4. Left ventricular ejection fraction, by estimation, is 20 to 25%. The  left ventricle has severely decreased function. The left ventricle  demonstrates global hypokinesis. The left ventricular  internal cavity size  was moderately dilated. Left  ventricular diastolic parameters are consistent with Grade II diastolic  dysfunction (pseudonormalization). Elevated left atrial pressure. The  average left ventricular global longitudinal strain is -7.0 %.   5. Right ventricular systolic function is moderately reduced. The right  ventricular size is moderately enlarged. There is mildly elevated  pulmonary artery systolic pressure. The estimated right ventricular  systolic pressure is 28.4 mmHg.   6. Left atrial size was moderately dilated.   7. Right atrial size was mildly dilated.   8. The mitral valve is normal in structure. Moderate mitral valve  regurgitation. No evidence of mitral stenosis.   9. Tricuspid valve regurgitation is moderate.  10. The aortic valve is normal in structure. Aortic valve regurgitation is  mild. Mild to moderate aortic valve sclerosis/calcification is present,  without any evidence of aortic stenosis.  11. The inferior vena cava is normal in size with greater than 50%  respiratory variability, suggesting right atrial pressure of 3 mmHg.    ABI 02/26/20:  Right ABIs appear decreased compared to prior study on 03-25-2019. Left  ABIs appear decreased compared to prior study on 03-25-2019.     Summary:  Right: Resting right ankle-brachial index indicates severe right lower  extremity arterial disease.   Unable to assess TBI secondary to extensive toe wounds.   Left: Resting left ankle-brachial index indicates severe left lower  extremity arterial disease.   Unable to assess TBI secondary to extensive toe wounds.     *See table(s) above for measurements and observations.     Laboratory Data:  Chemistry Recent Labs  Lab 02/26/20 0431 02/27/20 0925 02/28/20 0425  NA 132* 134* 137  K 3.6 3.5 3.0*  CL 95* 100 99  CO2 24 24 23   GLUCOSE 151* 112* 93  BUN 67* 67* 64*  CREATININE 4.13* 3.66* 3.42*  CALCIUM 7.3* 7.3* 7.3*  GFRNONAA 11* 13* 14*   ANIONGAP 13 10 15     Total Protein  Date Value Ref Range Status  02/25/2020 6.2 (L) 6.5 - 8.1 g/dL Final  08/10/2017 7.1 6.0 - 8.5 g/dL Final   Albumin  Date Value Ref Range Status  02/28/2020 1.3 (L) 3.5 - 5.0 g/dL Final  08/10/2017 4.2 3.6 - 4.8 g/dL Final   AST  Date Value Ref Range Status  02/25/2020 15 15 - 41 U/L Final  11/15/2018 19 15 - 41 U/L Final   ALT  Date Value Ref Range Status  02/25/2020 7 0 - 44 U/L Final  11/15/2018 13 0 - 44 U/L Final   Alkaline Phosphatase  Date Value Ref Range Status  02/25/2020 120 38 - 126 U/L Final   Total Bilirubin  Date Value Ref Range Status  02/25/2020 0.7 0.3 - 1.2 mg/dL Final  11/15/2018 0.2 (L) 0.3 - 1.2 mg/dL Final   Hematology Recent Labs  Lab 02/26/20 0431 02/27/20 0925 02/28/20 0425  WBC 8.1 7.4 6.8  RBC 2.80* 2.62* 2.68*  HGB 8.9* 8.3* 8.9*  HCT 28.5* 26.4* 26.6*  MCV 101.8* 100.8* 99.3  MCH 31.8 31.7 33.2  MCHC 31.2 31.4 33.5  RDW 13.6 13.6 13.9  PLT 435* 457* 489*   Cardiac EnzymesNo results for input(s): TROPONINI in the last 168 hours. No results for input(s): TROPIPOC in the last 168 hours.  BNPNo results for input(s): BNP, PROBNP in the last 168 hours.  DDimer No results for input(s): DDIMER in the last 168 hours. TSH:  Lab Results  Component Value Date   TSH 2.391 02/25/2020   Lipids: Lab Results  Component Value Date   CHOL 113 05/23/2019   HDL 32 (L) 05/23/2019   LDLCALC 53 05/23/2019   TRIG 140 05/23/2019   CHOLHDL 3.5 05/23/2019   HgbA1c: Lab Results  Component Value Date   HGBA1C 6.0 (H) 02/25/2020    Radiology/Studies:  DG Foot Complete Left  Result Date: 02/25/2020 CLINICAL DATA:  Bilateral foot wound EXAM: LEFT FOOT - COMPLETE 3+ VIEW COMPARISON:  None. FINDINGS: Bones appear diffusely demineralized. No subluxation. No soft tissue emphysema. Questionable small fracture deformity at the base of the second proximal phalanx on one view. Possible bony resorptive change at the  distal phalanx on the lateral view of what appears to be second digit. IMPRESSION: 1. Possible small fracture deformity at the base of the second proximal phalanx. Correlate for point tenderness. 2. Possible bony resorptive change/osteomyelitis at 1 of the distal phalanges on lateral view, probably second digit. Consider correlation with MRI if clinically able. 3. Diffuse bone demineralization. Electronically Signed   By: Donavan Foil M.D.   On: 02/25/2020 18:36   DG  Foot Complete Right  Result Date: 02/25/2020 CLINICAL DATA:  Swelling with wounds EXAM: RIGHT FOOT COMPLETE - 3+ VIEW COMPARISON:  None. FINDINGS: Osseous demineralization. Bony destructive change in fragmentation at the first distal phalanx concerning for osteomyelitis. Foreshortened second digit possibly due to prior partial amputation at the level of the D IP joint, correlate with surgical history. No subluxation. No soft tissue emphysema. Ulcer at the tip of the first digit. Probable ulcer plantar surface of the foot at the level of the metatarsal heads and potential ulcer at the heel pad. IMPRESSION: 1. Bony demineralization with bony destructive change and fragmentation at the first distal phalanx concerning for osteomyelitis. Electronically Signed   By: Donavan Foil M.D.   On: 02/25/2020 18:46   VAS [place] ABI WITH/WO TBI  Result Date: 02/26/2020 LOWER EXTREMITY DOPPLER STUDY Indications: Ischemic ulcers of bilateral lower extremities. Known history of              PVD.  Vascular Interventions: 05-08-2017 Stenting of RT external iliac artery and                         12-2015 stenting of RT SFA. Limitations: Today's exam was limited due to multiple open wounds. Comparison       03-25-2019 Prior ABI w/ TBI RT ABI/TBI was 0.5/0.25 LT was Study:           0.49/0 Performing Technologist: Darlin Coco RDMS  Examination Guidelines: A complete evaluation includes at minimum, Doppler waveform signals and systolic blood pressure reading at the  level of bilateral brachial, anterior tibial, and posterior tibial arteries, when vessel segments are accessible. Bilateral testing is considered an integral part of a complete examination. Photoelectric Plethysmograph (PPG) waveforms and toe systolic pressure readings are included as required and additional duplex testing as needed. Limited examinations for reoccurring indications may be performed as noted.  ABI Findings: +---------+------------------+-----+-------------------+-----------------------+ Right    Rt Pressure (mmHg)IndexWaveform           Comment                 +---------+------------------+-----+-------------------+-----------------------+ Brachial 110                    triphasic                                  +---------+------------------+-----+-------------------+-----------------------+ PTA      49                0.45 dampened monophasic                        +---------+------------------+-----+-------------------+-----------------------+ DP       47                0.43 dampened monophasic                        +---------+------------------+-----+-------------------+-----------------------+ Great Toe                                          Unable to assess due to  toe wounds.             +---------+------------------+-----+-------------------+-----------------------+ +---------+------------------+-----+-------------------+-----------------------+ Left     Lt Pressure (mmHg)IndexWaveform           Comment                 +---------+------------------+-----+-------------------+-----------------------+ Brachial                        monophasic         Unable to take pressure                                                    per patient.            +---------+------------------+-----+-------------------+-----------------------+ PTA      44                0.40 dampened monophasic                         +---------+------------------+-----+-------------------+-----------------------+ DP       34                0.31 dampened monophasic                        +---------+------------------+-----+-------------------+-----------------------+ Great Toe                                          Unable to assess due to                                                    toe wounds.             +---------+------------------+-----+-------------------+-----------------------+ +-------+-----------+-----------+------------+------------+ ABI/TBIToday's ABIToday's TBIPrevious ABIPrevious TBI +-------+-----------+-----------+------------+------------+ Right  0.45                  0.50        0.25         +-------+-----------+-----------+------------+------------+ Left   0.40                  0.49        0            +-------+-----------+-----------+------------+------------+ Right ABIs appear decreased compared to prior study on 03-25-2019. Left ABIs appear decreased compared to prior study on 03-25-2019.  Summary: Right: Resting right ankle-brachial index indicates severe right lower extremity arterial disease. Unable to assess TBI secondary to extensive toe wounds. Left: Resting left ankle-brachial index indicates severe left lower extremity arterial disease. Unable to assess TBI secondary to extensive toe wounds.  *See table(s) above for measurements and observations.  Electronically signed by Harold Barban MD on 02/26/2020 at 9:46:35 PM.    Final    Assessment and Plan:   1. Severe PAD with osteomyelitis: -Pt presented to [facility]/25/20 with worsening bilateral foot pain with question of infection. She has been followed by outpatient hospice along with wound care and was recently evaluated in her home found to have worsening LE wounds with evidence of infection, she was  transferred to the ED for further evaluation.  -ED imagining revealed right big toe osteomyelitis with  possible osteomyelitis on the left first distal phalanges.  -Creatinine on presentation found to be 4.3 with a baseline at 2.0.  -Pt has a hx of PTA/stent to the right SFA in 2014 due to ischemic toe and ultimately required 2nd toe amputation. She then underwent PTA/stenting to right external iliac artery in 04/2017.  -LE dopplers from 03/2019 showed patent right SFA stent and 30-40% stenosis of left common femoral artery as well as occlusion of the right peroneal artery, left SFA with reconstition in the distal segment, and left peroneal artery. Bilateral ABIs and TBIs decreased from 2019 studies.  -Primary admitting team repeat study this admission that showed severe bilateral arterial disease with inability to assess TBI due to extensive wounds decreased when compared to prior study (previous R-0.50, L-0.49; today R-0.45, L-0.40).  -Continue ASA, statin  -IV ABx per primary team  -Will review case with Dr. Gwenlyn Found however given the extent of her disease along with her severe co-morbid conditions including worsening CKD, chronic anemia, low albumin with what seems to be failure to thrive, she will not benefit from PV intervention and is currently not a candidate. She has declined evaluation for possible amputation and likely have a very poor intra and post operative prognosis. Continue with medical management.   2. Hx of systolic CHF with presumed ischemic cardiomyopathy: -She was last seen by our team 05/2019 for CHF symptoms found to have an LVEF at 15-20%. LHC was discussed however patietn ultimately declined out of concern for HD. She was treated with IV Lasix and was eventually transisitioned to torsemide. GDMT was difficult due to hypotension and renal dysfunction . She has been lost to follow up since then -Continue ASA, statin     3. Carotid artery stenosis:  -Last study with mild right ICA and moderate left ICA on doppler 06/2018 -Continue aspirin and statin -Continue routine outpatient  monitoring   4. HTN:  -Soft, 100/54>>102/56>>87/57 -Antihypertensives being held due to hypotension    5. HLD:  -LDL 53, at goal <70 -Continue atorvastatin    6. Hx of DVT:  -Currently being treated with IV Heparin -On PTA Eliquis     7. DM2: -Hb A1C 5.4 3/202 -SSI for glucose control while inpatient status   8. CKD stage IV/V:  -Creatinine, 3.42 with a more recent baseline that appears to be in the  - improved from 3.29 on admission to 2.6 today with IV diuresis. Nephrology following. Patient is not interested in dialysis.  - Continue to monitor closely   9. Anemia of chronic disease/iron deficiency:  -Hgb, 8.9 today  -Primary following    10. Malnutrition: -with hypoalbuminemia>>1.3 -Nutrition consulted    For questions or updates, please contact [place] Please consult [email or web address] for contact info under Cardiology/STEMI.   SignedKathyrn Drown NP-C [facility] Pager: [phone] 02/28/2020 12:45 PM  Agree with note by Kathyrn Drown NP  Patient well-known to me over many years for treatment of critical limb ischemia.  I have performed multiple interventions on her right lower extremity, right external iliac artery and SFA in the past Gwenlyn Found last saw her in the office 3 years ago.  She also has presumably ischemic cardiomyopathy with an EF in the 15 to 20% range, chronic renal insufficiency with a serum creatinine in the 4 range, hypertension, hyperlipidemia and diabetes.  She was admitted with critical limb ischemia and gangrenous toes.  We are asked  to see her for PV evaluation.  She is minimally responsive.  She is a hospice patient.  I do not think that she is a candidate for an invasive intervention.  I recommend conservative local care/palliative care.   Lorretta Harp, M.D., [place], [facility], Laverta [place] [address] [place], [place]  [id number]  [phone] 02/28/2020 3:25 PM

## 2020-02-28 NOTE — Consult Note (Signed)
Palliative Medicine Inpatient Consult Note  Reason for consult:  Goals of Care "Patient on hospice but she cannot tell me why. Seems she was referred for decompensated HF. Wounds on LE would likely require amputation which she does not prefer. Ischemic workup would put at high risk for ESRD. Patient has a son but lives alone. She does not appear to be comfort measures and wants treatment from my discussion with her."  HPI:  Per intake H&P --> Nicole Sellers is a 70 y.o. female with a history of diabetes mellitus, hypertension, CKD stage IV, PVD, bilateral feet/leg wounds, CHF - EF 20-25%. Patient presented secondary to failure to thrive and worsening LE wounds.  Patient has been active with [facility]. Had been admitted on 2/15 for OM of BLE. At this point it appears family would like to continue treatment with antibiotics. Palliative care was asked to get involved to further discuss goals of care.   Clinical Assessment/Goals of Care:  *Please note that this is a verbal dictation therefore any spelling or grammatical errors are due to the "[employer]" system interpretation.  I have reviewed medical records including EPIC notes, labs and imaging, received report from bedside RN, assessed the patient who was very somnolent this afternoon. She is able to to tell me who she is and that she is in the hospital but she is not able to describe the present situation of her health.    I called to Brock Bad further discuss diagnosis prognosis, GOC, EOL wishes, disposition and options.   I introduced Palliative Medicine as specialized medical care for people living with serious illness. It focuses on providing relief from the symptoms and stress of a serious illness. The goal is to improve quality of life for both the patient and the family.  Historical review completed with patients son, Nicole Sellers: She shares that she is from [place], [place]. She has though lived in  the [place] area for the past twenty two years. She was married "may years ago" and has one son, Nicole Sellers. She loves spending time with her family, "grandchildren and great grandchildren bring joy". She is a faithful woman and was a member of the [employer].  Prior to hospitalization Nicole Sellers had been living alone in her apartment at [facility]. She had by all measures been failing most certainly in the last two weeks per my conversations [facility]. She was admitted after having been found in her own contaminants and unable to get up or even recognize the need to get up to clean herself. She was at this point deemed no longer safe to live in her home as there is not the intensive 24/7 support she needs.   Patient son, Nicole Sellers is her primary decision maker though he relies heavily on his uncle, [place] brother - Nicole Sellers.   Concepts specific to code status, artifical feeding and hydration, continued IV antibiotics and rehospitalization was had. We reviewed Wynell's prior MOST form as below:  Cardiopulmonary Resuscitation: Do Not Attempt Resuscitation (DNR/No CPR)  Medical Interventions: Limited Additional Interventions: Use medical treatment, IV fluids and cardiac monitoring as indicated, DO NOT USE intubation or mechanical ventilation. May consider use of less invasive airway support such as BiPAP or CPAP. Also provide comfort measures. Transfer to the hospital if indicated. Avoid intensive care.   Antibiotics: Determine use of limitation of antibiotics when infection occurs  IV Fluids: IV fluids for a defined trial period  Feeding Tube: No  feeding tube   Reviewed Devlynn's present ailments: DMII, CKD, CHF - 20-25% , OM. Reviewed that she had been on hospice previously which aims to provide dignity and quality at the end of life. Reviewed that Nicole Sellers has already surpassed the time that was expected for her. I shared that if we look at her overall picture we could be  causing more detriment by halting the bodies natural processes through treatment with IV ABX. I strongly advocated for allowing Nicole Sellers to be made comfortable and to allow her body to pass as peacefully as possible with comprehensive symptom relief.  Brought up patients ongoing failure to thrive and this is the bodies natural progression as various organ systems start to show dysfunction and begin to shut down. I shared that irregardless this is the process that is occurring at this time.   Nicole Sellers states that he does not know what he wants to do. He shares that [place] brother, Nicole Sellers is visiting this afternoon and this will help he and his family decide on the next best steps. He shares that we can call in the morning hours to identify the direction he plans to travel.  Nicole Sellers is very clearly not in a space where she can make these complex medical decisions and her son at this point will need to be her surrogate.   Discussed the importance of continued conversation with family and their  medical providers regarding overall plan of care and treatment options, ensuring decisions are within the context of the patients values and GOCs.  Decision Maker: Nicole Sellers (Son) [phone]  SUMMARY OF RECOMMENDATIONS  DNAR/DNI  DNR in [place] in [place]  Is presently inpatient with [facility] though throughout stay family has been in flux regarding what steps to take next  Patients son, Nicole Sellers is deciding if he would like to continue IV ABX or not. I shared with him that I do not feel these will make a great impact on her overall picture and if anything we are delaying the bodies natural processes  Plan to call patients son Nicole Sellers in the morning  Appreciate TOC team's support  Code Status/Advance Care Planning: DNAR/DNI   Palliative Prophylaxis:   Oral Care, Turn Q2H  Additional Recommendations (Limitations, Scope, Preferences):  Continue current scope of care until  additional decisions can be made   Psycho-social/Spiritual:   Desire for further Chaplaincy support: No  Additional Recommendations: Education on end of life process   Prognosis: Exceptionally poor, appears very close to end of life limited to weeks.   Discharge Planning: Unclear right now.  Vitals:   02/27/20 1950 02/28/20 0400  BP: (!) 102/56 (!) 100/54  Pulse: 73 73  Resp: 17 17  Temp: 97.9 F (36.6 C) 98 F (36.7 C)  SpO2: 98% 97%    Intake/Output Summary (Last 24 hours) at 02/28/2020 0998 Last data filed at 02/28/2020 3382 Gross per 24 hour  Intake 720 ml  Output 351 ml  Net 369 ml   Last Weight  Most recent update: 02/25/2020  5:17 PM   Weight  74.8 kg (165 lb)           Gen:  Elderly AA F in NAD HEENT: Dry mucous membranes CV: Regular rate and rhythm  PULM: poor effort, diminished at bases ABD: colostomy EXT: No edema  Neuro: Somnolent - slow to respond  PPS: 10%   This conversation/these recommendations were discussed with patient primary care team, Dr. Lonny Prude  Time In: 1430 Time  Out:  1540 Total Time: 70 Greater than 50%  of this time was spent counseling and coordinating care related to the above assessment and plan.  [place] Team Team Cell Phone: [phone] Please utilize secure chat with additional questions, if there is no response within 30 minutes please call the above phone number  Palliative Medicine Team providers are available by phone from 7am to 7pm daily and can be reached through the team cell phone.  Should this patient require assistance outside of these hours, please call the patient's attending physician.

## 2020-02-28 NOTE — Progress Notes (Signed)
ANTICOAGULATION CONSULT NOTE - Follow Up Consult  Pharmacy Consult for heparin Indication: h/o VTE  Labs: Recent Labs    02/25/20 1717 02/26/20 0431 02/27/20 0925 02/27/20 2100 02/28/20 0425  HGB 9.5* 8.9* 8.3*  --  8.9*  HCT 28.1* 28.5* 26.4*  --  26.6*  PLT 441* 435* 457*  --  489*  APTT  --   --   --  136* 97*  HEPARINUNFRC  --   --   --  >2.20*  --   CREATININE 4.37* 4.13* 3.66*  --   --     Assessment/Plan:  70yo female therapeutic on heparin after rate change. Will continue gtt at current rate and confirm stable with additional PTT.   Wynona Neat, PharmD, BCPS  02/28/2020,5:11 AM

## 2020-02-28 NOTE — Progress Notes (Signed)
Nutrition Follow-up  DOCUMENTATION CODES:   Non-severe (moderate) malnutrition in context of chronic illness  INTERVENTION:   -Continue Ensure Enlive po BID, each supplement provides 350 kcal and 20 grams of protein -Continue Magic cup BID with meals, each supplement provides 290 kcal and 9 grams of protein -Continue MVI with minerals daily -30 ml Prosource Plus TID, each supplement provides 100 kcals and 15 grams  -Downgrade diet to dysphagia 3 (advanced mechanical soft) for ease of intake  NUTRITION DIAGNOSIS:   Moderate Malnutrition related to chronic illness (CHF) as evidenced by mild fat depletion,moderate fat depletion,moderate muscle depletion,severe muscle depletion,edema.  Ongoing  GOAL:   Patient will meet greater than or equal to 90% of their needs  Progressing   MONITOR:   PO intake,Supplement acceptance,Labs,Weight trends,Skin,I & O's  REASON FOR ASSESSMENT:   Consult Assessment of nutrition requirement/status  ASSESSMENT:   Nicole Sellers is a 70 y.o. female with medical history significant of IIDM, HTN, CKD stage IV, chronic PVD with chronic bilateral feet and legs wound, DVT on Eliquis, chronic systolic CHF with LVEF 20 to 25%, stage III rectal cancer status post chemoradiation and resection, who has been under home hospice care and wound care presented with worsening of bilateral feet pain and infection.  Reviewed I/O's: +317 ml x 24 hours and +2.1 L since admission  UOP: 353 ml x 24 hours  Colostomy output: 50 ml x 24 hours  Po 25-50%  Pt lying in bed and she did not respond to voice or touch.   Noted meal completions 25-50%. Pt is taking some Ensure Enlive supplements.   Per palliative care notes, pt does not desire a feeding tube.   Pt with moderate edema, which is likely masking further weight loss as well as fat and muscle depletions.   Labs reviewed: K: 3.0, CBGS: 75-141 (inpatient orders for glycemic control are 0-9 units insulin aspart  TID with meals).   NUTRITION - FOCUSED PHYSICAL EXAM:  Flowsheet Row Most Recent Value  Orbital Region Mild depletion  Upper Arm Region Mild depletion  Thoracic and Lumbar Region No depletion  Buccal Region Mild depletion  Temple Region Mild depletion  Clavicle Bone Region Mild depletion  Clavicle and Acromion Bone Region Mild depletion  Scapular Bone Region Mild depletion  Dorsal Hand Mild depletion  Patellar Region No depletion  Anterior Thigh Region No depletion  Posterior Calf Region No depletion  Edema (RD Assessment) Moderate  Hair Reviewed  Eyes Reviewed  Mouth Reviewed  Skin Reviewed  Nails Reviewed       Diet Order:   Diet Order            Diet renal/carb modified with fluid restriction Diet-HS Snack? Nothing; Fluid restriction: 1200 mL Fluid; Room service appropriate? Yes; Fluid consistency: Thin  Diet effective now                 EDUCATION NEEDS:   No education needs have been identified at this time  Skin:  Skin Assessment: Skin Integrity Issues: Skin Integrity Issues:: DTI DTI: lt foot  Last BM:  02/27/20 (via colsotomy)  Height:   Ht Readings from Last 1 Encounters:  02/25/20 5\' 6"  (1.676 m)    Weight:   Wt Readings from Last 1 Encounters:  02/25/20 74.8 kg    Ideal Body Weight:  59.1 kg  BMI:  Body mass index is 26.63 kg/m.  Estimated Nutritional Needs:   Kcal:  2050-2250  Protein:  115-130 grams  Fluid:  1.2  Ricka Burdock, RD, LDN, [place] Registered Dietitian II Certified Diabetes Care and Education Specialist Please refer to [facility] for RD and/or RD on-call/weekend/after hours pager

## 2020-02-28 NOTE — Progress Notes (Signed)
Nicole Sellers PNT:[id number] DOB:Nov 20, 1950 OA:02/25/2020  PODIATRY PROGRESS NOTE  Chief complaint: OM left great toe.  Heel ulcer RT. B/L foot ulcers  Brief narrative: 70 y.o. female PMHx type II DM, HTN, CKD stage IV, chronic PVD with chronic bilateral foot and leg wounds, DVT on Eliquis,chronic systolic CHF with LVEF 20 to 25%, stage III rectal cancer status post chemoradiation and resection, who has been under home hospice care and wound care presented with worsening of bilateral feet pain and infection.  Patient presented to the ED with worsening deterioration of the foot wounds and admitted podiatry consulted.  Vascular ABI performed 02/26/2020.  Patient followed closely with cardiology.  Known history of prior amputation and severe PAD.  Resting comfortably in bed this evening  Past Medical History:  Diagnosis Date  . Age-related nuclear cataract, right eye   . Allergy   . Anemia   . Arthritis    "joints sometimes" (12/25/2012)  . CHF (congestive heart failure) ([facility])   . Chronic kidney disease    stage 4  . Clotting disorder ([place])   . Colon cancer ([facility])   . Critical lower limb ischemia   . Deep vein thrombosis (DVT) ([place])   . Diverticulosis   . Gangrene of toe, Rt second toe 12/25/2012  . GERD (gastroesophageal reflux disease)   . High cholesterol   . Hypertension   . Hypothyroidism   . PAD (peripheral artery disease) ([place])   . PAD (peripheral artery disease) ([facility])   . Peripheral neuropathy   . Rectal cancer ([facility])   . S/P arterial stent, 12/25/13, successful diamondback orbital rotational arthrectomy, PTA using chocolate  balloon and stenting using I DEV stent of long segment calcified high-grade proximal and mid r 12/25/2012  . Tobacco abuse   . Type II diabetes mellitus ([facility])    CBC Latest Ref Rng & Units 02/28/2020 02/27/2020 02/26/2020  WBC 4.0 - 10.5 K/uL 6.8 7.4 8.1  Hemoglobin 12.0 - 15.0 g/dL 8.9(L) 8.3(L) 8.9(L)  Hematocrit 36.0 - 46.0 %  26.6(L) 26.4(L) 28.5(L)  Platelets 150 - 400 K/uL 489(H) 457(H) 435(H)   BMP Latest Ref Rng & Units 02/28/2020 02/27/2020 02/26/2020  Glucose 70 - 99 mg/dL 93 112(H) 151(H)  BUN 8 - 23 mg/dL 64(H) 67(H) 67(H)  Creatinine 0.44 - 1.00 mg/dL 3.42(H) 3.66(H) 4.13(H)  BUN/Creat Ratio 12 - 28 - - -  Sodium 135 - 145 mmol/L 137 134(L) 132(L)  Potassium 3.5 - 5.1 mmol/L 3.0(L) 3.5 3.6  Chloride 98 - 111 mmol/L 99 100 95(L)  CO2 22 - 32 mmol/L 23 24 24   Calcium 8.9 - 10.3 mg/dL 7.3(L) 7.3(L) 7.3(L)   Physical exam: General: Resting comfortably.  No apparent distress  Objective: Physical Exam General: The patient is alert and oriented x3 in no acute distress.  Dermatology: Skin is cool and dry.  Ischemic ulcers noted bilateral lower extremities.  Mild malodor noted.  Vascular: Severe PAD.  Followed by cardiology  Neurological: Epicritic and protective threshold diminished bilaterally.   Musculoskeletal Exam: Patient is nonambulatory and bedbound.  History of prior toe amputation  Assessment/plan of care: Severe PAD with osteomyelitis/bilateral lower extremity wounds -Given the severity of the peripheral arterial disease, the poor health of the patient currently under hospice care, I do not feel that surgical debridement would be of any benefit for the patient.  Recommend conservative and palliative care. -Recommend Betadine painted to the wounds every other day with a light dressing -Continue offloading bunny boots -Continue wound care order  consisting of iodine on the wound to the bilateral feet and toes.  Allowed to air dry.  Place feet in Prevalon boots. -Podiatry to sign off.  *Please contact me directly with any questions or concerns.  Cell [phone]  Edrick Kins, DPM [facility]  Dr. Edrick Kins, DPM    [address], [place] [address]                Office [phone]  Fax [phone]

## 2020-02-28 NOTE — Progress Notes (Signed)
PROGRESS NOTE    Nicole Sellers  CBJ:[id number] DOB: 1950-08-14 DOA: 02/25/2020 PCP: Reynold Bowen, MD   Brief Narrative: Nicole Sellers is a 70 y.o. female with a history of diabetes mellitus, hypertension, CKD stage IV, PVD, bilateral feet/leg wounds, CHF. Patient presented secondary to failure to thrive and worsening LE wounds.   Assessment & Plan:   Principal Problem:   Osteomyelitis ([place]) Active Problems:   Essential hypertension   Non-insulin treated type 2 diabetes mellitus (HCC)   PAD (peripheral artery disease) Rt ABI 0.7, Lt ABI 0.46   AKI (acute kidney injury) (HCC)   Acute renal failure (HCC)   CKD (chronic kidney disease) stage 4, GFR 15-29 ml/min (HCC)   Chronic systolic CHF (congestive heart failure) (HCC)   Osteomyelitis Leg wounds/Cellulitis Noticed on foot x-ray. Podiatry consulted on admission with recommendations for ABI and vascular consult. Vancomycin and Cefepime initiated on admission. Unsure of patient's goals of care. Patient is under hospice care but is wanting treatment for leg wounds/infection. Does not want to consider amputation for treatment. No blood cultures obtained on admission. -Continue Vancomycin/Cefepime for now -Goals of care discussions/Palliative care consult -Cardiology consult for recommendations on PAD  AKI on CKD stage IV Baseline creatinine of 2.2. Creatinine of 4.37 on admission. In setting of poor oral intake and dehydration. Trending down. -Continue IV fluids at 50 ml/hr  Hyponatremia Mild. Improved slightly   Diabetes mellitus, type 2 -Continue SSI  Hypoalbuminemia Severe. -Dietitian consult  Hypothyroidism Patient is on Synthroid as an  Outpatient -Continue Synthroid  Chronic systolic heart failure No evidence of exacerbation. EF of 15-20% from 05/2019. She does have significant LE edema but not likely from acute heart failure. Patient is is on Coreg, torsemide as an outpatient which are held secondary to  hypotension.  Primary hypertension Patient is on Coreg, hydralazine, Imdur as an outpatient. -Discontinue Imdur  PAD Patient follows with cardiology as an outpatient. Patient has had several intervention performed and follows with Dr. Gwenlyn Found  History of rectal cancer S/p neoadjuvant chemo and radiation. 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  History of DVT Discontinue Eliquis and start heparin IV   DVT prophylaxis: Heparin IV Code Status:   Code Status: DNR Family Communication: None at bedside Disposition Plan: Discharge pending management of of LE wounds and goals of care   Consultants:   Podiatry  Palliative care medicine  Vascular surgery  Cardiology  Procedures:   ABIs  Antimicrobials:  Vancomycin  Cefepime    Subjective: Leg pain  Objective: Vitals:   02/27/20 0347 02/27/20 1401 02/27/20 1950 02/28/20 0400  BP: (!) 107/52 (!) 87/57 (!) 102/56 (!) 100/54  Pulse: 72 72 73 73  Resp: 18 17 17 17   Temp: 98 F (36.7 C) (!) 97.4 F (36.3 C) 97.9 F (36.6 C) 98 F (36.7 C)  TempSrc:  Oral Oral Oral  SpO2: 98% 97% 98% 97%  Weight:      Height:        Intake/Output Summary (Last 24 hours) at 02/28/2020 0914 Last data filed at 02/28/2020 0830 Gross per 24 hour  Intake 900 ml  Output 403 ml  Net 497 ml   Filed Weights   02/25/20 1700  Weight: 74.8 kg    Examination:  General exam: Appears calm and comfortable Respiratory system: Clear to auscultation. Respiratory effort normal. Cardiovascular system: S1 & S2 heard, RRR. No murmurs, rubs, gallops or clicks. Gastrointestinal system: Abdomen is nondistended, soft and nontender. No organomegaly or  masses felt. Normal bowel sounds heard. Central nervous system: Alert and oriented. No focal neurological deficits. Musculoskeletal: No edema. No calf tenderness Skin: No cyanosis. Severe leg wounds/ulcers Psychiatry: Judgement and insight appear normal. Mood & affect appropriate.     Data Reviewed: I have personally reviewed following labs and imaging studies  CBC Lab Results  Component Value Date   WBC 6.8 02/28/2020   RBC 2.68 (L) 02/28/2020   HGB 8.9 (L) 02/28/2020   HCT 26.6 (L) 02/28/2020   MCV 99.3 02/28/2020   MCH 33.2 02/28/2020   PLT 489 (H) 02/28/2020   MCHC 33.5 02/28/2020   RDW 13.9 02/28/2020   LYMPHSABS 0.4 (L) 02/25/2020   MONOABS 0.4 02/25/2020   EOSABS 0.0 02/25/2020   BASOSABS 0.0 41/32/4401     Last metabolic panel Lab Results  Component Value Date   NA 137 02/28/2020   K 3.0 (L) 02/28/2020   CL 99 02/28/2020   CO2 23 02/28/2020   BUN 64 (H) 02/28/2020   CREATININE 3.42 (H) 02/28/2020   GLUCOSE 93 02/28/2020   GFRNONAA 14 (L) 02/28/2020   GFRAA 25 (L) 07/04/2019   CALCIUM 7.3 (L) 02/28/2020   PHOS 4.0 02/28/2020   PROT 6.2 (L) 02/25/2020   ALBUMIN 1.3 (L) 02/28/2020   LABGLOB 2.9 08/10/2017   AGRATIO 1.4 08/10/2017   BILITOT 0.7 02/25/2020   ALKPHOS 120 02/25/2020   AST 15 02/25/2020   ALT 7 02/25/2020   ANIONGAP 15 02/28/2020    CBG (last 3)  Recent Labs    02/27/20 1634 02/27/20 2041 02/28/20 0749  GLUCAP 98 85 75     GFR: Estimated Creatinine Clearance: 15.4 mL/min (A) (by C-G formula based on SCr of 3.42 mg/dL (H)).  Coagulation Profile: No results for input(s): INR, PROTIME in the last 168 hours.  Recent Results (from the past 240 hour(s))  SARS CORONAVIRUS 2 (TAT 6-24 HRS) Nasopharyngeal Nasopharyngeal Swab     Status: None   Collection Time: 02/25/20  5:07 PM   Specimen: Nasopharyngeal Swab  Result Value Ref Range Status   SARS Coronavirus 2 NEGATIVE NEGATIVE Final    Comment: (NOTE) SARS-CoV-2 target nucleic acids are NOT DETECTED.  The SARS-CoV-2 RNA is generally detectable in upper and lower respiratory specimens during the acute phase of infection. Negative results do not preclude SARS-CoV-2 infection, do not rule out co-infections with other pathogens, and should not be used as the sole  basis for treatment or other patient management decisions. Negative results must be combined with clinical observations, patient history, and epidemiological information. The expected result is Negative.  Fact Sheet for Patients: SugarRoll.be  Fact Sheet for Healthcare Providers: https://www.woods-mathews.com/  This test is not yet approved or cleared by the [place] FDA and  has been authorized for detection and/or diagnosis of SARS-CoV-2 by FDA under an Emergency Use Authorization (EUA). This EUA will remain  in effect (meaning this test can be used) for the duration of the COVID-19 declaration under Se ction 564(b)(1) of the Act, 21 U.S.C. section 360bbb-3(b)(1), unless the authorization is terminated or revoked sooner.  Performed at [facility], [place] [address] [place], [place] [address]         Radiology Studies: VAS [place] ABI WITH/WO TBI  Result Date: 02/26/2020 LOWER EXTREMITY DOPPLER STUDY Indications: Ischemic ulcers of bilateral lower extremities. Known history of              PVD.  Vascular Interventions: 05-08-2017 Stenting of RT external iliac artery and  12-2015 stenting of RT SFA. Limitations: Today's exam was limited due to multiple open wounds. Comparison       03-25-2019 Prior ABI w/ TBI RT ABI/TBI was 0.5/0.25 LT was Study:           0.49/0 Performing Technologist: Darlin Coco RDMS  Examination Guidelines: A complete evaluation includes at minimum, Doppler waveform signals and systolic blood pressure reading at the level of bilateral brachial, anterior tibial, and posterior tibial arteries, when vessel segments are accessible. Bilateral testing is considered an integral part of a complete examination. Photoelectric Plethysmograph (PPG) waveforms and toe systolic pressure readings are included as required and additional duplex testing as needed. Limited examinations for reoccurring indications  may be performed as noted.  ABI Findings: +---------+------------------+-----+-------------------+-----------------------+ Right    Rt Pressure (mmHg)IndexWaveform           Comment                 +---------+------------------+-----+-------------------+-----------------------+ Brachial 110                    triphasic                                  +---------+------------------+-----+-------------------+-----------------------+ PTA      49                0.45 dampened monophasic                        +---------+------------------+-----+-------------------+-----------------------+ DP       47                0.43 dampened monophasic                        +---------+------------------+-----+-------------------+-----------------------+ Great Toe                                          Unable to assess due to                                                    toe wounds.             +---------+------------------+-----+-------------------+-----------------------+ +---------+------------------+-----+-------------------+-----------------------+ Left     Lt Pressure (mmHg)IndexWaveform           Comment                 +---------+------------------+-----+-------------------+-----------------------+ Brachial                        monophasic         Unable to take pressure                                                    per patient.            +---------+------------------+-----+-------------------+-----------------------+ PTA      44                0.40 dampened monophasic                        +---------+------------------+-----+-------------------+-----------------------+  DP       34                0.31 dampened monophasic                        +---------+------------------+-----+-------------------+-----------------------+ Great Toe                                          Unable to assess due to                                                     toe wounds.             +---------+------------------+-----+-------------------+-----------------------+ +-------+-----------+-----------+------------+------------+ ABI/TBIToday's ABIToday's TBIPrevious ABIPrevious TBI +-------+-----------+-----------+------------+------------+ Right  0.45                  0.50        0.25         +-------+-----------+-----------+------------+------------+ Left   0.40                  0.49        0            +-------+-----------+-----------+------------+------------+ Right ABIs appear decreased compared to prior study on 03-25-2019. Left ABIs appear decreased compared to prior study on 03-25-2019.  Summary: Right: Resting right ankle-brachial index indicates severe right lower extremity arterial disease. Unable to assess TBI secondary to extensive toe wounds. Left: Resting left ankle-brachial index indicates severe left lower extremity arterial disease. Unable to assess TBI secondary to extensive toe wounds.  *See table(s) above for measurements and observations.  Electronically signed by Harold Barban MD on 02/26/2020 at 9:46:35 PM.    Final         Scheduled Meds: . aspirin EC  81 mg Oral Daily  . atorvastatin  10 mg Oral QHS  . Chlorhexidine Gluconate Cloth  6 each Topical Daily  . dronabinol  2.5 mg Oral BID AC  . feeding supplement  237 mL Oral BID BM  . gabapentin  300 mg Oral BID  . insulin aspart  0-9 Units Subcutaneous TID WC  . levothyroxine  50 mcg Oral Daily  . loratadine  10 mg Oral Daily  . multivitamin with minerals  1 tablet Oral Daily  . pantoprazole  40 mg Oral QAC breakfast  . sodium bicarbonate  650 mg Oral TID   Continuous Infusions: . sodium chloride 50 mL/hr at 02/28/20 0818  . ceFEPime (MAXIPIME) IV 2 g (02/27/20 2139)  . heparin 800 Units/hr (02/27/20 2232)  . vancomycin 750 mg (02/27/20 1543)     LOS: 3 days     Cordelia Poche, MD [facility] 02/28/2020, 9:14 AM  If 7PM-7AM, please  contact night-coverage [email or web address]

## 2020-02-28 NOTE — TOC Progression Note (Signed)
Transition of Care [facility]) - Progression Note    Patient Details  Name: Nicole Sellers MRN: [id number] Date of Birth: [birth year]  Transition of Care [facility]) CM/SW [place], [place] Phone Number: 02/28/2020, 11:24 AM  Clinical Narrative:     TOC following, family unsure of remaining hospice or pursuing treatment and SNF.   Expected Discharge Plan: [place] Barriers to Discharge: Continued Medical Work up,Insurance Authorization  Expected Discharge Plan and Services Expected Discharge Plan: [place] arrangements for the past 2 months: [place] Determinants of Health (SDOH) Interventions    Readmission Risk Interventions Readmission Risk Prevention Plan 06/28/2019 05/22/2019 05/22/2019  Transportation Screening - - Complete  Medication Review Press photographer) Complete - Complete  PCP or Specialist appointment within 3-5 days of discharge - - Complete  PCP/Specialist Appt Not Complete comments - - -  [place] or [place] - - Complete  SW Recovery Care/Counseling Consult - - Complete  SW Consult Not Complete Comments - - -  [place] - Complete Not [place] - - Not Applicable  Some recent data might be hidden   Emeterio Reeve, Latanya Presser, [place] Social Worker [phone]

## 2020-02-28 NOTE — Progress Notes (Signed)
ANTICOAGULATION CONSULT NOTE  Pharmacy Consult:  Heparin Indication:  History of DVT  Allergies  Allergen Reactions  . Kiwi Extract Itching and Other (See Comments)    Throat itching   . Lisinopril Other (See Comments)    Bruising   . Plavix [Clopidogrel] Other (See Comments)    "Made me feel badly- didn't agree with my system"    Patient Measurements: Height: 5\' 6"  (167.6 cm) Weight: 74.8 kg (165 lb) IBW/kg (Calculated) : 59.3 Heparin Dosing Weight: 74 kg  Vital Signs: Temp: 98 F (36.7 C) (02/18 0400) Temp Source: Oral (02/18 0400) BP: 100/54 (02/18 0400) Pulse Rate: 73 (02/18 0400)  Labs: Recent Labs    02/26/20 0431 02/27/20 0925 02/27/20 2100 02/28/20 0425 02/28/20 1052  HGB 8.9* 8.3*  --  8.9*  --   HCT 28.5* 26.4*  --  26.6*  --   PLT 435* 457*  --  489*  --   APTT  --   --  136* 97* 81*  HEPARINUNFRC  --   --  >2.20* >2.20*  --   CREATININE 4.13* 3.66*  --  3.42*  --     Estimated Creatinine Clearance: 15.4 mL/min (A) (by C-G formula based on SCr of 3.42 mg/dL (H)).   Assessment: 71 YOF presented with food pain, found to have critical limb ischemia with osteomyelitis of the toes and heel wound.  Patient has a history of DVT on Eliquis.  Pharmacy consulted to transition to IV heparin while Eliquis on hold, last dose on 02/26/20 PM.  Will use aPTT to guide heparin dosing until heparin level and aPTT correlate.   APTT therapeutic; no bleeding reported.  Goal of Therapy:  Heparin level 0.3-0.7 units/ml aPTT 66-102 seconds Monitor platelets by anticoagulation protocol: Yes   Plan:  Continue heparin infusion at 800 units/hr Daily heparin level, aPTT and CBC  Dahiana Kulak D. Mina Marble, PharmD, BCPS, [place] 02/28/2020, 12:59 PM

## 2020-02-29 DIAGNOSIS — I739 Peripheral vascular disease, unspecified: Secondary | ICD-10-CM | POA: Diagnosis not present

## 2020-02-29 DIAGNOSIS — M869 Osteomyelitis, unspecified: Secondary | ICD-10-CM | POA: Diagnosis not present

## 2020-02-29 LAB — GLUCOSE, CAPILLARY
Glucose-Capillary: 122 mg/dL — ABNORMAL HIGH (ref 70–99)
Glucose-Capillary: 122 mg/dL — ABNORMAL HIGH (ref 70–99)
Glucose-Capillary: 180 mg/dL — ABNORMAL HIGH (ref 70–99)
Glucose-Capillary: 139 mg/dL — ABNORMAL HIGH (ref 70–99)

## 2020-02-29 LAB — CBC
HCT: 26.4 % — ABNORMAL LOW (ref 36.0–46.0)
Hemoglobin: 8.6 g/dL — ABNORMAL LOW (ref 12.0–15.0)
MCH: 32.3 pg (ref 26.0–34.0)
MCHC: 32.6 g/dL (ref 30.0–36.0)
MCV: 99.2 fL (ref 80.0–100.0)
Platelets: 495 10*3/uL — ABNORMAL HIGH (ref 150–400)
RBC: 2.66 MIL/uL — ABNORMAL LOW (ref 3.87–5.11)
RDW: 13.9 % (ref 11.5–15.5)
WBC: 6.8 10*3/uL (ref 4.0–10.5)
nRBC: 0 % (ref 0.0–0.2)

## 2020-02-29 LAB — BASIC METABOLIC PANEL
Anion gap: 15 (ref 5–15)
BUN: 63 mg/dL — ABNORMAL HIGH (ref 8–23)
CO2: 20 mmol/L — ABNORMAL LOW (ref 22–32)
Calcium: 7.9 mg/dL — ABNORMAL LOW (ref 8.9–10.3)
Chloride: 105 mmol/L (ref 98–111)
Creatinine, Ser: 2.98 mg/dL — ABNORMAL HIGH (ref 0.44–1.00)
GFR, Estimated: 16 mL/min — ABNORMAL LOW (ref >60)
Glucose, Bld: 194 mg/dL — ABNORMAL HIGH (ref 70–99)
Potassium: 3.6 mmol/L (ref 3.5–5.1)
Sodium: 140 mmol/L (ref 135–145)

## 2020-02-29 LAB — HEPARIN LEVEL (UNFRACTIONATED): Heparin Unfractionated: 1.14 IU/mL — ABNORMAL HIGH (ref 0.30–0.70)

## 2020-02-29 LAB — APTT: aPTT: 77 seconds — ABNORMAL HIGH (ref 24–36)

## 2020-02-29 NOTE — Consult Note (Addendum)
[facility]                                                                                        Patient Identification: Patient Name: Nicole Sellers MRN: [id number] Admit Date: 02/25/2020  4:53 PM Today's Date: 02/29/2020 Reason for consult: Bilateral LE wounds  Requesting provider: Cordelia Poche   Principal Problem:   Osteomyelitis [facility]) Active Problems:   Essential hypertension   Non-insulin treated type 2 diabetes mellitus ([place])   PAD (peripheral artery disease) Rt ABI 0.7, Lt ABI 0.46   AKI (acute kidney injury) ([place])   Acute renal failure (HCC)   CKD (chronic kidney disease) stage 4, GFR 15-29 ml/min (HCC)   Chronic systolic CHF (congestive heart failure) (HCC)   Antibiotics: Vancomycin 2/15-                    Cefepime 2/15-c  Lines/Tubes: Rt chest implanted port   Assessment Ischemic wounds in the bilateral Lower extremity in a vasculopath  Xray showing Osteomyelitis of bilateral Foot ( rt first distal phalanx and left second digit) Severe PAD s/p multiple surgical intervention  CAD CKD DM Rectal ca s/p chemo and resection with an ostomy   Recommendations  -Her presentation is not concerning for sepsis. Although Xray of her bilateral foot shows some evidence of Osteomyelitis, given severity of her PAD, it is less likely that antibiotics are going to heal her wounds given poor circulation. Hence, I will not recommend any antibiotics unless she is septic ( which she s clearly not at this time). I have discussed this with the patient.  -Recommend to consult surgery for possible need of surgical intervention ? Possibly amputation -Palliative care for [place] discussion -continue wound care -Call us back with questions or concerns   Rest of the management as per the primary team.  Thank you for the  consult ___________________________________________________________________________________________________ HPI and Hospital Course: Nicole Sellers is a 70 y.o. female with a hx of severe PAD s/p PTA and stenting of right superficial femoral artery in 2014 for an ischemic toe s/p second right toe amputation and PTA and stenting of right external iliac artery in 04/2017, bilateral carotid artery disease with 60-79% stenosis of left ICA and 1-39% stenosis of right ICA, DVT in 11/2018 on [place], hypertension, hyperlipidemia, DM2 neuropathy, CKD stage IV-V, hypothyroidism, anemia, and stage III rectal cancer diagnosed in 2019 s/p chemo and resection who  presented to the ED on 2/15 for worsening bilateral LE wounds. History is mostly obtained from chart review as patient is a poor historian and answers with " yes or no".   She has had increasing swelling and drainage from her bilateral leg wounds that has worsened gradually over last few weeks and started having foul smell leading to this admission. She also has become more weaker and weaker. She has a hospice nurse coming to the house to change her dressings.  She had no fevers chills coughing or shortness of breath. No h/o N/V/abdominal pain and diarrhea. Denies any pain/tenderness or swelling at the port site.   ROS: patient unwilling to talk and could  not be obtained   Past Medical History:  Diagnosis Date  . Age-related nuclear cataract, right eye   . Allergy   . Anemia   . Arthritis    "joints sometimes" (12/25/2012)  . CHF (congestive heart failure) ([place])   . Chronic kidney disease    stage 4  . Clotting disorder ([place])   . Colon cancer ([place])   . Critical lower limb ischemia   . Deep vein thrombosis (DVT) ([place])   . Diverticulosis   . Gangrene of toe, Rt second toe 12/25/2012  . GERD (gastroesophageal reflux disease)   . High cholesterol   . Hypertension   . Hypothyroidism   . PAD (peripheral artery disease) ([place])   . PAD  (peripheral artery disease) ([place])   . Peripheral neuropathy   . Rectal cancer ([place])   . S/P arterial stent, 12/25/13, successful diamondback orbital rotational arthrectomy, PTA using chocolate  balloon and stenting using I DEV stent of long segment calcified high-grade proximal and mid r 12/25/2012  . Tobacco abuse   . Type II diabetes mellitus ([place])    Past Surgical History:  Procedure Laterality Date  . ABDOMINAL AORTAGRAM  12/20/2012   Procedure: ABDOMINAL AORTAGRAM;  Surgeon: Lorretta Harp, MD;  Location: [facility] CATH LAB;  Service: Cardiovascular;;  . ABDOMINAL HYSTERECTOMY  2000  . ANGIOPLASTY / STENTING FEMORAL Right 12/25/2012  . ATHERECTOMY Right 12/25/2012   Procedure: ATHERECTOMY;  Surgeon: Lorretta Harp, MD;  Location: [facility] CATH LAB;  Service: Cardiovascular;  Laterality: Right;  right SFA  . COLONOSCOPY    . DIVERTING ILEOSTOMY N/A 08/03/2018   Procedure: DIVERTING LOOP ILEOSTOMY;  Surgeon: Leighton Ruff, MD;  Location: [facility] ORS;  Service: General;  Laterality: N/A;  . ESOPHAGOGASTRODUODENOSCOPY N/A 10/04/2018   Procedure: ESOPHAGOGASTRODUODENOSCOPY (EGD);  Surgeon: Carol Ada, MD;  Location: [facility] ENDOSCOPY;  Service: Endoscopy;  Laterality: N/A;  . EYE SURGERY Left    cataract  . HERNIA REPAIR    . IR THORACENTESIS ASP PLEURAL SPACE W/IMG GUIDE  07/02/2019  . IR THORACENTESIS ASP PLEURAL SPACE W/IMG GUIDE  07/03/2019  . LOWER EXTREMITY ANGIOGRAM N/A 12/20/2012   Procedure: LOWER EXTREMITY ANGIOGRAM;  Surgeon: Lorretta Harp, MD;  Location: [facility] CATH LAB;  Service: Cardiovascular;  Laterality: N/A;  . LOWER EXTREMITY ANGIOGRAM N/A 12/25/2012   Procedure: LOWER EXTREMITY ANGIOGRAM;  Surgeon: Lorretta Harp, MD;  Location: [facility] CATH LAB;  Service: Cardiovascular;  Laterality: N/A;  . LOWER EXTREMITY INTERVENTION  05/08/2017  . LOWER EXTREMITY INTERVENTION Bilateral 05/08/2017   Procedure: LOWER EXTREMITY INTERVENTION;  Surgeon: Lorretta Harp, MD;  Location: [facility] CV LAB;   Service: Cardiovascular;  Laterality: Bilateral;  . PERIPHERAL VASCULAR BALLOON ANGIOPLASTY Right 05/08/2017   Procedure: PERIPHERAL VASCULAR BALLOON ANGIOPLASTY;  Surgeon: Lorretta Harp, MD;  Location: [facility] CV LAB;  Service: Cardiovascular;  Laterality: Right;  sfa  . PERIPHERAL VASCULAR INTERVENTION Right 05/08/2017   Procedure: PERIPHERAL VASCULAR INTERVENTION;  Surgeon: Lorretta Harp, MD;  Location: [facility] CV LAB;  Service: Cardiovascular;  Laterality: Right;  ext iliac  . PORTACATH PLACEMENT Right 11/22/2017   Procedure: INSERTION PORT-A-CATH;  Surgeon: Leighton Ruff, MD;  Location: [facility] ORS;  Service: General;  Laterality: Right;  . SAVORY DILATION N/A 10/04/2018   Procedure: SAVORY DILATION;  Surgeon: Carol Ada, MD;  Location: [facility] ENDOSCOPY;  Service: Endoscopy;  Laterality: N/A;  . TOE SURGERY Right    2n toe   . UMBILICAL HERNIA REPAIR  2000  . UMBILICAL HERNIA  REPAIR N/A 08/03/2018   Procedure: UMBILICAL HERNIA REPAIR;  Surgeon: Leighton Ruff, MD;  Location: [facility] ORS;  Service: General;  Laterality: N/A;  . XI ROBOTIC ASSISTED LOWER ANTERIOR RESECTION N/A 08/03/2018   Procedure: XI ROBOTIC ASSISTED LOWER ANTERIOR RESECTION; DIAGNOSTIC FLEXIBLE SIGMOIDOSCOPY; INTRAOPERATIVE ASSESSMENT OF VASCULAR PERFUSION;  Surgeon: Leighton Ruff, MD;  Location: [facility] OR[facility];  Service: General;  Laterality: N/A;     Scheduled Meds: . (feeding supplement) PROSource Plus  30 mL Oral TID BM  . aspirin EC  81 mg Oral Daily  . atorvastatin  10 mg Oral QHS  . Chlorhexidine Gluconate Cloth  6 each Topical Daily  . dronabinol  2.5 mg Oral BID AC  . feeding supplement  237 mL Oral BID BM  . gabapentin  300 mg Oral BID  . insulin aspart  0-9 Units Subcutaneous TID WC  . levothyroxine  50 mcg Oral Daily  . loratadine  10 mg Oral Daily  . multivitamin with minerals  1 tablet Oral Daily  . pantoprazole  40 mg Oral QAC breakfast  . sodium bicarbonate  650 mg Oral TID   Continuous  Infusions: . sodium chloride 50 mL/hr at 02/29/20 0508  . ceFEPime (MAXIPIME) IV 2 g (02/28/20 2120)  . heparin 800 Units/hr (02/27/20 2232)  . vancomycin 750 mg (02/27/20 1543)   PRN Meds:.acetaminophen **OR** acetaminophen, albuterol, hydrALAZINE, HYDROmorphone (DILAUDID) injection, oxyCODONE-acetaminophen, sodium chloride flush  Allergies  Allergen Reactions  . Kiwi Extract Itching and Other (See Comments)    Throat itching   . Lisinopril Other (See Comments)    Bruising   . Plavix [Clopidogrel] Other (See Comments)    "Made me feel badly- didn't agree with my system"   Social History   Socioeconomic History  . Marital status: Single    Spouse name: Not on file  . Number of children: 1  . Years of education: Not on file  . Highest education level: Not on file  Occupational History  . Occupation: CNA    Comment: Retired   Tobacco Use  . Smoking status: Former Smoker    Packs/day: 0.50    Years: 48.00    Pack years: 24.00    Types: Cigarettes  . Smokeless tobacco: Never Used  . Tobacco comment: quit in 2016  Vaping Use  . Vaping Use: Never used  Substance and Sexual Activity  . Alcohol use: No    Alcohol/week: 0.0 standard drinks  . Drug use: No  . Sexual activity: Not Currently  Other Topics Concern  . Not on file  Social History Narrative  . Not on file   Social Determinants of Health   Financial Resource Strain: Not on file  Food Insecurity: Not on file  Transportation Needs: Not on file  Physical Activity: Not on file  Stress: Not on file  Social Connections: Not on file  Intimate Partner Violence: Not on file    Vitals BP 115/70 (BP Location: Left Arm)   Pulse (!) 108   Temp 98.7 F (37.1 C) (Oral)   Resp 20   Ht 5\' 6"  (1.676 m)   Wt 74.8 kg   SpO2 98%   BMI 26.63 kg/m    Physical Exam General - elderly female lying in bed, appears to be comfortable, does not respond to questions much HEENT - WNL, pale conjunctiva Mucosa moist Chest -  clear CVS- Normal s1s2. RRR Abdomen - soft, has a RUQ ileostomy with liquid brown stool Extremities -  Pertinent Microbiology Results for orders placed or performed during the hospital encounter of 02/25/20  SARS CORONAVIRUS 2 (TAT 6-24 HRS) Nasopharyngeal Nasopharyngeal Swab     Status: None   Collection Time: 02/25/20  5:07 PM   Specimen: Nasopharyngeal Swab  Result Value Ref Range Status   SARS Coronavirus 2 NEGATIVE NEGATIVE Final    Comment: (NOTE) SARS-CoV-2 target nucleic acids are NOT DETECTED.  The SARS-CoV-2 RNA is generally detectable in upper and lower respiratory specimens during the acute phase of infection. Negative results do not preclude SARS-CoV-2 infection, do not rule out co-infections with other pathogens, and should not be used as the sole basis for treatment or other patient management decisions. Negative results must be combined with clinical observations, patient history, and epidemiological information. The expected result is Negative.  Fact Sheet for Patients: SugarRoll.be  Fact Sheet for Healthcare Providers: https://www.woods-mathews.com/  This test is not yet approved or cleared by the [place] FDA and  has been authorized for detection and/or diagnosis of SARS-CoV-2 by FDA under an Emergency Use Authorization (EUA). This EUA will remain  in effect (meaning this test can be used) for the duration of the COVID-19 declaration under Se ction 564(b)(1) of the Act, 21 U.S.C. section 360bbb-3(b)(1), unless the authorization is terminated or revoked sooner.  Performed at [facility], [place] [address], [place], [place] [address]    Pertinent Lab seen by me: CBC Latest Ref Rng & Units 02/29/2020 02/28/2020 02/27/2020  WBC 4.0 - 10.5 K/uL 6.8 6.8 7.4  Hemoglobin 12.0 - 15.0 g/dL 8.6(L) 8.9(L) 8.3(L)  Hematocrit 36.0 - 46.0 % 26.4(L) 26.6(L) 26.4(L)  Platelets 150 - 400 K/uL 495(H) 489(H)  457(H)   CMP Latest Ref Rng & Units 02/28/2020 02/27/2020 02/26/2020  Glucose 70 - 99 mg/dL 93 112(H) 151(H)  BUN 8 - 23 mg/dL 64(H) 67(H) 67(H)  Creatinine 0.44 - 1.00 mg/dL 3.42(H) 3.66(H) 4.13(H)  Sodium 135 - 145 mmol/L 137 134(L) 132(L)  Potassium 3.5 - 5.1 mmol/L 3.0(L) 3.5 3.6  Chloride 98 - 111 mmol/L 99 100 95(L)  CO2 22 - 32 mmol/L 23 24 24   Calcium 8.9 - 10.3 mg/dL 7.3(L) 7.3(L) 7.3(L)  Total Protein 6.5 - 8.1 g/dL - - -  Total Bilirubin 0.3 - 1.2 mg/dL - - -  Alkaline Phos 38 - 126 U/L - - -  AST 15 - 41 U/L - - -  ALT 0 - 44 U/L - - -     Pertinent Imagings/Other Imagings Plain films and CT images have been personally visualized and interpreted; radiology reports have been reviewed. Decision making incorporated into the Impression / Recommendations.  Xray rt foot 02/25/20 FINDINGS: Osseous demineralization. Bony destructive change in fragmentation at the first distal phalanx concerning for osteomyelitis. Foreshortened second digit possibly due to prior partial amputation at the level of the D IP joint, correlate with surgical history. No subluxation. No soft tissue emphysema. Ulcer at the tip of the first digit. Probable ulcer plantar surface of the foot at the level of the metatarsal heads and potential ulcer at the heel pad.  IMPRESSION: 1. Bony demineralization with bony destructive change and fragmentation at the first distal phalanx concerning for osteomyelitis.  Xray Left foot 02/25/20 FINDINGS: Bones appear diffusely demineralized. No subluxation. No soft tissue emphysema. Questionable small fracture deformity at the base of the second proximal phalanx on one view. Possible bony resorptive change at the distal phalanx on the lateral view of what appears to be second digit.  IMPRESSION:  1. Possible small fracture deformity at the base of the second proximal phalanx. Correlate for point tenderness. 2. Possible bony resorptive change/osteomyelitis at 1 of  the distal phalanges on lateral view, probably second digit. Consider correlation with MRI if clinically able. 3. Diffuse bone demineralization.  I have spent 60 minutes for this patient encounter including review of prior medical records with greater than 50% of time being face to face and coordination of their care.  Electronically signed by:   Rosiland Oz, MD Infectious Disease Physician [facility] for Infectious Disease Pager: [phone]

## 2020-02-29 NOTE — Progress Notes (Signed)
PROGRESS NOTE    [name]  EQA:[id number] DOB: 03-Jun-1950 DOA: 02/25/2020 PCP: Reynold Bowen, MD   Brief Narrative: Nicole Sellers is a 70 y.o. female with a history of diabetes mellitus, hypertension, CKD stage IV, PVD, bilateral feet/leg wounds, CHF. Patient presented secondary to failure to thrive and worsening LE wounds.   Assessment & Plan:   Principal Problem:   Osteomyelitis ([place]) Active Problems:   Essential hypertension   Non-insulin treated type 2 diabetes mellitus (HCC)   PAD (peripheral artery disease) Rt ABI 0.7, Lt ABI 0.46   AKI (acute kidney injury) (HCC)   Acute renal failure (HCC)   CKD (chronic kidney disease) stage 4, GFR 15-29 ml/min (HCC)   Chronic systolic CHF (congestive heart failure) (HCC)   Osteomyelitis Leg wounds/Cellulitis Noticed on foot x-ray. Podiatry consulted on admission with recommendations for ABI and vascular consult. Vancomycin and Cefepime initiated on admission. Unsure of patient's goals of care. Patient is under hospice care but is wanting treatment for leg wounds/infection. Does not want to consider amputation for treatment. No blood cultures obtained on admission. Antibiotics likely not effective in setting of severe PAD; cardiology recommending no intervention secondary to co-morbidities. -Continue Vancomycin/Cefepime for now -Recommending comfort measures and transfer to hospice facility  AKI on CKD stage IV Baseline creatinine of 2.2. Creatinine of 4.37 on admission. In setting of poor oral intake and dehydration. Trending down. -Continue IV fluids at 50 ml/hr  Hyponatremia Mild. Improved slightly   Diabetes mellitus, type 2 -Continue SSI  Hypoalbuminemia Severe. -Dietitian consult  Hypothyroidism Patient is on Synthroid as an  Outpatient -Continue Synthroid  Chronic systolic heart failure No evidence of exacerbation. EF of 15-20% from 05/2019. She does have significant LE edema but not likely from acute  heart failure. Patient is is on Coreg, torsemide as an outpatient which are held secondary to hypotension.  Primary hypertension Patient is on Coreg, hydralazine, Imdur as an outpatient. -Discontinue Imdur  PAD Patient follows with cardiology as an outpatient. Patient has had several intervention performed and follows with Dr. Gwenlyn Found  History of rectal cancer S/p neoadjuvant chemo and radiation. 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  History of DVT Discontinue Eliquis and start heparin IV   DVT prophylaxis: Heparin IV Code Status:   Code Status: DNR Family Communication: None at bedside. Son on telephone Disposition Plan: Discharge pending management of of LE wounds and goals of care   Consultants:   Podiatry  Palliative care medicine  Vascular surgery  Cardiology  Infectious disease  Procedures:   ABIs  Antimicrobials:  Vancomycin  Cefepime    Subjective: No concerns today per patient.  Objective: Vitals:   02/27/20 1950 02/28/20 0400 02/28/20 1956 02/29/20 0408  BP: (!) 102/56 (!) 100/54 (!) 101/52 115/70  Pulse: 73 73 72 (!) 108  Resp: 17 17 17 20   Temp: 97.9 F (36.6 C) 98 F (36.7 C) 98.3 F (36.8 C) 98.7 F (37.1 C)  TempSrc: Oral Oral Oral Oral  SpO2: 98% 97% 97% 98%  Weight:      Height:        Intake/Output Summary (Last 24 hours) at 02/29/2020 1158 Last data filed at 02/29/2020 0930 Gross per 24 hour  Intake 4000.35 ml  Output 425 ml  Net 3575.35 ml   Filed Weights   02/25/20 1700  Weight: 74.8 kg    Examination:  General exam: Appears calm and comfortable Respiratory system: Clear to auscultation. Respiratory effort normal. Cardiovascular system: S1 &  S2 heard, RRR. No murmurs, rubs, gallops or clicks. Gastrointestinal system: Abdomen is nondistended, soft and nontender. No organomegaly or masses felt. Normal bowel sounds heard. Central nervous system: Somnolent but alert. Seems slightly disoriented.  Oriented to person and place. Musculoskeletal: LE edema. No calf tenderness Psychiatry: Judgement and insight appear normal. Mood & affect appropriate.    Data Reviewed: I have personally reviewed following labs and imaging studies  CBC Lab Results  Component Value Date   WBC 6.8 02/29/2020   RBC 2.66 (L) 02/29/2020   HGB 8.6 (L) 02/29/2020   HCT 26.4 (L) 02/29/2020   MCV 99.2 02/29/2020   MCH 32.3 02/29/2020   PLT 495 (H) 02/29/2020   MCHC 32.6 02/29/2020   RDW 13.9 02/29/2020   LYMPHSABS 0.4 (L) 02/25/2020   MONOABS 0.4 02/25/2020   EOSABS 0.0 02/25/2020   BASOSABS 0.0 62/03/5595     Last metabolic panel Lab Results  Component Value Date   NA 137 02/28/2020   K 3.0 (L) 02/28/2020   CL 99 02/28/2020   CO2 23 02/28/2020   BUN 64 (H) 02/28/2020   CREATININE 3.42 (H) 02/28/2020   GLUCOSE 93 02/28/2020   GFRNONAA 14 (L) 02/28/2020   GFRAA 25 (L) 07/04/2019   CALCIUM 7.3 (L) 02/28/2020   PHOS 4.0 02/28/2020   PROT 6.2 (L) 02/25/2020   ALBUMIN 1.3 (L) 02/28/2020   LABGLOB 2.9 08/10/2017   AGRATIO 1.4 08/10/2017   BILITOT 0.7 02/25/2020   ALKPHOS 120 02/25/2020   AST 15 02/25/2020   ALT 7 02/25/2020   ANIONGAP 15 02/28/2020    CBG (last 3)  Recent Labs    02/28/20 1756 02/28/20 2007 02/29/20 0745  GLUCAP 97 103* 122*     GFR: Estimated Creatinine Clearance: 15.4 mL/min (A) (by C-G formula based on SCr of 3.42 mg/dL (H)).  Coagulation Profile: No results for input(s): INR, PROTIME in the last 168 hours.  Recent Results (from the past 240 hour(s))  SARS CORONAVIRUS 2 (TAT 6-24 HRS) Nasopharyngeal Nasopharyngeal Swab     Status: None   Collection Time: 02/25/20  5:07 PM   Specimen: Nasopharyngeal Swab  Result Value Ref Range Status   SARS Coronavirus 2 NEGATIVE NEGATIVE Final    Comment: (NOTE) SARS-CoV-2 target nucleic acids are NOT DETECTED.  The SARS-CoV-2 RNA is generally detectable in upper and lower respiratory specimens during the acute phase  of infection. Negative results do not preclude SARS-CoV-2 infection, do not rule out co-infections with other pathogens, and should not be used as the sole basis for treatment or other patient management decisions. Negative results must be combined with clinical observations, patient history, and epidemiological information. The expected result is Negative.  Fact Sheet for Patients: SugarRoll.be  Fact Sheet for Healthcare Providers: https://www.woods-mathews.com/  This test is not yet approved or cleared by the [place] FDA and  has been authorized for detection and/or diagnosis of SARS-CoV-2 by FDA under an Emergency Use Authorization (EUA). This EUA will remain  in effect (meaning this test can be used) for the duration of the COVID-19 declaration under Se ction 564(b)(1) of the Act, 21 U.S.C. section 360bbb-3(b)(1), unless the authorization is terminated or revoked sooner.  Performed at [facility], [place] [address] [place], [place] [address]         Radiology Studies: No results found.      Scheduled Meds: . (feeding supplement) PROSource Plus  30 mL Oral TID BM  . aspirin EC  81 mg Oral Daily  .  atorvastatin  10 mg Oral QHS  . Chlorhexidine Gluconate Cloth  6 each Topical Daily  . dronabinol  2.5 mg Oral BID AC  . feeding supplement  237 mL Oral BID BM  . gabapentin  300 mg Oral BID  . insulin aspart  0-9 Units Subcutaneous TID WC  . levothyroxine  50 mcg Oral Daily  . loratadine  10 mg Oral Daily  . multivitamin with minerals  1 tablet Oral Daily  . pantoprazole  40 mg Oral QAC breakfast  . sodium bicarbonate  650 mg Oral TID   Continuous Infusions: . sodium chloride 50 mL/hr at 02/29/20 0508  . ceFEPime (MAXIPIME) IV 2 g (02/28/20 2120)  . heparin 800 Units/hr (02/27/20 2232)  . vancomycin 750 mg (02/27/20 1543)     LOS: 4 days     Cordelia Poche, MD [facility] 02/29/2020, 11:58 AM  If  7PM-7AM, please contact night-coverage [email or web address]

## 2020-02-29 NOTE — Progress Notes (Signed)
ANTICOAGULATION CONSULT NOTE  Pharmacy Consult:  Heparin Indication:  History of DVT  Allergies  Allergen Reactions  . Kiwi Extract Itching and Other (See Comments)    Throat itching   . Lisinopril Other (See Comments)    Bruising   . Plavix [Clopidogrel] Other (See Comments)    "Made me feel badly- didn't agree with my system"    Patient Measurements: Height: 5\' 6"  (167.6 cm) Weight: 74.8 kg (165 lb) IBW/kg (Calculated) : 59.3 Heparin Dosing Weight: 74 kg  Vital Signs: Temp: 98.7 F (37.1 C) (02/19 0408) Temp Source: Oral (02/19 0408) BP: 115/70 (02/19 0408) Pulse Rate: 108 (02/19 0408)  Labs: Recent Labs    02/27/20 0925 02/27/20 0925 02/27/20 2100 02/28/20 0425 02/28/20 1052 02/29/20 0339  HGB 8.3*  --   --  8.9*  --  8.6*  HCT 26.4*  --   --  26.6*  --  26.4*  PLT 457*  --   --  489*  --  495*  APTT  --    < > 136* 97* 81* 77*  HEPARINUNFRC  --   --  >2.20* >2.20*  --  1.14*  CREATININE 3.66*  --   --  3.42*  --   --    < > = values in this interval not displayed.    Estimated Creatinine Clearance: 15.4 mL/min (A) (by C-G formula based on SCr of 3.42 mg/dL (H)).   Assessment: 70 year old female presented with foot pain, found to have critical limb ischemia with osteomyelitis of the toes and heel wound.  Patient has a history of DVT on Eliquis.  Pharmacy consulted to transition to IV heparin while Eliquis on hold, last dose on 02/26/20 PM.  Will use aPTT to guide heparin dosing until heparin level and aPTT correlate.   APTT therapeutic; no bleeding reported.  Goal of Therapy:  Heparin level 0.3-0.7 units/ml aPTT 66-102 seconds Monitor platelets by anticoagulation protocol: Yes   Plan:  - Continue heparin infusion at 800 units/hr - Daily heparin level, aPTT and CBC - Follow-up plans to transition back to Eliquis     Preciliano Castell L. Devin Going, PharmD, [place] PGY2 Pharmacy Resident Weekends 7:00 am - 3:00 pm, please call [phone] 02/29/20      8:02  AM  Please check AMION for all [place] phone numbers After 10:00 PM, call the [place] [phone]

## 2020-02-29 NOTE — Progress Notes (Incomplete)
Hr 120

## 2020-02-29 NOTE — Progress Notes (Incomplete)
Palliative:  HPI: 70 y.o. female  with past medical history of CHF, s/p [employer] PPM/ICD, CAD, IDDM, CKD stage 3b, prostatomegaly admitted on 06/09/2021 with weakness x 3 days after fall followed with pain, difficulty to ambulate, decrease intake and urine output. Noted to have hypotension, leukocytosis related to discitis aspiration showing +Klebsiella along with UTI. Hospitalization complicated by cardiogenic shock EF <20%, grade 2 diastolic dysfunction, moderate pulmonary hypertension, renal failure, bladder outlet obstruction, paroxysmal atrial fibrillation. Ongoing significant fluid overload and poor response to diuresis. Trial large dose of Lasix while on dobutamine.   I met again today with Mr. Jeffries along with his daughter and wife at bedside. Mr. Jeffries continues to speak to his poor prognosis and is able to tell his family that he is very much at peace. He reviews that his vital signs have been good but he understands that his heart is "give out" - he says that his heart has served him well for many good years. He is a very spiritual man and reports that he had wonderful visits with chaplain Ellen. He is at peace and is prepared for transition to comfort care when his other daughter returns from [place]. Family at bedside report that they believed she would be back Thurs/Fri but she will not return until Monday July 3rd. Mr. Jeffries was disheartened that she does not return until Monday but they all continue to report their goal to continue to maintain until she returns and then we will transition to full comfort care and allow natural and peaceful death.   I spoke with them again about my recommendation for deactivation of AICD and they all agree that this would be in his best interest at this time. I explained the process and will place order for [employer] to come to deactivate.   All questions/concerns addressed. Emotional support provided.   Exam: Alert, oriented. Frail.  Lying in bed. No distress. Generalized weakness and fatigue. HR 90-100s. Breathing regular, unlabored at rest. Abd soft. BLE edema.   Plan: - DNR - Deactivate AICD - Awaiting daughter's return from vacation to say goodbye and then will transition to full comfort care (she should be back Monday)  40 min  Jhair Witherington, NP Palliative Medicine Team Pager [phone] (Please see [email or web address] for schedule) Team Phone [phone]    Greater than 50%  of this time was spent counseling and coordinating care related to the above assessment and plan   

## 2020-03-01 LAB — CBC
HCT: 28.2 % — ABNORMAL LOW (ref 36.0–46.0)
Hemoglobin: 8.8 g/dL — ABNORMAL LOW (ref 12.0–15.0)
MCH: 31.8 pg (ref 26.0–34.0)
MCHC: 31.2 g/dL (ref 30.0–36.0)
MCV: 101.8 fL — ABNORMAL HIGH (ref 80.0–100.0)
Platelets: 499 10*3/uL — ABNORMAL HIGH (ref 150–400)
RBC: 2.77 MIL/uL — ABNORMAL LOW (ref 3.87–5.11)
RDW: 14 % (ref 11.5–15.5)
WBC: 8.3 10*3/uL (ref 4.0–10.5)
nRBC: 0 % (ref 0.0–0.2)

## 2020-03-01 LAB — GLUCOSE, CAPILLARY
Glucose-Capillary: 146 mg/dL — ABNORMAL HIGH (ref 70–99)
Glucose-Capillary: 135 mg/dL — ABNORMAL HIGH (ref 70–99)
Glucose-Capillary: 138 mg/dL — ABNORMAL HIGH (ref 70–99)
Glucose-Capillary: 155 mg/dL — ABNORMAL HIGH (ref 70–99)

## 2020-03-01 LAB — LACTIC ACID, PLASMA: Lactic Acid, Venous: 1.7 mmol/L (ref 0.5–1.9)

## 2020-03-01 LAB — HEPARIN LEVEL (UNFRACTIONATED): Heparin Unfractionated: 0.91 IU/mL — ABNORMAL HIGH (ref 0.30–0.70)

## 2020-03-01 LAB — APTT: aPTT: 97 seconds — ABNORMAL HIGH (ref 24–36)

## 2020-03-01 MED ORDER — VANCOMYCIN HCL 1000 MG/200ML IV SOLN
1000.0000 mg | INTRAVENOUS | Status: DC
  Filled 2020-03-01: qty 200

## 2020-03-01 NOTE — Progress Notes (Signed)
ANTICOAGULATION CONSULT NOTE  Pharmacy Consult:  Heparin Indication:  History of DVT  Allergies  Allergen Reactions  . Kiwi Extract Itching and Other (See Comments)    Throat itching   . Lisinopril Other (See Comments)    Bruising   . Plavix [Clopidogrel] Other (See Comments)    "Made me feel badly- didn't agree with my system"    Patient Measurements: Height: 5\' 6"  (167.6 cm) Weight: 74.8 kg (165 lb) IBW/kg (Calculated) : 59.3 Heparin Dosing Weight: 74 kg  Vital Signs: Temp: 97.6 F (36.4 C) (02/20 0634) Temp Source: Oral (02/20 0252) BP: 114/78 (02/20 0634) Pulse Rate: 98 (02/20 0634)  Labs: Recent Labs    02/27/20 0925 02/27/20 2100 02/28/20 0425 02/28/20 1052 02/29/20 0339 02/29/20 1454 03/01/20 0331  HGB 8.3*  --  8.9*  --  8.6*  --  8.8*  HCT 26.4*  --  26.6*  --  26.4*  --  28.2*  PLT 457*  --  489*  --  495*  --  499*  APTT  --    < > 97* 81* 77*  --  97*  HEPARINUNFRC  --    < > >2.20*  --  1.14*  --  0.91*  CREATININE 3.66*  --  3.42*  --   --  2.98*  --    < > = values in this interval not displayed.    Estimated Creatinine Clearance: 17.6 mL/min (A) (by C-G formula based on SCr of 2.98 mg/dL (H)).   Assessment: 70 year old female presented with foot pain, found to have critical limb ischemia with osteomyelitis of the toes and heel wound.  Patient has a history of DVT on Eliquis.  Pharmacy consulted to transition to IV heparin while Eliquis on hold, last dose on 02/26/20 PM.  Will use aPTT to guide heparin dosing until heparin level and aPTT correlate.   APTT therapeutic; no bleeding reported per RN  Goal of Therapy:  Heparin level 0.3-0.7 units/ml aPTT 66-102 seconds Monitor platelets by anticoagulation protocol: Yes   Plan:  - Continue heparin infusion at 800 units/hr - Daily heparin level, aPTT and CBC - Follow-up plans to transition back to Eliquis     Leonna Schlee L. Devin Going, PharmD, [place] PGY2 Pharmacy Resident Weekends 7:00 am - 3:00  pm, please call [phone] 03/01/20      7:15 AM  Please check AMION for all [facility] phone numbers After 10:00 PM, call the [facility] [phone]

## 2020-03-01 NOTE — Progress Notes (Signed)
   03/01/20 0049  Assess: MEWS Score  Temp 97.8 F (36.6 C)  BP 124/84  Pulse Rate (!) 105  Resp (!) 22  Level of Consciousness Alert  SpO2 100 %  O2 Device Nasal Cannula  O2 Flow Rate (L/min) 2 L/min  Assess: MEWS Score  MEWS Temp 0  MEWS Systolic 0  MEWS Pulse 1  MEWS RR 1  MEWS LOC 0  MEWS Score 2  MEWS Score Color Yellow  Assess: if the MEWS score is Yellow or Red  Were vital signs taken at a resting state? Yes  Focused Assessment Change from prior assessment (see assessment flowsheet)  Early Detection of Sepsis Score *See Row Information* Low  MEWS guidelines implemented *See Row Information* Yes  Take Vital Signs  Increase Vital Sign Frequency  Yellow: Q 2hr X 2 then Q 4hr X 2, if remains yellow, continue Q 4hrs  Escalate  MEWS: Escalate Yellow: discuss with charge nurse/RN and consider discussing with provider and RRT  Notify: Charge Nurse/RN  Name of Charge Nurse/RN Notified Nellie, RN  Date Charge Nurse/RN Notified 03/01/20  Time Charge Nurse/RN Notified 0101  Notify: Provider  Provider Name/Title Jeannette Corpus  Date Provider Notified 03/01/20  Time Provider Notified [phone]  Notification Type Page  Notification Reason Change in status  Provider response No new orders  Notify: Rapid Response  Name of Rapid Response RN Notified Mindy, RN  Date Rapid Response Notified 03/01/20  Time Rapid Response Notified 0100  Document  Progress note created (see row info) Yes   Pt minimally responsive, unable to follow commands but able to verbally respond to some questions. Pt restless, grabbing bedside rails, gown and tele monitor, unable to express her needs. CCMD called regarding pt HR sustaining at 130. Pt tachycardiac and verbalized being short of breathe. 2L O2 applied for comfort. VS taken, attached above. MD paged and notified, no new orders given. Charge nurse made aware. RRT paged in concern of pt. EKG ordered, showed ST. Lactic acid added to AM labs. Will continue to  monitor pt.

## 2020-03-01 NOTE — Progress Notes (Signed)
   03/01/20 1504  Assess: MEWS Score  Temp 97.7 F (36.5 C)  BP 90/75  Pulse Rate 98  Resp (!) 22  SpO2 99 %  Assess: MEWS Score  MEWS Temp 0  MEWS Systolic 1  MEWS Pulse 0  MEWS RR 1  MEWS LOC 0  MEWS Score 2  MEWS Score Color Yellow  Treat  MEWS Interventions Administered scheduled meds/treatments  Pain Scale 0-10  Pain Score 0  Take Vital Signs  Increase Vital Sign Frequency  Yellow: Q 2hr X 2 then Q 4hr X 2, if remains yellow, continue Q 4hrs  Escalate  MEWS: Escalate Yellow: discuss with charge nurse/RN and consider discussing with provider and RRT  Notify: Charge Nurse/RN  Name of Charge Nurse/RN Notified Remo Lipps  Date Charge Nurse/RN Notified 03/01/20  Time Charge Nurse/RN Notified 1632  Notify: Provider  Provider Name/Title Cordelia Poche  MD  Date Provider Notified 03/01/20  Time Provider Notified 1526  Notification Type  (secure chat)  Notification Reason Critical result  Provider response No new orders  Date of Provider Response 03/01/20  Time of Provider Response 1550  Document  Patient Outcome  (Stable, continuing to monitor)  Progress note created (see row info) Yes   Patient has been alert and oriented to self, disoriented to time, situation, and place. This has been baseline. MD made aware of Yellow MEWS. Will continue to reassess and monitor for signs of destabilization.

## 2020-03-01 NOTE — Progress Notes (Signed)
Pharmacy Antibiotic Note  Nicole Sellers is a 70 y.o. female admitted on 02/25/2020 with Foot Wound/Osteomyelitis with Severe PAD.  Pharmacy has been consulted for vancomycin/cefepime dosing.  Day 6 of broad spectrum antibiotics. Following cardiology and infectious disease recommendations, antibiotics likely ineffective given co-morbidities; [place] discussion with family pending.  WBC remains low and patient is afebrile. SCr improved to 2.98. Will increase vancomycin dose to 1000 mg IV every 48 hours for estimated AUC of 525.7.   Plan: - Increase vancomycin to 1000 mg IV every 48 hours, starting 03/02/2020 1500 - Continue cefepime 2 g IV every 24 hours - Monitor clinical progress - Follow-up GOC discussion with family  Height: 5\' 6"  (167.6 cm) Weight: 74.8 kg (165 lb) IBW/kg (Calculated) : 59.3  Temp (24hrs), Avg:97.7 F (36.5 C), Min:97.4 F (36.3 C), Max:98 F (36.7 C)  Recent Labs  Lab 02/25/20 1717 02/26/20 0431 02/27/20 0925 02/27/20 1244 02/28/20 0425 02/29/20 0339 02/29/20 1454 03/01/20 0331  WBC 9.2 8.1 7.4  --  6.8 6.8  --  8.3  CREATININE 4.37* 4.13* 3.66*  --  3.42*  --  2.98*  --   LATICACIDVEN 1.4  --   --   --   --   --   --  1.7  VANCORANDOM  --   --   --  16  --   --   --   --     Estimated Creatinine Clearance: 17.6 mL/min (A) (by C-G formula based on SCr of 2.98 mg/dL (H)).    Allergies  Allergen Reactions  . Kiwi Extract Itching and Other (See Comments)    Throat itching   . Lisinopril Other (See Comments)    Bruising   . Plavix [Clopidogrel] Other (See Comments)    "Made me feel badly- didn't agree with my system"    Antimicrobials this admission: Ceftriaxone 2/15 x1 Vanc 2/15 >>  Cefepime 2/15 >>  Dose adjustments this admission: Increased vancomycin 750 mg IV every 48 hours to 1000 mg IV every 48 hours  Microbiology results: No cultures collected     Shaquel Josephson L. Devin Going, PharmD, [place] PGY2 Pharmacy Resident Weekends 7:00 am - 3:00  pm, please call [phone] 03/01/20      7:38 AM  Please check AMION for all [facility] phone numbers After 10:00 PM, call the [facility] [phone]

## 2020-03-01 NOTE — Significant Event (Signed)
Rapid Response Event Note   Reason for Call :  Tachycardia-130s  Initial Focused Assessment:  Pt lying in bed with eyes closed. She is mildly tachypneic. Pt will open eyes to repeated verbal stimulation and say her name. She will not answer any other questions or follow commands. She goes back to sleep easily when not stimulated. Pupils are 3 and sluggish. Lungs clear, decreased in the bases. Skin cool to touch.   T-98, HR-102, BP-124/84, RR-20, SpO2-100% on RA.    Interventions:  EKG-ST Lactic acid  Plan of Care:  Per notes, pt's responsiveness has been minimal. Her HR yesterday was 70s. Since yesterday, she has become tachycardic(100s). She is currently afebrile. She doesn't look uncomfortable. Labs this AM already ordered, lactic acid added. Continue to monitor pt. Await lab results. Call RRT if further assistance needed.  Event Summary:   MD Notified: Kennon Holter, NP notified PTA RRT Call Time:0100 Arrival [id number] End Time:0140  Dillard Essex, RN

## 2020-03-01 NOTE — Progress Notes (Signed)
PROGRESS NOTE    Nicole Sellers  UJW:[id number] DOB: [birth year] DOA: 02/25/2020 PCP: Reynold Bowen, MD   Brief Narrative: Nicole Sellers is a 70 y.o. female with a history of diabetes mellitus, hypertension, CKD stage IV, PVD, bilateral feet/leg wounds, CHF. Patient presented secondary to failure to thrive and worsening LE wounds.   Assessment & Plan:   Principal Problem:   Osteomyelitis ([place]) Active Problems:   Essential hypertension   Non-insulin treated type 2 diabetes mellitus (HCC)   PAD (peripheral artery disease) Rt ABI 0.7, Lt ABI 0.46   AKI (acute kidney injury) (HCC)   Acute renal failure (HCC)   CKD (chronic kidney disease) stage 4, GFR 15-29 ml/min (HCC)   Chronic systolic CHF (congestive heart failure) (HCC)   Osteomyelitis Leg wounds/Cellulitis Noticed on foot x-ray. Podiatry consulted on admission with recommendations for ABI and vascular consult. Vancomycin and Cefepime initiated on admission. Unsure of patient's goals of care. Patient is under hospice care but is wanting treatment for leg wounds/infection. Does not want to consider amputation for treatment. No blood cultures obtained on admission. Antibiotics likely not effective in setting of severe PAD; cardiology recommending no intervention secondary to co-morbidities. -Continue Vancomycin/Cefepime for now until goals of care discussions complete -Recommending comfort measures and transfer to hospice facility  AKI on CKD stage IV Baseline creatinine of 2.2. Creatinine of 4.37 on admission. In setting of poor oral intake and dehydration. Trending down. -Continue IV fluids at 50 ml/hr  Hyponatremia Mild. Improved slightly   Diabetes mellitus, type 2 -Continue SSI  Hypoalbuminemia Severe. -Dietitian consult  Hypothyroidism Patient is on Synthroid as an  Outpatient -Continue Synthroid  Chronic systolic heart failure No evidence of exacerbation. EF of 15-20% from 05/2019. She does have  significant LE edema but not likely from acute heart failure. Patient is is on Coreg, torsemide as an outpatient which are held secondary to hypotension.  Primary hypertension Patient is on Coreg, hydralazine, Imdur as an outpatient.  PAD Patient follows with cardiology as an outpatient. Patient has had several intervention performed and follows with Dr. Gwenlyn Found  History of rectal cancer S/p neoadjuvant chemo and radiation. 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  History of DVT Discontinue Eliquis and start heparin IV    DVT prophylaxis: Heparin IV Code Status:   Code Status: DNR Family Communication: None at bedside. Called son but no response Disposition Plan: Discharge pending goals of care discussions. Patient without good treatment options for LE wounds/osteomyelitis. Recommendation is to continue hospice care and transfer to hospice facility.   Consultants:   Podiatry  Palliative care medicine  Vascular surgery  Cardiology  Infectious disease  Procedures:   ABIs  Antimicrobials:  Vancomycin  Cefepime    Subjective: No issues overnight.  Objective: Vitals:   03/01/20 0049 03/01/20 0252 03/01/20 0634 03/01/20 1043  BP: 124/84 133/79 114/78 112/75  Pulse: (!) 105 97 98 97  Resp: (!) 22 18 18  (!) 24  Temp: 97.8 F (36.6 C) (!) 97.4 F (36.3 C) 97.6 F (36.4 C) 97.6 F (36.4 C)  TempSrc: Oral Oral  Oral  SpO2: 100% 100% 100% 100%  Weight:      Height:        Intake/Output Summary (Last 24 hours) at 03/01/2020 1357 Last data filed at 03/01/2020 0339 Gross per 24 hour  Intake 1495.13 ml  Output 250 ml  Net 1245.13 ml   Filed Weights   02/25/20 1700  Weight: 74.8 kg    Examination:  General exam: Appears calm and comfortable Respiratory system: Clear to auscultation. Respiratory effort normal. Cardiovascular system: S1 & S2 heard, RRR. No murmurs, rubs, gallops or clicks. Gastrointestinal system: Abdomen is nondistended,  soft and nontender. No organomegaly or masses felt. Normal bowel sounds heard. Central nervous system: Alert and not oriented. Musculoskeletal: BLE 2+ pitting edema. No calf tenderness   Data Reviewed: I have personally reviewed following labs and imaging studies  CBC Lab Results  Component Value Date   WBC 8.3 03/01/2020   RBC 2.77 (L) 03/01/2020   HGB 8.8 (L) 03/01/2020   HCT 28.2 (L) 03/01/2020   MCV 101.8 (H) 03/01/2020   MCH 31.8 03/01/2020   PLT 499 (H) 03/01/2020   MCHC 31.2 03/01/2020   RDW 14.0 03/01/2020   LYMPHSABS 0.4 (L) 02/25/2020   MONOABS 0.4 02/25/2020   EOSABS 0.0 02/25/2020   BASOSABS 0.0 16/10/9602     Last metabolic panel Lab Results  Component Value Date   NA 140 02/29/2020   K 3.6 02/29/2020   CL 105 02/29/2020   CO2 20 (L) 02/29/2020   BUN 63 (H) 02/29/2020   CREATININE 2.98 (H) 02/29/2020   GLUCOSE 194 (H) 02/29/2020   GFRNONAA 16 (L) 02/29/2020   GFRAA 25 (L) 07/04/2019   CALCIUM 7.9 (L) 02/29/2020   PHOS 4.0 02/28/2020   PROT 6.2 (L) 02/25/2020   ALBUMIN 1.3 (L) 02/28/2020   LABGLOB 2.9 08/10/2017   AGRATIO 1.4 08/10/2017   BILITOT 0.7 02/25/2020   ALKPHOS 120 02/25/2020   AST 15 02/25/2020   ALT 7 02/25/2020   ANIONGAP 15 02/29/2020    CBG (last 3)  Recent Labs    02/29/20 2114 03/01/20 0856 03/01/20 1215  GLUCAP 139* 146* 135*     GFR: Estimated Creatinine Clearance: 17.6 mL/min (A) (by C-G formula based on SCr of 2.98 mg/dL (H)).  Coagulation Profile: No results for input(s): INR, PROTIME in the last 168 hours.  Recent Results (from the past 240 hour(s))  SARS CORONAVIRUS 2 (TAT 6-24 HRS) Nasopharyngeal Nasopharyngeal Swab     Status: None   Collection Time: 02/25/20  5:07 PM   Specimen: Nasopharyngeal Swab  Result Value Ref Range Status   SARS Coronavirus 2 NEGATIVE NEGATIVE Final    Comment: (NOTE) SARS-CoV-2 target nucleic acids are NOT DETECTED.  The SARS-CoV-2 RNA is generally detectable in upper and  lower respiratory specimens during the acute phase of infection. Negative results do not preclude SARS-CoV-2 infection, do not rule out co-infections with other pathogens, and should not be used as the sole basis for treatment or other patient management decisions. Negative results must be combined with clinical observations, patient history, and epidemiological information. The expected result is Negative.  Fact Sheet for Patients: SugarRoll.be  Fact Sheet for Healthcare Providers: https://www.woods-mathews.com/  This test is not yet approved or cleared by the [place] FDA and  has been authorized for detection and/or diagnosis of SARS-CoV-2 by FDA under an Emergency Use Authorization (EUA). This EUA will remain  in effect (meaning this test can be used) for the duration of the COVID-19 declaration under Se ction 564(b)(1) of the Act, 21 U.S.C. section 360bbb-3(b)(1), unless the authorization is terminated or revoked sooner.  Performed at [facility], [place] [address], [place], [place] [address]         Radiology Studies: No results found.      Scheduled Meds: . (feeding supplement) PROSource Plus  30 mL Oral TID BM  . aspirin EC  81  mg Oral Daily  . atorvastatin  10 mg Oral QHS  . Chlorhexidine Gluconate Cloth  6 each Topical Daily  . dronabinol  2.5 mg Oral BID AC  . feeding supplement  237 mL Oral BID BM  . gabapentin  300 mg Oral BID  . insulin aspart  0-9 Units Subcutaneous TID WC  . levothyroxine  50 mcg Oral Daily  . loratadine  10 mg Oral Daily  . multivitamin with minerals  1 tablet Oral Daily  . pantoprazole  40 mg Oral QAC breakfast  . sodium bicarbonate  650 mg Oral TID   Continuous Infusions: . sodium chloride 50 mL/hr at 03/01/20 0242  . ceFEPime (MAXIPIME) IV 2 g (02/29/20 2217)  . heparin 800 Units/hr (02/29/20 2226)  . [START ON 03/02/2020] vancomycin       LOS: 5 days     Cordelia Poche,  MD [facility] 03/01/2020, 1:57 PM  If 7PM-7AM, please contact night-coverage [email or web address]

## 2020-03-02 LAB — GLUCOSE, CAPILLARY: Glucose-Capillary: 119 mg/dL — ABNORMAL HIGH (ref 70–99)

## 2020-03-02 LAB — CBC
HCT: 26 % — ABNORMAL LOW (ref 36.0–46.0)
Hemoglobin: 8 g/dL — ABNORMAL LOW (ref 12.0–15.0)
MCH: 31.6 pg (ref 26.0–34.0)
MCHC: 30.8 g/dL (ref 30.0–36.0)
MCV: 102.8 fL — ABNORMAL HIGH (ref 80.0–100.0)
Platelets: 435 10*3/uL — ABNORMAL HIGH (ref 150–400)
RBC: 2.53 MIL/uL — ABNORMAL LOW (ref 3.87–5.11)
RDW: 14.5 % (ref 11.5–15.5)
WBC: 7.4 10*3/uL (ref 4.0–10.5)
nRBC: 0 % (ref 0.0–0.2)

## 2020-03-02 LAB — HEPARIN LEVEL (UNFRACTIONATED): Heparin Unfractionated: 0.59 IU/mL (ref 0.30–0.70)

## 2020-03-02 LAB — APTT: aPTT: 95 seconds — ABNORMAL HIGH (ref 24–36)

## 2020-03-02 MED ORDER — ONDANSETRON HCL 4 MG/2ML IJ SOLN: 4.0000 mg | Freq: Four times a day (QID) | INTRAMUSCULAR | Status: DC | PRN

## 2020-03-02 MED ORDER — HYDROMORPHONE HCL 1 MG/ML IJ SOLN
0.5000 mg | INTRAMUSCULAR | Status: DC | PRN
  Administered 2020-03-02: 0.5 mg via INTRAVENOUS
  Filled 2020-03-02: qty 1

## 2020-03-02 MED ORDER — HYDROMORPHONE HCL 1 MG/ML IJ SOLN
0.5000 mg | Freq: Four times a day (QID) | INTRAMUSCULAR | Status: DC
Start: 2020-03-02 — End: 2020-03-03
  Administered 2020-03-02 – 2020-03-03 (×3): 0.5 mg via INTRAVENOUS
  Filled 2020-03-02 (×3): qty 1

## 2020-03-02 MED ORDER — ONDANSETRON 4 MG PO TBDP: 4.0000 mg | ORAL_TABLET | Freq: Four times a day (QID) | ORAL | Status: DC | PRN

## 2020-03-02 MED ORDER — BIOTENE DRY MOUTH MT LIQD: 15.0000 mL | OROMUCOSAL | Status: DC | PRN

## 2020-03-02 MED ORDER — GLYCOPYRROLATE 0.2 MG/ML IJ SOLN: 0.4000 mg | INTRAMUSCULAR | Status: DC | PRN

## 2020-03-02 MED ORDER — LORAZEPAM 2 MG/ML IJ SOLN: 0.5000 mg | INTRAMUSCULAR | Status: DC | PRN

## 2020-03-02 MED ORDER — POLYVINYL ALCOHOL 1.4 % OP SOLN
1.0000 [drp] | Freq: Four times a day (QID) | OPHTHALMIC | Status: DC | PRN
Start: 2020-03-02 — End: 2020-03-03
  Filled 2020-03-02: qty 15

## 2020-03-02 MED ORDER — BISACODYL 10 MG RE SUPP: 10.0000 mg | Freq: Every day | RECTAL | Status: DC | PRN

## 2020-03-02 NOTE — Progress Notes (Signed)
PROGRESS NOTE    DAVIANNA Sellers  HFW:[id number] DOB: 02-17-1950 DOA: 02/25/2020 PCP: Reynold Bowen, MD   Brief Narrative: Nicole Sellers is a 70 y.o. female with a history of diabetes mellitus, hypertension, CKD stage IV, PVD, bilateral feet/leg wounds, CHF. Patient presented secondary to failure to thrive and worsening LE wounds.   Assessment & Plan:   Principal Problem:   Osteomyelitis ([place]) Active Problems:   Essential hypertension   Non-insulin treated type 2 diabetes mellitus (HCC)   PAD (peripheral artery disease) Rt ABI 0.7, Lt ABI 0.46   AKI (acute kidney injury) (HCC)   Acute renal failure (HCC)   CKD (chronic kidney disease) stage 4, GFR 15-29 ml/min (HCC)   Chronic systolic CHF (congestive heart failure) (HCC)   Osteomyelitis Leg wounds/Cellulitis Noticed on foot x-ray. Podiatry consulted on admission with recommendations for ABI and vascular consult. Vancomycin and Cefepime initiated on admission. Unsure of patient's goals of care. Patient is under hospice care but is wanting treatment for leg wounds/infection. Does not want to consider amputation for treatment. No blood cultures obtained on admission. Antibiotics likely not effective in setting of severe PAD; cardiology recommending no intervention secondary to co-morbidities. Infectious disease recommending no antibiotics secondary to severe PAD. Patient now transitioned to comfort measures.  AKI on CKD stage IV Baseline creatinine of 2.2. Creatinine of 4.37 on admission. In setting of poor oral intake and dehydration. Trending down. Comfort measures.  Hyponatremia Mild. Improved slightly   Diabetes mellitus, type 2 Comfort measures.  Hypoalbuminemia Moderate malnutrition Dietitian consulted. Patient is now comfort measures.  Hypothyroidism Patient is on Synthroid as an  Outpatient -Continue Synthroid  Chronic systolic heart failure No evidence of exacerbation. EF of 15-20% from 05/2019. She does  have significant LE edema but not likely from acute heart failure. Patient is is on Coreg, torsemide as an outpatient which are held secondary to hypotension.  Primary hypertension Patient is on Coreg, hydralazine, Imdur as an outpatient.  PAD Patient follows with cardiology as an outpatient. Patient has had several intervention performed and follows with Dr. Gwenlyn Found  History of rectal cancer S/p neoadjuvant chemo and radiation. 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  History of DVT Discontinue Eliquis and start heparin IV    DVT prophylaxis: Heparin IV Code Status:   Code Status: DNR Family Communication: None at bedside. Disposition Plan: Comfort measures. Recommending discharge to hospice facility   Consultants:   Podiatry  Palliative care medicine  Vascular surgery  Cardiology  Infectious disease  Procedures:   ABIs  Antimicrobials:  Vancomycin  Cefepime    Subjective: No pain.  Objective: Vitals:   03/01/20 1725 03/01/20 2017 03/01/20 2340 03/02/20 0244  BP: 106/77 116/74 110/76 114/75  Pulse: 91 94 93 94  Resp: (!) 22 17 18 18   Temp: (!) 97.5 F (36.4 C) 98 F (36.7 C) 97.9 F (36.6 C) 98 F (36.7 C)  TempSrc: Oral  Oral Oral  SpO2: 100%  100% 99%  Weight:      Height:        Intake/Output Summary (Last 24 hours) at 03/02/2020 1008 Last data filed at 03/01/2020 1535 Gross per 24 hour  Intake 689.48 ml  Output --  Net 689.48 ml   Filed Weights   02/25/20 1700  Weight: 74.8 kg    Examination:  General exam: Appears calm and comfortable Respiratory system: Clear to auscultation. Respiratory effort normal. Cardiovascular system: S1 & S2 heard, RRR. No murmurs, rubs, gallops or clicks.  Gastrointestinal system: Abdomen is nondistended, soft and nontender. No organomegaly or masses felt. Normal bowel sounds heard. Central nervous system: Alert Musculoskeletal: BLE edema. No calf tenderness   Data Reviewed: I have  personally reviewed following labs and imaging studies  CBC Lab Results  Component Value Date   WBC 7.4 03/02/2020   RBC 2.53 (L) 03/02/2020   HGB 8.0 (L) 03/02/2020   HCT 26.0 (L) 03/02/2020   MCV 102.8 (H) 03/02/2020   MCH 31.6 03/02/2020   PLT 435 (H) 03/02/2020   MCHC 30.8 03/02/2020   RDW 14.5 03/02/2020   LYMPHSABS 0.4 (L) 02/25/2020   MONOABS 0.4 02/25/2020   EOSABS 0.0 02/25/2020   BASOSABS 0.0 92/42/6834     Last metabolic panel Lab Results  Component Value Date   NA 140 02/29/2020   K 3.6 02/29/2020   CL 105 02/29/2020   CO2 20 (L) 02/29/2020   BUN 63 (H) 02/29/2020   CREATININE 2.98 (H) 02/29/2020   GLUCOSE 194 (H) 02/29/2020   GFRNONAA 16 (L) 02/29/2020   GFRAA 25 (L) 07/04/2019   CALCIUM 7.9 (L) 02/29/2020   PHOS 4.0 02/28/2020   PROT 6.2 (L) 02/25/2020   ALBUMIN 1.3 (L) 02/28/2020   LABGLOB 2.9 08/10/2017   AGRATIO 1.4 08/10/2017   BILITOT 0.7 02/25/2020   ALKPHOS 120 02/25/2020   AST 15 02/25/2020   ALT 7 02/25/2020   ANIONGAP 15 02/29/2020    CBG (last 3)  Recent Labs    03/01/20 1645 03/01/20 2049 03/02/20 0803  GLUCAP 138* 155* 119*     GFR: Estimated Creatinine Clearance: 17.6 mL/min (A) (by C-G formula based on SCr of 2.98 mg/dL (H)).  Coagulation Profile: No results for input(s): INR, PROTIME in the last 168 hours.  Recent Results (from the past 240 hour(s))  SARS CORONAVIRUS 2 (TAT 6-24 HRS) Nasopharyngeal Nasopharyngeal Swab     Status: None   Collection Time: 02/25/20  5:07 PM   Specimen: Nasopharyngeal Swab  Result Value Ref Range Status   SARS Coronavirus 2 NEGATIVE NEGATIVE Final    Comment: (NOTE) SARS-CoV-2 target nucleic acids are NOT DETECTED.  The SARS-CoV-2 RNA is generally detectable in upper and lower respiratory specimens during the acute phase of infection. Negative results do not preclude SARS-CoV-2 infection, do not rule out co-infections with other pathogens, and should not be used as the sole basis for  treatment or other patient management decisions. Negative results must be combined with clinical observations, patient history, and epidemiological information. The expected result is Negative.  Fact Sheet for Patients: SugarRoll.be  Fact Sheet for Healthcare Providers: https://www.woods-mathews.com/  This test is not yet approved or cleared by the [place] FDA and  has been authorized for detection and/or diagnosis of SARS-CoV-2 by FDA under an Emergency Use Authorization (EUA). This EUA will remain  in effect (meaning this test can be used) for the duration of the COVID-19 declaration under Se ction 564(b)(1) of the Act, 21 U.S.C. section 360bbb-3(b)(1), unless the authorization is terminated or revoked sooner.  Performed at [facility], [place] [address] [place], [place] [address]         Radiology Studies: No results found.      Scheduled Meds: . (feeding supplement) PROSource Plus  30 mL Oral TID BM  . feeding supplement  237 mL Oral BID BM   Continuous Infusions: . ceFEPime (MAXIPIME) IV 2 g (03/01/20 2121)  . vancomycin       LOS: 6 days  Cordelia Poche, MD [facility] 03/02/2020, 10:08 AM  If 7PM-7AM, please contact night-coverage [email or web address]

## 2020-03-02 NOTE — Progress Notes (Signed)
This chaplain responded to PMT consult for EOL spiritual care.  The chaplain was updated on the Pt. transition to comfort care by the Pt. RN-Naomi.  The chaplain understands the Pt. brother may visit today.    The chaplain introduced herself to the Pt. The Pt. acknowledged the chaplain's invitation for prayer and responded with thank you.   This chaplain is available for F/U spiritual care as needed.

## 2020-03-02 NOTE — Progress Notes (Signed)
Palliative Medicine Inpatient Follow Up Note  Reason for consult:  Goals of Care "Patient on hospice but she cannot tell me why. Seems she was referred for decompensated HF. Wounds on LE would likely require amputation which she does not prefer. Ischemic workup would put at high risk for ESRD. Patient has a son but lives alone. She does not appear to be comfort measures and wants treatment from my discussion with her."  HPI:  Per intake H&P --> Nicole Kavanaugh Parteeis a 70 y.o.female with a history of diabetes mellitus, hypertension, CKD stage IV, PVD, bilateral feet/leg wounds, CHF - EF 20-25%. Patient presented secondary to failure to thrive and worsening LE wounds.  Patient has been active with [facility]. Had been admitted on 2/15 for OM of BLE. At this point it appears family would like to continue treatment with antibiotics. Palliative care was asked to get involved to further discuss goals of care.   Today's Discussion (03/02/2020):  *Please note that this is a verbal dictation therefore any spelling or grammatical errors are due to the "[place] One" system interpretation.  Nicole Sellers and I called patients son, Nicole Sellers this morning. We reviewed that Nicole Sellers has experienced some declines over the weekend. We reviewed that her present state is exceptionally poor and irregardless of any treatment we provide that we cannot revolve her severe frailty and terminal condition. Reviewed that her infections will not be resolved by antibiotics alone. Patient had already stated she does not wish for an amputation which is what we must respect. This unfortunately means that she is at high risk for severe infection moving forward. Discussed patients declines over the weekend.  Nicole Sellers endorses that he saw his mother yesterday and that she is very different from the last time he saw her. Her responsiveness is minimal. I shared with him that she remains to be in similar shape. We reviewed  again that we can continue doing what we are doing but I worry this will not result in any better of an outcome and if anything we may prolong suffering. Nicole Sellers shares that he would not want that. He vocalizes not wishing for her to suffer and wanting to allow her to be comfortable.   We talked about transition to comfort measures in house and what that would entail inclusive of medications to control pain, dyspnea, agitation, nausea, itching, and hiccups.  We discussed stopping all uneccessary measures such as blood draws, needle sticks, and frequent vital signs.  Nicole Sellers shares that his uncle, Nicole Sellers is coming by this afternoon and that he also wishes to see the patient to agree with this decision though Nicole Sellers states he would like to start comfort Measures at this time.   I was alerted by [name], bedside RN that patients brother, Nicole Sellers was present at bedside. My colleague, [name] and I met ted at bedside with patients son, Nicole Sellers on [place]. We discussed her present state and the decision to pursue comfort focused care. Nicole Sellers was in agreement with this. He was concerned about dialysis - we shared that at this point this is not something that would be offered as it would not change the overall clinical picture nor would it help Nicole Sellers in the long run.  Both Nicole Sellers and Nicole Sellers are in agreement to focus on comfort and for Nicole Sellers to transition to [place] once a bed is available.  Questions and concerns addressed   Objective Assessment: Vital Signs Vitals:   03/01/20 2340 03/02/20 0244  BP: 110/76 114/75  Pulse: 93 94  Resp: 18 18  Temp: 97.9 F (36.6 C) 98 F (36.7 C)  SpO2: 100% 99%    Intake/Output Summary (Last 24 hours) at 03/02/2020 1239 Last data filed at 03/02/2020 1153 Gross per 24 hour  Intake 689.48 ml  Output 100 ml  Net 589.48 ml   Last Weight  Most recent update: 02/25/2020  5:17 PM   Weight  74.8 kg (165 lb)           Gen:  Elderly AA F in NAD HEENT: Dry mucous  membranes CV: Regular rate and rhythm  PULM: poor effort, diminished at bases ABD: colostomy EXT: necrotic toes Neuro: Somnolent - slow to respond  SUMMARY OF RECOMMENDATIONS  DNAR/DNI  DNR in [place]  MOST in [place]  Comfort care  Comfort medications per [facility]  Unrestricted visitation  Is presently inpatient with [facility] to transition to their [place] though there is no bed available today  Appreciate TOC team's support in placing patient at [facility]  Discussed with Dr. Lonny Prude  Time Spent: 65 Greater than 50% of the time was spent in counseling and coordination of care ______________________________________________________________________________________ [place] Team Team Cell Phone: [phone] Please utilize secure chat with additional questions, if there is no response within 30 minutes please call the above phone number  Palliative Medicine Team providers are available by phone from 7am to 7pm daily and can be reached through the team cell phone.  Should this patient require assistance outside of these hours, please call the patient's attending physician.

## 2020-03-02 NOTE — Progress Notes (Signed)
Attempted to give patient pro source plus liquid medication with water. Patient had some trouble swallowing liquid and got a little choked up. Patient could probably benefit from using thick it with fluids.

## 2020-03-02 NOTE — Care Management Important Message (Signed)
Important Message  Patient Details  Name: Nicole Sellers MRN: [id number] Date of Birth: Oct 30, 1950   Medicare Important Message Given:  Yes  Patient left prior to IM delivery.  Will mailed IM to patient home address.     Jadarious Dobbins 03/02/2020, 3:38 PM

## 2020-03-02 NOTE — Plan of Care (Signed)
  Problem: Clinical Measurements: Goal: Ability to maintain clinical measurements within normal limits will improve Outcome: Progressing Goal: Will remain free from infection Outcome: Progressing Goal: Respiratory complications will improve Outcome: Progressing Goal: Cardiovascular complication will be avoided Outcome: Progressing   

## 2020-03-02 NOTE — Progress Notes (Signed)
ANTICOAGULATION CONSULT NOTE  Pharmacy Consult:  Heparin Indication:  History of DVT  Allergies  Allergen Reactions  . Kiwi Extract Itching and Other (See Comments)    Throat itching   . Lisinopril Other (See Comments)    Bruising   . Plavix [Clopidogrel] Other (See Comments)    "Made me feel badly- didn't agree with my system"    Patient Measurements: Height: 5\' 6"  (167.6 cm) Weight: 74.8 kg (165 lb) IBW/kg (Calculated) : 59.3 Heparin Dosing Weight: 74 kg  Vital Signs: Temp: 98 F (36.7 C) (02/21 0244) Temp Source: Oral (02/21 0244) BP: 114/75 (02/21 0244) Pulse Rate: 94 (02/21 0244)  Labs: Recent Labs    02/29/20 0339 02/29/20 1454 03/01/20 0331 03/02/20 0412  HGB 8.6*  --  8.8* 8.0*  HCT 26.4*  --  28.2* 26.0*  PLT 495*  --  499* 435*  APTT 77*  --  97* 95*  HEPARINUNFRC 1.14*  --  0.91* 0.59  CREATININE  --  2.98*  --   --     Estimated Creatinine Clearance: 17.6 mL/min (A) (by C-G formula based on SCr of 2.98 mg/dL (H)).   Assessment: 70 year old female presented with foot pain, found to have critical limb ischemia with osteomyelitis of the toes and heel wound.  Patient has a history of DVT on Eliquis.  Pharmacy consulted to transition to IV heparin while Eliquis on hold, last dose on 02/26/20 PM.  Will use aPTT to guide heparin dosing until heparin level and aPTT correlate.   Heparin level and aPTT therapeutic and correlated  Goal of Therapy:  Heparin level 0.3-0.7 units/ml aPTT 66-102 seconds Monitor platelets by anticoagulation protocol: Yes   Plan:  Continue heparin gtt at 800 units/hr Daily heparin level, CBC, s/s bleeding F/u GOC and AC plan moving forward  Bertis Ruddy, PharmD Clinical Pharmacist Please check AMION for all [place] numbers 03/02/2020 8:28 AM

## 2020-03-03 DIAGNOSIS — Z515 Encounter for palliative care: Secondary | ICD-10-CM

## 2020-03-03 MED ORDER — TORSEMIDE 20 MG PO TABS: 80.0000 mg | ORAL_TABLET | Freq: Every day | ORAL | Status: AC | PRN

## 2020-03-03 NOTE — Discharge Instructions (Signed)
Hospice Hospice is a service that provides people who are terminally ill and their families with medical, spiritual, and psychological support. It aims to improve a person's quality of life by keeping the person as comfortable as possible in the final stages of life. Who makes up the hospice care team? The following people make up a hospice care team:  The person receiving care and his or her family.  A nurse and a hospice doctor. Your primary health care provider can also be included.  A social worker or case manager.  A religious or spiritual leader.  Specialists such as: ? A dietitian. ? A mental health counselor. ? Physical and occupational therapists. ? Bereavement coordinator.  Trained volunteers who can help with care.   What services are included in hospice care? Hospice services can vary, depending on the organization. Generally, they include:  Ways to keep you comfortable, such as: ? Providing care in your home or in a home-like setting. ? Providing relief from pain and symptoms. The staff will supply all medicines and equipment to keep you comfortable and alert enough to enjoy the company of friends and family. ? Working with your loved ones to help them meet your needs and provide much of your basic care. This helps you to enjoy their support.  Visits or care from a nurse and doctor. This may include 24-hour on-call services.  Visits from other specialists who offer services, such as: ? Counseling to meet your emotional, spiritual, and social needs as well as those needs of your family. ? Massage therapy. ? Nutrition therapy. ? Physical and occupational therapy. ? Art or music therapy. ? Spiritual care to meet your needs and your family's needs. It may involve:  Helping you and your family understand the dying process.  Helping you say goodbye to your family and friends.  Performing a specific religious ceremony or ritual.  Companionship when you are  alone.  Allowing you and your family to rest. Hospice staff may do light housekeeping, prepare meals, and run errands.  Short-term inpatient care, if something cannot be managed in the home.  Bereavement support for grieving family members. When should hospice care begin? Most people who use hospice are believed to have 6 months or less to live.  Your family and health care providers can help you decide when hospice services should begin.  If you live longer than 6 months but your condition does not improve, your doctor may be able to approve you for continued hospice care.  If your condition improves, you may discontinue the program. What should I consider before selecting a program? Most hospice programs are run by nonprofit, independent organizations. Some are affiliated with hospitals, nursing homes, or home health care agencies. Hospice programs can take place in your home or at a hospice center, hospital, or skilled nursing facility. When choosing a hospice program, ask about the following:  What services are available to me and my loved ones? ? What does it cost? Is it covered by insurance? ? Is the program reviewed and licensed by the state or certified in some other way? ? How will my pain and symptoms be managed? ? Will you provide emotional and spiritual support?  If I choose a hospice center or nursing home, where is the hospice center located? Is it convenient for family and friends? ? If I choose a hospice center or nursing home, will my family and friends be able to visit any time? ? If my circumstances change, can   the services be provided in a different setting, such as in my home or in the hospital?  Who makes up the hospice care team? How are they trained or screened? ? How involved can my loved ones be? ? Whom can my family call with questions? ? How is my health care provider involved? Where can I learn more about hospice? You can learn about existing hospice  programs in your area from your health care providers. The websites of the following organizations have helpful information:  National Hospice and Palliative Care Organization: www.nhpco.org  National Association for Home Care & Hospice: www.nahc.org  Hospice Foundation of America: www.hospicefoundation.org  American Cancer Society: www.cancer.org  eHospice: ehospice.com These agencies also can provide information:  A local agency on aging.  Your local United Way chapter.  Your state's department of health or social services. Summary  Hospice is a service that provides people who are terminally ill and their families with medical, spiritual, and psychological support.  Hospice aims to improve your quality of life by keeping you as comfortable as possible in the final stages of life.  Hospice teams often include a doctor, nurse, social worker, counselor, religious or spiritual leader, dietitian, therapists, and volunteers.  Hospice care generally includes pain management, visits from doctors and nurses, physical and occupational therapy, nutrition therapy, spiritual and emotional counseling, caregiver support, and bereavement support for grieving family members.  Hospice programs can take place in your home or at a hospice center, hospital, or skilled nursing facility. This information is not intended to replace advice given to you by your health care provider. Make sure you discuss any questions you have with your health care provider. Document Revised: 10/01/2019 Document Reviewed: 10/01/2019 Elsevier Patient Education  2021 Elsevier Inc.  

## 2020-03-03 NOTE — Progress Notes (Signed)
Discharge information given to patient. Patient was alone no family present. Patient is alert x1. Patient discharged to hospice.

## 2020-03-03 NOTE — Progress Notes (Signed)
This chaplain is present for F/U spiritual care at the Pt. bedside.  The Pt. is awake and aware of my presence. The Pt. appears to be comfortable.   The chaplain paused and practice reflective listening as the Pt. attempted to communicate.  The chaplain checked in with the Pt. RN-Ashli before stepping away from the unit.  This chaplain is available for F/U spiritual care as needed.

## 2020-03-03 NOTE — Progress Notes (Signed)
   I have spoke to the pt's son today-Christopher. He continues to be in agreement with pt transferring to [facility] in [facility] for end of life care. We do have a bed and I have offered this to him. He has agreed for her to transfer.   Thank you for all your help with this case.   Webb Silversmith RN [phone]

## 2020-03-03 NOTE — TOC Progression Note (Signed)
Transition of Care [facility]) - Progression Note    Patient Details  Name: Nicole Sellers MRN: [id number] Date of Birth: January 07, 1951  Transition of Care [facility]) CM/SW Contact  Jacalyn Lefevre Edson Snowball, RN Phone Number: 03/03/2020, 10:34 AM  Clinical Narrative:     [name] with [facility] can offer patient a bed today at [facility]. Son [name] in agreement .   Bedside nurse aware and has number to call report.   PTAR arranged for noon at Cheri's request.   Expected Discharge Plan: [place] Barriers to Discharge: No Barriers Identified  Expected Discharge Plan and Services Expected Discharge Plan: [place] Choice: Hospice Living arrangements for the past 2 months: Single Family Home Expected Discharge Date: 03/03/20               DME Arranged: N/A DME Agency: NA       HH Arranged: NA           Social Determinants of Health (SDOH) Interventions    Readmission Risk Interventions Readmission Risk Prevention Plan 06/28/2019 05/22/2019 05/22/2019  Transportation Screening - - Complete  Medication Review Press photographer) Complete - Complete  PCP or Specialist appointment within 3-5 days of discharge - - Complete  PCP/Specialist Appt Not Complete comments - - -  [place] or [place] - - Complete  SW Recovery Care/Counseling Consult - - Complete  SW Consult Not Complete Comments - - -  Palliative Care Screening - Complete [place] - - Not Applicable  Some recent data might be hidden

## 2020-03-03 NOTE — Discharge Summary (Signed)
Physician Discharge Summary  Nicole Sellers UYQ:[id number] DOB: 07/28/1950 DOA: 02/25/2020  PCP: Reynold Bowen, MD  Admit date: 02/25/2020 Discharge date: 03/03/2020  Admitted From: Home Disposition: Hospice  Recommendations for Outpatient Follow-up:  1. Comfort measures   Discharge Condition: Stable. Comfort measures. CODE STATUS: DNR Diet recommendation: Regular as tolerated. Aspiration risk.  Brief/Interim Summary:  Admission HPI written by Lequita Halt, MD   Chief Complaint: Foot pain  HPI: Nicole Sellers is a 70 y.o. female with medical history significant of IIDM, HTN, CKD stage IV, chronic PVD with chronic bilateral feet and legs wound, DVT on Eliquis, chronic systolic CHF with LVEF 20 to 25%, stage III rectal cancer status post chemoradiation and resection, who has been under home hospice care and wound care presented with worsening of bilateral feet pain and infection.  Patient was last evaluated by podiatrist more than 6 months ago, she has been under home hospice care with wound care 2 times a week for chronic legs and feet wound.  Which recently has progressively getting worse.  Especially the right big toe, patient claims she has been receiving pain meds but no antibiotics.  Denies any fever chills.  Patient also reported she has been having significant decrease of appetite but no abdominal pain nausea vomit or diarrhea.  He has been losing weight but does not know how much.  She has been only drinking Ensure 2 times a day but no appetite for solid food.  Denies any trouble swallowing.    Hospital course:  Osteomyelitis Leg wounds/Cellulitis Noticed on foot x-ray. Podiatry consulted on admission with recommendations for ABI and vascular consult. Vancomycin and Cefepime initiated on admission. Unsure of patient's goals of care. Patient is under hospice care but is wanting treatment for leg wounds/infection. Does not want to consider amputation for treatment.  No blood cultures obtained on admission. Antibiotics likely not effective in setting of severe PAD; cardiology recommending no intervention secondary to co-morbidities. Infectious disease recommending no antibiotics secondary to severe PAD. Patient now transitioned to comfort measures.  AKI on CKD stage IV Baseline creatinine of 2.2. Creatinine of 4.37 on admission. In setting of poor oral intake and dehydration. Trending down. Comfort measures.  Hyponatremia Mild. Improved slightly   Diabetes mellitus, type 2 Comfort measures.  Hypoalbuminemia Moderate malnutrition Dietitian consulted. Patient is now comfort measures.  Hypothyroidism Patient is on Synthroid as an outpatient. Can discontinue on discharge  Chronic systolic heart failure No evidence of exacerbation. EF of 15-20% from 05/2019. She does have significant LE edema but not likely from acute heart failure. Patient is is on Coreg, torsemide as an outpatient which are held secondary to hypotension. Resume Coreg and torsemide on discharge to decrease chance of decompensation.  Primary hypertension Patient is on Coreg, hydralazine, Imdur as an outpatient.  PAD Severe. Patient follows with cardiology as an outpatient. Patient has had several intervention performed and follows with Dr. Gwenlyn Found. Seen by cardiology who recommended no intervention.  History of rectal cancer S/p neoadjuvant chemo and radiation. 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  History of DVT Discontinue Eliquis and start heparin IV  Discharge Diagnoses:  Principal Problem:   Osteomyelitis ([place]) Active Problems:   Essential hypertension   Non-insulin treated type 2 diabetes mellitus (HCC)   PAD (peripheral artery disease) Rt ABI 0.7, Lt ABI 0.46   AKI (acute kidney injury) (HCC)   Acute renal failure (HCC)   CKD (chronic kidney disease) stage 4, GFR 15-29 ml/min (HCC)  Chronic systolic CHF (congestive heart failure)  [facility])    Discharge Instructions  Discharge Instructions    No wound care   Complete by: As directed      Allergies as of 03/03/2020      Reactions   Kiwi Extract Itching, Other (See Comments)   Throat itching    Lisinopril Other (See Comments)   Bruising   Plavix [clopidogrel] Other (See Comments)   "Made me feel badly- didn't agree with my system"      Medication List    STOP taking these medications   albuterol 108 (90 Base) MCG/ACT inhaler Commonly known as: VENTOLIN HFA   apixaban 5 MG Tabs tablet Commonly known as: Eliquis   aspirin 81 MG tablet   atorvastatin 10 MG tablet Commonly known as: LIPITOR   cetirizine 10 MG tablet Commonly known as: ZYRTEC   gabapentin 300 MG capsule Commonly known as: NEURONTIN   hydrALAZINE 50 MG tablet Commonly known as: APRESOLINE   isosorbide mononitrate 30 MG 24 hr tablet Commonly known as: IMDUR   levothyroxine 50 MCG tablet Commonly known as: SYNTHROID   nitroGLYCERIN 0.4 MG SL tablet Commonly known as: NITROSTAT   pantoprazole 40 MG tablet Commonly known as: PROTONIX   sodium bicarbonate 650 MG tablet   traMADol 50 MG tablet Commonly known as: ULTRAM     TAKE these medications   carvedilol 3.125 MG tablet Commonly known as: COREG Take 1 tablet (3.125 mg total) by mouth 2 (two) times daily with a meal.   torsemide 20 MG tablet Commonly known as: Demadex Take 4 tablets (80 mg total) by mouth 2 (two) times daily.       Allergies  Allergen Reactions  . Kiwi Extract Itching and Other (See Comments)    Throat itching   . Lisinopril Other (See Comments)    Bruising   . Plavix [Clopidogrel] Other (See Comments)    "Made me feel badly- didn't agree with my system"    Consultations:  Cardiology  Infectious disease  Podiatry   Procedures/Studies: DG Foot Complete Left  Result Date: 02/25/2020 CLINICAL DATA:  Bilateral foot wound EXAM: LEFT FOOT - COMPLETE 3+ VIEW COMPARISON:  None. FINDINGS:  Bones appear diffusely demineralized. No subluxation. No soft tissue emphysema. Questionable small fracture deformity at the base of the second proximal phalanx on one view. Possible bony resorptive change at the distal phalanx on the lateral view of what appears to be second digit. IMPRESSION: 1. Possible small fracture deformity at the base of the second proximal phalanx. Correlate for point tenderness. 2. Possible bony resorptive change/osteomyelitis at 1 of the distal phalanges on lateral view, probably second digit. Consider correlation with MRI if clinically able. 3. Diffuse bone demineralization. Electronically Signed   By: Donavan Foil M.D.   On: 02/25/2020 18:36   DG Foot Complete Right  Result Date: 02/25/2020 CLINICAL DATA:  Swelling with wounds EXAM: RIGHT FOOT COMPLETE - 3+ VIEW COMPARISON:  None. FINDINGS: Osseous demineralization. Bony destructive change in fragmentation at the first distal phalanx concerning for osteomyelitis. Foreshortened second digit possibly due to prior partial amputation at the level of the D IP joint, correlate with surgical history. No subluxation. No soft tissue emphysema. Ulcer at the tip of the first digit. Probable ulcer plantar surface of the foot at the level of the metatarsal heads and potential ulcer at the heel pad. IMPRESSION: 1. Bony demineralization with bony destructive change and fragmentation at the first distal phalanx concerning for osteomyelitis. Electronically Signed  By: Donavan Foil M.D.   On: 02/25/2020 18:46   VAS [place] ABI WITH/WO TBI  Result Date: 02/26/2020 LOWER EXTREMITY DOPPLER STUDY Indications: Ischemic ulcers of bilateral lower extremities. Known history of              PVD.  Vascular Interventions: 05-08-2017 Stenting of RT external iliac artery and                         12-2015 stenting of RT SFA. Limitations: Today's exam was limited due to multiple open wounds. Comparison       03-25-2019 Prior ABI w/ TBI RT ABI/TBI was 0.5/0.25  LT was Study:           0.49/0 Performing Technologist: Darlin Coco RDMS  Examination Guidelines: A complete evaluation includes at minimum, Doppler waveform signals and systolic blood pressure reading at the level of bilateral brachial, anterior tibial, and posterior tibial arteries, when vessel segments are accessible. Bilateral testing is considered an integral part of a complete examination. Photoelectric Plethysmograph (PPG) waveforms and toe systolic pressure readings are included as required and additional duplex testing as needed. Limited examinations for reoccurring indications may be performed as noted.  ABI Findings: +---------+------------------+-----+-------------------+-----------------------+ Right    Rt Pressure (mmHg)IndexWaveform           Comment                 +---------+------------------+-----+-------------------+-----------------------+ Brachial 110                    triphasic                                  +---------+------------------+-----+-------------------+-----------------------+ PTA      49                0.45 dampened monophasic                        +---------+------------------+-----+-------------------+-----------------------+ DP       47                0.43 dampened monophasic                        +---------+------------------+-----+-------------------+-----------------------+ Great Toe                                          Unable to assess due to                                                    toe wounds.             +---------+------------------+-----+-------------------+-----------------------+ +---------+------------------+-----+-------------------+-----------------------+ Left     Lt Pressure (mmHg)IndexWaveform           Comment                 +---------+------------------+-----+-------------------+-----------------------+ Brachial                        monophasic         Unable to take pressure  per patient.            +---------+------------------+-----+-------------------+-----------------------+ PTA      44                0.40 dampened monophasic                        +---------+------------------+-----+-------------------+-----------------------+ DP       34                0.31 dampened monophasic                        +---------+------------------+-----+-------------------+-----------------------+ Great Toe                                          Unable to assess due to                                                    toe wounds.             +---------+------------------+-----+-------------------+-----------------------+ +-------+-----------+-----------+------------+------------+ ABI/TBIToday's ABIToday's TBIPrevious ABIPrevious TBI +-------+-----------+-----------+------------+------------+ Right  0.45                  0.50        0.25         +-------+-----------+-----------+------------+------------+ Left   0.40                  0.49        0            +-------+-----------+-----------+------------+------------+ Right ABIs appear decreased compared to prior study on 03-25-2019. Left ABIs appear decreased compared to prior study on 03-25-2019.  Summary: Right: Resting right ankle-brachial index indicates severe right lower extremity arterial disease. Unable to assess TBI secondary to extensive toe wounds. Left: Resting left ankle-brachial index indicates severe left lower extremity arterial disease. Unable to assess TBI secondary to extensive toe wounds.  *See table(s) above for measurements and observations.  Electronically signed by Harold Barban MD on 02/26/2020 at 9:46:35 PM.    Final      Subjective: No issues noted overnight.  Discharge Exam: Vitals:   03/03/20 0016 03/03/20 0507  BP: 112/80 90/63  Pulse: 99 93  Resp: 12 13  Temp: (!) 97.5 F (36.4 C) 97.8 F (36.6 C)  SpO2: 100%    Vitals:    03/02/20 0244 03/02/20 1840 03/03/20 0016 03/03/20 0507  BP: 114/75 113/72 112/80 90/63  Pulse: 94 92 99 93  Resp: 18 20 12 13   Temp: 98 F (36.7 C) 98.1 F (36.7 C) (!) 97.5 F (36.4 C) 97.8 F (36.6 C)  TempSrc: Oral Axillary  Axillary  SpO2: 99% 94% 100%   Weight:      Height:        General: Well appearing, no distress    The results of significant diagnostics from this hospitalization (including imaging, microbiology, ancillary and laboratory) are listed below for reference.     Microbiology: Recent Results (from the past 240 hour(s))  SARS CORONAVIRUS 2 (TAT 6-24 HRS) Nasopharyngeal Nasopharyngeal Swab     Status: None   Collection Time: 02/25/20  5:07 PM   Specimen: Nasopharyngeal Swab  Result Value Ref Range  Status   SARS Coronavirus 2 NEGATIVE NEGATIVE Final    Comment: (NOTE) SARS-CoV-2 target nucleic acids are NOT DETECTED.  The SARS-CoV-2 RNA is generally detectable in upper and lower respiratory specimens during the acute phase of infection. Negative results do not preclude SARS-CoV-2 infection, do not rule out co-infections with other pathogens, and should not be used as the sole basis for treatment or other patient management decisions. Negative results must be combined with clinical observations, patient history, and epidemiological information. The expected result is Negative.  Fact Sheet for Patients: SugarRoll.be  Fact Sheet for Healthcare Providers: https://www.woods-mathews.com/  This test is not yet approved or cleared by the [place] FDA and  has been authorized for detection and/or diagnosis of SARS-CoV-2 by FDA under an Emergency Use Authorization (EUA). This EUA will remain  in effect (meaning this test can be used) for the duration of the COVID-19 declaration under Se ction 564(b)(1) of the Act, 21 U.S.C. section 360bbb-3(b)(1), unless the authorization is terminated or revoked  sooner.  Performed at [facility], [place] [address], [place], [place] [address]      Labs: BNP (last 3 results) Recent Labs    05/22/19 1633 06/27/19 1425  BNP >4,500.0* 0,354.6*   Basic Metabolic Panel: Recent Labs  Lab 02/25/20 1717 02/26/20 0431 02/27/20 0925 02/28/20 0425 02/29/20 1454  NA 131* 132* 134* 137 140  K 3.2* 3.6 3.5 3.0* 3.6  CL 94* 95* 100 99 105  CO2 24 24 24 23  20*  GLUCOSE 204* 151* 112* 93 194*  BUN 67* 67* 67* 64* 63*  CREATININE 4.37* 4.13* 3.66* 3.42* 2.98*  CALCIUM 7.6* 7.3* 7.3* 7.3* 7.9*  PHOS  --   --   --  4.0  --    Liver Function Tests: Recent Labs  Lab 02/25/20 1717 02/28/20 0425  AST 15  --   ALT 7  --   ALKPHOS 120  --   BILITOT 0.7  --   PROT 6.2*  --   ALBUMIN 1.8* 1.3*   No results for input(s): LIPASE, AMYLASE in the last 168 hours. No results for input(s): AMMONIA in the last 168 hours. CBC: Recent Labs  Lab 02/25/20 1717 02/26/20 0431 02/27/20 0925 02/28/20 0425 02/29/20 0339 03/01/20 0331 03/02/20 0412  WBC 9.2   < > 7.4 6.8 6.8 8.3 7.4  NEUTROABS 8.5*  --   --   --   --   --   --   HGB 9.5*   < > 8.3* 8.9* 8.6* 8.8* 8.0*  HCT 28.1*   < > 26.4* 26.6* 26.4* 28.2* 26.0*  MCV 98.6   < > 100.8* 99.3 99.2 101.8* 102.8*  PLT 441*   < > 457* 489* 495* 499* 435*   < > = values in this interval not displayed.   Cardiac Enzymes: No results for input(s): CKTOTAL, CKMB, CKMBINDEX, TROPONINI in the last 168 hours. BNP: Invalid input(s): POCBNP CBG: Recent Labs  Lab 03/01/20 0856 03/01/20 1215 03/01/20 1645 03/01/20 2049 03/02/20 0803  GLUCAP 146* 135* 138* 155* 119*   D-Dimer No results for input(s): DDIMER in the last 72 hours. Hgb A1c No results for input(s): HGBA1C in the last 72 hours. Lipid Profile No results for input(s): CHOL, HDL, LDLCALC, TRIG, CHOLHDL, LDLDIRECT in the last 72 hours. Thyroid function studies No results for input(s): TSH, T4TOTAL, T3FREE, THYROIDAB in the last 72  hours.  Invalid input(s): FREET3 Anemia work up No results for input(s): VITAMINB12, FOLATE, FERRITIN, TIBC,  IRON, RETICCTPCT in the last 72 hours. Urinalysis    Component Value Date/Time   COLORURINE YELLOW 02/27/2020 0650   APPEARANCEUR CLEAR 02/27/2020 0650   LABSPEC 1.011 02/27/2020 0650   PHURINE 5.0 02/27/2020 0650   GLUCOSEU NEGATIVE 02/27/2020 0650   HGBUR NEGATIVE 02/27/2020 0650   BILIRUBINUR NEGATIVE 02/27/2020 0650   BILIRUBINUR negative 11/27/2014 1052   KETONESUR NEGATIVE 02/27/2020 0650   PROTEINUR NEGATIVE 02/27/2020 0650   UROBILINOGEN 0.2 11/27/2014 1052   NITRITE NEGATIVE 02/27/2020 0650   LEUKOCYTESUR NEGATIVE 02/27/2020 0650   Sepsis Labs Invalid input(s): PROCALCITONIN,  WBC,  LACTICIDVEN Microbiology Recent Results (from the past 240 hour(s))  SARS CORONAVIRUS 2 (TAT 6-24 HRS) Nasopharyngeal Nasopharyngeal Swab     Status: None   Collection Time: 02/25/20  5:07 PM   Specimen: Nasopharyngeal Swab  Result Value Ref Range Status   SARS Coronavirus 2 NEGATIVE NEGATIVE Final    Comment: (NOTE) SARS-CoV-2 target nucleic acids are NOT DETECTED.  The SARS-CoV-2 RNA is generally detectable in upper and lower respiratory specimens during the acute phase of infection. Negative results do not preclude SARS-CoV-2 infection, do not rule out co-infections with other pathogens, and should not be used as the sole basis for treatment or other patient management decisions. Negative results must be combined with clinical observations, patient history, and epidemiological information. The expected result is Negative.  Fact Sheet for Patients: SugarRoll.be  Fact Sheet for Healthcare Providers: https://www.woods-mathews.com/  This test is not yet approved or cleared by the [place] FDA and  has been authorized for detection and/or diagnosis of SARS-CoV-2 by FDA under an Emergency Use Authorization (EUA). This EUA will  remain  in effect (meaning this test can be used) for the duration of the COVID-19 declaration under Se ction 564(b)(1) of the Act, 21 U.S.C. section 360bbb-3(b)(1), unless the authorization is terminated or revoked sooner.  Performed at [facility], [place] [address], [place], [place] [address]      Time coordinating discharge: 35 minutes  SIGNED:   Cordelia Poche, MD [facility] 03/03/2020, 10:12 AM

## 2020-04-10 DEATH — deceased

## 2020-08-30 IMAGING — RF DG ESOPHAGUS
8 series · 24 of 24 positions shown · non-contrast
Comparison: CT chest from 10/25/2017

CLINICAL DATA: Odynophagia. History of colon cancer with lower
anterior resection.

EXAM:
ESOPHOGRAM/BARIUM SWALLOW
TECHNIQUE: Combined double contrast and single contrast examination performed
using effervescent crystals, thick barium liquid, and thin barium
liquid.
FLUOROSCOPY TIME:  Fluoroscopy Time:  2 minutes, 12 seconds
Radiation Exposure Index (if provided by the fluoroscopic device):
9.7 mGy
Number of Acquired Spot Images: 0

[Series 1: cp_standard · 0.35mm/px · 3 of 139 frames shown (1 of 8)]
[frame 14/139]
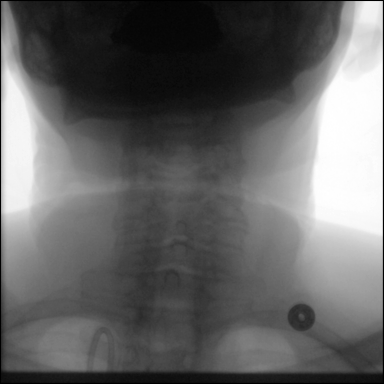
[frame 21/139]
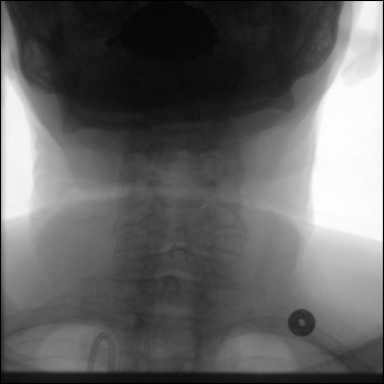
[frame 119/139]
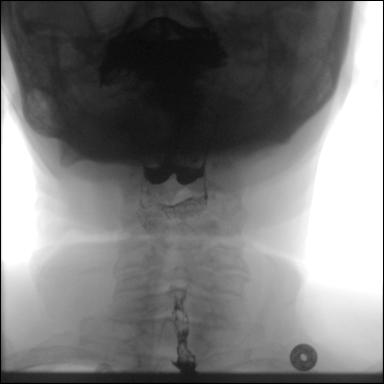

[Series 2: cp_standard · 0.35mm/px · 3 of 87 frames shown (2 of 8)]
[frame 10/87]
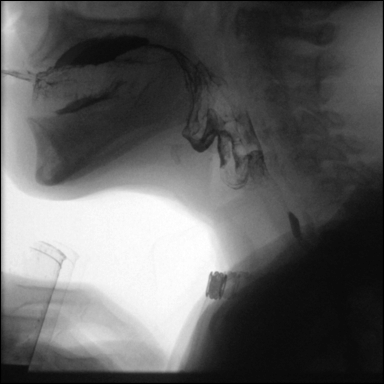
[frame 14/87]
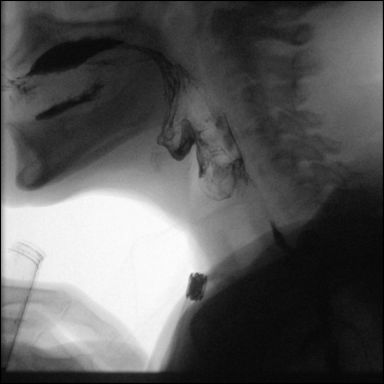
[frame 74/87]
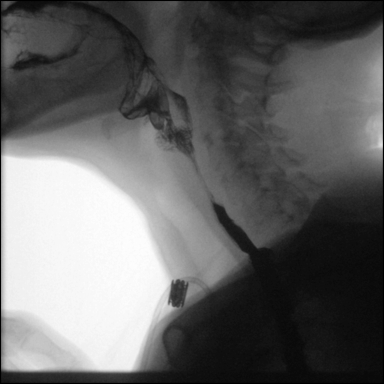

[Series 3: cp_standard · 0.35mm/px · 3 of 111 frames shown (3 of 8)]
[frame 4/111]
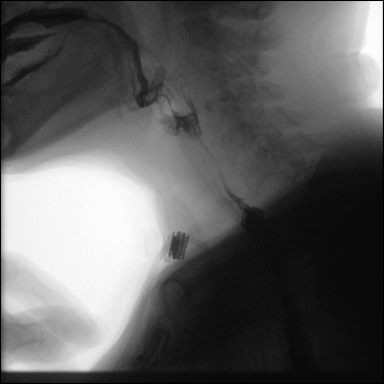
[frame 17/111]
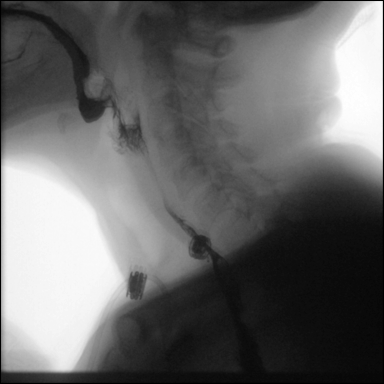
[frame 56/111]
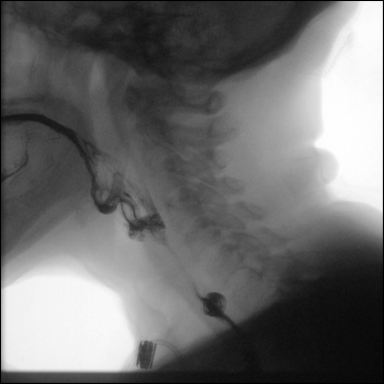

[Series 4: cp_standard · 0.35mm/px · 3 of 135 frames shown (4 of 8)]
[frame 8/135]
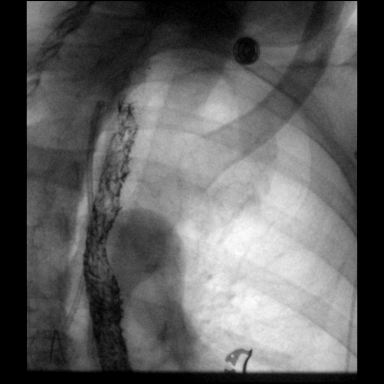
[frame 21/135]
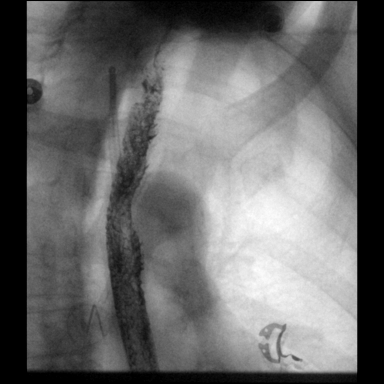
[frame 68/135]
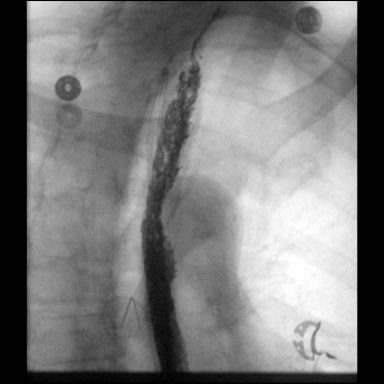

[Series 5: cp_standard · 0.35mm/px · 3 of 20 frames shown (5 of 8)]
[frame 2/20]
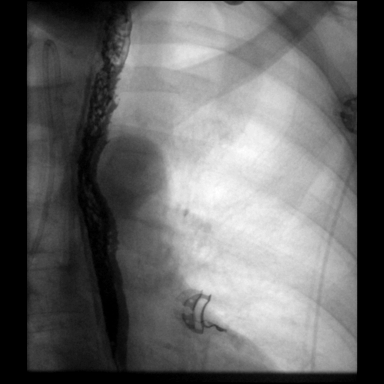
[frame 4/20]
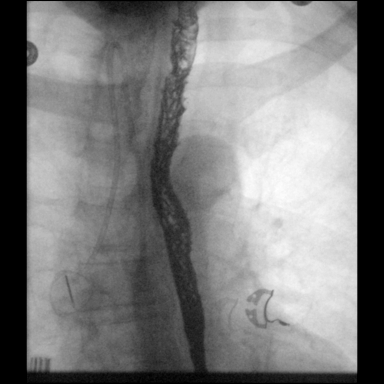
[frame 11/20]
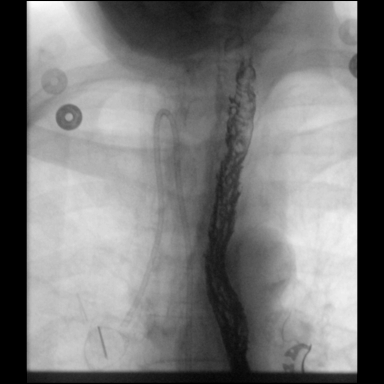

[Series 6: cp_standard · 0.35mm/px · 4 of 33 frames shown (6 of 8)]
[frame 5/33]
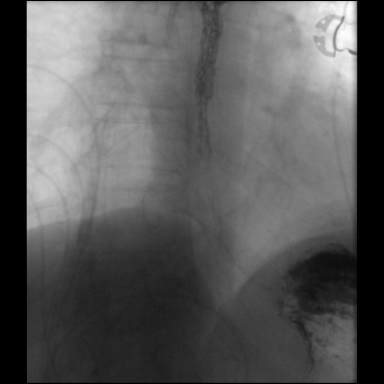
[frame 17/33]
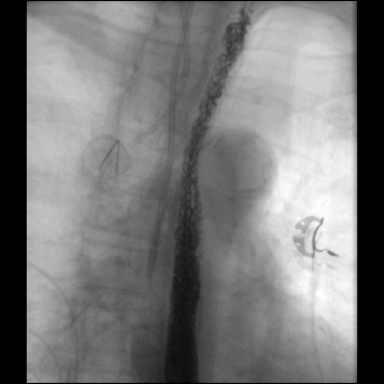
[frame 29/33]
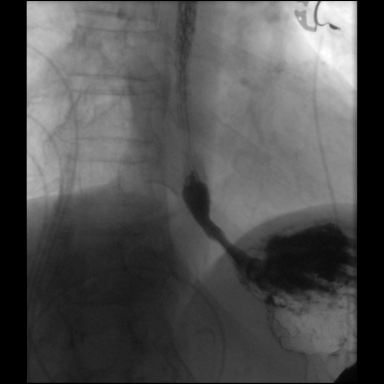
[frame 33/33]
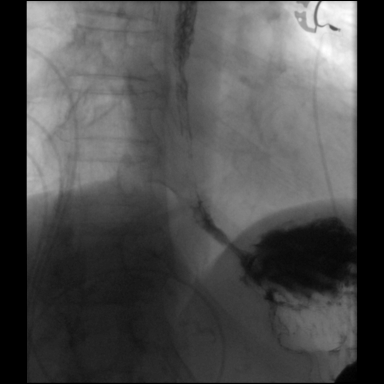

[Series 7: cp_standard · 0.35mm/px · 3 of 45 frames shown (7 of 8)]
[frame 23/45]
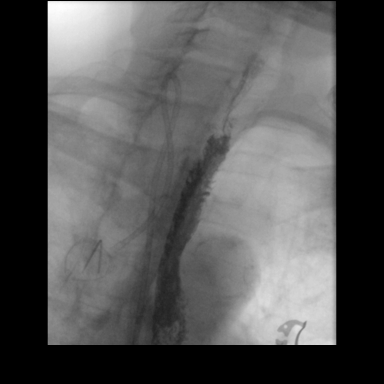
[frame 39/45]
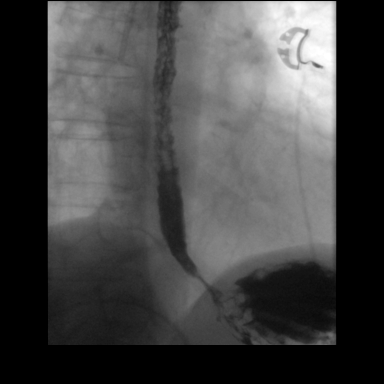
[frame 45/45]
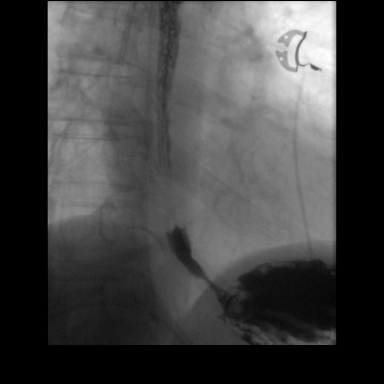

[Series 8: cp_standard · 0.35mm/px · 2 of 37 frames shown (8 of 8)]
[frame 19/37]
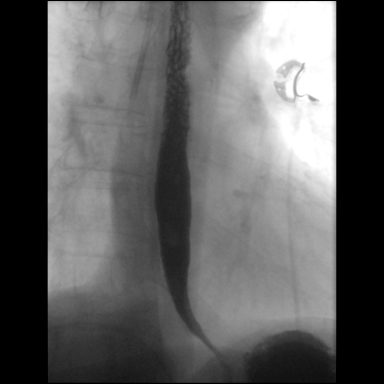
[frame 32/37]
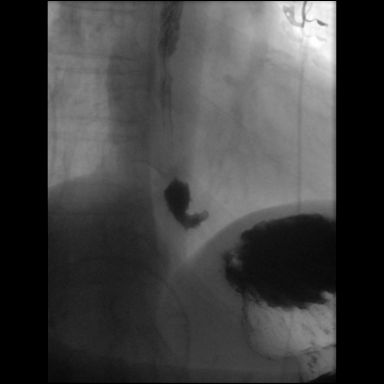

[24 of 24 positions shown; findings below may reference images not displayed]

FINDINGS: The patient is somewhat infirm and slow to move. I judged that with
minor assistance she would be able to stand for example for the
pharyngeal phase images and for double contrast images, but she did
not have ideal mobility for the exam, and also refused to swallow
the barium pill. We performed LPO rather than RAO images as I was
skeptical she would be able to comfortably lay on her belly.

On the pharyngeal phase of swallowing the patient retained barium in
the mouth and only swallowed boluses of the oral barium at a time.
There is an episode of laryngeal penetration, and also some mild
vallecular stasis

Starting in the lowest portion of the cervical esophagus but
primarily involving the thoracic esophagus, we demonstrate shaggy
nodular irregularity of the esophageal wall for example as shown on
image [DATE]. This has an appearance suggesting small marginal
ulcerations and plaque-like lesions. There some sparing of the
distal 25% of the thoracic esophagus.

Nodularity in the distal esophagus is observed for example on image
33/8, which could be from polyp, ulceration, or a small mass. The
filling defect measures about 7 by 5 mm on image 32/8. However, this
was somewhat transient.

Because the patient took small swallows and possibly due to
intrinsic disease, the distal esophagus and gastroesophageal
junction remain narrow. I was only able to distend these regions up
to about 0.5 cm. Because the patient refused the barium pill I was
unable to challenge the region with a larger non liquid bolus.

The primary peristaltic waves in the esophagus were not disrupted.
IMPRESSION: 1. Extensive esophagitis with small ulcerations and plaque-like
lesions extending from the lower cervical esophagus to the lower
thoracic esophagus. Top differential diagnostic considerations for
this distribution and appearance include Candida esophagitis,
cytomegalovirus esophagitis, herpes esophagitis, or glycogenic
acanthosis.
2. Considerable narrowing of the distal esophagus. On series 8 there
was a transient filling defect in this vicinity which may been
incidental but could be from a varix or possibly even a mass.
Because of the relatively small boluses the patient was able to
swallow, I am not confident that I was able to fully fluid distend
the distal esophagus, the patient was unable to swallow a barium
pill. The possibility of a distal esophageal mass is not totally
excluded, and after treatment for the patient's presumed
esophagitis, consider either endoscopy or repeat esophagram to
reassess the distal esophagus.
